# Patient Record
Sex: Female | Born: 1960 | Race: Black or African American | Hispanic: No | Marital: Single | Smoking: Current some day smoker
Health system: Southern US, Community
[De-identification: ages and names within clinical notes are randomized; demographics above are authoritative.]

## PROBLEM LIST (undated history)

## (undated) DIAGNOSIS — K429 Umbilical hernia without obstruction or gangrene: Secondary | ICD-10-CM

## (undated) DIAGNOSIS — Z860100 Personal history of colon polyps, unspecified: Secondary | ICD-10-CM

## (undated) DIAGNOSIS — F32A Depression, unspecified: Secondary | ICD-10-CM

## (undated) DIAGNOSIS — K2980 Duodenitis without bleeding: Secondary | ICD-10-CM

## (undated) DIAGNOSIS — G893 Neoplasm related pain (acute) (chronic): Secondary | ICD-10-CM

## (undated) DIAGNOSIS — Z809 Family history of malignant neoplasm, unspecified: Secondary | ICD-10-CM

## (undated) DIAGNOSIS — N390 Urinary tract infection, site not specified: Secondary | ICD-10-CM

## (undated) DIAGNOSIS — I10 Essential (primary) hypertension: Secondary | ICD-10-CM

## (undated) DIAGNOSIS — E7800 Pure hypercholesterolemia, unspecified: Secondary | ICD-10-CM

## (undated) DIAGNOSIS — R7303 Prediabetes: Secondary | ICD-10-CM

## (undated) DIAGNOSIS — C801 Malignant (primary) neoplasm, unspecified: Secondary | ICD-10-CM

## (undated) DIAGNOSIS — R0600 Dyspnea, unspecified: Secondary | ICD-10-CM

## (undated) HISTORY — DX: Personal history of colonic polyps: Z86.010

## (undated) HISTORY — PX: LUNG LOBECTOMY: SHX167

## (undated) HISTORY — DX: Neoplasm related pain (acute) (chronic): G89.3

## (undated) HISTORY — DX: Personal history of colon polyps, unspecified: Z86.0100

## (undated) HISTORY — DX: Family history of malignant neoplasm, unspecified: Z80.9

---
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
## 2004-08-10 ENCOUNTER — Emergency Department: Payer: Self-pay | Admitting: Emergency Medicine

## 2004-09-20 ENCOUNTER — Observation Stay: Payer: Self-pay | Admitting: Obstetrics and Gynecology

## 2011-02-24 ENCOUNTER — Emergency Department: Payer: Self-pay | Admitting: Emergency Medicine

## 2011-11-04 ENCOUNTER — Emergency Department: Payer: Self-pay | Admitting: Unknown Physician Specialty

## 2012-06-14 ENCOUNTER — Emergency Department: Payer: Self-pay | Admitting: Emergency Medicine

## 2015-03-05 ENCOUNTER — Emergency Department
Admission: EM | Admit: 2015-03-05 | Discharge: 2015-03-05 | Disposition: A | Discharge status: Home / Self Care | Payer: Self-pay | Attending: Emergency Medicine | Admitting: Emergency Medicine

## 2015-03-05 ENCOUNTER — Encounter: Payer: Self-pay | Admitting: *Deleted

## 2015-03-05 DIAGNOSIS — Z720 Tobacco use: Secondary | ICD-10-CM | POA: Insufficient documentation

## 2015-03-05 DIAGNOSIS — L02416 Cutaneous abscess of left lower limb: Secondary | ICD-10-CM | POA: Insufficient documentation

## 2015-03-05 DIAGNOSIS — R300 Dysuria: Secondary | ICD-10-CM | POA: Insufficient documentation

## 2015-03-05 LAB — URINALYSIS COMPLETE WITH MICROSCOPIC (ARMC ONLY)
Bacteria, UA: NONE SEEN
Bilirubin Urine: NEGATIVE
Glucose, UA: NEGATIVE mg/dL
Ketones, ur: NEGATIVE mg/dL
Nitrite: NEGATIVE
Protein, ur: NEGATIVE mg/dL
Specific Gravity, Urine: 1.027 (ref 1.005–1.030)
pH: 5 (ref 5.0–8.0)

## 2015-03-05 MED ORDER — SULFAMETHOXAZOLE-TRIMETHOPRIM 800-160 MG PO TABS
ORAL_TABLET | ORAL | Status: AC
  Filled 2015-03-05: qty 1

## 2015-03-05 MED ORDER — PHENAZOPYRIDINE HCL 200 MG PO TABS: 200.0000 mg | ORAL_TABLET | Freq: Three times a day (TID) | ORAL | Status: DC | PRN

## 2015-03-05 MED ORDER — PHENAZOPYRIDINE HCL 100 MG PO TABS
95.0000 mg | ORAL_TABLET | Freq: Once | ORAL | Status: AC
  Administered 2015-03-05: 100 mg via ORAL

## 2015-03-05 MED ORDER — IBUPROFEN 800 MG PO TABS
800.0000 mg | ORAL_TABLET | Freq: Once | ORAL | Status: AC
  Administered 2015-03-05: 800 mg via ORAL

## 2015-03-05 MED ORDER — IBUPROFEN 800 MG PO TABS
ORAL_TABLET | ORAL | Status: AC
  Filled 2015-03-05: qty 1

## 2015-03-05 MED ORDER — PHENAZOPYRIDINE HCL 200 MG PO TABS
ORAL_TABLET | ORAL | Status: AC
  Filled 2015-03-05: qty 1

## 2015-03-05 MED ORDER — BACITRACIN ZINC 500 UNIT/GM EX OINT: TOPICAL_OINTMENT | CUTANEOUS | Status: DC

## 2015-03-05 MED ORDER — SULFAMETHOXAZOLE-TRIMETHOPRIM 800-160 MG PO TABS
1.0000 | ORAL_TABLET | Freq: Once | ORAL | Status: AC
  Administered 2015-03-05: 1 via ORAL

## 2015-03-05 MED ORDER — IBUPROFEN 800 MG PO TABS: 800.0000 mg | ORAL_TABLET | Freq: Three times a day (TID) | ORAL | Status: DC | PRN

## 2015-03-05 MED ORDER — SULFAMETHOXAZOLE-TRIMETHOPRIM 800-160 MG PO TABS: 1.0000 | ORAL_TABLET | Freq: Two times a day (BID) | ORAL | Status: DC

## 2015-03-05 NOTE — ED Provider Notes (Signed)
CSN: [id number]     Arrival date & time 03/05/15  1421 History   First MD Initiated Contact with Patient 03/05/15 1452     Chief Complaint  Patient presents with  . Dysuria  . Abscess     (Consider location/radiation/quality/duration/timing/severity/associated sxs/prior Treatment) HPI Patient presents with abscess to left shin she first noticed approximately 3 days ago said it spontaneously ruptured and drained not areas are red and tender denies fevers or chills states that she is also experiencing some burning with urination that is symptomatic of previous urinary tract infections that she has had denies any vaginal bleeding vaginal discharge or pelvic pain flank pain overall rates her pain as about a 5 out of 10 worse when she touches her shin and has no other complaints at this time History reviewed. No pertinent past medical history. History reviewed. No pertinent past surgical history. History reviewed. No pertinent family history. History  Substance Use Topics  . Smoking status: Current Every Day Smoker  . Smokeless tobacco: Not on file  . Alcohol Use: Not on file   OB History    No data available     Review of Systems  Constitutional: Negative.   HENT: Negative.   Eyes: Negative.   Respiratory: Negative.   Cardiovascular: Negative.   Gastrointestinal: Negative.   Genitourinary: Positive for dysuria, urgency, frequency and difficulty urinating. Negative for hematuria, flank pain, vaginal bleeding, vaginal discharge, vaginal pain, menstrual problem and pelvic pain.  Musculoskeletal: Negative.   Skin: Positive for wound.  Neurological: Negative.   All other systems reviewed and are negative.      Allergies  Review of patient's allergies indicates no known allergies.  Home Medications   Prior to Admission medications   Medication Sig Start Date End Date Taking? Authorizing Provider  bacitracin ointment Apply to affected area daily 03/05/15 03/04/16  Emogene Muratalla William C  Kaion Tisdale, PA-C  ibuprofen (ADVIL,MOTRIN) 800 MG tablet Take 1 tablet (800 mg total) by mouth every 8 (eight) hours as needed. 03/05/15   Emmilee Reamer Luanna Cole Arlyne Brandes, PA-C  phenazopyridine (PYRIDIUM) 200 MG tablet Take 1 tablet (200 mg total) by mouth 3 (three) times daily as needed for pain. 03/05/15 03/04/16  Fidencio Duddy William C Lovely Kerins, PA-C  sulfamethoxazole-trimethoprim (BACTRIM DS,SEPTRA DS) 800-160 MG per tablet Take 1 tablet by mouth 2 (two) times daily. 03/05/15   Rocco Kerkhoff William C Shonn Farruggia, PA-C   BP 148/83 mmHg  Pulse 95  Temp(Src) 98.8 F (37.1 C) (Oral)  Resp 18  Ht '5\' 8"'$  (1.727 m)  Wt 150 lb (68.04 kg)  BMI 22.81 kg/m2  SpO2 99% Physical Exam Female whose appearing stated age well-developed well-nourished no acute distress Vitals reviewed Head ears eyes nose neck and throat exam in this patient unremarkable Cardiovascular regular rate and rhythm no murmurs rubs gallops Pulmonary lungs clear to auscultation bilaterally Abdomen soft flat nontender bowel sounds positive in all 4 quadrants Musculoskeletal moving all extremities without difficulty Neuro exam is nonfocal cranial nerves II through XII grossly intact good sensation throughout distal extremities Skin patient has a soft tissue lesion lateral aspect of her left shin with mild erythema no active drainage mild induration no fluctuance ED Course  Procedures (including critical care time) Labs Review Labs Reviewed  URINALYSIS COMPLETEWITH MICROSCOPIC  Hospital)  - Abnormal; Notable for the following:    Color, Urine YELLOW (*)    APPearance CLEAR (*)    Hgb urine dipstick 2+ (*)    Leukocytes, UA 1+ (*)    Squamous Epithelial /  LPF 0-5 (*)    All other components within normal limits  URINE CULTURE      MDM  Patient has an abscess that has spontaneously ruptured to her left shin will start her on Bactrim she was also complaining of dysuria Bactrim should generally cover if she has a urinary tract infection though we did send her  urine for culture and started her on Pyridium and Motrin also she is to return to 3 days as needed for a wound check if not improving return immediately for any acute concerns or worsening symptoms Final diagnoses:  Abscess of leg, left  Dysuria       Bentlee Benningfield Verdene Rio, PA-C 03/05/15 1544  Orbie Pyo, MD 03/05/15 2358

## 2015-03-05 NOTE — ED Notes (Signed)
Pt arrives with quarter size abseces on her left shin with pus, pt states she noticied it 3 days ago, pt also states burning upon urination

## 2015-03-05 NOTE — Discharge Instructions (Signed)

## 2015-03-07 LAB — URINE CULTURE: Culture: NO GROWTH

## 2015-03-13 ENCOUNTER — Encounter: Payer: Self-pay | Admitting: Emergency Medicine

## 2015-03-13 ENCOUNTER — Emergency Department
Admission: EM | Admit: 2015-03-13 | Discharge: 2015-03-13 | Disposition: A | Discharge status: Home / Self Care | Payer: Self-pay | Attending: Emergency Medicine | Admitting: Emergency Medicine

## 2015-03-13 DIAGNOSIS — Z720 Tobacco use: Secondary | ICD-10-CM | POA: Insufficient documentation

## 2015-03-13 DIAGNOSIS — T814XXD Infection following a procedure, subsequent encounter: Secondary | ICD-10-CM

## 2015-03-13 DIAGNOSIS — L02416 Cutaneous abscess of left lower limb: Secondary | ICD-10-CM | POA: Insufficient documentation

## 2015-03-13 DIAGNOSIS — IMO0001 Reserved for inherently not codable concepts without codable children: Secondary | ICD-10-CM

## 2015-03-13 MED ORDER — DOXYCYCLINE HYCLATE 100 MG PO CAPS: 100.0000 mg | ORAL_CAPSULE | Freq: Two times a day (BID) | ORAL | Status: AC

## 2015-03-13 NOTE — ED Provider Notes (Signed)
CSN: [id number]     Arrival date & time 03/13/15  1102 History   First MD Initiated Contact with Patient 03/13/15 1147     Chief Complaint  Patient presents with  . Wound Check     HPI Comments: 54 year old female presents today complaining of left leg wound. She states that she was seen here on May 25 for the same complaint and prescribed Bactrim. She has been taking this without relief. At one point there was purulent drainage from the wound. No fevers, sweats or chills.   Patient is a 54 y.o. female presenting with wound check. The history is provided by the patient.  Wound Check The current episode started in the past 7 days. The problem occurs constantly. The problem has been unchanged. Pertinent negatives include no arthralgias, chills, fatigue, joint swelling or myalgias. The symptoms are aggravated by walking. She has tried rest (antibiotic cream) for the symptoms. The treatment provided no relief.    History reviewed. No pertinent past medical history. History reviewed. No pertinent past surgical history. No family history on file. History  Substance Use Topics  . Smoking status: Current Every Day Smoker  . Smokeless tobacco: Not on file  . Alcohol Use: Yes     Comment: occssional   OB History    No data available     Review of Systems  Constitutional: Negative for chills and fatigue.  Musculoskeletal: Negative for myalgias, joint swelling and arthralgias.  Skin: Positive for wound.  All other systems reviewed and are negative.     Allergies  Review of patient's allergies indicates no known allergies.  Home Medications   Prior to Admission medications   Medication Sig Start Date End Date Taking? Authorizing Provider  doxycycline (VIBRAMYCIN) 100 MG capsule Take 1 capsule (100 mg total) by mouth 2 (two) times daily. 03/13/15 03/23/15  Shayne Alken V, PA-C   BP 113/67 mmHg  Pulse 72  Temp(Src) 98.1 F (36.7 C) (Oral)  Resp 18  Ht '5\' 7"'$  (1.702 m)  Wt 150 lb  (68.04 kg)  BMI 23.49 kg/m2  SpO2 98% Physical Exam  Constitutional: She is oriented to person, place, and time. Vital signs are normal. She appears well-developed and well-nourished.  HENT:  Head: Normocephalic and atraumatic.  Musculoskeletal: Normal range of motion.  Neurological: She is alert and oriented to person, place, and time.  Skin:  Open wound overlying the left anterior tibial spine. It is approximately 2 cm in diameter. There is a small amount of drainage from the wound.   Psychiatric: She has a normal mood and affect. Her behavior is normal. Judgment and thought content normal.  Nursing note and vitals reviewed.   ED Course  Procedures (including critical care time) I packed the wound with iodoform gauze and covered with sterile gauze.  Labs Review Labs Reviewed - No data to display  Imaging Review No results found.   EKG Interpretation None      MDM  I packed the wound as above. I advised the patient to continue packing this each day. If it does not heal she should come back in the next week. She should also return if it is worsening. Stop bactrim, start doxycycline. Advised of potential for photosensitivity with this medication.  Final diagnoses:  Wound abscess, subsequent encounter        Corliss Parish, PA-C 03/13/15 1415  Harvest Dark, MD 03/13/15 [phone]

## 2015-03-13 NOTE — Discharge Instructions (Signed)
Pack wound with antibiotic gauze and change dressing once each day STOP taking bactrim, start doxycycline Return to ER if wound is not getting better, or it seems to be getting worse

## 2015-03-13 NOTE — ED Notes (Signed)
Pt informed to return if any life threatening symptoms occur.  

## 2015-03-13 NOTE — ED Notes (Signed)
Here for wound   Was seen last week

## 2015-03-14 ENCOUNTER — Telehealth: Payer: Self-pay | Admitting: Emergency Medicine

## 2017-08-11 ENCOUNTER — Emergency Department
Admission: EM | Admit: 2017-08-11 | Discharge: 2017-08-11 | Disposition: A | Discharge status: Home / Self Care | Payer: 59 | Attending: Emergency Medicine | Admitting: Emergency Medicine

## 2017-08-11 ENCOUNTER — Encounter: Payer: Self-pay | Admitting: Emergency Medicine

## 2017-08-11 DIAGNOSIS — Y999 Unspecified external cause status: Secondary | ICD-10-CM | POA: Diagnosis not present

## 2017-08-11 DIAGNOSIS — K0889 Other specified disorders of teeth and supporting structures: Secondary | ICD-10-CM | POA: Diagnosis present

## 2017-08-11 DIAGNOSIS — K0510 Chronic gingivitis, plaque induced: Secondary | ICD-10-CM | POA: Diagnosis not present

## 2017-08-11 DIAGNOSIS — Y939 Activity, unspecified: Secondary | ICD-10-CM | POA: Insufficient documentation

## 2017-08-11 DIAGNOSIS — Y929 Unspecified place or not applicable: Secondary | ICD-10-CM | POA: Insufficient documentation

## 2017-08-11 DIAGNOSIS — X58XXXA Exposure to other specified factors, initial encounter: Secondary | ICD-10-CM | POA: Insufficient documentation

## 2017-08-11 DIAGNOSIS — K029 Dental caries, unspecified: Secondary | ICD-10-CM

## 2017-08-11 DIAGNOSIS — F17200 Nicotine dependence, unspecified, uncomplicated: Secondary | ICD-10-CM | POA: Insufficient documentation

## 2017-08-11 DIAGNOSIS — S025XXA Fracture of tooth (traumatic), initial encounter for closed fracture: Secondary | ICD-10-CM

## 2017-08-11 DIAGNOSIS — K047 Periapical abscess without sinus: Secondary | ICD-10-CM

## 2017-08-11 MED ORDER — LIDOCAINE VISCOUS 2 % MT SOLN
15.0000 mL | Freq: Once | OROMUCOSAL | Status: AC
  Administered 2017-08-11: 15 mL via OROMUCOSAL
  Filled 2017-08-11: qty 15

## 2017-08-11 MED ORDER — LIDOCAINE VISCOUS 2 % MT SOLN: 5.0000 mL | Freq: Four times a day (QID) | OROMUCOSAL | 0 refills | Status: DC | PRN

## 2017-08-11 MED ORDER — HYDROXYZINE HCL 50 MG PO TABS: 50.0000 mg | ORAL_TABLET | Freq: Three times a day (TID) | ORAL | 0 refills | Status: DC | PRN

## 2017-08-11 MED ORDER — TRAMADOL HCL 50 MG PO TABS: 50.0000 mg | ORAL_TABLET | Freq: Four times a day (QID) | ORAL | 0 refills | Status: DC | PRN

## 2017-08-11 MED ORDER — AMOXICILLIN 500 MG PO CAPS: 500.0000 mg | ORAL_CAPSULE | Freq: Three times a day (TID) | ORAL | 0 refills | Status: DC

## 2017-08-11 NOTE — Discharge Instructions (Signed)
Following that were treating dentist for extraction.

## 2017-08-11 NOTE — ED Provider Notes (Signed)
[facility] Emergency Department Provider Note   ____________________________________________   First MD Initiated Contact with Patient 08/11/17 1149     (approximate)  I have reviewed the triage vital signs and the nursing notes.   HISTORY  Chief Complaint Oral Swelling and Dental Pain    HPI Stacie Kennedy is a 56 y.o. female patient complaining of dental pain and upper lip swelling for 2 days. Patient states he was revived by the dentist 2 days ago but deferred extraction secondary to lack of funds. Patient states that is post examination she went home and noticed upper lip swelling. Patient also states decreased redness in her gums and increased teeth pain.Patient rates pain as a 5/10. Patient has fever associated this complaint. No palliative measures for this complaint.   History reviewed. No pertinent past medical history.  There are no active problems to display for this patient.   History reviewed. No pertinent surgical history.  Prior to Admission medications   Medication Sig Start Date End Date Taking? Authorizing Provider  amoxicillin (AMOXIL) 500 MG capsule Take 1 capsule (500 mg total) by mouth 3 (three) times daily. 08/11/17   Sable Feil, PA-C  hydrOXYzine (ATARAX/VISTARIL) 50 MG tablet Take 1 tablet (50 mg total) by mouth 3 (three) times daily as needed for itching. 08/11/17   Sable Feil, PA-C  lidocaine (XYLOCAINE) 2 % solution Use as directed 5 mLs in the mouth or throat every 6 (six) hours as needed for mouth pain. 08/11/17   Sable Feil, PA-C  traMADol (ULTRAM) 50 MG tablet Take 1 tablet (50 mg total) by mouth every 6 (six) hours as needed. 08/11/17 08/11/18  Sable Feil, PA-C    Allergies Patient has no known allergies.  History reviewed. No pertinent family history.  Social History Social History  Substance Use Topics  . Smoking status: Current Every Day Smoker  . Smokeless tobacco: Never Used  . Alcohol use  Yes     Comment: occssional    Review of Systems Constitutional: No fever/chills Eyes: No visual changes. ENT: No sore throat. Dental pain  Cardiovascular: Denies chest pain. Respiratory: Denies shortness of breath. Gastrointestinal: No abdominal pain.  No nausea, no vomiting.  No diarrhea.  No constipation. Genitourinary: Negative for dysuria. Musculoskeletal: Negative for back pain. Skin: Negative for rash. Neurological: Negative for headaches, focal weakness or numbness.   ____________________________________________   PHYSICAL EXAM:  VITAL SIGNS: ED Triage Vitals [08/11/17 1129]  Enc Vitals Group     BP (!) 148/71     Pulse Rate 72     Resp 18     Temp 97.7 F (36.5 C)     Temp Source Oral     SpO2 96 %     Weight 130 lb (59 kg)     Height 5\' 7"  (1.702 m)     Head Circumference      Peak Flow      Pain Score      Pain Loc      Pain Edu?      Excl. in [place]?     Constitutional: Alert and oriented. Well appearing and in no acute distress. Mouth/Throat: Mucous membranes are moist.  Oropharynx non-erythematous.Edema and erythema to the upper gingiva.multiple caries anterior upper incisors. Devitalized (sizes. Neck: No stridor.   Cardiovascular: Normal rate, regular rhythm. Grossly normal heart sounds.  Good peripheral circulation. Respiratory: Normal respiratory effort.  No retractions. Lungs CTAB. Neurologic:  Normal speech and language. No gross  focal neurologic deficits are appreciated. No gait instability. Skin:  Skin is warm, dry and intact. No rash noted. Psychiatric: Mood and affect are normal. Speech and behavior are normal.  ____________________________________________   LABS (all labs ordered are listed, but only abnormal results are displayed)  Labs Reviewed - No data to display ____________________________________________  EKG   ____________________________________________  RADIOLOGY  No results  found.  ____________________________________________   PROCEDURES  Procedure(s) performed: None  Procedures  Critical Care performed: No  ____________________________________________   INITIAL IMPRESSION / ASSESSMENT AND PLAN / ED COURSE  As part of my medical decision making, I reviewed the following data within the [facility]    Today secondary to gingivitis and devitalized upper incisors. Patient given discharge care instruction. Patient advised follow with treating dentist for extraction. Patient was take medication as directed. Patient given a work note.       ____________________________________________   FINAL CLINICAL IMPRESSION(S) / ED DIAGNOSES  Final diagnoses:  Infected dental caries  Closed fracture of tooth, initial encounter  Gingivitis      NEW MEDICATIONS STARTED DURING THIS VISIT:  New Prescriptions   AMOXICILLIN (AMOXIL) 500 MG CAPSULE    Take 1 capsule (500 mg total) by mouth 3 (three) times daily.   HYDROXYZINE (ATARAX/VISTARIL) 50 MG TABLET    Take 1 tablet (50 mg total) by mouth 3 (three) times daily as needed for itching.   LIDOCAINE (XYLOCAINE) 2 % SOLUTION    Use as directed 5 mLs in the mouth or throat every 6 (six) hours as needed for mouth pain.   TRAMADOL (ULTRAM) 50 MG TABLET    Take 1 tablet (50 mg total) by mouth every 6 (six) hours as needed.     Note:  This document was prepared using Dragon voice recognition software and may include unintentional dictation errors.    Sable Feil, PA-C 08/11/17 1202    Eula Listen, MD 08/11/17 1525

## 2017-08-11 NOTE — ED Triage Notes (Signed)
Pt has mild swelling upper lip X 2 days. Has not gotten worse. Saw dentist for problems with front upper tooth Tuesday and he told her take motrin but then started having swelling. Handling secretions. No respiratory distress.  Increased pain to tooth. Pt thinks may need abx for tooth.

## 2017-08-11 NOTE — ED Notes (Signed)
Pt reports swellign to upper lip, states she went to the dentist on Tuesday and was prescribed pain medications. Pt states the pain is not getting any better and reports burning in her upper mouth.

## 2018-04-03 ENCOUNTER — Emergency Department
Admission: EM | Admit: 2018-04-03 | Discharge: 2018-04-03 | Disposition: A | Discharge status: Home / Self Care | Payer: 59 | Attending: Emergency Medicine | Admitting: Emergency Medicine

## 2018-04-03 ENCOUNTER — Other Ambulatory Visit: Payer: Self-pay

## 2018-04-03 ENCOUNTER — Encounter: Payer: Self-pay | Admitting: Emergency Medicine

## 2018-04-03 DIAGNOSIS — F17200 Nicotine dependence, unspecified, uncomplicated: Secondary | ICD-10-CM | POA: Diagnosis not present

## 2018-04-03 DIAGNOSIS — K047 Periapical abscess without sinus: Secondary | ICD-10-CM | POA: Diagnosis not present

## 2018-04-03 DIAGNOSIS — R220 Localized swelling, mass and lump, head: Secondary | ICD-10-CM | POA: Diagnosis present

## 2018-04-03 MED ORDER — NAPROXEN 500 MG PO TABS: 500.0000 mg | ORAL_TABLET | Freq: Two times a day (BID) | ORAL | 0 refills | Status: DC

## 2018-04-03 MED ORDER — CLINDAMYCIN HCL 150 MG PO CAPS: ORAL_CAPSULE | ORAL | 0 refills | Status: DC

## 2018-04-03 NOTE — ED Notes (Signed)
See triage note   Presents with swelling to lips  States she did have dental pain couple of days ago   Not sure if lip swelling is coming from tooth pain  No diff swallowing   Denies any recent meds or new foods

## 2018-04-03 NOTE — Discharge Instructions (Addendum)
Follow-up with your dentist for future dental procedures.  Begin taking clindamycin for the next 7 days.  You must finish the antibiotic completely.  Also naproxen 500 mg twice daily with food.  Discontinue taking the amoxicillin.

## 2018-04-03 NOTE — ED Provider Notes (Signed)
[facility] Emergency Department Provider Note   ____________________________________________   First MD Initiated Contact with Patient 04/03/18 1329     (approximate)  I have reviewed the triage vital signs and the nursing notes.   HISTORY  Chief Complaint lips swelling  HPI Stacie Kennedy is a 57 y.o. female is here with complaint of upper lip swelling.  Patient denies any injury.  She attributes this to a dental problem that she has.  Patient has undergone dental procedures and plans to schedule more extractions by the end of the year.  She denies any fever or chills.  Patient states that the tooth in this area has been chipped for approximately 2 years.  Patient smokes half pack of cigarettes daily.  Patient began taking leftover amoxicillin last evening.   History reviewed. No pertinent past medical history.  There are no active problems to display for this patient.   History reviewed. No pertinent surgical history.  Prior to Admission medications   Medication Sig Start Date End Date Taking? Authorizing Provider  clindamycin (CLEOCIN) 150 MG capsule 1 tablet qid x 7days 04/03/18   Letitia Neri L, PA-C  naproxen (NAPROSYN) 500 MG tablet Take 1 tablet (500 mg total) by mouth 2 (two) times daily with a meal. 04/03/18   Johnn Hai, PA-C    Allergies Patient has no known allergies.  No family history on file.  Social History Social History   Tobacco Use  . Smoking status: Current Every Day Smoker  . Smokeless tobacco: Never Used  Substance Use Topics  . Alcohol use: Yes    Comment: occssional  . Drug use: No    Review of Systems Constitutional: No fever/chills Eyes: No visual changes. ENT: Positive dental pain. Cardiovascular: Denies chest pain. Respiratory: Denies shortness of breath. Musculoskeletal: Negative for back pain. Skin: Right upper lip edema. Neurological: Negative for headaches, focal weakness or  numbness. ____________________________________________   PHYSICAL EXAM:  VITAL SIGNS: ED Triage Vitals  Enc Vitals Group     BP 04/03/18 1226 132/83     Pulse Rate 04/03/18 1226 71     Resp 04/03/18 1226 18     Temp 04/03/18 1226 98.5 F (36.9 C)     Temp Source 04/03/18 1226 Oral     SpO2 04/03/18 1226 95 %     Weight 04/03/18 1237 135 lb (61.2 kg)     Height 04/03/18 1237 5' 6.5" (1.689 m)     Head Circumference --      Peak Flow --      Pain Score 04/03/18 1237 0     Pain Loc --      Pain Edu? --      Excl. in [place]? --    Constitutional: Alert and oriented. Well appearing and in no acute distress. Eyes: Conjunctivae are normal.  Head: Atraumatic. Nose: No congestion/rhinnorhea. Mouth/Throat: Mucous membranes are moist.  Oropharynx non-erythematous.  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  Poor mentation and hygiene.  There is soft tissue edema around the tooth.  No obvious abscess or drainage was noted. Neck: No stridor.   Hematological/Lymphatic/Immunilogical: No cervical lymphadenopathy. Cardiovascular: Normal rate, regular rhythm. Grossly normal heart sounds.  Good peripheral circulation. Respiratory: Normal respiratory effort.  No retractions. Lungs CTAB. Musculoskeletal: Moves upper and lower extremities without any difficulty.  Normal gait was noted. Neurologic:  Normal speech and language. No gross focal neurologic deficits are appreciated.  Skin:  Skin is warm, dry and intact.  Psychiatric: Mood and affect are normal. Speech and behavior are normal.  ____________________________________________   LABS (all labs ordered are listed, but only abnormal results are displayed)  Labs Reviewed - No data to display  PROCEDURES  Procedure(s) performed: None  Procedures  Critical Care performed: No  ____________________________________________   INITIAL IMPRESSION / ASSESSMENT AND PLAN / ED COURSE  As part of my medical decision making, I reviewed the  following data within the electronic MEDICAL RECORD NUMBER Notes from prior ED visits and [place] Controlled Substance Database  Patient plans to have more extractions done by the end of the year.  Patient will discontinue taking amoxicillin at this time.  Patient was given a prescription for clindamycin to take for the next 7 days along with naproxen 500 mg twice daily with food.   FINAL CLINICAL IMPRESSION(S) / ED DIAGNOSES  Final diagnoses:  Dental abscess     ED Discharge Orders        Ordered    clindamycin (CLEOCIN) 150 MG capsule     04/03/18 1400    naproxen (NAPROSYN) 500 MG tablet  2 times daily with meals     04/03/18 1400       Note:  This document was prepared using Dragon voice recognition software and may include unintentional dictation errors.    Johnn Hai, PA-C 04/03/18 1458    Earleen Newport, MD 04/03/18 [phone]

## 2018-04-03 NOTE — ED Triage Notes (Signed)
C/O lips swelling. Onset of symptoms last night.  Lungs CTA. Denies c/o SOB.

## 2018-04-10 ENCOUNTER — Emergency Department
Admission: EM | Admit: 2018-04-10 | Discharge: 2018-04-10 | Disposition: A | Discharge status: Home / Self Care | Payer: 59 | Attending: Emergency Medicine | Admitting: Emergency Medicine

## 2018-04-10 ENCOUNTER — Encounter: Payer: Self-pay | Admitting: Emergency Medicine

## 2018-04-10 ENCOUNTER — Emergency Department: Payer: 59

## 2018-04-10 ENCOUNTER — Other Ambulatory Visit: Payer: Self-pay

## 2018-04-10 DIAGNOSIS — F17200 Nicotine dependence, unspecified, uncomplicated: Secondary | ICD-10-CM | POA: Insufficient documentation

## 2018-04-10 DIAGNOSIS — N3001 Acute cystitis with hematuria: Secondary | ICD-10-CM | POA: Diagnosis not present

## 2018-04-10 DIAGNOSIS — Z79899 Other long term (current) drug therapy: Secondary | ICD-10-CM | POA: Diagnosis not present

## 2018-04-10 DIAGNOSIS — R109 Unspecified abdominal pain: Secondary | ICD-10-CM | POA: Diagnosis present

## 2018-04-10 LAB — COMPREHENSIVE METABOLIC PANEL
ALT: 11 U/L (ref 0–44)
AST: 21 U/L (ref 15–41)
Albumin: 4.1 g/dL (ref 3.5–5.0)
Alkaline Phosphatase: 110 U/L (ref 38–126)
Anion gap: 10 (ref 5–15)
BUN: 17 mg/dL (ref 6–20)
CO2: 27 mmol/L (ref 22–32)
Calcium: 9.9 mg/dL (ref 8.9–10.3)
Chloride: 102 mmol/L (ref 98–111)
Creatinine, Ser: 0.64 mg/dL (ref 0.44–1.00)
GFR calc Af Amer: >60 mL/min (ref >60)
GFR calc non Af Amer: >60 mL/min (ref >60)
Glucose, Bld: 101 mg/dL — ABNORMAL HIGH (ref 70–99)
Potassium: 4.1 mmol/L (ref 3.5–5.1)
Sodium: 139 mmol/L (ref 135–145)
Total Bilirubin: 0.6 mg/dL (ref 0.3–1.2)
Total Protein: 7.9 g/dL (ref 6.5–8.1)

## 2018-04-10 LAB — CBC
HCT: 47.1 % — ABNORMAL HIGH (ref 35.0–47.0)
Hemoglobin: 15.6 g/dL (ref 12.0–16.0)
MCH: 29.4 pg (ref 26.0–34.0)
MCHC: 33 g/dL (ref 32.0–36.0)
MCV: 88.9 fL (ref 80.0–100.0)
Platelets: 355 10*3/uL (ref 150–440)
RBC: 5.3 MIL/uL — ABNORMAL HIGH (ref 3.80–5.20)
RDW: 15.2 % — ABNORMAL HIGH (ref 11.5–14.5)
WBC: 8.5 10*3/uL (ref 3.6–11.0)

## 2018-04-10 LAB — URINALYSIS, COMPLETE (UACMP) WITH MICROSCOPIC
Bilirubin Urine: NEGATIVE
Glucose, UA: NEGATIVE mg/dL
Ketones, ur: NEGATIVE mg/dL
Nitrite: POSITIVE — AB
Protein, ur: 30 mg/dL — AB
RBC / HPF: >50 RBC/hpf — ABNORMAL HIGH (ref 0–5)
Specific Gravity, Urine: 1.012 (ref 1.005–1.030)
WBC, UA: >50 WBC/hpf — ABNORMAL HIGH (ref 0–5)
pH: 5 (ref 5.0–8.0)

## 2018-04-10 LAB — LIPASE, BLOOD: Lipase: 22 U/L (ref 11–51)

## 2018-04-10 MED ORDER — MORPHINE SULFATE (PF) 4 MG/ML IV SOLN: 4.0000 mg | Freq: Once | INTRAVENOUS | Status: DC

## 2018-04-10 MED ORDER — LIDOCAINE HCL (PF) 1 % IJ SOLN
5.0000 mL | Freq: Once | INTRAMUSCULAR | Status: AC
  Administered 2018-04-10: 2.1 mL

## 2018-04-10 MED ORDER — LIDOCAINE HCL (PF) 1 % IJ SOLN
INTRAMUSCULAR | Status: AC
  Administered 2018-04-10: 2.1 mL
  Filled 2018-04-10: qty 5

## 2018-04-10 MED ORDER — CEFTRIAXONE SODIUM 1 G IJ SOLR
1.0000 g | Freq: Once | INTRAMUSCULAR | Status: AC
  Administered 2018-04-10: 1 g via INTRAMUSCULAR
  Filled 2018-04-10: qty 10

## 2018-04-10 MED ORDER — CEPHALEXIN 500 MG PO CAPS: 500.0000 mg | ORAL_CAPSULE | Freq: Three times a day (TID) | ORAL | 0 refills | Status: AC

## 2018-04-10 MED ORDER — ONDANSETRON HCL 4 MG/2ML IJ SOLN: 4.0000 mg | Freq: Once | INTRAMUSCULAR | Status: DC

## 2018-04-10 MED ORDER — KETOROLAC TROMETHAMINE 60 MG/2ML IM SOLN
60.0000 mg | Freq: Once | INTRAMUSCULAR | Status: AC
  Administered 2018-04-10: 60 mg via INTRAMUSCULAR
  Filled 2018-04-10: qty 2

## 2018-04-10 NOTE — ED Notes (Signed)
Pt taken to CT at this time.

## 2018-04-10 NOTE — ED Triage Notes (Addendum)
Pt with right lower abd pain with painful urination that started Thursday. Pt with hx of uti

## 2018-04-10 NOTE — Discharge Instructions (Addendum)

## 2018-04-10 NOTE — ED Provider Notes (Signed)
[facility] Emergency Department Provider Note  ____________________________________________  Time seen: Approximately 6:34 PM  I have reviewed the triage vital signs and the nursing notes.   HISTORY  Chief Complaint Abdominal Pain   HPI Stacie Kennedy is a 57 y.o. female with no significant past medical history who presents for evaluation of abdominal pain.  Patient reports that the pain started 2 days ago but it became severe today.  The pain is sharp, located in the left side of her abdomen, constant and nonradiating.  Patient reports that when she finishes urinating the pain gets worse.  She denies dysuria or frequency.  She reports similar pain in the past in the setting of a urinary tract infection.  At this time she reports that her pain is severe.  No fever, no chills, no nausea, no vomiting, no constipation, no diarrhea, no chest pain, no vaginal discharge, no shortness of breath.  Patient denies any prior abdominal surgeries.  PMH None - reviewed  Prior to Admission medications   Medication Sig Start Date End Date Taking? Authorizing Provider  cephALEXin (KEFLEX) 500 MG capsule Take 1 capsule (500 mg total) by mouth 3 (three) times daily for 7 days. 04/10/18 04/17/18  Rudene Re, MD  clindamycin (CLEOCIN) 150 MG capsule 1 tablet qid x 7days 04/03/18   Letitia Neri L, PA-C  naproxen (NAPROSYN) 500 MG tablet Take 1 tablet (500 mg total) by mouth 2 (two) times daily with a meal. 04/03/18   Johnn Hai, PA-C    Allergies Patient has no known allergies.  PMH No history of cancer  Social History Social History   Tobacco Use  . Smoking status: Current Every Day Smoker  . Smokeless tobacco: Never Used  Substance Use Topics  . Alcohol use: Yes    Comment: occssional  . Drug use: No    Review of Systems  Constitutional: Negative for fever. Eyes: Negative for visual changes. ENT: Negative for sore throat. Neck: No neck pain    Cardiovascular: Negative for chest pain. Respiratory: Negative for shortness of breath. Gastrointestinal: + left sided abdominal pain. No vomiting or diarrhea. Genitourinary: Negative for dysuria. Musculoskeletal: Negative for back pain. Skin: Negative for rash. Neurological: Negative for headaches, weakness or numbness. Psych: No SI or HI  ____________________________________________   PHYSICAL EXAM:  VITAL SIGNS: ED Triage Vitals  Enc Vitals Group     BP 04/10/18 1748 (!) 175/79     Pulse Rate 04/10/18 1748 98     Resp --      Temp 04/10/18 1748 98.1 F (36.7 C)     Temp Source 04/10/18 1748 Oral     SpO2 04/10/18 1748 95 %     Weight 04/10/18 1749 135 lb (61.2 kg)     Height 04/10/18 1749 5' 7.5" (1.715 m)     Head Circumference --      Peak Flow --      Pain Score 04/10/18 1749 8     Pain Loc --      Pain Edu? --      Excl. in [place]? --     Constitutional: Alert and oriented, crying and in distress due to pain.  HEENT:      Head: Normocephalic and atraumatic.         Eyes: Conjunctivae are normal. Sclera is non-icteric.       Mouth/Throat: Mucous membranes are moist.       Neck: Supple with no signs of meningismus. Cardiovascular: Regular rate  and rhythm. No murmurs, gallops, or rubs. 2+ symmetrical distal pulses are present in all extremities. No JVD. Respiratory: Normal respiratory effort. Lungs are clear to auscultation bilaterally. No wheezes, crackles, or rhonchi.  Gastrointestinal: Soft, tender to palpation over the LUQ, reducible ventral hernia, and non distended with positive bowel sounds. No rebound or guarding. Genitourinary: No CVA tenderness. Musculoskeletal: Nontender with normal range of motion in all extremities. No edema, cyanosis, or erythema of extremities. Neurologic: Normal speech and language. Face is symmetric. Moving all extremities. No gross focal neurologic deficits are appreciated. Skin: Skin is warm, dry and intact. No rash  noted. Psychiatric: Mood and affect are normal. Speech and behavior are normal.  ____________________________________________   LABS (all labs ordered are listed, but only abnormal results are displayed)  Labs Reviewed  COMPREHENSIVE METABOLIC PANEL - Abnormal; Notable for the following components:      Result Value   Glucose, Bld 101 (*)    All other components within normal limits  CBC - Abnormal; Notable for the following components:   RBC 5.30 (*)    HCT 47.1 (*)    RDW 15.2 (*)    All other components within normal limits  URINALYSIS, COMPLETE (UACMP) WITH MICROSCOPIC - Abnormal; Notable for the following components:   Color, Urine YELLOW (*)    APPearance CLOUDY (*)    Hgb urine dipstick LARGE (*)    Protein, ur 30 (*)    Nitrite POSITIVE (*)    Leukocytes, UA LARGE (*)    RBC / HPF >50 (*)    WBC, UA >50 (*)    Bacteria, UA FEW (*)    All other components within normal limits  URINE CULTURE  LIPASE, BLOOD   ____________________________________________  EKG  none  ____________________________________________  RADIOLOGY  I have personally reviewed the images performed during this visit and I agree with the Radiologist's read.   Interpretation by Radiologist:  Ct Renal Stone Study  Result Date: 04/10/2018 CLINICAL DATA:  57 year old female with right lower abdominal pain and painful urination for 4 days. EXAM: CT ABDOMEN AND PELVIS WITHOUT CONTRAST TECHNIQUE: Multidetector CT imaging of the abdomen and pelvis was performed following the standard protocol without IV contrast. COMPARISON:  None. FINDINGS: Lower chest: Negative lung bases. No pericardial or pleural effusion. Hepatobiliary: Small benign simple fluid density probable cyst in the anterior left hepatic lobe. Otherwise negative noncontrast liver and gallbladder. Pancreas: Negative. Spleen: Negative. Adrenals/Urinary Tract: Round low-density (-3 Hounsfield unit) 2.6 centimeter benign left adrenal adenoma  (series 2, image 18). Normal right adrenal gland. Negative noncontrast left kidney. Negative course of the left ureter. Negative noncontrast right kidney. Negative course of the right ureter. Mild to moderate circumferential urinary bladder wall thickening (sagittal image 84) is nonspecific. No perivesical stranding. No calculus within the bladder. Incidental multiple bilateral pelvic phleboliths. Stomach/Bowel: Decompressed rectum and sigmoid colon. The sigmoid is mildly redundant. Negative left colon. Mild retained stool in the transverse and right colon. Normal appendix on series 2, image 51. Normal terminal ileum. No dilated small bowel.  Decompressed stomach. There is a small mushroom shaped mesenteric fat containing ventral abdominal hernia in the upper abdomen measuring 4-5 centimeters diameter with a smaller hernia neck estimated at 12 millimeters. See series 2, image 22 and sagittal image 75. No abdominal free air, free fluid. Vascular/Lymphatic: Aortoiliac calcified atherosclerosis. Vascular patency is not evaluated in the absence of IV contrast. No lymphadenopathy. Reproductive: Fibroid uterus. Multiple fibroids estimated at up to 3.4 centimeters diameter. Some are coarsely  calcified. Negative ovaries. Other: No pelvic free fluid. Musculoskeletal: No acute osseous abnormality identified. Lumbar facet degeneration. IMPRESSION: 1. No urologic calculus or evidence of obstructive uropathy. Mild-to-moderate urinary bladder wall thickening suggesting infectious or inflammatory cystitis. 2. Otherwise no inflammatory process identified.  Normal appendix. 3. 4-5 centimeter upper abdominal ventral mesenteric fat containing hernia best demonstrated on sagittal image 75. 4. Fibroid uterus. 5. Benign left adrenal adenoma. 6.  Aortic Atherosclerosis (ICD10-I70.0). Electronically Signed   By: Genevie Ann M.D.   On: 04/10/2018 19:17      ____________________________________________   PROCEDURES  Procedure(s)  performed: None Procedures Critical Care performed:  None ____________________________________________   INITIAL IMPRESSION / ASSESSMENT AND PLAN / ED COURSE  58 y.o. female with no significant past medical history who presents for evaluation of Left sided sharp abdominal pain x 2 days.  Patient is crying and in distress due to the pain, her vitals are within normal limits, her abdomen is soft with tenderness to palpation on the left upper quadrant, there is no rebound or guarding.  Patient reports that the pain is worse as soon as she is done urinating but denies dysuria or frequency.  Differential diagnosis is broad and includes but not limited to UTI, pyelonephritis, kidney stone, diverticulitis, gastritis, PUD, SBO, ovarian pathology. Labs including CBC, CMP, lipase WNL. UA pending.     _________________________ 7:37 PM on 04/10/2018 -----------------------------------------  UA positive for UTI.  CT abdomen pelvis with no other acute findings.  Patient was given a shot of Rocephin and is going to be discharged home on Keflex.  Discussed return precautions for any signs of worsening infection or pyelonephritis.  Currently no signs of sepsis.   As part of my medical decision making, I reviewed the following data within the [facility] notes reviewed and incorporated, Labs reviewed , Radiograph reviewed , Notes from prior ED visits and [place] Controlled Substance Database    Pertinent labs & imaging results that were available during my care of the patient were reviewed by me and considered in my medical decision making (see chart for details).    ____________________________________________   FINAL CLINICAL IMPRESSION(S) / ED DIAGNOSES  Final diagnoses:  Acute cystitis with hematuria      NEW MEDICATIONS STARTED DURING THIS VISIT:  ED Discharge Orders        Ordered    cephALEXin (KEFLEX) 500 MG capsule  3 times daily     04/10/18 1937       Note:   This document was prepared using Dragon voice recognition software and may include unintentional dictation errors.    Alfred Levins [place], MD 04/10/18 [phone]

## 2018-04-13 LAB — URINE CULTURE: Culture: >=100000 — AB

## 2019-08-08 ENCOUNTER — Emergency Department
Admission: EM | Admit: 2019-08-08 | Discharge: 2019-08-08 | Disposition: A | Discharge status: Home / Self Care | Payer: Self-pay | Attending: Emergency Medicine | Admitting: Emergency Medicine

## 2019-08-08 ENCOUNTER — Emergency Department: Payer: Self-pay

## 2019-08-08 ENCOUNTER — Other Ambulatory Visit: Payer: Self-pay

## 2019-08-08 DIAGNOSIS — G8929 Other chronic pain: Secondary | ICD-10-CM | POA: Insufficient documentation

## 2019-08-08 DIAGNOSIS — F17210 Nicotine dependence, cigarettes, uncomplicated: Secondary | ICD-10-CM | POA: Insufficient documentation

## 2019-08-08 DIAGNOSIS — M25561 Pain in right knee: Secondary | ICD-10-CM | POA: Insufficient documentation

## 2019-08-08 DIAGNOSIS — N390 Urinary tract infection, site not specified: Secondary | ICD-10-CM | POA: Insufficient documentation

## 2019-08-08 LAB — URINALYSIS, COMPLETE (UACMP) WITH MICROSCOPIC
Bilirubin Urine: NEGATIVE
Glucose, UA: NEGATIVE mg/dL
Ketones, ur: NEGATIVE mg/dL
Nitrite: POSITIVE — AB
Protein, ur: 30 mg/dL — AB
RBC / HPF: >50 RBC/hpf — ABNORMAL HIGH (ref 0–5)
Specific Gravity, Urine: 1.012 (ref 1.005–1.030)
WBC, UA: >50 WBC/hpf — ABNORMAL HIGH (ref 0–5)
pH: 5 (ref 5.0–8.0)

## 2019-08-08 MED ORDER — PHENAZOPYRIDINE HCL 200 MG PO TABS: 200.0000 mg | ORAL_TABLET | Freq: Three times a day (TID) | ORAL | 0 refills | Status: DC | PRN

## 2019-08-08 MED ORDER — MELOXICAM 15 MG PO TABS: 15.0000 mg | ORAL_TABLET | Freq: Every day | ORAL | 0 refills | Status: DC

## 2019-08-08 MED ORDER — CEPHALEXIN 500 MG PO CAPS: 500.0000 mg | ORAL_CAPSULE | Freq: Three times a day (TID) | ORAL | 0 refills | Status: DC

## 2019-08-08 NOTE — ED Provider Notes (Signed)
[facility] Emergency Department Provider Note  ____________________________________________   First MD Initiated Contact with Patient 08/08/19 1346     (approximate)  I have reviewed the triage vital signs and the nursing notes.   HISTORY  Chief Complaint Urinary Frequency   HPI Stacie Kennedy is a 58 y.o. female presents to the ED with complaint of dysuria for 1 week.  Patient states that she has been taking over-the-counter Azo with some minimal relief until the last several days.  Patient states that she has pain with urination and also is going frequently.  She also wants to be seen for her right knee pain that has been going on for 3 months.  She denies any injury causing her knee pain.  She has not taken any over-the-counter medication for this.  Patient denies any nausea, vomiting, fever or chills.  There is no flank pain reported.  She rates her pain as an 8 out of 10.      History reviewed. No pertinent past medical history.  There are no active problems to display for this patient.   History reviewed. No pertinent surgical history.  Prior to Admission medications   Medication Sig Start Date End Date Taking? Authorizing Provider  cephALEXin (KEFLEX) 500 MG capsule Take 1 capsule (500 mg total) by mouth 3 (three) times daily. 08/08/19   Johnn Hai, PA-C  meloxicam (MOBIC) 15 MG tablet Take 1 tablet (15 mg total) by mouth daily. 08/08/19 08/07/20  Johnn Hai, PA-C  phenazopyridine (PYRIDIUM) 200 MG tablet Take 1 tablet (200 mg total) by mouth 3 (three) times daily as needed for pain. 08/08/19 08/07/20  Johnn Hai, PA-C    Allergies Patient has no known allergies.  No family history on file.  Social History Social History   Tobacco Use   Smoking status: Current Every Day Smoker   Smokeless tobacco: Never Used  Substance Use Topics   Alcohol use: Yes    Comment: occssional   Drug use: No    Review of  Systems Constitutional: No fever/chills Cardiovascular: Denies chest pain. Respiratory: Denies shortness of breath. Gastrointestinal: No abdominal pain.  No nausea, no vomiting.  Negative for flank pain. Genitourinary: Positive for dysuria. Musculoskeletal: Negative for back pain. Skin: Negative for rash. Neurological: Negative for headaches, focal weakness or numbness. ___________________________________________   PHYSICAL EXAM:  VITAL SIGNS: ED Triage Vitals [08/08/19 1311]  Enc Vitals Group     BP (!) 150/78     Pulse Rate 78     Resp 16     Temp 97.9 F (36.6 C)     Temp Source Oral     SpO2 98 %     Weight 130 lb (59 kg)     Height 5\' 7"  (1.702 m)     Head Circumference      Peak Flow      Pain Score 8     Pain Loc      Pain Edu?      Excl. in [place]?    Constitutional: Alert and oriented. Well appearing and in no acute distress. Eyes: Conjunctivae are normal.  Head: Atraumatic. Neck: No stridor.   Cardiovascular: Normal rate, regular rhythm. Grossly normal heart sounds.  Good peripheral circulation. Respiratory: Normal respiratory effort.  No retractions. Lungs CTAB. Gastrointestinal: Soft and nontender. No distention.  No CVA tenderness. Musculoskeletal: Examination of the right knee there is no gross deformity and no effusion with palpation of the patella.  Ligaments are  stable bilaterally.  Range of motion is without crepitus.  Patient is able to stand without any assistance.  No erythema or warmth is noted. Neurologic:  Normal speech and language. No gross focal neurologic deficits are appreciated. No gait instability. Skin:  Skin is warm, dry and intact. No rash noted. Psychiatric: Mood and affect are normal. Speech and behavior are normal.  ____________________________________________   LABS (all labs ordered are listed, but only abnormal results are displayed)  Labs Reviewed  URINALYSIS, COMPLETE (UACMP) WITH MICROSCOPIC - Abnormal; Notable for the  following components:      Result Value   Color, Urine YELLOW (*)    APPearance CLOUDY (*)    Hgb urine dipstick MODERATE (*)    Protein, ur 30 (*)    Nitrite POSITIVE (*)    Leukocytes,Ua LARGE (*)    RBC / HPF >50 (*)    WBC, UA >50 (*)    Bacteria, UA RARE (*)    Non Squamous Epithelial PRESENT (*)    All other components within normal limits    RADIOLOGY  Official radiology report(s): Dg Knee Complete 4 Views Right  Result Date: 08/08/2019 CLINICAL DATA:  Right knee pain, no injury EXAM: RIGHT KNEE - COMPLETE 4+ VIEW COMPARISON:  11/04/2011 FINDINGS: No fracture or dislocation of the right knee. Joint spaces are well preserved. No knee joint effusion. Soft tissues are unremarkable. IMPRESSION: No fracture or dislocation of the right knee. Joint spaces are well preserved. No knee joint effusion. Electronically Signed   By: Eddie Candle M.D.   On: 08/08/2019 15:13    ____________________________________________   PROCEDURES  Procedure(s) performed (including Critical Care):  Procedures   ____________________________________________   INITIAL IMPRESSION / ASSESSMENT AND PLAN / ED COURSE  As part of my medical decision making, I reviewed the following data within the electronic MEDICAL RECORD NUMBER Notes from prior ED visits and [place] Controlled Substance Database  Stacie Kennedy was evaluated in Emergency Department on 08/08/2019 for the symptoms described in the history of present illness. She was evaluated in the context of the global COVID-19 pandemic, which necessitated consideration that the patient might be at risk for infection with the SARS-CoV-2 virus that causes COVID-19. Institutional protocols and algorithms that pertain to the evaluation of patients at risk for COVID-19 are in a state of rapid change based on information released by regulatory bodies including the CDC and federal and state organizations. These policies and algorithms were followed during the patient's  care in the ED.  58 year old female presents to the ED with complaint of dysuria for approximately 7 days.  Patient states that there is been no fever, chills, nausea or vomiting.  She denies any flank pain.  She has been taking over-the-counter medication until today.  She also has complaints of her right knee for the last 3 months without history of injury.  X-rays were negative and patient was encouraged to take meloxicam and follow-up with her PCP if any continued problems.  A prescription for Keflex was also sent to her pharmacy along with Pyridium for her UTI.  Patient was encouraged to drink fluids.  If her symptoms worsen or she develops nausea, vomiting and inability to take her antibiotics along with elevated fever she is to return to the emergency department. ____________________________________________   FINAL CLINICAL IMPRESSION(S) / ED DIAGNOSES  Final diagnoses:  Acute urinary tract infection  Chronic pain of right knee     ED Discharge Orders  Ordered    meloxicam (MOBIC) 15 MG tablet  Daily     08/08/19 1529    cephALEXin (KEFLEX) 500 MG capsule  3 times daily     08/08/19 1529    phenazopyridine (PYRIDIUM) 200 MG tablet  3 times daily PRN     08/08/19 1532           Note:  This document was prepared using Dragon voice recognition software and may include unintentional dictation errors.    Johnn Hai, PA-C 08/08/19 1601    Carrie Mew, MD 08/10/19 2056

## 2019-08-08 NOTE — Discharge Instructions (Addendum)
Follow-up with your primary care provider or one of the clinics listed on your discharge papers.  You should obtain a primary care provider if you do not already have one.  Return to the emergency department if any severe worsening of your symptoms or not improving.  Begin taking meloxicam 1 daily with food for your knee pain.  Keflex 500 mg 3 times daily for the next 10 days.  Increase fluids.  Also the medication for bladder spasms will cause your urine to turn a bright fluorescent yellow-orange.  This is temporary.

## 2019-08-08 NOTE — ED Notes (Signed)
Pt states she is having pain with urination which began last Tues.  Also right knee pain for 3 months.

## 2019-08-08 NOTE — ED Triage Notes (Signed)
Pain with urination and frequency X 1 week. Ongoing right knee pain without injury. Pt alert and oriented X4, cooperative, RR even and unlabored, color WNL. Pt in NAD.

## 2019-08-08 NOTE — ED Triage Notes (Addendum)
Ambulatory to desk.  Says pain with urination and pain in rt knee.

## 2020-04-16 ENCOUNTER — Telehealth: Payer: Self-pay | Admitting: General Practice

## 2020-04-16 NOTE — Telephone Encounter (Signed)
Individual has been contacted 3+ times regarding ED referral and has been given information regarding the clinic. No further attempts to contact individual will be made. 

## 2020-04-24 ENCOUNTER — Ambulatory Visit: Payer: Self-pay | Admitting: Gerontology

## 2020-04-24 ENCOUNTER — Encounter: Payer: Self-pay | Admitting: Gerontology

## 2020-04-24 ENCOUNTER — Other Ambulatory Visit: Payer: Self-pay

## 2020-04-24 VITALS — BP 131/81 | HR 67 | Ht 67.5 in | Wt 165.0 lb

## 2020-04-24 DIAGNOSIS — F17200 Nicotine dependence, unspecified, uncomplicated: Secondary | ICD-10-CM | POA: Insufficient documentation

## 2020-04-24 DIAGNOSIS — R399 Unspecified symptoms and signs involving the genitourinary system: Secondary | ICD-10-CM

## 2020-04-24 DIAGNOSIS — Z7689 Persons encountering health services in other specified circumstances: Secondary | ICD-10-CM | POA: Insufficient documentation

## 2020-04-24 MED ORDER — NITROFURANTOIN MONOHYD MACRO 100 MG PO CAPS: 100.0000 mg | ORAL_CAPSULE | Freq: Two times a day (BID) | ORAL | 0 refills | Status: DC

## 2020-04-24 MED ORDER — PHENAZOPYRIDINE HCL 100 MG PO TABS: 100.0000 mg | ORAL_TABLET | Freq: Three times a day (TID) | ORAL | 0 refills | Status: DC | PRN

## 2020-04-24 NOTE — Progress Notes (Signed)
Patient ID: Stacie Kennedy, female   DOB: 1961-07-03, 59 y.o.   MRN: [id number]  Chief Complaint  Patient presents with  . Establish Care  . Urinary Tract Infection    smells, increase in frequency, ongoing for about 1 month    HPI Stacie Kennedy is a 59 y.o. female who presents to the clinic today for establishment of care. She reports having a recurrent history of UTI. She c/o foul smelling urine that has been going on for 2-3 months. She denies hematuria, dysuria, urinary frequency, urgency, flank and abdominal pain, but verbalized noticing some brownish discharge. She reports smoking a pack of cigarette a day and denies the desire to quit. She also reports drinking about 2 cans of beer each week. She denies chest pain, shortness or breath or dyspnea with exertion. She verbalized that her mood is good and denies suicidal or homicidal ideation. She also requests for teeth cleaning, but denies dental pain. Overall, she states that she is doing well and offers no further complaint.       Social History Social History   Tobacco Use  . Smoking status: Current Every Day Smoker  . Smokeless tobacco: Never Used  Vaping Use  . Vaping Use: Never used  Substance Use Topics  . Alcohol use: Yes    Comment: occssional  . Drug use: No    No Known Allergies  Current Outpatient Medications  Medication Sig Dispense Refill  . nitrofurantoin, macrocrystal-monohydrate, (MACROBID) 100 MG capsule Take 1 capsule (100 mg total) by mouth 2 (two) times daily. 14 capsule 0  . phenazopyridine (PYRIDIUM) 100 MG tablet Take 1 tablet (100 mg total) by mouth 3 (three) times daily as needed for pain. 6 tablet 0   No current facility-administered medications for this visit.    Review of Systems Review of Systems  Constitutional: Negative.   HENT: Negative.   Eyes: Negative.   Respiratory: Negative.   Cardiovascular: Negative.   Gastrointestinal: Negative.   Endocrine: Negative.   Genitourinary: Negative.    Musculoskeletal: Negative.   Skin: Negative.   Allergic/Immunologic: Negative.   Neurological: Negative.   Hematological: Negative.   Psychiatric/Behavioral: Negative.     Blood pressure 131/81, pulse 67, height 5' 7.5" (1.715 m), weight 165 lb (74.8 kg), SpO2 95 %.  Physical Exam Physical Exam Constitutional:      Appearance: Normal appearance.  HENT:     Head: Normocephalic and atraumatic.     Right Ear: Tympanic membrane normal.     Left Ear: Tympanic membrane normal.     Nose: Nose normal.  Eyes:     Pupils: Pupils are equal, round, and reactive to light.  Cardiovascular:     Rate and Rhythm: Normal rate and regular rhythm.     Pulses: Normal pulses.     Heart sounds: Normal heart sounds.  Pulmonary:     Effort: Pulmonary effort is normal.     Breath sounds: Normal breath sounds.  Abdominal:     General: Abdomen is flat. Bowel sounds are normal.     Palpations: Abdomen is soft.  Musculoskeletal:        General: Normal range of motion.     Cervical back: Normal range of motion and neck supple.  Skin:    General: Skin is warm.     Capillary Refill: Capillary refill takes less than 2 seconds.  Neurological:     Mental Status: She is alert and oriented to person, place, and time.  Psychiatric:  Mood and Affect: Mood normal.        Behavior: Behavior normal.        Thought Content: Thought content normal.        Judgment: Judgment normal.      Assessment & Plan  1. Encounter to establish care Routine labs will be completed  - Ambulatory referral to Hematology / Oncology - HgB A1c; Future - CBC w/Diff; Future - Comp Met (CMET); Future - Lipid panel; Future - Ambulatory referral to Gastroenterology; Future  2. Urinary tract infection symptoms Urinalysis will be completed  - Urinalysis; Future - UA/M w/rflx Culture, Routine; Future - nitrofurantoin, macrocrystal-monohydrate, (MACROBID) 100 MG capsule; Take 1 capsule (100 mg total) by mouth 2 (two)  times daily.  Dispense: 14 capsule; Refill: 0 - phenazopyridine (PYRIDIUM) 100 MG tablet; Take 1 tablet (100 mg total) by mouth 3 (three) times daily as needed for pain.  Dispense: 6 tablet; Refill: 0 - UA/M w/rflx Culture, Routine  3. Smoking She was advised to stop smoking and was provided with information on [place] [facility]uit [facility] tobacco cessation program. - CT CHEST LUNG CA SCREEN LOW DOSE W/O CM; Future  Follow-Up: 05/15/2020 or sooner if symptoms worsen or fail to improve.   Carney Corners 04/24/2020, 3:20 PM

## 2020-04-24 NOTE — Patient Instructions (Signed)

## 2020-04-29 ENCOUNTER — Other Ambulatory Visit: Payer: Self-pay | Admitting: Gerontology

## 2020-04-30 LAB — MICROSCOPIC EXAMINATION
Casts: NONE SEEN /lpf
WBC, UA: >30 /hpf — AB (ref 0–5)

## 2020-04-30 LAB — UA/M W/RFLX CULTURE, ROUTINE
Bilirubin, UA: NEGATIVE
Glucose, UA: NEGATIVE
Ketones, UA: NEGATIVE
Nitrite, UA: NEGATIVE
Protein,UA: NEGATIVE
Specific Gravity, UA: 1.022 (ref 1.005–1.030)
Urobilinogen, Ur: 0.2 mg/dL (ref 0.2–1.0)
pH, UA: 5.5 (ref 5.0–7.5)

## 2020-04-30 LAB — URINE CULTURE, REFLEX

## 2020-05-14 ENCOUNTER — Other Ambulatory Visit: Payer: Self-pay

## 2020-05-14 DIAGNOSIS — Z7689 Persons encountering health services in other specified circumstances: Secondary | ICD-10-CM

## 2020-05-14 DIAGNOSIS — R399 Unspecified symptoms and signs involving the genitourinary system: Secondary | ICD-10-CM

## 2020-05-16 LAB — COMPREHENSIVE METABOLIC PANEL
ALT: 8 IU/L (ref 0–32)
AST: 14 IU/L (ref 0–40)
Albumin/Globulin Ratio: 1.5 (ref 1.2–2.2)
Albumin: 4.2 g/dL (ref 3.8–4.9)
Alkaline Phosphatase: 99 IU/L (ref 48–121)
BUN/Creatinine Ratio: 15 (ref 9–23)
BUN: 9 mg/dL (ref 6–24)
Bilirubin Total: 0.3 mg/dL (ref 0.0–1.2)
CO2: 23 mmol/L (ref 20–29)
Calcium: 9.6 mg/dL (ref 8.7–10.2)
Chloride: 103 mmol/L (ref 96–106)
Creatinine, Ser: 0.59 mg/dL (ref 0.57–1.00)
GFR calc Af Amer: 117 mL/min/{1.73_m2} (ref >59)
GFR calc non Af Amer: 101 mL/min/{1.73_m2} (ref >59)
Globulin, Total: 2.8 g/dL (ref 1.5–4.5)
Glucose: 90 mg/dL (ref 65–99)
Potassium: 4.3 mmol/L (ref 3.5–5.2)
Sodium: 139 mmol/L (ref 134–144)
Total Protein: 7 g/dL (ref 6.0–8.5)

## 2020-05-16 LAB — CBC WITH DIFFERENTIAL/PLATELET
Basophils Absolute: 0.1 10*3/uL (ref 0.0–0.2)
Basos: 1 %
EOS (ABSOLUTE): 0.2 10*3/uL (ref 0.0–0.4)
Eos: 3 %
Hematocrit: 47.2 % — ABNORMAL HIGH (ref 34.0–46.6)
Hemoglobin: 16.3 g/dL — ABNORMAL HIGH (ref 11.1–15.9)
Immature Grans (Abs): 0 10*3/uL (ref 0.0–0.1)
Immature Granulocytes: 0 %
Lymphocytes Absolute: 3.9 10*3/uL — ABNORMAL HIGH (ref 0.7–3.1)
Lymphs: 50 %
MCH: 30.1 pg (ref 26.6–33.0)
MCHC: 34.5 g/dL (ref 31.5–35.7)
MCV: 87 fL (ref 79–97)
Monocytes Absolute: 0.6 10*3/uL (ref 0.1–0.9)
Monocytes: 8 %
Neutrophils Absolute: 3 10*3/uL (ref 1.4–7.0)
Neutrophils: 38 %
Platelets: 353 10*3/uL (ref 150–450)
RBC: 5.41 x10E6/uL — ABNORMAL HIGH (ref 3.77–5.28)
RDW: 14.3 % (ref 11.7–15.4)
WBC: 7.7 10*3/uL (ref 3.4–10.8)

## 2020-05-16 LAB — LIPID PANEL
Chol/HDL Ratio: 6.2 ratio — ABNORMAL HIGH (ref 0.0–4.4)
Cholesterol, Total: 234 mg/dL — ABNORMAL HIGH (ref 100–199)
HDL: 38 mg/dL — ABNORMAL LOW (ref >39)
LDL Chol Calc (NIH): 155 mg/dL — ABNORMAL HIGH (ref 0–99)
Triglycerides: 224 mg/dL — ABNORMAL HIGH (ref 0–149)
VLDL Cholesterol Cal: 41 mg/dL — ABNORMAL HIGH (ref 5–40)

## 2020-05-16 LAB — HEMOGLOBIN A1C
Est. average glucose Bld gHb Est-mCnc: 126 mg/dL
Hgb A1c MFr Bld: 6 % — ABNORMAL HIGH (ref 4.8–5.6)

## 2020-05-16 LAB — URINALYSIS

## 2020-05-22 ENCOUNTER — Ambulatory Visit: Payer: Self-pay | Admitting: Gerontology

## 2020-05-22 VITALS — BP 115/76 | HR 62 | Temp 98.0°F | Resp 16 | Ht 67.0 in | Wt 165.6 lb

## 2020-05-22 DIAGNOSIS — Z0000 Encounter for general adult medical examination without abnormal findings: Secondary | ICD-10-CM

## 2020-05-22 DIAGNOSIS — N393 Stress incontinence (female) (male): Secondary | ICD-10-CM | POA: Insufficient documentation

## 2020-05-22 DIAGNOSIS — F17200 Nicotine dependence, unspecified, uncomplicated: Secondary | ICD-10-CM

## 2020-05-22 DIAGNOSIS — E785 Hyperlipidemia, unspecified: Secondary | ICD-10-CM

## 2020-05-22 DIAGNOSIS — IMO0001 Reserved for inherently not codable concepts without codable children: Secondary | ICD-10-CM

## 2020-05-22 DIAGNOSIS — R7303 Prediabetes: Secondary | ICD-10-CM | POA: Insufficient documentation

## 2020-05-22 DIAGNOSIS — Z1211 Encounter for screening for malignant neoplasm of colon: Secondary | ICD-10-CM | POA: Insufficient documentation

## 2020-05-22 MED ORDER — ROSUVASTATIN CALCIUM 5 MG PO TABS: 5.0000 mg | ORAL_TABLET | Freq: Every day | ORAL | 0 refills | Status: DC

## 2020-05-22 MED ORDER — METFORMIN HCL 500 MG PO TABS: 500.0000 mg | ORAL_TABLET | Freq: Every day | ORAL | 0 refills | Status: DC

## 2020-05-22 NOTE — Patient Instructions (Addendum)
Smoking Tobacco Information, Adult Smoking tobacco can be harmful to your health. Tobacco contains a poisonous (toxic), colorless chemical called nicotine. Nicotine is addictive. It changes the brain and can make it hard to stop smoking. Tobacco also has other toxic chemicals that can hurt your body and raise your risk of many cancers. How can smoking tobacco affect me? Smoking tobacco puts you at risk for:  Cancer. Smoking is most commonly associated with lung cancer, but can also lead to cancer in other parts of the body.  Chronic obstructive pulmonary disease (COPD). This is a long-term lung condition that makes it hard to breathe. It also gets worse over time.  High blood pressure (hypertension), heart disease, stroke, or heart attack.  Lung infections, such as pneumonia.  Cataracts. This is when the lenses in the eyes become clouded.  Digestive problems. This may include peptic ulcers, heartburn, and gastroesophageal reflux disease (GERD).  Oral health problems, such as gum disease and tooth loss.  Loss of taste and smell. Smoking can affect your appearance by causing:  Wrinkles.  Yellow or stained teeth, fingers, and fingernails. Smoking tobacco can also affect your social life, because:  It may be challenging to find places to smoke when away from home. Many workplaces, [employer], hotels, and public places are tobacco-free.  Smoking is expensive. This is due to the cost of tobacco and the long-term costs of treating health problems from smoking.  Secondhand smoke may affect those around you. Secondhand smoke can cause lung cancer, breathing problems, and heart disease. Children of smokers have a higher risk for: ? Sudden infant death syndrome (SIDS). ? Ear infections. ? Lung infections. If you currently smoke tobacco, quitting now can help you:  Lead a longer and healthier life.  Look, smell, breathe, and feel better over time.  Save money.  Protect others from the  harms of secondhand smoke. What actions can I take to prevent health problems? Quit smoking   Do not start smoking. Quit if you already do.  Make a plan to quit smoking and commit to it. Look for programs to help you and ask your health care provider for recommendations and ideas.  Set a date and write down all the reasons you want to quit.  Let your friends and family know you are quitting so they can help and support you. Consider finding friends who also want to quit. It can be easier to quit with someone else, so that you can support each other.  Talk with your health care provider about using nicotine replacement medicines to help you quit, such as gum, lozenges, patches, sprays, or pills.  Do not replace cigarette smoking with electronic cigarettes, which are commonly called e-cigarettes. The safety of e-cigarettes is not known, and some may contain harmful chemicals.  If you try to quit but return to smoking, stay positive. It is common to slip up when you first quit, so take it one day at a time.  Be prepared for cravings. When you feel the urge to smoke, chew gum or suck on hard candy. Lifestyle  Stay busy and take care of your body.  Drink enough fluid to keep your urine pale yellow.  Get plenty of exercise and eat a healthy diet. This can help prevent weight gain after quitting.  Monitor your eating habits. Quitting smoking can cause you to have a larger appetite than when you smoke.  Find ways to relax. Go out with friends or family to a movie or a restaurant  where people do not smoke.  Ask your health care provider about having regular tests (screenings) to check for cancer. This may include blood tests, imaging tests, and other tests.  Find ways to manage your stress, such as meditation, yoga, or exercise. Where to find support To get support to quit smoking, consider:  Asking your health care provider for more information and resources.  Taking classes to learn  more about quitting smoking.  Looking for local organizations that offer resources about quitting smoking.  Joining a support group for people who want to quit smoking in your local community.  Calling the smokefree.gov counselor helpline: 1-800-Quit-Now (630)552-2623) Where to find more information You may find more information about quitting smoking from:  HelpGuide.org: www.helpguide.org  https://hall.com/: smokefree.gov  American Lung Association: www.lung.org Contact a health care provider if you:  Have problems breathing.  Notice that your lips, nose, or fingers turn blue.  Have chest pain.  Are coughing up blood.  Feel faint or you pass out.  Have other health changes that cause you to worry. Summary  Smoking tobacco can negatively affect your health, the health of those around you, your finances, and your social life.  Do not start smoking. Quit if you already do. If you need help quitting, ask your health care provider.  Think about joining a support group for people who want to quit smoking in your local community. There are many effective programs that will help you to quit this behavior. This information is not intended to replace advice given to you by your health care provider. Make sure you discuss any questions you have with your health care provider. Document Revised: 06/22/2019 Document Reviewed: 10/12/2016 Elsevier Patient Education  [place] and Cholesterol Restricted Eating Plan Getting too much fat and cholesterol in your diet may cause health problems. Choosing the right foods helps keep your fat and cholesterol at normal levels. This can keep you from getting certain diseases. Your doctor may recommend an eating plan that includes:  Total fat: ______% or less of total calories a day.  Saturated fat: ______% or less of total calories a day.  Cholesterol: less than _________mg a day.  Fiber: ______g a day. What are tips for following  this plan? Meal planning  At meals, divide your plate into four equal parts: ? Fill one-half of your plate with vegetables and green salads. ? Fill one-fourth of your plate with whole grains. ? Fill one-fourth of your plate with low-fat (lean) protein foods.  Eat fish that is high in omega-3 fats at least two times a week. This includes mackerel, tuna, sardines, and salmon.  Eat foods that are high in fiber, such as whole grains, beans, apples, broccoli, carrots, peas, and barley. General tips   Work with your doctor to lose weight if you need to.  Avoid: ? Foods with added sugar. ? Fried foods. ? Foods with partially hydrogenated oils.  Limit alcohol intake to no more than 1 drink a day for nonpregnant women and 2 drinks a day for men. One drink equals 12 oz of beer, 5 oz of wine, or 1 oz of hard liquor. Reading food labels  Check food labels for: ? Trans fats. ? Partially hydrogenated oils. ? Saturated fat (g) in each serving. ? Cholesterol (mg) in each serving. ? Fiber (g) in each serving.  Choose foods with healthy fats, such as: ? Monounsaturated fats. ? Polyunsaturated fats. ? Omega-3 fats.  Choose grain products that have whole grains.  Look for the word "whole" as the first word in the ingredient list. Cooking  Cook foods using low-fat methods. These include baking, boiling, grilling, and broiling.  Eat more home-cooked foods. Eat at restaurants and buffets less often.  Avoid cooking using saturated fats, such as butter, cream, palm oil, palm kernel oil, and coconut oil. Recommended foods  Fruits  All fresh, canned (in natural juice), or frozen fruits. Vegetables  Fresh or frozen vegetables (raw, steamed, roasted, or grilled). Green salads. Grains  Whole grains, such as whole wheat or whole grain breads, crackers, cereals, and pasta. Unsweetened oatmeal, bulgur, barley, quinoa, or brown rice. Corn or whole wheat flour tortillas. Meats and other protein  foods  Ground beef (85% or leaner), grass-fed beef, or beef trimmed of fat. Skinless chicken or [place]. Ground chicken or [place]. Pork trimmed of fat. All fish and seafood. Egg whites. Dried beans, peas, or lentils. Unsalted nuts or seeds. Unsalted canned beans. Nut butters without added sugar or oil. Dairy  Low-fat or nonfat dairy products, such as skim or 1% milk, 2% or reduced-fat cheeses, low-fat and fat-free ricotta or cottage cheese, or plain low-fat and nonfat yogurt. Fats and oils  Tub margarine without trans fats. Light or reduced-fat mayonnaise and salad dressings. Avocado. Olive, canola, sesame, or safflower oils. The items listed above may not be a complete list of foods and beverages you can eat. Contact a dietitian for more information. Foods to avoid Fruits  Canned fruit in heavy syrup. Fruit in cream or butter sauce. Fried fruit. Vegetables  Vegetables cooked in cheese, cream, or butter sauce. Fried vegetables. Grains  White bread. White pasta. White rice. Cornbread. Bagels, pastries, and croissants. Crackers and snack foods that contain trans fat and hydrogenated oils. Meats and other protein foods  Fatty cuts of meat. Ribs, chicken wings, bacon, sausage, bologna, salami, chitterlings, fatback, hot dogs, bratwurst, and packaged lunch meats. Liver and organ meats. Whole eggs and egg yolks. Chicken and [place] with skin. Fried meat. Dairy  Whole or 2% milk, cream, half-and-half, and cream cheese. Whole milk cheeses. Whole-fat or sweetened yogurt. Full-fat cheeses. Nondairy creamers and whipped toppings. Processed cheese, cheese spreads, and cheese curds. Beverages  Alcohol. Sugar-sweetened drinks such as sodas, lemonade, and fruit drinks. Fats and oils  Butter, stick margarine, lard, shortening, ghee, or bacon fat. Coconut, palm kernel, and palm oils. Sweets and desserts  Corn syrup, sugars, honey, and molasses. Candy. Jam and jelly. Syrup. Sweetened cereals. Cookies,  pies, cakes, donuts, muffins, and ice cream. The items listed above may not be a complete list of foods and beverages you should avoid. Contact a dietitian for more information. Summary  Choosing the right foods helps keep your fat and cholesterol at normal levels. This can keep you from getting certain diseases.  At meals, fill one-half of your plate with vegetables and green salads.  Eat high-fiber foods, like whole grains, beans, apples, carrots, peas, and barley.  Limit added sugar, saturated fats, alcohol, and fried foods. This information is not intended to replace advice given to you by your health care provider. Make sure you discuss any questions you have with your health care provider. Document Revised: 05/31/2018 Document Reviewed: 06/14/2017 Elsevier Patient Education  2020 [place].  Prediabetes Eating Plan Prediabetes is a condition that causes blood sugar (glucose) levels to be higher than normal. This increases the risk for developing diabetes. In order to prevent diabetes from developing, your health care provider may recommend a diet and other lifestyle changes  to help you:  Control your blood glucose levels.  Improve your cholesterol levels.  Manage your blood pressure. Your health care provider may recommend working with a diet and nutrition specialist (dietitian) to make a meal plan that is best for you. What are tips for following this plan? Lifestyle  Set weight loss goals with the help of your health care team. It is recommended that most people with prediabetes lose 7% of their current body weight.  Exercise for at least 30 minutes at least 5 days a week.  Attend a support group or seek ongoing support from a mental health counselor.  Take over-the-counter and prescription medicines only as told by your health care provider. Reading food labels  Read food labels to check the amount of fat, salt (sodium), and sugar in prepackaged foods. Avoid foods  that have: ? Saturated fats. ? Trans fats. ? Added sugars.  Avoid foods that have more than 300 milligrams (mg) of sodium per serving. Limit your daily sodium intake to less than 2,300 mg each day. Shopping  Avoid buying pre-made and processed foods. Cooking  Cook with olive oil. Do not use butter, lard, or ghee.  Bake, broil, grill, or boil foods. Avoid frying. Meal planning   Work with your dietitian to develop an eating plan that is right for you. This may include: ? Tracking how many calories you take in. Use a food diary, notebook, or mobile application to track what you eat at each meal. ? Using the glycemic index (GI) to plan your meals. The index tells you how quickly a food will raise your blood glucose. Choose low-GI foods. These foods take a longer time to raise blood glucose.  Consider following a Mediterranean diet. This diet includes: ? Several servings each day of fresh fruits and vegetables. ? Eating fish at least twice a week. ? Several servings each day of whole grains, beans, nuts, and seeds. ? Using olive oil instead of other fats. ? Moderate alcohol consumption. ? Eating small amounts of red meat and whole-fat dairy.  If you have high blood pressure, you may need to limit your sodium intake or follow a diet such as the DASH eating plan. DASH is an eating plan that aims to lower high blood pressure. What foods are recommended? The items listed below may not be a complete list. Talk with your dietitian about what dietary choices are best for you. Grains Whole grains, such as whole-wheat or whole-grain breads, crackers, cereals, and pasta. Unsweetened oatmeal. Bulgur. Barley. Quinoa. Brown rice. Corn or whole-wheat flour tortillas or taco shells. Vegetables Lettuce. Spinach. Peas. Beets. Cauliflower. Cabbage. Broccoli. Carrots. Tomatoes. Squash. Eggplant. Herbs. Peppers. Onions. Cucumbers. Brussels sprouts. Fruits Berries. Bananas. Apples. Oranges. Grapes.  Papaya. Mango. Pomegranate. Kiwi. Grapefruit. Cherries. Meats and other protein foods Seafood. Poultry without skin. Lean cuts of pork and beef. Tofu. Eggs. Nuts. Beans. Dairy Low-fat or fat-free dairy products, such as yogurt, cottage cheese, and cheese. Beverages Water. Tea. Coffee. Sugar-free or diet soda. Seltzer water. Lowfat or no-fat milk. Milk alternatives, such as soy or almond milk. Fats and oils Olive oil. Canola oil. Sunflower oil. Grapeseed oil. Avocado. Walnuts. Sweets and desserts Sugar-free or low-fat pudding. Sugar-free or low-fat ice cream and other frozen treats. Seasoning and other foods Herbs. Sodium-free spices. Mustard. Relish. Low-fat, low-sugar ketchup. Low-fat, low-sugar barbecue sauce. Low-fat or fat-free mayonnaise. What foods are not recommended? The items listed below may not be a complete list. Talk with your dietitian about what dietary choices  are best for you. Grains Refined white flour and flour products, such as bread, pasta, snack foods, and cereals. Vegetables Canned vegetables. Frozen vegetables with butter or cream sauce. Fruits Fruits canned with syrup. Meats and other protein foods Fatty cuts of meat. Poultry with skin. Breaded or fried meat. Processed meats. Dairy Full-fat yogurt, cheese, or milk. Beverages Sweetened drinks, such as sweet iced tea and soda. Fats and oils Butter. Lard. Ghee. Sweets and desserts Baked goods, such as cake, cupcakes, pastries, cookies, and cheesecake. Seasoning and other foods Spice mixes with added salt. Ketchup. Barbecue sauce. Mayonnaise. Summary  To prevent diabetes from developing, you may need to make diet and other lifestyle changes to help control blood sugar, improve cholesterol levels, and manage your blood pressure.  Set weight loss goals with the help of your health care team. It is recommended that most people with prediabetes lose 7 percent of their current body weight.  Consider following a  Mediterranean diet that includes plenty of fresh fruits and vegetables, whole grains, beans, nuts, seeds, fish, lean meat, low-fat dairy, and healthy oils. This information is not intended to replace advice given to you by your health care provider. Make sure you discuss any questions you have with your health care provider. Document Revised: 01/19/2019 Document Reviewed: 12/01/2016 Elsevier Patient Education  [place].  Kegel Exercises  Kegel exercises can help strengthen your pelvic floor muscles. The pelvic floor is a group of muscles that support your rectum, small intestine, and bladder. In females, pelvic floor muscles also help support the womb (uterus). These muscles help you control the flow of urine and stool. Kegel exercises are painless and simple, and they do not require any equipment. Your provider may suggest Kegel exercises to:  Improve bladder and bowel control.  Improve sexual response.  Improve weak pelvic floor muscles after surgery to remove the uterus (hysterectomy) or pregnancy (females).  Improve weak pelvic floor muscles after prostate gland removal or surgery (males). Kegel exercises involve squeezing your pelvic floor muscles, which are the same muscles you squeeze when you try to stop the flow of urine or keep from passing gas. The exercises can be done while sitting, standing, or lying down, but it is best to vary your position. Exercises How to do Kegel exercises: 1. Squeeze your pelvic floor muscles tight. You should feel a tight lift in your rectal area. If you are a female, you should also feel a tightness in your vaginal area. Keep your stomach, buttocks, and legs relaxed. 2. Hold the muscles tight for up to 10 seconds. 3. Breathe normally. 4. Relax your muscles. 5. Repeat as told by your health care provider. Repeat this exercise daily as told by your health care provider. Continue to do this exercise for at least 4-6 weeks, or for as long as told  by your health care provider. You may be referred to a physical therapist who can help you learn more about how to do Kegel exercises. Depending on your condition, your health care provider may recommend:  Varying how long you squeeze your muscles.  Doing several sets of exercises every day.  Doing exercises for several weeks.  Making Kegel exercises a part of your regular exercise routine. This information is not intended to replace advice given to you by your health care provider. Make sure you discuss any questions you have with your health care provider. Document Revised: 05/17/2018 Document Reviewed: 05/17/2018 Elsevier Patient Education  [place].

## 2020-05-22 NOTE — Progress Notes (Signed)
Established Patient Office Visit  Subjective:  Patient ID: Stacie Kennedy, female    DOB: 1961/04/19  Age: 59 y.o. MRN: [id number]  CC: No chief complaint on file.   HPI Stacie Kennedy is a 59 y.o female who presents for follow up of recurrent UTI and lab review. Though, she denies hematuria or dysuria, she did report experiencing episodes of urge/stress incontinence which has been on-going for about 8 months. She verbalized smoking a pack of cigarette daily for more than 15 years, and endorsed no intention of quitting. Her recent lipid panel done 05/14/2020 showed Total cholesterol of 234mg /dL, Triglycerides 224 mg/dL, HDL was 38 mg/dL, LDL was 155 mg/dL. Her Hgb A1C done 05/14/2020 was noted to be 6%.She denies peripheral neuropathy,  hypo or hyperglycemic symptoms. Overall, she states that she is doing well and offers no additional complaint.      No past medical history on file.  No past surgical history on file.  No family history on file.  Social History   Socioeconomic History   Marital status: Single    Spouse name: Not on file   Number of children: Not on file   Years of education: Not on file   Highest education level: Not on file  Occupational History   Not on file  Tobacco Use   Smoking status: Current Every Day Smoker   Smokeless tobacco: Never Used  Vaping Use   Vaping Use: Never used  Substance and Sexual Activity   Alcohol use: Yes    Comment: occssional   Drug use: No   Sexual activity: Not on file  Other Topics Concern   Not on file  Social History Narrative   Not on file   Social Determinants of Health   Financial Resource Strain:    Difficulty of Paying Living Expenses:   Food Insecurity:    Worried About Charity fundraiser in the Last Year:    Arboriculturist in the Last Year:   Transportation Needs:    Film/video editor (Medical):    Lack of Transportation (Non-Medical):   Physical Activity:    Days of Exercise per  Week:    Minutes of Exercise per Session:   Stress:    Feeling of Stress :   Social Connections:    Frequency of Communication with Friends and Family:    Frequency of Social Gatherings with Friends and Family:    Attends Religious Services:    Active Member of Clubs or Organizations:    Attends Music therapist:    Marital Status:   Intimate Partner Violence:    Fear of Current or Ex-Partner:    Emotionally Abused:    Physically Abused:    Sexually Abused:     Outpatient Medications Prior to Visit  Medication Sig Dispense Refill   nitrofurantoin, macrocrystal-monohydrate, (MACROBID) 100 MG capsule Take 1 capsule (100 mg total) by mouth 2 (two) times daily. (Patient not taking: Reported on 05/22/2020) 14 capsule 0   phenazopyridine (PYRIDIUM) 100 MG tablet Take 1 tablet (100 mg total) by mouth 3 (three) times daily as needed for pain. (Patient not taking: Reported on 05/22/2020) 6 tablet 0   No facility-administered medications prior to visit.    No Known Allergies  ROS Review of Systems  Constitutional: Negative.   Respiratory: Negative.   Cardiovascular: Negative.   Gastrointestinal: Negative.   Endocrine: Negative.   Genitourinary: Negative.   Allergic/Immunologic: Negative.   Neurological: Negative.   Hematological: Negative.  Psychiatric/Behavioral: Negative.       Objective:    Physical Exam Constitutional:      Appearance: Normal appearance.  HENT:     Head: Normocephalic and atraumatic.  Cardiovascular:     Rate and Rhythm: Normal rate and regular rhythm.     Pulses: Normal pulses.     Heart sounds: Normal heart sounds.  Pulmonary:     Effort: Pulmonary effort is normal.     Breath sounds: Wheezing (.Bilateral anterior and posterior wheezing on expiration ) present.  Neurological:     General: No focal deficit present.     Mental Status: She is alert and oriented to person, place, and time.  Psychiatric:        Mood and  Affect: Mood normal.        Behavior: Behavior normal.        Thought Content: Thought content normal.        Judgment: Judgment normal.     BP 115/76 (BP Location: Right Arm, Patient Position: Sitting, Cuff Size: Normal)    Pulse 62    Temp 98 F (36.7 C)    Resp 16    Ht 5\' 7"  (1.702 m)    Wt 165 lb 9.6 oz (75.1 kg)    SpO2 96%    BMI 25.94 kg/m  Wt Readings from Last 3 Encounters:  05/22/20 165 lb 9.6 oz (75.1 kg)  04/24/20 165 lb (74.8 kg)  08/08/19 130 lb (59 kg)     Health Maintenance Due  Topic Date Due   Hepatitis C Screening  Never done   HIV Screening  Never done   TETANUS/TDAP  Never done   PAP SMEAR-Modifier  Never done   MAMMOGRAM  Never done   COLONOSCOPY  Never done   INFLUENZA VACCINE  05/11/2020    There are no preventive care reminders to display for this patient.  No results found for: TSH Lab Results  Component Value Date   WBC 7.7 05/14/2020   HGB 16.3 (H) 05/14/2020   HCT 47.2 (H) 05/14/2020   MCV 87 05/14/2020   PLT 353 05/14/2020   Lab Results  Component Value Date   NA 139 05/14/2020   K 4.3 05/14/2020   CO2 23 05/14/2020   GLUCOSE 90 05/14/2020   BUN 9 05/14/2020   CREATININE 0.59 05/14/2020   BILITOT 0.3 05/14/2020   ALKPHOS 99 05/14/2020   AST 14 05/14/2020   ALT 8 05/14/2020   PROT 7.0 05/14/2020   ALBUMIN 4.2 05/14/2020   CALCIUM 9.6 05/14/2020   ANIONGAP 10 04/10/2018   Lab Results  Component Value Date   CHOL 234 (H) 05/14/2020   Lab Results  Component Value Date   HDL 38 (L) 05/14/2020   Lab Results  Component Value Date   LDLCALC 155 (H) 05/14/2020   Lab Results  Component Value Date   TRIG 224 (H) 05/14/2020   Lab Results  Component Value Date   CHOLHDL 6.2 (H) 05/14/2020   Lab Results  Component Value Date   HGBA1C 6.0 (H) 05/14/2020      Assessment & Plan:   1. Health care maintenance - Ambulatory referral to Hematology / Oncology for mammogram and pap smear - Ambulatory referral to  Gastroenterology for colonoscopy   2. Prediabetes Her HgbA1C was 6% she will start  - metFORMIN (GLUCOPHAGE) 500 MG tablet; Take 1 tablet (500 mg total) by mouth daily with breakfast.  Dispense: 30 tablet; Refill: 0 -She was educated on side  effects of medication and was advised to contact clinic.  -She was advised to start low carb/ concentrated sweets and exercise as tolerated.  3. Elevated lipids Her ASCVD risk was 9.1%, she will start  - rosuvastatin (CRESTOR) 5 MG tablet; Take 1 tablet (5 mg total) by mouth daily.  Dispense: 30 tablet; Refill: 0 -She was educated on side effects of medication and was advised to contact clinic.  -She was advised to start low fat/cholesterol diet and exercise as tolerated.  4. Smoking -She was strongly advised to quit smoking and was provided with information on smoking cessation program.  5. Stress incontinence of urine -She was educated and advised to perform Kegel exercises - Urinalysis; Future - UA/M w/rflx Culture, Routine; Future      Follow-up: Return in about 3 weeks (around 06/12/2020), or if symptoms worsen or fail to improve.    Carney Corners, RN

## 2020-05-28 ENCOUNTER — Telehealth: Payer: Self-pay

## 2020-05-28 ENCOUNTER — Other Ambulatory Visit: Payer: Self-pay

## 2020-05-28 ENCOUNTER — Ambulatory Visit: Payer: Self-pay | Admitting: Pharmacy Technician

## 2020-05-28 DIAGNOSIS — Z79899 Other long term (current) drug therapy: Secondary | ICD-10-CM

## 2020-05-28 NOTE — Telephone Encounter (Signed)
Received referral from [facility] for CT lung screening program.  Attempted to reach patient to explain more about program, however phone was not answered and voice mail is full on phone.

## 2020-05-28 NOTE — Progress Notes (Signed)
Patient still needs to provide 2020 tax return and current bank statement before eligibility criteria can be determined.  No additional medication assistance will be provided until patient provides requested financial information.  [facility]

## 2020-05-29 NOTE — Telephone Encounter (Signed)
No answer or voicemail option available in attempt to contact regarding lung screening referral.

## 2020-06-04 ENCOUNTER — Other Ambulatory Visit: Payer: Self-pay

## 2020-06-04 DIAGNOSIS — N393 Stress incontinence (female) (male): Secondary | ICD-10-CM

## 2020-06-05 ENCOUNTER — Telehealth: Payer: Self-pay

## 2020-06-05 NOTE — Telephone Encounter (Signed)
Attempted to reach patient again for lung CT screening program.  Voice mail on cell phone is full and I am unable to leave a message.

## 2020-06-07 LAB — UA/M W/RFLX CULTURE, ROUTINE
Bilirubin, UA: NEGATIVE
Glucose, UA: NEGATIVE
Ketones, UA: NEGATIVE
Nitrite, UA: POSITIVE — AB
Specific Gravity, UA: 1.023 (ref 1.005–1.030)
Urobilinogen, Ur: 0.2 mg/dL (ref 0.2–1.0)
pH, UA: 5.5 (ref 5.0–7.5)

## 2020-06-07 LAB — MICROSCOPIC EXAMINATION
Casts: NONE SEEN /lpf
Epithelial Cells (non renal): >10 /hpf — AB (ref 0–10)
RBC, Urine: NONE SEEN /hpf (ref 0–2)
WBC, UA: >30 /hpf — AB (ref 0–5)

## 2020-06-07 LAB — URINE CULTURE, REFLEX

## 2020-06-12 ENCOUNTER — Ambulatory Visit: Payer: Self-pay | Admitting: Gerontology

## 2020-06-24 ENCOUNTER — Ambulatory Visit: Payer: Self-pay | Admitting: Gerontology

## 2020-06-25 ENCOUNTER — Ambulatory Visit: Payer: Self-pay | Admitting: Gerontology

## 2020-06-25 ENCOUNTER — Other Ambulatory Visit: Payer: Self-pay

## 2020-06-25 ENCOUNTER — Encounter: Payer: Self-pay | Admitting: Gerontology

## 2020-06-25 VITALS — BP 122/76 | HR 65 | Ht 67.5 in | Wt 162.7 lb

## 2020-06-25 DIAGNOSIS — E785 Hyperlipidemia, unspecified: Secondary | ICD-10-CM

## 2020-06-25 DIAGNOSIS — R7303 Prediabetes: Secondary | ICD-10-CM

## 2020-06-25 DIAGNOSIS — R399 Unspecified symptoms and signs involving the genitourinary system: Secondary | ICD-10-CM

## 2020-06-25 NOTE — Progress Notes (Signed)
Established Patient Office Visit  Subjective:  Patient ID: Stacie Kennedy, female    DOB: 1961/08/29  Age: 59 y.o. MRN: [id number]  CC: No chief complaint on file.   HPI Stacie Kennedy is a 59 y.o. female who presents for follow up of her prediabetes and hyperlipidemia and recent cystitis. She was started on 500 mg of metformin and 5 mg of rosuvastatin last visit after her recent lipid panel showed total cholesterol of 234mg /dL, triglycerides 224 mg/dL, HDL was 38 mg/dL, LDL was 155 mg/dL. Her Hgb A1C done 05/14/2020 was noted to be 6%. She has stopped taking both new medications 2 weeks ago because she states that they made her feel "tired and weak." She states that she is feeling better after stopping the medications and does not want to take them anymore. She reports that she has been controlling her portion size when eating since last visit. She reports that she finished her prescribed regimen of antibiotics and denies dysuria or hematuria at this time, but reports that she is continuing to "leak" urine. Overall, patient states that she is doing well and denies further complaint.   No past medical history on file.  No past surgical history on file.  No family history on file.  Social History   Socioeconomic History  . Marital status: Single    Spouse name: Not on file  . Number of children: Not on file  . Years of education: Not on file  . Highest education level: Not on file  Occupational History  . Not on file  Tobacco Use  . Smoking status: Current Every Day Smoker  . Smokeless tobacco: Never Used  Vaping Use  . Vaping Use: Never used  Substance and Sexual Activity  . Alcohol use: Yes    Comment: occssional  . Drug use: No  . Sexual activity: Not on file  Other Topics Concern  . Not on file  Social History Narrative  . Not on file   Social Determinants of Health   Financial Resource Strain: High Risk  . Difficulty of Paying Living Expenses: Hard  Food Insecurity:  Food Insecurity Present  . Worried About Charity fundraiser in the Last Year: Sometimes true  . Ran Out of Food in the Last Year: Sometimes true  Transportation Needs: No Transportation Needs  . Lack of Transportation (Medical): No  . Lack of Transportation (Non-Medical): No  Physical Activity: Sufficiently Active  . Days of Exercise per Week: 5 days  . Minutes of Exercise per Session: 60 min  Stress: Stress Concern Present  . Feeling of Stress : Very much  Social Connections: Socially Isolated  . Frequency of Communication with Friends and Family: Never  . Frequency of Social Gatherings with Friends and Family: Never  . Attends Religious Services: Never  . Active Member of Clubs or Organizations: No  . Attends Archivist Meetings: Never  . Marital Status: Never married  Intimate Partner Violence: Not At Risk  . Fear of Current or Ex-Partner: No  . Emotionally Abused: No  . Physically Abused: No  . Sexually Abused: No    Outpatient Medications Prior to Visit  Medication Sig Dispense Refill  . metFORMIN (GLUCOPHAGE) 500 MG tablet Take 1 tablet (500 mg total) by mouth daily with breakfast. (Patient not taking: Reported on 06/25/2020) 30 tablet 0  . rosuvastatin (CRESTOR) 5 MG tablet Take 1 tablet (5 mg total) by mouth daily. (Patient not taking: Reported on 06/25/2020) 30 tablet 0  No facility-administered medications prior to visit.    No Known Allergies  ROS Review of Systems  Constitutional: Negative.   Respiratory: Negative.   Cardiovascular: Negative.   Genitourinary: Positive for frequency.       Reports that episodes of incontinence that she describes as "leaking"      Objective:    Physical Exam Constitutional:      Appearance: Normal appearance.  Cardiovascular:     Rate and Rhythm: Normal rate and regular rhythm.     Heart sounds: Normal heart sounds.  Pulmonary:     Effort: Pulmonary effort is normal.     Breath sounds: Normal breath sounds.   Neurological:     Mental Status: She is alert.     BP 122/76 (BP Location: Right Arm, Patient Position: Sitting)   Pulse 65   Ht 5' 7.5" (1.715 m)   Wt 162 lb 11.2 oz (73.8 kg)   SpO2 93%   BMI 25.11 kg/m  Wt Readings from Last 3 Encounters:  06/25/20 162 lb 11.2 oz (73.8 kg)  05/22/20 165 lb 9.6 oz (75.1 kg)  04/24/20 165 lb (74.8 kg)     Health Maintenance Due  Topic Date Due  . Hepatitis C Screening  Never done  . HIV Screening  Never done  . TETANUS/TDAP  Never done  . PAP SMEAR-Modifier  Never done  . MAMMOGRAM  Never done  . COLONOSCOPY  Never done  . INFLUENZA VACCINE  Never done    There are no preventive care reminders to display for this patient.  No results found for: TSH Lab Results  Component Value Date   WBC 7.7 05/14/2020   HGB 16.3 (H) 05/14/2020   HCT 47.2 (H) 05/14/2020   MCV 87 05/14/2020   PLT 353 05/14/2020   Lab Results  Component Value Date   NA 139 05/14/2020   K 4.3 05/14/2020   CO2 23 05/14/2020   GLUCOSE 90 05/14/2020   BUN 9 05/14/2020   CREATININE 0.59 05/14/2020   BILITOT 0.3 05/14/2020   ALKPHOS 99 05/14/2020   AST 14 05/14/2020   ALT 8 05/14/2020   PROT 7.0 05/14/2020   ALBUMIN 4.2 05/14/2020   CALCIUM 9.6 05/14/2020   ANIONGAP 10 04/10/2018   Lab Results  Component Value Date   CHOL 234 (H) 05/14/2020   Lab Results  Component Value Date   HDL 38 (L) 05/14/2020   Lab Results  Component Value Date   LDLCALC 155 (H) 05/14/2020   Lab Results  Component Value Date   TRIG 224 (H) 05/14/2020   Lab Results  Component Value Date   CHOLHDL 6.2 (H) 05/14/2020   Lab Results  Component Value Date   HGBA1C 6.0 (H) 05/14/2020      Assessment & Plan:   1. Urinary tract infection symptoms Monitor for hematuria or worsening incontinence symptoms. Continue Kegel exercises. If symptoms continue, referral to Urogynecology.  - Urinalysis, Routine w reflex microscopic; Future - UA/M w/rflx Culture, Routine;  Future  2. Elevated lipids Patient discontinued statin use, she was educated on the importance of taking this medication, she declined. Follow low fat, low cholesterol diet as outlined in discharge papers. Limit cholesterol and saturated fats. - Lipid panel; Future  3. Prediabetes Patient discontinued metformin, she was educated on the importance of taking this medication, she declined. Follow prediabetes diet as outlined in discharge papers. Limit carbohydrates and concentrated sugar.  - HgB A1c; Future   No orders of the defined types were  placed in this encounter.   Follow-up: Return in about 11 weeks (around 09/10/2020), or if symptoms worsen or fail to improve.    Marina Gravel, Student-NP

## 2020-06-25 NOTE — Addendum Note (Signed)
Addended by: Adah Perl on: 06/25/2020 04:39 PM   Modules accepted: Orders

## 2020-06-25 NOTE — Patient Instructions (Addendum)
Prediabetes Eating Plan Prediabetes is a condition that causes blood sugar (glucose) levels to be higher than normal. This increases the risk for developing diabetes. In order to prevent diabetes from developing, your health care provider may recommend a diet and other lifestyle changes to help you:  Control your blood glucose levels.  Improve your cholesterol levels.  Manage your blood pressure. Your health care provider may recommend working with a diet and nutrition specialist (dietitian) to make a meal plan that is best for you. What are tips for following this plan? Lifestyle  Set weight loss goals with the help of your health care team. It is recommended that most people with prediabetes lose 7% of their current body weight.  Exercise for at least 30 minutes at least 5 days a week.  Attend a support group or seek ongoing support from a mental health counselor.  Take over-the-counter and prescription medicines only as told by your health care provider. Reading food labels  Read food labels to check the amount of fat, salt (sodium), and sugar in prepackaged foods. Avoid foods that have: ? Saturated fats. ? Trans fats. ? Added sugars.  Avoid foods that have more than 300 milligrams (mg) of sodium per serving. Limit your daily sodium intake to less than 2,300 mg each day. Shopping  Avoid buying pre-made and processed foods. Cooking  Cook with olive oil. Do not use butter, lard, or ghee.  Bake, broil, grill, or boil foods. Avoid frying. Meal planning   Work with your dietitian to develop an eating plan that is right for you. This may include: ? Tracking how many calories you take in. Use a food diary, notebook, or mobile application to track what you eat at each meal. ? Using the glycemic index (GI) to plan your meals. The index tells you how quickly a food will raise your blood glucose. Choose low-GI foods. These foods take a longer time to raise blood glucose.  Consider  following a Mediterranean diet. This diet includes: ? Several servings each day of fresh fruits and vegetables. ? Eating fish at least twice a week. ? Several servings each day of whole grains, beans, nuts, and seeds. ? Using olive oil instead of other fats. ? Moderate alcohol consumption. ? Eating small amounts of red meat and whole-fat dairy.  If you have high blood pressure, you may need to limit your sodium intake or follow a diet such as the DASH eating plan. DASH is an eating plan that aims to lower high blood pressure. What foods are recommended? The items listed below may not be a complete list. Talk with your dietitian about what dietary choices are best for you. Grains Whole grains, such as whole-wheat or whole-grain breads, crackers, cereals, and pasta. Unsweetened oatmeal. Bulgur. Barley. Quinoa. Brown rice. Corn or whole-wheat flour tortillas or taco shells. Vegetables Lettuce. Spinach. Peas. Beets. Cauliflower. Cabbage. Broccoli. Carrots. Tomatoes. Squash. Eggplant. Herbs. Peppers. Onions. Cucumbers. Brussels sprouts. Fruits Berries. Bananas. Apples. Oranges. Grapes. Papaya. Mango. Pomegranate. Kiwi. Grapefruit. Cherries. Meats and other protein foods Seafood. Poultry without skin. Lean cuts of pork and beef. Tofu. Eggs. Nuts. Beans. Dairy Low-fat or fat-free dairy products, such as yogurt, cottage cheese, and cheese. Beverages Water. Tea. Coffee. Sugar-free or diet soda. Seltzer water. Lowfat or no-fat milk. Milk alternatives, such as soy or almond milk. Fats and oils Olive oil. Canola oil. Sunflower oil. Grapeseed oil. Avocado. Walnuts. Sweets and desserts Sugar-free or low-fat pudding. Sugar-free or low-fat ice cream and other frozen treats.  Seasoning and other foods Herbs. Sodium-free spices. Mustard. Relish. Low-fat, low-sugar ketchup. Low-fat, low-sugar barbecue sauce. Low-fat or fat-free mayonnaise. What foods are not recommended? The items listed below may not be a  complete list. Talk with your dietitian about what dietary choices are best for you. Grains Refined white flour and flour products, such as bread, pasta, snack foods, and cereals. Vegetables Canned vegetables. Frozen vegetables with butter or cream sauce. Fruits Fruits canned with syrup. Meats and other protein foods Fatty cuts of meat. Poultry with skin. Breaded or fried meat. Processed meats. Dairy Full-fat yogurt, cheese, or milk. Beverages Sweetened drinks, such as sweet iced tea and soda. Fats and oils Butter. Lard. Ghee. Sweets and desserts Baked goods, such as cake, cupcakes, pastries, cookies, and cheesecake. Seasoning and other foods Spice mixes with added salt. Ketchup. Barbecue sauce. Mayonnaise. Summary  To prevent diabetes from developing, you may need to make diet and other lifestyle changes to help control blood sugar, improve cholesterol levels, and manage your blood pressure.  Set weight loss goals with the help of your health care team. It is recommended that most people with prediabetes lose 7 percent of their current body weight.  Consider following a Mediterranean diet that includes plenty of fresh fruits and vegetables, whole grains, beans, nuts, seeds, fish, lean meat, low-fat dairy, and healthy oils. This information is not intended to replace advice given to you by your health care provider. Make sure you discuss any questions you have with your health care provider. Document Revised: 01/19/2019 Document Reviewed: 12/01/2016 Elsevier Patient Education  [place].  Prediabetes Prediabetes is the condition of having a blood sugar (blood glucose) level that is higher than it should be, but not high enough for you to be diagnosed with type 2 diabetes. Having prediabetes puts you at risk for developing type 2 diabetes (type 2 diabetes mellitus). Prediabetes may be called impaired glucose tolerance or impaired fasting glucose. Prediabetes usually does not  cause symptoms. Your health care provider can diagnose this condition with blood tests. You may be tested for prediabetes if you are overweight and if you have at least one other risk factor for prediabetes. What is blood glucose, and how is it measured? Blood glucose refers to the amount of glucose in your bloodstream. Glucose comes from eating foods that contain sugars and starches (carbohydrates), which the body breaks down into glucose. Your blood glucose level may be measured in mg/dL (milligrams per deciliter) or mmol/L (millimoles per liter). Your blood glucose may be checked with one or more of the following blood tests:  A fasting blood glucose (FBG) test. You will not be allowed to eat (you will fast) for 8 hours or longer before a blood sample is taken. ? A normal range for FBG is 70-100 mg/dl (3.9-5.6 mmol/L).  An A1c (hemoglobin A1c) blood test. This test provides information about blood glucose control over the previous 2?54months.  An oral glucose tolerance test (OGTT). This test measures your blood glucose at two times: ? After fasting. This is your baseline level. ? Two hours after you drink a beverage that contains glucose. You may be diagnosed with prediabetes:  If your FBG is 100?125 mg/dL (5.6-6.9 mmol/L).  If your A1c level is 5.7?6.4%.  If your OGTT result is 140?199 mg/dL (7.8-11 mmol/L). These blood tests may be repeated to confirm your diagnosis. How can this condition affect me? The pancreas produces a hormone (insulin) that helps to move glucose from the bloodstream into cells. When  cells in the body do not respond properly to insulin that the body makes (insulin resistance), excess glucose builds up in the blood instead of going into cells. As a result, high blood glucose (hyperglycemia) can develop, which can cause many complications. Hyperglycemia is a symptom of prediabetes. Having high blood glucose for a long time is dangerous. Too much glucose in your blood  can damage your nerves and blood vessels. Long-term damage can lead to complications from diabetes, which may include:  Heart disease.  Stroke.  Blindness.  Kidney disease.  Depression.  Poor circulation in the feet and legs, which could lead to surgical removal (amputation) in severe cases. What can increase my risk? Risk factors for prediabetes include:  Having a family member with type 2 diabetes.  Being overweight or obese.  Being older than age 28.  Being of American [place], African-American, Hispanic/Latino, or Asian/Pacific Islander descent.  Having an inactive (sedentary) lifestyle.  Having a history of heart disease.  History of gestational diabetes or polycystic ovary syndrome (PCOS), in women.  Having low levels of good cholesterol (HDL-C) or high levels of blood fats (triglycerides).  Having high blood pressure. What actions can I take to prevent diabetes?      Be physically active. ? Do moderate-intensity physical activity for 30 or more minutes on 5 or more days of the week, or as much as told by your health care provider. This could be brisk walking, biking, or water aerobics. ? Ask your health care provider what activities are safe for you. A mix of physical activities may be best, such as walking, swimming, cycling, and strength training.  Lose weight as told by your health care provider. ? Losing 5-7% of your body weight can reverse insulin resistance. ? Your health care provider can determine how much weight loss is best for you and can help you lose weight safely.  Follow a healthy meal plan. This includes eating lean proteins, complex carbohydrates, fresh fruits and vegetables, low-fat dairy products, and healthy fats. ? Follow instructions from your health care provider about eating or drinking restrictions. ? Make an appointment to see a diet and nutrition specialist (registered dietitian) to help you create a healthy eating plan that is right  for you.  Do not smoke or use any tobacco products, such as cigarettes, chewing tobacco, and e-cigarettes. If you need help quitting, ask your health care provider.  Take over-the-counter and prescription medicines as told by your health care provider. You may be prescribed medicines that help lower the risk of type 2 diabetes.  Keep all follow-up visits as told by your health care provider. This is important. Summary  Prediabetes is the condition of having a blood sugar (blood glucose) level that is higher than it should be, but not high enough for you to be diagnosed with type 2 diabetes.  Having prediabetes puts you at risk for developing type 2 diabetes (type 2 diabetes mellitus).  To help prevent type 2 diabetes, make lifestyle changes such as being physically active and eating a healthy diet. Lose weight as told by your health care provider. This information is not intended to replace advice given to you by your health care provider. Make sure you discuss any questions you have with your health care provider. Document Revised: 01/19/2019 Document Reviewed: 11/18/2015 Elsevier Patient Education  [place] and Cholesterol Restricted Eating Plan Getting too much fat and cholesterol in your diet may cause health problems. Choosing the right foods helps  keep your fat and cholesterol at normal levels. This can keep you from getting certain diseases. Your doctor may recommend an eating plan that includes:  Total fat: ______% or less of total calories a day.  Saturated fat: ______% or less of total calories a day.  Cholesterol: less than _________mg a day.  Fiber: ______g a day. What are tips for following this plan? Meal planning  At meals, divide your plate into four equal parts: ? Fill one-half of your plate with vegetables and green salads. ? Fill one-fourth of your plate with whole grains. ? Fill one-fourth of your plate with low-fat (lean) protein foods.  Eat  fish that is high in omega-3 fats at least two times a week. This includes mackerel, tuna, sardines, and salmon.  Eat foods that are high in fiber, such as whole grains, beans, apples, broccoli, carrots, peas, and barley. General tips   Work with your doctor to lose weight if you need to.  Avoid: ? Foods with added sugar. ? Fried foods. ? Foods with partially hydrogenated oils.  Limit alcohol intake to no more than 1 drink a day for nonpregnant women and 2 drinks a day for men. One drink equals 12 oz of beer, 5 oz of wine, or 1 oz of hard liquor. Reading food labels  Check food labels for: ? Trans fats. ? Partially hydrogenated oils. ? Saturated fat (g) in each serving. ? Cholesterol (mg) in each serving. ? Fiber (g) in each serving.  Choose foods with healthy fats, such as: ? Monounsaturated fats. ? Polyunsaturated fats. ? Omega-3 fats.  Choose grain products that have whole grains. Look for the word "whole" as the first word in the ingredient list. Cooking  Cook foods using low-fat methods. These include baking, boiling, grilling, and broiling.  Eat more home-cooked foods. Eat at restaurants and buffets less often.  Avoid cooking using saturated fats, such as butter, cream, palm oil, palm kernel oil, and coconut oil. Recommended foods  Fruits  All fresh, canned (in natural juice), or frozen fruits. Vegetables  Fresh or frozen vegetables (raw, steamed, roasted, or grilled). Green salads. Grains  Whole grains, such as whole wheat or whole grain breads, crackers, cereals, and pasta. Unsweetened oatmeal, bulgur, barley, quinoa, or brown rice. Corn or whole wheat flour tortillas. Meats and other protein foods  Ground beef (85% or leaner), grass-fed beef, or beef trimmed of fat. Skinless chicken or [place]. Ground chicken or [place]. Pork trimmed of fat. All fish and seafood. Egg whites. Dried beans, peas, or lentils. Unsalted nuts or seeds. Unsalted canned beans. Nut  butters without added sugar or oil. Dairy  Low-fat or nonfat dairy products, such as skim or 1% milk, 2% or reduced-fat cheeses, low-fat and fat-free ricotta or cottage cheese, or plain low-fat and nonfat yogurt. Fats and oils  Tub margarine without trans fats. Light or reduced-fat mayonnaise and salad dressings. Avocado. Olive, canola, sesame, or safflower oils. The items listed above may not be a complete list of foods and beverages you can eat. Contact a dietitian for more information. Foods to avoid Fruits  Canned fruit in heavy syrup. Fruit in cream or butter sauce. Fried fruit. Vegetables  Vegetables cooked in cheese, cream, or butter sauce. Fried vegetables. Grains  White bread. White pasta. White rice. Cornbread. Bagels, pastries, and croissants. Crackers and snack foods that contain trans fat and hydrogenated oils. Meats and other protein foods  Fatty cuts of meat. Ribs, chicken wings, bacon, sausage, bologna, salami, chitterlings, fatback, hot  dogs, bratwurst, and packaged lunch meats. Liver and organ meats. Whole eggs and egg yolks. Chicken and [place] with skin. Fried meat. Dairy  Whole or 2% milk, cream, half-and-half, and cream cheese. Whole milk cheeses. Whole-fat or sweetened yogurt. Full-fat cheeses. Nondairy creamers and whipped toppings. Processed cheese, cheese spreads, and cheese curds. Beverages  Alcohol. Sugar-sweetened drinks such as sodas, lemonade, and fruit drinks. Fats and oils  Butter, stick margarine, lard, shortening, ghee, or bacon fat. Coconut, palm kernel, and palm oils. Sweets and desserts  Corn syrup, sugars, honey, and molasses. Candy. Jam and jelly. Syrup. Sweetened cereals. Cookies, pies, cakes, donuts, muffins, and ice cream. The items listed above may not be a complete list of foods and beverages you should avoid. Contact a dietitian for more information. Summary  Choosing the right foods helps keep your fat and cholesterol at normal levels.  This can keep you from getting certain diseases.  At meals, fill one-half of your plate with vegetables and green salads.  Eat high-fiber foods, like whole grains, beans, apples, carrots, peas, and barley.  Limit added sugar, saturated fats, alcohol, and fried foods. This information is not intended to replace advice given to you by your health care provider. Make sure you discuss any questions you have with your health care provider. Document Revised: 05/31/2018 Document Reviewed: 06/14/2017 Elsevier Patient Education  [place].

## 2020-06-26 ENCOUNTER — Other Ambulatory Visit: Payer: Self-pay | Admitting: Gerontology

## 2020-06-26 DIAGNOSIS — R399 Unspecified symptoms and signs involving the genitourinary system: Secondary | ICD-10-CM

## 2020-06-30 LAB — URINE CULTURE, REFLEX

## 2020-06-30 LAB — UA/M W/RFLX CULTURE, ROUTINE
Bilirubin, UA: NEGATIVE
Glucose, UA: NEGATIVE
Ketones, UA: NEGATIVE
Nitrite, UA: NEGATIVE
Specific Gravity, UA: 1.022 (ref 1.005–1.030)
Urobilinogen, Ur: 0.2 mg/dL (ref 0.2–1.0)
pH, UA: 5 (ref 5.0–7.5)

## 2020-06-30 LAB — MICROSCOPIC EXAMINATION
Casts: NONE SEEN /lpf
Epithelial Cells (non renal): >10 /hpf — AB (ref 0–10)
RBC, Urine: NONE SEEN /hpf (ref 0–2)
WBC, UA: >30 /hpf — AB (ref 0–5)

## 2020-07-02 ENCOUNTER — Encounter: Payer: Self-pay | Admitting: *Deleted

## 2020-07-07 ENCOUNTER — Encounter: Payer: Self-pay | Admitting: Urology

## 2020-07-17 ENCOUNTER — Telehealth: Payer: Self-pay | Admitting: *Deleted

## 2020-07-17 DIAGNOSIS — Z122 Encounter for screening for malignant neoplasm of respiratory organs: Secondary | ICD-10-CM

## 2020-07-17 DIAGNOSIS — Z87891 Personal history of nicotine dependence: Secondary | ICD-10-CM

## 2020-07-17 NOTE — Telephone Encounter (Signed)
Received referral for initial lung cancer screening scan. Contacted patient and obtained smoking history,(current, 43 pack year) as well as answering questions related to screening process. Patient denies signs of lung cancer such as weight loss or hemoptysis. Patient denies comorbidity that would prevent curative treatment if lung cancer were found. Patient is scheduled for shared decision making visit and CT scan on 08/13/20 at 1030am.

## 2020-07-27 ENCOUNTER — Emergency Department: Payer: Self-pay

## 2020-07-27 ENCOUNTER — Emergency Department
Admission: EM | Admit: 2020-07-27 | Discharge: 2020-07-27 | Disposition: A | Discharge status: Home / Self Care | Payer: Self-pay | Attending: Student in an Organized Health Care Education/Training Program | Admitting: Student in an Organized Health Care Education/Training Program

## 2020-07-27 ENCOUNTER — Encounter: Payer: Self-pay | Admitting: Intensive Care

## 2020-07-27 ENCOUNTER — Other Ambulatory Visit: Payer: Self-pay

## 2020-07-27 DIAGNOSIS — I10 Essential (primary) hypertension: Secondary | ICD-10-CM | POA: Insufficient documentation

## 2020-07-27 DIAGNOSIS — F17210 Nicotine dependence, cigarettes, uncomplicated: Secondary | ICD-10-CM | POA: Insufficient documentation

## 2020-07-27 DIAGNOSIS — Z7984 Long term (current) use of oral hypoglycemic drugs: Secondary | ICD-10-CM | POA: Insufficient documentation

## 2020-07-27 DIAGNOSIS — R7309 Other abnormal glucose: Secondary | ICD-10-CM | POA: Insufficient documentation

## 2020-07-27 DIAGNOSIS — K2980 Duodenitis without bleeding: Secondary | ICD-10-CM | POA: Insufficient documentation

## 2020-07-27 DIAGNOSIS — Z79899 Other long term (current) drug therapy: Secondary | ICD-10-CM | POA: Insufficient documentation

## 2020-07-27 HISTORY — DX: Prediabetes: R73.03

## 2020-07-27 HISTORY — DX: Pure hypercholesterolemia, unspecified: E78.00

## 2020-07-27 LAB — COMPREHENSIVE METABOLIC PANEL
ALT: 9 U/L (ref 0–44)
AST: 15 U/L (ref 15–41)
Albumin: 4.2 g/dL (ref 3.5–5.0)
Alkaline Phosphatase: 90 U/L (ref 38–126)
Anion gap: 11 (ref 5–15)
BUN: 10 mg/dL (ref 6–20)
CO2: 24 mmol/L (ref 22–32)
Calcium: 9.5 mg/dL (ref 8.9–10.3)
Chloride: 100 mmol/L (ref 98–111)
Creatinine, Ser: 0.64 mg/dL (ref 0.44–1.00)
GFR, Estimated: >60 mL/min (ref >60)
Glucose, Bld: 116 mg/dL — ABNORMAL HIGH (ref 70–99)
Potassium: 3.7 mmol/L (ref 3.5–5.1)
Sodium: 135 mmol/L (ref 135–145)
Total Bilirubin: 0.8 mg/dL (ref 0.3–1.2)
Total Protein: 7.9 g/dL (ref 6.5–8.1)

## 2020-07-27 LAB — CBC
HCT: 48.1 % — ABNORMAL HIGH (ref 36.0–46.0)
Hemoglobin: 16.4 g/dL — ABNORMAL HIGH (ref 12.0–15.0)
MCH: 29.6 pg (ref 26.0–34.0)
MCHC: 34.1 g/dL (ref 30.0–36.0)
MCV: 86.8 fL (ref 80.0–100.0)
Platelets: 308 10*3/uL (ref 150–400)
RBC: 5.54 MIL/uL — ABNORMAL HIGH (ref 3.87–5.11)
RDW: 14.3 % (ref 11.5–15.5)
WBC: 11.2 10*3/uL — ABNORMAL HIGH (ref 4.0–10.5)
nRBC: 0 % (ref 0.0–0.2)

## 2020-07-27 LAB — LIPASE, BLOOD: Lipase: 21 U/L (ref 11–51)

## 2020-07-27 LAB — LACTIC ACID, PLASMA: Lactic Acid, Venous: 1.1 mmol/L (ref 0.5–1.9)

## 2020-07-27 MED ORDER — ONDANSETRON HCL 4 MG PO TABS: 4.0000 mg | ORAL_TABLET | Freq: Every day | ORAL | 0 refills | Status: DC | PRN

## 2020-07-27 MED ORDER — HYDROCODONE-ACETAMINOPHEN 5-325 MG PO TABS: 1.0000 | ORAL_TABLET | ORAL | 0 refills | Status: DC | PRN

## 2020-07-27 MED ORDER — MORPHINE SULFATE (PF) 4 MG/ML IV SOLN
4.0000 mg | INTRAVENOUS | Status: DC | PRN
  Administered 2020-07-27: 4 mg via INTRAVENOUS
  Filled 2020-07-27: qty 1

## 2020-07-27 MED ORDER — IOHEXOL 300 MG/ML  SOLN
100.0000 mL | Freq: Once | INTRAMUSCULAR | Status: AC | PRN
  Administered 2020-07-27: 100 mL via INTRAVENOUS

## 2020-07-27 MED ORDER — PANTOPRAZOLE SODIUM 40 MG IV SOLR
40.0000 mg | Freq: Once | INTRAVENOUS | Status: AC
  Administered 2020-07-27: 40 mg via INTRAVENOUS
  Filled 2020-07-27: qty 40

## 2020-07-27 MED ORDER — SUCRALFATE 1 G PO TABS: 1.0000 g | ORAL_TABLET | Freq: Three times a day (TID) | ORAL | 0 refills | Status: DC | PRN

## 2020-07-27 MED ORDER — PANTOPRAZOLE SODIUM 40 MG PO TBEC: 40.0000 mg | DELAYED_RELEASE_TABLET | Freq: Every day | ORAL | 1 refills | Status: DC

## 2020-07-27 MED ORDER — HYDROCODONE-ACETAMINOPHEN 5-325 MG PO TABS
1.0000 | ORAL_TABLET | Freq: Once | ORAL | Status: AC
  Administered 2020-07-27: 1 via ORAL
  Filled 2020-07-27: qty 1

## 2020-07-27 MED ORDER — SUCRALFATE 1 G PO TABS
1.0000 g | ORAL_TABLET | Freq: Once | ORAL | Status: AC
  Administered 2020-07-27: 1 g via ORAL
  Filled 2020-07-27: qty 1

## 2020-07-27 MED ORDER — ONDANSETRON HCL 4 MG/2ML IJ SOLN
4.0000 mg | Freq: Once | INTRAMUSCULAR | Status: AC
  Administered 2020-07-27: 4 mg via INTRAVENOUS
  Filled 2020-07-27: qty 2

## 2020-07-27 NOTE — ED Notes (Signed)
Medication administered per order due to patient c/o pain. Pt given remote to TV per her request, lights dimmed for patient comfort. Pt denies further needs at this time.

## 2020-07-27 NOTE — ED Notes (Signed)
NAD noted at time of D/C. Pt states significant other waiting outside for her to be discharged. Pt denies comments/concerns regarding D/C instructions at this time.

## 2020-07-27 NOTE — ED Notes (Signed)
Pt c/o mid epigastric abd pain x several days, states hx of hernia "20 years ago but never gave her any problems", pt states she thinks it may have ruptured. Pt A&O x4. Pt denies N/V/D at this time. NSR on the monitor. Call bell within reach at this time. IV initiated by this RN. Blood work obtained at this time.

## 2020-07-27 NOTE — ED Triage Notes (Signed)
Patient c/o upper abdominal pain since yesterday. Tender to touch. Denies N/V/D. Hx abdominal hernia

## 2020-07-27 NOTE — Discharge Instructions (Addendum)

## 2020-07-27 NOTE — ED Notes (Signed)
Blood work collected by Engineer, petroleum at this time. Pt resting in bed with eyes closed. Pt states pain decreased from 10 to a 5. Lights dimmed again by this RN at this time.

## 2020-07-27 NOTE — ED Provider Notes (Signed)
[facility] Emergency Department Provider Note    First MD Initiated Contact with Patient 07/27/20 [phone]     (approximate)  I have reviewed the triage vital signs and the nursing notes.   HISTORY  Chief Complaint Abdominal Pain    HPI Stacie Kennedy is a 59 y.o. female with the below listed past medical history presents to the ER for evaluation of midepigastric abdominal pain that she thinks is related to previous hernia.  States that she was pushing a cart at work yesterday and felt a popping sensation.  Has had burning discomfort since then.  Denies any vomiting has had some nausea.    Past Medical History:  Diagnosis Date  . High cholesterol   . Hypertension   . Pre-diabetes    History reviewed. No pertinent family history. History reviewed. No pertinent surgical history. Patient Active Problem List   Diagnosis Date Noted  . Health care maintenance 05/22/2020  . Prediabetes 05/22/2020  . Elevated lipids 05/22/2020  . Stress incontinence of urine 05/22/2020  . Encounter to establish care 04/24/2020  . Urinary tract infection symptoms 04/24/2020  . Smoking 04/24/2020      Prior to Admission medications   Medication Sig Start Date End Date Taking? Authorizing Provider  HYDROcodone-acetaminophen (NORCO) 5-325 MG tablet Take 1 tablet by mouth every 4 (four) hours as needed for moderate pain. 07/27/20   Merlyn Lot, MD  metFORMIN (GLUCOPHAGE) 500 MG tablet Take 1 tablet (500 mg total) by mouth daily with breakfast. Patient not taking: Reported on 06/25/2020 05/22/20   Iloabachie, Chioma E, NP  ondansetron (ZOFRAN) 4 MG tablet Take 1 tablet (4 mg total) by mouth daily as needed for nausea or vomiting. 07/27/20 07/27/21  Merlyn Lot, MD  pantoprazole (PROTONIX) 40 MG tablet Take 1 tablet (40 mg total) by mouth daily. 07/27/20 07/27/21  Merlyn Lot, MD  rosuvastatin (CRESTOR) 5 MG tablet Take 1 tablet (5 mg total) by mouth  daily. Patient not taking: Reported on 06/25/2020 05/22/20   Iloabachie, Chioma E, NP  sucralfate (CARAFATE) 1 g tablet Take 1 tablet (1 g total) by mouth 3 (three) times daily as needed. 07/27/20 07/27/21  Merlyn Lot, MD    Allergies Patient has no known allergies.    Social History Social History   Tobacco Use  . Smoking status: Current Every Day Smoker    Types: Cigarettes  . Smokeless tobacco: Never Used  Vaping Use  . Vaping Use: Never used  Substance Use Topics  . Alcohol use: Yes    Alcohol/week: 12.0 standard drinks    Types: 12 Cans of beer per week  . Drug use: No    Review of Systems Patient denies headaches, rhinorrhea, blurry vision, numbness, shortness of breath, chest pain, edema, cough, abdominal pain, nausea, vomiting, diarrhea, dysuria, fevers, rashes or hallucinations unless otherwise stated above in HPI. ____________________________________________   PHYSICAL EXAM:  VITAL SIGNS: Vitals:   07/27/20 1100 07/27/20 1130  BP: 127/77 (!) 141/78  Pulse: 64 61  Resp: 17 15  Temp:    SpO2: 90% 94%    Constitutional: Alert and oriented.  Eyes: Conjunctivae are normal.  Head: Atraumatic. Nose: No congestion/rhinnorhea. Mouth/Throat: Mucous membranes are moist.   Neck: No stridor. Painless ROM.  Cardiovascular: Normal rate, regular rhythm. Grossly normal heart sounds.  Good peripheral circulation. Respiratory: Normal respiratory effort.  No retractions. Lungs CTAB. Gastrointestinal: Soft and nontender. No distention. No abdominal bruits. No CVA tenderness. Genitourinary:  Musculoskeletal: No lower extremity tenderness nor  edema.  No joint effusions. Neurologic:  Normal speech and language. No gross focal neurologic deficits are appreciated. No facial droop Skin:  Skin is warm, dry and intact. No rash noted. Psychiatric: Mood and affect are normal. Speech and behavior are normal.  ____________________________________________   LABS (all labs  ordered are listed, but only abnormal results are displayed)  Results for orders placed or performed during the hospital encounter of 07/27/20 (from the past 24 hour(s))  Lipase, blood     Status: None   Collection Time: 07/27/20  9:22 AM  Result Value Ref Range   Lipase 21 11 - 51 U/L  Comprehensive metabolic panel     Status: Abnormal   Collection Time: 07/27/20  9:22 AM  Result Value Ref Range   Sodium 135 135 - 145 mmol/L   Potassium 3.7 3.5 - 5.1 mmol/L   Chloride 100 98 - 111 mmol/L   CO2 24 22 - 32 mmol/L   Glucose, Bld 116 (H) 70 - 99 mg/dL   BUN 10 6 - 20 mg/dL   Creatinine, Ser 0.64 0.44 - 1.00 mg/dL   Calcium 9.5 8.9 - 10.3 mg/dL   Total Protein 7.9 6.5 - 8.1 g/dL   Albumin 4.2 3.5 - 5.0 g/dL   AST 15 15 - 41 U/L   ALT 9 0 - 44 U/L   Alkaline Phosphatase 90 38 - 126 U/L   Total Bilirubin 0.8 0.3 - 1.2 mg/dL   GFR, Estimated >60 >60 mL/min   Anion gap 11 5 - 15  CBC     Status: Abnormal   Collection Time: 07/27/20  9:22 AM  Result Value Ref Range   WBC 11.2 (H) 4.0 - 10.5 K/uL   RBC 5.54 (H) 3.87 - 5.11 MIL/uL   Hemoglobin 16.4 (H) 12.0 - 15.0 g/dL   HCT 48.1 (H) 36 - 46 %   MCV 86.8 80.0 - 100.0 fL   MCH 29.6 26.0 - 34.0 pg   MCHC 34.1 30.0 - 36.0 g/dL   RDW 14.3 11.5 - 15.5 %   Platelets 308 150 - 400 K/uL   nRBC 0.0 0.0 - 0.2 %  Lactic acid, plasma     Status: None   Collection Time: 07/27/20 10:40 AM  Result Value Ref Range   Lactic Acid, Venous 1.1 0.5 - 1.9 mmol/L   ____________________________________________  EKG My review and personal interpretation at Time: 9:02   Indication: abd pain  Rate: 70  Rhythm: sinus Axis: normal Other: normal intervals, no stemi ____________________________________________  RADIOLOGY  I personally reviewed all radiographic images ordered to evaluate for the above acute complaints and reviewed radiology reports and findings.  These findings were personally discussed with the patient.  Please see medical record for  radiology report.  ____________________________________________   PROCEDURES  Procedure(s) performed:  Procedures    Critical Care performed: no ____________________________________________   INITIAL IMPRESSION / ASSESSMENT AND PLAN / ED COURSE  Pertinent labs & imaging results that were available during my care of the patient were reviewed by me and considered in my medical decision making (see chart for details).   DDX: Enteritis, gastritis, pancreatitis, cholelithiasis, cholecystitis, hernia  Curry Dulski is a 59 y.o. who presents to the ED with presentation as described above.  Patient presenting uncomfortable with palpable ventral supraumbilical hernia.  Will provide IV narcotic pain medication check blood work give IV fluids and attempt reduction.  Clinical Course as of Jul 27 1330  Sun Jul 27, 2020  1033 Have  attempted to reduce the patient's hernia.  I do feel like a piece roughly the size of an uncle did reduce and decompress the patient felt some relief but she is having quite a bit of pain there.  Will order CT imaging to further characterize.   [PR]  1330 Patient symptoms improved.  CT imaging shows no evidence of obstruction does show evidence of some duodenitis.  She is not having signs or symptoms of bleeding.  No sign of perforation.  Received Protonix as well as Carafate as well as p.o. medication she is tolerating p.o.  At this point he believe she stable appropriate for outpatient management.  Discussed signs and symptoms for which she should return to the ER or seek medical attention sooner.   [PR]    Clinical Course User Index [PR] Merlyn Lot, MD    The patient was evaluated in Emergency Department today for the symptoms described in the history of present illness. He/she was evaluated in the context of the global COVID-19 pandemic, which necessitated consideration that the patient might be at risk for infection with the SARS-CoV-2 virus that causes  COVID-19. Institutional protocols and algorithms that pertain to the evaluation of patients at risk for COVID-19 are in a state of rapid change based on information released by regulatory bodies including the CDC and federal and state organizations. These policies and algorithms were followed during the patient's care in the ED.  As part of my medical decision making, I reviewed the following data within the [facility] notes reviewed and incorporated, Labs reviewed, notes from prior ED visits and [place] Controlled Substance Database   ____________________________________________   FINAL CLINICAL IMPRESSION(S) / ED DIAGNOSES  Final diagnoses:  Duodenitis      NEW MEDICATIONS STARTED DURING THIS VISIT:  New Prescriptions   HYDROCODONE-ACETAMINOPHEN (NORCO) 5-325 MG TABLET    Take 1 tablet by mouth every 4 (four) hours as needed for moderate pain.   ONDANSETRON (ZOFRAN) 4 MG TABLET    Take 1 tablet (4 mg total) by mouth daily as needed for nausea or vomiting.   PANTOPRAZOLE (PROTONIX) 40 MG TABLET    Take 1 tablet (40 mg total) by mouth daily.   SUCRALFATE (CARAFATE) 1 G TABLET    Take 1 tablet (1 g total) by mouth 3 (three) times daily as needed.     Note:  This document was prepared using Dragon voice recognition software and may include unintentional dictation errors.    Merlyn Lot, MD 07/27/20 1331

## 2020-07-27 NOTE — ED Notes (Signed)
Pt awakens easily with this RN to bedside for medication administration. Medications administered per order. Pt denies further needs. Pt resting in bed with NAD noted at this time. VSS.

## 2020-08-12 ENCOUNTER — Encounter: Payer: Self-pay | Admitting: Hospice and Palliative Medicine

## 2020-08-13 ENCOUNTER — Inpatient Hospital Stay: Payer: Self-pay | Attending: Oncology | Admitting: Hospice and Palliative Medicine

## 2020-08-13 ENCOUNTER — Ambulatory Visit
Admission: RE | Admit: 2020-08-13 | Discharge: 2020-08-13 | Disposition: A | Discharge status: Home / Self Care | Payer: Self-pay | Source: Ambulatory Visit | Attending: Oncology | Admitting: Oncology

## 2020-08-13 ENCOUNTER — Telehealth: Payer: Self-pay | Admitting: *Deleted

## 2020-08-13 ENCOUNTER — Other Ambulatory Visit: Payer: Self-pay

## 2020-08-13 DIAGNOSIS — Z87891 Personal history of nicotine dependence: Secondary | ICD-10-CM | POA: Insufficient documentation

## 2020-08-13 DIAGNOSIS — Z122 Encounter for screening for malignant neoplasm of respiratory organs: Secondary | ICD-10-CM

## 2020-08-13 NOTE — Progress Notes (Signed)
CT Screening Visit  Patient arrived at clinic for visit despite being scheduled for virtual visit. I met with her to discuss CT procedure and answered all questions.    I discussed the limitations of evaluation and management by telemedicine and the availability of in person appointments. The patient expressed understanding and agreed to proceed.  In accordance with CMS guidelines, patient has met eligibility criteria including age, absence of signs or symptoms of lung cancer.  Social History   Tobacco Use  . Smoking status: Current Every Day Smoker    Packs/day: 1.00    Years: 43.00    Pack years: 43.00    Types: Cigarettes  . Smokeless tobacco: Never Used  Vaping Use  . Vaping Use: Never used  Substance Use Topics  . Alcohol use: Yes    Alcohol/week: 12.0 standard drinks    Types: 12 Cans of beer per week  . Drug use: No      A shared decision-making session was conducted prior to the performance of CT scan. This includes one or more decision aids, includes benefits and harms of screening, follow-up diagnostic testing, over-diagnosis, false positive rate, and total radiation exposure.   Counseling on the importance of adherence to annual lung cancer LDCT screening, impact of co-morbidities, and ability or willingness to undergo diagnosis and treatment is imperative for compliance of the program.   Counseling on the importance of continued smoking cessation for former smokers; the importance of smoking cessation for current smokers, and information about tobacco cessation interventions have been given to patient including [place] and [facility] quit [place] programs.   Written order for lung cancer screening with LDCT has been given to the patient and any and all questions have been answered to the best of my abilities.    Yearly follow up will be coordinated by Burgess Estelle, Thoracic Navigator.  Time Total: 15 minutes  Visit consisted of counseling and education dealing  with complex health screening. Greater than 50%  of this time was spent counseling and coordinating care related to the above assessment and plan.  Signed by: Altha Harm, PhD, NP-C

## 2020-08-13 NOTE — Telephone Encounter (Signed)
Called report  IMPRESSION: 1. Lung-RADS 4B, suspicious. Additional imaging evaluation or consultation with Pulmonology or Thoracic Surgery recommended. 2. Stable left adrenal adenoma. 3. Aortic Atherosclerosis (ICD10-I70.0) and Emphysema (ICD10-J43.9).  These results will be called to the ordering clinician or representative by the Radiologist Assistant, and communication documented in the PACS or [employer].   Electronically Signed   By: Misty Stanley M.D.   On: 08/13/2020 13:11

## 2020-08-14 ENCOUNTER — Encounter: Payer: Self-pay | Admitting: *Deleted

## 2020-08-14 ENCOUNTER — Telehealth: Payer: Self-pay | Admitting: *Deleted

## 2020-08-14 NOTE — Telephone Encounter (Signed)
Tried again unsuccessfully to contact patient or leave a voicemail. Will try again in the morning.

## 2020-08-14 NOTE — Telephone Encounter (Signed)
Attempted to contact patient to review lung screening results. However, there is no answer or voicemail option available.

## 2020-08-15 ENCOUNTER — Other Ambulatory Visit: Payer: Self-pay | Admitting: *Deleted

## 2020-08-15 DIAGNOSIS — R911 Solitary pulmonary nodule: Secondary | ICD-10-CM

## 2020-08-15 NOTE — Progress Notes (Signed)
Contacted and reviewed CT scan results noted below with recommendation for PET scan. Appointment given for PET scan 08/20/20 with 11am arrival. Patient verbalizes understanding.   IMPRESSION: 1. Lung-RADS 4B, suspicious. Additional imaging evaluation or consultation with Pulmonology or Thoracic Surgery recommended. 2. Stable left adrenal adenoma. 3. Aortic Atherosclerosis (ICD10-I70.0) and Emphysema (ICD10-J43.9).

## 2020-08-15 NOTE — Telephone Encounter (Signed)
Contacted patient's sister and asked her to have patient call me.

## 2020-08-20 ENCOUNTER — Ambulatory Visit
Admission: RE | Admit: 2020-08-20 | Discharge: 2020-08-20 | Disposition: A | Discharge status: Home / Self Care | Payer: Self-pay | Source: Ambulatory Visit | Attending: Nurse Practitioner | Admitting: Nurse Practitioner

## 2020-08-20 ENCOUNTER — Other Ambulatory Visit: Payer: Self-pay

## 2020-08-20 DIAGNOSIS — J439 Emphysema, unspecified: Secondary | ICD-10-CM | POA: Insufficient documentation

## 2020-08-20 DIAGNOSIS — I700 Atherosclerosis of aorta: Secondary | ICD-10-CM | POA: Insufficient documentation

## 2020-08-20 DIAGNOSIS — R911 Solitary pulmonary nodule: Secondary | ICD-10-CM | POA: Insufficient documentation

## 2020-08-20 LAB — GLUCOSE, CAPILLARY: Glucose-Capillary: 96 mg/dL (ref 70–99)

## 2020-08-20 MED ORDER — FLUDEOXYGLUCOSE F - 18 (FDG) INJECTION
8.4000 | Freq: Once | INTRAVENOUS | Status: AC | PRN
  Administered 2020-08-20: 8.62 via INTRAVENOUS

## 2020-08-21 ENCOUNTER — Telehealth: Payer: Self-pay | Admitting: *Deleted

## 2020-08-21 DIAGNOSIS — R918 Other nonspecific abnormal finding of lung field: Secondary | ICD-10-CM

## 2020-08-21 NOTE — Telephone Encounter (Signed)
Contacted patient and reviewed PET scan results including PET positive lung mass with the recommendation from pulmonary for patient to be referred directly to thoracic surgery, specifically with the [facility] thoracic surgery location due to their ability to do robotic surgery as well as endobronchial biopsy at time of surgery if needed.   Patient is in agreement with this plan. Discussed financial barriers and uninsured status at length. Patient is given information about several options for assistance and we will discuss further as her workup proceeds.   Patient knows to call me for any questions or concerns.

## 2020-08-21 NOTE — Addendum Note (Signed)
Addended by: Lieutenant Diego on: 08/21/2020 11:37 AM   Modules accepted: Orders

## 2020-08-21 NOTE — Telephone Encounter (Signed)
Attempted to contact patient to review results of PET scan with recommendation for referral to thoracic surgery. However, there is no answer or voicemail option.

## 2020-08-25 ENCOUNTER — Encounter: Payer: Self-pay | Admitting: Urology

## 2020-08-25 ENCOUNTER — Ambulatory Visit (INDEPENDENT_AMBULATORY_CARE_PROVIDER_SITE_OTHER): Payer: Self-pay | Admitting: Urology

## 2020-08-25 ENCOUNTER — Other Ambulatory Visit: Payer: Self-pay

## 2020-08-25 VITALS — BP 122/73 | HR 90 | Ht 67.5 in | Wt 163.0 lb

## 2020-08-25 DIAGNOSIS — R310 Gross hematuria: Secondary | ICD-10-CM

## 2020-08-25 MED ORDER — METRONIDAZOLE 500 MG PO TABS: 500.0000 mg | ORAL_TABLET | Freq: Two times a day (BID) | ORAL | 0 refills | Status: DC

## 2020-08-25 NOTE — Patient Instructions (Signed)
Cystoscopy Cystoscopy is a procedure that is used to help diagnose and sometimes treat conditions that affect the lower urinary tract. The lower urinary tract includes the bladder and the urethra. The urethra is the tube that drains urine from the bladder. Cystoscopy is done using a thin, tube-shaped instrument with a light and camera at the end (cystoscope). The cystoscope may be hard or flexible, depending on the goal of the procedure. The cystoscope is inserted through the urethra, into the bladder. Cystoscopy may be recommended if you have:  Urinary tract infections that keep coming back.  Blood in the urine (hematuria).  An inability to control when you urinate (urinary incontinence) or an overactive bladder.  Unusual cells found in a urine sample.  A blockage in the urethra, such as a urinary stone.  Painful urination.  An abnormality in the bladder found during an intravenous pyelogram (IVP) or CT scan. Cystoscopy may also be done to remove a sample of tissue to be examined under a microscope (biopsy). What are the risks? Generally, this is a safe procedure. However, problems may occur, including:  Infection.  Bleeding.  What happens during the procedure?  1. You will be given one or more of the following: ? A medicine to numb the area (local anesthetic). 2. The area around the opening of your urethra will be cleaned. 3. The cystoscope will be passed through your urethra into your bladder. 4. Germ-free (sterile) fluid will flow through the cystoscope to fill your bladder. The fluid will stretch your bladder so that your health care provider can clearly examine your bladder walls. 5. Your doctor will look at the urethra and bladder. 6. The cystoscope will be removed The procedure may vary among health care providers  What can I expect after the procedure? After the procedure, it is common to have: 1. Some soreness or pain in your abdomen and urethra. 2. Urinary symptoms.  These include: ? Mild pain or burning when you urinate. Pain should stop within a few minutes after you urinate. This may last for up to 1 week. ? A small amount of blood in your urine for several days. ? Feeling like you need to urinate but producing only a small amount of urine. Follow these instructions at home: General instructions  Return to your normal activities as told by your health care provider.   Do not drive for 24 hours if you were given a sedative during your procedure.  Watch for any blood in your urine. If the amount of blood in your urine increases, call your health care provider.  If a tissue sample was removed for testing (biopsy) during your procedure, it is up to you to get your test results. Ask your health care provider, or the department that is doing the test, when your results will be ready.  Drink enough fluid to keep your urine pale yellow.  Keep all follow-up visits as told by your health care provider. This is important. Contact a health care provider if you:  Have pain that gets worse or does not get better with medicine, especially pain when you urinate.  Have trouble urinating.  Have more blood in your urine. Get help right away if you:  Have blood clots in your urine.  Have abdominal pain.  Have a fever or chills.  Are unable to urinate. Summary  Cystoscopy is a procedure that is used to help diagnose and sometimes treat conditions that affect the lower urinary tract.  Cystoscopy is done using   a thin, tube-shaped instrument with a light and camera at the end.  After the procedure, it is common to have some soreness or pain in your abdomen and urethra.  Watch for any blood in your urine. If the amount of blood in your urine increases, call your health care provider.  If you were prescribed an antibiotic medicine, take it as told by your health care provider. Do not stop taking the antibiotic even if you start to feel better. This  information is not intended to replace advice given to you by your health care provider. Make sure you discuss any questions you have with your health care provider. Document Revised: 09/19/2018 Document Reviewed: 09/19/2018 Elsevier Patient Education  2020 Elsevier Inc.   

## 2020-08-25 NOTE — Progress Notes (Signed)
08/25/2020 1:05 PM   Stacie Kennedy [birth year] [id number]  Referring provider: Langston Reusing, NP [place],  [place] [address]  Chief Complaint  Patient presents with  . Recurrent UTI    HPI: The patient's history was a bit difficult but appears she recently had a bladder infection with discomfort hematuria foul-smelling urine and frequency that responded to an antibiotic.  She thinks she gets about one infection a year and thinks her symptoms may have normalized but has not sure she still infected.  She voids every 3 hours and gets up once a night.  When she is not infected she intermittently has urge incontinence not wearing a pad.  No bedwetting.  No stress incontinence.  He has smoked  She had a recent CT scan of the abdomen pelvis with contrast and normal kidneys.  A few months ago she had a positive culture in the medical record  No history of kidney stones or bladder surgery   PMH: Past Medical History:  Diagnosis Date  . High cholesterol   . Hypertension   . Pre-diabetes     Surgical History: No past surgical history on file.  Home Medications:  Allergies as of 08/25/2020   No Known Allergies     Medication List       Accurate as of August 25, 2020  1:05 PM. If you have any questions, ask your nurse or doctor.        HYDROcodone-acetaminophen 5-325 MG tablet Commonly known as: Norco Take 1 tablet by mouth every 4 (four) hours as needed for moderate pain.   metFORMIN 500 MG tablet Commonly known as: GLUCOPHAGE Take 1 tablet (500 mg total) by mouth daily with breakfast.   ondansetron 4 MG tablet Commonly known as: Zofran Take 1 tablet (4 mg total) by mouth daily as needed for nausea or vomiting.   pantoprazole 40 MG tablet Commonly known as: Protonix Take 1 tablet (40 mg total) by mouth daily.   rosuvastatin 5 MG tablet Commonly known as: CRESTOR Take 1 tablet (5 mg total) by mouth daily.   sucralfate 1 g  tablet Commonly known as: Carafate Take 1 tablet (1 g total) by mouth 3 (three) times daily as needed.       Allergies: No Known Allergies  Family History: No family history on file.  Social History:  reports that she has been smoking cigarettes. She has a 43.00 pack-year smoking history. She has never used smokeless tobacco. She reports current alcohol use of about 12.0 standard drinks of alcohol per week. She reports that she does not use drugs.  ROS:                                        Physical Exam: There were no vitals taken for this visit.  Constitutional:  Alert and oriented, No acute distress. HEENT: [name] AT, moist mucus membranes.  Trachea midline, no masses. Cardiovascular: No clubbing, cyanosis, or edema. Respiratory: Normal respiratory effort, no increased work of breathing. GI: Abdomen is soft, nontender, nondistended, no abdominal masses GU: No CVA tenderness.   Skin: No rashes, bruises or suspicious lesions. Lymph: No cervical or inguinal adenopathy. Neurologic: Grossly intact, no focal deficits, moving all 4 extremities. Psychiatric: Normal mood and affect.  Laboratory Data: Lab Results  Component Value Date   WBC 11.2 (H) 07/27/2020   HGB 16.4 (H) 07/27/2020  HCT 48.1 (H) 07/27/2020   MCV 86.8 07/27/2020   PLT 308 07/27/2020    Lab Results  Component Value Date   CREATININE 0.64 07/27/2020    No results found for: PSA  No results found for: TESTOSTERONE  Lab Results  Component Value Date   HGBA1C 6.0 (H) 05/14/2020    Urinalysis    Component Value Date/Time   COLORURINE YELLOW (A) 08/08/2019 1401   APPEARANCEUR Cloudy (A) 06/25/2020 1330   LABSPEC 1.012 08/08/2019 1401   PHURINE 5.0 08/08/2019 1401   GLUCOSEU Negative 06/25/2020 1330   HGBUR MODERATE (A) 08/08/2019 1401   BILIRUBINUR Negative 06/25/2020 1330   KETONESUR NEGATIVE 08/08/2019 1401   PROTEINUR Trace 06/25/2020 1330   PROTEINUR 30 (A) 08/08/2019  1401   NITRITE Negative 06/25/2020 1330   NITRITE POSITIVE (A) 08/08/2019 1401   LEUKOCYTESUR 2+ (A) 06/25/2020 1330   LEUKOCYTESUR LARGE (A) 08/08/2019 1401    Pertinent Imaging: Reviewed chart.  Urine reviewed.  Urine sent for culture     Assessment & Plan: I think is best for safety reasons because of smoking history to perform cystoscopy next visit.  Otherwise I think she'll be happy with reassurance with a normal work-up for infections or blood.  The patient had a blood cells trichomonas and bacteria in the urine.  I gave her Flagyl for a week and she will come back  for cystoscopy.  Call if urine culture is otherwise positive.  I wrote down trichomonas for her and she will speak to her asymptomatic single partner.  She understands that the trichomonas could be present in 1 or both of them for many years  There are no diagnoses linked to this encounter.  No follow-ups on file.  Reece Packer, MD  [facility] [address] [place], [place] [address] [phone]

## 2020-08-26 LAB — URINALYSIS, COMPLETE
Bilirubin, UA: NEGATIVE
Glucose, UA: NEGATIVE
Ketones, UA: NEGATIVE
Nitrite, UA: NEGATIVE
Specific Gravity, UA: >1.03 — ABNORMAL HIGH (ref 1.005–1.030)
Urobilinogen, Ur: 0.2 mg/dL (ref 0.2–1.0)
pH, UA: 5 (ref 5.0–7.5)

## 2020-08-26 LAB — MICROSCOPIC EXAMINATION: Epithelial Cells (non renal): >10 /hpf — AB (ref 0–10)

## 2020-08-27 ENCOUNTER — Other Ambulatory Visit: Payer: Self-pay

## 2020-08-27 DIAGNOSIS — E785 Hyperlipidemia, unspecified: Secondary | ICD-10-CM

## 2020-08-27 DIAGNOSIS — R7303 Prediabetes: Secondary | ICD-10-CM

## 2020-08-27 LAB — CULTURE, URINE COMPREHENSIVE

## 2020-08-28 ENCOUNTER — Other Ambulatory Visit: Payer: Self-pay | Admitting: *Deleted

## 2020-08-28 ENCOUNTER — Encounter: Payer: Self-pay | Admitting: Thoracic Surgery (Cardiothoracic Vascular Surgery)

## 2020-08-28 ENCOUNTER — Institutional Professional Consult (permissible substitution) (INDEPENDENT_AMBULATORY_CARE_PROVIDER_SITE_OTHER): Payer: Self-pay | Admitting: Thoracic Surgery (Cardiothoracic Vascular Surgery)

## 2020-08-28 DIAGNOSIS — R918 Other nonspecific abnormal finding of lung field: Secondary | ICD-10-CM | POA: Insufficient documentation

## 2020-08-28 DIAGNOSIS — R911 Solitary pulmonary nodule: Secondary | ICD-10-CM

## 2020-08-28 LAB — LIPID PANEL
Chol/HDL Ratio: 5.8 ratio — ABNORMAL HIGH (ref 0.0–4.4)
Cholesterol, Total: 220 mg/dL — ABNORMAL HIGH (ref 100–199)
HDL: 38 mg/dL — ABNORMAL LOW (ref >39)
LDL Chol Calc (NIH): 151 mg/dL — ABNORMAL HIGH (ref 0–99)
Triglycerides: 170 mg/dL — ABNORMAL HIGH (ref 0–149)
VLDL Cholesterol Cal: 31 mg/dL (ref 5–40)

## 2020-08-28 LAB — HEMOGLOBIN A1C
Est. average glucose Bld gHb Est-mCnc: 131 mg/dL
Hgb A1c MFr Bld: 6.2 % — ABNORMAL HIGH (ref 4.8–5.6)

## 2020-08-28 NOTE — H&P (View-Only) (Signed)
PCP is Default, Provider, MD Referring Provider is Verlon Au, NP  Chief Complaint  Patient presents with  . Lung Mass    new patient consultation, PET 08/20/20, Chest CT 08/13/20    HPI: Stacie Kennedy is sent for consultation regarding a right upper lobe lung nodule  Stacie Kennedy is a 59 year old woman with a history of hypertension, hyperlipidemia, prediabetes, and tobacco abuse.  She has smoked up to 1 pack/day for 43 years.  She recently had a low-dose CT for lung cancer screening.  That showed a 1.5 cm right upper lobe nodule.  A PET/CT showed the nodule was hypermetabolic with an SUV of 7.9.  There was no evidence of regional or distant metastatic disease.  She did have an incidentally noted adrenal adenoma that had been present on a prior abdominal CT.  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  She is not having any chest pain, pressure, or tightness.  She denies shortness of breath except with heavy exertion.  She has not had any change in appetite or weight loss.  No unusual headaches or visual changes.  She has had a recent urinary tract infection.  Zubrod Score: At the time of surgery this patient's most appropriate activity status/level should be described as: [x]     0    Normal activity, no symptoms []     1    Restricted in physical strenuous activity but ambulatory, able to do out light work []     2    Ambulatory and capable of self care, unable to do work activities, up and about >50 % of waking hours                              []     3    Only limited self care, in bed greater than 50% of waking hours []     4    Completely disabled, no self care, confined to bed or chair []     5    Moribund  Past Medical History:  Diagnosis Date  . High cholesterol   . Hypertension   . Pre-diabetes     History reviewed. No pertinent surgical history.  History reviewed. No pertinent family history.  Social History Social History   Tobacco Use  . Smoking status:  Current Every Day Smoker    Packs/day: 1.00    Years: 43.00    Pack years: 43.00    Types: Cigarettes  . Smokeless tobacco: Never Used  Vaping Use  . Vaping Use: Never used  Substance Use Topics  . Alcohol use: Yes    Alcohol/week: 12.0 standard drinks    Types: 12 Cans of beer per week  . Drug use: No    Current Outpatient Medications  Medication Sig Dispense Refill  . metroNIDAZOLE (FLAGYL) 500 MG tablet Take 1 tablet (500 mg total) by mouth 2 (two) times daily. 14 tablet 0  . naproxen sodium (ALEVE) 220 MG tablet Take 220 mg by mouth daily as needed.     No current facility-administered medications for this visit.    No Known Allergies  Review of Systems  Constitutional: Negative for activity change, fatigue and unexpected weight change.  HENT: Positive for dental problem (Dentures). Negative for trouble swallowing and voice change.   Eyes: Negative for visual disturbance.  Respiratory: Positive for cough (Occasional). Negative for shortness of breath and wheezing (Rare).   Cardiovascular: Negative  for chest pain and leg swelling.  Gastrointestinal: Negative for abdominal distention and abdominal pain.  Genitourinary: Positive for dysuria.  Musculoskeletal: Positive for arthralgias (Right knee).       Leg cramps  Neurological: Negative for seizures, syncope and weakness.  Hematological: Negative for adenopathy. Does not bruise/bleed easily.    BP 114/75 (BP Location: Left Arm, Patient Position: Sitting, Cuff Size: Normal)   Pulse 82   Temp 98.1 F (36.7 C) (Skin)   Resp 20   Ht 5' 7.5" (1.715 m)   Wt 160 lb (72.6 kg)   SpO2 95% Comment: RA  BMI 24.69 kg/m  Physical Exam Vitals reviewed.  Constitutional:      General: She is not in acute distress.    Appearance: Normal appearance.  HENT:     Head: Normocephalic and atraumatic.  Eyes:     General: No scleral icterus.    Extraocular Movements: Extraocular movements intact.  Cardiovascular:     Rate and  Rhythm: Normal rate and regular rhythm.     Heart sounds: Normal heart sounds. No murmur heard.  No friction rub. No gallop.   Pulmonary:     Effort: Pulmonary effort is normal. No respiratory distress.     Breath sounds: Normal breath sounds. No wheezing or rales.  Abdominal:     General: There is no distension.     Palpations: Abdomen is soft.  Musculoskeletal:        General: No swelling.     Cervical back: Neck supple.  Lymphadenopathy:     Cervical: No cervical adenopathy.  Skin:    General: Skin is warm and dry.  Neurological:     General: No focal deficit present.     Mental Status: She is alert and oriented to person, place, and time.     Cranial Nerves: No cranial nerve deficit.     Motor: No weakness.     Gait: Gait normal.    Diagnostic Tests: CT CHEST WITHOUT CONTRAST LOW-DOSE FOR LUNG CANCER SCREENING  TECHNIQUE: Multidetector CT imaging of the chest was performed following the standard protocol without IV contrast.  COMPARISON:  None.  FINDINGS: Cardiovascular: The heart size is normal. No substantial pericardial effusion. Coronary artery calcification is evident. Atherosclerotic calcification is noted in the wall of the thoracic aorta.  Mediastinum/Nodes: No mediastinal lymphadenopathy. No evidence for gross hilar lymphadenopathy although assessment is limited by the lack of intravenous contrast on today's study. The esophagus has normal imaging features. There is no axillary lymphadenopathy.  Lungs/Pleura: Centrilobular emphsyema noted. 15.6 mm highly suspicious lobulated peripheral right upper lobe pulmonary nodule identified on image 92. Atelectasis or scarring noted medial right middle lobe. No other suspicious pulmonary nodule or mass. No focal airspace consolidation. No pleural effusion.  Upper Abdomen: 2.8 cm left adrenal nodule is stable since CT abdomen 04/10/2018 and measures low-density today consistent with adenoma. Epigastric  midline ventral hernia contains only fat with hernia sac measuring approximately 5.4 x 3.4 cm on axial imaging.  Musculoskeletal: No worrisome lytic or sclerotic osseous abnormality.  IMPRESSION: 1. Lung-RADS 4B, suspicious. Additional imaging evaluation or consultation with Pulmonology or Thoracic Surgery recommended. 2. Stable left adrenal adenoma. 3. Aortic Atherosclerosis (ICD10-I70.0) and Emphysema (ICD10-J43.9).  These results will be called to the ordering clinician or representative by the Radiologist Assistant, and communication documented in the PACS or [employer].   Electronically Signed   By: Misty Stanley M.D.   On: 08/13/2020 13:11 NUCLEAR MEDICINE PET SKULL BASE TO THIGH  TECHNIQUE: 8.62 mCi F-18 FDG was injected intravenously. Full-ring PET imaging was performed from the skull base to thigh after the radiotracer. CT data was obtained and used for attenuation correction and anatomic localization.  Fasting blood glucose: 96 mg/dl  COMPARISON:  Chest CT 08/13/2020  FINDINGS: Mediastinal blood pool activity: SUV max 2.50  Liver activity: SUV max NA  NECK: No hypermetabolic lymph nodes in the neck.  Incidental CT findings: none  CHEST: 18 mm peripheral right upper lobe pulmonary nodule is hypermetabolic with SUV max of 7.03.  No enlarged or hypermetabolic mediastinal or hilar lymph nodes.  No other pulmonary lesions are identified.  No breast masses, supraclavicular or axillary adenopathy.  Incidental CT findings: Underlying emphysematous changes.  Age advanced three-vessel coronary artery calcifications and scattered aortic calcifications.  ABDOMEN/PELVIS: No abnormal hypermetabolic activity within the liver, pancreas, adrenal glands, or spleen. No hypermetabolic lymph nodes in the abdomen or pelvis.  Incidental CT findings: Age advanced atherosclerotic calcifications involving the abdominal aorta and iliac arteries but  no aneurysm.  Upper abdominal ventral wall hernia containing fat. There is also a small periumbilical abdominal wall hernia containing fat.  Calcified uterine fibroids are noted.  SKELETON: No focal hypermetabolic activity to suggest skeletal metastasis.  Incidental CT findings: none  IMPRESSION: 1. 18 mm right upper lobe pulmonary nodule is hypermetabolic and consistent with primary lung neoplasm. 2. No findings for metastatic adenopathy or metastatic disease elsewhere. 3. Emphysematous changes and age advanced atherosclerotic disease.   Electronically Signed   By: Marijo Sanes M.D.   On: 08/21/2020 05:21 I personally reviewed the CT and PET/CT images and concur with the findings noted above  Impression: Stacie Kennedy is a 59 year old woman with a history of hypertension, hyperlipidemia, prediabetes, and tobacco abuse.  She has about a 40-pack-year history of smoking.  A recent low-dose screening CT showed a 1.5 cm right upper lobe nodule.  On PET CT the nodule was hypermetabolic.  There is no evidence of regional or distant metastatic disease.  I discussed the differential diagnosis of the nodule with Stacie Kennedy.  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  It has to be considered that unless it can be proven otherwise.  Infectious and inflammatory nodules are in the differential diagnosis but are far less likely.  Carcinoid tumor is also within the differential.  I recommended that we proceed with surgical resection.  We would do a wedge resection and then base the decision on whether to do a lobectomy on the intraoperative frozen section findings.  I do not think a bronchoscopic biopsy or needle biopsy would change our thinking given the high index of suspicion regarding the nodule.  I recommended we proceed with robotic right VATS for wedge resection and possible right upper lobectomy.  I described the general nature of the procedure to her including the  need for general anesthesia, the incisions to be used, the use of a drainage tube postoperatively, the expected hospital stay, and the overall recovery.  I informed her the indications, risks, benefits, and alternatives.  She understands the risks include, but not limited to death, MI, DVT, PE, bleeding, possible need for transfusion, infection, prolonged air leak, cardiac arrhythmias, as well as possibility of other unforeseeable complications.  She accepts the risk and agrees to proceed.  She would like to wait until the first of the year after the holidays, I encouraged her to go ahead and proceed with this in early December.   She does need pulmonary function  testing.  Tobacco abuse-has cut down to about 1/2 pack/day.  The importance of tobacco cessation was emphasized.  Currently being treated for cystitis by Dr. Matilde Sprang.  She has a follow-up with them next week  Plan: Pulmonary function tests Robotic right VATS for wedge resection and possible right upper lobectomy on Friday, 09/19/2020  Melrose Nakayama, MD [facility] [phone]

## 2020-08-28 NOTE — Progress Notes (Signed)
PCP is Default, Provider, MD Referring Provider is Verlon Au, NP  Chief Complaint  Patient presents with  . Lung Mass    new patient consultation, PET 08/20/20, Chest CT 08/13/20    HPI: Ms. Minella is sent for consultation regarding a right upper lobe lung nodule  Breane Grunwald is a 59 year old woman with a history of hypertension, hyperlipidemia, prediabetes, and tobacco abuse.  She has smoked up to 1 pack/day for 43 years.  She recently had a low-dose CT for lung cancer screening.  That showed a 1.5 cm right upper lobe nodule.  A PET/CT showed the nodule was hypermetabolic with an SUV of 7.9.  There was no evidence of regional or distant metastatic disease.  She did have an incidentally noted adrenal adenoma that had been present on a prior abdominal CT.  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  She is not having any chest pain, pressure, or tightness.  She denies shortness of breath except with heavy exertion.  She has not had any change in appetite or weight loss.  No unusual headaches or visual changes.  She has had a recent urinary tract infection.  Zubrod Score: At the time of surgery this patient's most appropriate activity status/level should be described as: [x]     0    Normal activity, no symptoms []     1    Restricted in physical strenuous activity but ambulatory, able to do out light work []     2    Ambulatory and capable of self care, unable to do work activities, up and about >50 % of waking hours                              []     3    Only limited self care, in bed greater than 50% of waking hours []     4    Completely disabled, no self care, confined to bed or chair []     5    Moribund  Past Medical History:  Diagnosis Date  . High cholesterol   . Hypertension   . Pre-diabetes     History reviewed. No pertinent surgical history.  History reviewed. No pertinent family history.  Social History Social History   Tobacco Use  . Smoking status:  Current Every Day Smoker    Packs/day: 1.00    Years: 43.00    Pack years: 43.00    Types: Cigarettes  . Smokeless tobacco: Never Used  Vaping Use  . Vaping Use: Never used  Substance Use Topics  . Alcohol use: Yes    Alcohol/week: 12.0 standard drinks    Types: 12 Cans of beer per week  . Drug use: No    Current Outpatient Medications  Medication Sig Dispense Refill  . metroNIDAZOLE (FLAGYL) 500 MG tablet Take 1 tablet (500 mg total) by mouth 2 (two) times daily. 14 tablet 0  . naproxen sodium (ALEVE) 220 MG tablet Take 220 mg by mouth daily as needed.     No current facility-administered medications for this visit.    No Known Allergies  Review of Systems  Constitutional: Negative for activity change, fatigue and unexpected weight change.  HENT: Positive for dental problem (Dentures). Negative for trouble swallowing and voice change.   Eyes: Negative for visual disturbance.  Respiratory: Positive for cough (Occasional). Negative for shortness of breath and wheezing (Rare).   Cardiovascular: Negative  for chest pain and leg swelling.  Gastrointestinal: Negative for abdominal distention and abdominal pain.  Genitourinary: Positive for dysuria.  Musculoskeletal: Positive for arthralgias (Right knee).       Leg cramps  Neurological: Negative for seizures, syncope and weakness.  Hematological: Negative for adenopathy. Does not bruise/bleed easily.    BP 114/75 (BP Location: Left Arm, Patient Position: Sitting, Cuff Size: Normal)   Pulse 82   Temp 98.1 F (36.7 C) (Skin)   Resp 20   Ht 5' 7.5" (1.715 m)   Wt 160 lb (72.6 kg)   SpO2 95% Comment: RA  BMI 24.69 kg/m  Physical Exam Vitals reviewed.  Constitutional:      General: She is not in acute distress.    Appearance: Normal appearance.  HENT:     Head: Normocephalic and atraumatic.  Eyes:     General: No scleral icterus.    Extraocular Movements: Extraocular movements intact.  Cardiovascular:     Rate and  Rhythm: Normal rate and regular rhythm.     Heart sounds: Normal heart sounds. No murmur heard.  No friction rub. No gallop.   Pulmonary:     Effort: Pulmonary effort is normal. No respiratory distress.     Breath sounds: Normal breath sounds. No wheezing or rales.  Abdominal:     General: There is no distension.     Palpations: Abdomen is soft.  Musculoskeletal:        General: No swelling.     Cervical back: Neck supple.  Lymphadenopathy:     Cervical: No cervical adenopathy.  Skin:    General: Skin is warm and dry.  Neurological:     General: No focal deficit present.     Mental Status: She is alert and oriented to person, place, and time.     Cranial Nerves: No cranial nerve deficit.     Motor: No weakness.     Gait: Gait normal.    Diagnostic Tests: CT CHEST WITHOUT CONTRAST LOW-DOSE FOR LUNG CANCER SCREENING  TECHNIQUE: Multidetector CT imaging of the chest was performed following the standard protocol without IV contrast.  COMPARISON:  None.  FINDINGS: Cardiovascular: The heart size is normal. No substantial pericardial effusion. Coronary artery calcification is evident. Atherosclerotic calcification is noted in the wall of the thoracic aorta.  Mediastinum/Nodes: No mediastinal lymphadenopathy. No evidence for gross hilar lymphadenopathy although assessment is limited by the lack of intravenous contrast on today's study. The esophagus has normal imaging features. There is no axillary lymphadenopathy.  Lungs/Pleura: Centrilobular emphsyema noted. 15.6 mm highly suspicious lobulated peripheral right upper lobe pulmonary nodule identified on image 92. Atelectasis or scarring noted medial right middle lobe. No other suspicious pulmonary nodule or mass. No focal airspace consolidation. No pleural effusion.  Upper Abdomen: 2.8 cm left adrenal nodule is stable since CT abdomen 04/10/2018 and measures low-density today consistent with adenoma. Epigastric  midline ventral hernia contains only fat with hernia sac measuring approximately 5.4 x 3.4 cm on axial imaging.  Musculoskeletal: No worrisome lytic or sclerotic osseous abnormality.  IMPRESSION: 1. Lung-RADS 4B, suspicious. Additional imaging evaluation or consultation with Pulmonology or Thoracic Surgery recommended. 2. Stable left adrenal adenoma. 3. Aortic Atherosclerosis (ICD10-I70.0) and Emphysema (ICD10-J43.9).  These results will be called to the ordering clinician or representative by the Radiologist Assistant, and communication documented in the PACS or [employer].   Electronically Signed   By: Misty Stanley M.D.   On: 08/13/2020 13:11 NUCLEAR MEDICINE PET SKULL BASE TO THIGH  TECHNIQUE: 8.62 mCi F-18 FDG was injected intravenously. Full-ring PET imaging was performed from the skull base to thigh after the radiotracer. CT data was obtained and used for attenuation correction and anatomic localization.  Fasting blood glucose: 96 mg/dl  COMPARISON:  Chest CT 08/13/2020  FINDINGS: Mediastinal blood pool activity: SUV max 2.50  Liver activity: SUV max NA  NECK: No hypermetabolic lymph nodes in the neck.  Incidental CT findings: none  CHEST: 18 mm peripheral right upper lobe pulmonary nodule is hypermetabolic with SUV max of 8.52.  No enlarged or hypermetabolic mediastinal or hilar lymph nodes.  No other pulmonary lesions are identified.  No breast masses, supraclavicular or axillary adenopathy.  Incidental CT findings: Underlying emphysematous changes.  Age advanced three-vessel coronary artery calcifications and scattered aortic calcifications.  ABDOMEN/PELVIS: No abnormal hypermetabolic activity within the liver, pancreas, adrenal glands, or spleen. No hypermetabolic lymph nodes in the abdomen or pelvis.  Incidental CT findings: Age advanced atherosclerotic calcifications involving the abdominal aorta and iliac arteries but  no aneurysm.  Upper abdominal ventral wall hernia containing fat. There is also a small periumbilical abdominal wall hernia containing fat.  Calcified uterine fibroids are noted.  SKELETON: No focal hypermetabolic activity to suggest skeletal metastasis.  Incidental CT findings: none  IMPRESSION: 1. 18 mm right upper lobe pulmonary nodule is hypermetabolic and consistent with primary lung neoplasm. 2. No findings for metastatic adenopathy or metastatic disease elsewhere. 3. Emphysematous changes and age advanced atherosclerotic disease.   Electronically Signed   By: Marijo Sanes M.D.   On: 08/21/2020 05:21 I personally reviewed the CT and PET/CT images and concur with the findings noted above  Impression: Margrett Kalb is a 59 year old woman with a history of hypertension, hyperlipidemia, prediabetes, and tobacco abuse.  She has about a 40-pack-year history of smoking.  A recent low-dose screening CT showed a 1.5 cm right upper lobe nodule.  On PET CT the nodule was hypermetabolic.  There is no evidence of regional or distant metastatic disease.  I discussed the differential diagnosis of the nodule with Ms. Pauli.  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  It has to be considered that unless it can be proven otherwise.  Infectious and inflammatory nodules are in the differential diagnosis but are far less likely.  Carcinoid tumor is also within the differential.  I recommended that we proceed with surgical resection.  We would do a wedge resection and then base the decision on whether to do a lobectomy on the intraoperative frozen section findings.  I do not think a bronchoscopic biopsy or needle biopsy would change our thinking given the high index of suspicion regarding the nodule.  I recommended we proceed with robotic right VATS for wedge resection and possible right upper lobectomy.  I described the general nature of the procedure to her including the  need for general anesthesia, the incisions to be used, the use of a drainage tube postoperatively, the expected hospital stay, and the overall recovery.  I informed her the indications, risks, benefits, and alternatives.  She understands the risks include, but not limited to death, MI, DVT, PE, bleeding, possible need for transfusion, infection, prolonged air leak, cardiac arrhythmias, as well as possibility of other unforeseeable complications.  She accepts the risk and agrees to proceed.  She would like to wait until the first of the year after the holidays, I encouraged her to go ahead and proceed with this in early December.   She does need pulmonary function  testing.  Tobacco abuse-has cut down to about 1/2 pack/day.  The importance of tobacco cessation was emphasized.  Currently being treated for cystitis by Dr. Matilde Sprang.  She has a follow-up with them next week  Plan: Pulmonary function tests Robotic right VATS for wedge resection and possible right upper lobectomy on Friday, 09/19/2020  Melrose Nakayama, MD [facility] [phone]

## 2020-08-29 ENCOUNTER — Encounter: Payer: Self-pay | Admitting: *Deleted

## 2020-09-08 ENCOUNTER — Telehealth: Payer: Self-pay

## 2020-09-08 NOTE — Telephone Encounter (Signed)
Patient called needing clarification on trichomonas diagnosis from last visit and which test her partner should get tested for. She states she did complete her course of Flagyl and does understand that until he is tested and treated that she should abstain from intercourse or she will need to be retested because she could become infected again. Patient verbalized understanding.

## 2020-09-11 ENCOUNTER — Encounter: Payer: Self-pay | Admitting: Gerontology

## 2020-09-11 ENCOUNTER — Other Ambulatory Visit: Payer: Self-pay

## 2020-09-11 ENCOUNTER — Other Ambulatory Visit: Payer: Self-pay | Admitting: Gerontology

## 2020-09-11 ENCOUNTER — Ambulatory Visit: Payer: Self-pay | Admitting: Gerontology

## 2020-09-11 VITALS — BP 114/75 | HR 73 | Ht 67.5 in | Wt 160.9 lb

## 2020-09-11 DIAGNOSIS — R7303 Prediabetes: Secondary | ICD-10-CM

## 2020-09-11 DIAGNOSIS — R399 Unspecified symptoms and signs involving the genitourinary system: Secondary | ICD-10-CM

## 2020-09-11 DIAGNOSIS — F17200 Nicotine dependence, unspecified, uncomplicated: Secondary | ICD-10-CM

## 2020-09-11 DIAGNOSIS — E785 Hyperlipidemia, unspecified: Secondary | ICD-10-CM

## 2020-09-11 MED ORDER — ROSUVASTATIN CALCIUM 5 MG PO TABS: 5.0000 mg | ORAL_TABLET | Freq: Every day | ORAL | 0 refills | Status: DC

## 2020-09-11 NOTE — Progress Notes (Signed)
Established Patient Office Visit  Subjective:  Patient ID: Stacie Kennedy, female    DOB: [birth year]  Age: 59 y.o. MRN: [id number]  CC:  Chief Complaint  Patient presents with  . Follow-up    HPI Stacie Kennedy is a 59 y.o. who presents for follow up of Prediabetes, hyperlipidemia, and lab review. She verbalized not taking her medications because it made her really sleepy and tired.She continues to smoke 1/2 packet of cigarette per day and endorsed no intention on quitting. Her HgbA1C done 08/27/2020 increased from 6.0 to 6.2%. Total cholesterol was 220 mg/dL, Triglycerides 170 mg/dL, HDL 38 mg/dL, LDL 151 mg/dL. Her urinalysis showed +2, RBC, +1 leukocytes, and trace protein. Her urine culture was positive for E.Coli, and Trichomonas.She was seen on 08/25/2020 by Dr. Bjorn Loser for recurrent UTI symptoms. Currently, she denies urinary frequency, urgency, hematuria, or dysuria. She was seen by Dr. Roxan Hockey  on 08/28/2020 for consultation regarding a right upper lobe lung nodule. Per Dr. 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 She is schedule for surgery on 09/19/2020. She denies fever, chest pain,or dyspnea on exertion. She states that her mood is good. Overall, she states that she is doing well considering everything going on and offers no further complaint.        Past Medical History:  Diagnosis Date  . High cholesterol   . Hypertension   . Pre-diabetes     History reviewed. No pertinent surgical history.  History reviewed. No pertinent family history.  Social History   Socioeconomic History  . Marital status: Single    Spouse name: Not on file  . Number of children: Not on file  . Years of education: Not on file  . Highest education level: Not on file  Occupational History  . Not on file  Tobacco Use  . Smoking status: Current Every Day Smoker    Packs/day: 0.50    Years: 43.00     Pack years: 21.50    Types: Cigarettes  . Smokeless tobacco: Never Used  Vaping Use  . Vaping Use: Never used  Substance and Sexual Activity  . Alcohol use: Yes    Alcohol/week: 12.0 standard drinks    Types: 12 Cans of beer per week  . Drug use: No  . Sexual activity: Not on file  Other Topics Concern  . Not on file  Social History Narrative  . Not on file   Social Determinants of Health   Financial Resource Strain: High Risk  . Difficulty of Paying Living Expenses: Hard  Food Insecurity: Food Insecurity Present  . Worried About Charity fundraiser in the Last Year: Sometimes true  . Ran Out of Food in the Last Year: Sometimes true  Transportation Needs: No Transportation Needs  . Lack of Transportation (Medical): No  . Lack of Transportation (Non-Medical): No  Physical Activity: Sufficiently Active  . Days of Exercise per Week: 5 days  . Minutes of Exercise per Session: 60 min  Stress: Stress Concern Present  . Feeling of Stress : Very much  Social Connections: Socially Isolated  . Frequency of Communication with Friends and Family: Never  . Frequency of Social Gatherings with Friends and Family: Never  . Attends Religious Services: Never  . Active Member of Clubs or Organizations: No  . Attends Archivist Meetings: Never  . Marital Status: Never married  Intimate Partner Violence: Not At Risk  . Fear  of Current or Ex-Partner: No  . Emotionally Abused: No  . Physically Abused: No  . Sexually Abused: No    Outpatient Medications Prior to Visit  Medication Sig Dispense Refill  . naproxen sodium (ALEVE) 220 MG tablet Take 220 mg by mouth daily as needed.    . metroNIDAZOLE (FLAGYL) 500 MG tablet Take 1 tablet (500 mg total) by mouth 2 (two) times daily. (Patient not taking: Reported on 09/11/2020) 14 tablet 0   No facility-administered medications prior to visit.    No Known Allergies  ROS Review of Systems  Constitutional: Negative.   HENT: Negative.    Eyes: Negative.   Respiratory: Negative.   Cardiovascular: Negative.   Gastrointestinal: Negative.   Endocrine: Negative.   Genitourinary: Negative.   Musculoskeletal: Negative.   Neurological: Negative.   Hematological: Negative.   Psychiatric/Behavioral: Negative.       Objective:    Physical Exam HENT:     Head: Normocephalic and atraumatic.     Nose: Nose normal.  Cardiovascular:     Rate and Rhythm: Normal rate and regular rhythm.     Pulses: Normal pulses.     Heart sounds: Normal heart sounds.  Pulmonary:     Effort: Pulmonary effort is normal.     Breath sounds: Normal breath sounds.  Abdominal:     General: Abdomen is flat.  Musculoskeletal:        General: Normal range of motion.  Neurological:     General: No focal deficit present.     Mental Status: She is alert.  Psychiatric:        Mood and Affect: Mood normal.        Behavior: Behavior normal.        Thought Content: Thought content normal.        Judgment: Judgment normal.     BP 114/75 (BP Location: Left Arm, Patient Position: Sitting, Cuff Size: Normal)   Pulse 73   Ht 5' 7.5" (1.715 m)   Wt 160 lb 14.4 oz (73 kg)   SpO2 96%   BMI 24.83 kg/m  Wt Readings from Last 3 Encounters:  09/11/20 160 lb 14.4 oz (73 kg)  08/28/20 160 lb (72.6 kg)  08/25/20 163 lb (73.9 kg)     Health Maintenance Due  Topic Date Due  . Hepatitis C Screening  Never done  . HIV Screening  Never done  . TETANUS/TDAP  Never done  . PAP SMEAR-Modifier  Never done  . MAMMOGRAM  Never done  . COLONOSCOPY  Never done  . INFLUENZA VACCINE  Never done    There are no preventive care reminders to display for this patient.  No results found for: TSH Lab Results  Component Value Date   WBC 11.2 (H) 07/27/2020   HGB 16.4 (H) 07/27/2020   HCT 48.1 (H) 07/27/2020   MCV 86.8 07/27/2020   PLT 308 07/27/2020   Lab Results  Component Value Date   NA 135 07/27/2020   K 3.7 07/27/2020   CO2 24 07/27/2020   GLUCOSE  116 (H) 07/27/2020   BUN 10 07/27/2020   CREATININE 0.64 07/27/2020   BILITOT 0.8 07/27/2020   ALKPHOS 90 07/27/2020   AST 15 07/27/2020   ALT 9 07/27/2020   PROT 7.9 07/27/2020   ALBUMIN 4.2 07/27/2020   CALCIUM 9.5 07/27/2020   ANIONGAP 11 07/27/2020   Lab Results  Component Value Date   CHOL 220 (H) 08/27/2020   Lab Results  Component Value Date  HDL 38 (L) 08/27/2020   Lab Results  Component Value Date   LDLCALC 151 (H) 08/27/2020   Lab Results  Component Value Date   TRIG 170 (H) 08/27/2020   Lab Results  Component Value Date   CHOLHDL 5.8 (H) 08/27/2020   Lab Results  Component Value Date   HGBA1C 6.2 (H) 08/27/2020           Assessment & Plan:   1. Elevated lipids Her 10 year ASCVD risk was 9.1%. She will be started on Crestor. She was educated on the side effects of medication and was informed to contact clinic. - rosuvastatin (CRESTOR) 5 MG tablet; Take 1 tablet (5 mg total) by mouth daily.  Dispense: 30 tablet; Refill: 0 She was advised to take medication as prescribed She was advised to increase her water intake  She was encouraged to continue low fat/cholesterol diet and exercise as tolerated  2. Prediabetes Her HgbA1C was 6.2%. She was educated and advised to continue positive lifestyle. She declined Metformin. Continue low fat/concenetrated sweet diet and exercise as tolerated   3. Smoking She was strongly advised to quit smoking and was provided with information on smoking cessation  4. Urinary tract infection symptoms She states that her UTI symptoms has resolved and denies urinary urgency, frequency, dysuria, or hematuria. She was encouraged to increase her water intake She was advised to perform adequate perineal care and change her underwear as needed. She will continue to follow up with the urologist    Follow-up: Return in about 5 weeks (around 10/16/2020), or if symptoms worsen or fail to improve.    Carney Corners, RN

## 2020-09-11 NOTE — Patient Instructions (Addendum)
Fat and Cholesterol Restricted Eating Plan Getting too much fat and cholesterol in your diet may cause health problems. Choosing the right foods helps keep your fat and cholesterol at normal levels. This can keep you from getting certain diseases. Your doctor may recommend an eating plan that includes:  Total fat: ______% or less of total calories a day.  Saturated fat: ______% or less of total calories a day.  Cholesterol: less than _________mg a day.  Fiber: ______g a day. What are tips for following this plan? Meal planning  At meals, divide your plate into four equal parts: ? Fill one-half of your plate with vegetables and green salads. ? Fill one-fourth of your plate with whole grains. ? Fill one-fourth of your plate with low-fat (lean) protein foods.  Eat fish that is high in omega-3 fats at least two times a week. This includes mackerel, tuna, sardines, and salmon.  Eat foods that are high in fiber, such as whole grains, beans, apples, broccoli, carrots, peas, and barley. General tips   Work with your doctor to lose weight if you need to.  Avoid: ? Foods with added sugar. ? Fried foods. ? Foods with partially hydrogenated oils.  Limit alcohol intake to no more than 1 drink a day for nonpregnant women and 2 drinks a day for men. One drink equals 12 oz of beer, 5 oz of wine, or 1 oz of hard liquor. Reading food labels  Check food labels for: ? Trans fats. ? Partially hydrogenated oils. ? Saturated fat (g) in each serving. ? Cholesterol (mg) in each serving. ? Fiber (g) in each serving.  Choose foods with healthy fats, such as: ? Monounsaturated fats. ? Polyunsaturated fats. ? Omega-3 fats.  Choose grain products that have whole grains. Look for the word "whole" as the first word in the ingredient list. Cooking  Cook foods using low-fat methods. These include baking, boiling, grilling, and broiling.  Eat more home-cooked foods. Eat at restaurants and buffets  less often.  Avoid cooking using saturated fats, such as butter, cream, palm oil, palm kernel oil, and coconut oil. Recommended foods  Fruits  All fresh, canned (in natural juice), or frozen fruits. Vegetables  Fresh or frozen vegetables (raw, steamed, roasted, or grilled). Green salads. Grains  Whole grains, such as whole wheat or whole grain breads, crackers, cereals, and pasta. Unsweetened oatmeal, bulgur, barley, quinoa, or brown rice. Corn or whole wheat flour tortillas. Meats and other protein foods  Ground beef (85% or leaner), grass-fed beef, or beef trimmed of fat. Skinless chicken or [place]. Ground chicken or [place]. Pork trimmed of fat. All fish and seafood. Egg whites. Dried beans, peas, or lentils. Unsalted nuts or seeds. Unsalted canned beans. Nut butters without added sugar or oil. Dairy  Low-fat or nonfat dairy products, such as skim or 1% milk, 2% or reduced-fat cheeses, low-fat and fat-free ricotta or cottage cheese, or plain low-fat and nonfat yogurt. Fats and oils  Tub margarine without trans fats. Light or reduced-fat mayonnaise and salad dressings. Avocado. Olive, canola, sesame, or safflower oils. The items listed above may not be a complete list of foods and beverages you can eat. Contact a dietitian for more information. Foods to avoid Fruits  Canned fruit in heavy syrup. Fruit in cream or butter sauce. Fried fruit. Vegetables  Vegetables cooked in cheese, cream, or butter sauce. Fried vegetables. Grains  White bread. White pasta. White rice. Cornbread. Bagels, pastries, and croissants. Crackers and snack foods that contain trans fat  and hydrogenated oils. Meats and other protein foods  Fatty cuts of meat. Ribs, chicken wings, bacon, sausage, bologna, salami, chitterlings, fatback, hot dogs, bratwurst, and packaged lunch meats. Liver and organ meats. Whole eggs and egg yolks. Chicken and [place] with skin. Fried meat. Dairy  Whole or 2% milk, cream,  half-and-half, and cream cheese. Whole milk cheeses. Whole-fat or sweetened yogurt. Full-fat cheeses. Nondairy creamers and whipped toppings. Processed cheese, cheese spreads, and cheese curds. Beverages  Alcohol. Sugar-sweetened drinks such as sodas, lemonade, and fruit drinks. Fats and oils  Butter, stick margarine, lard, shortening, ghee, or bacon fat. Coconut, palm kernel, and palm oils. Sweets and desserts  Corn syrup, sugars, honey, and molasses. Candy. Jam and jelly. Syrup. Sweetened cereals. Cookies, pies, cakes, donuts, muffins, and ice cream. The items listed above may not be a complete list of foods and beverages you should avoid. Contact a dietitian for more information. Summary  Choosing the right foods helps keep your fat and cholesterol at normal levels. This can keep you from getting certain diseases.  At meals, fill one-half of your plate with vegetables and green salads.  Eat high-fiber foods, like whole grains, beans, apples, carrots, peas, and barley.  Limit added sugar, saturated fats, alcohol, and fried foods. This information is not intended to replace advice given to you by your health care provider. Make sure you discuss any questions you have with your health care provider. Document Revised: 05/31/2018 Document Reviewed: 06/14/2017 Elsevier Patient Education  2020 [employer].  Prediabetes Eating Plan Prediabetes is a condition that causes blood sugar (glucose) levels to be higher than normal. This increases the risk for developing diabetes. In order to prevent diabetes from developing, your health care provider may recommend a diet and other lifestyle changes to help you:  Control your blood glucose levels.  Improve your cholesterol levels.  Manage your blood pressure. Your health care provider may recommend working with a diet and nutrition specialist (dietitian) to make a meal plan that is best for you. What are tips for following this  plan? Lifestyle  Set weight loss goals with the help of your health care team. It is recommended that most people with prediabetes lose 7% of their current body weight.  Exercise for at least 30 minutes at least 5 days a week.  Attend a support group or seek ongoing support from a mental health counselor.  Take over-the-counter and prescription medicines only as told by your health care provider. Reading food labels  Read food labels to check the amount of fat, salt (sodium), and sugar in prepackaged foods. Avoid foods that have: ? Saturated fats. ? Trans fats. ? Added sugars.  Avoid foods that have more than 300 milligrams (mg) of sodium per serving. Limit your daily sodium intake to less than 2,300 mg each day. Shopping  Avoid buying pre-made and processed foods. Cooking  Cook with olive oil. Do not use butter, lard, or ghee.  Bake, broil, grill, or boil foods. Avoid frying. Meal planning   Work with your dietitian to develop an eating plan that is right for you. This may include: ? Tracking how many calories you take in. Use a food diary, notebook, or mobile application to track what you eat at each meal. ? Using the glycemic index (GI) to plan your meals. The index tells you how quickly a food will raise your blood glucose. Choose low-GI foods. These foods take a longer time to raise blood glucose.  Consider following a Mediterranean diet.  This diet includes: ? Several servings each day of fresh fruits and vegetables. ? Eating fish at least twice a week. ? Several servings each day of whole grains, beans, nuts, and seeds. ? Using olive oil instead of other fats. ? Moderate alcohol consumption. ? Eating small amounts of red meat and whole-fat dairy.  If you have high blood pressure, you may need to limit your sodium intake or follow a diet such as the DASH eating plan. DASH is an eating plan that aims to lower high blood pressure. What foods are recommended? The items  listed below may not be a complete list. Talk with your dietitian about what dietary choices are best for you. Grains Whole grains, such as whole-wheat or whole-grain breads, crackers, cereals, and pasta. Unsweetened oatmeal. Bulgur. Barley. Quinoa. Brown rice. Corn or whole-wheat flour tortillas or taco shells. Vegetables Lettuce. Spinach. Peas. Beets. Cauliflower. Cabbage. Broccoli. Carrots. Tomatoes. Squash. Eggplant. Herbs. Peppers. Onions. Cucumbers. Brussels sprouts. Fruits Berries. Bananas. Apples. Oranges. Grapes. Papaya. Mango. Pomegranate. Kiwi. Grapefruit. Cherries. Meats and other protein foods Seafood. Poultry without skin. Lean cuts of pork and beef. Tofu. Eggs. Nuts. Beans. Dairy Low-fat or fat-free dairy products, such as yogurt, cottage cheese, and cheese. Beverages Water. Tea. Coffee. Sugar-free or diet soda. Seltzer water. Lowfat or no-fat milk. Milk alternatives, such as soy or almond milk. Fats and oils Olive oil. Canola oil. Sunflower oil. Grapeseed oil. Avocado. Walnuts. Sweets and desserts Sugar-free or low-fat pudding. Sugar-free or low-fat ice cream and other frozen treats. Seasoning and other foods Herbs. Sodium-free spices. Mustard. Relish. Low-fat, low-sugar ketchup. Low-fat, low-sugar barbecue sauce. Low-fat or fat-free mayonnaise. What foods are not recommended? The items listed below may not be a complete list. Talk with your dietitian about what dietary choices are best for you. Grains Refined white flour and flour products, such as bread, pasta, snack foods, and cereals. Vegetables Canned vegetables. Frozen vegetables with butter or cream sauce. Fruits Fruits canned with syrup. Meats and other protein foods Fatty cuts of meat. Poultry with skin. Breaded or fried meat. Processed meats. Dairy Full-fat yogurt, cheese, or milk. Beverages Sweetened drinks, such as sweet iced tea and soda. Fats and oils Butter. Lard. Ghee. Sweets and desserts Baked  goods, such as cake, cupcakes, pastries, cookies, and cheesecake. Seasoning and other foods Spice mixes with added salt. Ketchup. Barbecue sauce. Mayonnaise. Summary  To prevent diabetes from developing, you may need to make diet and other lifestyle changes to help control blood sugar, improve cholesterol levels, and manage your blood pressure.  Set weight loss goals with the help of your health care team. It is recommended that most people with prediabetes lose 7 percent of their current body weight.  Consider following a Mediterranean diet that includes plenty of fresh fruits and vegetables, whole grains, beans, nuts, seeds, fish, lean meat, low-fat dairy, and healthy oils. This information is not intended to replace advice given to you by your health care provider. Make sure you discuss any questions you have with your health care provider. Document Revised: 01/19/2019 Document Reviewed: 12/01/2016 Elsevier Patient Education  2020 [employer].

## 2020-09-15 ENCOUNTER — Other Ambulatory Visit: Payer: Self-pay

## 2020-09-15 ENCOUNTER — Ambulatory Visit (INDEPENDENT_AMBULATORY_CARE_PROVIDER_SITE_OTHER): Payer: Self-pay | Admitting: Urology

## 2020-09-15 VITALS — BP 139/57 | HR 71

## 2020-09-15 DIAGNOSIS — N3020 Other chronic cystitis without hematuria: Secondary | ICD-10-CM

## 2020-09-15 DIAGNOSIS — R310 Gross hematuria: Secondary | ICD-10-CM

## 2020-09-15 LAB — URINALYSIS, COMPLETE
Bilirubin, UA: NEGATIVE
Glucose, UA: NEGATIVE
Ketones, UA: NEGATIVE
Leukocytes,UA: NEGATIVE
Nitrite, UA: NEGATIVE
Protein,UA: NEGATIVE
Specific Gravity, UA: 1.025 (ref 1.005–1.030)
Urobilinogen, Ur: 0.2 mg/dL (ref 0.2–1.0)
pH, UA: 6 (ref 5.0–7.5)

## 2020-09-15 LAB — MICROSCOPIC EXAMINATION: Bacteria, UA: NONE SEEN

## 2020-09-15 NOTE — Progress Notes (Signed)
09/15/2020 1:27 PM   Stacie Kennedy 1961-09-01 [id number]  Referring provider: No referring provider defined for this encounter.  No chief complaint on file.   HPI: The patient's history was a bit difficult but appears she recently had a bladder infection with discomfort hematuria foul-smelling urine and frequency that responded to an antibiotic.  She thinks she gets about one infection a year and thinks her symptoms may have normalized but has not sure she still infected.  She voids every 3 hours and gets up once a night.  When she is not infected she intermittently has urge incontinence not wearing a pad.  No bedwetting.  No stress incontinence.  He has smoked  She had a recent CT scan of the abdomen pelvis with contrast and normal kidneys.  A few months ago she had a positive culture in the medical record  I think is best for safety reasons because of smoking history to perform cystoscopy next visit.  Otherwise I think she'll be happy with reassurance with a normal work-up for infections or blood.  The patient had a blood cells trichomonas and bacteria in the urine.  I gave her Flagyl for a week and she will come back  for cystoscopy.  Call if urine culture is otherwise positive.  I wrote down trichomonas for her and she will speak to her asymptomatic single partner.  She understands that the trichomonas could be present in 1 or both of them for many years  Today Frequency is stable.  Urine culture was positive.  Clinically not infected.  I could not be definite whether or not the patient was treated with a second antibiotic but she is clinically not infected today.  No blood  Cystoscopy: Patient underwent flexible cystoscopy.  Bladder mucosa and trigone were normal.  No stitch foreign body or carcinoma.  No cystitis.   PMH: Past Medical History:  Diagnosis Date  . High cholesterol   . Hypertension   . Pre-diabetes     Surgical History: No past surgical history on  file.  Home Medications:  Allergies as of 09/15/2020   No Known Allergies     Medication List       Accurate as of September 15, 2020  1:27 PM. If you have any questions, ask your nurse or doctor.        rosuvastatin 5 MG tablet Commonly known as: CRESTOR Take 1 tablet (5 mg total) by mouth daily.       Allergies: No Known Allergies  Family History: No family history on file.  Social History:  reports that she has been smoking cigarettes. She has a 21.50 pack-year smoking history. She has never used smokeless tobacco. She reports current alcohol use of about 12.0 standard drinks of alcohol per week. She reports that she does not use drugs.  ROS:                                        Physical Exam: There were no vitals taken for this visit.  Constitutional:  Alert and oriented, No acute distress.  Laboratory Data: Lab Results  Component Value Date   WBC 11.2 (H) 07/27/2020   HGB 16.4 (H) 07/27/2020   HCT 48.1 (H) 07/27/2020   MCV 86.8 07/27/2020   PLT 308 07/27/2020    Lab Results  Component Value Date   CREATININE 0.64 07/27/2020    No results found  for: PSA  No results found for: TESTOSTERONE  Lab Results  Component Value Date   HGBA1C 6.2 (H) 08/27/2020    Urinalysis    Component Value Date/Time   COLORURINE YELLOW (A) 08/08/2019 1401   APPEARANCEUR Cloudy (A) 08/25/2020 1329   LABSPEC 1.012 08/08/2019 1401   PHURINE 5.0 08/08/2019 1401   GLUCOSEU Negative 08/25/2020 1329   HGBUR MODERATE (A) 08/08/2019 1401   BILIRUBINUR Negative 08/25/2020 1329   KETONESUR NEGATIVE 08/08/2019 1401   PROTEINUR Trace (A) 08/25/2020 1329   PROTEINUR 30 (A) 08/08/2019 1401   NITRITE Negative 08/25/2020 1329   NITRITE POSITIVE (A) 08/08/2019 1401   LEUKOCYTESUR 1+ (A) 08/25/2020 1329   LEUKOCYTESUR LARGE (A) 08/08/2019 1401    Pertinent Imaging:   Assessment & Plan: Patient has been cleared for blood in urine and infections.  See as  needed.  Call if culture is positive  1. Gross hematuria  - Urinalysis, Complete   No follow-ups on file.  Reece Packer, MD  [facility] [address], [place], [place] [address] [phone]

## 2020-09-16 ENCOUNTER — Other Ambulatory Visit
Admission: RE | Admit: 2020-09-16 | Discharge: 2020-09-16 | Disposition: A | Discharge status: Home / Self Care | Payer: HRSA Program | Source: Ambulatory Visit | Attending: Thoracic Surgery (Cardiothoracic Vascular Surgery) | Admitting: Thoracic Surgery (Cardiothoracic Vascular Surgery)

## 2020-09-16 DIAGNOSIS — Z01812 Encounter for preprocedural laboratory examination: Secondary | ICD-10-CM | POA: Insufficient documentation

## 2020-09-16 DIAGNOSIS — Z20822 Contact with and (suspected) exposure to covid-19: Secondary | ICD-10-CM | POA: Insufficient documentation

## 2020-09-17 ENCOUNTER — Other Ambulatory Visit: Payer: Self-pay

## 2020-09-17 ENCOUNTER — Ambulatory Visit (HOSPITAL_COMMUNITY)
Admission: RE | Admit: 2020-09-17 | Discharge: 2020-09-17 | Disposition: A | Discharge status: Home / Self Care | Payer: Self-pay | Source: Ambulatory Visit | Attending: Thoracic Surgery (Cardiothoracic Vascular Surgery) | Admitting: Thoracic Surgery (Cardiothoracic Vascular Surgery)

## 2020-09-17 ENCOUNTER — Encounter (HOSPITAL_COMMUNITY): Payer: Self-pay

## 2020-09-17 ENCOUNTER — Encounter (HOSPITAL_COMMUNITY)
Admission: RE | Admit: 2020-09-17 | Discharge: 2020-09-17 | Disposition: A | Discharge status: Home / Self Care | Payer: Self-pay | Source: Ambulatory Visit | Attending: Thoracic Surgery (Cardiothoracic Vascular Surgery) | Admitting: Thoracic Surgery (Cardiothoracic Vascular Surgery)

## 2020-09-17 DIAGNOSIS — R911 Solitary pulmonary nodule: Secondary | ICD-10-CM

## 2020-09-17 HISTORY — DX: Malignant (primary) neoplasm, unspecified: C80.1

## 2020-09-17 HISTORY — DX: Umbilical hernia without obstruction or gangrene: K42.9

## 2020-09-17 LAB — URINALYSIS, ROUTINE W REFLEX MICROSCOPIC
Bilirubin Urine: NEGATIVE
Glucose, UA: NEGATIVE mg/dL
Hgb urine dipstick: NEGATIVE
Ketones, ur: NEGATIVE mg/dL
Leukocytes,Ua: NEGATIVE
Nitrite: NEGATIVE
Protein, ur: NEGATIVE mg/dL
Specific Gravity, Urine: 1.027 (ref 1.005–1.030)
pH: 5 (ref 5.0–8.0)

## 2020-09-17 LAB — COMPREHENSIVE METABOLIC PANEL
ALT: 13 U/L (ref 0–44)
AST: 17 U/L (ref 15–41)
Albumin: 3.7 g/dL (ref 3.5–5.0)
Alkaline Phosphatase: 93 U/L (ref 38–126)
Anion gap: 11 (ref 5–15)
BUN: 14 mg/dL (ref 6–20)
CO2: 19 mmol/L — ABNORMAL LOW (ref 22–32)
Calcium: 9.5 mg/dL (ref 8.9–10.3)
Chloride: 108 mmol/L (ref 98–111)
Creatinine, Ser: 0.67 mg/dL (ref 0.44–1.00)
GFR, Estimated: >60 mL/min (ref >60)
Glucose, Bld: 95 mg/dL (ref 70–99)
Potassium: 4.2 mmol/L (ref 3.5–5.1)
Sodium: 138 mmol/L (ref 135–145)
Total Bilirubin: 0.4 mg/dL (ref 0.3–1.2)
Total Protein: 7 g/dL (ref 6.5–8.1)

## 2020-09-17 LAB — SURGICAL PCR SCREEN
MRSA, PCR: NEGATIVE
Staphylococcus aureus: NEGATIVE

## 2020-09-17 LAB — PULMONARY FUNCTION TEST
DL/VA % pred: 54 %
DL/VA: 2.24 ml/min/mmHg/L
DLCO unc % pred: 44 %
DLCO unc: 9.89 ml/min/mmHg
FEF 25-75 Post: 0.74 L/sec
FEF 25-75 Pre: 0.48 L/sec
FEF2575-%Change-Post: 54 %
FEF2575-%Pred-Post: 31 %
FEF2575-%Pred-Pre: 20 %
FEV1-%Change-Post: 15 %
FEV1-%Pred-Post: 58 %
FEV1-%Pred-Pre: 50 %
FEV1-Post: 1.39 L
FEV1-Pre: 1.2 L
FEV1FVC-%Change-Post: 2 %
FEV1FVC-%Pred-Pre: 67 %
FEV6-%Change-Post: 14 %
FEV6-%Pred-Post: 81 %
FEV6-%Pred-Pre: 70 %
FEV6-Post: 2.38 L
FEV6-Pre: 2.07 L
FEV6FVC-%Change-Post: 1 %
FEV6FVC-%Pred-Post: 96 %
FEV6FVC-%Pred-Pre: 95 %
FVC-%Change-Post: 13 %
FVC-%Pred-Post: 83 %
FVC-%Pred-Pre: 73 %
FVC-Post: 2.54 L
FVC-Pre: 2.24 L
Post FEV1/FVC ratio: 55 %
Post FEV6/FVC ratio: 94 %
Pre FEV1/FVC ratio: 54 %
Pre FEV6/FVC Ratio: 93 %
RV % pred: 201 %
RV: 4.27 L
TLC % pred: 117 %
TLC: 6.47 L

## 2020-09-17 LAB — PROTIME-INR
INR: 0.9 (ref 0.8–1.2)
Prothrombin Time: 12.1 seconds (ref 11.4–15.2)

## 2020-09-17 LAB — BLOOD GAS, ARTERIAL
Acid-base deficit: 2.7 mmol/L — ABNORMAL HIGH (ref 0.0–2.0)
Bicarbonate: 21.2 mmol/L (ref 20.0–28.0)
FIO2: 21
O2 Saturation: 96 %
Patient temperature: 37
pCO2 arterial: 33.9 mmHg (ref 32.0–48.0)
pH, Arterial: 7.413 (ref 7.350–7.450)
pO2, Arterial: 75.5 mmHg — ABNORMAL LOW (ref 83.0–108.0)

## 2020-09-17 LAB — GLUCOSE, CAPILLARY: Glucose-Capillary: 86 mg/dL (ref 70–99)

## 2020-09-17 LAB — CBC
HCT: 48.9 % — ABNORMAL HIGH (ref 36.0–46.0)
Hemoglobin: 15.8 g/dL — ABNORMAL HIGH (ref 12.0–15.0)
MCH: 29.2 pg (ref 26.0–34.0)
MCHC: 32.3 g/dL (ref 30.0–36.0)
MCV: 90.4 fL (ref 80.0–100.0)
Platelets: 323 10*3/uL (ref 150–400)
RBC: 5.41 MIL/uL — ABNORMAL HIGH (ref 3.87–5.11)
RDW: 14.9 % (ref 11.5–15.5)
WBC: 9.1 10*3/uL (ref 4.0–10.5)
nRBC: 0 % (ref 0.0–0.2)

## 2020-09-17 LAB — SARS CORONAVIRUS 2 (TAT 6-24 HRS): SARS Coronavirus 2: NEGATIVE

## 2020-09-17 LAB — APTT: aPTT: 29 seconds (ref 24–36)

## 2020-09-17 MED ORDER — ALBUTEROL SULFATE (2.5 MG/3ML) 0.083% IN NEBU
2.5000 mg | INHALATION_SOLUTION | Freq: Once | RESPIRATORY_TRACT | Status: AC
  Administered 2020-09-17: 2.5 mg via RESPIRATORY_TRACT

## 2020-09-17 NOTE — Pre-Procedure Instructions (Signed)
Stacie Kennedy  09/17/2020      [facility] [address]), [place] [address] [place] ([place]) [place] [address] Phone: [phone] Fax: [phone]  Medication Mgmt. [employer], [place] [address] [place] [address] Phone: [phone] Fax: [phone]    Your procedure is scheduled on Dec. 10  Report to [facility] Entrance A at 5:30  A.M.  Call this number if you have problems the morning of surgery:  [phone]   Remember:  Do not eat or drink after midnight.      Take these medicines the morning of surgery with A SIP OF WATER : crestor (rosuvastatin)     7 days prior to surgery STOP taking any Aspirin (unless otherwise instructed by your surgeon), Aleve, Naproxen, Ibuprofen, Motrin, Advil, Goody's, BC's, all herbal medications, fish oil, and all vitamins.    Do not wear jewelry, make-up or nail polish.  Do not wear lotions, powders, or perfumes, or deodorant.  Do not shave 48 hours prior to surgery.  Men may shave face and neck.  Do not bring valuables to the hospital.  [facility] is not responsible for any belongings or valuables.  Contacts, dentures or bridgework may not be worn into surgery.  Leave your suitcase in the car.  After surgery it may be brought to your room.  For patients admitted to the hospital, discharge time will be determined by your treatment team.  Patients discharged the day of surgery will not be allowed to drive home.    Special instructions:   [facility]- Preparing For Surgery  Before surgery, you can play an important role. Because skin is not sterile, your skin needs to be as free of germs as possible. You can reduce the number of germs on your skin by washing with CHG (chlorahexidine gluconate) Soap before surgery.  CHG is an antiseptic cleaner which kills germs and bonds with the skin to continue killing germs even after washing.     Oral Hygiene is also important to reduce your risk of infection.  Remember - BRUSH YOUR TEETH THE MORNING OF SURGERY WITH YOUR REGULAR TOOTHPASTE  Please do not use if you have an allergy to CHG or antibacterial soaps. If your skin becomes reddened/irritated stop using the CHG.  Do not shave (including legs and underarms) for at least 48 hours prior to first CHG shower. It is OK to shave your face.  Please follow these instructions carefully.   1. Shower the NIGHT BEFORE SURGERY and the MORNING OF SURGERY with CHG.   2. If you chose to wash your hair, wash your hair first as usual with your normal shampoo.  3. After you shampoo, rinse your hair and body thoroughly to remove the shampoo.  4. Use CHG as you would any other liquid soap. You can apply CHG directly to the skin and wash gently with a scrungie or a clean washcloth.   5. Apply the CHG Soap to your body ONLY FROM THE NECK DOWN.  Do not use on open wounds or open sores. Avoid contact with your eyes, ears, mouth and genitals (private parts). Wash Face and genitals (private parts)  with your normal soap.  6. Wash thoroughly, paying special attention to the area where your surgery will be performed.  7. Thoroughly rinse your body with warm water from the neck down.  8. DO NOT shower/wash with your normal soap after  using and rinsing off the CHG Soap.  9. Pat yourself dry with a CLEAN TOWEL.  10. Wear CLEAN PAJAMAS to bed the night before surgery, wear comfortable clothes the morning of surgery  11. Place CLEAN SHEETS on your bed the night of your first shower and DO NOT SLEEP WITH PETS.    Day of Surgery:  Do not apply any deodorants/lotions.  Please wear clean clothes to the hospital/surgery center.   Remember to brush your teeth WITH YOUR REGULAR TOOTHPASTE.    Please read over the following fact sheets that you were given.

## 2020-09-17 NOTE — Progress Notes (Signed)
[facility] [place] - na   Chest x-ray - 09/17/20 EKG - 09/17/20 Stress Test - na ECHO - na Cardiac Cath - na  Sleep Study - na  Pt. States she is pre-diabetic, doesn't check blood sugars   Blood Thinner Instructions:  na Aspirin Instructions:  na   COVID TEST- 09/16/20 negative   Anesthesia review:   Patient denies shortness of breath, fever, cough and chest pain at PAT appointment   All instructions explained to the patient, with a verbal understanding of the material. Patient agrees to go over the instructions while at home for a better understanding. Patient also instructed to self quarantine after being tested for COVID-19. The opportunity to ask questions was provided.

## 2020-09-18 LAB — CULTURE, URINE COMPREHENSIVE

## 2020-09-18 NOTE — Anesthesia Preprocedure Evaluation (Addendum)
Anesthesia Evaluation  Patient identified by MRN, date of birth, ID band Patient awake    Reviewed: Allergy & Precautions, NPO status , Patient's Chart, lab work & pertinent test results  Airway Mallampati: II  TM Distance: >3 FB Neck ROM: Full    Dental  (+) Dental Advisory Given   Pulmonary Current Smoker and Patient abstained from smoking.,  Lung CA   breath sounds clear to auscultation       Cardiovascular negative cardio ROS   Rhythm:Regular Rate:Normal     Neuro/Psych negative neurological ROS     GI/Hepatic negative GI ROS, Neg liver ROS,   Endo/Other  negative endocrine ROS  Renal/GU negative Renal ROS     Musculoskeletal   Abdominal   Peds  Hematology negative hematology ROS (+)   Anesthesia Other Findings   Reproductive/Obstetrics                            Lab Results  Component Value Date   WBC 9.1 09/17/2020   HGB 15.8 (H) 09/17/2020   HCT 48.9 (H) 09/17/2020   MCV 90.4 09/17/2020   PLT 323 09/17/2020   Lab Results  Component Value Date   CREATININE 0.67 09/17/2020   BUN 14 09/17/2020   NA 138 09/17/2020   K 4.2 09/17/2020   CL 108 09/17/2020   CO2 19 (L) 09/17/2020    Anesthesia Physical Anesthesia Plan  ASA: II  Anesthesia Plan: General   Post-op Pain Management:    Induction: Intravenous  PONV Risk Score and Plan: 2 and Dexamethasone, Ondansetron and Treatment may vary due to age or medical condition  Airway Management Planned: Double Lumen EBT  Additional Equipment: Arterial line  Intra-op Plan:   Post-operative Plan: Extubation in OR and Possible Post-op intubation/ventilation  Informed Consent: I have reviewed the patients History and Physical, chart, labs and discussed the procedure including the risks, benefits and alternatives for the proposed anesthesia with the patient or authorized representative who has indicated his/her understanding  and acceptance.     Dental advisory given  Plan Discussed with: CRNA  Anesthesia Plan Comments: (2 large bore IV's. GETA with double lumen EBT. A-line.)       Anesthesia Quick Evaluation

## 2020-09-19 ENCOUNTER — Inpatient Hospital Stay (HOSPITAL_COMMUNITY): Payer: Self-pay

## 2020-09-19 ENCOUNTER — Other Ambulatory Visit: Payer: Self-pay

## 2020-09-19 ENCOUNTER — Encounter (HOSPITAL_COMMUNITY): Payer: Self-pay | Admitting: Thoracic Surgery (Cardiothoracic Vascular Surgery)

## 2020-09-19 ENCOUNTER — Inpatient Hospital Stay (HOSPITAL_COMMUNITY)
Admission: RE | Admit: 2020-09-19 | Discharge: 2020-09-23 | DRG: 164 | Disposition: A | Discharge status: Home / Self Care | Payer: Self-pay | Attending: Thoracic Surgery (Cardiothoracic Vascular Surgery) | Admitting: Thoracic Surgery (Cardiothoracic Vascular Surgery)

## 2020-09-19 ENCOUNTER — Encounter (HOSPITAL_COMMUNITY)
Admission: RE | Disposition: A | Discharge status: Home / Self Care | Payer: Self-pay | Source: Home / Self Care | Attending: Thoracic Surgery (Cardiothoracic Vascular Surgery)

## 2020-09-19 ENCOUNTER — Inpatient Hospital Stay (HOSPITAL_COMMUNITY): Payer: Self-pay | Admitting: Certified Registered Nurse Anesthetist

## 2020-09-19 ENCOUNTER — Inpatient Hospital Stay (HOSPITAL_COMMUNITY): Payer: Self-pay | Admitting: Physician Assistant

## 2020-09-19 DIAGNOSIS — Y92234 Operating room of hospital as the place of occurrence of the external cause: Secondary | ICD-10-CM | POA: Diagnosis not present

## 2020-09-19 DIAGNOSIS — R7303 Prediabetes: Secondary | ICD-10-CM | POA: Diagnosis present

## 2020-09-19 DIAGNOSIS — Z902 Acquired absence of lung [part of]: Secondary | ICD-10-CM

## 2020-09-19 DIAGNOSIS — F17210 Nicotine dependence, cigarettes, uncomplicated: Secondary | ICD-10-CM | POA: Diagnosis present

## 2020-09-19 DIAGNOSIS — N3090 Cystitis, unspecified without hematuria: Secondary | ICD-10-CM | POA: Diagnosis present

## 2020-09-19 DIAGNOSIS — E785 Hyperlipidemia, unspecified: Secondary | ICD-10-CM | POA: Diagnosis present

## 2020-09-19 DIAGNOSIS — R911 Solitary pulmonary nodule: Secondary | ICD-10-CM

## 2020-09-19 DIAGNOSIS — I10 Essential (primary) hypertension: Secondary | ICD-10-CM | POA: Diagnosis present

## 2020-09-19 DIAGNOSIS — Z4682 Encounter for fitting and adjustment of non-vascular catheter: Secondary | ICD-10-CM

## 2020-09-19 DIAGNOSIS — Y836 Removal of other organ (partial) (total) as the cause of abnormal reaction of the patient, or of later complication, without mention of misadventure at the time of the procedure: Secondary | ICD-10-CM | POA: Diagnosis not present

## 2020-09-19 DIAGNOSIS — Z791 Long term (current) use of non-steroidal anti-inflammatories (NSAID): Secondary | ICD-10-CM

## 2020-09-19 DIAGNOSIS — C3411 Malignant neoplasm of upper lobe, right bronchus or lung: Secondary | ICD-10-CM

## 2020-09-19 DIAGNOSIS — D3502 Benign neoplasm of left adrenal gland: Secondary | ICD-10-CM | POA: Diagnosis present

## 2020-09-19 DIAGNOSIS — J95812 Postprocedural air leak: Secondary | ICD-10-CM | POA: Diagnosis not present

## 2020-09-19 DIAGNOSIS — J939 Pneumothorax, unspecified: Secondary | ICD-10-CM

## 2020-09-19 DIAGNOSIS — Z79899 Other long term (current) drug therapy: Secondary | ICD-10-CM

## 2020-09-19 DIAGNOSIS — T8182XA Emphysema (subcutaneous) resulting from a procedure, initial encounter: Secondary | ICD-10-CM | POA: Diagnosis not present

## 2020-09-19 HISTORY — PX: NODE DISSECTION: SHX5269

## 2020-09-19 HISTORY — PX: LUNG REMOVAL, PARTIAL: SHX233

## 2020-09-19 HISTORY — PX: INTERCOSTAL NERVE BLOCK: SHX5021

## 2020-09-19 LAB — GLUCOSE, CAPILLARY
Glucose-Capillary: 96 mg/dL (ref 70–99)
Glucose-Capillary: 157 mg/dL — ABNORMAL HIGH (ref 70–99)
Glucose-Capillary: 238 mg/dL — ABNORMAL HIGH (ref 70–99)

## 2020-09-19 LAB — PREPARE RBC (CROSSMATCH)

## 2020-09-19 LAB — ABO/RH: ABO/RH(D): O POS

## 2020-09-19 SURGERY — LOBECTOMY, LUNG, ROBOT-ASSISTED, USING VATS
Anesthesia: General | Site: Chest | Laterality: Right

## 2020-09-19 MED ORDER — SENNOSIDES-DOCUSATE SODIUM 8.6-50 MG PO TABS
1.0000 | ORAL_TABLET | Freq: Every day | ORAL | Status: DC
  Administered 2020-09-19 – 2020-09-22 (×4): 1 via ORAL
  Filled 2020-09-19 (×4): qty 1

## 2020-09-19 MED ORDER — CHLORHEXIDINE GLUCONATE 0.12 % MT SOLN: 15.0000 mL | Freq: Once | OROMUCOSAL | Status: AC

## 2020-09-19 MED ORDER — CEFAZOLIN SODIUM-DEXTROSE 2-4 GM/100ML-% IV SOLN
2.0000 g | INTRAVENOUS | Status: AC
  Administered 2020-09-19: 2 g via INTRAVENOUS

## 2020-09-19 MED ORDER — LACTATED RINGERS IV SOLN: INTRAVENOUS | Status: DC | PRN

## 2020-09-19 MED ORDER — 0.9 % SODIUM CHLORIDE (POUR BTL) OPTIME
TOPICAL | Status: DC | PRN
  Administered 2020-09-19 (×2): 1000 mL

## 2020-09-19 MED ORDER — ALBUTEROL SULFATE (2.5 MG/3ML) 0.083% IN NEBU
2.5000 mg | INHALATION_SOLUTION | RESPIRATORY_TRACT | Status: DC
  Administered 2020-09-19 – 2020-09-20 (×4): 2.5 mg via RESPIRATORY_TRACT
  Filled 2020-09-19 (×5): qty 3

## 2020-09-19 MED ORDER — BISACODYL 5 MG PO TBEC
10.0000 mg | DELAYED_RELEASE_TABLET | Freq: Every day | ORAL | Status: DC
  Administered 2020-09-20 – 2020-09-23 (×4): 10 mg via ORAL
  Filled 2020-09-19 (×4): qty 2

## 2020-09-19 MED ORDER — ARTIFICIAL TEARS OPHTHALMIC OINT
TOPICAL_OINTMENT | OPHTHALMIC | Status: AC
  Filled 2020-09-19: qty 3.5

## 2020-09-19 MED ORDER — FENTANYL CITRATE (PF) 250 MCG/5ML IJ SOLN
INTRAMUSCULAR | Status: AC
  Filled 2020-09-19: qty 5

## 2020-09-19 MED ORDER — INSULIN ASPART 100 UNIT/ML ~~LOC~~ SOLN
0.0000 [IU] | Freq: Three times a day (TID) | SUBCUTANEOUS | Status: DC
  Administered 2020-09-20: 2 [IU] via SUBCUTANEOUS

## 2020-09-19 MED ORDER — ACETAMINOPHEN 500 MG PO TABS: 1000.0000 mg | ORAL_TABLET | Freq: Once | ORAL | Status: AC

## 2020-09-19 MED ORDER — ENOXAPARIN SODIUM 30 MG/0.3ML ~~LOC~~ SOLN
30.0000 mg | SUBCUTANEOUS | Status: DC
  Administered 2020-09-20 – 2020-09-23 (×4): 30 mg via SUBCUTANEOUS
  Filled 2020-09-19 (×4): qty 0.3

## 2020-09-19 MED ORDER — HEMOSTATIC AGENTS (NO CHARGE) OPTIME
TOPICAL | Status: DC | PRN
Start: 2020-09-19 — End: 2020-09-19
  Administered 2020-09-19: 2 via TOPICAL

## 2020-09-19 MED ORDER — PROPOFOL 10 MG/ML IV BOLUS
INTRAVENOUS | Status: AC
  Filled 2020-09-19: qty 20

## 2020-09-19 MED ORDER — METOCLOPRAMIDE HCL 5 MG/ML IJ SOLN
10.0000 mg | Freq: Four times a day (QID) | INTRAMUSCULAR | Status: DC
  Administered 2020-09-19 – 2020-09-23 (×13): 10 mg via INTRAVENOUS
  Filled 2020-09-19 (×13): qty 2

## 2020-09-19 MED ORDER — ACETAMINOPHEN 160 MG/5ML PO SOLN: 1000.0000 mg | Freq: Four times a day (QID) | ORAL | Status: DC

## 2020-09-19 MED ORDER — CHLORHEXIDINE GLUCONATE 0.12 % MT SOLN
OROMUCOSAL | Status: AC
  Administered 2020-09-19: 15 mL via OROMUCOSAL
  Filled 2020-09-19: qty 15

## 2020-09-19 MED ORDER — CEFAZOLIN SODIUM-DEXTROSE 2-4 GM/100ML-% IV SOLN
2.0000 g | Freq: Three times a day (TID) | INTRAVENOUS | Status: AC
  Administered 2020-09-19 – 2020-09-20 (×2): 2 g via INTRAVENOUS
  Filled 2020-09-19 (×2): qty 100

## 2020-09-19 MED ORDER — PHENYLEPHRINE HCL-NACL 10-0.9 MG/250ML-% IV SOLN
INTRAVENOUS | Status: AC
  Filled 2020-09-19: qty 500

## 2020-09-19 MED ORDER — DEXAMETHASONE SODIUM PHOSPHATE 10 MG/ML IJ SOLN
INTRAMUSCULAR | Status: AC
  Filled 2020-09-19: qty 1

## 2020-09-19 MED ORDER — ALBUMIN HUMAN 5 % IV SOLN: INTRAVENOUS | Status: DC | PRN

## 2020-09-19 MED ORDER — BUPIVACAINE LIPOSOME 1.3 % IJ SUSP
INTRAMUSCULAR | Status: DC | PRN
  Administered 2020-09-19: 100 mL

## 2020-09-19 MED ORDER — PHENYLEPHRINE 40 MCG/ML (10ML) SYRINGE FOR IV PUSH (FOR BLOOD PRESSURE SUPPORT)
PREFILLED_SYRINGE | INTRAVENOUS | Status: DC | PRN
  Administered 2020-09-19: 80 ug via INTRAVENOUS
  Administered 2020-09-19: 120 ug via INTRAVENOUS
  Administered 2020-09-19 (×3): 80 ug via INTRAVENOUS
  Administered 2020-09-19: 120 ug via INTRAVENOUS

## 2020-09-19 MED ORDER — MIDAZOLAM HCL 2 MG/2ML IJ SOLN
INTRAMUSCULAR | Status: AC
  Filled 2020-09-19: qty 2

## 2020-09-19 MED ORDER — ONDANSETRON HCL 4 MG/2ML IJ SOLN
INTRAMUSCULAR | Status: DC | PRN
  Administered 2020-09-19: 4 mg via INTRAVENOUS

## 2020-09-19 MED ORDER — MIDAZOLAM HCL 5 MG/5ML IJ SOLN
INTRAMUSCULAR | Status: DC | PRN
  Administered 2020-09-19: 1 mg via INTRAVENOUS

## 2020-09-19 MED ORDER — ORAL CARE MOUTH RINSE: 15.0000 mL | Freq: Once | OROMUCOSAL | Status: AC

## 2020-09-19 MED ORDER — LIDOCAINE 2% (20 MG/ML) 5 ML SYRINGE
INTRAMUSCULAR | Status: DC | PRN
  Administered 2020-09-19: 40 mg via INTRAVENOUS

## 2020-09-19 MED ORDER — DEXAMETHASONE SODIUM PHOSPHATE 10 MG/ML IJ SOLN
INTRAMUSCULAR | Status: DC | PRN
  Administered 2020-09-19: 5 mg via INTRAVENOUS

## 2020-09-19 MED ORDER — GABAPENTIN 300 MG PO CAPS
ORAL_CAPSULE | ORAL | Status: AC
  Administered 2020-09-19: 300 mg via ORAL
  Filled 2020-09-19: qty 1

## 2020-09-19 MED ORDER — BUPIVACAINE HCL (PF) 0.5 % IJ SOLN
INTRAMUSCULAR | Status: AC
  Filled 2020-09-19: qty 30

## 2020-09-19 MED ORDER — LIDOCAINE HCL (PF) 2 % IJ SOLN
INTRAMUSCULAR | Status: AC
  Filled 2020-09-19: qty 5

## 2020-09-19 MED ORDER — LACTATED RINGERS IV SOLN: INTRAVENOUS | Status: DC

## 2020-09-19 MED ORDER — ROCURONIUM BROMIDE 100 MG/10ML IV SOLN
INTRAVENOUS | Status: DC | PRN
  Administered 2020-09-19 (×2): 20 mg via INTRAVENOUS
  Administered 2020-09-19: 60 mg via INTRAVENOUS
  Administered 2020-09-19: 20 mg via INTRAVENOUS

## 2020-09-19 MED ORDER — PROPOFOL 10 MG/ML IV BOLUS
INTRAVENOUS | Status: DC | PRN
  Administered 2020-09-19: 120 mg via INTRAVENOUS

## 2020-09-19 MED ORDER — DEXTROSE-NACL 5-0.9 % IV SOLN: INTRAVENOUS | Status: DC

## 2020-09-19 MED ORDER — ROCURONIUM BROMIDE 10 MG/ML (PF) SYRINGE
PREFILLED_SYRINGE | INTRAVENOUS | Status: AC
  Filled 2020-09-19: qty 10

## 2020-09-19 MED ORDER — ONDANSETRON HCL 4 MG/2ML IJ SOLN
INTRAMUSCULAR | Status: AC
  Filled 2020-09-19: qty 2

## 2020-09-19 MED ORDER — CEFAZOLIN SODIUM-DEXTROSE 2-4 GM/100ML-% IV SOLN
INTRAVENOUS | Status: AC
  Filled 2020-09-19: qty 100

## 2020-09-19 MED ORDER — SODIUM CHLORIDE 0.9% IV SOLUTION: Freq: Once | INTRAVENOUS | Status: DC

## 2020-09-19 MED ORDER — BUPIVACAINE LIPOSOME 1.3 % IJ SUSP
20.0000 mL | Freq: Once | INTRAMUSCULAR | Status: DC
  Filled 2020-09-19: qty 20

## 2020-09-19 MED ORDER — SUGAMMADEX SODIUM 200 MG/2ML IV SOLN
INTRAVENOUS | Status: DC | PRN
  Administered 2020-09-19 (×2): 50 mg via INTRAVENOUS
  Administered 2020-09-19: 100 mg via INTRAVENOUS
  Administered 2020-09-19 (×2): 50 mg via INTRAVENOUS

## 2020-09-19 MED ORDER — ACETAMINOPHEN 500 MG PO TABS
ORAL_TABLET | ORAL | Status: AC
  Administered 2020-09-19: 1000 mg via ORAL
  Filled 2020-09-19: qty 2

## 2020-09-19 MED ORDER — PHENYLEPHRINE HCL-NACL 10-0.9 MG/250ML-% IV SOLN
INTRAVENOUS | Status: DC | PRN
  Administered 2020-09-19: 30 ug/min via INTRAVENOUS

## 2020-09-19 MED ORDER — OXYCODONE HCL 5 MG PO TABS
5.0000 mg | ORAL_TABLET | ORAL | Status: DC | PRN
  Administered 2020-09-19 – 2020-09-23 (×10): 10 mg via ORAL
  Filled 2020-09-19: qty 1
  Filled 2020-09-19 (×7): qty 2
  Filled 2020-09-19: qty 1
  Filled 2020-09-19 (×2): qty 2

## 2020-09-19 MED ORDER — FENTANYL CITRATE (PF) 250 MCG/5ML IJ SOLN
INTRAMUSCULAR | Status: DC | PRN
  Administered 2020-09-19 (×2): 50 ug via INTRAVENOUS
  Administered 2020-09-19: 100 ug via INTRAVENOUS
  Administered 2020-09-19: 50 ug via INTRAVENOUS

## 2020-09-19 MED ORDER — ONDANSETRON HCL 4 MG/2ML IJ SOLN: 4.0000 mg | Freq: Four times a day (QID) | INTRAMUSCULAR | Status: DC | PRN

## 2020-09-19 MED ORDER — SODIUM CHLORIDE 0.9 % IV SOLN
INTRAVENOUS | Status: AC | PRN
  Administered 2020-09-19: 1000 mL via INTRAMUSCULAR

## 2020-09-19 MED ORDER — ACETAMINOPHEN 500 MG PO TABS
1000.0000 mg | ORAL_TABLET | Freq: Four times a day (QID) | ORAL | Status: DC
  Administered 2020-09-19 – 2020-09-23 (×14): 1000 mg via ORAL
  Filled 2020-09-19 (×15): qty 2

## 2020-09-19 MED ORDER — FENTANYL CITRATE (PF) 100 MCG/2ML IJ SOLN
25.0000 ug | INTRAMUSCULAR | Status: DC | PRN
  Administered 2020-09-19: 50 ug via INTRAVENOUS

## 2020-09-19 MED ORDER — AMISULPRIDE (ANTIEMETIC) 5 MG/2ML IV SOLN: 10.0000 mg | Freq: Once | INTRAVENOUS | Status: DC | PRN

## 2020-09-19 MED ORDER — GABAPENTIN 300 MG PO CAPS: 300.0000 mg | ORAL_CAPSULE | Freq: Once | ORAL | Status: AC

## 2020-09-19 MED ORDER — ROSUVASTATIN CALCIUM 5 MG PO TABS
5.0000 mg | ORAL_TABLET | Freq: Every day | ORAL | Status: DC
  Administered 2020-09-20 – 2020-09-23 (×4): 5 mg via ORAL
  Filled 2020-09-19 (×4): qty 1

## 2020-09-19 MED ORDER — FENTANYL CITRATE (PF) 100 MCG/2ML IJ SOLN
INTRAMUSCULAR | Status: AC
  Administered 2020-09-19: 50 ug via INTRAVENOUS
  Filled 2020-09-19: qty 2

## 2020-09-19 MED ORDER — INSULIN ASPART 100 UNIT/ML ~~LOC~~ SOLN
0.0000 [IU] | SUBCUTANEOUS | Status: DC
  Administered 2020-09-19: 2 [IU] via SUBCUTANEOUS

## 2020-09-19 MED ORDER — FENTANYL CITRATE (PF) 100 MCG/2ML IJ SOLN
25.0000 ug | INTRAMUSCULAR | Status: DC | PRN
  Administered 2020-09-19 – 2020-09-21 (×7): 50 ug via INTRAVENOUS
  Filled 2020-09-19 (×7): qty 2

## 2020-09-19 SURGICAL SUPPLY — 118 items
APPLIER CLIP ROT 10 11.4 M/L (STAPLE)
BLADE CLIPPER SURG (BLADE) IMPLANT
BNDG COHESIVE 6X5 TAN STRL LF (GAUZE/BANDAGES/DRESSINGS) IMPLANT
CANISTER SUCT 3000ML PPV (MISCELLANEOUS) ×8 IMPLANT
CANNULA REDUC XI 12-8 STAPL (CANNULA) ×2
CANNULA REDUCER 12-8 DVNC XI (CANNULA) ×6 IMPLANT
CATH THORACIC 28FR (CATHETERS) IMPLANT
CATH THORACIC 28FR RT ANG (CATHETERS) IMPLANT
CATH THORACIC 36FR (CATHETERS) IMPLANT
CATH THORACIC 36FR RT ANG (CATHETERS) IMPLANT
CLIP APPLIE ROT 10 11.4 M/L (STAPLE) IMPLANT
CLIP VESOCCLUDE MED 6/CT (CLIP) IMPLANT
CNTNR URN SCR LID CUP LEK RST (MISCELLANEOUS) ×39 IMPLANT
CONN ST 1/4X3/8  BEN (MISCELLANEOUS) ×1
CONN ST 1/4X3/8 BEN (MISCELLANEOUS) ×3 IMPLANT
CONN Y 3/8X3/8X3/8  BEN (MISCELLANEOUS)
CONN Y 3/8X3/8X3/8 BEN (MISCELLANEOUS) IMPLANT
CONT SPEC 4OZ STRL OR WHT (MISCELLANEOUS) ×52
DEFOGGER SCOPE WARMER CLEARIFY (MISCELLANEOUS) ×4 IMPLANT
DERMABOND ADVANCED (GAUZE/BANDAGES/DRESSINGS) ×1
DERMABOND ADVANCED .7 DNX12 (GAUZE/BANDAGES/DRESSINGS) ×3 IMPLANT
DRAIN CHANNEL 28F RND 3/8 FF (WOUND CARE) ×4 IMPLANT
DRAIN CHANNEL 32F RND 10.7 FF (WOUND CARE) IMPLANT
DRAPE ARM DVNC X/XI (DISPOSABLE) ×12 IMPLANT
DRAPE COLUMN DVNC XI (DISPOSABLE) ×3 IMPLANT
DRAPE CV SPLIT W-CLR ANES SCRN (DRAPES) ×4 IMPLANT
DRAPE DA VINCI XI ARM (DISPOSABLE) ×4
DRAPE DA VINCI XI COLUMN (DISPOSABLE) ×1
DRAPE INCISE IOBAN 66X45 STRL (DRAPES) IMPLANT
DRAPE ORTHO SPLIT 77X108 STRL (DRAPES) ×1
DRAPE SURG ORHT 6 SPLT 77X108 (DRAPES) ×3 IMPLANT
DRAPE WARM FLUID 44X44 (DRAPES) IMPLANT
ELECT BLADE 6.5 EXT (BLADE) ×4 IMPLANT
ELECT REM PT RETURN 9FT ADLT (ELECTROSURGICAL) ×4
ELECTRODE REM PT RTRN 9FT ADLT (ELECTROSURGICAL) ×3 IMPLANT
GAUZE KITTNER 4X5 RF (MISCELLANEOUS) ×12 IMPLANT
GAUZE SPONGE 4X4 12PLY STRL (GAUZE/BANDAGES/DRESSINGS) IMPLANT
GLOVE BIO SURGEON STRL SZ 6.5 (GLOVE) ×4 IMPLANT
GLOVE SURG SS PI 8.0 STRL IVOR (GLOVE) ×4 IMPLANT
GLOVE SURG SYN 7.5  E (GLOVE)
GLOVE SURG SYN 7.5 E (GLOVE) IMPLANT
GLOVE TRIUMPH SURG SIZE 7.5 (KITS) ×8 IMPLANT
GOWN STRL REUS W/ TWL LRG LVL3 (GOWN DISPOSABLE) ×6 IMPLANT
GOWN STRL REUS W/ TWL XL LVL3 (GOWN DISPOSABLE) ×6 IMPLANT
GOWN STRL REUS W/TWL 2XL LVL3 (GOWN DISPOSABLE) ×8 IMPLANT
GOWN STRL REUS W/TWL LRG LVL3 (GOWN DISPOSABLE) ×8
GOWN STRL REUS W/TWL XL LVL3 (GOWN DISPOSABLE) ×8
HEMOSTAT SURGICEL 2X14 (HEMOSTASIS) ×8 IMPLANT
IRRIGATION STRYKERFLOW (MISCELLANEOUS) ×3 IMPLANT
IRRIGATOR STRYKERFLOW (MISCELLANEOUS) ×4
KIT BASIN OR (CUSTOM PROCEDURE TRAY) ×4 IMPLANT
KIT SUCTION CATH 14FR (SUCTIONS) IMPLANT
KIT TURNOVER KIT B (KITS) ×4 IMPLANT
LOOP VESSEL SUPERMAXI WHITE (MISCELLANEOUS) IMPLANT
NEEDLE HYPO 25GX1X1/2 BEV (NEEDLE) ×4 IMPLANT
NEEDLE SPNL 22GX3.5 QUINCKE BK (NEEDLE) ×4 IMPLANT
NS IRRIG 1000ML POUR BTL (IV SOLUTION) ×8 IMPLANT
OBTURATOR OPTICAL STANDARD 8MM (TROCAR)
OBTURATOR OPTICAL STND 8 DVNC (TROCAR)
OBTURATOR OPTICALSTD 8 DVNC (TROCAR) IMPLANT
PACK CHEST (CUSTOM PROCEDURE TRAY) ×4 IMPLANT
PAD ARMBOARD 7.5X6 YLW CONV (MISCELLANEOUS) ×8 IMPLANT
PORT ACCESS TROCAR AIRSEAL 12 (TROCAR) ×3 IMPLANT
PORT ACCESS TROCAR AIRSEAL 5M (TROCAR) ×1
RELOAD STAPLER 2.5X45 WHT DVNC (STAPLE) ×9 IMPLANT
RELOAD STAPLER 3.5X45 BLU DVNC (STAPLE) ×33 IMPLANT
RELOAD STAPLER 4.3X45 GRN DVNC (STAPLE) ×3 IMPLANT
SCISSORS LAP 5X35 DISP (ENDOMECHANICALS) IMPLANT
SEAL CANN UNIV 5-8 DVNC XI (MISCELLANEOUS) ×6 IMPLANT
SEAL XI 5MM-8MM UNIVERSAL (MISCELLANEOUS) ×2
SEALANT PROGEL (MISCELLANEOUS) IMPLANT
SEALANT SURG COSEAL 4ML (VASCULAR PRODUCTS) IMPLANT
SEALANT SURG COSEAL 8ML (VASCULAR PRODUCTS) IMPLANT
SET TRI-LUMEN FLTR TB AIRSEAL (TUBING) ×4 IMPLANT
SET TUBE SMOKE EVAC HIGH FLOW (TUBING) IMPLANT
SHEARS HARMONIC HDI 20CM (ELECTROSURGICAL) IMPLANT
SOLUTION ELECTROLUBE (MISCELLANEOUS) ×4 IMPLANT
SPONGE INTESTINAL PEANUT (DISPOSABLE) IMPLANT
SPONGE TONSIL TAPE 1 RFD (DISPOSABLE) IMPLANT
STAPLER 45 SUREFORM CVD (STAPLE) ×2
STAPLER 45 SUREFORM CVD DVNC (STAPLE) ×6 IMPLANT
STAPLER CANNULA SEAL DVNC XI (STAPLE) ×6 IMPLANT
STAPLER CANNULA SEAL XI (STAPLE) ×2
STAPLER RELOAD 2.5X45 WHITE (STAPLE) ×3
STAPLER RELOAD 2.5X45 WHT DVNC (STAPLE) ×9
STAPLER RELOAD 3.5X45 BLU DVNC (STAPLE) ×33
STAPLER RELOAD 3.5X45 BLUE (STAPLE) ×11
STAPLER RELOAD 4.3X45 GREEN (STAPLE) ×1
STAPLER RELOAD 4.3X45 GRN DVNC (STAPLE) ×3
SUT PDS AB 3-0 SH 27 (SUTURE) IMPLANT
SUT PROLENE 4 0 RB 1 (SUTURE)
SUT PROLENE 4-0 RB1 .5 CRCL 36 (SUTURE) IMPLANT
SUT SILK  1 MH (SUTURE) ×1
SUT SILK 1 MH (SUTURE) ×3 IMPLANT
SUT SILK 1 TIES 10X30 (SUTURE) ×8 IMPLANT
SUT SILK 2 0 SH (SUTURE) ×4 IMPLANT
SUT SILK 2 0SH CR/8 30 (SUTURE) IMPLANT
SUT SILK 3 0SH CR/8 30 (SUTURE) IMPLANT
SUT VIC AB 1 CTX 36 (SUTURE) ×4
SUT VIC AB 1 CTX36XBRD ANBCTR (SUTURE) ×3 IMPLANT
SUT VIC AB 2-0 CTX 36 (SUTURE) ×4 IMPLANT
SUT VIC AB 3-0 MH 27 (SUTURE) IMPLANT
SUT VIC AB 3-0 X1 27 (SUTURE) ×8 IMPLANT
SUT VICRYL 0 TIES 12 18 (SUTURE) ×4 IMPLANT
SUT VICRYL 0 UR6 27IN ABS (SUTURE) ×8 IMPLANT
SUT VICRYL 2 TP 1 (SUTURE) IMPLANT
SYR 20CC LL (SYRINGE) ×8 IMPLANT
SYR 20ML LL LF (SYRINGE) IMPLANT
SYSTEM RETRIEVAL ANCHOR 12 (MISCELLANEOUS) ×4 IMPLANT
SYSTEM SAHARA CHEST DRAIN ATS (WOUND CARE) ×4 IMPLANT
TAPE CLOTH 4X10 WHT NS (GAUZE/BANDAGES/DRESSINGS) ×4 IMPLANT
TIP APPLICATOR SPRAY EXTEND 16 (VASCULAR PRODUCTS) IMPLANT
TOWEL GREEN STERILE (TOWEL DISPOSABLE) ×8 IMPLANT
TRAY FOLEY MTR SLVR 16FR STAT (SET/KITS/TRAYS/PACK) ×4 IMPLANT
TROCAR BLADELESS 15MM (ENDOMECHANICALS) IMPLANT
TROCAR XCEL 12X100 BLDLESS (ENDOMECHANICALS) IMPLANT
TROCAR XCEL BLADELESS 5X75MML (TROCAR) IMPLANT
WATER STERILE IRR 1000ML POUR (IV SOLUTION) ×4 IMPLANT

## 2020-09-19 NOTE — Anesthesia Procedure Notes (Signed)
Arterial Line Insertion Start/End12/07/2020 6:50 AM Performed by: Janene Harvey, CRNA  Patient location: Pre-op. Preanesthetic checklist: patient identified, IV checked and risks and benefits discussed Lidocaine 1% used for infiltration Left, radial was placed Catheter size: 20 G Hand hygiene performed  and maximum sterile barriers used  Allen's test indicative of satisfactory collateral circulation Attempts: 1 Procedure performed without using ultrasound guided technique. Following insertion, dressing applied and Biopatch. Post procedure assessment: normal  Patient tolerated the procedure well with no immediate complications. Additional procedure comments: Vernelle Emerald SRNA placed arterial line.Marland Kitchen

## 2020-09-19 NOTE — Brief Op Note (Addendum)
09/19/2020  10:57 AM  PATIENT:  Stacie Kennedy  59 y.o. 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)  PROCEDURE:  Procedure(s):  XI ROBOTIC ASSISTED THORASCOPY -Right Upper Lobe Wedge Resection  -Completion Right Upper Lobectomy -Lymph Node Dissection -Intercostal Nerve Block levels 3-10  SURGEON:  Surgeon(s) and Role:    * Melrose Nakayama, MD - Primary  PHYSICIAN ASSISTANT: Ellwood Handler PA-C  ANESTHESIA:   general  EBL:  200 mL   BLOOD ADMINISTERED:none  DRAINS: Blake Drain Right Chest   LOCAL MEDICATIONS USED:  BUPIVICAINE -30 ml 0.5%, 20 ml liposoma  SPECIMEN:  Source of Specimen:  Wedge Resection Right Upper Lobe, Completion Right Upper Lobe, Lymph Nodes  DISPOSITION OF SPECIMEN:  PATHOLOGY  COUNTS:  YES  TOURNIQUET:  * No tourniquets in log *  DICTATION: .Dragon Dictation  PLAN OF CARE: Admit to inpatient   PATIENT DISPOSITION:  PACU - hemodynamically stable.   Delay start of Pharmacological VTE agent (>24hrs) due to surgical blood loss or risk of bleeding: no  FROZEN= Non-small cell carcinoma

## 2020-09-19 NOTE — Transfer of Care (Addendum)
Immediate Anesthesia Transfer of Care Note  Patient: Stacie Kennedy  Procedure(s) Performed: XI ROBOTIC ASSISTED THORASCOPY-LOBECTOMY (Right Chest) INTERCOSTAL NERVE BLOCK NODE DISSECTION (Right )  Patient Location: PACU  Anesthesia Type:General  Level of Consciousness: drowsy and patient cooperative  Airway & Oxygen Therapy: Patient Spontanous Breathing and Patient connected to face mask oxygen  Post-op Assessment: Report given to RN and Post -op Vital signs reviewed and stable  Post vital signs: Reviewed  Last Vitals:  Vitals Value Taken Time  BP 103/73 09/19/20 1117  Temp    Pulse 64 09/19/20 1122  Resp 18 09/19/20 1122  SpO2 94% 09/19/20 1122  Vitals shown include unvalidated device data.  Last Pain:  Vitals:   09/19/20 0607  TempSrc:   PainSc: 0-No pain         Complications: No complications documented.

## 2020-09-19 NOTE — Hospital Course (Addendum)
History of Present Illness:  Ms. Grumbine is a 59 yo female with history of Hypertension, Hyperlipidemia, prediabetes, and tobacco abuse of 1ppd x 43 years.  She recently underwent a low dose screening CT scan and was found to have a 1.5 cm right upper lobe nodule.  PET CT scan was performed and showed the nodule to be hypermetabolic with a SUV of 7.9.  There was no evidence of regional/distant or metastatic disease.  The patient was referred to Dr. Roxan Hockey for surgical evaluation.  She was evaluated on 08/28/2020 at which time she denied chest pain, pressure, and tightness.  She admitted to experiencing shortness of breath with heave exertion.  She denied weight loss and appetite changes.  It was felt the patient should undergo a surgical resection of the nodule for definitive diagnosis.  The risks and benefits of the procedure were explained to the patient and she was agreeable to proceed.  Hospital Course:  Ms. Dunne presented to [facility] on 09/19/2020.  She was taken to the operating room and underwent Robotic Assisted Thoracoscopy with Wedge Resection of Right Upper Lobe Lung Nodule, Completion Lobectomy of Right Upper Lobe, Lymph Node Dissection, and Intercostal Nerve Block.  She tolerated the procedure without difficulty, was extubated, and taken to the PACU in stable condition.  The patient had a small air leak post operatively.  Her CXR remained stable without significant pneumothorax.  Her CT output decreased over the next several days.  Her air leak resolved and her chest tube was removed on 09/22/2020.  Follow up CXR showed ***.  The patient is ambulating without difficulty.  Her surgical incisions are healing without evidence of infection.  She is medically stable for discharge home today.

## 2020-09-19 NOTE — Anesthesia Postprocedure Evaluation (Signed)
Anesthesia Post Note  Patient: Peightyn Roberson  Procedure(s) Performed: XI ROBOTIC ASSISTED THORASCOPY-LOBECTOMY (Right Chest) INTERCOSTAL NERVE BLOCK NODE DISSECTION (Right )     Patient location during evaluation: PACU Anesthesia Type: General Level of consciousness: awake and alert Pain management: pain level controlled Vital Signs Assessment: post-procedure vital signs reviewed and stable Respiratory status: spontaneous breathing, nonlabored ventilation, respiratory function stable and patient connected to nasal cannula oxygen Cardiovascular status: blood pressure returned to baseline and stable Postop Assessment: no apparent nausea or vomiting Anesthetic complications: no   No complications documented.  Last Vitals:  Vitals:   09/19/20 1245 09/19/20 1300  BP:  108/66  Pulse: (!) 53 (!) 53  Resp: 14 (!) 8  Temp:    SpO2: 93% 92%    Last Pain:  Vitals:   09/19/20 1212  TempSrc:   PainSc: Tyler Deis

## 2020-09-19 NOTE — Discharge Instructions (Signed)
Robot-Assisted Thoracic Surgery, Care After This sheet gives you information about how to care for yourself after your procedure. Your health care provider may also give you more specific instructions. If you have problems or questions, contact your health care provider. What can I expect after the procedure? After the procedure, it is common to have:  Some pain and aches in the area of your surgical cuts (incisions).  Pain when breathing in (inhaling) and coughing.  Tiredness (fatigue).  Trouble sleeping.  Constipation. Follow these instructions at home: Medicines  Take over-the-counter and prescription medicines only as told by your health care provider.  If you were prescribed an antibiotic medicine, take it as told by your health care provider. Do not stop taking the antibiotic even if you start to feel better.  Talk with your health care provider about safe and effective ways to manage pain after your procedure. Pain management should fit your specific health needs.  Take prescription pain medicine before pain becomes severe. Relieving and controlling your pain will make breathing easier for you. Activity  Return to your normal activities as told by your health care provider. Ask your health care provider what activities are safe for you.  Do not lift anything that is heavier than 10 lb (4.5 kg), or the limit that you are told, until your health care provider says that it is safe.  Avoid sitting for a long time without moving. Get up and move around one or more times every few hours. Bathing  Do not take baths, swim, or use a hot tub until your health care provider approves. You may take showers. Incision care  Follow instructions from your health care provider about how to take care of your incision(s). Make sure you: ? Wash your hands with soap and water before you change your bandage (dressing). If soap and water are not available, use hand sanitizer. ? Change your  dressing as told by your health care provider. ? Leave stitches (sutures), skin glue, or adhesive strips in place. These skin closures may need to stay in place for 2 weeks or longer. If adhesive strip edges start to loosen and curl up, you may trim the loose edges. Do not remove adhesive strips completely unless your health care provider tells you to do that.  Check your incision area every day for signs of infection. Check for: ? Redness, swelling, or pain. ? Fluid or blood. ? Warmth. ? Pus or a bad smell. Driving  Ask your health care provider when it is safe for you to drive.  Do not drive or use heavy machinery while taking prescription pain medicine. Eating and drinking  Follow instructions from your health care provider about eating or drinking restrictions. These will vary depending on what procedure you had. Your health care provider may recommend: ? A liquid diet or soft diet for the first few days. ? Meals that are smaller and more frequent. ? A diet of fruits, vegetables, whole grains, and low-fat proteins. ? Limiting foods that are high in processed sugar and fat, including fried and sweet foods. Pneumonia prevention   Do not use any products that contain nicotine or tobacco, such as cigarettes and e-cigarettes. If you need help quitting, ask your health care provider.  Avoid secondhand smoke.  Do deep breathing exercises and cough regularly as directed. This helps to clear mucus and prevent pneumonia. If it hurts to cough, try one of these methods to ease your pain when you cough: ? Hold a  pillow against your chest. ? Place the palms of both hands over your incisions (use splinting).  Use an incentive spirometer as directed. This device measures how much air your lungs are getting with each breath. Using this will improve your breathing.  Do pulmonary rehabilitation as directed. This is a program that includes exercise, education, and support. General  instructions  Wear compression stockings as told by your health care provider. These stockings help to prevent blood clots and reduce swelling in your legs.  If you have a drainage tube: ? Follow instructions from your health care provider about how to take care of it. ? Do not travel by airplane after your tube is removed until your health care provider tells you it is safe.  To prevent or treat constipation while you are taking prescription pain medicine, your health care provider may recommend that you: ? Drink enough fluid to keep your urine pale yellow. ? Take over-the-counter or prescription medicines. ? Eat foods that are high in fiber, such as fresh fruits and vegetables, whole grains, and beans. ? Limit foods that are high in fat and processed sugars, such as fried and sweet foods.  Keep all follow-up visits as told by your health care provider. This is important. Contact a health care provider if:  You have redness, swelling, or pain around an incision.  You have fluid or blood coming from an incision.  An incision feels warm to the touch.  You have pus or a bad smell coming from an incision.  You have a fever.  You cannot eat or drink without vomiting.  Your prescription pain medicine is not controlling your pain. Get help right away if:  You have chest pain.  Your heart is beating quickly.  You have trouble breathing.  You have trouble speaking.  You are confused.  You feel weak or dizzy, or you faint. These symptoms may represent a serious problem that is an emergency. Do not wait to see if the symptoms will go away. Get medical help right away. Call your local emergency services (911 in the [place] Do not drive yourself to the hospital. Summary  Talk with your health care provider about safe and effective ways to manage pain after your procedure. Pain management should fit your specific health needs.  Return to your normal activities as told by your health  care provider. Ask your health care provider what activities are safe for you.  Do deep breathing exercises and cough regularly as directed. This helps to clear mucus and prevent pneumonia. If it hurts to cough, ease pain by holding a pillow against your chest or by placing the palms of both hands over your incisions (splinting). This information is not intended to replace advice given to you by your health care provider. Make sure you discuss any questions you have with your health care provider. Document Revised: 07/27/2019 Document Reviewed: 01/31/2017 Elsevier Patient Education  [place].

## 2020-09-19 NOTE — Interval H&P Note (Signed)
History and Physical Interval Note: PFTs show moderate COPD, adequate to tolerate upper lobectomy 09/19/2020 7:16 AM  Stacie Kennedy  has presented today for surgery, with the diagnosis of RUL LUNG NODULE.  The various methods of treatment have been discussed with the patient and family. After consideration of risks, benefits and other options for treatment, the patient has consented to  Procedure(s): XI ROBOTIC ASSISTED THORASCOPY-WEDGE RESECTION (Right) possible XI ROBOTIC ASSISTED THORASCOPY-LOBECTOMY (Right) as a surgical intervention.  The patient's history has been reviewed, patient examined, no change in status, stable for surgery.  I have reviewed the patient's chart and labs.  Questions were answered to the patient's satisfaction.     Melrose Nakayama

## 2020-09-19 NOTE — Anesthesia Procedure Notes (Signed)
Procedure Name: Intubation Date/Time: 09/19/2020 7:39 AM Performed by: Janene Harvey, CRNA Pre-anesthesia Checklist: Patient identified, Emergency Drugs available, Suction available and Patient being monitored Patient Re-evaluated:Patient Re-evaluated prior to induction Oxygen Delivery Method: Circle system utilized Preoxygenation: Pre-oxygenation with 100% oxygen Induction Type: IV induction Ventilation: Mask ventilation without difficulty Laryngoscope Size: Mac and 4 Grade View: Grade I Tube type: Oral Endobronchial tube: Double lumen EBT and Left and 37 Fr Number of attempts: 1 Airway Equipment and Method: Stylet Placement Confirmation: ETT inserted through vocal cords under direct vision,  positive ETCO2 and breath sounds checked- equal and bilateral Secured at: 30 cm Tube secured with: Tape Dental Injury: Teeth and Oropharynx as per pre-operative assessment  Comments: DLT placed by [place]

## 2020-09-20 ENCOUNTER — Encounter (HOSPITAL_COMMUNITY): Payer: Self-pay | Admitting: Thoracic Surgery (Cardiothoracic Vascular Surgery)

## 2020-09-20 ENCOUNTER — Inpatient Hospital Stay (HOSPITAL_COMMUNITY): Payer: Self-pay

## 2020-09-20 LAB — GLUCOSE, CAPILLARY
Glucose-Capillary: 146 mg/dL — ABNORMAL HIGH (ref 70–99)
Glucose-Capillary: 106 mg/dL — ABNORMAL HIGH (ref 70–99)
Glucose-Capillary: 108 mg/dL — ABNORMAL HIGH (ref 70–99)
Glucose-Capillary: 97 mg/dL (ref 70–99)

## 2020-09-20 LAB — BASIC METABOLIC PANEL
Anion gap: 7 (ref 5–15)
BUN: 7 mg/dL (ref 6–20)
CO2: 22 mmol/L (ref 22–32)
Calcium: 8.4 mg/dL — ABNORMAL LOW (ref 8.9–10.3)
Chloride: 109 mmol/L (ref 98–111)
Creatinine, Ser: 0.67 mg/dL (ref 0.44–1.00)
GFR, Estimated: >60 mL/min (ref >60)
Glucose, Bld: 188 mg/dL — ABNORMAL HIGH (ref 70–99)
Potassium: 3.7 mmol/L (ref 3.5–5.1)
Sodium: 138 mmol/L (ref 135–145)

## 2020-09-20 LAB — CBC
HCT: 39.1 % (ref 36.0–46.0)
Hemoglobin: 12.4 g/dL (ref 12.0–15.0)
MCH: 29.1 pg (ref 26.0–34.0)
MCHC: 31.7 g/dL (ref 30.0–36.0)
MCV: 91.8 fL (ref 80.0–100.0)
Platelets: 245 10*3/uL (ref 150–400)
RBC: 4.26 MIL/uL (ref 3.87–5.11)
RDW: 15 % (ref 11.5–15.5)
WBC: 10.8 10*3/uL — ABNORMAL HIGH (ref 4.0–10.5)
nRBC: 0 % (ref 0.0–0.2)

## 2020-09-20 MED ORDER — POTASSIUM CHLORIDE CRYS ER 20 MEQ PO TBCR
20.0000 meq | EXTENDED_RELEASE_TABLET | ORAL | Status: AC
  Administered 2020-09-20 (×3): 20 meq via ORAL
  Filled 2020-09-20 (×3): qty 1

## 2020-09-20 MED ORDER — KETOROLAC TROMETHAMINE 15 MG/ML IJ SOLN
7.5000 mg | Freq: Four times a day (QID) | INTRAMUSCULAR | Status: AC
  Administered 2020-09-20 – 2020-09-21 (×5): 7.5 mg via INTRAVENOUS
  Filled 2020-09-20 (×5): qty 1

## 2020-09-20 MED ORDER — CHLORHEXIDINE GLUCONATE CLOTH 2 % EX PADS
6.0000 | MEDICATED_PAD | Freq: Every day | CUTANEOUS | Status: DC
  Administered 2020-09-20 – 2020-09-21 (×2): 6 via TOPICAL

## 2020-09-20 MED ORDER — LEVALBUTEROL HCL 0.63 MG/3ML IN NEBU
0.6300 mg | INHALATION_SOLUTION | Freq: Four times a day (QID) | RESPIRATORY_TRACT | Status: DC
  Administered 2020-09-20 – 2020-09-21 (×4): 0.63 mg via RESPIRATORY_TRACT
  Filled 2020-09-20 (×4): qty 3

## 2020-09-20 MED ORDER — ALBUTEROL SULFATE (2.5 MG/3ML) 0.083% IN NEBU: 2.5000 mg | INHALATION_SOLUTION | RESPIRATORY_TRACT | Status: DC | PRN

## 2020-09-20 MED ORDER — LEVALBUTEROL TARTRATE 45 MCG/ACT IN AERO: 1.0000 | INHALATION_SPRAY | Freq: Four times a day (QID) | RESPIRATORY_TRACT | Status: DC

## 2020-09-20 NOTE — Plan of Care (Signed)

## 2020-09-20 NOTE — Progress Notes (Signed)
1 Day Post-Op Procedure(s) (LRB): XI ROBOTIC ASSISTED THORASCOPY-LOBECTOMY (Right) INTERCOSTAL NERVE BLOCK NODE DISSECTION (Right) Subjective: No complaints  Objective: Vital signs in last 24 hours: Temp:  [97.6 F (36.4 C)-98.7 F (37.1 C)] 97.6 F (36.4 C) (12/11 0700) Pulse Rate:  [53-79] 79 (12/11 0900) Cardiac Rhythm: Normal sinus rhythm (12/11 0700) Resp:  [8-23] 20 (12/11 0900) BP: (92-133)/(60-85) 108/65 (12/11 0900) SpO2:  [90 %-99 %] 97 % (12/11 0900) Arterial Line BP: (96-119)/(41-58) 119/58 (12/10 1300)  Hemodynamic parameters for last 24 hours:    Intake/Output from previous day: 12/10 0701 - 12/11 0700 In: 4375 [P.O.:240; I.V.:3185; IV Piggyback:950] Out: 8937 [Urine:1160; Blood:200; Chest Tube:295] Intake/Output this shift: No intake/output data recorded.  General appearance: alert and cooperative Neurologic: intact Heart: regular rate and rhythm, S1, S2 normal, no murmur, click, rub or gallop Lungs: clear to auscultation bilaterally Abdomen: soft, non-tender; bowel sounds normal; no masses,  no organomegaly Wound: c/d/i  Lab Results: Recent Labs    09/17/20 1143 09/20/20 0048  WBC 9.1 10.8*  HGB 15.8* 12.4  HCT 48.9* 39.1  PLT 323 245   BMET:  Recent Labs    09/17/20 1143 09/20/20 0048  NA 138 138  K 4.2 3.7  CL 108 109  CO2 19* 22  GLUCOSE 95 188*  BUN 14 7  CREATININE 0.67 0.67  CALCIUM 9.5 8.4*    PT/INR:  Recent Labs    09/17/20 1143  LABPROT 12.1  INR 0.9   ABG    Component Value Date/Time   PHART 7.413 09/17/2020 1201   HCO3 21.2 09/17/2020 1201   ACIDBASEDEF 2.7 (H) 09/17/2020 1201   O2SAT 96.0 09/17/2020 1201   CBG (last 3)  Recent Labs    09/19/20 1602 09/19/20 2000 09/20/20 0611  GLUCAP 157* 238* 146*    Assessment/Plan: S/P Procedure(s) (LRB): XI ROBOTIC ASSISTED THORASCOPY-LOBECTOMY (Right) INTERCOSTAL NERVE BLOCK NODE DISSECTION (Right) Mobilize pain control  Leave chest tube to water seal with  small air leak   LOS: 1 day    Wonda Olds 09/20/2020

## 2020-09-20 NOTE — Op Note (Signed)
NAME: Stacie Kennedy, Stacie Kennedy MEDICAL RECORD ID:[id number] ACCOUNT [id number] DATE OF BIRTH:Nov 19, 1960 FACILITY: [facility] LOCATION: [facility] PHYSICIAN:Aldonia Keeven C. Grey Rakestraw, MD  OPERATIVE REPORT  DATE OF PROCEDURE:  09/19/2020  PREOPERATIVE DIAGNOSIS:  Right upper lobe lung nodule, suspected non-small cell carcinoma, clinical stage IA (T1, N0).  POSTOPERATIVE DIAGNOSIS:  Nonsmall cell carcinoma, right upper lobe, clinical stage IA (T1, N0).  PROCEDURE:   Xi robotic-assisted right thoracoscopy, Right upper lobe wedge resection, Right upper lobectomy, Mediastinal lymph node dissection, Intercostal nerve block levels 3 through 10.  SURGEON:  Modesto Charon, MD  ASSISTANT:  Ellwood Handler, PA  ANESTHESIA:  General.  FINDINGS:  Nodule in lateral aspect of right upper lobe.  Frozen section revealed nonsmall cell carcinoma.  CLINICAL NOTE:  Stacie Kennedy is a 59 year old woman with a history of tobacco abuse, who had a low-dose CT for lung cancer screening that showed a 1.5 cm right upper lobe nodule.  The nodule was hypermetabolic on PET/CT.  She was advised to undergo  surgical resection.  The indications, risks, benefits, and alternatives were discussed in detail with the patient.  She understood and accepted the risks and agreed to proceed.  OPERATIVE NOTE:  Stacie Kennedy was brought to the preoperative holding area on 09/19/2020.  Anesthesia placed an arterial blood pressure monitoring line and a central venous catheter.  She was taken to the operating room, anesthetized and intubated with a  double lumen endotracheal tube.  A Foley catheter was placed.  Sequential compression devices were placed on the calves for DVT prophylaxis.  She was placed in a left lateral decubitus position.  Active warming was in place, the right chest was prepped  and draped in the usual sterile fashion.  Single lung ventilation of the left lung was initiated and was tolerated well throughout the procedure.  A timeout was  performed.  A solution containing 20 mL of liposomal bupivacaine, 30 mL of 0.5% bupivacaine and 50 mL of saline was prepared.  This was used for local at the incision sites as well as for the intercostal nerve blocks.  An incision was made  in the 8th interspace in the midaxillary line.  An 8 mm port was inserted.  The thoracoscope was advanced into the chest.  Carbon dioxide was insufflated into the chest per protocol.  A 12 mm port was placed anterior in the 8th interspace.  Intercostal  nerve blocks then were performed from the 3rd to the 10th interspace.  10 mL of the bupivacaine solution was injected into a subpleural plane at each level.  Two additional 8th interspace ports then were placed and finally an AirSeal port was placed  posteriorly in the 10th interspace.  There were some adhesions of the lung to the apex.  These were taken down with bipolar cautery.  The nodule was visible in the lateral aspect of the right upper lobe.  A wedge resection was performed with sequential firings of the robotic stapler using  blue cartridges.  The specimen was placed into an endoscopic retrieval bag, removed and sent for frozen section.  Because of the high index of suspicion the node dissection continued while awaiting the frozen section results.  The frozen section  ultimately returned showing nonsmall cell carcinoma.  The lung was retracted superiorly, and the inferior pulmonary ligament was divided with cautery.  The hilar reflection was divided circumferentially with bipolar cautery.  Level 9, 8 and 7 nodes were  removed.  There was a lot of inflammation and relatively large bronchial arterial vessels  around the level 11 node, which was removed.  Superiorly a level 10 node was removed and then the pleura was incised over the azygos vein in the mediastinum and the  level 4R nodes were removed en bloc.  By this point, the frozen section had returned showing nonsmall cell carcinoma.  Attention then was turned  to the fissure.  The pulmonary artery was identified and the major fissure was completed with sequential  firings of the Echelon stapler.  The lung was retracted posteriorly.  The middle lobe vein was identified.  The upper lobe branches to the superior pulmonary vein were encircled and divided with the robotic stapler using a vascular cartridge.  The minor  fissure then was completed working from both anteriorly and posteriorly.  The posterior ascending branch was identified.  It was dissected out and divided with the stapler and then the anterior and apical branches, which arose as a common trunk were  divided as well.  The robotic stapler was placed across the right upper lobe bronchus at its origin.  A green cartridge was used.  The stapler was closed.  A test inflation showed good aeration of the lower and middle lobes.  The staple was fired  transecting the bronchus.  The middle lobe, then was tacked to the lower lobe using the robotic stapler as well.  The right upper lobe was placed into an endoscopic retrieval bag and brought down to the inferior aspect of the chest.  The robotic  instruments were removed.  The robot was undocked.  The anterior 8th interspace incision was lengthened to approximately 3 cm and the upper lobe was removed and sent for permanent pathology.  The chest was copiously irrigated with warm saline.   Hemostasis was achieved.  A 28-French Blake drain was placed through the original port incision and directed to the apex.  It was secured with a #1 silk suture and placed to a Pleur-evac on waterseal.  Dual-lung ventilation was resumed.  The remaining  incisions were closed in standard fashion.  The patient was placed back in supine position.  She was extubated in the operating room and taken to the [facility] in good condition.  All sponge, needle and instrument counts were correct at the end of the procedure. HN/NUANCE  D:09/19/2020 T:09/20/2020  JOB:[id number]

## 2020-09-20 NOTE — Plan of Care (Signed)

## 2020-09-21 ENCOUNTER — Inpatient Hospital Stay (HOSPITAL_COMMUNITY): Payer: Self-pay

## 2020-09-21 LAB — GLUCOSE, CAPILLARY
Glucose-Capillary: 102 mg/dL — ABNORMAL HIGH (ref 70–99)
Glucose-Capillary: 87 mg/dL (ref 70–99)
Glucose-Capillary: 115 mg/dL — ABNORMAL HIGH (ref 70–99)
Glucose-Capillary: >600 mg/dL (ref 70–99)
Glucose-Capillary: 134 mg/dL — ABNORMAL HIGH (ref 70–99)

## 2020-09-21 LAB — COMPREHENSIVE METABOLIC PANEL
ALT: 14 U/L (ref 0–44)
AST: 18 U/L (ref 15–41)
Albumin: 2.9 g/dL — ABNORMAL LOW (ref 3.5–5.0)
Alkaline Phosphatase: 49 U/L (ref 38–126)
Anion gap: 5 (ref 5–15)
BUN: <5 mg/dL — ABNORMAL LOW (ref 6–20)
CO2: 26 mmol/L (ref 22–32)
Calcium: 8.9 mg/dL (ref 8.9–10.3)
Chloride: 106 mmol/L (ref 98–111)
Creatinine, Ser: 0.57 mg/dL (ref 0.44–1.00)
GFR, Estimated: >60 mL/min (ref >60)
Glucose, Bld: 107 mg/dL — ABNORMAL HIGH (ref 70–99)
Potassium: 4.2 mmol/L (ref 3.5–5.1)
Sodium: 137 mmol/L (ref 135–145)
Total Bilirubin: 0.7 mg/dL (ref 0.3–1.2)
Total Protein: 5.8 g/dL — ABNORMAL LOW (ref 6.5–8.1)

## 2020-09-21 LAB — CBC
HCT: 36.5 % (ref 36.0–46.0)
Hemoglobin: 12.3 g/dL (ref 12.0–15.0)
MCH: 30.4 pg (ref 26.0–34.0)
MCHC: 33.7 g/dL (ref 30.0–36.0)
MCV: 90.1 fL (ref 80.0–100.0)
Platelets: 235 10*3/uL (ref 150–400)
RBC: 4.05 MIL/uL (ref 3.87–5.11)
RDW: 15.1 % (ref 11.5–15.5)
WBC: 10 10*3/uL (ref 4.0–10.5)
nRBC: 0 % (ref 0.0–0.2)

## 2020-09-21 MED ORDER — LEVALBUTEROL TARTRATE 45 MCG/ACT IN AERO
2.0000 | INHALATION_SPRAY | Freq: Four times a day (QID) | RESPIRATORY_TRACT | Status: DC
  Administered 2020-09-21 – 2020-09-22 (×3): 2 via RESPIRATORY_TRACT
  Filled 2020-09-21: qty 15

## 2020-09-21 MED ORDER — FUROSEMIDE 10 MG/ML IJ SOLN
20.0000 mg | Freq: Once | INTRAMUSCULAR | Status: AC
  Administered 2020-09-21: 20 mg via INTRAVENOUS
  Filled 2020-09-21: qty 2

## 2020-09-21 MED ORDER — LEVALBUTEROL TARTRATE 45 MCG/ACT IN AERO
2.0000 | INHALATION_SPRAY | Freq: Four times a day (QID) | RESPIRATORY_TRACT | Status: DC
  Filled 2020-09-21: qty 15

## 2020-09-21 MED ORDER — INSULIN ASPART 100 UNIT/ML ~~LOC~~ SOLN
10.0000 [IU] | Freq: Once | SUBCUTANEOUS | Status: AC
  Administered 2020-09-21: 10 [IU] via SUBCUTANEOUS

## 2020-09-21 NOTE — Progress Notes (Signed)
Spoke to [place], [place] about CT removal after pt ambulated x3 today. She thought it was best to remove the chest tube tomorrow morning. Pt had about 160 cc output from CT.

## 2020-09-21 NOTE — Progress Notes (Signed)
2 Days Post-Op Procedure(s) (LRB): XI ROBOTIC ASSISTED THORASCOPY-LOBECTOMY (Right) INTERCOSTAL NERVE BLOCK NODE DISSECTION (Right) Subjective: No complaints  Objective: Vital signs in last 24 hours: Temp:  [98.1 F (36.7 C)-98.7 F (37.1 C)] 98.1 F (36.7 C) (12/12 0400) Pulse Rate:  [64-66] 66 (12/12 0400) Cardiac Rhythm: Normal sinus rhythm (12/12 0400) Resp:  [13-18] 17 (12/12 0400) BP: (117-130)/(71-76) 130/71 (12/12 0400) SpO2:  [93 %-99 %] 97 % (12/12 0807)  Hemodynamic parameters for last 24 hours:    Intake/Output from previous day: 12/11 0701 - 12/12 0700 In: 506.8 [I.V.:506.8] Out: 1240 [Urine:1000; Chest Tube:240] Intake/Output this shift: No intake/output data recorded.  General appearance: alert and cooperative Neurologic: intact Heart: regular rate and rhythm, S1, S2 normal, no murmur, click, rub or gallop Lungs: rales RML Abdomen: soft, non-tender; bowel sounds normal; no masses,  no organomegaly Extremities: extremities normal, atraumatic, no cyanosis or edema Wound: c/d/i  Lab Results: Recent Labs    09/20/20 0048 09/21/20 0113  WBC 10.8* 10.0  HGB 12.4 12.3  HCT 39.1 36.5  PLT 245 235   BMET:  Recent Labs    09/20/20 0048 09/21/20 0113  NA 138 137  K 3.7 4.2  CL 109 106  CO2 22 26  GLUCOSE 188* 107*  BUN 7 <5*  CREATININE 0.67 0.57  CALCIUM 8.4* 8.9    PT/INR: No results for input(s): LABPROT, INR in the last 72 hours. ABG    Component Value Date/Time   PHART 7.413 09/17/2020 1201   HCO3 21.2 09/17/2020 1201   ACIDBASEDEF 2.7 (H) 09/17/2020 1201   O2SAT 96.0 09/17/2020 1201   CBG (last 3)  Recent Labs    09/20/20 1614 09/20/20 2107 09/21/20 0645  GLUCAP 108* 97 102*    Assessment/Plan: S/P Procedure(s) (LRB): XI ROBOTIC ASSISTED THORASCOPY-LOBECTOMY (Right) INTERCOSTAL NERVE BLOCK NODE DISSECTION (Right) Mobilize Diuresis chest tube out later today after walking   LOS: 2 days    Wonda Olds 09/21/2020

## 2020-09-21 NOTE — Progress Notes (Signed)
CRITICAL VALUE ALERT  Critical Value:  CBG >600 Date & Time Notied:  09/21/2020 2125  Provider Notified: Orvan Seen  Orders Received/Actions taken: Verbal order for 10 units of insulin and recheck cbg in 1 hr. Will continue to monitor

## 2020-09-21 NOTE — Plan of Care (Signed)

## 2020-09-22 ENCOUNTER — Inpatient Hospital Stay (HOSPITAL_COMMUNITY): Payer: Self-pay

## 2020-09-22 LAB — GLUCOSE, CAPILLARY
Glucose-Capillary: 98 mg/dL (ref 70–99)
Glucose-Capillary: 82 mg/dL (ref 70–99)
Glucose-Capillary: 86 mg/dL (ref 70–99)
Glucose-Capillary: 135 mg/dL — ABNORMAL HIGH (ref 70–99)

## 2020-09-22 MED ORDER — NAPROXEN 250 MG PO TABS
250.0000 mg | ORAL_TABLET | Freq: Two times a day (BID) | ORAL | Status: DC | PRN
  Filled 2020-09-22: qty 1

## 2020-09-22 MED ORDER — LEVALBUTEROL TARTRATE 45 MCG/ACT IN AERO
2.0000 | INHALATION_SPRAY | Freq: Four times a day (QID) | RESPIRATORY_TRACT | Status: DC | PRN
  Filled 2020-09-22: qty 15

## 2020-09-22 NOTE — Plan of Care (Signed)
  Problem: Education: Goal: Knowledge of General Education information will improve Description: Including pain rating scale, medication(s)/side effects and non-pharmacologic comfort measures Outcome: Progressing   Problem: Health Behavior/Discharge Planning: Goal: Ability to manage health-related needs will improve Outcome: Progressing   Problem: Clinical Measurements: Goal: Will remain free from infection Outcome: Progressing Goal: Diagnostic test results will improve Outcome: Progressing Goal: Cardiovascular complication will be avoided Outcome: Progressing   Problem: Coping: Goal: Level of anxiety will decrease Outcome: Progressing   Problem: Safety: Goal: Ability to remain free from injury will improve Outcome: Progressing

## 2020-09-22 NOTE — Plan of Care (Signed)
  Problem: Education: Goal: Knowledge of General Education information will improve Description: Including pain rating scale, medication(s)/side effects and non-pharmacologic comfort measures Outcome: Progressing   Problem: Health Behavior/Discharge Planning: Goal: Ability to manage health-related needs will improve Outcome: Progressing   Problem: Clinical Measurements: Goal: Ability to maintain clinical measurements within normal limits will improve Outcome: Progressing   Problem: Clinical Measurements: Goal: Respiratory complications will improve Outcome: Progressing   Problem: Nutrition: Goal: Adequate nutrition will be maintained Outcome: Progressing   Problem: Activity: Goal: Risk for activity intolerance will decrease Outcome: Progressing   Problem: Pain Managment: Goal: General experience of comfort will improve Outcome: Progressing   Problem: Skin Integrity: Goal: Risk for impaired skin integrity will decrease Outcome: Progressing   Problem: Clinical Measurements: Goal: Postoperative complications will be avoided or minimized Outcome: Progressing   Problem: Skin Integrity: Goal: Demonstration of wound healing without infection will improve Outcome: Progressing

## 2020-09-22 NOTE — Discharge Summary (Addendum)
Physician Discharge Summary  Patient ID: Stacie Kennedy MRN: [id number] DOB/AGE: [birth year] 59 y.o.  Admit date: 09/19/2020 Discharge date: 09/23/2020  Admission Diagnoses: Right upper lobe lung nodule  Patient Active Problem List   Diagnosis Date Noted  . Lung mass 08/28/2020  . Health care maintenance 05/22/2020  . Prediabetes 05/22/2020  . Elevated lipids 05/22/2020  . Stress incontinence of urine 05/22/2020  . Encounter to establish care 04/24/2020  . Urinary tract infection symptoms 04/24/2020  . Smoking 04/24/2020   Discharge Diagnoses: Non- small cell carcinoma right upper lobe - stage IA (T1N0)  Patient Active Problem List   Diagnosis Date Noted  . S/P lobectomy of lung 09/19/2020  . Lung mass 08/28/2020  . Health care maintenance 05/22/2020  . Prediabetes 05/22/2020  . Elevated lipids 05/22/2020  . Stress incontinence of urine 05/22/2020  . Encounter to establish care 04/24/2020  . Urinary tract infection symptoms 04/24/2020  . Smoking 04/24/2020   Discharged Condition: good  History of Present Illness:  Stacie Kennedy is a 59 yo female with history of Hypertension, Hyperlipidemia, prediabetes, and tobacco abuse of 1ppd x 43 years.  She recently underwent a low dose screening CT scan and was found to have a 1.5 cm right upper lobe nodule.  PET CT scan was performed and showed the nodule to be hypermetabolic with a SUV of 7.9.  There was no evidence of regional/distant or metastatic disease.  The patient was referred to Dr. Roxan Hockey for surgical evaluation.  She was evaluated on 08/28/2020 at which time she denied chest pain, pressure, and tightness.  She admitted to experiencing shortness of breath with heave exertion.  She denied weight loss and appetite changes.  It was felt the patient should undergo a surgical resection of the nodule for definitive diagnosis.  The risks and benefits of the procedure were explained to the patient and she was agreeable to  proceed.  Hospital Course:  Stacie Kennedy presented to [facility] on 09/19/2020.  She was taken to the operating room and underwent Robotic Assisted Thoracoscopy with Wedge Resection of Right Upper Lobe Lung Nodule, Completion Lobectomy of Right Upper Lobe, Lymph Node Dissection, and Intercostal Nerve Block.  She tolerated the procedure without difficulty, was extubated, and taken to the PACU in stable condition.  The patient had a small air leak post operatively.  Her CXR remained stable without significant pneumothorax.  Her CT output decreased over the next several days.  Her air leak resolved and her chest tube was removed on 09/22/2020.  Follow up CXR showed no evidence of pneumothorax and stable appearance of sub cutaneous emphysema which is mild. The patient is ambulating without difficulty.  Her surgical incisions are healing without evidence of infection.  She is medically stable for discharge home today.  Pathology remains pending.   Significant Diagnostic Studies: PET CT  1. 18 mm right upper lobe pulmonary nodule is hypermetabolic and consistent with primary lung neoplasm. 2. No findings for metastatic adenopathy or metastatic disease elsewhere. 3. Emphysematous changes and age advanced atherosclerotic disease.  Treatments: surgery:    Xi robotic-assisted right thoracoscopy, right upper lobe wedge resection, right upper lobectomy, mediastinal lymph node dissection, intercostal nerve block levels 3 through 10.  Discharge Exam: Blood pressure 122/77, pulse 67, temperature 97.7 F (36.5 C), temperature source Oral, resp. rate 20, height 5' 5.7" (1.669 m), weight 73 kg, SpO2 95 %.   General appearance: alert, cooperative and no distress Resp: coarse, clears with cough Cardio: regular rate and rhythm  GI: soft, non-tender; bowel sounds normal; no masses,  no organomegaly Extremities: extremities normal, atraumatic, no cyanosis or edema Incision/Wound:clean and dry   Discharge  disposition: 01-Home or Self Care    Allergies as of 09/23/2020   No Known Allergies     Medication List    TAKE these medications   acetaminophen 500 MG tablet Commonly known as: TYLENOL Take 2 tablets (1,000 mg total) by mouth every 6 (six) hours as needed.   naproxen sodium 220 MG tablet Commonly known as: ALEVE Take 220 mg by mouth 2 (two) times daily as needed (pain).   nicotine 14 mg/24hr patch Commonly known as: NICODERM CQ - dosed in mg/24 hours Place 1 patch (14 mg total) onto the skin daily.   oxyCODONE 5 MG immediate release tablet Commonly known as: Oxy IR/ROXICODONE Take 1 tablet (5 mg total) by mouth every 4 (four) hours as needed for moderate pain.   rosuvastatin 5 MG tablet Commonly known as: CRESTOR Take 1 tablet (5 mg total) by mouth daily.       Follow-up Information    Melrose Nakayama, MD Follow up on 10/14/2020.   Specialty: Cardiothoracic Surgery Why: Appointment is at 11:00, please get CXR at 10:30 at [facility] located on first floor of our office building Contact information: [place] [address] [phone]        [facility] Follow up on 10/02/2020.   Specialty: Cardiothoracic Surgery Why: Appointment is at 10:00 for suture removal with nurse Contact information: [place], [place] [phone]              Signed: Ellwood Handler, PA-C  09/23/2020, 10:14 AM

## 2020-09-22 NOTE — TOC Progression Note (Signed)
Transition of Care [facility]) - Progression Note    Patient Details  Name: Stacie Kennedy MRN: [id number] Date of Birth: Jun 18, 1961  Transition of Care [facility]) CM/SW Contact  Zenon Mayo, RN Phone Number: 09/22/2020, 12:11 PM  Clinical Narrative:    NCM spoke with patient concerning [place] services.  Patient thought she needed a HHRN for dressing, per Suezanne Jacquet Staff RN she does not need HHRN , it is just a dry guaze where chest tube drain was taken out. Patient states she would also like to talk about filing for disability,  NCM left vm for financial  Counselor to call patient or come by to see patient to discuss with her, she has no insurance.  This NCM will ast her with medications if needed at discharge tomorrow.         Expected Discharge Plan and Services                                                 Social Determinants of Health (SDOH) Interventions    Readmission Risk Interventions No flowsheet data found.

## 2020-09-22 NOTE — Progress Notes (Signed)
3 Days Post-Op Procedure(s) (LRB): XI ROBOTIC ASSISTED THORASCOPY-LOBECTOMY (Right) INTERCOSTAL NERVE BLOCK NODE DISSECTION (Right) Subjective: No complaints this AM  Objective: Vital signs in last 24 hours: Temp:  [97.8 F (36.6 C)-98.2 F (36.8 C)] 98 F (36.7 C) (12/13 0741) Pulse Rate:  [59-95] 95 (12/13 0741) Cardiac Rhythm: Normal sinus rhythm (12/13 0741) Resp:  [12-16] 12 (12/12 2312) BP: (113-118)/(71-74) 118/71 (12/12 2312) SpO2:  [95 %-98 %] 97 % (12/13 0741)  Hemodynamic parameters for last 24 hours:    Intake/Output from previous day: 12/12 0701 - 12/13 0700 In: 187.6 [P.O.:120; I.V.:67.6] Out: 2273 [Urine:2003; Chest Tube:270] Intake/Output this shift: No intake/output data recorded.  General appearance: alert, cooperative and no distress Neurologic: intact Heart: regular rate and rhythm Lungs: diminished breath sounds right base Wound: clean and dry no air leak  Lab Results: Recent Labs    09/20/20 0048 09/21/20 0113  WBC 10.8* 10.0  HGB 12.4 12.3  HCT 39.1 36.5  PLT 245 235   BMET:  Recent Labs    09/20/20 0048 09/21/20 0113  NA 138 137  K 3.7 4.2  CL 109 106  CO2 22 26  GLUCOSE 188* 107*  BUN 7 <5*  CREATININE 0.67 0.57  CALCIUM 8.4* 8.9    PT/INR: No results for input(s): LABPROT, INR in the last 72 hours. ABG    Component Value Date/Time   PHART 7.413 09/17/2020 1201   HCO3 21.2 09/17/2020 1201   ACIDBASEDEF 2.7 (H) 09/17/2020 1201   O2SAT 96.0 09/17/2020 1201   CBG (last 3)  Recent Labs    09/21/20 2118 09/21/20 2213 09/22/20 0602  GLUCAP >600* 134* 98    Assessment/Plan: S/P Procedure(s) (LRB): XI ROBOTIC ASSISTED THORASCOPY-LOBECTOMY (Right) INTERCOSTAL NERVE BLOCK NODE DISSECTION (Right) -No air leak- dc chest tube Continue ambulation Wean O2 Saline lock IV SCD + enoxaparin for DVT prophylaxis Probably home tomorrow   LOS: 3 days    Melrose Nakayama 09/22/2020

## 2020-09-23 ENCOUNTER — Inpatient Hospital Stay (HOSPITAL_COMMUNITY): Payer: Self-pay

## 2020-09-23 LAB — GLUCOSE, CAPILLARY: Glucose-Capillary: 108 mg/dL — ABNORMAL HIGH (ref 70–99)

## 2020-09-23 MED ORDER — OXYCODONE HCL 5 MG PO TABS: 5.0000 mg | ORAL_TABLET | ORAL | 0 refills | Status: DC | PRN

## 2020-09-23 MED ORDER — NICOTINE 14 MG/24HR TD PT24: 14.0000 mg | MEDICATED_PATCH | TRANSDERMAL | 0 refills | Status: DC

## 2020-09-23 MED ORDER — ACETAMINOPHEN 500 MG PO TABS: 1000.0000 mg | ORAL_TABLET | Freq: Four times a day (QID) | ORAL | 0 refills | Status: DC | PRN

## 2020-09-23 NOTE — Progress Notes (Signed)
SWOT nurse provided patient with verbal discharge instructions. Paper copy given to patient. VSS at discharge. IV's removed. Pt belongings sent with patient.   Pt discharged via wheelchair by SWOT nurse to private vehicle.

## 2020-09-23 NOTE — Progress Notes (Addendum)
° °   °  [address]       [place] [address]             [phone]      4 Days Post-Op Procedure(s) (LRB): XI ROBOTIC ASSISTED THORASCOPY-LOBECTOMY (Right) INTERCOSTAL NERVE BLOCK NODE DISSECTION (Right)   Subjective:  No new complaints.  Denies pain.  + ambulation  + BM  Objective: Vital signs in last 24 hours: Temp:  [97.8 F (36.6 C)-98.6 F (37 C)] 97.9 F (36.6 C) (12/14 0346) Pulse Rate:  [62-97] 67 (12/14 0346) Cardiac Rhythm: Normal sinus rhythm (12/14 0346) Resp:  [14-17] 14 (12/14 0346) BP: (103-134)/(65-85) 103/66 (12/14 0346) SpO2:  [91 %-97 %] 95 % (12/14 0346)  Intake/Output from previous day: 12/13 0701 - 12/14 0700 In: 240 [P.O.:240] Out: 50 [Chest Tube:50]  General appearance: alert, cooperative and no distress Heart: regular rate and rhythm Lungs: coarse clears with cough Abdomen: soft, non-tender; bowel sounds normal; no masses,  no organomegaly Extremities: extremities normal, atraumatic, no cyanosis or edema Wound: clean and dry  Lab Results: Recent Labs    09/21/20 0113  WBC 10.0  HGB 12.3  HCT 36.5  PLT 235   BMET:  Recent Labs    09/21/20 0113  NA 137  K 4.2  CL 106  CO2 26  GLUCOSE 107*  BUN <5*  CREATININE 0.57  CALCIUM 8.9    PT/INR: No results for input(s): LABPROT, INR in the last 72 hours. ABG    Component Value Date/Time   PHART 7.413 09/17/2020 1201   HCO3 21.2 09/17/2020 1201   ACIDBASEDEF 2.7 (H) 09/17/2020 1201   O2SAT 96.0 09/17/2020 1201   CBG (last 3)  Recent Labs    09/22/20 1531 09/22/20 2147 09/23/20 0623  GLUCAP 86 135* 108*    Assessment/Plan: S/P Procedure(s) (LRB): XI ROBOTIC ASSISTED THORASCOPY-LOBECTOMY (Right) INTERCOSTAL NERVE BLOCK NODE DISSECTION (Right)  1. CV- hemodynamically stable in NSR 2. Pulm- CXR w/o pneumothorax, stable appearance of right sided sub cutaneous emphysema 3. Dispo- patient stable, d/c home today   LOS: 4 days    Ellwood Handler,  PA-C 09/23/2020 Patient seen and examined, agree with above Final path still pending Dc home  [place]. Roxan Hockey, MD [facility] [phone]

## 2020-09-23 NOTE — TOC Transition Note (Signed)
Transition of Care [facility]) - CM/SW Discharge Note   Patient Details  Name: Stacie Kennedy MRN: [id number] Date of Birth: Mar 11, 1961  [facility]) CM/SW Contact:  Zenon Mayo, RN Phone Number: 09/23/2020, 9:02 AM   Clinical Narrative:    Patient is for discharge today, she will not need assistance with meds, the only med prescribe is oxy and the [employer] does not cover narcotics.  She has transportation at [employer]. [facility] [place]   Final next level of care: Home/Self Care Barriers to Discharge: No Barriers Identified   Patient Goals and CMS Choice        Discharge Placement                       Discharge Plan and Services                  DME Agency: NA       HH Arranged: NA          Social Determinants of Health (SDOH) Interventions     Readmission Risk Interventions No flowsheet data found.

## 2020-09-25 LAB — TYPE AND SCREEN
ABO/RH(D): O POS
Antibody Screen: NEGATIVE
Unit division: 0
Unit division: 0

## 2020-09-25 LAB — SURGICAL PATHOLOGY

## 2020-09-25 LAB — BPAM RBC
Blood Product Expiration Date: 202201132359
Blood Product Expiration Date: 202201132359
Unit Type and Rh: 5100
Unit Type and Rh: 5100

## 2020-10-02 ENCOUNTER — Ambulatory Visit (INDEPENDENT_AMBULATORY_CARE_PROVIDER_SITE_OTHER): Payer: Self-pay

## 2020-10-02 ENCOUNTER — Other Ambulatory Visit: Payer: Self-pay

## 2020-10-02 ENCOUNTER — Other Ambulatory Visit: Payer: Self-pay | Admitting: Physician Assistant

## 2020-10-02 DIAGNOSIS — Z4802 Encounter for removal of sutures: Secondary | ICD-10-CM

## 2020-10-02 MED ORDER — OXYCODONE HCL 5 MG PO TABS: 5.0000 mg | ORAL_TABLET | Freq: Four times a day (QID) | ORAL | 0 refills | Status: DC | PRN

## 2020-10-02 NOTE — Progress Notes (Signed)
Patient arrived for nurse visit to remove 1 suture post- procedure Right RATS/ lobectomy with Dr. Skipper Cliche 09/19/20.  Suture removed with no signs/ symptoms of infection noted.  Patient tolerated procedure well.  Patient instructed to keep the incision sites clean and dry.  Patient acknowledged instructions given.   Patient also requested refill of pain medication.  Will refer to PA on call for possible refill. Patient aware.

## 2020-10-02 NOTE — Progress Notes (Signed)
Notified by Clinic nurse that patient requested a refill on her pain medication.  She is S/p Robotic Assisted Thorascopic lobectomy.  She presented to office today for a suture removal.    She will be given a prescription for Oxycodone 5 mg tablet every 6 hours as needed for pain, Disp #30 with no refills.   Ellwood Handler, PA-C

## 2020-10-09 ENCOUNTER — Other Ambulatory Visit: Payer: Self-pay | Admitting: *Deleted

## 2020-10-09 NOTE — Progress Notes (Signed)
The proposed treatment discussed in cancer conference 12/30 is for discussion purpose only and is not a binding recommendation. The patient was not physically examined nor present for their treatment options. Therefore, final treatment plans cannot be decided.

## 2020-10-13 ENCOUNTER — Other Ambulatory Visit: Payer: Self-pay | Admitting: Thoracic Surgery (Cardiothoracic Vascular Surgery)

## 2020-10-13 DIAGNOSIS — R918 Other nonspecific abnormal finding of lung field: Secondary | ICD-10-CM

## 2020-10-14 ENCOUNTER — Other Ambulatory Visit: Payer: Self-pay

## 2020-10-14 ENCOUNTER — Ambulatory Visit
Admission: RE | Admit: 2020-10-14 | Discharge: 2020-10-14 | Disposition: A | Discharge status: Home / Self Care | Payer: Self-pay | Source: Ambulatory Visit | Attending: Thoracic Surgery (Cardiothoracic Vascular Surgery) | Admitting: Thoracic Surgery (Cardiothoracic Vascular Surgery)

## 2020-10-14 ENCOUNTER — Ambulatory Visit (INDEPENDENT_AMBULATORY_CARE_PROVIDER_SITE_OTHER): Payer: Self-pay | Admitting: Thoracic Surgery (Cardiothoracic Vascular Surgery)

## 2020-10-14 ENCOUNTER — Encounter: Payer: Self-pay | Admitting: Thoracic Surgery (Cardiothoracic Vascular Surgery)

## 2020-10-14 VITALS — BP 134/83 | HR 71 | Temp 97.7°F | Resp 20 | Ht 67.5 in | Wt 158.0 lb

## 2020-10-14 DIAGNOSIS — C3411 Malignant neoplasm of upper lobe, right bronchus or lung: Secondary | ICD-10-CM

## 2020-10-14 DIAGNOSIS — Z09 Encounter for follow-up examination after completed treatment for conditions other than malignant neoplasm: Secondary | ICD-10-CM

## 2020-10-14 DIAGNOSIS — R918 Other nonspecific abnormal finding of lung field: Secondary | ICD-10-CM

## 2020-10-14 NOTE — Progress Notes (Signed)
[address]       [place] [address]             [phone]     HPI: Ms. Savarino returns for a scheduled follow-up visit following a right upper lobectomy  Deneice Wack is a 60 year old woman with history of tobacco abuse, lung cancer, hypertension, hyperlipidemia, and prediabetes.  She has a 40-pack-year history of smoking.  She had a low-dose CT for lung cancer screening which showed a 1.5 cm right upper lobe nodule.  On PET CT the nodule was hypermetabolic with an SUV of 7.9.  She underwent a robotic right upper lobectomy on 09/19/2020.  A wedge resection confirmed that it was a non-small cell carcinoma.  We then did the lobectomy and node dissection.  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  Postoperatively she did well and went home on day 4.  She does have some incisional pain.  She is taking Aleve and acetaminophen for that.  She is occasionally using oxycodone as well.  She does feel a fullness or bulge in her right upper quadrant.  Past Medical History:  Diagnosis Date  . Cancer ([place])    lung cancer  . High cholesterol   . Pre-diabetes   . Umbilical hernia     Current Outpatient Medications  Medication Sig Dispense Refill  . acetaminophen (TYLENOL) 500 MG tablet Take 2 tablets (1,000 mg total) by mouth every 6 (six) hours as needed. 30 tablet 0  . naproxen sodium (ALEVE) 220 MG tablet Take 220 mg by mouth 2 (two) times daily as needed (pain).    . nicotine (NICODERM CQ - DOSED IN MG/24 HOURS) 14 mg/24hr patch Place 1 patch (14 mg total) onto the skin daily. 30 patch 0  . oxyCODONE (OXY IR/ROXICODONE) 5 MG immediate release tablet Take 1 tablet (5 mg total) by mouth every 6 (six) hours as needed for moderate pain. 30 tablet 0  . rosuvastatin (CRESTOR) 5 MG tablet Take 1 tablet (5 mg total) by mouth daily. 30 tablet 0   No current facility-administered medications for this visit.    Physical Exam BP 134/83    Pulse 71   Temp 97.7 F (36.5 C) (Skin)   Resp 20   Ht 5' 7.5" (1.715 m)   Wt 158 lb (71.7 kg)   SpO2 90% Comment: RA  BMI 24.64 kg/m  60 year old woman in no acute distress Alert and oriented x3 with no focal deficits Incisions clean dry and intact Lungs diminished at right base but otherwise clear Cardiac regular rate and rhythm No peripheral edema  Diagnostic Tests: I reviewed the chest x-ray done today.  It shows continued evolution of postoperative changes.  Improved from 09/23/2020.  Impression: Magalie Almon is a 60 year old woman with history of tobacco abuse, lung cancer, hypertension, hyperlipidemia, and prediabetes.  She was found to have a right upper lobe nodule on a low-dose screening CT.  That nodule was hypermetabolic on PET/CT.  I did a robotic right upper lobectomy on 09/19/2020.  The nodule turned out to be a stage Ia mixed large cell neuroendocrine and adenocarcinoma.  12 lymph nodes were negative.  Postoperatively she is doing well.  She does have some incisional discomfort.  She is weaning herself off narcotics and is rarely taking those anymore.  She may begin to resume her normal activities.  She may drive.  She works as a Microbiologist.  She is on her  feet a lot.  I recommended that she plan to go back to work 1 February.  She needs to follow-up with oncology at [facility].  Plan:  Follow-up with Leafy Kindle, NP at the [facility] oncology center Return in 1 month to check on progress  Melrose Nakayama, MD [facility] [phone]

## 2020-10-16 ENCOUNTER — Ambulatory Visit: Payer: Self-pay

## 2020-10-28 ENCOUNTER — Telehealth: Payer: Self-pay | Admitting: Gerontology

## 2020-10-28 ENCOUNTER — Other Ambulatory Visit: Payer: Self-pay

## 2020-10-28 ENCOUNTER — Encounter: Payer: Self-pay | Admitting: Pharmacist

## 2020-10-28 ENCOUNTER — Ambulatory Visit: Payer: Self-pay | Admitting: Pharmacist

## 2020-10-28 ENCOUNTER — Other Ambulatory Visit: Payer: Self-pay | Admitting: Gerontology

## 2020-10-28 DIAGNOSIS — Z902 Acquired absence of lung [part of]: Secondary | ICD-10-CM

## 2020-10-28 DIAGNOSIS — E785 Hyperlipidemia, unspecified: Secondary | ICD-10-CM

## 2020-10-28 DIAGNOSIS — R7303 Prediabetes: Secondary | ICD-10-CM

## 2020-10-28 DIAGNOSIS — Z79899 Other long term (current) drug therapy: Secondary | ICD-10-CM

## 2020-10-28 MED ORDER — ROSUVASTATIN CALCIUM 10 MG PO TABS: 10.0000 mg | ORAL_TABLET | Freq: Every day | ORAL | 2 refills | Status: DC

## 2020-10-28 NOTE — Progress Notes (Signed)
Medication Management Clinic Visit Note  Patient: Stacie Kennedy MRN: [id number] Date of Birth: 1961/10/11 PCP: Default, Provider, MD   [name] 60 y.o. female presents for a telephone visit for medication management today. Verified patient with two identifiers.   There were no vitals taken for this visit.  Patient Information   Past Medical History:  Diagnosis Date  . Cancer ([place])    lung cancer  . High cholesterol   . Pre-diabetes   . Umbilical hernia       Past Surgical History:  Procedure Laterality Date  . INTERCOSTAL NERVE BLOCK  09/19/2020   Procedure: INTERCOSTAL NERVE BLOCK;  Surgeon: Melrose Nakayama, MD;  Location: [place];  Service: Thoracic;;  . LUNG REMOVAL, PARTIAL Right 09/19/2020  . NODE DISSECTION Right 09/19/2020   Procedure: NODE DISSECTION;  Surgeon: Melrose Nakayama, MD;  Location: [place];  Service: Thoracic;  Laterality: Right;    History reviewed. No pertinent family history.  New Diagnoses (since last visit):  Pt had partial R lung removed in 09/2020  Family Support: Good  Lifestyle Diet: Breakfast: oatmeal, eggs Lunch: skips - not usually hungry; maybe a banana sometimes Dinner: chicken and potatoes  Drinks: water, hot tea    Current Exercise Habits: The patient does not participate in regular exercise at present  Exercise limited by: respiratory conditions(s)    Social History   Substance and Sexual Activity  Alcohol Use Yes   Comment: 1 qt. beer per week      Social History   Tobacco Use  Smoking Status Current Every Day Smoker  . Packs/day: 0.10  . Years: 43.00  . Pack years: 4.30  . Types: Cigarettes  Smokeless Tobacco Never Used  Tobacco Comment   states she is slowing, trying to quit      Health Maintenance  Topic Date Due  . Hepatitis C Screening  Never done  . HIV Screening  Never done  . TETANUS/TDAP  Never done  . PAP SMEAR-Modifier  Never done  . COLONOSCOPY (Pts 45-91yrs Insurance coverage will  need to be confirmed)  Never done  . MAMMOGRAM  Never done  . COVID-19 Vaccine (3 - Moderna risk 4-dose series) 05/06/2020  . INFLUENZA VACCINE  Never done   Health Maintenance/Date Completed  Last ED visit: 09/19/2020 Specialist visit: 11/03/2020  Dental Exam: 07/2020 Eye Exam: unknown Pelvic/PAP Exam: 09/2015 Mammogram: 09/2015 Colonoscopy: had appt but was unable to do due to surgeries  Flu Vaccine: due, pt does not want vaccines Pneumonia Vaccine: due, pt does not want vaccines COVID-19 Vaccine: due, pt does not want vaccines Shingrix Vaccine: due, pt does not want vaccines  Outpatient Encounter Medications as of 10/28/2020  Medication Sig  . acetaminophen (TYLENOL) 500 MG tablet Take 2 tablets (1,000 mg total) by mouth every 6 (six) hours as needed.  . naproxen sodium (ALEVE) 220 MG tablet Take 220 mg by mouth 2 (two) times daily as needed (pain).  . rosuvastatin (CRESTOR) 5 MG tablet Take 1 tablet (5 mg total) by mouth daily.  . nicotine (NICODERM CQ - DOSED IN MG/24 HOURS) 14 mg/24hr patch Place 1 patch (14 mg total) onto the skin daily. (Patient not taking: Reported on 10/28/2020)  . [DISCONTINUED] oxyCODONE (OXY IR/ROXICODONE) 5 MG immediate release tablet Take 1 tablet (5 mg total) by mouth every 6 (six) hours as needed for moderate pain.   No facility-administered encounter medications on file as of 10/28/2020.     Assessment and Plan: HLD Pt takes crestor  for HLD. Last LDL 08/27/2020 151 which did not improve much from 05/14/2020 (155). Consider increasing dose of crestor - will message Dr. Benjamine Mola.  Tobacco use Pt currently smokes 0.1ppd which has decreased from last time. She did have nicotine patches on her med list, however pt states she is not using these. She is still working on quitting.  Access/Adherence Pt requested refill for crestor, however, will message Dr. Benjamine Mola first about increasing dose before sending request for current rx (5mg  daily).     Sherilyn Banker, PharmD Pharmacy Resident  10/28/2020 1:31 PM

## 2020-10-28 NOTE — Patient Instructions (Signed)

## 2020-10-28 NOTE — Progress Notes (Signed)
Established Patient Office Visit  Subjective:  Patient ID: Stacie Kennedy, female    DOB: 11-20-1960  Age: 60 y.o. MRN: [id number]  CC: No chief complaint on file. Patient consents to telephone visit and 2 patient identifiers was used to identify patient.  HPI Stacie Kennedy presents for follow up of hyperlipidemia, pre diabetes and medication refill. She states that she's compliant with her medication and continues to make heathy lifestyle changes. Per Dr Sloan Leiter note, She had a low-dose CT for lung cancer screening which showed a 1.5 cm right upper lobe nodule.  On PET CT the nodule was hypermetabolic with an SUV of 7.9.  She underwent a robotic right upper lobectomy on 09/19/2020.  A wedge resection confirmed that it was a non-small cell carcinoma.  We then did the lobectomy and node dissection.  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 She was seen for post op visit by Dr Sloan Leiter on 10/14/20. Currently, she states that she doing well, continues to experience intermittent shortness of breath with exertion. She states that she smokes 2 cigarette daily and admits the desire to quit. She denies chest pain, palpitation, light headedness and offers no further complaint.     Past Medical History:  Diagnosis Date  . Cancer ([place])    lung cancer  . High cholesterol   . Pre-diabetes   . Umbilical hernia     Past Surgical History:  Procedure Laterality Date  . INTERCOSTAL NERVE BLOCK  09/19/2020   Procedure: INTERCOSTAL NERVE BLOCK;  Surgeon: Melrose Nakayama, MD;  Location: [place];  Service: Thoracic;;  . LUNG REMOVAL, PARTIAL Right 09/19/2020  . NODE DISSECTION Right 09/19/2020   Procedure: NODE DISSECTION;  Surgeon: Melrose Nakayama, MD;  Location: [place];  Service: Thoracic;  Laterality: Right;    No family history on file.  Social History   Socioeconomic History  . Marital status: Single    Spouse name: Not on file  .  Number of children: Not on file  . Years of education: Not on file  . Highest education level: Not on file  Occupational History  . Not on file  Tobacco Use  . Smoking status: Current Every Day Smoker    Packs/day: 0.10    Years: 43.00    Pack years: 4.30    Types: Cigarettes  . Smokeless tobacco: Never Used  . Tobacco comment: states she is slowing, trying to quit  Vaping Use  . Vaping Use: Never used  Substance and Sexual Activity  . Alcohol use: Yes    Comment: 1 qt. beer per week  . Drug use: No  . Sexual activity: Not on file  Other Topics Concern  . Not on file  Social History Narrative  . Not on file   Social Determinants of Health   Financial Resource Strain: High Risk  . Difficulty of Paying Living Expenses: Hard  Food Insecurity: Food Insecurity Present  . Worried About Charity fundraiser in the Last Year: Sometimes true  . Ran Out of Food in the Last Year: Sometimes true  Transportation Needs: No Transportation Needs  . Lack of Transportation (Medical): No  . Lack of Transportation (Non-Medical): No  Physical Activity: Sufficiently Active  . Days of Exercise per Week: 5 days  . Minutes of Exercise per Session: 60 min  Stress: Stress Concern Present  . Feeling of Stress : Very much  Social Connections: Socially Isolated  . Frequency of  Communication with Friends and Family: Never  . Frequency of Social Gatherings with Friends and Family: Never  . Attends Religious Services: Never  . Active Member of Clubs or Organizations: No  . Attends Archivist Meetings: Never  . Marital Status: Never married  Intimate Partner Violence: Not At Risk  . Fear of Current or Ex-Partner: No  . Emotionally Abused: No  . Physically Abused: No  . Sexually Abused: No    Outpatient Medications Prior to Visit  Medication Sig Dispense Refill  . acetaminophen (TYLENOL) 500 MG tablet Take 2 tablets (1,000 mg total) by mouth every 6 (six) hours as needed. 30 tablet 0   . naproxen sodium (ALEVE) 220 MG tablet Take 220 mg by mouth 2 (two) times daily as needed (pain).    . nicotine (NICODERM CQ - DOSED IN MG/24 HOURS) 14 mg/24hr patch Place 1 patch (14 mg total) onto the skin daily. (Patient not taking: Reported on 10/28/2020) 30 patch 0  . rosuvastatin (CRESTOR) 5 MG tablet Take 1 tablet (5 mg total) by mouth daily. 30 tablet 0   No facility-administered medications prior to visit.    No Known Allergies  ROS Review of Systems  Constitutional: Negative.   Respiratory: Positive for shortness of breath (mild with exertion).   Cardiovascular: Negative.   Neurological: Negative.   Psychiatric/Behavioral: Negative.       Objective:    Physical Exam No physical exam done There were no vitals taken for this visit. Wt Readings from Last 3 Encounters:  10/14/20 158 lb (71.7 kg)  09/19/20 160 lb 15 oz (73 kg)  09/17/20 160 lb 14.4 oz (73 kg)     Health Maintenance Due  Topic Date Due  . Hepatitis C Screening  Never done  . HIV Screening  Never done  . TETANUS/TDAP  Never done  . PAP SMEAR-Modifier  Never done  . COLONOSCOPY (Pts 45-69yrs Insurance coverage will need to be confirmed)  Never done  . MAMMOGRAM  Never done  . COVID-19 Vaccine (3 - Moderna risk 4-dose series) 05/06/2020  . INFLUENZA VACCINE  Never done    There are no preventive care reminders to display for this patient.  No results found for: TSH Lab Results  Component Value Date   WBC 10.0 09/21/2020   HGB 12.3 09/21/2020   HCT 36.5 09/21/2020   MCV 90.1 09/21/2020   PLT 235 09/21/2020   Lab Results  Component Value Date   NA 137 09/21/2020   K 4.2 09/21/2020   CO2 26 09/21/2020   GLUCOSE 107 (H) 09/21/2020   BUN <5 (L) 09/21/2020   CREATININE 0.57 09/21/2020   BILITOT 0.7 09/21/2020   ALKPHOS 49 09/21/2020   AST 18 09/21/2020   ALT 14 09/21/2020   PROT 5.8 (L) 09/21/2020   ALBUMIN 2.9 (L) 09/21/2020   CALCIUM 8.9 09/21/2020   ANIONGAP 5 09/21/2020   Lab  Results  Component Value Date   CHOL 220 (H) 08/27/2020   Lab Results  Component Value Date   HDL 38 (L) 08/27/2020   Lab Results  Component Value Date   LDLCALC 151 (H) 08/27/2020   Lab Results  Component Value Date   TRIG 170 (H) 08/27/2020   Lab Results  Component Value Date   CHOLHDL 5.8 (H) 08/27/2020   Lab Results  Component Value Date   HGBA1C 6.2 (H) 08/27/2020      Assessment & Plan:    1. Elevated lipids - Her LDL was 151 mg/dl,  she is tolerating Crestor, it will be increased to 10 mg daily. She was advised to continue on low fat/cholesterol diet. - rosuvastatin (CRESTOR) 10 MG tablet; Take 1 tablet (10 mg total) by mouth daily.  Dispense: 30 tablet; Refill: 2 - Lipid panel; Future  2. Prediabetes -Her last HgbA1c was 6.2%, will recheck - HgB A1c; Future. She was advised to continue on low carb/non concentrated sweet diet.  3. S/P lobectomy of lung - She will continue to follow up with Dr Roxan Hockey, and to go to the ED for worsening shortness of breath. She was strongly encouraged on smoking cessation and [place] Quitline information will be mailed to her.    Follow-up: Return in about 5 weeks (around 12/04/2020), or if symptoms worsen or fail to improve.    Kripa Foskey Jerold Coombe, NP

## 2020-11-03 ENCOUNTER — Inpatient Hospital Stay: Payer: Self-pay | Attending: Internal Medicine | Admitting: Internal Medicine

## 2020-11-03 ENCOUNTER — Encounter: Payer: Self-pay | Admitting: Internal Medicine

## 2020-11-03 ENCOUNTER — Encounter (INDEPENDENT_AMBULATORY_CARE_PROVIDER_SITE_OTHER): Payer: Self-pay

## 2020-11-03 ENCOUNTER — Other Ambulatory Visit: Payer: Self-pay | Admitting: Thoracic Surgery (Cardiothoracic Vascular Surgery)

## 2020-11-03 ENCOUNTER — Inpatient Hospital Stay: Payer: Self-pay

## 2020-11-03 ENCOUNTER — Encounter: Payer: Self-pay | Admitting: *Deleted

## 2020-11-03 DIAGNOSIS — Z902 Acquired absence of lung [part of]: Secondary | ICD-10-CM

## 2020-11-03 DIAGNOSIS — K429 Umbilical hernia without obstruction or gangrene: Secondary | ICD-10-CM | POA: Insufficient documentation

## 2020-11-03 DIAGNOSIS — E7800 Pure hypercholesterolemia, unspecified: Secondary | ICD-10-CM | POA: Insufficient documentation

## 2020-11-03 DIAGNOSIS — C3411 Malignant neoplasm of upper lobe, right bronchus or lung: Secondary | ICD-10-CM | POA: Insufficient documentation

## 2020-11-03 DIAGNOSIS — F17210 Nicotine dependence, cigarettes, uncomplicated: Secondary | ICD-10-CM | POA: Insufficient documentation

## 2020-11-03 DIAGNOSIS — Z79899 Other long term (current) drug therapy: Secondary | ICD-10-CM | POA: Insufficient documentation

## 2020-11-03 NOTE — Progress Notes (Signed)
[place] NOTE  Patient Care Team: Default, Provider, MD as PCP - General Telford Nab, RN as Oncology Nurse Navigator  CHIEF COMPLAINTS/PURPOSE OF CONSULTATION: lung cancer  #  Oncology History Overview Note  # NOV -[id number] [facility] CANCER SCREENING PROGRAM]-18 mm right upper lobe lung nodule; DEC 2021- s/p right upper lobectomy [Dr. Roxan Hockey; [place]]; STAGE: I [pT-18 mm; LN-12=0]; predominant large cell neuroendocrine; minor adenocarcinoma.  Declines adjuvant chemotherapy.  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  Visceral Pleura Invasion: Not identified  Direct Invasion of Adjacent Structures: No adjacent structures present  Lymphovascular Invasion: Not identified  Margins: All margins negative for invasive carcinoma       Closest Margin(s) to Invasive Carcinoma: Bronchovascular margin  Treatment Effect: No known presurgical therapy  Regional Lymph Nodes:       Number of Lymph Nodes Involved: 0                            Nodal Sites with Tumor: Not applicable       Number of Lymph Nodes Examined: 12    Primary cancer of right upper lobe of lung ([place])  11/03/2020 Initial Diagnosis   Primary cancer of right upper lobe of lung ([place])   11/03/2020 Cancer Staging   Staging form: Lung, AJCC 8th Edition - Pathologic: Stage IA3 (pT1c, pN0, cM0) - Signed by Cammie Sickle, MD on 11/04/2020      HISTORY OF PRESENTING ILLNESS:  Stacie Kennedy 60 y.o.  female history of smoking is here to review the treatment options for newly diagnosed lung cancer.  Patient had a CT scan as part of  lung cancer screening program.  This led to a CT scan that showed 18 mm right upper lobe peripheral nodule.  This led to a PET scan which was consistent with solitary nodule no mediastinal disease.  Patient was subsequently evaluated by thoracic surgery.  Patient underwent right upper lobectomy.  Postoperative course uneventful.  Patient unfortunately continues smoke 2 to 3 cigarettes a day.  Currently denies any chest pain.  Denies any shortness of breath.  Review of Systems  Constitutional: Negative for chills, diaphoresis, fever, malaise/fatigue and weight loss.  HENT: Negative for nosebleeds and sore throat.   Eyes: Negative for double vision.  Respiratory: Negative for cough, hemoptysis, sputum production, shortness of breath and wheezing.   Cardiovascular: Negative for chest pain, palpitations, orthopnea and leg swelling.  Gastrointestinal: Negative for abdominal pain, blood in stool, constipation, diarrhea, heartburn, melena, nausea and vomiting.  Genitourinary: Negative for dysuria, frequency and urgency.  Musculoskeletal: Positive for back pain. Negative for joint pain.  Skin: Negative.  Negative for itching and rash.  Neurological: Negative for dizziness, tingling, focal weakness, weakness and headaches.  Endo/Heme/Allergies: Does not bruise/bleed easily.  Psychiatric/Behavioral: Negative for depression. The patient is not nervous/anxious and does not have insomnia.      MEDICAL HISTORY:  Past Medical History:  Diagnosis Date  . Cancer ([place])    lung cancer  . High cholesterol   .  Pre-diabetes   . Umbilical hernia     SURGICAL HISTORY: Past Surgical History:  Procedure Laterality Date  . INTERCOSTAL NERVE BLOCK  09/19/2020   Procedure: INTERCOSTAL NERVE BLOCK;  Surgeon: Melrose Nakayama, MD;  Location: [place];  Service: Thoracic;;  . LUNG REMOVAL, PARTIAL Right 09/19/2020  . NODE DISSECTION Right 09/19/2020   Procedure: NODE DISSECTION;  Surgeon: Melrose Nakayama, MD;  Location: [place];  Service: Thoracic;  Laterality: Right;    SOCIAL HISTORY: Social History   Socioeconomic History  . Marital status: Single    Spouse name: Not on file  . Number of children: Not on file  . Years of education: Not on file  . Highest education level: Not on file  Occupational History  . Not on file  Tobacco Use  . Smoking status: Current Every Day Smoker    Packs/day: 0.10    Years: 43.00    Pack years: 4.30    Types: Cigarettes  . Smokeless tobacco: Never Used  . Tobacco comment: states she is slowing, trying to quit  Vaping Use  . Vaping Use: Never used  Substance and Sexual Activity  . Alcohol use: Yes    Comment: 1 qt. beer per week  . Drug use: No  . Sexual activity: Not on file  Other Topics Concern  . Not on file  Social History Narrative   Lives in [place]; smokes; now and then beer; with boy friend. 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    Social Determinants of Health   Financial Resource Strain: High Risk  . Difficulty of Paying Living Expenses: Hard  Food Insecurity: Food Insecurity Present  . Worried About Charity fundraiser in the Last Year: Sometimes true  . Ran Out of Food in the Last Year: Sometimes true  Transportation Needs: No Transportation Needs  . Lack of Transportation (Medical): No  . Lack of Transportation (Non-Medical): No  Physical Activity: Sufficiently Active  . Days of Exercise per Week: 5 days  . Minutes of Exercise per Session: 60 min  Stress: Stress Concern Present  . Feeling of Stress : Very much  Social Connections: Socially Isolated  . Frequency of Communication with Friends and Family: Never  . Frequency of Social Gatherings with Friends and Family: Never  . Attends Religious Services: Never  . Active Member of Clubs or Organizations: No  . Attends Archivist Meetings: Never  . Marital Status: Never married  Intimate Partner Violence: Not At Risk  . Fear of Current or  Ex-Partner: No  . Emotionally Abused: No  . Physically Abused: No  . Sexually Abused: No    FAMILY HISTORY: History reviewed. No pertinent family history.  ALLERGIES:  has No Known Allergies.  MEDICATIONS:  Current Outpatient Medications  Medication Sig Dispense Refill  . acetaminophen (TYLENOL) 500 MG tablet Take 2 tablets (1,000 mg total) by mouth every 6 (six) hours as needed. 30 tablet 0  . naproxen sodium (ALEVE) 220 MG tablet Take 220 mg by mouth 2 (two) times daily as needed (pain).    . rosuvastatin (CRESTOR) 10 MG tablet Take 1 tablet (10 mg total) by mouth daily. 30 tablet 2   No current facility-administered medications for this visit.      Marland Kitchen  PHYSICAL EXAMINATION: ECOG PERFORMANCE STATUS: 0 - Asymptomatic  Vitals:   11/03/20 1337  BP: (!) 120/91  Pulse: 88  Resp: 16  Temp: 98.3 F (36.8 C)  SpO2: 95%   Filed Weights  11/03/20 1337  Weight: 158 lb 9.6 oz (71.9 kg)    Physical Exam Constitutional:      Comments: Alone; ambulating independently.   HENT:     Head: Normocephalic and atraumatic.     Mouth/Throat:     Mouth: Oropharynx is clear and moist.     Pharynx: No oropharyngeal exudate.  Eyes:     Pupils: Pupils are equal, round, and reactive to light.  Cardiovascular:     Rate and Rhythm: Normal rate and regular rhythm.  Pulmonary:     Effort: Pulmonary effort is normal. No respiratory distress.     Breath sounds: Normal breath sounds. No wheezing.  Abdominal:     General: Bowel sounds are normal. There is no distension.     Palpations: Abdomen is soft. There is no mass.     Tenderness: There is no abdominal tenderness. There is no guarding or rebound.  Musculoskeletal:        General: No tenderness or edema. Normal range of motion.     Cervical back: Normal range of motion and neck supple.  Skin:    General: Skin is warm.  Neurological:     Mental Status: She is alert and oriented to person, place, and time.  Psychiatric:        Mood  and Affect: Affect normal.      LABORATORY DATA:  I have reviewed the data as listed Lab Results  Component Value Date   WBC 10.0 09/21/2020   HGB 12.3 09/21/2020   HCT 36.5 09/21/2020   MCV 90.1 09/21/2020   PLT 235 09/21/2020   Recent Labs    05/14/20 1216 05/14/20 1216 07/27/20 0922 09/17/20 1143 09/20/20 0048 09/21/20 0113  NA 139   < > 135 138 138 137  K 4.3  --  3.7 4.2 3.7 4.2  CL 103  --  100 108 109 106  CO2 23  --  24 19* 22 26  GLUCOSE 90   < > 116* 95 188* 107*  BUN 9   < > 10 14 7  <5*  CREATININE 0.59  --  0.64 0.67 0.67 0.57  CALCIUM 9.6  --  9.5 9.5 8.4* 8.9  GFRNONAA 101   < > >60 >60 >60 >60  GFRAA 117  --   --   --   --   --   PROT 7.0  --  7.9 7.0  --  5.8*  ALBUMIN 4.2  --  4.2 3.7  --  2.9*  AST 14  --  15 17  --  18  ALT 8  --  9 13  --  14  ALKPHOS 99  --  90 93  --  49  BILITOT 0.3  --  0.8 0.4  --  0.7   < > = values in this interval not displayed.    RADIOGRAPHIC STUDIES: I have personally reviewed the radiological images as listed and agreed with the findings in the report. DG Chest 2 View  Result Date: 10/14/2020 CLINICAL DATA:  Recent chest surgery for pulmonary nodule. Tobacco use. EXAM: CHEST - 2 VIEW COMPARISON:  September 23, 2020. FINDINGS: There has been resolution of subcutaneous air compared to the previous study. There is postoperative change on the right with scarring and volume loss. No pulmonary nodular lesion evident. No edema or airspace opacity. Heart size normal. Pulmonary vascularity on the left is normal. Scarring in the region of the pulmonary arteries on the right. No adenopathy. No bone  lesions. IMPRESSION: Postoperative changes with scarring and volume loss on the right. No edema or airspace opacity. Left lung clear. Heart size normal. Electronically Signed   By: Lowella Grip III M.D.   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   I reviewed the pathology and stage with the patient in detail.  #Discussed with the patient that large cell neuroendocrine is uncommon malignancy; and the adjuvant therapy guidelines are based on small cell lung cancer data.  Given the overall aggressiveness of loss of neuroendocrine tumor-I would recommend adjuvant chemotherapy with platinum-etoposide every 3 weeks x 4. Discussed that the goal of chemotherapy is cure.Marland Kitchen   #After lengthy discussion patient on emotional/tearful-and states that she is not ready for any adjuvant therapy at this time.  She states that she might consider therapy in future.  Also discussed the fact that if patient has recurrence in future-chemotherapy could be considered; however she would be unlikely.  At this time patient continues to decline adjuvant therapy. Pt awaiting to see Dr.Henderickson tomorrow; wants to talk to him before making any decisions.   #I reminded the patient that she will need to be under surveillance with routine imaging in future.  #Active smoker-counseled the patient to quit smoking.  Patient is reluctant.  #Disability application: From oncologic standpoint, I do not see any obvious reason for disability.  However patient has to discuss with PCP-regarding applying for disability for other medical reasons.  Thank you Dr.Henderickson for allowing me to participate in the care of your pleasant patient. Please do not hesitate to contact me with questions or concerns in the interim.  Discussed with Hildred Alamin, nurse navigator will follow.  # DISPOSITION: # follow up TBD-Dr.B   All questions were answered. The patient knows to call the clinic with any problems, questions or concerns.    Cammie Sickle, MD 11/04/2020 8:43 AM

## 2020-11-03 NOTE — Progress Notes (Signed)
  Oncology Nurse Navigator Documentation  Navigator Location: [facility]-Med Onc (11/03/20 1400) Referral Date to RadOnc/MedOnc: 10/14/20 (11/03/20 1400) )Navigator Encounter Type: Initial MedOnc (11/03/20 1400)   Abnormal Finding Date: 08/13/20 (11/03/20 1400) Confirmed Diagnosis Date: 09/25/20 (11/03/20 1400)             Treatment Initiated Date: 09/19/20 (11/03/20 1400)   Treatment Phase: Pre-Tx/Tx Discussion (11/03/20 1400) Barriers/Navigation Needs: Coordination of Care;Food Disparities;Financial Toxicity;No Insurance (11/03/20 1400)   Interventions: Coordination of Care;Disability/FMLA;Referrals (11/03/20 1400) Referrals: Social Work (11/03/20 1400) Coordination of Care: Appts;Chemo (11/03/20 1400)        Acuity: Level 3-Moderate Needs (3-4 Barriers Identified) (11/03/20 1400)    Met with patient during initial consult with Dr. Rogue Bussing to discuss further treatment options. All questions answered during visit. Pt given resources regarding diagnosis and supportive services available. Pt voiced concerns about finances and lack of insurance. Informed that will refer to social worker that can help connect her with resources. Informed that will follow up with her by phone later this week to discuss her decision about pursuing adjuvant chemotherapy or routine follow up imaging only after her visit with Dr. Roxan Hockey on 1/25. Contact info given to patient and informed to call with any questions or needs. Pt verbalized understanding. Nothing further needed at this time.     Time Spent with Patient: 60 (11/03/20 1400)

## 2020-11-03 NOTE — Assessment & Plan Note (Addendum)
#  Non-small cell lung cancer-stage I [mixed-predominant large cell neuroendocrine; adenocarcinoma].   I reviewed the pathology and stage with the patient in detail.  #Discussed with the patient that large cell neuroendocrine is uncommon malignancy; and the adjuvant therapy guidelines are based on small cell lung cancer data.  Given the overall aggressiveness of loss of neuroendocrine tumor-I would recommend adjuvant chemotherapy with platinum-etoposide every 3 weeks x 4. Discussed that the goal of chemotherapy is cure.Marland Kitchen   #After lengthy discussion patient on emotional/tearful-and states that she is not ready for any adjuvant therapy at this time.  She states that she might consider therapy in future.  Also discussed the fact that if patient has recurrence in future-chemotherapy could be considered; however she would be unlikely.  At this time patient continues to decline adjuvant therapy. Pt awaiting to see Dr.Henderickson tomorrow; wants to talk to him before making any decisions.   #I reminded the patient that she will need to be under surveillance with routine imaging in future.  #Active smoker-counseled the patient to quit smoking.  Patient is reluctant.  #Disability application: From oncologic standpoint, I do not see any obvious reason for disability.  However patient has to discuss with PCP-regarding applying for disability for other medical reasons.  Thank you Dr.Henderickson for allowing me to participate in the care of your pleasant patient. Please do not hesitate to contact me with questions or concerns in the interim.  Discussed with Hildred Alamin, nurse navigator will follow.  # DISPOSITION: # follow up TBD-Dr.B

## 2020-11-04 ENCOUNTER — Ambulatory Visit
Admission: RE | Admit: 2020-11-04 | Discharge: 2020-11-04 | Disposition: A | Discharge status: Home / Self Care | Payer: Self-pay | Source: Ambulatory Visit | Attending: Thoracic Surgery (Cardiothoracic Vascular Surgery) | Admitting: Thoracic Surgery (Cardiothoracic Vascular Surgery)

## 2020-11-04 ENCOUNTER — Ambulatory Visit (INDEPENDENT_AMBULATORY_CARE_PROVIDER_SITE_OTHER): Payer: Self-pay | Admitting: Thoracic Surgery (Cardiothoracic Vascular Surgery)

## 2020-11-04 ENCOUNTER — Encounter: Payer: Self-pay | Admitting: Thoracic Surgery (Cardiothoracic Vascular Surgery)

## 2020-11-04 ENCOUNTER — Other Ambulatory Visit: Payer: Self-pay

## 2020-11-04 VITALS — BP 113/79 | HR 79 | Resp 20 | Ht 67.0 in | Wt 158.0 lb

## 2020-11-04 DIAGNOSIS — Z902 Acquired absence of lung [part of]: Secondary | ICD-10-CM

## 2020-11-04 DIAGNOSIS — Z09 Encounter for follow-up examination after completed treatment for conditions other than malignant neoplasm: Secondary | ICD-10-CM

## 2020-11-04 NOTE — Progress Notes (Signed)
[address]       [place] [address]             [phone]     HPI:Ms. Zappia returns for follow-up after her recent right upper lobectomy.  Aleyna Cueva is a 60 year old woman with a history of tobacco abuse.  She had a right upper lobe nodule discovered on a low-dose CT for lung cancer screening.  On PET CT there was a clinical stage Ia lesion.  I did a robotic right upper lobectomy on 09/19/2020.  The nodule turned out to be a stage Ia (T1b, N0) mixed large cell neuroendocrine and adenocarcinoma.  It was predominantly large cell neuroendocrine.  She did well postoperatively and went home on day 4.  I saw her back on 10/14/2020.  She was doing well at that time.  She was using Aleve and acetaminophen for pain.  She saw Dr. Burlene Arnt.  He recommended adjuvant chemotherapy based on the large cell neuroendocrine component.  She is reluctant to do that.  Surgical standpoint she is doing well.  She has very little discomfort.  She is not having any respiratory issues.  Current Outpatient Medications  Medication Sig Dispense Refill  . acetaminophen (TYLENOL) 500 MG tablet Take 2 tablets (1,000 mg total) by mouth every 6 (six) hours as needed. 30 tablet 0  . naproxen sodium (ALEVE) 220 MG tablet Take 220 mg by mouth 2 (two) times daily as needed (pain).    . rosuvastatin (CRESTOR) 10 MG tablet Take 1 tablet (10 mg total) by mouth daily. 30 tablet 2   No current facility-administered medications for this visit.    Physical Exam BP 113/79 (BP Location: Left Arm, Patient Position: Sitting)   Pulse 79   Resp 20   Ht 5\' 7"  (1.702 m)   Wt 158 lb (71.7 kg)   SpO2 93% Comment: RA with mask on  BMI 24.74 kg/m  60 year old woman in no acute distress Alert and oriented x3 with no focal deficits Lungs slightly diminished at right base but otherwise clear Incisions well-healed  Diagnostic Tests: CHEST - 2 VIEW  COMPARISON:  October 14, 2020  FINDINGS: Postoperative change noted on the right with volume loss. There is new ill-defined opacity in the right mid lung anteriorly. Lungs elsewhere are clear. Heart size and pulmonary vascularity are normal. No adenopathy. No bone lesions.  IMPRESSION: Postoperative change on the right with volume loss. Ill-defined opacity anterior right mid lung, likely developing focus of pneumonia. Lungs otherwise clear. Stable cardiac silhouette.  These results will be called to the ordering clinician or representative by the Radiologist Assistant, and communication documented in the PACS or [employer].   Electronically Signed   By: Lowella Grip III M.D.   On: 11/04/2020 10:15 I personally reviewed the chest x-ray images and concur with the findings noted above  Impression: Kindall Swaby is a 60 year old woman with a history of tobacco abuse who was found to have a lung nodule on a low-dose CT for lung cancer screening.  She had a robotic right upper lobectomy for what turned out to be a T1b, N0, stage Ia mixed large cell neuroendocrine and adenocarcinoma.  From a surgical standpoint she is doing extremely well.  She has minimal discomfort.  Her exercise tolerance is good.  She had a lot of questions about chemotherapy.  Dr. Burlene Arnt recommended chemotherapy based on the large cell neuroendocrine component.  I discussed the issues related to that with  her.  She understands that this is a relatively uncommon form of lung cancer.  She understands that studies are limited, but have shown some benefit to chemotherapy even in very early stage lesions.  She understands there is no randomized control trials.  Should she understands as far as we know she had a complete resection.  After discussion she says that she does not wish to do chemotherapy.  Plan: Follow-up with Dr. Burlene Arnt.  I will defer imaging to him. I will see her back in about 6 months after her CT.  Melrose Nakayama, MD [facility] [phone]

## 2020-11-05 ENCOUNTER — Telehealth: Payer: Self-pay | Admitting: *Deleted

## 2020-11-05 DIAGNOSIS — C3490 Malignant neoplasm of unspecified part of unspecified bronchus or lung: Secondary | ICD-10-CM

## 2020-11-05 NOTE — Telephone Encounter (Signed)
Follow up phone call made to patient to question if made a decision about pursuing chemotherapy or ongoing surveillance. All questions answered during call regarding options as previously discussed with Dr. Jacinto Reap. Pt stated that she has decided to not pursue chemotherapy at this time and only wants routine follow up with Dr. Jacinto Reap.

## 2020-11-10 ENCOUNTER — Telehealth: Payer: Self-pay | Admitting: Gerontology

## 2020-11-10 NOTE — Telephone Encounter (Signed)
Orders have been entered. Scheduling message sent.

## 2020-11-10 NOTE — Telephone Encounter (Signed)
[name] Thank you for the follow up. I will follow up in 6 months- MD; labs- cbc/cmp; CT chest with contrast- few days prior.  Thanks GB

## 2020-11-10 NOTE — Telephone Encounter (Signed)
Mailed patient an appointment letter with appointments date/time,[facility] financial application, AVS and [place] [employer] information per 1/18 staff msg.

## 2020-11-18 ENCOUNTER — Encounter: Payer: Self-pay | Admitting: Thoracic Surgery (Cardiothoracic Vascular Surgery)

## 2020-11-26 ENCOUNTER — Other Ambulatory Visit: Payer: Self-pay

## 2020-11-26 DIAGNOSIS — E785 Hyperlipidemia, unspecified: Secondary | ICD-10-CM

## 2020-11-26 DIAGNOSIS — R7303 Prediabetes: Secondary | ICD-10-CM

## 2020-11-26 DIAGNOSIS — C3490 Malignant neoplasm of unspecified part of unspecified bronchus or lung: Secondary | ICD-10-CM

## 2020-11-26 NOTE — Progress Notes (Signed)
cbc

## 2020-11-27 LAB — LIPID PANEL
Chol/HDL Ratio: 3.6 ratio (ref 0.0–4.4)
Cholesterol, Total: 157 mg/dL (ref 100–199)
HDL: 44 mg/dL (ref >39)
LDL Chol Calc (NIH): 94 mg/dL (ref 0–99)
Triglycerides: 105 mg/dL (ref 0–149)
VLDL Cholesterol Cal: 19 mg/dL (ref 5–40)

## 2020-11-27 LAB — CBC WITH DIFFERENTIAL/PLATELET
Basophils Absolute: 0.1 10*3/uL (ref 0.0–0.2)
Basos: 1 %
EOS (ABSOLUTE): 0.2 10*3/uL (ref 0.0–0.4)
Eos: 3 %
Hematocrit: 47.6 % — ABNORMAL HIGH (ref 34.0–46.6)
Hemoglobin: 15.5 g/dL (ref 11.1–15.9)
Immature Grans (Abs): 0 10*3/uL (ref 0.0–0.1)
Immature Granulocytes: 0 %
Lymphocytes Absolute: 3.6 10*3/uL — ABNORMAL HIGH (ref 0.7–3.1)
Lymphs: 51 %
MCH: 28.4 pg (ref 26.6–33.0)
MCHC: 32.6 g/dL (ref 31.5–35.7)
MCV: 87 fL (ref 79–97)
Monocytes Absolute: 0.6 10*3/uL (ref 0.1–0.9)
Monocytes: 9 %
Neutrophils Absolute: 2.5 10*3/uL (ref 1.4–7.0)
Neutrophils: 36 %
Platelets: 317 10*3/uL (ref 150–450)
RBC: 5.46 x10E6/uL — ABNORMAL HIGH (ref 3.77–5.28)
RDW: 13.8 % (ref 11.7–15.4)
WBC: 6.9 10*3/uL (ref 3.4–10.8)

## 2020-11-27 LAB — COMPREHENSIVE METABOLIC PANEL
ALT: 9 IU/L (ref 0–32)
AST: 18 IU/L (ref 0–40)
Albumin/Globulin Ratio: 1.4 (ref 1.2–2.2)
Albumin: 4 g/dL (ref 3.8–4.9)
Alkaline Phosphatase: 113 IU/L (ref 44–121)
BUN/Creatinine Ratio: 12 (ref 9–23)
BUN: 8 mg/dL (ref 6–24)
Bilirubin Total: 0.3 mg/dL (ref 0.0–1.2)
CO2: 22 mmol/L (ref 20–29)
Calcium: 9.5 mg/dL (ref 8.7–10.2)
Chloride: 103 mmol/L (ref 96–106)
Creatinine, Ser: 0.69 mg/dL (ref 0.57–1.00)
GFR calc Af Amer: 110 mL/min/{1.73_m2} (ref >59)
GFR calc non Af Amer: 96 mL/min/{1.73_m2} (ref >59)
Globulin, Total: 2.9 g/dL (ref 1.5–4.5)
Glucose: 99 mg/dL (ref 65–99)
Potassium: 4.1 mmol/L (ref 3.5–5.2)
Sodium: 139 mmol/L (ref 134–144)
Total Protein: 6.9 g/dL (ref 6.0–8.5)

## 2020-11-27 LAB — HEMOGLOBIN A1C
Est. average glucose Bld gHb Est-mCnc: 131 mg/dL
Hgb A1c MFr Bld: 6.2 % — ABNORMAL HIGH (ref 4.8–5.6)

## 2020-12-04 ENCOUNTER — Other Ambulatory Visit: Payer: Self-pay

## 2020-12-04 ENCOUNTER — Ambulatory Visit: Payer: Self-pay | Admitting: Gerontology

## 2020-12-04 ENCOUNTER — Encounter: Payer: Self-pay | Admitting: Gerontology

## 2020-12-04 VITALS — BP 108/77 | HR 75 | Resp 16 | Wt 158.4 lb

## 2020-12-04 DIAGNOSIS — Z0000 Encounter for general adult medical examination without abnormal findings: Secondary | ICD-10-CM

## 2020-12-04 DIAGNOSIS — E785 Hyperlipidemia, unspecified: Secondary | ICD-10-CM

## 2020-12-04 DIAGNOSIS — R7303 Prediabetes: Secondary | ICD-10-CM

## 2020-12-04 MED ORDER — METFORMIN HCL 500 MG PO TABS: 500.0000 mg | ORAL_TABLET | Freq: Every day | ORAL | 0 refills | Status: DC

## 2020-12-04 MED ORDER — METFORMIN HCL 500 MG PO TABS
500.0000 mg | ORAL_TABLET | Freq: Every day | ORAL | 0 refills | Status: DC
Start: 2020-12-04 — End: 2020-12-04

## 2020-12-04 NOTE — Progress Notes (Signed)
Established Patient Office Visit  Subjective:  Patient ID: Stacie Kennedy, female    DOB: 1960/12/14  Age: 60 y.o. MRN: [id number]  CC: F/u prediabetes and hyperlipidemia  HPI Stacie Kennedy is a 60 year old female who presents for follow up of hyperlipidemia and prediabetes. She reports compliance with her current medication regimen and continues to make healthy lifestyle changes. Her lipid panel obtained 11/26/20 is significantly improved. Her HgbA1c was 6.2% from 6.2% previously. She denies any symptoms of hypo/hyperglycemia, polyuria, polydipsia, or polyphagia. She denies any peripheral neuropathy. She denies any arthralgias or myalgias and is tolerating her Crestor well. Overall, she feels well and has no concerns.   Past Medical History:  Diagnosis Date  . Cancer ([place])    lung cancer  . High cholesterol   . Pre-diabetes   . Umbilical hernia     Past Surgical History:  Procedure Laterality Date  . INTERCOSTAL NERVE BLOCK  09/19/2020   Procedure: INTERCOSTAL NERVE BLOCK;  Surgeon: Melrose Nakayama, MD;  Location: [facility];  Service: Thoracic;;  . LUNG REMOVAL, PARTIAL Right 09/19/2020  . NODE DISSECTION Right 09/19/2020   Procedure: NODE DISSECTION;  Surgeon: Melrose Nakayama, MD;  Location: [facility];  Service: Thoracic;  Laterality: Right;    No family history on file.  Social History   Socioeconomic History  . Marital status: Single    Spouse name: Not on file  . Number of children: Not on file  . Years of education: Not on file  . Highest education level: Not on file  Occupational History  . Not on file  Tobacco Use  . Smoking status: Current Every Day Smoker    Packs/day: 0.10    Years: 43.00    Pack years: 4.30    Types: Cigarettes  . Smokeless tobacco: Never Used  . Tobacco comment: states she is slowing, trying to quit  Vaping Use  . Vaping Use: Never used  Substance and Sexual Activity  . Alcohol use: Yes    Comment: 1 qt. beer per week  . Drug use:  No  . Sexual activity: Not on file  Other Topics Concern  . Not on file  Social History Narrative   Lives in [place]; smokes; now and then beer; with boy friend. 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    Social Determinants of Health   Financial Resource Strain: High Risk  . Difficulty of Paying Living Expenses: Hard  Food Insecurity: Food Insecurity Present  . Worried About Charity fundraiser in the Last Year: Sometimes true  . Ran Out of Food in the Last Year: Sometimes true  Transportation Needs: No Transportation Needs  . Lack of Transportation (Medical): No  . Lack of Transportation (Non-Medical): No  Physical Activity: Sufficiently Active  . Days of Exercise per Week: 5 days  . Minutes of Exercise per Session: 60 min  Stress: Stress Concern Present  . Feeling of Stress : Very much  Social Connections: Socially Isolated  . Frequency of Communication with Friends and Family: Never  . Frequency of Social Gatherings with Friends and Family: Never  . Attends Religious Services: Never  . Active Member of Clubs or Organizations: No  . Attends Archivist Meetings: Never  . Marital Status: Never married  Intimate Partner Violence: Not At Risk  . Fear of Current or Ex-Partner: No  . Emotionally Abused: No  . Physically Abused: No  . Sexually Abused: No    Outpatient Medications Prior to Visit  Medication Sig Dispense Refill  . acetaminophen (TYLENOL) 500 MG tablet Take 2 tablets (1,000 mg total) by mouth every 6 (six) hours as needed. 30 tablet 0  . naproxen sodium (ALEVE) 220 MG tablet Take 220 mg by mouth 2 (two) times daily as needed (pain).    . rosuvastatin (CRESTOR) 10 MG tablet Take 1 tablet (10 mg total) by mouth daily. 30 tablet 2   No facility-administered medications prior to visit.    No Known Allergies  ROS Review of Systems  Constitutional: Negative.   HENT: Negative.   Respiratory: Positive for cough (chronic). Negative for  shortness of breath.   Cardiovascular: Negative.   Endocrine: Negative.   Genitourinary: Negative.   Musculoskeletal: Negative.   Skin: Negative.   Neurological: Negative.   Psychiatric/Behavioral: Negative.       Objective:    Physical Exam Constitutional:      Appearance: Normal appearance.  HENT:     Head: Normocephalic.     Nose: Nose normal.     Mouth/Throat:     Mouth: Mucous membranes are moist.     Pharynx: Oropharynx is clear.  Eyes:     Extraocular Movements: Extraocular movements intact.     Conjunctiva/sclera: Conjunctivae normal.     Pupils: Pupils are equal, round, and reactive to light.  Cardiovascular:     Rate and Rhythm: Normal rate and regular rhythm.     Pulses: Normal pulses.  Pulmonary:     Effort: Pulmonary effort is normal.     Breath sounds: Normal breath sounds.     Comments: RUL lobectomy Abdominal:     General: Bowel sounds are normal.     Palpations: Abdomen is soft.  Musculoskeletal:        General: Normal range of motion.  Skin:    General: Skin is warm and dry.  Neurological:     Mental Status: She is alert and oriented to person, place, and time.  Psychiatric:        Mood and Affect: Mood normal.        Behavior: Behavior normal.     There were no vitals taken for this visit. Wt Readings from Last 3 Encounters:  11/26/20 157 lb (71.2 kg)  11/04/20 158 lb (71.7 kg)  11/03/20 158 lb 9.6 oz (71.9 kg)     Health Maintenance Due  Topic Date Due  . Hepatitis C Screening  Never done  . HIV Screening  Never done  . TETANUS/TDAP  Never done  . PAP SMEAR-Modifier  Never done  . COLONOSCOPY (Pts 45-23yrs Insurance coverage will need to be confirmed)  Never done  . MAMMOGRAM  Never done  . COVID-19 Vaccine (3 - Moderna risk 4-dose series) 05/06/2020  . INFLUENZA VACCINE  Never done    There are no preventive care reminders to display for this patient.  No results found for: TSH Lab Results  Component Value Date   WBC 6.9  11/26/2020   HGB 15.5 11/26/2020   HCT 47.6 (H) 11/26/2020   MCV 87 11/26/2020   PLT 317 11/26/2020   Lab Results  Component Value Date   NA 139 11/26/2020   K 4.1 11/26/2020   CO2 22 11/26/2020   GLUCOSE 99 11/26/2020   BUN 8 11/26/2020   CREATININE 0.69 11/26/2020   BILITOT 0.3 11/26/2020   ALKPHOS 113 11/26/2020   AST 18 11/26/2020   ALT 9 11/26/2020   PROT 6.9 11/26/2020   ALBUMIN 4.0 11/26/2020   CALCIUM 9.5 11/26/2020  ANIONGAP 5 09/21/2020   Lab Results  Component Value Date   CHOL 157 11/26/2020   Lab Results  Component Value Date   HDL 44 11/26/2020   Lab Results  Component Value Date   LDLCALC 94 11/26/2020   Lab Results  Component Value Date   TRIG 105 11/26/2020   Lab Results  Component Value Date   CHOLHDL 3.6 11/26/2020   Lab Results  Component Value Date   HGBA1C 6.2 (H) 11/26/2020      Assessment & Plan:   1. Prediabetes Educated on dietary modifications and carb counting. Avoid sugary drinks and increase water intake.   Exercise 150 min/week as tolerated.  Notify of any GI upset, fatigue, SOB, dizziness, or muscle pain or cramps.  Will recheck HgbA1c in 3 months.  Encouraged to perform daily self foot exams.  - metFORMIN (GLUCOPHAGE) 500 MG tablet; Take 1 tablet (500 mg total) by mouth daily with breakfast.  Dispense: 30 tablet; Refill: 0  2. Health care maintenance Colonoscopy, mammogram, and pap ordered - Ambulatory referral to Gastroenterology  3. Elevated lipids Continue current medication regimen with Crestor. Notify of any muscle pain or cramps or changes in urine.     Follow-up: Return in 1 month (01/01/21), or if symptoms worsen or fail to improve.     Clayton Bibles, RN, BSN, FNP-S

## 2020-12-04 NOTE — Patient Instructions (Signed)
Prediabetes Eating Plan Prediabetes is a condition that causes blood sugar (glucose) levels to be higher than normal. This increases the risk for developing type 2 diabetes (type 2 diabetes mellitus). Working with a health care provider or nutrition specialist (dietitian) to make diet and lifestyle changes can help prevent the onset of diabetes. These changes may help you:  Control your blood glucose levels.  Improve your cholesterol levels.  Manage your blood pressure. What are tips for following this plan? Reading food labels  Read food labels to check the amount of fat, salt (sodium), and sugar in prepackaged foods. Avoid foods that have: ? Saturated fats. ? Trans fats. ? Added sugars.  Avoid foods that have more than 300 milligrams (mg) of sodium per serving. Limit your sodium intake to less than 2,300 mg each day. Shopping  Avoid buying pre-made and processed foods.  Avoid buying drinks with added sugar. Cooking  Cook with olive oil. Do not use butter, lard, or ghee.  Bake, broil, grill, steam, or boil foods. Avoid frying. Meal planning  Work with your dietitian to create an eating plan that is right for you. This may include tracking how many calories you take in each day. Use a food diary, notebook, or mobile application to track what you eat at each meal.  Consider following a Mediterranean diet. This includes: ? Eating several servings of fresh fruits and vegetables each day. ? Eating fish at least twice a week. ? Eating one serving each day of whole grains, beans, nuts, and seeds. ? Using olive oil instead of other fats. ? Limiting alcohol. ? Limiting red meat. ? Using nonfat or low-fat dairy products.  Consider following a plant-based diet. This includes dietary choices that focus on eating mostly vegetables and fruit, grains, beans, nuts, and seeds.  If you have high blood pressure, you may need to limit your sodium intake or follow a diet such as the DASH  (Dietary Approaches to Stop Hypertension) eating plan. The DASH diet aims to lower high blood pressure.   Lifestyle  Set weight loss goals with help from your health care team. It is recommended that most people with prediabetes lose 7% of their body weight.  Exercise for at least 30 minutes 5 or more days a week.  Attend a support group or seek support from a mental health counselor.  Take over-the-counter and prescription medicines only as told by your health care provider. What foods are recommended? Fruits Berries. Bananas. Apples. Oranges. Grapes. Papaya. Mango. Pomegranate. Kiwi. Grapefruit. Cherries. Vegetables Lettuce. Spinach. Peas. Beets. Cauliflower. Cabbage. Broccoli. Carrots. Tomatoes. Squash. Eggplant. Herbs. Peppers. Onions. Cucumbers. Brussels sprouts. Grains Whole grains, such as whole-wheat or whole-grain breads, crackers, cereals, and pasta. Unsweetened oatmeal. Bulgur. Barley. Quinoa. Brown rice. Corn or whole-wheat flour tortillas or taco shells. Meats and other proteins Seafood. Poultry without skin. Lean cuts of pork and beef. Tofu. Eggs. Nuts. Beans. Dairy Low-fat or fat-free dairy products, such as yogurt, cottage cheese, and cheese. Beverages Water. Tea. Coffee. Sugar-free or diet soda. Seltzer water. Low-fat or nonfat milk. Milk alternatives, such as soy or almond milk. Fats and oils Olive oil. Canola oil. Sunflower oil. Grapeseed oil. Avocado. Walnuts. Sweets and desserts Sugar-free or low-fat pudding. Sugar-free or low-fat ice cream and other frozen treats. Seasonings and condiments Herbs. Sodium-free spices. Mustard. Relish. Low-salt, low-sugar ketchup. Low-salt, low-sugar barbecue sauce. Low-fat or fat-free mayonnaise. The items listed above may not be a complete list of recommended foods and beverages. Contact a dietitian for more  information. What foods are not recommended? Fruits Fruits canned with syrup. Vegetables Canned vegetables. Frozen  vegetables with butter or cream sauce. Grains Refined white flour and flour products, such as bread, pasta, snack foods, and cereals. Meats and other proteins Fatty cuts of meat. Poultry with skin. Breaded or fried meat. Processed meats. Dairy Full-fat yogurt, cheese, or milk. Beverages Sweetened drinks, such as iced tea and soda. Fats and oils Butter. Lard. Ghee. Sweets and desserts Baked goods, such as cake, cupcakes, pastries, cookies, and cheesecake. Seasonings and condiments Spice mixes with added salt. Ketchup. Barbecue sauce. Mayonnaise. The items listed above may not be a complete list of foods and beverages that are not recommended. Contact a dietitian for more information. Where to find more information  American Diabetes Association: www.diabetes.org Summary  You may need to make diet and lifestyle changes to help prevent the onset of diabetes. These changes can help you control blood sugar, improve cholesterol levels, and manage blood pressure.  Set weight loss goals with help from your health care team. It is recommended that most people with prediabetes lose 7% of their body weight.  Consider following a Mediterranean diet. This includes eating plenty of fresh fruits and vegetables, whole grains, beans, nuts, seeds, fish, and low-fat dairy, and using olive oil instead of other fats. This information is not intended to replace advice given to you by your health care provider. Make sure you discuss any questions you have with your health care provider. Document Revised: 12/27/2019 Document Reviewed: 12/27/2019 Elsevier Patient Education  [place]. https://www.diabeteseducator.org/docs/default-source/living-with-diabetes/conquering-the-grocery-store-v1.pdf?sfvrsn=4">  Carbohydrate Counting for Diabetes Mellitus, Adult Carbohydrate counting is a method of keeping track of how many carbohydrates you eat. Eating carbohydrates naturally increases the amount of sugar  (glucose) in the blood. Counting how many carbohydrates you eat improves your blood glucose control, which helps you manage your diabetes. It is important to know how many carbohydrates you can safely have in each meal. This is different for every person. A dietitian can help you make a meal plan and calculate how many carbohydrates you should have at each meal and snack. What foods contain carbohydrates? Carbohydrates are found in the following foods:  Grains, such as breads and cereals.  Dried beans and soy products.  Starchy vegetables, such as potatoes, peas, and corn.  Fruit and fruit juices.  Milk and yogurt.  Sweets and snack foods, such as cake, cookies, candy, chips, and soft drinks.   How do I count carbohydrates in foods? There are two ways to count carbohydrates in food. You can read food labels or learn standard serving sizes of foods. You can use either of the methods or a combination of both. Using the Nutrition Facts label The Nutrition Facts list is included on the labels of almost all packaged foods and beverages in the [place] It includes:  The serving size.  Information about nutrients in each serving, including the grams (g) of carbohydrate per serving. To use the Nutrition Facts:  Decide how many servings you will have.  Multiply the number of servings by the number of carbohydrates per serving.  The resulting number is the total amount of carbohydrates that you will be having. Learning the standard serving sizes of foods When you eat carbohydrate foods that are not packaged or do not include Nutrition Facts on the label, you need to measure the servings in order to count the amount of carbohydrates.  Measure the foods that you will eat with a food scale or measuring cup,  if needed.  Decide how many standard-size servings you will eat.  Multiply the number of servings by 15. For foods that contain carbohydrates, one serving equals 15 g of carbohydrates. ? For  example, if you eat 2 cups or 10 oz (300 g) of strawberries, you will have eaten 2 servings and 30 g of carbohydrates (2 servings x 15 g = 30 g).  For foods that have more than one food mixed, such as soups and casseroles, you must count the carbohydrates in each food that is included. The following list contains standard serving sizes of common carbohydrate-rich foods. Each of these servings has about 15 g of carbohydrates:  1 slice of bread.  1 six-inch (15 cm) tortilla.  ? cup or 2 oz (53 g) cooked rice or pasta.   cup or 3 oz (85 g) cooked or canned, drained and rinsed beans or lentils.   cup or 3 oz (85 g) starchy vegetable, such as peas, corn, or squash.   cup or 4 oz (120 g) hot cereal.   cup or 3 oz (85 g) boiled or mashed potatoes, or  or 3 oz (85 g) of a large baked potato.   cup or 4 fl oz (118 mL) fruit juice.  1 cup or 8 fl oz (237 mL) milk.  1 small or 4 oz (106 g) apple.   or 2 oz (63 g) of a medium banana.  1 cup or 5 oz (150 g) strawberries.  3 cups or 1 oz (24 g) popped popcorn. What is an example of carbohydrate counting? To calculate the number of carbohydrates in this sample meal, follow the steps shown below. Sample meal  3 oz (85 g) chicken breast.  ? cup or 4 oz (106 g) brown rice.   cup or 3 oz (85 g) corn.  1 cup or 8 fl oz (237 mL) milk.  1 cup or 5 oz (150 g) strawberries with sugar-free whipped topping. Carbohydrate calculation 1. Identify the foods that contain carbohydrates: ? Rice. ? Corn. ? Milk. ? Strawberries. 2. Calculate how many servings you have of each food: ? 2 servings rice. ? 1 serving corn. ? 1 serving milk. ? 1 serving strawberries. 3. Multiply each number of servings by 15 g: ? 2 servings rice x 15 g = 30 g. ? 1 serving corn x 15 g = 15 g. ? 1 serving milk x 15 g = 15 g. ? 1 serving strawberries x 15 g = 15 g. 4. Add together all of the amounts to find the total grams of carbohydrates eaten: ? 30 g + 15  g + 15 g + 15 g = 75 g of carbohydrates total. What are tips for following this plan? Shopping  Develop a meal plan and then make a shopping list.  Buy fresh and frozen vegetables, fresh and frozen fruit, dairy, eggs, beans, lentils, and whole grains.  Look at food labels. Choose foods that have more fiber and less sugar.  Avoid processed foods and foods with added sugars. Meal planning  Aim to have the same amount of carbohydrates at each meal and for each snack time.  Plan to have regular, balanced meals and snacks. Where to find more information  American Diabetes Association: www.diabetes.org  Centers for Disease Control and Prevention: http://www.wolf.info/ Summary  Carbohydrate counting is a method of keeping track of how many carbohydrates you eat.  Eating carbohydrates naturally increases the amount of sugar (glucose) in the blood.  Counting how many  carbohydrates you eat improves your blood glucose control, which helps you manage your diabetes.  A dietitian can help you make a meal plan and calculate how many carbohydrates you should have at each meal and snack. This information is not intended to replace advice given to you by your health care provider. Make sure you discuss any questions you have with your health care provider. Document Revised: 09/27/2019 Document Reviewed: 09/28/2019 Elsevier Patient Education  2021 [employer].

## 2021-01-01 ENCOUNTER — Other Ambulatory Visit: Payer: Self-pay

## 2021-01-01 ENCOUNTER — Ambulatory Visit: Payer: Self-pay | Admitting: Gerontology

## 2021-01-01 ENCOUNTER — Other Ambulatory Visit: Payer: Self-pay | Admitting: Gerontology

## 2021-01-01 ENCOUNTER — Encounter: Payer: Self-pay | Admitting: Gerontology

## 2021-01-01 VITALS — BP 113/75 | HR 72 | Temp 97.3°F | Resp 16 | Wt 157.3 lb

## 2021-01-01 DIAGNOSIS — F329 Major depressive disorder, single episode, unspecified: Secondary | ICD-10-CM | POA: Insufficient documentation

## 2021-01-01 DIAGNOSIS — F17200 Nicotine dependence, unspecified, uncomplicated: Secondary | ICD-10-CM

## 2021-01-01 DIAGNOSIS — R7303 Prediabetes: Secondary | ICD-10-CM

## 2021-01-01 DIAGNOSIS — F331 Major depressive disorder, recurrent, moderate: Secondary | ICD-10-CM

## 2021-01-01 MED ORDER — METFORMIN HCL 500 MG PO TABS: 500.0000 mg | ORAL_TABLET | Freq: Every day | ORAL | 2 refills | Status: DC

## 2021-01-01 NOTE — Progress Notes (Signed)
Established Patient Office Visit  Subjective:  Patient ID: Stacie Kennedy, female    DOB: [birth year]  Age: 60 y.o. MRN: [id number]  CC: No chief complaint on file.   HPI Stacie Kennedy is a 60 year old female who presents for follow up after initiation of metformin therapy for prediabetes. Her Hgb A1c at her previous appt was 6.2%. She reports compliance with her medication regimen and continues to make healthy lifestyle choices. She denies any polyuria, polydipsia, or polyphagia. She denies any lightheadedness or dizziness. She does report feeling depressed recently. She had to quit her job in March because it was too taxing on her and is currently concerned about her financial status. She has not been able to apply for unemployment because she is unable to use a computer and get not get through on the phone. She reports sleeping all of the time and feeling tired. She denies any SI/HI. Overall, she is concerned about her mood and would like further treatment for it.   Past Medical History:  Diagnosis Date  . Cancer ([place])    lung cancer  . High cholesterol   . Pre-diabetes   . Umbilical hernia     Past Surgical History:  Procedure Laterality Date  . INTERCOSTAL NERVE BLOCK  09/19/2020   Procedure: INTERCOSTAL NERVE BLOCK;  Surgeon: Melrose Nakayama, MD;  Location: [facility];  Service: Thoracic;;  . LUNG REMOVAL, PARTIAL Right 09/19/2020  . NODE DISSECTION Right 09/19/2020   Procedure: NODE DISSECTION;  Surgeon: Melrose Nakayama, MD;  Location: [facility];  Service: Thoracic;  Laterality: Right;    No family history on file.  Social History   Socioeconomic History  . Marital status: Single    Spouse name: Not on file  . Number of children: Not on file  . Years of education: Not on file  . Highest education level: Not on file  Occupational History  . Not on file  Tobacco Use  . Smoking status: Current Every Day Smoker    Packs/day: 0.10    Years: 43.00    Pack years: 4.30     Types: Cigarettes  . Smokeless tobacco: Never Used  . Tobacco comment: states she is slowing, trying to quit  Vaping Use  . Vaping Use: Never used  Substance and Sexual Activity  . Alcohol use: Yes    Comment: 1 qt. beer per week  . Drug use: No  . Sexual activity: Not on file  Other Topics Concern  . Not on file  Social History Narrative   Lives in [place]; smokes; now and then beer; with boy friend. 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    Social Determinants of Health   Financial Resource Strain: High Risk  . Difficulty of Paying Living Expenses: Hard  Food Insecurity: Food Insecurity Present  . Worried About Charity fundraiser in the Last Year: Sometimes true  . Ran Out of Food in the Last Year: Sometimes true  Transportation Needs: No Transportation Needs  . Lack of Transportation (Medical): No  . Lack of Transportation (Non-Medical): No  Physical Activity: Sufficiently Active  . Days of Exercise per Week: 5 days  . Minutes of Exercise per Session: 60 min  Stress: Stress Concern Present  . Feeling of Stress : Very much  Social Connections: Socially Isolated  . Frequency of Communication with Friends and Family: Never  . Frequency of Social Gatherings with Friends and Family: Never  . Attends Religious Services: Never  . Active  Member of Clubs or Organizations: No  . Attends Archivist Meetings: Never  . Marital Status: Never married  Intimate Partner Violence: Not At Risk  . Fear of Current or Ex-Partner: No  . Emotionally Abused: No  . Physically Abused: No  . Sexually Abused: No    Outpatient Medications Prior to Visit  Medication Sig Dispense Refill  . metFORMIN (GLUCOPHAGE) 500 MG tablet Take 1 tablet (500 mg total) by mouth daily with breakfast. 30 tablet 0  . rosuvastatin (CRESTOR) 10 MG tablet Take 1 tablet (10 mg total) by mouth daily. 30 tablet 2   No facility-administered medications prior to visit.    No Known  Allergies  ROS Review of Systems  Constitutional: Positive for fatigue. Negative for fever and unexpected weight change.  HENT: Negative.   Respiratory: Positive for cough (chronic cough). Negative for shortness of breath and wheezing.   Cardiovascular: Negative.   Gastrointestinal: Negative.   Endocrine: Negative.   Genitourinary: Negative.   Musculoskeletal: Negative.   Skin: Negative.   Neurological: Negative.   Psychiatric/Behavioral: Positive for dysphoric mood. Negative for suicidal ideas.      Objective:    Physical Exam Constitutional:      Appearance: Normal appearance. She is normal weight.  HENT:     Head: Normocephalic.     Nose: Nose normal.  Eyes:     Conjunctiva/sclera: Conjunctivae normal.     Pupils: Pupils are equal, round, and reactive to light.  Cardiovascular:     Rate and Rhythm: Normal rate and regular rhythm.     Pulses: Normal pulses.     Heart sounds: Normal heart sounds.  Pulmonary:     Effort: Pulmonary effort is normal.  Abdominal:     General: Bowel sounds are normal.  Musculoskeletal:        General: Normal range of motion.  Skin:    General: Skin is warm and dry.  Neurological:     Mental Status: She is alert and oriented to person, place, and time. Mental status is at baseline.  Psychiatric:        Mood and Affect: Mood is depressed. Affect is tearful.        Behavior: Behavior normal.        Thought Content: Thought content normal. Thought content does not include homicidal or suicidal ideation.        Judgment: Judgment normal.    PHQ 9: 12   There were no vitals taken for this visit. Wt Readings from Last 3 Encounters:  12/04/20 158 lb 6.4 oz (71.8 kg)  11/26/20 157 lb (71.2 kg)  11/04/20 158 lb (71.7 kg)     Health Maintenance Due  Topic Date Due  . Hepatitis C Screening  Never done  . HIV Screening  Never done  . TETANUS/TDAP  Never done  . PAP SMEAR-Modifier  Never done  . COLONOSCOPY (Pts 45-68yr Insurance  coverage will need to be confirmed)  Never done  . MAMMOGRAM  Never done  . COVID-19 Vaccine (3 - Moderna risk 4-dose series) 05/06/2020  . INFLUENZA VACCINE  Never done    There are no preventive care reminders to display for this patient.  No results found for: TSH Lab Results  Component Value Date   WBC 6.9 11/26/2020   HGB 15.5 11/26/2020   HCT 47.6 (H) 11/26/2020   MCV 87 11/26/2020   PLT 317 11/26/2020   Lab Results  Component Value Date   NA 139 11/26/2020  K 4.1 11/26/2020   CO2 22 11/26/2020   GLUCOSE 99 11/26/2020   BUN 8 11/26/2020   CREATININE 0.69 11/26/2020   BILITOT 0.3 11/26/2020   ALKPHOS 113 11/26/2020   AST 18 11/26/2020   ALT 9 11/26/2020   PROT 6.9 11/26/2020   ALBUMIN 4.0 11/26/2020   CALCIUM 9.5 11/26/2020   ANIONGAP 5 09/21/2020   Lab Results  Component Value Date   CHOL 157 11/26/2020   Lab Results  Component Value Date   HDL 44 11/26/2020   Lab Results  Component Value Date   LDLCALC 94 11/26/2020   Lab Results  Component Value Date   TRIG 105 11/26/2020   Lab Results  Component Value Date   CHOLHDL 3.6 11/26/2020   Lab Results  Component Value Date   HGBA1C 6.2 (H) 11/26/2020      Assessment & Plan:   1. Prediabetes Tolerating current regimen well Continues to make healthy lifestyle choices and monitor carb intake Drinks water and avoids sugary beverages Will recheck hgbA1c in 2 months and determine if further treatment is nessary Continue current medication regimen  - metFORMIN (GLUCOPHAGE) 500 MG tablet; Take 1 tablet (500 mg total) by mouth daily with breakfast.  Dispense: 30 tablet; Refill: 2 - HgB A1c; Future - Comp Met (CMET); Future  2. Smoking Smoking cessation advised. [place] provided.   3. Moderate episode of recurrent major depressive disorder (HCC) Largely related to recent job loss, financial concerns, and previous battles with lung cancer Referral to [facility] for further evaluation and  treatment No SI/HI reported by pt Agreed to notify if SI/HI develops or go to the ED    Follow-up: Follow up in 7 weeks (03/10/21), or if symptoms worsen or do not improve.     Clayton Bibles, RN, BSN, FNP-S

## 2021-01-01 NOTE — Patient Instructions (Signed)
Major Depressive Disorder, Adult Major depressive disorder is a mental health condition. This disorder affects feelings. It can also affect the body. Symptoms of this condition last most of the day, almost every day, for 2 weeks. This disorder can affect:  Relationships.  Daily activities, such as work and school.  Activities that you normally like to do. What are the causes? The cause of this condition is not known. The disorder is likely caused by a mix of things, including:  Your personality, such as being a shy person.  Your behavior, or how you act toward others.  Your thoughts and feelings.  Too much alcohol or drugs.  How you react to stress.  Health and mental problems that you have had for a long time.  Things that hurt you in the past (trauma).  Big changes in your life, such as divorce. What increases the risk? The following factors may make you more likely to develop this condition:  Having family members with depression.  Being a woman.  Problems in the family.  Low levels of some brain chemicals.  Things that caused you pain as a child, especially if you lost a parent or were abused.  A lot of stress in your life, such as from: ? Living without basic needs of life, such as food and shelter. ? Being treated poorly because of race, sex, or religion (discrimination).  Health and mental problems that you have had for a long time. What are the signs or symptoms? The main symptoms of this condition are:  Being sad all the time.  Being grouchy all the time.  Loss of interest in things and activities. Other symptoms include:  Sleeping too much or too little.  Eating too much or too little.  Gaining or losing weight, without knowing why.  Feeling tired or having low energy.  Being restless and weak.  Feeling hopeless, worthless, or guilty.  Trouble thinking clearly or making decisions.  Thoughts of hurting yourself or others, or thoughts of  ending your life.  Spending a lot of time alone.  Inability to complete common tasks of daily life. If you have very bad MDD, you may:  Believe things that are not true.  Hear, see, taste, or feel things that are not there.  Have mild depression that lasts for at least 2 years.  Feel very sad and hopeless.  Have trouble speaking or moving. How is this treated? This condition may be treated with:  Talk therapy. This teaches you to know bad thoughts, feelings, and actions and how to change them. ? This can also help you to communicate with others. ? This can be done with members of your family.  Medicines. These can be used to treat worry (anxiety), depression, or low levels of chemicals in the brain.  Lifestyle changes. You may need to: ? Limit alcohol use. ? Limit drug use. ? Get regular exercise. ? Get plenty of sleep. ? Make healthy eating choices. ? Spend more time outdoors.  Brain stimulation. This treatment excites the brain. This is done when symptoms are very bad or have not gotten better with other treatments. Follow these instructions at home: Activity  Get regular exercise as told.  Spend time outdoors as told.  Make time to do the things you enjoy.  Find ways to deal with stress. Try to: ? Meditate. ? Do deep breathing. ? Spend time in nature. ? Keep a journal.  Return to your normal activities as told by your doctor.  Ask your doctor what activities are safe for you. Alcohol and drug use  If you drink alcohol: ? Limit how much you use to:  0-1 drink a day for women.  0-2 drinks a day for men. ? Be aware of how much alcohol is in your drink. In the [place] one drink equals one 12 oz bottle of beer (355 mL), one 5 oz glass of wine (148 mL), or one 1 oz glass of hard liquor (44 mL).  Talk to your doctor about: ? Alcohol use. Alcohol can affect some medicines. ? Any drug use. General instructions  Take over-the-counter and prescription medicines  and herbal preparations only as told by your doctor.  Eat a healthy diet.  Get a lot of sleep.  Think about joining a support group. Your doctor may be able to suggest one.  Keep all follow-up visits as told by your doctor. This is important.   Where to find more information:  [employer] on Mental Illness: www.nami.[place]: https://carter.com/  American Psychiatric Association: www.psychiatry.org/patients-families/ Contact a doctor if:  Your symptoms get worse.  You get new symptoms. Get help right away if:  You hurt yourself.  You have serious thoughts about hurting yourself or others.  You see, hear, taste, smell, or feel things that are not there. If you ever feel like you may hurt yourself or others, or have thoughts about taking your own life, get help right away. Go to your nearest emergency department or:  Call your local emergency services (911 in the [place]  Call a suicide crisis helpline, such as the [place] at 715-761-4005. This is open 24 hours a day in the [place]  Text the Crisis Text Line at 947 692 8189 (in the [place].). Summary  Major depressive disorder is a mental health condition. This disorder affects feelings. Symptoms of this condition last most of the day, almost every day, for 2 weeks.  The symptoms of this disorder can cause problems with relationships and with daily activities.  There are treatments and support for people who get this disorder. You may need more than one type of treatment.  Get help right away if you have serious thoughts about hurting yourself or others. This information is not intended to replace advice given to you by your health care provider. Make sure you discuss any questions you have with your health care provider. Document Revised: 09/08/2019 Document Reviewed: 09/08/2019 Elsevier Patient Education  2021 [place]. https://www.diabeteseducator.org/docs/default-source/living-with-diabetes/conquering-the-grocery-store-v1.pdf?sfvrsn=4">  Carbohydrate Counting for Diabetes Mellitus, Adult Carbohydrate counting is a method of keeping track of how many carbohydrates you eat. Eating carbohydrates naturally increases the amount of sugar (glucose) in the blood. Counting how many carbohydrates you eat improves your blood glucose control, which helps you manage your diabetes. It is important to know how many carbohydrates you can safely have in each meal. This is different for every person. A dietitian can help you make a meal plan and calculate how many carbohydrates you should have at each meal and snack. What foods contain carbohydrates? Carbohydrates are found in the following foods:  Grains, such as breads and cereals.  Dried beans and soy products.  Starchy vegetables, such as potatoes, peas, and corn.  Fruit and fruit juices.  Milk and yogurt.  Sweets and snack foods, such as cake, cookies, candy, chips, and soft drinks.   How do I count carbohydrates in foods? There are two ways to count carbohydrates in food. You can read  food labels or learn standard serving sizes of foods. You can use either of the methods or a combination of both. Using the Nutrition Facts label The Nutrition Facts list is included on the labels of almost all packaged foods and beverages in the [place] It includes:  The serving size.  Information about nutrients in each serving, including the grams (g) of carbohydrate per serving. To use the Nutrition Facts:  Decide how many servings you will have.  Multiply the number of servings by the number of carbohydrates per serving.  The resulting number is the total amount of carbohydrates that you will be having. Learning the standard serving sizes of foods When you eat carbohydrate foods that are not packaged or do not include Nutrition Facts on the label, you need to measure  the servings in order to count the amount of carbohydrates.  Measure the foods that you will eat with a food scale or measuring cup, if needed.  Decide how many standard-size servings you will eat.  Multiply the number of servings by 15. For foods that contain carbohydrates, one serving equals 15 g of carbohydrates. ? For example, if you eat 2 cups or 10 oz (300 g) of strawberries, you will have eaten 2 servings and 30 g of carbohydrates (2 servings x 15 g = 30 g).  For foods that have more than one food mixed, such as soups and casseroles, you must count the carbohydrates in each food that is included. The following list contains standard serving sizes of common carbohydrate-rich foods. Each of these servings has about 15 g of carbohydrates:  1 slice of bread.  1 six-inch (15 cm) tortilla.  ? cup or 2 oz (53 g) cooked rice or pasta.   cup or 3 oz (85 g) cooked or canned, drained and rinsed beans or lentils.   cup or 3 oz (85 g) starchy vegetable, such as peas, corn, or squash.   cup or 4 oz (120 g) hot cereal.   cup or 3 oz (85 g) boiled or mashed potatoes, or  or 3 oz (85 g) of a large baked potato.   cup or 4 fl oz (118 mL) fruit juice.  1 cup or 8 fl oz (237 mL) milk.  1 small or 4 oz (106 g) apple.   or 2 oz (63 g) of a medium banana.  1 cup or 5 oz (150 g) strawberries.  3 cups or 1 oz (24 g) popped popcorn. What is an example of carbohydrate counting? To calculate the number of carbohydrates in this sample meal, follow the steps shown below. Sample meal  3 oz (85 g) chicken breast.  ? cup or 4 oz (106 g) brown rice.   cup or 3 oz (85 g) corn.  1 cup or 8 fl oz (237 mL) milk.  1 cup or 5 oz (150 g) strawberries with sugar-free whipped topping. Carbohydrate calculation 1. Identify the foods that contain carbohydrates: ? Rice. ? Corn. ? Milk. ? Strawberries. 2. Calculate how many servings you have of each food: ? 2 servings rice. ? 1 serving  corn. ? 1 serving milk. ? 1 serving strawberries. 3. Multiply each number of servings by 15 g: ? 2 servings rice x 15 g = 30 g. ? 1 serving corn x 15 g = 15 g. ? 1 serving milk x 15 g = 15 g. ? 1 serving strawberries x 15 g = 15 g. 4. Add together all of the amounts to find the  total grams of carbohydrates eaten: ? 30 g + 15 g + 15 g + 15 g = 75 g of carbohydrates total. What are tips for following this plan? Shopping  Develop a meal plan and then make a shopping list.  Buy fresh and frozen vegetables, fresh and frozen fruit, dairy, eggs, beans, lentils, and whole grains.  Look at food labels. Choose foods that have more fiber and less sugar.  Avoid processed foods and foods with added sugars. Meal planning  Aim to have the same amount of carbohydrates at each meal and for each snack time.  Plan to have regular, balanced meals and snacks. Where to find more information  American Diabetes Association: www.diabetes.org  Centers for Disease Control and Prevention: http://www.wolf.info/ Summary  Carbohydrate counting is a method of keeping track of how many carbohydrates you eat.  Eating carbohydrates naturally increases the amount of sugar (glucose) in the blood.  Counting how many carbohydrates you eat improves your blood glucose control, which helps you manage your diabetes.  A dietitian can help you make a meal plan and calculate how many carbohydrates you should have at each meal and snack. This information is not intended to replace advice given to you by your health care provider. Make sure you discuss any questions you have with your health care provider. Document Revised: 09/27/2019 Document Reviewed: 09/28/2019 Elsevier Patient Education  2021 [employer].

## 2021-01-12 ENCOUNTER — Other Ambulatory Visit: Payer: Self-pay

## 2021-01-12 MED FILL — Metformin HCl Tab 500 MG: ORAL | 30 days supply | Qty: 30 | Fill #0 | Status: AC

## 2021-01-13 ENCOUNTER — Other Ambulatory Visit: Payer: Self-pay

## 2021-01-19 ENCOUNTER — Other Ambulatory Visit: Payer: Self-pay

## 2021-01-20 ENCOUNTER — Telehealth: Payer: Self-pay

## 2021-01-20 NOTE — Telephone Encounter (Signed)
Called pt and they said they already turned in tax return.

## 2021-01-21 ENCOUNTER — Ambulatory Visit: Payer: Self-pay | Admitting: Licensed Clinical Social Worker

## 2021-01-21 ENCOUNTER — Institutional Professional Consult (permissible substitution): Payer: Self-pay | Admitting: Licensed Clinical Social Worker

## 2021-01-21 ENCOUNTER — Other Ambulatory Visit: Payer: Self-pay

## 2021-01-21 DIAGNOSIS — Z7689 Persons encountering health services in other specified circumstances: Secondary | ICD-10-CM
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
## 2021-01-22 ENCOUNTER — Telehealth: Payer: Self-pay | Admitting: Licensed Clinical Social Worker

## 2021-01-22 ENCOUNTER — Other Ambulatory Visit: Payer: Self-pay

## 2021-01-22 ENCOUNTER — Other Ambulatory Visit: Payer: Self-pay | Admitting: Gerontology

## 2021-01-22 DIAGNOSIS — F331 Major depressive disorder, recurrent, moderate: Secondary | ICD-10-CM

## 2021-01-22 MED ORDER — MIRTAZAPINE 15 MG PO TABS
7.5000 mg | ORAL_TABLET | Freq: Every day | ORAL | 0 refills | Status: DC
  Filled 2021-01-22: qty 15, 30d supply, fill #0

## 2021-01-22 NOTE — Telephone Encounter (Signed)
Spoke to the patient and informed her of the [facility] case consultation recommendations and explained her options. I informed her that Carlyon Shadow, NP will send her prescription to [facility].

## 2021-01-23 ENCOUNTER — Other Ambulatory Visit: Payer: Self-pay

## 2021-01-27 ENCOUNTER — Other Ambulatory Visit: Payer: Self-pay

## 2021-01-29 ENCOUNTER — Telehealth (INDEPENDENT_AMBULATORY_CARE_PROVIDER_SITE_OTHER): Payer: Self-pay | Admitting: Gastroenterology

## 2021-01-29 ENCOUNTER — Encounter: Payer: Self-pay | Admitting: Gastroenterology

## 2021-01-29 ENCOUNTER — Other Ambulatory Visit: Payer: Self-pay | Admitting: *Deleted

## 2021-01-29 DIAGNOSIS — Z1211 Encounter for screening for malignant neoplasm of colon: Secondary | ICD-10-CM

## 2021-01-29 MED ORDER — NA SULFATE-K SULFATE-MG SULF 17.5-3.13-1.6 GM/177ML PO SOLN: 1.0000 | Freq: Once | ORAL | 0 refills | Status: AC

## 2021-01-29 NOTE — Progress Notes (Signed)
Gastroenterology Pre-Procedure Review  Request Date: 02/11/2021   Requesting Physician: Dr. Vicente Males   PATIENT REVIEW QUESTIONS: The patient responded to the following health history questions as indicated:    1. Are you having any GI issues? no 2. Do you have a personal history of Polyps? no 3. Do you have a family history of Colon Cancer or Polyps? no 4. Diabetes Mellitus? no however is prediabetic  5. Joint replacements in the past 12 months?no 6. Major health problems in the past 3 months?no 7. Any artificial heart valves, MVP, or defibrillator?no    MEDICATIONS & ALLERGIES:    Patient reports the following regarding taking any anticoagulation/antiplatelet therapy:   Plavix, Coumadin, Eliquis, Xarelto, Lovenox, Pradaxa, Brilinta, or Effient? no Aspirin? no  Patient confirms/reports the following medications:  Current Outpatient Medications  Medication Sig Dispense Refill  . metFORMIN (GLUCOPHAGE) 500 MG tablet TAKE ONE TABLET BY MOUTH EVERY MORNING WITH BREAKFAST 30 tablet 2  . mirtazapine (REMERON) 15 MG tablet Take 0.5 tablets (7.5 mg total) by mouth at bedtime. 15 tablet 0  . rosuvastatin (CRESTOR) 10 MG tablet TAKE ONE TABLET BY MOUTH EVERY DAY 30 tablet 1   No current facility-administered medications for this visit.    Patient confirms/reports the following allergies:  No Known Allergies  No orders of the defined types were placed in this encounter.   AUTHORIZATION INFORMATION Primary Insurance: 1D#: Group #:  Secondary Insurance: 1D#: Group #:  SCHEDULE INFORMATION: Date: 02/11/2021 Time: Location: [place]

## 2021-01-30 ENCOUNTER — Other Ambulatory Visit: Payer: Self-pay

## 2021-01-30 ENCOUNTER — Ambulatory Visit: Payer: Self-pay | Admitting: Licensed Clinical Social Worker

## 2021-01-30 DIAGNOSIS — F321 Major depressive disorder, single episode, moderate: Secondary | ICD-10-CM

## 2021-01-30 NOTE — BH Specialist Note (Signed)
Integrated Behavioral Health Follow Up In-Person Visit  MRN: [id number] Name: Allene Furuya  Total time: 30 minutes  Types of Service: Telephone visit  Patient consents to telephone visit and 2 patient identifiers were used to identify patient  Interpretor:No. Interpretor Name and Language:   Subjective: Kari Montero is a 60 y.o. female accompanied by herself Patient was referred by Carlyon Shadow, NP for mental health Patient reports the following symptoms/concerns: The patient stated that she has been doing about the same since her last follow session. She notes that she was able to get her prescription filled from [facility] and she has been taking Mirtazapine 7.5 MG before she goes to bed every night. She stated that she is able to fall asleep easily now, but feels more run down during the day. She notes that her overall mood has been angry lately. Patient denies suicidal or homicidal thoughts.  Duration of problem: ; Severity of problem: moderate  Objective: Mood: Euthymic and Affect: Appropriate Risk of harm to self or others: No plan to harm self or others  Life Context: Family and Social: see above School/Work: see above Self-Care: see above  Life Changes: see above  Patient and/or Family's Strengths/Protective Factors: Concrete supports in place (healthy food, safe environments, etc.)  Goals Addressed: Patient will: 1.  Reduce symptoms of: agitation, anxiety, depression and insomnia  2.  Increase knowledge and/or ability of: coping skills, healthy habits and self-management skills  3.  Demonstrate ability to: Increase healthy adjustment to current life circumstances  Progress towards Goals: Ongoing  Interventions: Interventions utilized:  CBT Cognitive Behavioral Therapy was utilized by the clinician during today's follow up session. The clinician processed with the patient how they have been doing since the last follow-up session. The clinician provided a  space for the patient to ventilate their frustrations regarding their current life circumstances. Clinician measured the patient's anxiety and depression on a numerical scale. The clinician encouraged the patient to utilize their coping skills to deal with their current life circumstances. Clinician explained to the patient that she could seek care at [facility] and provided there location. Additionally clinician explained that should the patient experience a crisis to call [facility] crisis line, go to [facility] walk-in or visit the closest emergency department for assistance.  Standardized Assessments completed: GAD-7 and PHQ 9  GAD-7     8 PHQ-9    7   Patient and/or Family Response:  Patient was agreeable to the plans and treatment recommendations.  Patient Centered Plan: Patient is on the following Treatment Plan(s): The patient will follow up with [facility] as needed and understand during a crisis she can call [facility]'s crisis line or walk in or visit the local emergency department. Clinician will call patient in May to schedule follow-up.  Assessment: Patient currently experiencing see above  Patient may benefit from see above  Plan: 1. Follow up with behavioral health clinician on : see above 2. Behavioral recommendations:  3. Referral(s): [place] (In Clinic) and [place] (LME/Outside Clinic) 4. "From scale of 1-10, how likely are you to follow plan?":   Lesli Albee, Student-Social Work

## 2021-02-02 ENCOUNTER — Other Ambulatory Visit: Payer: Self-pay

## 2021-02-02 MED ORDER — PEG 3350-KCL-NABCB-NACL-NASULF 236 G PO SOLR
ORAL | 0 refills | Status: DC
  Filled 2021-02-02: qty 4000, 1d supply, fill #0

## 2021-02-10 ENCOUNTER — Encounter: Payer: Self-pay | Admitting: Gastroenterology

## 2021-02-11 ENCOUNTER — Ambulatory Visit
Admission: RE | Admit: 2021-02-11 | Discharge: 2021-02-11 | Disposition: A | Discharge status: Home / Self Care | Payer: Self-pay | Attending: Gastroenterology | Admitting: Gastroenterology

## 2021-02-11 ENCOUNTER — Encounter
Admission: RE | Disposition: A | Discharge status: Home / Self Care | Payer: Self-pay | Source: Home / Self Care | Attending: Gastroenterology

## 2021-02-11 ENCOUNTER — Telehealth: Payer: Self-pay

## 2021-02-11 DIAGNOSIS — Z1211 Encounter for screening for malignant neoplasm of colon: Secondary | ICD-10-CM

## 2021-02-11 HISTORY — DX: Urinary tract infection, site not specified: N39.0

## 2021-02-11 HISTORY — DX: Depression, unspecified: F32.A

## 2021-02-11 HISTORY — DX: Duodenitis without bleeding: K29.80

## 2021-02-11 SURGERY — COLONOSCOPY WITH PROPOFOL
Anesthesia: General

## 2021-02-11 MED ORDER — SODIUM CHLORIDE 0.9 % IV SOLN: INTRAVENOUS | Status: DC

## 2021-02-11 NOTE — Telephone Encounter (Signed)
Patient showed up for procedure but didn't follow any of the Prep directions. Endo canceled her procedure and asked [place] to call her for clarity.

## 2021-02-11 NOTE — Progress Notes (Signed)
Patient arrived for today's scheduled Colonoscopy with Dr Vicente Males.  Upon questioning, the patient did not have clear liquids the day before nor did she drink the colon prep.  Patient states she was unaware of needing to drink the colon prep or where she was supposed to obtain the colon prep.  Dr Georgeann Oppenheim office notified that the patient is not prepared for her Colonoscopy today and that she needs to be rescheduled.  Dr Vicente Males also notified of this information.  Instructions provided to the patient of rescheduling the procedure and the anticipated prep for her Colonoscopy as well as Dr Georgeann Oppenheim office phone number.  She verbalized understanding.

## 2021-02-12 NOTE — Telephone Encounter (Signed)
Called patient to have procedure rescheduled. LVM to call office back.

## 2021-02-25 ENCOUNTER — Telehealth: Payer: Self-pay | Admitting: Gastroenterology

## 2021-02-25 NOTE — Telephone Encounter (Signed)
Patient called, wants call back to reschedule her procedure.

## 2021-02-27 NOTE — Telephone Encounter (Signed)
Patient has rescheduled procedure for 6/14. I will mail instructions to her home and bowel prep will be sent to pharmacy of choice. Pt still seemed a little confused about procedure preparation.

## 2021-03-02 ENCOUNTER — Other Ambulatory Visit: Payer: Self-pay

## 2021-03-02 DIAGNOSIS — Z1211 Encounter for screening for malignant neoplasm of colon: Secondary | ICD-10-CM

## 2021-03-02 MED ORDER — PEG 3350-KCL-NA BICARB-NACL 420 G PO SOLR
4000.0000 mL | Freq: Once | ORAL | 0 refills | Status: AC
  Filled 2021-03-02: qty 4000, 1d supply, fill #0

## 2021-03-04 ENCOUNTER — Other Ambulatory Visit: Payer: Self-pay

## 2021-03-10 ENCOUNTER — Ambulatory Visit: Payer: Self-pay | Admitting: Gerontology

## 2021-03-12 ENCOUNTER — Ambulatory Visit: Payer: Self-pay | Admitting: Gerontology

## 2021-03-19 ENCOUNTER — Ambulatory Visit: Payer: Self-pay | Admitting: Gerontology

## 2021-03-19 ENCOUNTER — Encounter: Payer: Self-pay | Admitting: Gerontology

## 2021-03-19 ENCOUNTER — Other Ambulatory Visit: Payer: Self-pay

## 2021-03-19 VITALS — BP 135/83 | HR 61 | Temp 97.9°F | Resp 18 | Ht 67.0 in | Wt 156.5 lb

## 2021-03-19 DIAGNOSIS — R7303 Prediabetes: Secondary | ICD-10-CM

## 2021-03-19 DIAGNOSIS — E785 Hyperlipidemia, unspecified: Secondary | ICD-10-CM

## 2021-03-19 DIAGNOSIS — Z0000 Encounter for general adult medical examination without abnormal findings: Secondary | ICD-10-CM | POA: Insufficient documentation

## 2021-03-19 DIAGNOSIS — F321 Major depressive disorder, single episode, moderate: Secondary | ICD-10-CM

## 2021-03-19 MED ORDER — METFORMIN HCL 500 MG PO TABS
ORAL_TABLET | ORAL | 3 refills | Status: DC
  Filled 2021-03-19: qty 30, 30d supply, fill #0
  Filled 2021-05-12: qty 90, 90d supply, fill #1

## 2021-03-19 MED ORDER — ROSUVASTATIN CALCIUM 10 MG PO TABS
ORAL_TABLET | Freq: Every day | ORAL | 3 refills | Status: DC
  Filled 2021-03-19: qty 30, 30d supply, fill #0
  Filled 2021-05-12: qty 90, 90d supply, fill #1

## 2021-03-19 NOTE — Progress Notes (Signed)
Established Patient Office Visit  Subjective:  Patient ID: Stacie Kennedy, female    DOB: 05/14/1961  Age: 60 y.o. MRN: [id number]  CC:  Chief Complaint  Patient presents with   Follow-up    HPI Stacie Kennedy is a 60 year old female with a PMH of Depression, prediabetes who presents for routine follow up and medication refill. She reports compliance with her medication regimen and continues to make healthy lifestyle choices. She denies hypo/hyperglycemic symptoms, peripheral neuropathy  and performs daily foot checks. Her blood glucose during office visit 101 mg/dl.  She reports that her mood is good and well improved, denies suicidal nor homicidal ideation. She states that she does not want to follow up with Behavioral health nor talk to anyone concerning her depression, stating that she is doing better at this time. She is scheduled for colonoscopy 03/24/21. Overall, she states that she is doing well and offers no further complaints.  Past Medical History:  Diagnosis Date   Cancer ([place])    lung cancer   Depression    Duodenitis    High cholesterol    Pre-diabetes    Umbilical hernia    UTI (urinary tract infection)     Past Surgical History:  Procedure Laterality Date   INTERCOSTAL NERVE BLOCK  09/19/2020   Procedure: INTERCOSTAL NERVE BLOCK;  Surgeon: Melrose Nakayama, MD;  Location: [facility] OR;  Service: Thoracic;;   LUNG LOBECTOMY Right    LUNG REMOVAL, PARTIAL Right 09/19/2020   NODE DISSECTION Right 09/19/2020   Procedure: NODE DISSECTION;  Surgeon: Melrose Nakayama, MD;  Location: [facility] OR;  Service: Thoracic;  Laterality: Right;    Family History  Problem Relation Age of Onset   Diabetes Mother    Cancer Mother    Diabetes Father    Stroke Father    Heart attack Father        37s   Healthy Sister    Cancer Brother    Hepatitis Brother    Diabetes Brother    Hypertension Brother    Heart disease Brother     Social History   Socioeconomic History    Marital status: Single    Spouse name: Not on file   Number of children: Not on file   Years of education: Not on file   Highest education level: Not on file  Occupational History   Not on file  Tobacco Use   Smoking status: Every Day    Packs/day: 0.25    Years: 43.00    Pack years: 10.75    Types: Cigarettes   Smokeless tobacco: Never   Tobacco comments:    states she is slowing, trying to quit  Vaping Use   Vaping Use: Never used  Substance and Sexual Activity   Alcohol use: Yes    Comment: 1 quart of beer weekly   Drug use: No   Sexual activity: Not on file  Other Topics Concern   Not on file  Social History Narrative   Lives in [place]; smokes; now and then beer; with boy friend. 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    Social Determinants of Health   Financial Resource Strain: High Risk   Difficulty of Paying Living Expenses: Hard  Food Insecurity: Food Insecurity Present   Worried About Running Out of Food in the Last Year: Sometimes true   Ran Out of Food in the Last Year: Sometimes true  Transportation Needs: No Transportation Needs   Lack of Transportation (Medical): No  Lack of Transportation (Non-Medical): No  Physical Activity: Sufficiently Active   Days of Exercise per Week: 5 days   Minutes of Exercise per Session: 60 min  Stress: Stress Concern Present   Feeling of Stress : Very much  Social Connections: Socially Isolated   Frequency of Communication with Friends and Family: Never   Frequency of Social Gatherings with Friends and Family: Never   Attends Religious Services: Never   Marine scientist or Organizations: No   Attends Music therapist: Never   Marital Status: Never married  Human resources officer Violence: Not At Risk   Fear of Current or Ex-Partner: No   Emotionally Abused: No   Physically Abused: No   Sexually Abused: No    Outpatient Medications Prior to Visit  Medication Sig Dispense Refill    mirtazapine (REMERON) 15 MG tablet Take 0.5 tablets (7.5 mg total) by mouth at bedtime. 15 tablet 0   metFORMIN (GLUCOPHAGE) 500 MG tablet TAKE ONE TABLET BY MOUTH EVERY MORNING WITH BREAKFAST 30 tablet 2   rosuvastatin (CRESTOR) 10 MG tablet TAKE ONE TABLET BY MOUTH EVERY DAY 30 tablet 1   polyethylene glycol (GOLYTELY) 236 g solution Take once by mouth as directed 4000 mL 0   No facility-administered medications prior to visit.    No Known Allergies  ROS Review of Systems  Constitutional: Negative.   HENT: Negative.    Eyes: Negative.   Gastrointestinal: Negative.   Endocrine: Negative.   Skin: Negative.   Neurological: Negative.   Psychiatric/Behavioral: Negative.       Objective:    Physical Exam Constitutional:      Appearance: Normal appearance.  HENT:     Head: Normocephalic.  Cardiovascular:     Rate and Rhythm: Regular rhythm.     Pulses: Normal pulses.     Heart sounds: Normal heart sounds.  Pulmonary:     Effort: Pulmonary effort is normal.  Abdominal:     General: Bowel sounds are normal.     Palpations: Abdomen is soft.  Skin:    General: Skin is warm and dry.     Capillary Refill: Capillary refill takes less than 2 seconds.  Neurological:     General: No focal deficit present.     Mental Status: She is alert and oriented to person, place, and time.  Psychiatric:        Mood and Affect: Mood normal.    BP 135/83 (BP Location: Left Arm, Patient Position: Sitting, Cuff Size: Normal)   Pulse 61   Temp 97.9 F (36.6 C)   Resp 18   Ht 5\' 7"  (1.702 m)   Wt 156 lb 8 oz (71 kg)   SpO2 94%   BMI 24.51 kg/m  Wt Readings from Last 3 Encounters:  03/19/21 156 lb 8 oz (71 kg)  01/01/21 157 lb 4.8 oz (71.4 kg)  12/04/20 158 lb 6.4 oz (71.8 kg)     Health Maintenance Due  Topic Date Due   Pneumococcal Vaccine 15-56 Years old (1 - PCV) Never done   HIV Screening  Never done   Hepatitis C Screening  Never done   TETANUS/TDAP  Never done   Zoster  Vaccines- Shingrix (1 of 2) Never done   PAP SMEAR-Modifier  Never done   COLONOSCOPY (Pts 45-28yrs Insurance coverage will need to be confirmed)  Never done   MAMMOGRAM  Never done   COVID-19 Vaccine (3 - Moderna risk series) 05/06/2020    There are no  preventive care reminders to display for this patient.  No results found for: TSH Lab Results  Component Value Date   WBC 6.9 11/26/2020   HGB 15.5 11/26/2020   HCT 47.6 (H) 11/26/2020   MCV 87 11/26/2020   PLT 317 11/26/2020   Lab Results  Component Value Date   NA 139 11/26/2020   K 4.1 11/26/2020   CO2 22 11/26/2020   GLUCOSE 99 11/26/2020   BUN 8 11/26/2020   CREATININE 0.69 11/26/2020   BILITOT 0.3 11/26/2020   ALKPHOS 113 11/26/2020   AST 18 11/26/2020   ALT 9 11/26/2020   PROT 6.9 11/26/2020   ALBUMIN 4.0 11/26/2020   CALCIUM 9.5 11/26/2020   ANIONGAP 5 09/21/2020   Lab Results  Component Value Date   CHOL 157 11/26/2020   Lab Results  Component Value Date   HDL 44 11/26/2020   Lab Results  Component Value Date   LDLCALC 94 11/26/2020   Lab Results  Component Value Date   TRIG 105 11/26/2020   Lab Results  Component Value Date   CHOLHDL 3.6 11/26/2020   Lab Results  Component Value Date   HGBA1C 6.2 (H) 11/26/2020      Assessment & Plan:   1. Prediabetes Continue current medication regimen Continue low carb count diet and exercise as tolerated Will recheck HgbA1c in 3 months - metFORMIN (GLUCOPHAGE) 500 MG tablet; TAKE ONE TABLET BY MOUTH EVERY MORNING WITH BREAKFAST  Dispense: 30 tablet; Refill: 3  2. Elevated lipids Continue on current medication Last Lab showed marked improvement Continue on Low fat/low cholesterol diet and exercise as tolerated - rosuvastatin (CRESTOR) 10 MG tablet; TAKE ONE TABLET BY MOUTH EVERY DAY  Dispense: 30 tablet; Refill: 3  3. Current moderate episode of major depressive disorder, unspecified whether recurrent ([place]) Mood stable No suicidal/homicidal  ideation reported To call crisis line or go to the ED if any SI/HI develops  4. Health care maintenance Colonoscopy scheduled for 03/24/21 Collect [facility] flier for appointment for mammogram      Follow-up: Return in about 3 months (around 06/24/2021), or if symptoms worsen or fail to improve.    Danella Maiers, RN

## 2021-03-24 ENCOUNTER — Encounter
Admission: RE | Disposition: A | Discharge status: Home / Self Care | Payer: Self-pay | Source: Home / Self Care | Attending: Gastroenterology

## 2021-03-24 ENCOUNTER — Ambulatory Visit: Payer: Medicaid Other | Admitting: Certified Registered"

## 2021-03-24 ENCOUNTER — Ambulatory Visit
Admission: RE | Admit: 2021-03-24 | Discharge: 2021-03-24 | Disposition: A | Discharge status: Home / Self Care | Payer: Medicaid Other | Attending: Gastroenterology | Admitting: Gastroenterology

## 2021-03-24 ENCOUNTER — Encounter: Payer: Self-pay | Admitting: Gastroenterology

## 2021-03-24 DIAGNOSIS — D124 Benign neoplasm of descending colon: Secondary | ICD-10-CM | POA: Diagnosis not present

## 2021-03-24 DIAGNOSIS — K635 Polyp of colon: Secondary | ICD-10-CM

## 2021-03-24 DIAGNOSIS — Z7984 Long term (current) use of oral hypoglycemic drugs: Secondary | ICD-10-CM | POA: Diagnosis not present

## 2021-03-24 DIAGNOSIS — F17210 Nicotine dependence, cigarettes, uncomplicated: Secondary | ICD-10-CM | POA: Diagnosis not present

## 2021-03-24 DIAGNOSIS — D123 Benign neoplasm of transverse colon: Secondary | ICD-10-CM | POA: Insufficient documentation

## 2021-03-24 DIAGNOSIS — K640 First degree hemorrhoids: Secondary | ICD-10-CM | POA: Diagnosis not present

## 2021-03-24 DIAGNOSIS — Z79899 Other long term (current) drug therapy: Secondary | ICD-10-CM | POA: Diagnosis not present

## 2021-03-24 DIAGNOSIS — D128 Benign neoplasm of rectum: Secondary | ICD-10-CM | POA: Diagnosis not present

## 2021-03-24 DIAGNOSIS — Z1211 Encounter for screening for malignant neoplasm of colon: Secondary | ICD-10-CM | POA: Insufficient documentation

## 2021-03-24 DIAGNOSIS — D122 Benign neoplasm of ascending colon: Secondary | ICD-10-CM | POA: Insufficient documentation

## 2021-03-24 HISTORY — PX: COLONOSCOPY WITH PROPOFOL: SHX5780

## 2021-03-24 SURGERY — COLONOSCOPY WITH PROPOFOL
Anesthesia: General

## 2021-03-24 MED ORDER — LIDOCAINE HCL (CARDIAC) PF 100 MG/5ML IV SOSY
PREFILLED_SYRINGE | INTRAVENOUS | Status: DC | PRN
  Administered 2021-03-24: 50 mg via INTRAVENOUS

## 2021-03-24 MED ORDER — PROPOFOL 500 MG/50ML IV EMUL
INTRAVENOUS | Status: AC
  Filled 2021-03-24: qty 50

## 2021-03-24 MED ORDER — PROPOFOL 500 MG/50ML IV EMUL
INTRAVENOUS | Status: DC | PRN
  Administered 2021-03-24: 150 ug/kg/min via INTRAVENOUS

## 2021-03-24 MED ORDER — PROPOFOL 10 MG/ML IV BOLUS
INTRAVENOUS | Status: DC | PRN
  Administered 2021-03-24: 50 mg via INTRAVENOUS

## 2021-03-24 MED ORDER — SODIUM CHLORIDE 0.9 % IV SOLN: INTRAVENOUS | Status: DC

## 2021-03-24 NOTE — Anesthesia Procedure Notes (Signed)
Procedure Name: MAC Date/Time: 03/24/2021 10:18 AM Performed by: Jerrye Noble, CRNA Pre-anesthesia Checklist: Patient identified, Emergency Drugs available, Suction available and Patient being monitored Patient Re-evaluated:Patient Re-evaluated prior to induction Oxygen Delivery Method: Nasal cannula

## 2021-03-24 NOTE — Op Note (Signed)
[facility] Patient Name: Stacie Kennedy Procedure Date: 03/24/2021 10:07 AM MRN: [id number] Account #: [id number] Date of Birth: 14-May-1961 Admit Type: Outpatient Age: 60 Room: [facility] ENDO ROOM 2 Gender: Female Note Status: Finalized Procedure:             Colonoscopy Indications:           Screening for colorectal malignant neoplasm Providers:             Jonathon Bellows MD, MD Medicines:             Monitored Anesthesia Care Complications:         No immediate complications. Procedure:             Pre-Anesthesia Assessment:                        - Prior to the procedure, a History and Physical was                         performed, and patient medications, allergies and                         sensitivities were reviewed. The patient's tolerance                         of previous anesthesia was reviewed.                        - The risks and benefits of the procedure and the                         sedation options and risks were discussed with the                         patient. All questions were answered and informed                         consent was obtained.                        - ASA Grade Assessment: II - A patient with mild                         systemic disease.                        After obtaining informed consent, the colonoscope was                         passed under direct vision. Throughout the procedure,                         the patient's blood pressure, pulse, and oxygen                         saturations were monitored continuously. The                         Colonoscope was introduced through the anus and  advanced to the the cecum, identified by the                         appendiceal orifice. The colonoscopy was technically                         difficult and complex due to the patient's respiratory                         instability (respiratory distress). The patient                          tolerated the procedure well. The quality of the bowel                         preparation was good. Findings:      The perianal and digital rectal examinations were normal.      Non-bleeding internal hemorrhoids were found during retroflexion. The       hemorrhoids were large and Grade I (internal hemorrhoids that do not       prolapse).      A 12 mm polyp was found in the rectum. The polyp was semi-pedunculated.       The polyp was removed with a hot snare. Resection and retrieval were       complete.      Five sessile polyps were found in the ascending colon. The polyps were 5       to 7 mm in size. These polyps were removed with a cold snare. Resection       and retrieval were complete.      Three sessile polyps were found in the transverse colon. The polyps were       4 to 6 mm in size. These polyps were removed with a cold snare.       Resection and retrieval were complete.      Three sessile polyps were found in the descending colon. The polyps were       4 to 6 mm in size. These polyps were removed with a cold snare.       Resection and retrieval were complete. Impression:            - Non-bleeding internal hemorrhoids.                        - One 12 mm polyp in the rectum, removed with a hot                         snare. Resected and retrieved.                        - Five 5 to 7 mm polyps in the ascending colon,                         removed with a cold snare. Resected and retrieved.                        - Three 4 to 6 mm polyps in the transverse colon,  removed with a cold snare. Resected and retrieved.                        - Three 4 to 6 mm polyps in the descending colon,                         removed with a cold snare. Resected and retrieved. Recommendation:        - Await pathology results.                        - Discharge patient to home (with escort).                        - Resume previous diet.                        - Continue  present medications.                        - Repeat colonoscopy in 1 year for surveillance. Procedure Code(s):     --- Professional ---                        [phone], Colonoscopy, flexible; with removal of                         tumor(s), polyp(s), or other lesion(s) by snare                         technique Diagnosis Code(s):     --- Professional ---                        Z12.11, Encounter for screening for malignant neoplasm                         of colon                        K62.1, Rectal polyp                        K63.5, Polyp of colon                        K64.0, First degree hemorrhoids CPT copyright 2019 American Medical Association. All rights reserved. The codes documented in this report are preliminary and upon coder review may  be revised to meet current compliance requirements. Jonathon Bellows, MD Jonathon Bellows MD, MD 03/24/2021 10:47:00 AM This report has been signed electronically. Number of Addenda: 0 Note Initiated On: 03/24/2021 10:07 AM Scope Withdrawal Time: 0 hours 14 minutes 43 seconds  Total Procedure Duration: 0 hours 21 minutes 47 seconds  Estimated Blood Loss:  Estimated blood loss: none.      [facility]

## 2021-03-24 NOTE — Anesthesia Preprocedure Evaluation (Signed)
Anesthesia Evaluation  Patient identified by MRN, date of birth, ID band Patient awake  General Assessment Comment: S/p thoracotomy + lobectomy for lung cancer 09/2020  Reviewed: Allergy & Precautions, NPO status , Patient's Chart, lab work & pertinent test results  History of Anesthesia Complications Negative for: history of anesthetic complications  Airway Mallampati: III  TM Distance: >3 FB Neck ROM: Full    Dental no notable dental hx. (+) Poor Dentition, Missing,    Pulmonary shortness of breath and at rest, neg sleep apnea, neg COPD, Current Smoker and Patient abstained from smoking.,     + decreased breath sounds      Cardiovascular Exercise Tolerance: Poor METS(-) hypertension(-) CAD and (-) Past MI (-) dysrhythmias  Rhythm:Regular Rate:Normal - Systolic murmurs    Neuro/Psych PSYCHIATRIC DISORDERS Depression negative neurological ROS     GI/Hepatic neg GERD  ,(+)     (-) substance abuse  ,   Endo/Other  neg diabetes  Renal/GU negative Renal ROS     Musculoskeletal   Abdominal   Peds  Hematology   Anesthesia Other Findings Past Medical History: No date: Cancer ([place])     Comment:  lung cancer No date: Depression No date: Duodenitis No date: High cholesterol No date: Pre-diabetes No date: Umbilical hernia No date: UTI (urinary tract infection)  Reproductive/Obstetrics                             Anesthesia Physical Anesthesia Plan  ASA: 3  Anesthesia Plan: General   Post-op Pain Management:    Induction: Intravenous  PONV Risk Score and Plan: 2 and Ondansetron, Propofol infusion and TIVA  Airway Management Planned: Nasal Cannula  Additional Equipment: None  Intra-op Plan:   Post-operative Plan:   Informed Consent: I have reviewed the patients History and Physical, chart, labs and discussed the procedure including the risks, benefits and alternatives for the  proposed anesthesia with the patient or authorized representative who has indicated his/her understanding and acceptance.     Dental advisory given  Plan Discussed with: CRNA and Surgeon  Anesthesia Plan Comments: (Discussed risks of anesthesia with patient, including possibility of difficulty with spontaneous ventilation under anesthesia necessitating airway intervention, PONV, and rare risks such as cardiac or respiratory or neurological events. Patient understands.)        Anesthesia Quick Evaluation

## 2021-03-24 NOTE — H&P (Signed)
Jonathon Bellows, MD [address], [place], [place], [place], [address] [address], [place], [place], [place], [address] Phone: [phone]  Fax: [phone]  Primary Care Physician:  Pcp, No   Pre-Procedure History & Physical: HPI:  Krishauna Schatzman is a 60 y.o. female is here for an colonoscopy.   Past Medical History:  Diagnosis Date   Cancer [facility])    lung cancer   Depression    Duodenitis    High cholesterol    Pre-diabetes    Umbilical hernia    UTI (urinary tract infection)     Past Surgical History:  Procedure Laterality Date   INTERCOSTAL NERVE BLOCK  09/19/2020   Procedure: INTERCOSTAL NERVE BLOCK;  Surgeon: Melrose Nakayama, MD;  Location: [place];  Service: Thoracic;;   LUNG LOBECTOMY Right    LUNG REMOVAL, PARTIAL Right 09/19/2020   NODE DISSECTION Right 09/19/2020   Procedure: NODE DISSECTION;  Surgeon: Melrose Nakayama, MD;  Location: [place];  Service: Thoracic;  Laterality: Right;    Prior to Admission medications   Medication Sig Start Date End Date Taking? Authorizing Provider  metFORMIN (GLUCOPHAGE) 500 MG tablet TAKE ONE TABLET BY MOUTH EVERY MORNING WITH BREAKFAST 03/19/21 03/19/22 Yes Iloabachie, Chioma E, NP  mirtazapine (REMERON) 15 MG tablet Take 0.5 tablets (7.5 mg total) by mouth at bedtime. 01/22/21  Yes Iloabachie, Chioma E, NP  polyethylene glycol (GOLYTELY) 236 g solution Take once by mouth as directed 01/29/21  Yes Jonathon Bellows, MD  rosuvastatin (CRESTOR) 10 MG tablet TAKE ONE TABLET BY MOUTH EVERY DAY 03/19/21 03/19/22 Yes Iloabachie, Chioma E, NP    Allergies as of 03/02/2021   (No Known Allergies)    Family History  Problem Relation Age of Onset   Diabetes Mother    Cancer Mother    Diabetes Father    Stroke Father    Heart attack Father        55s   Healthy Sister    Cancer Brother    Hepatitis Brother    Diabetes Brother    Hypertension Brother    Heart disease Brother     Social History   Socioeconomic History    Marital status: Single    Spouse name: Not on file   Number of children: Not on file   Years of education: Not on file   Highest education level: Not on file  Occupational History   Not on file  Tobacco Use   Smoking status: Former    Packs/day: 0.25    Years: 43.00    Pack years: 10.75    Types: Cigarettes   Smokeless tobacco: Never   Tobacco comments:    states she is slowing, trying to quit  Vaping Use   Vaping Use: Never used  Substance and Sexual Activity   Alcohol use: Yes    Comment: 1 quart of beer weekly   Drug use: No   Sexual activity: Not on file  Other Topics Concern   Not on file  Social History Narrative   Lives in [place]; smokes; now and then beer; with boy friend. 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    Social Determinants of Health   Financial Resource Strain: High Risk   Difficulty of Paying Living Expenses: Hard  Food Insecurity: Food Insecurity Present   Worried About Running Out of Food in the Last Year: Sometimes true   Ran Out of Food in the Last Year: Sometimes true  Transportation Needs: No Transportation  Needs   Lack of Transportation (Medical): No   Lack of Transportation (Non-Medical): No  Physical Activity: Sufficiently Active   Days of Exercise per Week: 5 days   Minutes of Exercise per Session: 60 min  Stress: Stress Concern Present   Feeling of Stress : Very much  Social Connections: Socially Isolated   Frequency of Communication with Friends and Family: Never   Frequency of Social Gatherings with Friends and Family: Never   Attends Religious Services: Never   Marine scientist or Organizations: No   Attends Music therapist: Never   Marital Status: Never married  Human resources officer Violence: Not At Risk   Fear of Current or Ex-Partner: No   Emotionally Abused: No   Physically Abused: No   Sexually Abused: No    Review of Systems: See HPI, otherwise negative ROS  Physical Exam: BP 138/83    Pulse 67   Temp (!) 96.7 F (35.9 C) (Temporal)   Resp 18   Ht 5\' 7"  (1.702 m)   Wt 71.7 kg   SpO2 98%   BMI 24.75 kg/m  General:   Alert,  pleasant and cooperative in NAD Head:  Normocephalic and atraumatic. Neck:  Supple; no masses or thyromegaly. Lungs:  Clear throughout to auscultation, normal respiratory effort.    Heart:  +S1, +S2, Regular rate and rhythm, No edema. Abdomen:  Soft, nontender and nondistended. Normal bowel sounds, without guarding, and without rebound.   Neurologic:  Alert and  oriented x4;  grossly normal neurologically.  Impression/Plan: Kaitlynd Phillips is here for an colonoscopy to be performed for Screening colonoscopy average risk   Risks, benefits, limitations, and alternatives regarding  colonoscopy have been reviewed with the patient.  Questions have been answered.  All parties agreeable.   Jonathon Bellows, MD  03/24/2021, 10:09 AM

## 2021-03-24 NOTE — Transfer of Care (Signed)
Immediate Anesthesia Transfer of Care Note  Patient: Stacie Kennedy  Procedure(s) Performed: COLONOSCOPY WITH PROPOFOL  Patient Location: PACU and Endoscopy Unit  Anesthesia Type:General  Level of Consciousness: awake, drowsy and patient cooperative  Airway & Oxygen Therapy: Patient Spontanous Breathing  Post-op Assessment: Report given to RN and Post -op Vital signs reviewed and stable  Post vital signs: Reviewed and stable  Last Vitals:  Vitals Value Taken Time  BP 124/89 03/24/21 1048  Temp    Pulse 75 03/24/21 1048  Resp 22 03/24/21 1048  SpO2 100 % 03/24/21 1048    Last Pain:  Vitals:   03/24/21 0942  TempSrc: Temporal  PainSc: 0-No pain         Complications: No notable events documented.

## 2021-03-24 NOTE — Anesthesia Postprocedure Evaluation (Signed)
Anesthesia Post Note  Patient: Stacie Kennedy  Procedure(s) Performed: COLONOSCOPY WITH PROPOFOL  Patient location during evaluation: Endoscopy Anesthesia Type: General Level of consciousness: awake and alert Pain management: pain level controlled Vital Signs Assessment: post-procedure vital signs reviewed and stable Respiratory status: spontaneous breathing, nonlabored ventilation, respiratory function stable and patient connected to nasal cannula oxygen Cardiovascular status: blood pressure returned to baseline and stable Postop Assessment: no apparent nausea or vomiting Anesthetic complications: no   No notable events documented.   Last Vitals:  Vitals:   03/24/21 1058 03/24/21 1108  BP: 124/89 133/69  Pulse: (!) 58 (!) 54  Resp: 18 14  Temp:    SpO2: 100% 99%    Last Pain:  Vitals:   03/24/21 1108  TempSrc:   PainSc: 0-No pain                 Arita Miss

## 2021-03-25 ENCOUNTER — Encounter: Payer: Self-pay | Admitting: Gastroenterology

## 2021-03-25 LAB — SURGICAL PATHOLOGY

## 2021-03-26 ENCOUNTER — Telehealth: Payer: Self-pay | Admitting: Licensed Clinical Social Worker

## 2021-03-26 NOTE — Telephone Encounter (Signed)
Called pt regarding scheduling an appt with Stacie Kennedy and successfully scheduled an appt for 04/02/21 at 3 pm.

## 2021-04-01 ENCOUNTER — Telehealth: Payer: Self-pay

## 2021-04-01 DIAGNOSIS — Z86010 Personal history of colonic polyps: Secondary | ICD-10-CM

## 2021-04-01 NOTE — Telephone Encounter (Signed)
Called patient and told her the below information. Patient agreed on getting referred to the [facility] to speak with the genetic counselor.

## 2021-04-01 NOTE — Telephone Encounter (Signed)
-----   Message from Jonathon Bellows, MD sent at 03/31/2021 11:46 AM EDT ----- Inform   1. All polyps were pre cancerous repeat colonoscopy in 1 years as she had > 10 polyps 2. Refer to cancer center for genetic testing to find out reason why she has so mnay polyps   Dr Jonathon Bellows MD,MRCP [facility]) Gastroenterology/Hepatology Pager: [phone]

## 2021-04-02 ENCOUNTER — Ambulatory Visit: Payer: Self-pay | Admitting: Licensed Clinical Social Worker

## 2021-04-02 ENCOUNTER — Other Ambulatory Visit: Payer: Self-pay

## 2021-04-02 DIAGNOSIS — F411 Generalized anxiety disorder: Secondary | ICD-10-CM

## 2021-04-02 DIAGNOSIS — F321 Major depressive disorder, single episode, moderate: Secondary | ICD-10-CM

## 2021-04-02 NOTE — BH Specialist Note (Signed)
Integrated Behavioral Health Follow Up In-Person Visit  MRN: [id number] Name: Stacie Kennedy   Total time: 60 minutes  Types of Service: Telephone visit  Interpretor:Patient consents to telephone visit and 2 patient identifiers were used to identify patient  Interpretor Name and Language: N/A  Subjective: Stacie Kennedy is a 60 y.o. female accompanied by  herself Patient was referred by Carlyon Shadow, NP for mental health. Patient reports the following symptoms/concerns: The patient reports that she has been doing about the same since her last follow-up visit. She stated that she still takes Mirtazapine 7.5 MG at bedtime, but is only taking it approximately twice a week.The patient noted that she only takes it as a last resort when she can not fall asleep. She noted that she only has two left and requested a refill. She discussed financial stressors that are impacting her life. She stated that she does not have anyone that she can call when she is having a bad day. She stated that she use to enjoy fishing. However, she explained that she has not been fishing in years because she has no-one to go with. Ms. Burggraf endorsed feeling depressed and having difficulty falling asleep daily. She described feeling anxious and on edge, easily irritated and annoyed  half the days in the week and excessive uncontrollable worrying nearly everyday. The patient stated she did not have anything else she wanted to talk about today. The patient denied any suicidal or homicidal thoughts.   Severity of problem: moderate  Objective: Mood: Euthymic and Affect: Appropriate Risk of harm to self or others: No plan to harm self or others  Life Context: Family and Social: see above School/Work: see above Self-Care: see above Life Changes: see above  Patient and/or Family's Strengths/Protective Factors: Concrete supports in place (healthy food, safe environments, etc.)  Goals Addressed: Patient will:  Reduce symptoms  of: agitation, anxiety, depression, and insomnia   Increase knowledge and/or ability of: coping skills, healthy habits, self-management skills, and stress reduction   Demonstrate ability to: Increase healthy adjustment to current life circumstances  Progress towards Goals: Ongoing  Interventions: Interventions utilized:  CBT Cognitive Behavioral Therapy was utilized by the clinician during today's follow up session. The clinician processed with the patient how they have been doing since the last follow-up session. The clinician provided a space for the patient to ventilate their frustrations regarding their current life circumstances. Clinician measured the patient's anxiety and depression on a numerical scale. Clinician encouraged the patient to take their mediation at the same time everyday exactly as prescribed for it to reach it's full intended effect. Clinician offered to request a refill for the patient's prescribed Mirtazapine 7.5 MG from her provider. Clinician counseled patient on the benefits and potential risks including black box warnings. Guidance and supervision by physiatric consultant was provided to clinician to inform risks, benefits, and potential side effects of psychotropic medications. Patient was given the opportunity to ask questions and address concerns of psychotropic medications prescribed.  Clinician processed with the patient why it is important to practice daily self care and explored various areas and ways to incorporate it into her life. Clinician provided patient with her hours and contact information in addition to [facility] walk in and crisis line information. Clinician encouraged the patient to seek care at [facility], the nearest Emergency Department or dial 911 in the event she experiences a mental health crisis.    Standardized Assessments completed: GAD-7 and PHQ 9 PHQ-9    9 GAD-7  14  Assessment: Patient currently experiencing see above.   Patient may benefit from see  above.  Plan: Follow up with behavioral health clinician on : 04/16/21 at 4:00 PM Behavioral recommendations:  Referral(s): [facility] (In Clinic) "From scale of 1-10, how likely are you to follow plan?":   Lesli Albee, LCSWA

## 2021-04-09 ENCOUNTER — Encounter: Payer: Self-pay | Admitting: Licensed Clinical Social Worker

## 2021-04-09 ENCOUNTER — Inpatient Hospital Stay: Payer: MEDICAID | Attending: Internal Medicine | Admitting: Licensed Clinical Social Worker

## 2021-04-09 ENCOUNTER — Inpatient Hospital Stay: Payer: MEDICAID

## 2021-04-09 ENCOUNTER — Other Ambulatory Visit: Payer: Self-pay

## 2021-04-09 DIAGNOSIS — Z860100 Personal history of colon polyps, unspecified: Secondary | ICD-10-CM | POA: Insufficient documentation

## 2021-04-09 DIAGNOSIS — Z809 Family history of malignant neoplasm, unspecified: Secondary | ICD-10-CM

## 2021-04-09 NOTE — Progress Notes (Signed)
REFERRING PROVIDER: Jonathon Bellows, MD [place],  [place] [address]  PRIMARY PROVIDER:  Pcp, No  PRIMARY REASON FOR VISIT:  1. Personal history of colonic polyps   2. Family history of cancer      HISTORY OF PRESENT ILLNESS:   Stacie Kennedy, a 60 y.o. female, was seen for a [place] cancer genetics consultation at the request of Dr. Vicente Males due to a personal history of colon polyps.  Stacie Kennedy presents to clinic today to discuss the possibility of a hereditary predisposition to cancer, genetic testing, and to further clarify her future cancer risks, as well as potential cancer risks for family members.   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  CANCER HISTORY:  Oncology History Overview Note  # NOV -[id number] [facility] CANCER SCREENING PROGRAM]-18 mm right upper lobe lung nodule; DEC 2021- s/p right upper lobectomy [Dr. Roxan Hockey; [place]]; STAGE: I [pT-18 mm; LN-12=0]; predominant large cell neuroendocrine; minor adenocarcinoma.  Declines adjuvant chemotherapy.  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  Visceral Pleura Invasion: Not identified  Direct Invasion of Adjacent Structures: No adjacent structures present  Lymphovascular Invasion: Not identified  Margins: All margins negative for invasive carcinoma       Closest Margin(s) to Invasive Carcinoma: Bronchovascular margin  Treatment Effect: No known presurgical therapy  Regional Lymph Nodes:       Number of Lymph Nodes Involved: 0                             Nodal Sites with Tumor: Not applicable       Number of Lymph Nodes Examined: 12    Primary cancer of right upper lobe of lung ([place])  11/03/2020 Initial Diagnosis   Primary cancer of right upper lobe of lung ([place])   11/03/2020 Cancer Staging   Staging form: Lung, AJCC 8th Edition - Pathologic: Stage IA3 (pT1c, pN0, cM0) - Signed by Cammie Sickle, MD on 11/04/2020       RISK FACTORS:  Menarche was at age 13.  First live birth at age 59.  Ovaries intact: yes.  Hysterectomy: no.  Menopausal status: postmenopausal.  HRT use: 0 years. Colonoscopy: yes;  11 tubular adenomas on 2022 colonoscopy, this was reportedly her first colonoscopy .  Past Medical History:  Diagnosis Date   Cancer [facility])    lung cancer   Depression    Duodenitis    Family history of cancer    High cholesterol    Personal history of colonic polyps    Pre-diabetes    Umbilical hernia    UTI (urinary tract infection)     Past Surgical History:  Procedure Laterality Date   COLONOSCOPY WITH PROPOFOL N/A 03/24/2021   Procedure: COLONOSCOPY WITH PROPOFOL;  Surgeon: Jonathon Bellows, MD;  Location: [facility] ENDOSCOPY;  Service: Gastroenterology;  Laterality: N/A;   INTERCOSTAL NERVE BLOCK  09/19/2020   Procedure: INTERCOSTAL NERVE BLOCK;  Surgeon: Modesto Charon  C, MD;  Location: [place];  Service: Thoracic;;   LUNG LOBECTOMY Right    LUNG REMOVAL, PARTIAL Right 09/19/2020   NODE DISSECTION Right 09/19/2020   Procedure: NODE DISSECTION;  Surgeon: Melrose Nakayama, MD;  Location: [facility] OR;  Service: Thoracic;  Laterality: Right;    Social History   Socioeconomic History   Marital status: Single    Spouse name: Not on file   Number of children: Not on file   Years of education: Not on file   Highest education level: Not on file  Occupational History   Not on file  Tobacco Use   Smoking status: Former    Packs/day: 0.25    Years: 43.00    Pack years: 10.75    Types: Cigarettes   Smokeless  tobacco: Never   Tobacco comments:    states she is slowing, trying to quit  Vaping Use   Vaping Use: Never used  Substance and Sexual Activity   Alcohol use: Yes    Comment: 1 quart of beer weekly   Drug use: No   Sexual activity: Not on file  Other Topics Concern   Not on file  Social History Narrative   Lives in [place]; smokes; now and then beer; with boy friend. 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    Social Determinants of Health   Financial Resource Strain: High Risk   Difficulty of Paying Living Expenses: Hard  Food Insecurity: Food Insecurity Present   Worried About Running Out of Food in the Last Year: Sometimes true   Ran Out of Food in the Last Year: Sometimes true  Transportation Needs: No Transportation Needs   Lack of Transportation (Medical): No   Lack of Transportation (Non-Medical): No  Physical Activity: Sufficiently Active   Days of Exercise per Week: 5 days   Minutes of Exercise per Session: 60 min  Stress: Stress Concern Present   Feeling of Stress : Very much  Social Connections: Socially Isolated   Frequency of Communication with Friends and Family: Never   Frequency of Social Gatherings with Friends and Family: Never   Attends Religious Services: Never   Marine scientist or Organizations: No   Attends Music therapist: Never   Marital Status: Never married     FAMILY HISTORY:  We obtained a detailed, 4-generation family history.  Significant diagnoses are listed below: Family History  Problem Relation Age of Onset   Diabetes Mother    Cancer Mother    Diabetes Father    Stroke Father    Heart attack Father        37s   Healthy Sister    Cancer Brother    Hepatitis Brother    Diabetes Brother    Hypertension Brother    Heart disease Brother    Stacie Kennedy has 1 son, 4. She had 2 brothers and 2 sisters. One brother passed of cancer, possibly colon, at 61.   Stacie Kennedy mother had cancer and died in her  8s, unknown type. Patient had 2 maternal uncles and 4 maternal aunts. One uncle had cancer, unknown type. Limited information about this side of the family, no other known cancers/individuals with polyps.  Stacie Kennedy father died at 62 of a heart attack. Patient has no info about this side of the family. No known cancers/individuals with polyps.  Ms. Uriegas is unaware of previous family history of genetic testing for hereditary cancer risks. Patient's maternal ancestors are of unknown descent, and paternal  ancestors are of unknown descent. There is no reported Ashkenazi Jewish ancestry. There is no known consanguinity.    GENETIC COUNSELING ASSESSMENT: Stacie Kennedy is a 60 y.o. female with a personal history of 11 colon polyps which is somewhat suggestive of a hereditary polyposis syndrome and predisposition to cancer. We, therefore, discussed and recommended the following at today's visit.   DISCUSSION: We discussed that polyps in general are common, however, most people have fewer than 5 lifetime polyps.  When an individual has 10 or more polyps we become concerned about an underlying polyposis syndrome.  The most common hereditary polyposis syndromes are caused by problems in the APC and MUTYH genes, however, more recently, mutations in the [place] and MSH3 genes have been identified in some polyposis families. We discussed that testing is beneficial for several reasons including knowing how to follow individuals for cancer screenings, and understand if other family members could be at risk for cancer and allow them to undergo genetic testing.   We reviewed the characteristics, features and inheritance patterns of hereditary cancer syndromes. We also discussed genetic testing, including the appropriate family members to test, the process of testing, insurance coverage and turn-around-time for results. We discussed the implications of a negative, positive and/or variant of uncertain significant result. We  recommended Stacie Kennedy pursue genetic testing for the [facility] CustomNext+RNA gene panel.   Based on Stacie Kennedy's personal history of polyps she meets medical criteria for genetic testing. Despite that she meets criteria, she may still have an out of pocket cost. We discussed that if her out of pocket cost for testing is over $100, the laboratory will call and confirm whether she wants to proceed with testing.  If the out of pocket cost of testing is less than $100 she will be billed by the genetic testing laboratory.   PLAN: After considering the risks, benefits, and limitations, Stacie Kennedy provided informed consent to pursue genetic testing and the blood sample was sent to [facility] for analysis of the CustomNext+RNA panel. Results should be available within approximately 2-3 weeks' time, at which point they will be disclosed by telephone to Stacie Kennedy, as will any additional recommendations warranted by these results. Stacie Kennedy will receive a summary of her genetic counseling visit and a copy of her results once available. This information will also be available in Epic.   Stacie Kennedy questions were answered to her satisfaction today. Our contact information was provided should additional questions or concerns arise. Thank you for the referral and allowing [place] to share in the care of your patient.   Faith Rogue, MS, [facility] Genetic Counselor [email or web address] Phone: [phone]  The patient was seen for a total of 20 minutes in face-to-face genetic counseling.  Patient was seen alone. Dr. Grayland Ormond was available for discussion regarding this case.   _______________________________________________________________________ For Office Staff:  Number of people involved in session: 1 Was an Intern/ student involved with case: no

## 2021-04-16 ENCOUNTER — Ambulatory Visit: Payer: Self-pay | Admitting: Licensed Clinical Social Worker

## 2021-04-16 ENCOUNTER — Other Ambulatory Visit: Payer: Self-pay

## 2021-04-16 DIAGNOSIS — F411 Generalized anxiety disorder: Secondary | ICD-10-CM

## 2021-04-16 DIAGNOSIS — F321 Major depressive disorder, single episode, moderate: Secondary | ICD-10-CM

## 2021-04-16 NOTE — BH Specialist Note (Signed)
Integrated Behavioral Health Follow Up In-Person Visit  MRN: [id number] Name: Stacie Kennedy   Total time: 30 minutes  Types of Service: Telephone visit Patient consents to telephone visit and 2 patient identifiers were used to identify patient   Interpretor:No. Interpretor Name and Language: N/A  Subjective: Stacie Kennedy is a 60 y.o. female accompanied by  herself Patient was referred by Carlyon Shadow, NP for mental health. Patient reports the following symptoms/concerns: The patient reports that she is doing okay. The patient noted that she is doing okay but is having a hard time getting her medication refills. She discussed several financial stressors impacting her life. She noted that she does not practice self care, but use to enjoy fishing. She noted that she does not have anyone to go fishing with anymore and does not have anyone to call and talk to other than her boyfriend.The patient stated that she did not want to talk anymore today.  Duration of problem: years; Severity of problem: moderate  Objective: Mood: Euthymic and Affect: Appropriate Risk of harm to self or others: No plan to harm self or others  Life Context: Family and Social: see above School/Work: see above Self-Care: see above Life Changes: see above  Patient and/or Family's Strengths/Protective Factors: Concrete supports in place (healthy food, safe environments, etc.)  Goals Addressed: Patient will:  Reduce symptoms of: agitation, anxiety, depression, and insomnia   Increase knowledge and/or ability of: coping skills, self-management skills, and stress reduction   Demonstrate ability to: Increase healthy adjustment to current life circumstances and Increase adequate support systems for patient/family  Progress towards Goals: Ongoing  Interventions: Interventions utilized:  CBT Cognitive Behavioral Therapy was utilized by the clinician during today's follow up session. The clinician processed with the  patient how they have been doing since the last follow-up session. The clinician provided a space for the patient to ventilate their frustrations regarding their current life circumstances. Clinician measured the patient's anxiety and depression on a numerical scale.  Clinician offered to send a message to the patient's primary provider regarding her medication refill concerns. Clinician encouraged the patient to continue to incorporate self care into her daily routine and to consider ways such as community activities or support groups to expand her social opportunities and support systems. Clinician told the patient that while things can be hard to talk about it can be helpful to push through and encouraged the patient to incorporate self care into her daily routine.  Standardized Assessments completed: GAD-7 and PHQ 9 GAD-7   14 PHQ-9     9   Assessment: Patient currently experiencing see above.   Patient may benefit from see above.  Plan: Follow up with behavioral health clinician on : 04/28/2021 at 4:00 PM  Behavioral recommendations:  Referral(s): [place] (In Clinic) "From scale of 1-10, how likely are you to follow plan?":   Lesli Albee, LCSWA

## 2021-04-21 ENCOUNTER — Other Ambulatory Visit: Payer: Self-pay

## 2021-04-21 ENCOUNTER — Other Ambulatory Visit: Payer: Self-pay | Admitting: Gerontology

## 2021-04-21 DIAGNOSIS — F331 Major depressive disorder, recurrent, moderate: Secondary | ICD-10-CM

## 2021-04-21 MED ORDER — MIRTAZAPINE 15 MG PO TABS
7.5000 mg | ORAL_TABLET | Freq: Every day | ORAL | 0 refills | Status: DC
Start: 2021-04-21 — End: 2021-06-16
  Filled 2021-04-21: qty 15, 30d supply, fill #0

## 2021-04-22 ENCOUNTER — Other Ambulatory Visit: Payer: Self-pay

## 2021-04-22 ENCOUNTER — Ambulatory Visit: Admission: RE | Admit: 2021-04-22 | Payer: Self-pay | Source: Ambulatory Visit

## 2021-04-24 ENCOUNTER — Other Ambulatory Visit: Payer: Self-pay | Admitting: *Deleted

## 2021-04-24 DIAGNOSIS — C3490 Malignant neoplasm of unspecified part of unspecified bronchus or lung: Secondary | ICD-10-CM

## 2021-04-24 DIAGNOSIS — Z86010 Personal history of colonic polyps: Secondary | ICD-10-CM

## 2021-04-27 ENCOUNTER — Telehealth: Payer: Self-pay | Admitting: Internal Medicine

## 2021-04-27 ENCOUNTER — Inpatient Hospital Stay: Payer: MEDICAID

## 2021-04-27 ENCOUNTER — Inpatient Hospital Stay: Payer: MEDICAID | Admitting: Internal Medicine

## 2021-04-27 ENCOUNTER — Other Ambulatory Visit: Payer: Self-pay

## 2021-04-27 NOTE — Telephone Encounter (Signed)
Message left with patient to call back to further discuss need for upcoming appts.

## 2021-04-27 NOTE — Telephone Encounter (Signed)
Stacie Kennedy - If able, would you reach out to the patient to discuss the reason for the ct scan. Thanks.

## 2021-04-27 NOTE — Telephone Encounter (Signed)
Patient is requesting a call back from clinical team to discuss the rescheduled CT scan.  Originally scheduled on 7/13 however patient was a No Show as she said she was unaware of the appointment.  Routing to clinical team for follow up.

## 2021-04-28 ENCOUNTER — Ambulatory Visit: Payer: Self-pay | Admitting: Licensed Clinical Social Worker

## 2021-04-28 ENCOUNTER — Telehealth: Payer: Self-pay | Admitting: Licensed Clinical Social Worker

## 2021-04-28 DIAGNOSIS — F331 Major depressive disorder, recurrent, moderate: Secondary | ICD-10-CM

## 2021-04-28 DIAGNOSIS — F411 Generalized anxiety disorder: Secondary | ICD-10-CM

## 2021-04-28 NOTE — BH Specialist Note (Signed)
Integrated Behavioral Health Follow Up In-Person Visit  MRN: [id number] Name: Stacie Kennedy   Total time: 30 minutes  Types of Service: Telephone visit Patient consents to telephone visit and 2 patient identifiers were used to identify patient .    Interpretor:No. Interpretor Name and Language: N/A  Subjective: Stacie Kennedy is a 60 y.o. female accompanied by  herself Patient was referred by Carlyon Shadow, NP  for mental health. Patient reports the following symptoms/concerns: The patient reported that she is about the same since her last follow-up session. She stated that she is unable to get her prescriptions from medication management because of paperwork. She stated that she sent them her W-2's but they keep telling her to resend her tax papers and she is unsure what they mean. She explained that she is very frustrated that they won't help her or tell her exactly what she needs to do to fix the problem. The patient discussed several financial and relationship stressors impacting her life. She stated that she feels safe in her current home. The patient denied any suicidal or homicidal thoughts.  Duration of problem: years; Severity of problem: moderate  Objective: Mood: Euthymic and Affect: Appropriate Risk of harm to self or others: No plan to harm self or others  Life Context: Family and Social: see above School/Work: see above Self-Care: see above Life Changes: see above  Patient and/or Family's Strengths/Protective Factors: Concrete supports in place (healthy food, safe environments, etc.)  Goals Addressed: Patient will:  Reduce symptoms of: agitation, anxiety, depression, and insomnia   Increase knowledge and/or ability of: coping skills, healthy habits, self-management skills, and stress reduction   Demonstrate ability to: Increase healthy adjustment to current life circumstances  Progress towards Goals: Ongoing  Interventions: Interventions utilized:  Supportive  Counselingwas utilized by the clinician during today's follow up session. The clinician processed with the patient how they have been doing since the last follow-up session. The clinician provided a space for the patient to ventilate their frustrations regarding their current life circumstances. Clinician measured the patient's anxiety and depression on a numerical scale. Clinician provided caring, empathy and warmth in a nonjudgmental way to offer support to patient and establish a stronger rapport. Clinician explored with patient areas that she could incorporate self care into her everyday routine. Clinician explained the benefits of self care on the body and specifically mental health. Clinician offered to have the social worker at [facility] call the patient to see if she may be able to assist with the difficulties she was having obtaining her prescription medications.   Standardized Assessments completed: GAD-7 and PHQ 9 Gad-7    13 PHQ-9     6   Assessment: Patient currently experiencing see above.   Patient may benefit from see above.  Plan: Follow up with behavioral health clinician on : 05/13/2021 at 3:00 PM  Behavioral recommendations:  Referral(s): [place] (In Clinic) "From scale of 1-10, how likely are you to follow plan?":   Lesli Albee, LCSWA

## 2021-04-28 NOTE — Telephone Encounter (Signed)
04/28/2021 Pt called and left VM stating that she was returning call from [employer]. Attempted to call pt back but was unable to reach her, left a VM  SRW

## 2021-04-29 ENCOUNTER — Telehealth: Payer: Self-pay

## 2021-04-29 NOTE — Telephone Encounter (Signed)
Social worker called patient to check on her and to see if she was still having difficulty with getting her medication from [facility].  Patient informed social worker that she had ordered the tax transcript that [facility] needed in order to dispense her prescriptions.

## 2021-04-30 ENCOUNTER — Other Ambulatory Visit: Payer: Self-pay

## 2021-04-30 NOTE — Telephone Encounter (Signed)
Social worker called patient to schedule an appointment with her for next week to address the need for necessary resources.

## 2021-05-05 ENCOUNTER — Telehealth: Payer: Self-pay

## 2021-05-05 ENCOUNTER — Ambulatory Visit: Payer: Self-pay

## 2021-05-05 NOTE — Telephone Encounter (Signed)
Social worker called patient twice for 3 pm appointment but there was no answer and voicemail was full.  Social worker later called patient to reschedule her appointment but voicemail was full.

## 2021-05-07 ENCOUNTER — Ambulatory Visit: Admission: RE | Admit: 2021-05-07 | Payer: Self-pay | Source: Ambulatory Visit

## 2021-05-08 ENCOUNTER — Other Ambulatory Visit: Payer: Self-pay

## 2021-05-08 DIAGNOSIS — C3490 Malignant neoplasm of unspecified part of unspecified bronchus or lung: Secondary | ICD-10-CM

## 2021-05-11 ENCOUNTER — Inpatient Hospital Stay: Payer: MEDICAID

## 2021-05-11 ENCOUNTER — Inpatient Hospital Stay: Payer: MEDICAID | Admitting: Internal Medicine

## 2021-05-12 ENCOUNTER — Other Ambulatory Visit: Payer: Self-pay

## 2021-05-12 ENCOUNTER — Ambulatory Visit: Payer: Self-pay | Admitting: Thoracic Surgery (Cardiothoracic Vascular Surgery)

## 2021-05-12 NOTE — Telephone Encounter (Signed)
Social worker called patient and left a message for patient to call clinic to reschedule her appointments with [place] and Education officer, museum and provided clinic phone number.

## 2021-05-13 ENCOUNTER — Ambulatory Visit: Payer: Self-pay | Admitting: Licensed Clinical Social Worker

## 2021-05-14 ENCOUNTER — Ambulatory Visit: Payer: Self-pay

## 2021-05-14 ENCOUNTER — Telehealth: Payer: Self-pay | Admitting: Pharmacist

## 2021-05-14 NOTE — Telephone Encounter (Signed)
Social worker called patient 3 times for 4 pm appointment but patient's voicemail was full.  Social worker called patient again 2 times for 4 pm appointment and patient's voicemail was, as before, full.

## 2021-05-14 NOTE — Telephone Encounter (Signed)
Patient approved for medication assistance at [facility] until 01/09/22, as long as eligibility criteria continues to be met.   Stacie Kennedy [facility] Administrative Assistant 

## 2021-05-19 ENCOUNTER — Encounter: Payer: Self-pay | Admitting: Licensed Clinical Social Worker

## 2021-05-19 NOTE — Telephone Encounter (Signed)
Attempted to reach patient x3 with genetic test results. Will send patient a letter letting her know results are ready and to call back when she gets a chance.

## 2021-05-20 ENCOUNTER — Other Ambulatory Visit: Payer: Self-pay

## 2021-05-20 ENCOUNTER — Ambulatory Visit
Admission: RE | Admit: 2021-05-20 | Discharge: 2021-05-20 | Disposition: A | Discharge status: Home / Self Care | Payer: Medicaid Other | Source: Ambulatory Visit | Attending: Internal Medicine | Admitting: Internal Medicine

## 2021-05-20 DIAGNOSIS — C3490 Malignant neoplasm of unspecified part of unspecified bronchus or lung: Secondary | ICD-10-CM | POA: Diagnosis not present

## 2021-05-20 LAB — POCT I-STAT CREATININE: Creatinine, Ser: 0.5 mg/dL (ref 0.44–1.00)

## 2021-05-20 MED ORDER — IOHEXOL 350 MG/ML SOLN
60.0000 mL | Freq: Once | INTRAVENOUS | Status: AC | PRN
  Administered 2021-05-20: 60 mL via INTRAVENOUS

## 2021-05-21 ENCOUNTER — Ambulatory Visit: Payer: Self-pay | Admitting: Licensed Clinical Social Worker

## 2021-05-21 DIAGNOSIS — F331 Major depressive disorder, recurrent, moderate: Secondary | ICD-10-CM

## 2021-05-21 DIAGNOSIS — F411 Generalized anxiety disorder: Secondary | ICD-10-CM

## 2021-05-21 NOTE — BH Specialist Note (Signed)
Integrated Behavioral Health Follow Up Telephone Visit  MRN: [id number] Name: Stacie Kennedy  Total time: 60 minutes  Types of Service: Telephone visit Patient consents to telephone visit and 2 patient identifiers were used to identify patient   Interpretor:No. Interpretor Name and Language: N/A  Subjective: Stacie Kennedy is a 60 y.o. female accompanied by  herself Patient was referred by Carlyon Shadow, NP for Mental Health. Patient reports the following symptoms/concerns: The patient reports that she has been doing the same since her last follow-up appointment. She shared that she feels lonely often and is not able to get out and do things she enjoys. She shared that she has been working with the Education officer, museum at [employer] to find resources to help her with financial stressors in her life. She shared that she spends a lot of time worrying and that she crys often for no reason throughout her day and is irritable and easily annoyed.Ms. Savino noted that she takes Mirtazapine 15 MG at the same time everyday. She stated that she has no other complaints and is managing well overall. The patient denied any suicidal or homicidal thoughts.    Duration of problem: Years; Severity of problem: moderate  Objective: Mood: Depressed and Affect: Appropriate Risk of harm to self or others: No plan to harm self or others  Life Context: Family and Social: see above School/Work: see above Self-Care: see above Life Changes: see above  Patient and/or Family's Strengths/Protective Factors: Concrete supports in place (healthy food, safe environments, etc.)  Goals Addressed: Patient will:  Reduce symptoms of: agitation, anxiety, depression, insomnia, and stress   Increase knowledge and/or ability of: coping skills, healthy habits, self-management skills, and stress reduction   Demonstrate ability to: Increase healthy adjustment to current life circumstances  Progress towards  Goals: Ongoing  Interventions: Interventions utilized:  Supportive Counselingwas utilized by the clinician during today's follow up session. The clinician processed with the patient how they have been doing since the last follow-up session. The clinician provided a space for the patient to ventilate their frustrations regarding their current life circumstances. Clinician measured the patient's anxiety and depression on a numerical scale.  Clinician encouraged the patient to continue to take their mediation at the same time everyday exactly as prescribed for it to reach it's full intended effect. Clinician explained that the patient would benefit from increasing her use of self care to recharge and better be able to cope with the stressors in her life.    Standardized Assessments completed: GAD-7 and PHQ 9 PHQ-9     13 GAD-7     13  Assessment: Patient currently experiencing see above.   Patient may benefit from see above.  Plan: Follow up with behavioral health clinician on : 06/04/2021 at 5:00 PM  Behavioral recommendations:  Referral(s): [place] (In Clinic) "From scale of 1-10, how likely are you to follow plan?":   Lesli Albee, LCSWA

## 2021-05-22 ENCOUNTER — Other Ambulatory Visit: Payer: Self-pay

## 2021-05-22 ENCOUNTER — Inpatient Hospital Stay: Payer: Medicaid Other | Attending: Internal Medicine

## 2021-05-22 ENCOUNTER — Inpatient Hospital Stay (HOSPITAL_BASED_OUTPATIENT_CLINIC_OR_DEPARTMENT_OTHER): Payer: Medicaid Other | Admitting: Internal Medicine

## 2021-05-22 VITALS — BP 123/82 | HR 73 | Temp 97.2°F | Resp 20 | Ht 67.0 in | Wt 149.2 lb

## 2021-05-22 DIAGNOSIS — Z794 Long term (current) use of insulin: Secondary | ICD-10-CM | POA: Insufficient documentation

## 2021-05-22 DIAGNOSIS — Z87440 Personal history of urinary (tract) infections: Secondary | ICD-10-CM | POA: Insufficient documentation

## 2021-05-22 DIAGNOSIS — J449 Chronic obstructive pulmonary disease, unspecified: Secondary | ICD-10-CM

## 2021-05-22 DIAGNOSIS — R918 Other nonspecific abnormal finding of lung field: Secondary | ICD-10-CM

## 2021-05-22 DIAGNOSIS — Z79899 Other long term (current) drug therapy: Secondary | ICD-10-CM | POA: Diagnosis not present

## 2021-05-22 DIAGNOSIS — G8929 Other chronic pain: Secondary | ICD-10-CM | POA: Diagnosis not present

## 2021-05-22 DIAGNOSIS — C3490 Malignant neoplasm of unspecified part of unspecified bronchus or lung: Secondary | ICD-10-CM

## 2021-05-22 DIAGNOSIS — F17210 Nicotine dependence, cigarettes, uncomplicated: Secondary | ICD-10-CM | POA: Insufficient documentation

## 2021-05-22 DIAGNOSIS — Z87891 Personal history of nicotine dependence: Secondary | ICD-10-CM | POA: Diagnosis not present

## 2021-05-22 DIAGNOSIS — R0789 Other chest pain: Secondary | ICD-10-CM | POA: Diagnosis not present

## 2021-05-22 DIAGNOSIS — C3411 Malignant neoplasm of upper lobe, right bronchus or lung: Secondary | ICD-10-CM | POA: Diagnosis not present

## 2021-05-22 DIAGNOSIS — Z86010 Personal history of colonic polyps: Secondary | ICD-10-CM | POA: Diagnosis not present

## 2021-05-22 DIAGNOSIS — K439 Ventral hernia without obstruction or gangrene: Secondary | ICD-10-CM | POA: Insufficient documentation

## 2021-05-22 DIAGNOSIS — E7800 Pure hypercholesterolemia, unspecified: Secondary | ICD-10-CM | POA: Diagnosis not present

## 2021-05-22 DIAGNOSIS — J439 Emphysema, unspecified: Secondary | ICD-10-CM | POA: Diagnosis not present

## 2021-05-22 DIAGNOSIS — Z809 Family history of malignant neoplasm, unspecified: Secondary | ICD-10-CM | POA: Diagnosis not present

## 2021-05-22 DIAGNOSIS — I700 Atherosclerosis of aorta: Secondary | ICD-10-CM | POA: Insufficient documentation

## 2021-05-22 LAB — CBC WITH DIFFERENTIAL/PLATELET
Abs Immature Granulocytes: 0.01 10*3/uL (ref 0.00–0.07)
Basophils Absolute: 0 10*3/uL (ref 0.0–0.1)
Basophils Relative: 1 %
Eosinophils Absolute: 0.1 10*3/uL (ref 0.0–0.5)
Eosinophils Relative: 1 %
HCT: 46.9 % — ABNORMAL HIGH (ref 36.0–46.0)
Hemoglobin: 15.7 g/dL — ABNORMAL HIGH (ref 12.0–15.0)
Immature Granulocytes: 0 %
Lymphocytes Relative: 53 %
Lymphs Abs: 3.4 10*3/uL (ref 0.7–4.0)
MCH: 29.1 pg (ref 26.0–34.0)
MCHC: 33.5 g/dL (ref 30.0–36.0)
MCV: 86.9 fL (ref 80.0–100.0)
Monocytes Absolute: 0.6 10*3/uL (ref 0.1–1.0)
Monocytes Relative: 9 %
Neutro Abs: 2.3 10*3/uL (ref 1.7–7.7)
Neutrophils Relative %: 36 %
Platelets: 409 10*3/uL — ABNORMAL HIGH (ref 150–400)
RBC: 5.4 MIL/uL — ABNORMAL HIGH (ref 3.87–5.11)
RDW: 14.5 % (ref 11.5–15.5)
WBC: 6.4 10*3/uL (ref 4.0–10.5)
nRBC: 0 % (ref 0.0–0.2)

## 2021-05-22 LAB — COMPREHENSIVE METABOLIC PANEL
ALT: 9 U/L (ref 0–44)
AST: 13 U/L — ABNORMAL LOW (ref 15–41)
Albumin: 3.7 g/dL (ref 3.5–5.0)
Alkaline Phosphatase: 91 U/L (ref 38–126)
Anion gap: 8 (ref 5–15)
BUN: 14 mg/dL (ref 6–20)
CO2: 26 mmol/L (ref 22–32)
Calcium: 9.8 mg/dL (ref 8.9–10.3)
Chloride: 105 mmol/L (ref 98–111)
Creatinine, Ser: 0.72 mg/dL (ref 0.44–1.00)
GFR, Estimated: >60 mL/min (ref >60)
Glucose, Bld: 100 mg/dL — ABNORMAL HIGH (ref 70–99)
Potassium: 4.1 mmol/L (ref 3.5–5.1)
Sodium: 139 mmol/L (ref 135–145)
Total Bilirubin: 0.4 mg/dL (ref 0.3–1.2)
Total Protein: 8 g/dL (ref 6.5–8.1)

## 2021-05-22 NOTE — Progress Notes (Signed)
[place] CONSULT NOTE  Patient Care Team: Pcp, No as PCP - General [place], Angie Fava, RN as Oncology Nurse Navigator  CHIEF COMPLAINTS/PURPOSE OF CONSULTATION: lung cancer  #  Oncology History Overview Note  # NOV -[id number] [facility] CANCER SCREENING PROGRAM]-18 mm right upper lobe lung nodule; DEC 2021- s/p right upper lobectomy [Dr. Roxan Hockey; [place]]; STAGE: I [pT-18 mm; LN-12=0]; predominant large cell neuroendocrine; minor adenocarcinoma.  Declines adjuvant chemotherapy.  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  Visceral Pleura Invasion: Not identified  Direct Invasion of Adjacent Structures: No adjacent structures present  Lymphovascular Invasion: Not identified  Margins: All margins negative for invasive carcinoma       Closest Margin(s) to Invasive Carcinoma: Bronchovascular margin  Treatment Effect: No known presurgical therapy  Regional Lymph Nodes:       Number of Lymph Nodes Involved: 0                            Nodal Sites with Tumor: Not applicable       Number of Lymph Nodes Examined: 12    Primary cancer of right upper lobe of lung ([place])  11/03/2020 Initial Diagnosis   Primary cancer of right upper lobe of lung ([place])   11/03/2020 Cancer Staging   Staging form: Lung, AJCC 8th Edition - Pathologic: Stage IA3 (pT1c, pN0, cM0) - Signed by Cammie Sickle, MD on 11/04/2020      HISTORY OF PRESENTING ILLNESS:  Stacie Kennedy 60 y.o.  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  Denies any nausea or vomiting.  Patient continues to complain of chronic chest wall pain at the site of surgery.  Chronic shortness of breath/chronic mild cough no hemoptysis.  Unfortunately continues to smoke.  Review of Systems  Constitutional:  Negative for chills, diaphoresis, fever, malaise/fatigue and weight loss.  HENT:  Negative for nosebleeds and sore throat.   Eyes:  Negative for double vision.  Respiratory:  Negative for cough, hemoptysis, sputum production, shortness of breath and wheezing.   Cardiovascular:  Negative for chest pain, palpitations, orthopnea and leg swelling.  Gastrointestinal:  Negative for abdominal pain, blood in stool, constipation, diarrhea, heartburn, melena, nausea and vomiting.  Genitourinary:  Negative for dysuria, frequency and urgency.  Musculoskeletal:  Positive for back pain. Negative for joint pain.  Skin: Negative.  Negative for itching and rash.  Neurological:  Negative for dizziness, tingling, focal weakness, weakness and headaches.  Endo/Heme/Allergies:  Does not bruise/bleed easily.  Psychiatric/Behavioral:  Negative for depression. The patient is not nervous/anxious and does not have insomnia.     MEDICAL HISTORY:  Past Medical History:  Diagnosis Date   Cancer [facility])    lung cancer   Depression    Duodenitis    Family history of cancer    High cholesterol    Personal history of colonic polyps    Pre-diabetes    Umbilical hernia    UTI (urinary  tract infection)     SURGICAL HISTORY: Past Surgical History:  Procedure Laterality Date   COLONOSCOPY WITH PROPOFOL N/A 03/24/2021   Procedure: COLONOSCOPY WITH PROPOFOL;  Surgeon: Jonathon Bellows, MD;  Location: [facility] ENDOSCOPY;  Service: Gastroenterology;  Laterality: N/A;   INTERCOSTAL NERVE BLOCK  09/19/2020   Procedure: INTERCOSTAL NERVE BLOCK;  Surgeon: Melrose Nakayama, MD;  Location: [place];  Service: Thoracic;;   LUNG LOBECTOMY Right    LUNG REMOVAL, PARTIAL Right  09/19/2020   NODE DISSECTION Right 09/19/2020   Procedure: NODE DISSECTION;  Surgeon: Melrose Nakayama, MD;  Location: [facility] OR;  Service: Thoracic;  Laterality: Right;    SOCIAL HISTORY: Social History   Socioeconomic History   Marital status: Single    Spouse name: Not on file   Number of children: Not on file   Years of education: Not on file   Highest education level: Not on file  Occupational History   Not on file  Tobacco Use   Smoking status: Former    Packs/day: 0.25    Years: 43.00    Pack years: 10.75    Types: Cigarettes   Smokeless tobacco: Never   Tobacco comments:    states she is slowing, trying to quit  Vaping Use   Vaping Use: Never used  Substance and Sexual Activity   Alcohol use: Yes    Comment: 1 quart of beer weekly   Drug use: No   Sexual activity: Not on file  Other Topics Concern   Not on file  Social History Narrative   Lives in [place]; smokes; now and then beer; with boy friend. 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    Social Determinants of Health   Financial Resource Strain: High Risk   Difficulty of Paying Living Expenses: Hard  Food Insecurity: Food Insecurity Present   Worried About Charity fundraiser in the Last Year: Often true   Ran Out of Food in the Last Year: Sometimes true  Transportation Needs: No Transportation Needs   Lack of Transportation (Medical): No   Lack of Transportation (Non-Medical): No  Physical Activity: Insufficiently Active   Days of Exercise per Week: 7 days   Minutes of Exercise per Session: 20 min  Stress: Stress Concern Present   Feeling of Stress : To some extent  Social Connections: Socially Isolated   Frequency of Communication with Friends and Family: Once a week   Frequency of Social Gatherings with Friends and Family: Once a week   Attends Religious Services: Never   Marine scientist or Organizations: Yes   Attends Archivist Meetings: Never   Marital  Status: Never married  Human resources officer Violence: Not At Risk   Fear of Current or Ex-Partner: No   Emotionally Abused: No   Physically Abused: No   Sexually Abused: No    FAMILY HISTORY: Family History  Problem Relation Age of Onset   Diabetes Mother    Cancer Mother    Diabetes Father    Stroke Father    Heart attack Father        90s   Healthy Sister    Cancer Brother    Hepatitis Brother    Diabetes Brother    Hypertension Brother    Heart disease Brother     ALLERGIES:  has No Known Allergies.  MEDICATIONS:  Current Outpatient Medications  Medication Sig Dispense Refill   metFORMIN (GLUCOPHAGE) 500 MG tablet TAKE ONE TABLET BY MOUTH EVERY MORNING WITH BREAKFAST  30 tablet 3   mirtazapine (REMERON) 15 MG tablet Take (1/2) tablet (7.5 mg total) by mouth at bedtime. 30 tablet 0   rosuvastatin (CRESTOR) 10 MG tablet TAKE ONE TABLET BY MOUTH EVERY DAY 30 tablet 3   No current facility-administered medications for this visit.      Marland Kitchen  PHYSICAL EXAMINATION: ECOG PERFORMANCE STATUS: 0 - Asymptomatic  Vitals:   05/22/21 1517  BP: 123/82  Pulse: 73  Resp: 20  Temp: (!) 97.2 F (36.2 C)  SpO2: 95%    Filed Weights   05/22/21 1517  Weight: 149 lb 3.2 oz (67.7 kg)     Physical Exam Constitutional:      Comments: Alone; ambulating independently.   HENT:     Head: Normocephalic and atraumatic.     Mouth/Throat:     Pharynx: No oropharyngeal exudate.  Eyes:     Pupils: Pupils are equal, round, and reactive to light.  Cardiovascular:     Rate and Rhythm: Normal rate and regular rhythm.  Pulmonary:     Effort: Pulmonary effort is normal. No respiratory distress.     Breath sounds: Normal breath sounds. No wheezing.  Abdominal:     General: Bowel sounds are normal. There is no distension.     Palpations: Abdomen is soft. There is no mass.     Tenderness: no abdominal tenderness There is no guarding or rebound.  Musculoskeletal:        General: No  tenderness. Normal range of motion.     Cervical back: Normal range of motion and neck supple.  Skin:    General: Skin is warm.  Neurological:     Mental Status: She is alert and oriented to person, place, and time.  Psychiatric:        Mood and Affect: Affect normal.     LABORATORY DATA:  I have reviewed the data as listed Lab Results  Component Value Date   WBC 6.4 05/22/2021   HGB 15.7 (H) 05/22/2021   HCT 46.9 (H) 05/22/2021   MCV 86.9 05/22/2021   PLT 409 (H) 05/22/2021   Recent Labs    09/21/20 0113 11/26/20 1214 05/20/21 1343 05/22/21 1500  NA 137 139  --  139  K 4.2 4.1  --  4.1  CL 106 103  --  105  CO2 26 22  --  26  GLUCOSE 107* 99  --  100*  BUN <5* 8  --  14  CREATININE 0.57 0.69 0.50 0.72  CALCIUM 8.9 9.5  --  9.8  GFRNONAA >60 96  --  >60  GFRAA  --  110  --   --   PROT 5.8* 6.9  --  8.0  ALBUMIN 2.9* 4.0  --  3.7  AST 18 18  --  13*  ALT 14 9  --  9  ALKPHOS 49 113  --  91  BILITOT 0.7 0.3  --  0.4    RADIOGRAPHIC STUDIES: I have personally reviewed the radiological images as listed and agreed with the findings in the report. CT Chest W Contrast  Result Date: 05/21/2021 CLINICAL DATA:  Non-small cell lung cancer EXAM: CT CHEST WITH CONTRAST TECHNIQUE: Multidetector CT imaging of the chest was performed during intravenous contrast administration. CONTRAST:  73mL OMNIPAQUE IOHEXOL 350 MG/ML SOLN COMPARISON:  CT chest dated August 13, 2020 FINDINGS: Cardiovascular: Normal heart size. Trace pericardial fluid. Three-vessel coronary artery calcifications. Aortic valve calcifications. Severe atherosclerotic disease of the thoracic aorta with areas  of irregular soft plaque. Mediastinum/Nodes: No pathologically enlarged lymph nodes seen in the chest. Full-thickness and thyroid are unremarkable. Lungs/Pleura: Prior right upper lobectomy. Complete collapse of the right middle lobe. No obstructing hilar lesion is visualized. No new or enlarging pulmonary nodules.  Upper Abdomen: Unchanged left adrenal nodule measuring up to 2.7 cm. Partially visualized small fat containing ventral abdominal wall hernia. Musculoskeletal: No chest wall abnormality. No acute or significant osseous findings. IMPRESSION: Prior right upper lobectomy with no evidence of recurrence or metastatic disease. Complete collapse of the right middle lobe with no obstructing hilar lesion visualized. Recommend attention on follow-up. Aortic Atherosclerosis (ICD10-I70.0) and Emphysema (ICD10-J43.9). 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  Stable  #  CT scan July 2022-no clinical evidence of recurrence; however right middle lobe complete collapse noted without any obstructing lesion-question mucous plugging versus others.  We will review at tumor conference.  Also refer to pulmonary.  We will repeat short-term imaging based on recommendation from team conference.   #Active smoker-counseled the patient to quit smoking.  Patient is reluctant.   Open door # DISPOSITION: # referral to pulmonary- Dr.Aleskerov re: COPD # follow up in 6 months-MD: labs- cbc/cmp-Dr.B  # I reviewed the blood work- with the patient in detail; also reviewed the imaging independently [as summarized above]; and with the patient in detail.    All questions were answered. The patient knows to call the clinic with any problems, questions or concerns.    Cammie Sickle, MD 05/23/2021 9:15 AM

## 2021-05-22 NOTE — Assessment & Plan Note (Addendum)
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  Stable  #  CT scan July 2022-no clinical evidence of recurrence; however right middle lobe complete collapse noted without any obstructing lesion-question mucous plugging versus others.  We will review at tumor conference.  Also refer to pulmonary.  We will repeat short-term imaging based on recommendation from team conference.  #Active smoker-counseled the patient to quit smoking.  Patient is reluctant.   Open door # DISPOSITION: # referral to pulmonary- Dr.Aleskerov re: COPD # follow up in 6 months-MD: labs- cbc/cmp-Dr.B  # I reviewed the blood work- with the patient in detail; also reviewed the imaging independently [as summarized above]; and with the patient in detail.

## 2021-05-25 ENCOUNTER — Other Ambulatory Visit: Payer: Self-pay

## 2021-05-26 ENCOUNTER — Other Ambulatory Visit: Payer: Self-pay

## 2021-05-26 ENCOUNTER — Ambulatory Visit: Payer: Self-pay

## 2021-05-26 DIAGNOSIS — Z789 Other specified health status: Secondary | ICD-10-CM

## 2021-05-26 NOTE — Telephone Encounter (Signed)
Patient called social worker to reschedule her appointment and social worker scheduled and conducted a resource appointment with patient.

## 2021-05-27 ENCOUNTER — Telehealth: Payer: Self-pay | Admitting: Licensed Clinical Social Worker

## 2021-05-27 ENCOUNTER — Ambulatory Visit: Payer: Self-pay | Admitting: Licensed Clinical Social Worker

## 2021-05-27 ENCOUNTER — Encounter: Payer: Self-pay | Admitting: Licensed Clinical Social Worker

## 2021-05-27 DIAGNOSIS — Z809 Family history of malignant neoplasm, unspecified: Secondary | ICD-10-CM

## 2021-05-27 DIAGNOSIS — Z86010 Personal history of colonic polyps: Secondary | ICD-10-CM

## 2021-05-27 DIAGNOSIS — Z1379 Encounter for other screening for genetic and chromosomal anomalies: Secondary | ICD-10-CM | POA: Insufficient documentation

## 2021-05-27 NOTE — Telephone Encounter (Signed)
Revealed negative genetic testing. We discussed that we do not know why she has had colon polyps/cancer or why there is cancer in the family. It could be due to a different gene that we are not testing, or something our current technology cannot pick up.  It will be important for her to keep in contact with genetics to learn if additional testing may be needed in the future.

## 2021-05-27 NOTE — Progress Notes (Signed)
HPI:  Stacie Kennedy was previously seen in the [place] clinic due to a personal history of polyps, family history of cancer, and concerns regarding a hereditary predisposition to cancer. Please refer to our prior cancer genetics clinic note for more information regarding our discussion, assessment and recommendations, at the time. Stacie Kennedy recent genetic test results were disclosed to her, as were recommendations warranted by these results. These results and recommendations are discussed in more detail below.  CANCER HISTORY:  Oncology History Overview Note  # NOV -[id number] [facility] CANCER SCREENING PROGRAM]-18 mm right upper lobe lung nodule; DEC 2021- s/p right upper lobectomy [Dr. Roxan Hockey; [place]]; STAGE: I [pT-18 mm; LN-12=0]; predominant large cell neuroendocrine; minor adenocarcinoma.  Declines adjuvant chemotherapy.  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  Visceral Pleura Invasion: Not identified  Direct Invasion of Adjacent Structures: No adjacent structures present  Lymphovascular Invasion: Not identified  Margins: All margins negative for invasive carcinoma       Closest Margin(s) to Invasive Carcinoma: Bronchovascular margin  Treatment Effect: No known presurgical therapy  Regional Lymph Nodes:       Number of Lymph Nodes Involved: 0                            Nodal Sites with Tumor: Not applicable       Number of Lymph Nodes Examined: 12    Primary cancer of right upper lobe of lung ([place])  11/03/2020 Initial Diagnosis   Primary cancer of right upper lobe of lung ([place])   11/03/2020 Cancer  Staging   Staging form: Lung, AJCC 8th Edition - Pathologic: Stage IA3 (pT1c, pN0, cM0) - Signed by Cammie Sickle, MD on 11/04/2020     FAMILY HISTORY:  We obtained a detailed, 4-generation family history.  Significant diagnoses are listed below: Family History  Problem Relation Age of Onset   Diabetes Mother    Cancer Mother    Diabetes Father    Stroke Father    Heart attack Father        68s   Healthy Sister    Cancer Brother    Hepatitis Brother    Diabetes Brother    Hypertension Brother    Heart disease Brother     Stacie Kennedy has 1 son, 104. She had 2 brothers and 2 sisters. One brother passed of cancer, possibly colon, at 43.    Stacie Kennedy mother had cancer and died in her 72s, unknown type. Patient had 2 maternal uncles and 4 maternal aunts. One uncle had cancer, unknown type. Limited information about this side of the family, no other known cancers/individuals with polyps.   Stacie Kennedy father died at 27 of a heart attack. Patient has no info about this side of the family. No known cancers/individuals with polyps.   Stacie Kennedy is unaware of previous family history of genetic testing for hereditary cancer risks. Patient's maternal ancestors are of unknown descent, and paternal ancestors are of unknown descent. There is no reported Ashkenazi Jewish ancestry.  There is no known consanguinity.    GENETIC TEST RESULTS: Genetic testing reported out on 04/23/2021 through the [employer] CustomNext+RNA cancer panel found no pathogenic mutations.   The CancerNext-Expanded + RNAinsight gene panel offered by [employer] and includes sequencing and rearrangement analysis for the following 52 genes:  APC, ATM, AXIN2, BARD1, BLM, BMPR1A, BRCA1, BRCA2, BRIP1, CDH1, CDK4, CDKN2A, CHEK2, CTNNA1, DICER1, FLCN, GALNT12, MEN1, MLH1, MSH2, MSH3, MSH6, MUTYH, NBN, NF1, NTHL1, PALB2, PMS2, PTEN, RAD50, RAD51C, RAD51D, SDHA, SDHB, SDHC, SDHD, SMAD4, SMARCA4, STK11, TP53, TSC1, TSC2 and VHL  (sequencing and deletion/duplication); HOXB13, KIT, MLH3, PDGFRA, POLD1, POLE and RPS20 (sequencing only); EPCAM and GREM1 (deletion/duplication only). RNA data is routinely analyzed for use in variant interpretation for all genes.  The test report has been scanned into EPIC and is located under the Molecular Pathology section of the Results Review tab.  A portion of the result report is included below for reference.     We discussed that because current genetic testing is not perfect, it is possible there may be a gene mutation in one of these genes that current testing cannot detect, but that chance is small.  There could be another gene that has not yet been discovered, or that we have not yet tested, that is responsible for the cancer diagnoses in the family. It is also possible there is a hereditary cause for the cancer in the family that Stacie Kennedy did not inherit and therefore was not identified in her testing.  Therefore, it is important to remain in touch with cancer genetics in the future so that we can continue to offer Stacie Kennedy the most up to date genetic testing.   ADDITIONAL GENETIC TESTING: We discussed with Stacie Kennedy that her genetic testing was fairly extensive.  If there are genes identified to increase cancer risk that can be analyzed in the future, we would be happy to discuss and coordinate this testing at that time.    CANCER SCREENING RECOMMENDATIONS: Stacie Kennedy test result is considered negative (normal).  This means that we have not identified a hereditary cause for her  personal and family history of cancer at this time. Most cancers happen by chance and this negative test suggests that her cancer may fall into this category.    While reassuring, this does not definitively rule out a hereditary predisposition to cancer. It is still possible that there could be genetic mutations that are undetectable by current technology. There could be genetic mutations in genes that have not  been tested or identified to increase cancer risk.  Therefore, it is recommended she continue to follow the cancer management and screening guidelines provided by her oncology and primary healthcare provider.   An individual's cancer risk and medical management are not determined by genetic test results alone. Overall cancer risk assessment incorporates additional factors, including personal medical history, family history, and any available genetic information that may result in a personalized plan for cancer prevention and surveillance.  This negative genetic test simply tells [place] that we cannot yet define why Stacie Kennedy has had  an increased number of colorectal polyps.  Stacie Kennedy medical management and screening should be based on the prospect that she  will likely form more colon polyps  and should, therefore, undergo more frequent colonoscopy screening at intervals determined by her GI providers.  We also recommended that Stacie Kennedy have an upper endoscopy periodically.  RECOMMENDATIONS FOR FAMILY MEMBERS:  Relatives in this family might be at some  increased risk of developing cancer, over the general population risk, simply due to the family history of cancer.  We recommended female relatives in this family have a yearly mammogram beginning at age 48, or 86 years younger than the earliest onset of cancer, an annual clinical breast exam, and perform monthly breast self-exams. Female relatives in this family should also have a gynecological exam as recommended by their primary provider.  All family members should be referred for colonoscopy starting at age 46.   FOLLOW-UP: Lastly, we discussed with Stacie Kennedy that cancer genetics is a rapidly advancing field and it is possible that new genetic tests will be appropriate for her and/or her family members in the future. We encouraged her to remain in contact with cancer genetics on an annual basis so we can update her personal and family histories and let her  know of advances in cancer genetics that may benefit this family.   Our contact number was provided. Stacie Kennedy questions were answered to her satisfaction, and she knows she is welcome to call us at anytime with additional questions or concerns.   Faith Rogue, MS, [facility] Genetic Counselor [email or web address] Phone: [phone]

## 2021-05-29 NOTE — Progress Notes (Signed)
Social worker asked patient if she was interested in applying for food stamps and Medicaid and gave her Charlene Brooke (public benefits navigator at the [employer]) phone number [phone], to assist her with this. Patient shared that she is not computer literate and has not checked on her disability status online due to this. Social worker provided [employer] phone number and let patient know that they may be able to assist her with this. Also provided  the [facility]'s phone number, hours of operation and information about how to obtain an emergency food box. Informed patient that social worker will send her an updated sheet of the various food resources in [facility]

## 2021-06-01 ENCOUNTER — Other Ambulatory Visit: Payer: Self-pay

## 2021-06-01 ENCOUNTER — Encounter: Payer: Self-pay | Admitting: Thoracic Surgery (Cardiothoracic Vascular Surgery)

## 2021-06-01 ENCOUNTER — Other Ambulatory Visit: Payer: Self-pay | Admitting: *Deleted

## 2021-06-01 ENCOUNTER — Ambulatory Visit (INDEPENDENT_AMBULATORY_CARE_PROVIDER_SITE_OTHER): Payer: Self-pay | Admitting: Thoracic Surgery (Cardiothoracic Vascular Surgery)

## 2021-06-01 ENCOUNTER — Encounter: Payer: Self-pay | Admitting: *Deleted

## 2021-06-01 VITALS — BP 123/82 | HR 97 | Resp 20 | Ht 67.0 in | Wt 149.0 lb

## 2021-06-01 DIAGNOSIS — Z902 Acquired absence of lung [part of]: Secondary | ICD-10-CM

## 2021-06-01 DIAGNOSIS — J9811 Atelectasis: Secondary | ICD-10-CM

## 2021-06-01 NOTE — Progress Notes (Signed)
[address]       [place] [address]             [phone]       HPI: Ms. Orrison returns for scheduled follow-up visit  Itzel Mckibbin is a 60 year old woman with a history of tobacco abuse, COPD, lung cancer, hypertension, hyperlipidemia, and type 2 diabetes.  She has a greater than 40-pack-year history of smoking.  Unfortunately continues to smoke about a fourth of a pack of cigarettes daily.  She was found to have a nodule on a low-dose CT for lung cancer screening last year.  The nodule was hypermetabolic on PET.  I did a robotic right upper lobectomy on 09/19/2020.  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  She refused adjuvant therapy.  I last saw her in January 2022.  She was doing well at that time.  She was not taking any narcotics.  She says that she is having some issues with her breathing.  She has not been able to return to work because she cannot lift and move things like she was doing previously.  She had tried to quit smoking but has been able to.  She saw Dr. Rogue Bussing recently and had a CT of the chest.  Past Medical History:  Diagnosis Date   Cancer [facility])    lung cancer   Depression    Duodenitis    Family history of cancer    High cholesterol    Personal history of colonic polyps    Pre-diabetes    Umbilical hernia    UTI (urinary tract infection)     Current Outpatient Medications  Medication Sig Dispense Refill   metFORMIN (GLUCOPHAGE) 500 MG tablet TAKE ONE TABLET BY MOUTH EVERY MORNING WITH BREAKFAST 30 tablet 3   mirtazapine (REMERON) 15 MG tablet Take (1/2) tablet (7.5 mg total) by mouth at bedtime. 30 tablet 0   rosuvastatin (CRESTOR) 10 MG tablet TAKE ONE TABLET BY MOUTH EVERY DAY 30 tablet 3   No current facility-administered medications for this visit.    Physical Exam BP 123/82 (BP Location: Left Arm, Patient Position: Sitting)   Pulse 97   Resp 20    Ht 5\' 7"  (1.702 m)   Wt 149 lb (67.6 kg)   SpO2 91% Comment: RA  BMI 23.65 kg/m  59 year old woman in no acute distress Alert and oriented x3 with no focal deficits No cervical or supraclavicular adenopathy Lungs clear with essentially equal breath sounds bilaterally Cardiac regular rate and rhythm  Diagnostic Tests: CT CHEST WITH CONTRAST   TECHNIQUE: Multidetector CT imaging of the chest was performed during intravenous contrast administration.   CONTRAST:  80mL OMNIPAQUE IOHEXOL 350 MG/ML SOLN   COMPARISON:  CT chest dated August 13, 2020   FINDINGS: Cardiovascular: Normal heart size. Trace pericardial fluid. Three-vessel coronary artery calcifications. Aortic valve calcifications. Severe atherosclerotic disease of the thoracic aorta with areas of irregular soft plaque.   Mediastinum/Nodes: No pathologically enlarged lymph nodes seen in the chest. Full-thickness and thyroid are unremarkable.   Lungs/Pleura: Prior right upper lobectomy. Complete collapse of the right middle lobe. No obstructing hilar lesion is visualized. No new or enlarging pulmonary nodules.   Upper Abdomen: Unchanged left adrenal nodule measuring up to 2.7 cm. Partially visualized small fat containing ventral abdominal wall hernia.   Musculoskeletal: No chest wall abnormality. No acute or significant osseous findings.  IMPRESSION: Prior right upper lobectomy with no evidence of recurrence or metastatic disease.   Complete collapse of the right middle lobe with no obstructing hilar lesion visualized. Recommend attention on follow-up.   Aortic Atherosclerosis (ICD10-I70.0) and Emphysema (ICD10-J43.9).     Electronically Signed   By: Yetta Glassman MD   On: 05/21/2021 14:49   I personally reviewed the CT images.  There is right middle lobe atelectasis.  No evidence of recurrent disease.  Impression: Staysha Truby is a 60 year old woman with a history of tobacco abuse, COPD, lung cancer,  hypertension, hyperlipidemia, and type 2 diabetes.  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  No evidence of recurrent disease.  Tobacco abuse-continues to smoke.  Emphasized the importance of cessation.  She does not seem motivated to quit.  Right middle lobe atelectasis-suspect this is due to obstruction of the right middle lobe bronchus due to rotation of the lower and middle lobes post upper lobectomy.  I recommended we do bronchoscopy to further evaluate and make sure there is no obstructing lesion.  I described the procedure of bronchoscopy to her.  We would plan to do this on an outpatient basis.  The plan would be to do it either with intravenous sedation or general anesthesia per her preference.  She understands we may need to do biopsies.  We discussed the indications, risk, benefits, and alternatives.  She understands this is a relatively low risk procedure but there is always a risk with any medical intervention.  And if she chooses to have general anesthesia the risks associated with that.  General risk of any procedure include MI, DVT, PE, bleeding, and infection.  Plan: Bronchoscopy on 06/25/2021  Melrose Nakayama, MD [facility] [phone]

## 2021-06-04 ENCOUNTER — Ambulatory Visit: Payer: Self-pay | Admitting: Licensed Clinical Social Worker

## 2021-06-04 ENCOUNTER — Other Ambulatory Visit: Payer: Self-pay

## 2021-06-04 DIAGNOSIS — F411 Generalized anxiety disorder: Secondary | ICD-10-CM

## 2021-06-04 DIAGNOSIS — F331 Major depressive disorder, recurrent, moderate: Secondary | ICD-10-CM

## 2021-06-04 NOTE — BH Specialist Note (Signed)
Integrated Behavioral Health Follow Up Telephone Visit  MRN: [id number] Name: Stacie Kennedy  Total time: 60 minutes  Types of Service: Telephone visit Patient consents to telephone visit and 2 patient identifiers were used to identify patient   Interpretor:No. Interpretor Name and Language: N/A  Subjective: Stacie Kennedy is a 60 y.o. female accompanied by  herself Patient was referred by Carlyon Shadow, NP  for mental health. Patient reports the following symptoms/concerns: The patient reports that she has been doing okay since her last appointment. She explained that she has felt more depressed this week because her first cousin's son passed away unexpectedly. She noted that she has had very little energy and is sleeping to much. She noted she only eats once in the evening and has lost 10 pounds recently. She requested clinician check with her primary care doctor to see if there is anything they can give her to help her stop the weight loss. The patient explained that he often worries and feels on edge everyday. She noted she does not have anyone she can depend in to help her. The patient discussed financial and health stressors impacting her life. She shared that she is considering filing for disability but is not sure how to start the process. She stated that the social worker at [facility] gave her resources to call but she has not called them yet. The patient denied any suicidal or homicidal thoughts.  Duration of problem: Years; Severity of problem: moderate  Objective: Mood: Depressed and Affect: Appropriate Risk of harm to self or others: No plan to harm self or others  Life Context: Family and Social: see above School/Work: see above Self-Care: see above Life Changes: see above  Patient and/or Family's Strengths/Protective Factors: Concrete supports in place (healthy food, safe environments, etc.)  Goals Addressed: Patient will:  Reduce symptoms of: anxiety, depression,  insomnia, and stress   Increase knowledge and/or ability of: coping skills, healthy habits, self-management skills, and stress reduction   Demonstrate ability to: Increase healthy adjustment to current life circumstances  Progress towards Goals: Ongoing  Interventions: Interventions utilized:  Supportive Counseling was utilized by the clinician during today's follow up session. The clinician processed with the patient how they have been doing since the last follow-up session. The clinician provided a space for the patient to ventilate their frustrations regarding their current life circumstances. Clinician measured the patient's anxiety and depression on a numerical scale. Clinician offered to take her for case consultation with Dr. Octavia Heir, MD, Psychiatric Consultant on Tuesday  06/09/2021 at 11:00 AM. Clinician encouraged the patient to follow-up with the referrals the social worker from [facility] provided. Clinician offered her condolences to the patient on her loss. Standardized Assessments completed: GAD-7 and PHQ 9 GAD-7     12 PHQ-9     14  Assessment: Patient currently experiencing see above.   Patient may benefit from see above.  Plan: Follow up with behavioral health clinician on : 06/09/2021 at 2:30 PM  Behavioral recommendations:  Referral(s): [place] (In Clinic) "From scale of 1-10, how likely are you to follow plan?":   Lesli Albee, LCSWA

## 2021-06-09 ENCOUNTER — Ambulatory Visit: Payer: Self-pay | Admitting: Licensed Clinical Social Worker

## 2021-06-09 ENCOUNTER — Other Ambulatory Visit: Payer: Self-pay

## 2021-06-09 DIAGNOSIS — F411 Generalized anxiety disorder: Secondary | ICD-10-CM

## 2021-06-09 DIAGNOSIS — F331 Major depressive disorder, recurrent, moderate: Secondary | ICD-10-CM

## 2021-06-09 NOTE — BH Specialist Note (Signed)
Integrated Behavioral Health Follow Up Telephone Visit  MRN: [id number] Name: Stacie Kennedy   Total time: 30 minutes  Types of Service: Telephone visit was utilized by the clinician during today's follow up session. The clinician processed with the patient how they have been doing since the last follow-up session. The clinician provided a space for the patient to ventilate their frustrations regarding their current life circumstances. Clinician measured the patient's anxiety and depression on a numerical scale.    Interpretor:No. Interpretor Name and Language: N/A  Subjective: Stacie Kennedy is a 60 y.o. female accompanied by  herself Patient was referred by Stacie Shadow, NP for Mental Health. Patient reports the following symptoms/concerns: The patient reports that she has been about the same since her last follow-up appointment. She shared that she had forgotten about her appointment today and was on her way to her family members funneral visitation. She requested information regarding her upcoming appointments. She noted that she was concerned that she has lost 9 pounds in the last two months. She discussed financial stressors impacting her life.The patient denied any suicidal or homicidal thoughts.  Duration of problem: Years; Severity of problem: moderate  Objective: Mood: Euthymic and Affect: Appropriate Risk of harm to self or others: No plan to harm self or others  Life Context: Family and Social: see above School/Work: see above Self-Care: see above Life Changes: see above  Patient and/or Family's Strengths/Protective Factors: Concrete supports in place (healthy food, safe environments, etc.)  Goals Addressed: Patient will:  Reduce symptoms of: agitation, anxiety, depression, insomnia, and stress   Increase knowledge and/or ability of: coping skills, healthy habits, self-management skills, and stress reduction   Demonstrate ability to: Increase healthy adjustment to  current life circumstances and Increase adequate support systems for patient/family  Progress towards Goals: Ongoing  Interventions: Interventions utilized:  Supportive Counseling was utilized by the clinician during today's follow up session. The clinician processed with the patient how they have been doing since the last follow-up session. The clinician provided a space for the patient to ventilate their frustrations regarding their current life circumstances. Clinician measured the patient's anxiety and depression on a numerical scale. The clinician encouraged the patient to utilize their coping skills to deal with their current life circumstances.  Clinician offered her condolences on her loss and rescheduled the patients appointment.    Standardized Assessments completed: GAD-7 and PHQ 9 GAD-7    11 PHQ-9    13  Assessment: Patient currently experiencing see above.   Patient may benefit from see above.  Plan: Follow up with behavioral health clinician on : 06/18/2021 at 2:00 PM  Behavioral recommendations:  Referral(s): [place] (In Clinic) "From scale of 1-10, how likely are you to follow plan?":   Stacie Kennedy, LCSWA

## 2021-06-16 ENCOUNTER — Other Ambulatory Visit: Payer: Self-pay | Admitting: Gerontology

## 2021-06-16 ENCOUNTER — Other Ambulatory Visit: Payer: Self-pay

## 2021-06-16 DIAGNOSIS — F331 Major depressive disorder, recurrent, moderate: Secondary | ICD-10-CM

## 2021-06-16 MED ORDER — MIRTAZAPINE 15 MG PO TABS
15.0000 mg | ORAL_TABLET | Freq: Every day | ORAL | 0 refills | Status: DC
  Filled 2021-06-16: qty 30, 30d supply, fill #0

## 2021-06-18 ENCOUNTER — Ambulatory Visit: Payer: Self-pay | Admitting: Licensed Clinical Social Worker

## 2021-06-18 ENCOUNTER — Encounter: Payer: Self-pay | Admitting: Licensed Clinical Social Worker

## 2021-06-18 ENCOUNTER — Telehealth: Payer: Self-pay | Admitting: Licensed Clinical Social Worker

## 2021-06-18 ENCOUNTER — Other Ambulatory Visit: Payer: Self-pay

## 2021-06-18 NOTE — Telephone Encounter (Signed)
Called the patient to let her know her appointment today was moved to 2:30 PM due to a scheduling error.

## 2021-06-22 NOTE — Progress Notes (Signed)
Surgical Instructions    Your procedure is scheduled on Thursday September 15th.  Report to [facility] Main Entrance "A" at 6 A.M., then check in with the Admitting office.  Call this number if you have problems the morning of surgery:  [phone]   If you have any questions prior to your surgery date call [phone]: Open Monday-Friday 8am-4pm    Remember:  Do not eat or drink anything after midnight the night before your surgery     Take these medicines the morning of surgery with A SIP OF WATER rosuvastatin (CRESTOR) 10 MG tablet   As of today, STOP taking any Aspirin (unless otherwise instructed by your surgeon) Aleve, Naproxen, Ibuprofen, Motrin, Advil, Goody's, BC's, all herbal medications, fish oil, and all vitamins.   WHAT DO I DO ABOUT MY DIABETES MEDICATION?   Do not take oral diabetes medicines (METFORMIN) the morning of surgery.   HOW TO MANAGE YOUR DIABETES BEFORE AND AFTER SURGERY  Why is it important to control my blood sugar before and after surgery? Improving blood sugar levels before and after surgery helps healing and can limit problems. A way of improving blood sugar control is eating a healthy diet by:  Eating less sugar and carbohydrates  Increasing activity/exercise  Talking with your doctor about reaching your blood sugar goals High blood sugars (greater than 180 mg/dL) can raise your risk of infections and slow your recovery, so you will need to focus on controlling your diabetes during the weeks before surgery. Make sure that the doctor who takes care of your diabetes knows about your planned surgery including the date and location.  How do I manage my blood sugar before surgery? Check your blood sugar at least 4 times a day, starting 2 days before surgery, to make sure that the level is not too high or low.  Check your blood sugar the morning of your surgery when you wake up and every 2 hours until you get to the Short Stay unit.  If your  blood sugar is less than 70 mg/dL, you will need to treat for low blood sugar: Do not take insulin. Treat a low blood sugar (less than 70 mg/dL) with  cup of clear juice (cranberry or apple), 4 glucose tablets, OR glucose gel. Recheck blood sugar in 15 minutes after treatment (to make sure it is greater than 70 mg/dL). If your blood sugar is not greater than 70 mg/dL on recheck, call [phone] for further instructions. Report your blood sugar to the short stay nurse when you get to Short Stay.  If you are admitted to the hospital after surgery: Your blood sugar will be checked by the staff and you will probably be given insulin after surgery (instead of oral diabetes medicines) to make sure you have good blood sugar levels. The goal for blood sugar control after surgery is 80-180 mg/dL.          Do not wear jewelry or makeup Do not wear lotions, powders, perfumes, or deodorant. Do not shave 48 hours prior to surgery.   Do not bring valuables to the hospital. DO Not wear nail polish, gel polish, artificial nails, or any other type of covering on natural nails including finger and toenails. If patients have artificial nails, gel coating, etc. that need to be removed by a nail salon please have this removed prior to surgery or surgery may need to be canceled/delayed if the surgeon/ anesthesia feels like the patient is unable to be adequately monitored.  [facility] is not responsible for any belongings or valuables.  Do NOT Smoke (Tobacco/Vaping)  24 hours prior to your procedure If you use a CPAP at night, you may bring your mask for your overnight stay.   Contacts, glasses, dentures or bridgework may not be worn into surgery, please bring cases for these belongings   For patients admitted to the hospital, discharge time will be determined by your treatment team.   Patients discharged the day of surgery will not be allowed to drive home, and someone needs to stay with them for  24 hours.  ONLY 1 SUPPORT PERSON MAY BE PRESENT WHILE YOU ARE IN SURGERY. IF YOU ARE TO BE ADMITTED ONCE YOU ARE IN YOUR ROOM YOU WILL BE ALLOWED TWO (2) VISITORS.  Minor children may have two parents present. Special consideration for safety and communication needs will be reviewed on a case by case basis.  Special instructions:    Oral Hygiene is also important to reduce your risk of infection.  Remember - BRUSH YOUR TEETH THE MORNING OF SURGERY WITH YOUR REGULAR TOOTHPASTE   [facility]- Preparing For Surgery  Before surgery, you can play an important role. Because skin is not sterile, your skin needs to be as free of germs as possible. You can reduce the number of germs on your skin by washing with CHG (chlorahexidine gluconate) Soap before surgery.  CHG is an antiseptic cleaner which kills germs and bonds with the skin to continue killing germs even after washing.     Please do not use if you have an allergy to CHG or antibacterial soaps. If your skin becomes reddened/irritated stop using the CHG.  Do not shave (including legs and underarms) for at least 48 hours prior to first CHG shower. It is OK to shave your face.  Please follow these instructions carefully.     Shower the NIGHT BEFORE SURGERY and the MORNING OF SURGERY with CHG Soap.   If you chose to wash your hair, wash your hair first as usual with your normal shampoo. After you shampoo, rinse your hair and body thoroughly to remove the shampoo.  Then [employer] and genitals (private parts) with your normal soap and rinse thoroughly to remove soap.  After that Use CHG Soap as you would any other liquid soap. You can apply CHG directly to the skin and wash gently with a scrungie or a clean washcloth.   Apply the CHG Soap to your body ONLY FROM THE NECK DOWN.  Do not use on open wounds or open sores. Avoid contact with your eyes, ears, mouth and genitals (private parts). Wash Face and genitals (private parts)  with your normal  soap.   Wash thoroughly, paying special attention to the area where your surgery will be performed.  Thoroughly rinse your body with warm water from the neck down.  DO NOT shower/wash with your normal soap after using and rinsing off the CHG Soap.  Pat yourself dry with a CLEAN TOWEL.  Wear CLEAN PAJAMAS to bed the night before surgery  Place CLEAN SHEETS on your bed the night before your surgery  DO NOT SLEEP WITH PETS.   Day of Surgery:  Take a shower with CHG soap. Wear Clean/Comfortable clothing the morning of surgery Do not apply any deodorants/lotions.   Remember to brush your teeth WITH YOUR REGULAR TOOTHPASTE.   Please read over the following fact sheets that you were given.

## 2021-06-23 ENCOUNTER — Encounter (HOSPITAL_COMMUNITY)
Admission: RE | Admit: 2021-06-23 | Discharge: 2021-06-23 | Disposition: A | Discharge status: Home / Self Care | Payer: Self-pay | Source: Ambulatory Visit | Attending: Thoracic Surgery (Cardiothoracic Vascular Surgery) | Admitting: Thoracic Surgery (Cardiothoracic Vascular Surgery)

## 2021-06-23 ENCOUNTER — Other Ambulatory Visit: Payer: Self-pay | Admitting: Gerontology

## 2021-06-23 ENCOUNTER — Other Ambulatory Visit: Payer: Self-pay

## 2021-06-23 DIAGNOSIS — F331 Major depressive disorder, recurrent, moderate: Secondary | ICD-10-CM

## 2021-06-23 MED ORDER — BUPROPION HCL ER (SR) 150 MG PO TB12
150.0000 mg | ORAL_TABLET | Freq: Two times a day (BID) | ORAL | 0 refills | Status: DC
  Filled 2021-06-23: qty 60, 30d supply, fill #0

## 2021-06-23 NOTE — Progress Notes (Signed)
Surgical Instructions    Your procedure is scheduled on Thursday September 15th.  Report to [facility] Main Entrance "A" at 6 A.M., then check in with the Admitting office.  Call this number if you have problems the morning of surgery:  [phone]   If you have any questions prior to your surgery date call [phone]: Open Monday-Friday 8am-4pm    Remember:  Do not eat or drink anything after midnight the night before your surgery     Take these medicines the morning of surgery with A SIP OF WATER rosuvastatin (CRESTOR) 10 MG tablet buPROPion [facility] SR)  As of today, STOP taking any Aspirin (unless otherwise instructed by your surgeon) Aleve, Naproxen, Ibuprofen, Motrin, Advil, Goody's, BC's, all herbal medications, fish oil, and all vitamins.   WHAT DO I DO ABOUT MY DIABETES MEDICATION?   Do not take oral diabetes medicines (METFORMIN) the morning of surgery.   HOW TO MANAGE YOUR DIABETES BEFORE AND AFTER SURGERY  Why is it important to control my blood sugar before and after surgery? Improving blood sugar levels before and after surgery helps healing and can limit problems. A way of improving blood sugar control is eating a healthy diet by:  Eating less sugar and carbohydrates  Increasing activity/exercise  Talking with your doctor about reaching your blood sugar goals High blood sugars (greater than 180 mg/dL) can raise your risk of infections and slow your recovery, so you will need to focus on controlling your diabetes during the weeks before surgery. Make sure that the doctor who takes care of your diabetes knows about your planned surgery including the date and location.  How do I manage my blood sugar before surgery? Check your blood sugar at least 4 times a day, starting 2 days before surgery, to make sure that the level is not too high or low.  Check your blood sugar the morning of your surgery when you wake up and every 2 hours until you get to the Short  Stay unit.  If your blood sugar is less than 70 mg/dL, you will need to treat for low blood sugar: Do not take insulin. Treat a low blood sugar (less than 70 mg/dL) with  cup of clear juice (cranberry or apple), 4 glucose tablets, OR glucose gel. Recheck blood sugar in 15 minutes after treatment (to make sure it is greater than 70 mg/dL). If your blood sugar is not greater than 70 mg/dL on recheck, call [phone] for further instructions. Report your blood sugar to the short stay nurse when you get to Short Stay.  If you are admitted to the hospital after surgery: Your blood sugar will be checked by the staff and you will probably be given insulin after surgery (instead of oral diabetes medicines) to make sure you have good blood sugar levels. The goal for blood sugar control after surgery is 80-180 mg/dL.          Do not wear jewelry or makeup Do not wear lotions, powders, perfumes, or deodorant. Do not shave 48 hours prior to surgery.   Do not bring valuables to the hospital. DO Not wear nail polish, gel polish, artificial nails, or any other type of covering on natural nails including finger and toenails. If patients have artificial nails, gel coating, etc. that need to be removed by a nail salon please have this removed prior to surgery or surgery may need to be canceled/delayed if the surgeon/ anesthesia feels like the patient is unable to be adequately  monitored.             [facility] is not responsible for any belongings or valuables.  Do NOT Smoke (Tobacco/Vaping)  24 hours prior to your procedure If you use a CPAP at night, you may bring your mask for your overnight stay.   Contacts, glasses, dentures or bridgework may not be worn into surgery, please bring cases for these belongings   For patients admitted to the hospital, discharge time will be determined by your treatment team.   Patients discharged the day of surgery will not be allowed to drive home, and someone needs  to stay with them for 24 hours.  ONLY 1 SUPPORT PERSON MAY BE PRESENT WHILE YOU ARE IN SURGERY. IF YOU ARE TO BE ADMITTED ONCE YOU ARE IN YOUR ROOM YOU WILL BE ALLOWED TWO (2) VISITORS.  Minor children may have two parents present. Special consideration for safety and communication needs will be reviewed on a case by case basis.  Special instructions:    Oral Hygiene is also important to reduce your risk of infection.  Remember - BRUSH YOUR TEETH THE MORNING OF SURGERY WITH YOUR REGULAR TOOTHPASTE   [facility]- Preparing For Surgery  Before surgery, you can play an important role. Because skin is not sterile, your skin needs to be as free of germs as possible. You can reduce the number of germs on your skin by washing with CHG (chlorahexidine gluconate) Soap before surgery.  CHG is an antiseptic cleaner which kills germs and bonds with the skin to continue killing germs even after washing.     Please do not use if you have an allergy to CHG or antibacterial soaps. If your skin becomes reddened/irritated stop using the CHG.  Do not shave (including legs and underarms) for at least 48 hours prior to first CHG shower. It is OK to shave your face.  Please follow these instructions carefully.     Shower the NIGHT BEFORE SURGERY and the MORNING OF SURGERY with CHG Soap.   If you chose to wash your hair, wash your hair first as usual with your normal shampoo. After you shampoo, rinse your hair and body thoroughly to remove the shampoo.  Then [employer] and genitals (private parts) with your normal soap and rinse thoroughly to remove soap.  After that Use CHG Soap as you would any other liquid soap. You can apply CHG directly to the skin and wash gently with a scrungie or a clean washcloth.   Apply the CHG Soap to your body ONLY FROM THE NECK DOWN.  Do not use on open wounds or open sores. Avoid contact with your eyes, ears, mouth and genitals (private parts). Wash Face and genitals (private parts)   with your normal soap.   Wash thoroughly, paying special attention to the area where your surgery will be performed.  Thoroughly rinse your body with warm water from the neck down.  DO NOT shower/wash with your normal soap after using and rinsing off the CHG Soap.  Pat yourself dry with a CLEAN TOWEL.  Wear CLEAN PAJAMAS to bed the night before surgery  Place CLEAN SHEETS on your bed the night before your surgery  DO NOT SLEEP WITH PETS.   Day of Surgery:  Take a shower with CHG soap. Wear Clean/Comfortable clothing the morning of surgery Do not apply any deodorants/lotions.   Remember to brush your teeth WITH YOUR REGULAR TOOTHPASTE.   Please read over the following fact sheets that  you were given.

## 2021-06-23 NOTE — Progress Notes (Signed)
Patient diid not come for scheduled 1pm PAT appointment today. Called patient;no answer. Left voicemail for patient to call PAT office.

## 2021-06-24 ENCOUNTER — Other Ambulatory Visit: Payer: Self-pay

## 2021-06-24 ENCOUNTER — Other Ambulatory Visit: Admission: RE | Admit: 2021-06-24 | Payer: MEDICAID | Source: Ambulatory Visit

## 2021-06-24 ENCOUNTER — Encounter (HOSPITAL_COMMUNITY): Payer: Self-pay | Admitting: Anesthesiology

## 2021-06-24 ENCOUNTER — Ambulatory Visit: Payer: Self-pay | Admitting: Gerontology

## 2021-06-24 ENCOUNTER — Encounter (HOSPITAL_COMMUNITY): Payer: Self-pay | Admitting: Thoracic Surgery (Cardiothoracic Vascular Surgery)

## 2021-06-24 NOTE — Anesthesia Preprocedure Evaluation (Deleted)
Anesthesia Evaluation    Reviewed: Allergy & Precautions, Patient's Chart, lab work & pertinent test results  History of Anesthesia Complications Negative for: history of anesthetic complications  Airway        Dental   Pulmonary Current Smoker,   S/p lobectomy           Cardiovascular negative cardio ROS       Neuro/Psych negative neurological ROS  negative psych ROS   GI/Hepatic negative GI ROS, Neg liver ROS,   Endo/Other  diabetes, Type 2, Oral Hypoglycemic Agents  Renal/GU negative Renal ROS     Musculoskeletal negative musculoskeletal ROS (+)   Abdominal   Peds  Hematology negative hematology ROS (+)   Anesthesia Other Findings   Reproductive/Obstetrics                             Anesthesia Physical Anesthesia Plan  ASA: 2  Anesthesia Plan: General   Post-op Pain Management:    Induction: Intravenous  PONV Risk Score and Plan: 3 and Treatment may vary due to age or medical condition, Ondansetron, Dexamethasone and Midazolam  Airway Management Planned: Oral ETT  Additional Equipment: None  Intra-op Plan:   Post-operative Plan: Extubation in OR  Informed Consent:   Plan Discussed with: CRNA and Anesthesiologist  Anesthesia Plan Comments:         Anesthesia Quick Evaluation

## 2021-06-24 NOTE — Progress Notes (Signed)
PCP - [facility]  Cardiologist - Denies  EP-Denies  Endocrine-Denies  Pulm-Denies  Chest x-ray - 06/25/21- DOS  EKG - 09/17/20 (E)  Stress Test - Denies  ECHO - Denies  Cardiac Cath - Denies  AICD-na PM-na LOOP-na  Dialysis-Denies  Sleep Study - Denies CPAP - Denies  LABS- 06/25/21: CBC, CMP, PT, PTT, COVID  ASA- Denies  ERAS- No  HA1C- 11/26/20: 6.2 (E) Checks Blood Sugar ___0__ times a day- Pt states she is a Type II Diabetic, but she takes Metformin daily, and does not need to check her blood sugars at home.  Anesthesia- No  Pt denies having chest pain, sob, or fever during the pre-op phone call. All instructions explained to the pt, with a verbal understanding of the material including: as of today, stop taking all Aspirin (unless instructed by your doctor) and Other Aspirin containing products, Vitamins, Fish oils, and Herbal medications. Also stop all NSAIDS i.e. Advil, Ibuprofen, Motrin, Aleve, Anaprox, Naproxen, BC, Goody Powders, and all Supplements. Pt also instructed to wear a mask and social distance if she has to go out prior to her procedure. The opportunity to ask questions was provided.    Coronavirus Screening  Have you experienced the following symptoms:  Cough yes/no: No Fever (>100.68F)  yes/no: No Runny nose yes/no: No Sore throat yes/no: No Difficulty breathing/shortness of breath  yes/no: No  Have you or a family member traveled in the last 14 days and where? yes/no: No   If the patient indicates "YES" to the above questions, their PAT will be rescheduled to limit the exposure to others and, the surgeon will be notified. THE PATIENT WILL NEED TO BE ASYMPTOMATIC FOR 14 DAYS.   If the patient is not experiencing any of these symptoms, the PAT nurse will instruct them to NOT bring anyone with them to their appointment since they may have these symptoms or traveled as well.   Please remind your patients and families that hospital  visitation restrictions are in effect and the importance of the restrictions.

## 2021-06-25 ENCOUNTER — Ambulatory Visit (HOSPITAL_COMMUNITY)
Admission: RE | Admit: 2021-06-25 | Payer: Self-pay | Source: Home / Self Care | Admitting: Thoracic Surgery (Cardiothoracic Vascular Surgery)

## 2021-06-25 ENCOUNTER — Other Ambulatory Visit: Payer: Self-pay | Admitting: *Deleted

## 2021-06-25 ENCOUNTER — Encounter: Payer: Self-pay | Admitting: *Deleted

## 2021-06-25 HISTORY — DX: Dyspnea, unspecified: R06.00

## 2021-06-25 SURGERY — BRONCHOSCOPY, VIDEO-ASSISTED
Anesthesia: General

## 2021-06-30 ENCOUNTER — Ambulatory Visit: Payer: Self-pay | Admitting: Gerontology

## 2021-06-30 ENCOUNTER — Ambulatory Visit: Payer: Self-pay | Admitting: Licensed Clinical Social Worker

## 2021-07-02 ENCOUNTER — Ambulatory Visit: Payer: Self-pay | Admitting: Licensed Clinical Social Worker

## 2021-07-02 ENCOUNTER — Telehealth: Payer: Self-pay | Admitting: Licensed Clinical Social Worker

## 2021-07-06 ENCOUNTER — Other Ambulatory Visit: Payer: Self-pay | Admitting: *Deleted

## 2021-07-06 ENCOUNTER — Other Ambulatory Visit: Payer: Self-pay

## 2021-07-06 ENCOUNTER — Encounter (HOSPITAL_COMMUNITY): Payer: Self-pay | Admitting: Thoracic Surgery (Cardiothoracic Vascular Surgery)

## 2021-07-06 DIAGNOSIS — J9811 Atelectasis: Secondary | ICD-10-CM

## 2021-07-06 NOTE — Progress Notes (Addendum)
Spoke with pt for pre-op call. Pt denies cardiac history. Pt is treated for high cholesterol and depression.  Pt will need a Covid test on arrival.

## 2021-07-07 ENCOUNTER — Ambulatory Visit: Payer: Self-pay | Admitting: Licensed Clinical Social Worker

## 2021-07-07 DIAGNOSIS — F411 Generalized anxiety disorder: Secondary | ICD-10-CM

## 2021-07-07 DIAGNOSIS — F331 Major depressive disorder, recurrent, moderate: Secondary | ICD-10-CM

## 2021-07-07 NOTE — BH Specialist Note (Signed)
Integrated Behavioral Health Follow Up Telephone Visit  MRN: [id number] Name: Stacie Kennedy   Total time: 60 minutes  Types of Service: Telephone visit Patient consents to telephone visit and 2 patient identifiers were used to identify patient   Interpretor:No. Interpretor Name and Language: N/A  Subjective: Stacie Kennedy is a 60 y.o. female accompanied by  herself Patient was referred by Stacie Shadow, NP for Mental Health. Patient reports the following symptoms/concerns: The patient noted that she has been the same since her last follow-up session. She explained that she is still crying throughout the day without any apparent reason. She stated that she feels depressed and discussed financial stressors impacting her life. She shared that she received a letter regarding her disability claim that stated she had to respond by October the first, but when she called she spent hours on hold and no one ever answered. She shared that she is nervous about undergoing a procedure tomorrow, but her friend has agreed to accompany her. She shared that her doctor told her that some scans showed a possible spot that may be a tumor on her lung but that he thinks it is probably something else. Ms. Roselli shared that she is still losing weight and is needs clothes that fit. She stated that everything she has falls off her now. The patient asked for referrals to agencies that may offer her financial support. Ms. Geers reported that she was agreeable to the psychiatric case consultation recommendations regarding discontinuing Mirtazapine 7.5 MG  and starting Bupropion (Wellbutrin SR) 150 MG. Ms. Pohlman denied any suicidal or homicidal thoughts.  Duration of problem: Years; Severity of problem: moderate  Objective: Mood: Euthymic and Affect: Appropriate Risk of harm to self or others: No plan to harm self or others  Life Context: Family and Social: see above School/Work: see above Self-Care: see above Life Changes:  see above  Patient and/or Family's Strengths/Protective Factors: Social and Emotional competence and Concrete supports in place (healthy food, safe environments, etc.)  Goals Addressed: Patient will:  Reduce symptoms of: anxiety, depression, and stress   Increase knowledge and/or ability of: coping skills, healthy habits, self-management skills, and stress reduction   Demonstrate ability to: Increase healthy adjustment to current life circumstances and Increase adequate support systems for patient/family  Progress towards Goals: Ongoing  Interventions: Interventions utilized:  Supportive Counseling was utilized by the clinician during today's follow up session. The clinician processed with the patient how they have been doing since the last follow-up session. The clinician provided a space for the patient to ventilate their frustrations regarding their current life circumstances. Clinician measured the patient's anxiety and depression on a numerical scale. Clinician encouraged the client to talk to her doctor tomorrow about any concerns she may have regarding the procedure and reassured the patient that it is common to feel nervous the night before. Clinician encouraged the client to use her coping skills to help her deal with her current life circumstances. Clinician made an appointment with the MSW student at [facility] to assess for case management needs and make appropriate referrals to community resources. Clinician utilized guidance and supervision, provided by psychiatric consultant, to inform patient of the benefits and risks of psychotropic medication use including a verbal summary of [place] warnings. Patient was given the opportunity to ask questions and address concerns regarding the psychiatric consultants recommendations for treatment  Standardized Assessments completed: GAD-7 and PHQ 9 PHQ-9 =  18 GAD-7 =  14  Assessment: Patient currently  experiencing see above.    Patient may benefit from see above.  Plan: Follow up with behavioral health clinician on : 07/16/2021 at 4:00 PM  Behavioral recommendations:  Referral(s): [facility] (In Clinic) "From scale of 1-10, how likely are you to follow plan?":   Lesli Albee, LCSWA

## 2021-07-07 NOTE — Anesthesia Preprocedure Evaluation (Addendum)
Anesthesia Evaluation  Patient identified by MRN, date of birth, ID band  Reviewed: Allergy & Precautions, NPO status , Patient's Chart, lab work & pertinent test results  Airway Mallampati: II  TM Distance: >3 FB Neck ROM: Full    Dental no notable dental hx. (+) Dental Advisory Given   Pulmonary shortness of breath, Current Smoker and Patient abstained from smoking.,    Pulmonary exam normal breath sounds clear to auscultation       Cardiovascular negative cardio ROS Normal cardiovascular exam Rhythm:Regular Rate:Normal     Neuro/Psych PSYCHIATRIC DISORDERS Depression negative neurological ROS     GI/Hepatic negative GI ROS, Neg liver ROS,   Endo/Other  negative endocrine ROS  Renal/GU negative Renal ROS     Musculoskeletal negative musculoskeletal ROS (+)   Abdominal   Peds  Hematology negative hematology ROS (+)   Anesthesia Other Findings   Reproductive/Obstetrics                                                             Anesthesia Evaluation  Patient identified by MRN, date of birth, ID band Patient awake  General Assessment Comment: S/p thoracotomy + lobectomy for lung cancer 09/2020  Reviewed: Allergy & Precautions, NPO status , Patient's Chart, lab work & pertinent test results  History of Anesthesia Complications Negative for: history of anesthetic complications  Airway Mallampati: III  TM Distance: >3 FB Neck ROM: Full    Dental no notable dental hx. (+) Poor Dentition, Missing,    Pulmonary shortness of breath and at rest, neg sleep apnea, neg COPD, Current Smoker and Patient abstained from smoking.,     + decreased breath sounds      Cardiovascular Exercise Tolerance: Poor METS(-) hypertension(-) CAD and (-) Past MI (-) dysrhythmias  Rhythm:Regular Rate:Normal - Systolic murmurs    Neuro/Psych PSYCHIATRIC DISORDERS Depression negative neurological  ROS     GI/Hepatic neg GERD  ,(+)     (-) substance abuse  ,   Endo/Other  neg diabetes  Renal/GU negative Renal ROS     Musculoskeletal   Abdominal   Peds  Hematology   Anesthesia Other Findings Past Medical History: No date: Cancer ([place])     Comment:  lung cancer No date: Depression No date: Duodenitis No date: High cholesterol No date: Pre-diabetes No date: Umbilical hernia No date: UTI (urinary tract infection)  Reproductive/Obstetrics                             Anesthesia Physical Anesthesia Plan  ASA: 3  Anesthesia Plan: General   Post-op Pain Management:    Induction: Intravenous  PONV Risk Score and Plan: 2 and Ondansetron, Propofol infusion and TIVA  Airway Management Planned: Nasal Cannula  Additional Equipment: None  Intra-op Plan:   Post-operative Plan:   Informed Consent: I have reviewed the patients History and Physical, chart, labs and discussed the procedure including the risks, benefits and alternatives for the proposed anesthesia with the patient or authorized representative who has indicated his/her understanding and acceptance.     Dental advisory given  Plan Discussed with: CRNA and Surgeon  Anesthesia Plan Comments: (Discussed risks of anesthesia with patient, including possibility of difficulty with spontaneous ventilation under anesthesia necessitating airway intervention, PONV,  and rare risks such as cardiac or respiratory or neurological events. Patient understands.)        Anesthesia Quick Evaluation  Anesthesia Physical Anesthesia Plan  ASA: 2  Anesthesia Plan: General   Post-op Pain Management:    Induction: Intravenous  PONV Risk Score and Plan: 2 and Ondansetron, Dexamethasone, Treatment may vary due to age or medical condition and Midazolam  Airway Management Planned: Oral ETT  Additional Equipment: None  Intra-op Plan:   Post-operative Plan: Extubation in OR  Informed  Consent: I have reviewed the patients History and Physical, chart, labs and discussed the procedure including the risks, benefits and alternatives for the proposed anesthesia with the patient or authorized representative who has indicated his/her understanding and acceptance.     Dental advisory given  Plan Discussed with: CRNA  Anesthesia Plan Comments:         Anesthesia Quick Evaluation

## 2021-07-08 ENCOUNTER — Ambulatory Visit (HOSPITAL_COMMUNITY)
Admission: RE | Admit: 2021-07-08 | Discharge: 2021-07-08 | Disposition: A | Discharge status: Home / Self Care | Payer: Medicaid Other | Attending: Thoracic Surgery (Cardiothoracic Vascular Surgery) | Admitting: Thoracic Surgery (Cardiothoracic Vascular Surgery)

## 2021-07-08 ENCOUNTER — Encounter (HOSPITAL_COMMUNITY): Payer: Self-pay | Admitting: Thoracic Surgery (Cardiothoracic Vascular Surgery)

## 2021-07-08 ENCOUNTER — Ambulatory Visit (HOSPITAL_COMMUNITY): Payer: Medicaid Other | Admitting: Anesthesiology

## 2021-07-08 ENCOUNTER — Other Ambulatory Visit: Payer: Self-pay

## 2021-07-08 ENCOUNTER — Encounter (HOSPITAL_COMMUNITY)
Admission: RE | Disposition: A | Discharge status: Home / Self Care | Payer: Self-pay | Source: Home / Self Care | Attending: Thoracic Surgery (Cardiothoracic Vascular Surgery)

## 2021-07-08 ENCOUNTER — Ambulatory Visit (HOSPITAL_COMMUNITY): Payer: Medicaid Other

## 2021-07-08 DIAGNOSIS — E119 Type 2 diabetes mellitus without complications: Secondary | ICD-10-CM | POA: Insufficient documentation

## 2021-07-08 DIAGNOSIS — I10 Essential (primary) hypertension: Secondary | ICD-10-CM | POA: Insufficient documentation

## 2021-07-08 DIAGNOSIS — Z20822 Contact with and (suspected) exposure to covid-19: Secondary | ICD-10-CM | POA: Insufficient documentation

## 2021-07-08 DIAGNOSIS — Z79899 Other long term (current) drug therapy: Secondary | ICD-10-CM | POA: Diagnosis not present

## 2021-07-08 DIAGNOSIS — J9811 Atelectasis: Secondary | ICD-10-CM | POA: Insufficient documentation

## 2021-07-08 DIAGNOSIS — C3411 Malignant neoplasm of upper lobe, right bronchus or lung: Secondary | ICD-10-CM | POA: Diagnosis not present

## 2021-07-08 DIAGNOSIS — Z902 Acquired absence of lung [part of]: Secondary | ICD-10-CM | POA: Insufficient documentation

## 2021-07-08 DIAGNOSIS — F17210 Nicotine dependence, cigarettes, uncomplicated: Secondary | ICD-10-CM | POA: Insufficient documentation

## 2021-07-08 DIAGNOSIS — Z7984 Long term (current) use of oral hypoglycemic drugs: Secondary | ICD-10-CM | POA: Insufficient documentation

## 2021-07-08 DIAGNOSIS — E7800 Pure hypercholesterolemia, unspecified: Secondary | ICD-10-CM | POA: Diagnosis not present

## 2021-07-08 HISTORY — PX: VIDEO BRONCHOSCOPY: SHX5072

## 2021-07-08 LAB — SARS CORONAVIRUS 2 BY RT PCR (HOSPITAL ORDER, PERFORMED IN ~~LOC~~ HOSPITAL LAB): SARS Coronavirus 2: NEGATIVE

## 2021-07-08 LAB — GLUCOSE, CAPILLARY: Glucose-Capillary: 108 mg/dL — ABNORMAL HIGH (ref 70–99)

## 2021-07-08 SURGERY — BRONCHOSCOPY, VIDEO-ASSISTED
Anesthesia: General | Site: Bronchus

## 2021-07-08 MED ORDER — ORAL CARE MOUTH RINSE: 15.0000 mL | Freq: Once | OROMUCOSAL | Status: AC

## 2021-07-08 MED ORDER — LIDOCAINE 2% (20 MG/ML) 5 ML SYRINGE
INTRAMUSCULAR | Status: DC | PRN
  Administered 2021-07-08: 60 mg via INTRAVENOUS

## 2021-07-08 MED ORDER — MEPERIDINE HCL 25 MG/ML IJ SOLN: 6.2500 mg | INTRAMUSCULAR | Status: DC | PRN

## 2021-07-08 MED ORDER — PROMETHAZINE HCL 25 MG/ML IJ SOLN: 6.2500 mg | INTRAMUSCULAR | Status: DC | PRN

## 2021-07-08 MED ORDER — DEXAMETHASONE SODIUM PHOSPHATE 10 MG/ML IJ SOLN
INTRAMUSCULAR | Status: AC
  Filled 2021-07-08: qty 1

## 2021-07-08 MED ORDER — FENTANYL CITRATE (PF) 250 MCG/5ML IJ SOLN
INTRAMUSCULAR | Status: AC
  Filled 2021-07-08: qty 5

## 2021-07-08 MED ORDER — DEXTROMETHORPHAN POLISTIREX ER 30 MG/5ML PO SUER
15.0000 mg | Freq: Once | ORAL | Status: AC
  Administered 2021-07-08: 15 mg via ORAL
  Filled 2021-07-08: qty 5

## 2021-07-08 MED ORDER — LIDOCAINE HCL (PF) 2 % IJ SOLN
INTRAMUSCULAR | Status: AC
  Filled 2021-07-08: qty 5

## 2021-07-08 MED ORDER — MIDAZOLAM HCL 2 MG/2ML IJ SOLN
INTRAMUSCULAR | Status: DC | PRN
  Administered 2021-07-08: 2 mg via INTRAVENOUS

## 2021-07-08 MED ORDER — PROPOFOL 10 MG/ML IV BOLUS
INTRAVENOUS | Status: DC | PRN
  Administered 2021-07-08: 150 mg via INTRAVENOUS

## 2021-07-08 MED ORDER — ROCURONIUM BROMIDE 10 MG/ML (PF) SYRINGE
PREFILLED_SYRINGE | INTRAVENOUS | Status: AC
  Filled 2021-07-08: qty 10

## 2021-07-08 MED ORDER — ONDANSETRON HCL 4 MG/2ML IJ SOLN
INTRAMUSCULAR | Status: AC
  Filled 2021-07-08: qty 2

## 2021-07-08 MED ORDER — 0.9 % SODIUM CHLORIDE (POUR BTL) OPTIME
TOPICAL | Status: DC | PRN
  Administered 2021-07-08: 1000 mL

## 2021-07-08 MED ORDER — ROCURONIUM BROMIDE 10 MG/ML (PF) SYRINGE
PREFILLED_SYRINGE | INTRAVENOUS | Status: DC | PRN
  Administered 2021-07-08: 10 mg via INTRAVENOUS
  Administered 2021-07-08: 50 mg via INTRAVENOUS

## 2021-07-08 MED ORDER — FENTANYL CITRATE (PF) 100 MCG/2ML IJ SOLN: 25.0000 ug | INTRAMUSCULAR | Status: DC | PRN

## 2021-07-08 MED ORDER — DEXAMETHASONE SODIUM PHOSPHATE 10 MG/ML IJ SOLN
INTRAMUSCULAR | Status: DC | PRN
  Administered 2021-07-08: 8 mg via INTRAVENOUS

## 2021-07-08 MED ORDER — ONDANSETRON HCL 4 MG/2ML IJ SOLN
INTRAMUSCULAR | Status: DC | PRN
  Administered 2021-07-08: 4 mg via INTRAVENOUS

## 2021-07-08 MED ORDER — MIDAZOLAM HCL 2 MG/2ML IJ SOLN
INTRAMUSCULAR | Status: AC
  Filled 2021-07-08: qty 2

## 2021-07-08 MED ORDER — EPINEPHRINE PF 1 MG/ML IJ SOLN
INTRAMUSCULAR | Status: AC
  Filled 2021-07-08: qty 1

## 2021-07-08 MED ORDER — LACTATED RINGERS IV SOLN: INTRAVENOUS | Status: DC

## 2021-07-08 MED ORDER — PROPOFOL 10 MG/ML IV BOLUS
INTRAVENOUS | Status: AC
  Filled 2021-07-08: qty 20

## 2021-07-08 MED ORDER — PHENYLEPHRINE 40 MCG/ML (10ML) SYRINGE FOR IV PUSH (FOR BLOOD PRESSURE SUPPORT)
PREFILLED_SYRINGE | INTRAVENOUS | Status: DC | PRN
  Administered 2021-07-08: 120 ug via INTRAVENOUS
  Administered 2021-07-08: 80 ug via INTRAVENOUS

## 2021-07-08 MED ORDER — CHLORHEXIDINE GLUCONATE 0.12 % MT SOLN
15.0000 mL | Freq: Once | OROMUCOSAL | Status: AC
  Administered 2021-07-08: 15 mL via OROMUCOSAL

## 2021-07-08 MED ORDER — FENTANYL CITRATE (PF) 100 MCG/2ML IJ SOLN
INTRAMUSCULAR | Status: DC | PRN
  Administered 2021-07-08: 50 ug via INTRAVENOUS

## 2021-07-08 MED ORDER — SUGAMMADEX SODIUM 200 MG/2ML IV SOLN
INTRAVENOUS | Status: DC | PRN
  Administered 2021-07-08: 140 mg via INTRAVENOUS

## 2021-07-08 MED ORDER — PHENYLEPHRINE HCL-NACL 20-0.9 MG/250ML-% IV SOLN
INTRAVENOUS | Status: DC | PRN
  Administered 2021-07-08: 25 ug/min via INTRAVENOUS

## 2021-07-08 MED ORDER — CHLORHEXIDINE GLUCONATE 0.12 % MT SOLN
OROMUCOSAL | Status: AC
  Filled 2021-07-08: qty 15

## 2021-07-08 SURGICAL SUPPLY — 36 items
ADAPTER VALVE BIOPSY EBUS (MISCELLANEOUS) IMPLANT
ADPTR VALVE BIOPSY EBUS (MISCELLANEOUS)
APPLICATOR COTTON TIP 6 STRL (MISCELLANEOUS) IMPLANT
APPLICATOR COTTON TIP 6IN STRL (MISCELLANEOUS)
BLADE CLIPPER SURG (BLADE) IMPLANT
BRUSH CYTOL CELLEBRITY 1.5X140 (MISCELLANEOUS) IMPLANT
CANISTER SUCT 3000ML PPV (MISCELLANEOUS) ×2 IMPLANT
CNTNR URN SCR LID CUP LEK RST (MISCELLANEOUS) ×2 IMPLANT
CONT SPEC 4OZ STRL OR WHT (MISCELLANEOUS) ×4
COVER BACK TABLE 60X90IN (DRAPES) ×2 IMPLANT
FILTER STRAW FLUID ASPIR (MISCELLANEOUS) IMPLANT
FORCEPS BIOP RJ4 1.8 (CUTTING FORCEPS) IMPLANT
FORCEPS RADIAL JAW LRG 4 PULM (INSTRUMENTS) IMPLANT
GAUZE SPONGE 4X4 12PLY STRL (GAUZE/BANDAGES/DRESSINGS) IMPLANT
GLOVE SURG SIGNA 7.5 PF LTX (GLOVE) ×2 IMPLANT
GOWN STRL REUS W/ TWL LRG LVL3 (GOWN DISPOSABLE) ×1 IMPLANT
GOWN STRL REUS W/ TWL XL LVL3 (GOWN DISPOSABLE) ×1 IMPLANT
GOWN STRL REUS W/TWL LRG LVL3 (GOWN DISPOSABLE) ×2
GOWN STRL REUS W/TWL XL LVL3 (GOWN DISPOSABLE) ×2
KIT CLEAN ENDO COMPLIANCE (KITS) ×2 IMPLANT
KIT TURNOVER KIT B (KITS) ×2 IMPLANT
MARKER SKIN DUAL TIP RULER LAB (MISCELLANEOUS) ×2 IMPLANT
NS IRRIG 1000ML POUR BTL (IV SOLUTION) ×2 IMPLANT
OIL SILICONE PENTAX (PARTS (SERVICE/REPAIRS)) IMPLANT
PAD ARMBOARD 7.5X6 YLW CONV (MISCELLANEOUS) IMPLANT
RADIAL JAW LRG 4 PULMONARY (INSTRUMENTS)
SYR 20ML ECCENTRIC (SYRINGE) ×2 IMPLANT
SYR 5ML LL (SYRINGE) ×2 IMPLANT
SYR 5ML LUER SLIP (SYRINGE) ×2 IMPLANT
TOWEL GREEN STERILE (TOWEL DISPOSABLE) ×2 IMPLANT
TOWEL GREEN STERILE FF (TOWEL DISPOSABLE) ×2 IMPLANT
TRAP SPECIMEN MUCUS 40CC (MISCELLANEOUS) ×2 IMPLANT
TUBE CONNECTING 20X1/4 (TUBING) ×2 IMPLANT
VALVE BIOPSY  SINGLE USE (MISCELLANEOUS) ×1
VALVE BIOPSY SINGLE USE (MISCELLANEOUS) ×1 IMPLANT
VALVE SUCTION BRONCHIO DISP (MISCELLANEOUS) ×2 IMPLANT

## 2021-07-08 NOTE — Transfer of Care (Signed)
Immediate Anesthesia Transfer of Care Note  Patient: Stacie Kennedy  Procedure(s) Performed: VIDEO BRONCHOSCOPY ([place])  Patient Location: PACU  Anesthesia Type:General  Level of Consciousness: awake, alert  and oriented  Airway & Oxygen Therapy: Patient Spontanous Breathing  Post-op Assessment: Report given to RN  Post vital signs: Reviewed and stable  Last Vitals:  Vitals Value Taken Time  BP 113/60 07/08/21 0822  Temp    Pulse 71 07/08/21 0825  Resp 23 07/08/21 0825  SpO2 94 % 07/08/21 0825  Vitals shown include unvalidated device data.  Last Pain:  Vitals:   07/08/21 0634  TempSrc:   PainSc: 10-Worst pain ever      Patients Stated Pain Goal: 3 (97/28/20 6015)  Complications: No notable events documented.

## 2021-07-08 NOTE — Discharge Instructions (Addendum)
Do not drive or engage in heavy physical activity for 24 hours  You may resume normal activities tomorrow  You may use acetaminophen (Tylenol) if needed for discomfort  Call [phone] if you develop chest pain, shortness of breath or fever > 101F  Follow up with Dr. Rogue Bussing

## 2021-07-08 NOTE — Op Note (Signed)
NAME: LAYLEE, SCHOOLEY MEDICAL RECORD NO: [id number] ACCOUNT NO: [id number] DATE OF BIRTH: [birth year] FACILITY: [facility] LOCATION: [facility] PHYSICIAN: Revonda Standard. Roxan Hockey, MD  Operative Report   DATE OF PROCEDURE: 07/08/2021  PREOPERATIVE DIAGNOSIS:  Right middle lobe atelectasis, post right upper lobectomy.  POSTOPERATIVE DIAGNOSIS:  Right middle lobe atelectasis, post right upper lobectomy.  PROCEDURE:  Video bronchoscopy.  SURGEON:  Modesto Charon, MD  ASSISTANT:  None.  ANESTHESIA:  General.  FINDINGS:  Right upper lobe bronchus stump well healed.  No endobronchial lesions seen.  Right middle lobe orifice narrowed and slit-like, but no extrinsic compression or torsion and no endobronchial lesions.  CLINICAL NOTE:  Ms. 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  Her followup CT showed she had developed right middle lobe atelectasis.  There was no evidence of mass lesion, but she was  advised undergo bronchoscopy to rule out endobronchial mass.  The indications, risks, benefits, and alternatives were discussed in detail with the patient.  She understood and accepted the risks and agreed to proceed.  DESCRIPTION OF PROCEDURE:  The patient was brought to the operating room on 07/08/2021.  She had induction of general anesthesia and was intubated.  A timeout was performed.  Flexible fiberoptic bronchoscopy was performed via the endotracheal tube.   There were some thick clear secretions that were cleared with suctioning.  The distal trachea and carina were normal.  The left mainstem bronchus and left upper and lower lobe bronchi were normal to the level of the subsegmental bronchi with no  endobronchial lesions seen. On the right side, the right upper lobe stump was well healed.  The right mainstem and bronchus intermedius were within normal limits.  The lower lobe bronchi were widely patent.  The middle lobe bronchus was  narrowed in a  slit-like fashion.  The bronchoscope was passed through the orifice and no endobronchial lesions were seen.  There was normal mucosa.  A 2 mm scope then was inserted and directed into the right middle lobe bronchus and out to the second level of  subsegmental bronchi.  There were no endobronchial lesions seen.  Final inspection was again made with the larger bronchoscope.  There was no bleeding.  There were minimal secretions in the middle lobe.  The bronchoscope was removed.  The patient was  extubated in the operating room and taken to the postanesthetic care unit in good condition.   SHW D: 07/08/2021 8:31:12 am T: 07/08/2021 9:48:00 am  JOB: [id number]/ [id number]

## 2021-07-08 NOTE — Anesthesia Procedure Notes (Signed)
Procedure Name: Intubation Date/Time: 07/08/2021 7:35 AM Performed by: Barrington Ellison, CRNA Pre-anesthesia Checklist: Patient identified, Emergency Drugs available, Suction available and Patient being monitored Patient Re-evaluated:Patient Re-evaluated prior to induction Oxygen Delivery Method: Circle System Utilized Preoxygenation: Pre-oxygenation with 100% oxygen Induction Type: IV induction Ventilation: Mask ventilation without difficulty Laryngoscope Size: Mac and 3 Grade View: Grade I Tube type: Oral Tube size: 8.0 mm Number of attempts: 1 Airway Equipment and Method: Stylet and Oral airway Placement Confirmation: ETT inserted through vocal cords under direct vision, positive ETCO2 and breath sounds checked- equal and bilateral Secured at: 21 cm Tube secured with: Tape Dental Injury: Teeth and Oropharynx as per pre-operative assessment

## 2021-07-08 NOTE — Interval H&P Note (Signed)
History and Physical Interval Note:  No interval change  07/08/2021 7:11 AM  Stacie Kennedy  has presented today for surgery, with the diagnosis of RML ATELECTASIS.  The various methods of treatment have been discussed with the patient and family. After consideration of risks, benefits and other options for treatment, the patient has consented to  Procedure(s): VIDEO BRONCHOSCOPY (N/A) as a surgical intervention.  The patient's history has been reviewed, patient examined, no change in status, stable for surgery.  I have reviewed the patient's chart and labs.  Questions were answered to the patient's satisfaction.     Melrose Nakayama

## 2021-07-08 NOTE — H&P (Signed)
HPI: Stacie Kennedy returns for scheduled follow-up visit   Stacie Kennedy is a 60 year old woman with a history of tobacco abuse, COPD, lung cancer, hypertension, hyperlipidemia, and type 2 diabetes.  She has a greater than 40-pack-year history of smoking.  Unfortunately continues to smoke about a fourth of a pack of cigarettes daily.  She was found to have a nodule on a low-dose CT for lung cancer screening last year.  The nodule was hypermetabolic on PET.  I did a robotic right upper lobectomy on 09/19/2020.  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  She refused adjuvant therapy.  I last saw her in January 2022.  She was doing well at that time.  She was not taking any narcotics.  She says that she is having some issues with her breathing.  She has not been able to return to work because she cannot lift and move things like she was doing previously.  She had tried to quit smoking but has been able to.  She saw Dr. Rogue Bussing recently and had a CT of the chest.       Past Medical History:  Diagnosis Date   Cancer [facility])      lung cancer   Depression     Duodenitis     Family history of cancer     High cholesterol     Personal history of colonic polyps     Pre-diabetes     Umbilical hernia     UTI (urinary tract infection)              Current Outpatient Medications  Medication Sig Dispense Refill   metFORMIN (GLUCOPHAGE) 500 MG tablet TAKE ONE TABLET BY MOUTH EVERY MORNING WITH BREAKFAST 30 tablet 3   mirtazapine (REMERON) 15 MG tablet Take (1/2) tablet (7.5 mg total) by mouth at bedtime. 30 tablet 0   rosuvastatin (CRESTOR) 10 MG tablet TAKE ONE TABLET BY MOUTH EVERY DAY 30 tablet 3    No current facility-administered medications for this visit.      Physical Exam BP 123/82 (BP Location: Left Arm, Patient Position: Sitting)   Pulse 97   Resp 20   Ht 5\' 7"  (1.702 m)   Wt 149 lb (67.6 kg)   SpO2 91% Comment: RA   BMI 23.38 kg/m  60 year old woman in no acute distress Alert and oriented x3 with no focal deficits No cervical or supraclavicular adenopathy Lungs clear with essentially equal breath sounds bilaterally Cardiac regular rate and rhythm   Diagnostic Tests: CT CHEST WITH CONTRAST   TECHNIQUE: Multidetector CT imaging of the chest was performed during intravenous contrast administration.   CONTRAST:  52mL OMNIPAQUE IOHEXOL 350 MG/ML SOLN   COMPARISON:  CT chest dated August 13, 2020   FINDINGS: Cardiovascular: Normal heart size. Trace pericardial fluid. Three-vessel coronary artery calcifications. Aortic valve calcifications. Severe atherosclerotic disease of the thoracic aorta with areas of irregular soft plaque.   Mediastinum/Nodes: No pathologically enlarged lymph nodes seen in the chest. Full-thickness and thyroid are unremarkable.   Lungs/Pleura: Prior right upper lobectomy. Complete collapse of the right middle lobe. No obstructing hilar lesion is visualized. No new or enlarging pulmonary nodules.   Upper Abdomen: Unchanged left adrenal nodule measuring up to 2.7 cm. Partially visualized small fat containing ventral abdominal wall hernia.   Musculoskeletal: No chest wall abnormality. No acute or significant osseous findings.   IMPRESSION: Prior right upper lobectomy with no  evidence of recurrence or metastatic disease.   Complete collapse of the right middle lobe with no obstructing hilar lesion visualized. Recommend attention on follow-up.   Aortic Atherosclerosis (ICD10-I70.0) and Emphysema (ICD10-J43.9).     Electronically Signed   By: Yetta Glassman MD   On: 05/21/2021 14:49   I personally reviewed the CT images.  There is right middle lobe atelectasis.  No evidence of recurrent disease.   Impression: Stacie Kennedy is a 60 year old woman with a history of tobacco abuse, COPD, lung cancer, hypertension, hyperlipidemia, and type 2 diabetes.   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  No evidence of recurrent disease.  Tobacco abuse-continues to smoke.  Emphasized the importance of cessation.  She does not seem motivated to quit.   Right middle lobe atelectasis-suspect this is due to obstruction of the right middle lobe bronchus due to rotation of the lower and middle lobes post upper lobectomy.  I recommended we do bronchoscopy to further evaluate and make sure there is no obstructing lesion.  I described the procedure of bronchoscopy to her.  We would plan to do this on an outpatient basis.  The plan would be to do it either with intravenous sedation or general anesthesia per her preference.  She understands we may need to do biopsies.  We discussed the indications, risk, benefits, and alternatives.  She understands this is a relatively low risk procedure but there is always a risk with any medical intervention.  And if she chooses to have general anesthesia the risks associated with that.  General risk of any procedure include MI, DVT, PE, bleeding, and infection.   Plan: Bronchoscopy on 06/25/2021   Melrose Nakayama, MD [facility] [phone]                Electronically signed by Melrose Nakayama, MD at 06/01/2021  4:33 PM

## 2021-07-08 NOTE — Brief Op Note (Signed)
07/08/2021  8:10 AM  PATIENT:  Stacie Kennedy  60 y.o. female  PRE-OPERATIVE DIAGNOSIS:  RML ATELECTASIS  POST-OPERATIVE DIAGNOSIS:  RML ATELECTASIS  PROCEDURE:  Procedure(s): VIDEO BRONCHOSCOPY (N/A)  SURGEON:  Surgeon(s) and Role:    * Melrose Nakayama, MD - Primary  PHYSICIAN ASSISTANT:   ASSISTANTS: none   ANESTHESIA:   general  EBL:  none  BLOOD ADMINISTERED:none  DRAINS: none   LOCAL MEDICATIONS USED:  NONE  SPECIMEN:  No Specimen  DISPOSITION OF SPECIMEN:  N/A  COUNTS:  YES  TOURNIQUET:  * No tourniquets in log *  DICTATION: .Other Dictation: Dictation Number -  PLAN OF CARE: Discharge to home after PACU  PATIENT DISPOSITION:  PACU - hemodynamically stable.   Delay start of Pharmacological VTE agent (>24hrs) due to surgical blood loss or risk of bleeding: not applicable  FINDINGS: RUL stump well healed. RML orifice narrowed, no endobronchial lesion or extrinsic compression, anatomy beyond orifice WNL

## 2021-07-08 NOTE — Progress Notes (Signed)
This encounter was created in error - please disregard.

## 2021-07-09 ENCOUNTER — Ambulatory Visit: Payer: Self-pay

## 2021-07-09 ENCOUNTER — Ambulatory Visit: Payer: Self-pay | Admitting: Gerontology

## 2021-07-09 ENCOUNTER — Encounter (HOSPITAL_COMMUNITY): Payer: Self-pay | Admitting: Thoracic Surgery (Cardiothoracic Vascular Surgery)

## 2021-07-09 NOTE — Anesthesia Postprocedure Evaluation (Signed)
Anesthesia Post Note  Patient: Stacie Kennedy  Procedure(s) Performed: VIDEO BRONCHOSCOPY ([place])     Patient location during evaluation: PACU Anesthesia Type: General Level of consciousness: sedated and patient cooperative Pain management: pain level controlled Vital Signs Assessment: post-procedure vital signs reviewed and stable Respiratory status: spontaneous breathing Cardiovascular status: stable Anesthetic complications: no   No notable events documented.  Last Vitals:  Vitals:   07/08/21 0905 07/08/21 0925  BP: (!) 127/98 119/74  Pulse: 65 67  Resp: 14 14  Temp:    SpO2: 95% 95%    Last Pain:  Vitals:   07/08/21 0855  TempSrc:   PainSc: 0-No pain                 Nolon Nations

## 2021-07-09 NOTE — Progress Notes (Unsigned)
Patient is in transition right now, having quit working in March due to her lung cancer. She recently received a letter for her disability application that has requested she see one of their providers to get an evaluation to move her disability application forward. The patient is living with a friend, and relies on a giftcard she gets from the [place] to get food. She is interested in EBT and vouchers for food and clothes. The patient also expressed interest in help with applying for Medicaid and for financial aid for her medical bills. She says she's drowning in medical bills from her lung cancer.  Next Steps are making clothing and toiletry referrals and looking into resources that can support her while she waits on her disability to go through. -BG

## 2021-07-09 NOTE — Telephone Encounter (Signed)
Patient needs to be rescheduled to next week. UPDATE: Patient was rescheduled for 9/27 at 6pm and confirmed via telephone.

## 2021-07-14 ENCOUNTER — Telehealth: Payer: Self-pay | Admitting: Licensed Clinical Social Worker

## 2021-07-14 ENCOUNTER — Other Ambulatory Visit: Payer: Self-pay

## 2021-07-14 ENCOUNTER — Encounter: Payer: Self-pay | Admitting: Gerontology

## 2021-07-14 ENCOUNTER — Ambulatory Visit: Payer: Self-pay | Admitting: Gerontology

## 2021-07-14 VITALS — BP 128/81 | HR 76 | Temp 97.1°F | Resp 16 | Ht 67.5 in | Wt 149.2 lb

## 2021-07-14 DIAGNOSIS — M85642 Other cyst of bone, left hand: Secondary | ICD-10-CM | POA: Insufficient documentation

## 2021-07-14 DIAGNOSIS — M25511 Pain in right shoulder: Secondary | ICD-10-CM

## 2021-07-14 DIAGNOSIS — R7303 Prediabetes: Secondary | ICD-10-CM

## 2021-07-14 DIAGNOSIS — E785 Hyperlipidemia, unspecified: Secondary | ICD-10-CM

## 2021-07-14 DIAGNOSIS — R0602 Shortness of breath: Secondary | ICD-10-CM | POA: Insufficient documentation

## 2021-07-14 MED ORDER — MELOXICAM 7.5 MG PO TABS
7.5000 mg | ORAL_TABLET | Freq: Every day | ORAL | 0 refills | Status: DC
Start: 2021-07-14 — End: 2021-09-10
  Filled 2021-07-14: qty 14, 14d supply, fill #0

## 2021-07-14 MED ORDER — METFORMIN HCL 500 MG PO TABS
ORAL_TABLET | ORAL | 3 refills | Status: DC
Start: 2021-07-14 — End: 2022-01-18
  Filled 2021-07-14: qty 30, fill #0

## 2021-07-14 MED ORDER — ROSUVASTATIN CALCIUM 10 MG PO TABS
ORAL_TABLET | Freq: Every day | ORAL | 3 refills | Status: DC
  Filled 2021-07-14: qty 30, fill #0

## 2021-07-14 MED ORDER — ALBUTEROL SULFATE HFA 108 (90 BASE) MCG/ACT IN AERS
2.0000 | INHALATION_SPRAY | Freq: Four times a day (QID) | RESPIRATORY_TRACT | 2 refills | Status: DC | PRN
  Filled 2021-07-14 – 2021-09-15 (×2): qty 6.7, 25d supply, fill #0

## 2021-07-14 MED ORDER — PREDNISONE 10 MG PO TABS
10.0000 mg | ORAL_TABLET | Freq: Every day | ORAL | 0 refills | Status: DC
  Filled 2021-07-14: qty 6, 6d supply, fill #0

## 2021-07-14 NOTE — Progress Notes (Signed)
Established Patient Office Visit  Subjective:  Patient ID: Stacie Kennedy, female    DOB: 10/16/1960  Age: 60 y.o. MRN: [id number]  CC:  Chief Complaint  Patient presents with   Follow-up   Shoulder Pain    Patient in today c/o right shoulder pain and unable to raise her right arm x 1 month. No injury noted.   Hand Pain    Patient c/o left thumb pain and a "knot" on her left thumb x 6 months. No injury noted.    HPI Stacie Kennedy is a 60 y/o female who has history of lung cancer, depression, dyspnea, hyperlipidemia, prediabetes, urinary tract infection, presents for complaint of right shoulder pain and left thumb pain. She c/o non traumatic constant pain to her right shoulder that started one month ago. She describes pain as stiffness, unable to lift her right arm. She states that pain radiates to her right upper arm. She reports taking  1 tablet of Aleve with no relief.  She denies erythema, edema and muscle weakness. She  also c/o  having a cyst to her left thumb that keeps her finger extended and painful sometimes.  She states that she noticed the cyst 6 months ago, but the size has not increased.  She had video bronchoscopy done on 07/08/2021 by Dr. Sloan Leiter, for right middle lobe atelectasis. Chest X ray was done after procedure and it showed Stable radiographic appearance the chest. The appearance of the right lung is compatible with prior right upper lobectomy and chronic collapse of the right middle lobe (better demonstrated on prior chest CT 05/20/2021). No acute findings. 2. Emphysema. She c/o intermittent shortness of breath with exertion with occasional wheezing. Overall, she's concerned about her right shoulder pain and offers no further complaint.      Past Medical History:  Diagnosis Date   Cancer [facility])    lung cancer   Depression    Duodenitis    Dyspnea    Family history of cancer    High cholesterol    Personal history of colonic polyps    Pre-diabetes     Umbilical hernia    UTI (urinary tract infection)     Past Surgical History:  Procedure Laterality Date   COLONOSCOPY WITH PROPOFOL N/A 03/24/2021   Procedure: COLONOSCOPY WITH PROPOFOL;  Surgeon: Jonathon Bellows, MD;  Location: [facility] ENDOSCOPY;  Service: Gastroenterology;  Laterality: N/A;   INTERCOSTAL NERVE BLOCK  09/19/2020   Procedure: INTERCOSTAL NERVE BLOCK;  Surgeon: Melrose Nakayama, MD;  Location: [facility] OR;  Service: Thoracic;;   LUNG LOBECTOMY Right    LUNG REMOVAL, PARTIAL Right 09/19/2020   NODE DISSECTION Right 09/19/2020   Procedure: NODE DISSECTION;  Surgeon: Melrose Nakayama, MD;  Location: [facility];  Service: Thoracic;  Laterality: Right;   VIDEO BRONCHOSCOPY N/A 07/08/2021   Procedure: VIDEO BRONCHOSCOPY;  Surgeon: Melrose Nakayama, MD;  Location: [facility] OR;  Service: Thoracic;  Laterality: N/A;    Family History  Problem Relation Age of Onset   Diabetes Mother    Cancer Mother    Diabetes Father    Stroke Father    Heart attack Father        76s   Healthy Sister    Cancer Brother    Hepatitis Brother    Diabetes Brother    Hypertension Brother    Heart disease Brother     Social History   Socioeconomic History   Marital status: Single    Spouse name: Not on  file   Number of children: Not on file   Years of education: Not on file   Highest education level: Not on file  Occupational History   Not on file  Tobacco Use   Smoking status: Some Days    Packs/day: 0.25    Years: 43.00    Pack years: 10.75    Types: Cigarettes   Smokeless tobacco: Never   Tobacco comments:    states she is slowing, trying to quit  Vaping Use   Vaping Use: Never used  Substance and Sexual Activity   Alcohol use: Yes    Alcohol/week: 2.0 standard drinks    Types: 2 Cans of beer per week    Comment: occasional   Drug use: No   Sexual activity: Not on file  Other Topics Concern   Not on file  Social History Narrative   Lives in [place]; smokes; now and then  beer; with boy friend. 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    Social Determinants of Health   Financial Resource Strain: Not on file  Food Insecurity: Food Insecurity Present   Worried About [place] in the Last Year: Sometimes true   Ran Out of Food in the Last Year: Sometimes true  Transportation Needs: No Transportation Needs   Lack of Transportation (Medical): No   Lack of Transportation (Non-Medical): No  Physical Activity: Insufficiently Active   Days of Exercise per Week: 7 days   Minutes of Exercise per Session: 20 min  Stress: Stress Concern Present   Feeling of Stress : To some extent  Social Connections: Socially Isolated   Frequency of Communication with Friends and Family: Never   Frequency of Social Gatherings with Friends and Family: Never   Attends Religious Services: Never   Marine scientist or Organizations: No   Attends Music therapist: Never   Marital Status: Never married  Human resources officer Violence: Not on file    Outpatient Medications Prior to Visit  Medication Sig Dispense Refill   metFORMIN (GLUCOPHAGE) 500 MG tablet TAKE ONE TABLET BY MOUTH EVERY MORNING WITH BREAKFAST 30 tablet 3   rosuvastatin (CRESTOR) 10 MG tablet TAKE ONE TABLET BY MOUTH EVERY DAY 30 tablet 3   buPROPion (WELLBUTRIN SR) 150 MG 12 hr tablet Take 1 tablet (150 mg total) by mouth 2 (two) times daily. (Patient not taking: Reported on 07/14/2021) 60 tablet 0   No facility-administered medications prior to visit.    No Known Allergies  ROS Review of Systems  Constitutional: Negative.   Respiratory:  Positive for shortness of breath (.Sometimes) and wheezing (.sometimes).   Cardiovascular: Negative.   Musculoskeletal:  Positive for arthralgias (right shoulder pain, left thumb cyst).  Neurological: Negative.   Psychiatric/Behavioral:  Positive for dysphoric mood (Teary during visit).      Objective:    Physical Exam HENT:     Head:  Normocephalic and atraumatic.  Cardiovascular:     Rate and Rhythm: Normal rate and regular rhythm.     Pulses: Normal pulses.     Heart sounds: Normal heart sounds.  Pulmonary:     Effort: Pulmonary effort is normal.     Breath sounds: Normal breath sounds. Decreased breath sounds: right lower lobectomy.  Musculoskeletal:     Right shoulder: Tenderness present. No swelling, effusion, laceration or crepitus. Decreased range of motion.     Comments: Unable to abduct nor adduct her right shoulder  Skin:      Neurological:  General: No focal deficit present.     Mental Status: She is alert and oriented to person, place, and time. Mental status is at baseline.  Psychiatric:        Mood and Affect: Mood normal.        Behavior: Behavior normal.        Thought Content: Thought content normal.        Judgment: Judgment normal.    BP 128/81 (BP Location: Left Arm, Patient Position: Sitting, Cuff Size: Normal)   Pulse 76   Temp (!) 97.1 F (36.2 C)   Resp 16   Ht 5' 7.5" (1.715 m)   Wt 149 lb 3.2 oz (67.7 kg)   SpO2 97%   BMI 23.02 kg/m  Wt Readings from Last 3 Encounters:  07/14/21 149 lb 3.2 oz (67.7 kg)  07/08/21 149 lb (67.6 kg)  06/01/21 149 lb (67.6 kg)     Health Maintenance Due  Topic Date Due   HIV Screening  Never done   Hepatitis C Screening  Never done   TETANUS/TDAP  Never done   Zoster Vaccines- Shingrix (1 of 2) Never done   PAP SMEAR-Modifier  Never done   MAMMOGRAM  Never done   COVID-19 Vaccine (3 - Moderna risk series) 05/06/2020   INFLUENZA VACCINE  Never done    There are no preventive care reminders to display for this patient.  No results found for: TSH Lab Results  Component Value Date   WBC 6.4 05/22/2021   HGB 15.7 (H) 05/22/2021   HCT 46.9 (H) 05/22/2021   MCV 86.9 05/22/2021   PLT 409 (H) 05/22/2021   Lab Results  Component Value Date   NA 139 05/22/2021   K 4.1 05/22/2021   CO2 26 05/22/2021   GLUCOSE 100 (H) 05/22/2021    BUN 14 05/22/2021   CREATININE 0.72 05/22/2021   BILITOT 0.4 05/22/2021   ALKPHOS 91 05/22/2021   AST 13 (L) 05/22/2021   ALT 9 05/22/2021   PROT 8.0 05/22/2021   ALBUMIN 3.7 05/22/2021   CALCIUM 9.8 05/22/2021   ANIONGAP 8 05/22/2021   Lab Results  Component Value Date   CHOL 157 11/26/2020   Lab Results  Component Value Date   HDL 44 11/26/2020   Lab Results  Component Value Date   LDLCALC 94 11/26/2020   Lab Results  Component Value Date   TRIG 105 11/26/2020   Lab Results  Component Value Date   CHOLHDL 3.6 11/26/2020   Lab Results  Component Value Date   HGBA1C 6.2 (H) 11/26/2020      Assessment & Plan:   1. Prediabetes -She will continue current medication, encouraged to continue on low carb/non concentrated sweet diet. - metFORMIN (GLUCOPHAGE) 500 MG tablet; TAKE ONE TABLET BY MOUTH EVERY MORNING WITH BREAKFAST  Dispense: 30 tablet; Refill: 3  2. Elevated lipids - She's tolerating medication, will continue on current medication, low fat/cholesterol diet. - rosuvastatin (CRESTOR) 10 MG tablet; TAKE ONE TABLET BY MOUTH EVERY DAY  Dispense: 30 tablet; Refill: 3  3. Acute pain of right shoulder - Non traumatic, She will start Prednisone 10 mg daily and Meloxicam 7.5 mg daily after Prednisone. She was educated on medication side effects and advised to notify clinic. She will follow up with Dr Jefm Bryant or Dr Esmeralda Links ay [facility]. - predniSONE (DELTASONE) 10 MG tablet; Take 1 tablet (10 mg total) by mouth daily with breakfast.  Dispense: 6 tablet; Refill: 0 - meloxicam (MOBIC) 7.5 MG tablet;  Take 1 tablet (7.5 mg total) by mouth daily. Start after stopping prednisone  Dispense: 14 tablet; Refill: 0  4. Cyst of bone of left hand - She will start Meloxicam 7.5 mg daily after Prednisone, and will follow up with Dr Jefm Bryant or Dr Esmeralda Links ay [facility]. - meloxicam (MOBIC) 7.5 MG tablet; Take 1 tablet (7.5 mg total) by mouth daily. Start after stopping prednisone  Dispense: 14  tablet; Refill: 0  5. Shortness of breath - She will start on Albuterol as needed and was advised to notify clinic or go to the ED for worsening symptoms. - albuterol (VENTOLIN HFA) 108 (90 Base) MCG/ACT inhaler; Inhale 2 puffs into the lungs every 6 (six) hours as needed for wheezing or shortness of breath.  Dispense: 18 g; Refill: 2     Follow-up: Return in about 3 weeks (around 08/04/2021), or if symptoms worsen or fail to improve.    Keegen Heffern Jerold Coombe, NP

## 2021-07-14 NOTE — Telephone Encounter (Signed)
Voicemail not set up. Will try again later to confirm Thursday's phone visit with HP at 4p.

## 2021-07-15 ENCOUNTER — Telehealth: Payer: Self-pay | Admitting: Pharmacist

## 2021-07-15 ENCOUNTER — Other Ambulatory Visit: Payer: Self-pay

## 2021-07-15 NOTE — Telephone Encounter (Signed)
--   Stacie Kennedy - Wednesday, July 15, 2021 11:10 AM --Received pharmacy printout for ProAir HFA Inhale 2 puffs into the lungs every 6 hours as needed for wheezing or shortness of breath (for Ventolin HFA). Printed [employer] application-mailing patient her portion to sign and date, also putting provider portion in [facility] folder.

## 2021-07-16 ENCOUNTER — Other Ambulatory Visit: Payer: Self-pay

## 2021-07-16 ENCOUNTER — Ambulatory Visit: Payer: Self-pay | Admitting: Licensed Clinical Social Worker

## 2021-07-16 DIAGNOSIS — F411 Generalized anxiety disorder: Secondary | ICD-10-CM

## 2021-07-16 DIAGNOSIS — F331 Major depressive disorder, recurrent, moderate: Secondary | ICD-10-CM

## 2021-07-16 NOTE — Telephone Encounter (Signed)
Was able to get patient on the phone this morning at 10:35am to confirm today's phone visit with HP. -BG

## 2021-07-16 NOTE — BH Specialist Note (Signed)
Integrated Behavioral Health Follow Up  Telephone Visit  MRN: [id number] Name: Stacie Kennedy   Total time: 60 minutes  Types of Service: Telephone visitPatient consents to telephone visit and 2 patient identifiers were used to identify patient   Interpretor:No. Interpretor Name and Language: N/A   Subjective: Stacie Kennedy is a 60 y.o. female accompanied by  herself  Patient was referred by Carlyon Shadow, NP  for Years . Patient reports the following symptoms/concerns: The patient stated that she is doing the same since her last follow-up appointment. She reported that she started taking Wellbutrin yesterday. She shared that she has some problems with a joint on her finger and was prescribed meloxicam and was concerned it would react with Wellbutrin. She noted that sh had a  bronchoscopy procedure and that it went well. She shared that she is unable to work and has no means to support herself. She asked for assistance finding resources that may help her. She stated that she does not have any friends or family that can assist her. She noted that she continues to struggle frequent crying spells, and poor appetite daily. She denied any suicidal or homicidal thoughts.   Duration of problem: Years ; Severity of problem: moderate  Objective: Mood: Euthymic and Affect: Appropriate Risk of harm to self or others: No plan to harm self or others  Life Context: Family and Social: see above School/Work: see above Self-Care: see above Life Changes: see above  Patient and/or Family's Strengths/Protective Factors: Concrete supports in place (healthy food, safe environments, etc.)  Goals Addressed: Patient will:  Reduce symptoms of: agitation, anxiety, depression, and stress   Increase knowledge and/or ability of: coping skills, healthy habits, self-management skills, and stress reduction   Demonstrate ability to: Increase healthy adjustment to current life circumstances and Increase adequate  support systems for patient/family  Progress towards Goals: Ongoing  Interventions: Interventions utilized:  CBT Cognitive Behavioral Therapy was utilized by the clinician during today's follow up session. The clinician processed with the patient how they have been doing since the last follow-up session. The clinician provided a space for the patient to ventilate their frustrations regarding their current life circumstances.Clinician measured the patient's anxiety and depression on a numerical scale. Clinician utilized guidance and supervision, provided by psychiatric consultant, to inform patient of the benefits and risks of psychotropic medication use including a verbal summary of Black Box warnings. Patient was given the opportunity to ask questions and address concerns regarding the psychiatric consultants recommendations for treatment. Clinician encouraged the client to reach out to her pharmacist or primary care provider regarding her medication concerns; clinician offered to message the patient's primary care provider and relay her concerns.   Standardized Assessments completed: GAD-7 and PHQ 9 GAD-7 =  15 PHQ-9 =  13  Assessment: Patient currently experiencing see above.   Patient may benefit from see above.  Plan: Follow up with behavioral health clinician on : 07/22/2021 at 11:00 AM  Behavioral recommendations:  Referral(s): [place] (In Clinic) "From scale of 1-10, how likely are you to follow plan?":   Lesli Albee, LCSWA

## 2021-07-21 ENCOUNTER — Telehealth: Payer: Self-pay | Admitting: Pharmacist

## 2021-07-21 NOTE — Telephone Encounter (Signed)
--   Stacie Kennedy - Tuesday, July 21, 2021 12:53 PM --I have received the sign provider portion of [employer] application for ProAir--holding for patient to return her portion, just mailed to patient 07/15/2021.

## 2021-07-22 ENCOUNTER — Ambulatory Visit: Payer: Self-pay | Admitting: Licensed Clinical Social Worker

## 2021-07-23 ENCOUNTER — Ambulatory Visit: Payer: Self-pay | Admitting: Family Medicine

## 2021-07-23 ENCOUNTER — Ambulatory Visit: Payer: Self-pay | Admitting: Licensed Clinical Social Worker

## 2021-07-23 ENCOUNTER — Encounter: Payer: Self-pay | Admitting: Gerontology

## 2021-07-23 ENCOUNTER — Other Ambulatory Visit: Payer: Self-pay

## 2021-07-23 VITALS — BP 118/79 | HR 80 | Temp 97.5°F | Resp 16 | Wt 151.5 lb

## 2021-07-23 DIAGNOSIS — M65312 Trigger thumb, left thumb: Secondary | ICD-10-CM

## 2021-07-23 DIAGNOSIS — M25511 Pain in right shoulder: Secondary | ICD-10-CM

## 2021-07-23 NOTE — Patient Instructions (Signed)
1. Acute pain of right shoulder Given shoulder injection today and referral sent to begin PT.  If this does not result in some improvement please let us know and we can talk more about getting imaging.  At this point it seems like a partial tear of your rotator cuff so lets try conservative therapy.   The steroid injection should begin to kick in in the next several days so lets hope that give your some relief.  - Injection tendon or ligament; Future

## 2021-07-23 NOTE — Progress Notes (Signed)
Established Patient Office Visit  Subjective:  Patient ID: Stacie Kennedy, female    DOB: [birth year]  Age: 60 y.o. MRN: [id number]  CC: right shoulder pain    HPI Stacie Kennedy presents for right shoulder pain.  Pt seen in the office on 10/4 and reported pain in the right shoulder. Pt coming back in today to have right shoulder injection.  At that time pt was also noted to have a cyst on the right thumb that based on history sounds like a ganglion cyst.  Can discuss this further with pt and see if intervention is warranted.   Since last visit pt completed her course of prednisone with no relief and has also been taking mobic with no relief.   Pt reports pain started about a month ago with a dull pain.  Pt was trying to workout and help the pain in shoulder and then hear a pop and acute pain in the shoulder and pain with using the right arm. Pain is located in the posterior right shoulder.  This is keep pt from being able to preform her tasks at work.   Trigger finger of the left thumb occasionally giving pt a problems but not today.  Discussed that we could consider an injection in the future if needed.   Past Medical History:  Diagnosis Date   Cancer [facility])    lung cancer   Depression    Duodenitis    Dyspnea    Family history of cancer    High cholesterol    Personal history of colonic polyps    Pre-diabetes    Umbilical hernia    UTI (urinary tract infection)     Past Surgical History:  Procedure Laterality Date   COLONOSCOPY WITH PROPOFOL N/A 03/24/2021   Procedure: COLONOSCOPY WITH PROPOFOL;  Surgeon: Jonathon Bellows, MD;  Location: [facility] ENDOSCOPY;  Service: Gastroenterology;  Laterality: N/A;   INTERCOSTAL NERVE BLOCK  09/19/2020   Procedure: INTERCOSTAL NERVE BLOCK;  Surgeon: Melrose Nakayama, MD;  Location: [place];  Service: Thoracic;;   LUNG LOBECTOMY Right    LUNG REMOVAL, PARTIAL Right 09/19/2020   NODE DISSECTION Right 09/19/2020   Procedure: NODE DISSECTION;   Surgeon: Melrose Nakayama, MD;  Location: [place];  Service: Thoracic;  Laterality: Right;   VIDEO BRONCHOSCOPY N/A 07/08/2021   Procedure: VIDEO BRONCHOSCOPY;  Surgeon: Melrose Nakayama, MD;  Location: [facility] OR;  Service: Thoracic;  Laterality: N/A;    Family History  Problem Relation Age of Onset   Diabetes Mother    Cancer Mother    Diabetes Father    Stroke Father    Heart attack Father        55s   Healthy Sister    Cancer Brother    Hepatitis Brother    Diabetes Brother    Hypertension Brother    Heart disease Brother     Social History   Socioeconomic History   Marital status: Single    Spouse name: Not on file   Number of children: Not on file   Years of education: Not on file   Highest education level: Not on file  Occupational History   Not on file  Tobacco Use   Smoking status: Some Days    Packs/day: 0.25    Years: 43.00    Pack years: 10.75    Types: Cigarettes   Smokeless tobacco: Never   Tobacco comments:    states she is slowing, trying to quit  Vaping Use  Vaping Use: Never used  Substance and Sexual Activity   Alcohol use: Yes    Alcohol/week: 2.0 standard drinks    Types: 2 Cans of beer per week    Comment: occasional   Drug use: No   Sexual activity: Not on file  Other Topics Concern   Not on file  Social History Narrative   Lives in [place]; smokes; now and then beer; with boy friend. 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    Social Determinants of Health   Financial Resource Strain: Not on file  Food Insecurity: Food Insecurity Present   Worried About [place] in the Last Year: Sometimes true   Ran Out of Food in the Last Year: Sometimes true  Transportation Needs: No Transportation Needs   Lack of Transportation (Medical): No   Lack of Transportation (Non-Medical): No  Physical Activity: Insufficiently Active   Days of Exercise per Week: 7 days   Minutes of Exercise per Session: 20 min  Stress:  Stress Concern Present   Feeling of Stress : To some extent  Social Connections: Socially Isolated   Frequency of Communication with Friends and Family: Never   Frequency of Social Gatherings with Friends and Family: Never   Attends Religious Services: Never   Marine scientist or Organizations: No   Attends Music therapist: Never   Marital Status: Never married  Human resources officer Violence: Not on file    Outpatient Medications Prior to Visit  Medication Sig Dispense Refill   albuterol (VENTOLIN HFA) 108 (90 Base) MCG/ACT inhaler Inhale 2 puffs into the lungs every 6 (six) hours as needed for wheezing or shortness of breath. 6.7 g 2   buPROPion (WELLBUTRIN SR) 150 MG 12 hr tablet Take 1 tablet (150 mg total) by mouth 2 (two) times daily. 60 tablet 0   meloxicam (MOBIC) 7.5 MG tablet Take 1 tablet (7.5 mg total) by mouth daily. Start after stopping prednisone 14 tablet 0   metFORMIN (GLUCOPHAGE) 500 MG tablet TAKE ONE TABLET BY MOUTH EVERY MORNING WITH BREAKFAST 30 tablet 3   rosuvastatin (CRESTOR) 10 MG tablet TAKE ONE TABLET BY MOUTH EVERY DAY 30 tablet 3   predniSONE (DELTASONE) 10 MG tablet Take 1 tablet (10 mg total) by mouth daily with breakfast. (Patient not taking: Reported on 07/23/2021) 6 tablet 0   No facility-administered medications prior to visit.    No Known Allergies  ROS Review of Systems    Objective:    Physical Exam Constitutional:      Appearance: Normal appearance.  HENT:     Head: Normocephalic and atraumatic.  Eyes:     Extraocular Movements: Extraocular movements intact.     Conjunctiva/sclera: Conjunctivae normal.  Musculoskeletal:        General: Tenderness (palpation of the posterior right shoulder.) present. No swelling or deformity.     Cervical back: Normal range of motion.     Comments: Pain and slight weakness with adduction of the right shoulder, pain on lateral raise but able to complete close to full ROM.  Very hesitant  to use the shoulder/arm.   Skin:    General: Skin is warm and dry.  Neurological:     Mental Status: She is alert.    BP 118/79 (BP Location: Left Arm, Patient Position: Sitting, Cuff Size: Large)   Pulse 80   Temp (!) 97.5 F (36.4 C)   Resp 16   Wt 151 lb 8 oz (68.7 kg)   BMI  23.38 kg/m  Wt Readings from Last 3 Encounters:  07/23/21 151 lb 8 oz (68.7 kg)  07/14/21 149 lb 3.2 oz (67.7 kg)  07/08/21 149 lb (67.6 kg)     Health Maintenance Due  Topic Date Due   HIV Screening  Never done   Hepatitis C Screening  Never done   TETANUS/TDAP  Never done   Zoster Vaccines- Shingrix (1 of 2) Never done   PAP SMEAR-Modifier  Never done   MAMMOGRAM  Never done   COVID-19 Vaccine (3 - Moderna risk series) 05/06/2020   INFLUENZA VACCINE  Never done    There are no preventive care reminders to display for this patient.  No results found for: TSH Lab Results  Component Value Date   WBC 6.4 05/22/2021   HGB 15.7 (H) 05/22/2021   HCT 46.9 (H) 05/22/2021   MCV 86.9 05/22/2021   PLT 409 (H) 05/22/2021   Lab Results  Component Value Date   NA 139 05/22/2021   K 4.1 05/22/2021   CO2 26 05/22/2021   GLUCOSE 100 (H) 05/22/2021   BUN 14 05/22/2021   CREATININE 0.72 05/22/2021   BILITOT 0.4 05/22/2021   ALKPHOS 91 05/22/2021   AST 13 (L) 05/22/2021   ALT 9 05/22/2021   PROT 8.0 05/22/2021   ALBUMIN 3.7 05/22/2021   CALCIUM 9.8 05/22/2021   ANIONGAP 8 05/22/2021   Lab Results  Component Value Date   CHOL 157 11/26/2020   Lab Results  Component Value Date   HDL 44 11/26/2020   Lab Results  Component Value Date   LDLCALC 94 11/26/2020   Lab Results  Component Value Date   TRIG 105 11/26/2020   Lab Results  Component Value Date   CHOLHDL 3.6 11/26/2020   Lab Results  Component Value Date   HGBA1C 6.2 (H) 11/26/2020      Assessment & Plan:   Problem List Items Addressed This Visit       Other   Acute pain of right shoulder - Primary   Relevant Orders    Injection tendon or ligament   1. Acute pain of right shoulder Given shoulder injection today and referral sent to begin PT.  If this does not result in some improvement please let us know and we can talk more about getting imaging.  At this point it seems like a partial tear of your rotator cuff so lets try conservative therapy.   The steroid injection should begin to kick in in the next several days so lets hope that give your some relief.  - Injection tendon or ligament; Future  2. Trigger finger of left thumb Small nodule noted on the proximal palmar aspect of the left hand.  Able to have full rom of he thumb at this time. Can consider injection in the future if needed.    Follow-up: Return in about 4 weeks (around 08/20/2021) for fu on right shoulder pain to see if further imaging is needed. Marland Kitchen    Trygg Mantz, MDShoulder Injection Procedure Note  Pre-operative Diagnosis: right shoulder  Post-operative Diagnosis: same  Indications: Likely rotator cuff partial tear with acute pain   Anesthesia: Lidocaine 1% without epinephrine     Procedure Details   Verbal consent was obtained for the procedure. The shoulder was prepped with iodine. Using a 21 gauge needle the glenohumeral joint is injected with 4 mL 1% lidocaine and 1 mL of  depomedrol 40mg /ml  under the posterior aspect of the acromion. The injection site  was cleansed with topical isopropyl alcohol and a dressing was applied.  Complications:  None; patient tolerated the procedure well.

## 2021-07-28 ENCOUNTER — Ambulatory Visit: Payer: Self-pay | Admitting: Licensed Clinical Social Worker

## 2021-07-28 ENCOUNTER — Other Ambulatory Visit: Payer: Self-pay

## 2021-07-28 DIAGNOSIS — F411 Generalized anxiety disorder: Secondary | ICD-10-CM

## 2021-07-28 DIAGNOSIS — F331 Major depressive disorder, recurrent, moderate: Secondary | ICD-10-CM

## 2021-07-28 NOTE — BH Specialist Note (Signed)
Integrated Behavioral Health Follow Up Telephone Visit  MRN: [id number] Name: Stacie Kennedy   Total time: 30 minutes  Types of Service: Telephone visit Patient consents to telephone visit and 2 patient identifiers were used to identify patient   Interpretor:No. Interpretor Name and Language: N/A  Subjective: Stacie Kennedy is a 60 y.o. female accompanied by  herself Patient was referred by Stacie Shadow, NP for Mental Health. Patient reports the following symptoms/concerns: The patient reports that she has been doing okay since her last follow-up appointment. Stacie Kennedy Shared that she is experiencing right arm pain that began a month ago. She shared that she saw her primary care provider about her arm pain and was told it was a rotator cuff injury. She shared that the medication she was given did not help. She stated that her pain level is at a ten the worst pain she has ever experienced in her life. She noted that she has not tried to ease the pain with heat or cold packs, and stated she is not able to lay down and rest. She noted that when she lays down the pain increases.Stacie Kennedy noted that she may go to the emergency department tonight if her pain does not ease up. She reported that she is taking bupropion (WELLBUTRIN SR) 150 MG as prescribed. She denied experiencing any side effects. Stacie Kennedy shared that her appetite has increased and she is able to eat more. She notes that she currently  has enough food in her home and has heat. The patient denied any suicidal or homicidal thoughts. Duration of problem: Years; Severity of problem: moderate  Objective: Mood: Euthymic and Affect: Appropriate Risk of harm to self or others: No plan to harm self or others  Life Context: Family and Social: see above School/Work: see above Self-Care: see above Life Changes: see above  Patient and/or Family's Strengths/Protective Factors: Concrete supports in place (healthy food, safe environments,  etc.)  Goals Addressed: Patient will:  Reduce symptoms of: agitation, anxiety, depression, insomnia, and stress   Increase knowledge and/or ability of: coping skills, healthy habits, self-management skills, and stress reduction   Demonstrate ability to: Increase healthy adjustment to current life circumstances  Progress towards Goals: Ongoing  Interventions: Interventions utilized:  Supportive Counselingwas utilized by the clinician during today's follow up session. Clinician met with patient to identify needs related to stressors and functioning, and assess and monitor for signs and symptoms of anxiety and depression, and assess safety. The clinician processed with the patient how they have been doing since the last follow-up session. The clinician encouraged the patient to utilize their coping skills to deal with their current life circumstances. Clinician offered to message the patient's primary care provider regarding her arm pain, and encouraged the patient to seek emergency care if her pain did not improve or became worse.  Standardized Assessments completed by Ardeth Sportsman, MSW Intern : GAD-7 and PHQ 9  GAD-7= 4 PHQ-9= 9  Assessment: Patient currently experiencing see above.   Patient may benefit from see above.  Plan: Follow up with behavioral health clinician on : 08/13/2021 at 3:00 PM Behavioral recommendations:  Referral(s): [place] (In Clinic) "From scale of 1-10, how likely are you to follow plan?":   Stacie Kennedy, LCSWA

## 2021-08-05 ENCOUNTER — Ambulatory Visit: Payer: Self-pay | Admitting: Gerontology

## 2021-08-10 ENCOUNTER — Emergency Department
Admission: EM | Admit: 2021-08-10 | Discharge: 2021-08-11 | Disposition: A | Discharge status: Home / Self Care | Payer: Medicaid Other | Attending: Emergency Medicine | Admitting: Emergency Medicine

## 2021-08-10 ENCOUNTER — Encounter: Payer: Self-pay | Admitting: Emergency Medicine

## 2021-08-10 DIAGNOSIS — M79601 Pain in right arm: Secondary | ICD-10-CM | POA: Insufficient documentation

## 2021-08-10 DIAGNOSIS — Z5321 Procedure and treatment not carried out due to patient leaving prior to being seen by health care provider: Secondary | ICD-10-CM | POA: Insufficient documentation

## 2021-08-10 NOTE — ED Triage Notes (Signed)
C/O right arm pain x 1 month.

## 2021-08-12 ENCOUNTER — Encounter: Payer: Self-pay | Admitting: Emergency Medicine

## 2021-08-12 ENCOUNTER — Other Ambulatory Visit: Payer: Self-pay

## 2021-08-12 ENCOUNTER — Emergency Department
Admission: EM | Admit: 2021-08-12 | Discharge: 2021-08-12 | Disposition: A | Discharge status: Home / Self Care | Payer: Medicaid Other | Attending: Emergency Medicine | Admitting: Emergency Medicine

## 2021-08-12 ENCOUNTER — Emergency Department: Payer: Medicaid Other

## 2021-08-12 DIAGNOSIS — M899 Disorder of bone, unspecified: Secondary | ICD-10-CM

## 2021-08-12 DIAGNOSIS — M898X2 Other specified disorders of bone, upper arm: Secondary | ICD-10-CM | POA: Insufficient documentation

## 2021-08-12 DIAGNOSIS — F17210 Nicotine dependence, cigarettes, uncomplicated: Secondary | ICD-10-CM | POA: Insufficient documentation

## 2021-08-12 DIAGNOSIS — M79621 Pain in right upper arm: Secondary | ICD-10-CM | POA: Diagnosis present

## 2021-08-12 DIAGNOSIS — Z7984 Long term (current) use of oral hypoglycemic drugs: Secondary | ICD-10-CM | POA: Insufficient documentation

## 2021-08-12 DIAGNOSIS — Z85118 Personal history of other malignant neoplasm of bronchus and lung: Secondary | ICD-10-CM | POA: Diagnosis not present

## 2021-08-12 DIAGNOSIS — M79601 Pain in right arm: Secondary | ICD-10-CM

## 2021-08-12 LAB — CBC WITH DIFFERENTIAL/PLATELET
Abs Immature Granulocytes: 0.01 10*3/uL (ref 0.00–0.07)
Basophils Absolute: 0.1 10*3/uL (ref 0.0–0.1)
Basophils Relative: 1 %
Eosinophils Absolute: 0.1 10*3/uL (ref 0.0–0.5)
Eosinophils Relative: 2 %
HCT: 44 % (ref 36.0–46.0)
Hemoglobin: 14.4 g/dL (ref 12.0–15.0)
Immature Granulocytes: 0 %
Lymphocytes Relative: 56 %
Lymphs Abs: 3.7 10*3/uL (ref 0.7–4.0)
MCH: 28.7 pg (ref 26.0–34.0)
MCHC: 32.7 g/dL (ref 30.0–36.0)
MCV: 87.6 fL (ref 80.0–100.0)
Monocytes Absolute: 0.5 10*3/uL (ref 0.1–1.0)
Monocytes Relative: 7 %
Neutro Abs: 2.3 10*3/uL (ref 1.7–7.7)
Neutrophils Relative %: 34 %
Platelets: 323 10*3/uL (ref 150–400)
RBC: 5.02 MIL/uL (ref 3.87–5.11)
RDW: 17.4 % — ABNORMAL HIGH (ref 11.5–15.5)
WBC: 6.6 10*3/uL (ref 4.0–10.5)
nRBC: 0 % (ref 0.0–0.2)

## 2021-08-12 LAB — COMPREHENSIVE METABOLIC PANEL
ALT: 10 U/L (ref 0–44)
AST: 17 U/L (ref 15–41)
Albumin: 3.6 g/dL (ref 3.5–5.0)
Alkaline Phosphatase: 74 U/L (ref 38–126)
Anion gap: 4 — ABNORMAL LOW (ref 5–15)
BUN: 15 mg/dL (ref 6–20)
CO2: 27 mmol/L (ref 22–32)
Calcium: 8.8 mg/dL — ABNORMAL LOW (ref 8.9–10.3)
Chloride: 105 mmol/L (ref 98–111)
Creatinine, Ser: 0.71 mg/dL (ref 0.44–1.00)
GFR, Estimated: >60 mL/min (ref >60)
Glucose, Bld: 99 mg/dL (ref 70–99)
Potassium: 4.4 mmol/L (ref 3.5–5.1)
Sodium: 136 mmol/L (ref 135–145)
Total Bilirubin: 0.9 mg/dL (ref 0.3–1.2)
Total Protein: 7 g/dL (ref 6.5–8.1)

## 2021-08-12 MED ORDER — HYDROCODONE-ACETAMINOPHEN 5-325 MG PO TABS: 1.0000 | ORAL_TABLET | Freq: Four times a day (QID) | ORAL | 0 refills | Status: DC | PRN

## 2021-08-12 MED ORDER — HYDROCODONE-ACETAMINOPHEN 5-325 MG PO TABS
1.0000 | ORAL_TABLET | Freq: Once | ORAL | Status: AC
  Administered 2021-08-12: 1 via ORAL
  Filled 2021-08-12: qty 1

## 2021-08-12 NOTE — ED Triage Notes (Signed)
Pt to ED via POV with c/o right arm pain. Pt was here Monday for the same she left before being seen. No known injury to her arm. No deformity seen. Pt reports that it aches all the time. She is able to perform ROM

## 2021-08-12 NOTE — ED Provider Notes (Signed)
[facility] Emergency Department Provider Note  ____________________________________________   Event Date/Time   First MD Initiated Contact with Patient 08/12/21 1458     (approximate)  I have reviewed the triage vital signs and the nursing notes.   HISTORY  Chief Complaint Arm Pain  HPI Dari Carpenito is a 60 y.o. female who reports several days of pain in the right arm.  Movement makes it worse.  In fact she says it is very difficult to raise her arm completely because of the pain.  On palpation the pain is in the mid and upper part of the right humerus.  Pain is achy constant and as I mention made worse by movement.         Past Medical History:  Diagnosis Date   Cancer [facility])    lung cancer   Depression    Duodenitis    Dyspnea    Family history of cancer    High cholesterol    Personal history of colonic polyps    Pre-diabetes    Umbilical hernia    UTI (urinary tract infection)     Patient Active Problem List   Diagnosis Date Noted   Acute pain of right shoulder 07/14/2021   Cyst of bone of left hand 07/14/2021   Shortness of breath 07/14/2021   Genetic testing 05/27/2021   Family history of cancer 04/09/2021   Personal history of colonic polyps 04/09/2021   Health care maintenance 03/19/2021   Major depressive disorder 01/01/2021   Primary cancer of right upper lobe of lung ([place]) 11/03/2020   S/P lobectomy of lung 09/19/2020   Lung mass 08/28/2020   Encounter for screening colonoscopy 05/22/2020   Prediabetes 05/22/2020   Elevated lipids 05/22/2020   Stress incontinence of urine 05/22/2020   Encounter to establish care 04/24/2020   Urinary tract infection symptoms 04/24/2020   Smoking 04/24/2020    Past Surgical History:  Procedure Laterality Date   COLONOSCOPY WITH PROPOFOL N/A 03/24/2021   Procedure: COLONOSCOPY WITH PROPOFOL;  Surgeon: Jonathon Bellows, MD;  Location: [facility] ENDOSCOPY;  Service: Gastroenterology;  Laterality:  N/A;   INTERCOSTAL NERVE BLOCK  09/19/2020   Procedure: INTERCOSTAL NERVE BLOCK;  Surgeon: Melrose Nakayama, MD;  Location: [place];  Service: Thoracic;;   LUNG LOBECTOMY Right    LUNG REMOVAL, PARTIAL Right 09/19/2020   NODE DISSECTION Right 09/19/2020   Procedure: NODE DISSECTION;  Surgeon: Melrose Nakayama, MD;  Location: [place];  Service: Thoracic;  Laterality: Right;   VIDEO BRONCHOSCOPY N/A 07/08/2021   Procedure: VIDEO BRONCHOSCOPY;  Surgeon: Melrose Nakayama, MD;  Location: [facility] OR;  Service: Thoracic;  Laterality: N/A;    Prior to Admission medications   Medication Sig Start Date End Date Taking? Authorizing Provider  albuterol (VENTOLIN HFA) 108 (90 Base) MCG/ACT inhaler Inhale 2 puffs into the lungs every 6 (six) hours as needed for wheezing or shortness of breath. 07/14/21   Iloabachie, Chioma E, NP  buPROPion (WELLBUTRIN SR) 150 MG 12 hr tablet Take 1 tablet (150 mg total) by mouth 2 (two) times daily. 06/23/21   Iloabachie, Chioma E, NP  meloxicam (MOBIC) 7.5 MG tablet Take 1 tablet (7.5 mg total) by mouth daily. Start after stopping prednisone 07/14/21   Iloabachie, Chioma E, NP  metFORMIN (GLUCOPHAGE) 500 MG tablet TAKE ONE TABLET BY MOUTH EVERY MORNING WITH BREAKFAST 07/14/21 07/14/22  Iloabachie, Chioma E, NP  rosuvastatin (CRESTOR) 10 MG tablet TAKE ONE TABLET BY MOUTH EVERY DAY 07/14/21 07/14/22  Iloabachie, Chioma E, NP    Allergies Patient has no known allergies.  Family History  Problem Relation Age of Onset   Diabetes Mother    Cancer Mother    Diabetes Father    Stroke Father    Heart attack Father        50s   Healthy Sister    Cancer Brother    Hepatitis Brother    Diabetes Brother    Hypertension Brother    Heart disease Brother     Social History Social History   Tobacco Use   Smoking status: Some Days    Packs/day: 0.25    Years: 43.00    Pack years: 10.75    Types: Cigarettes   Smokeless tobacco: Never   Tobacco comments:    states  she is slowing, trying to quit  Vaping Use   Vaping Use: Never used  Substance Use Topics   Alcohol use: Yes    Alcohol/week: 2.0 standard drinks    Types: 2 Cans of beer per week    Comment: occasional   Drug use: No    Review of Systems  Constitutional: No fever/chills Eyes: No visual changes. ENT: No sore throat. Cardiovascular: Denies chest pain. Respiratory: Denies shortness of breath. Gastrointestinal: No abdominal pain.  No nausea, no vomiting.  No diarrhea.  No constipation. Genitourinary: Negative for dysuria. Musculoskeletal: Negative for back pain. Skin: Negative for rash. Neurological: Negative for headaches, focal weakness  ____________________________________________   PHYSICAL EXAM:  VITAL SIGNS: ED Triage Vitals  Enc Vitals Group     BP 08/12/21 1158 130/79     Pulse Rate 08/12/21 1158 63     Resp 08/12/21 1158 17     Temp 08/12/21 1158 98.3 F (36.8 C)     Temp Source 08/12/21 1158 Oral     SpO2 08/12/21 1158 97 %     Weight 08/12/21 1159 155 lb (70.3 kg)     Height 08/12/21 1159 5' 7.5" (1.715 m)     Head Circumference --      Peak Flow --      Pain Score 08/12/21 1159 10     Pain Loc --      Pain Edu? --      Excl. in [place]? --     Constitutional: Alert and oriented. Well appearing and in no acute distress. Eyes: Conjunctivae are normal.  Head: Atraumatic. Nose: No congestion/rhinnorhea. Mouth/Throat: Mucous membranes are moist.  Oropharynx non-erythematous. Neck: No stridor.   Cardiovascular: Normal rate, regular rhythm. Grossly normal heart sounds.  Good peripheral circulation. Respiratory: Normal respiratory effort.  No retractions. Lungs CTAB. Gastrointestinal: Soft and nontender. No distention. No abdominal bruits. No CVA tenderness. Musculoskeletal: No lower extremity tenderness nor edema.  Right arm as described in HPI.  There is no tenderness in the forearm or elbow. Neurologic:  Normal speech and language. No gross focal neurologic  deficits are appreciated.  Skin:  Skin is warm, dry and intact. No rash noted.   ____________________________________________   LABS (all labs ordered are listed, but only abnormal results are displayed)  Labs Reviewed  CBC WITH DIFFERENTIAL/PLATELET - Abnormal; Notable for the following components:      Result Value   RDW 17.4 (*)    All other components within normal limits  COMPREHENSIVE METABOLIC PANEL - Abnormal; Notable for the following components:   Calcium 8.8 (*)    Anion gap 4 (*)    All other components within normal limits   ____________________________________________  EKG  ________________________________________  RADIOLOGY Gertha Calkin, personally viewed and evaluated these images (plain radiographs) as part of my medical decision making, as well as reviewing the written report by the radiologist.  ED MD interpretation:    Official radiology report(s): DG Chest 2 View  Result Date: 08/12/2021 CLINICAL DATA:  Right arm pain. EXAM: CHEST - 2 VIEW COMPARISON:  Chest x-ray 07/08/2021 FINDINGS: The cardiac silhouette, mediastinal and hilar contours are within normal limits and stable. Stable surgical and possible radiation changes in the right hilum. There also stable scarring changes in the right upper lobe. No pneumothorax or pleural effusion. No pulmonary lesions. There is a lytic lesion involving the right scapular body. This may have been present on the prior chest x-ray 928 but was much smaller and I do not see it all on the prior chest CT from 05/20/2021. IMPRESSION: 1. No acute cardiopulmonary findings. 2. Stable surgical and possible radiation changes in the right hilum. 3. Lytic lesion involving the right scapular body. Electronically Signed   By: Marijo Sanes M.D.   On: 08/12/2021 16:26   DG Humerus Right  Addendum Date: 08/12/2021   ADDENDUM REPORT: 08/12/2021 16:27 ADDENDUM: After reviewing the chest x-ray I noticed a lesion in the right scapula.  This is very evident on the lateral upper humerus film. This destructive bony lesion is new and likely accounts for the patient's pain. I do not see any other definite bone lesions. PET-CT may be helpful for further evaluation of other disease. Electronically Signed   By: Marijo Sanes M.D.   On: 08/12/2021 16:27   Result Date: 08/12/2021 CLINICAL DATA:  Right arm pain since Monday. EXAM: RIGHT HUMERUS - 2+ VIEW COMPARISON:  None. FINDINGS: Mild AC joint degenerative changes are noted. The glenohumeral joint is maintained. The elbow joint appears normal. The right humerus is intact. No fracture or bone lesions. IMPRESSION: 1. No acute bony findings. 2. Mild AC joint degenerative changes. Electronically Signed: By: Marijo Sanes M.D. On: 08/12/2021 16:19    ____________________________________________   PROCEDURES  Procedure(s) performed (including Critical Care):  Procedures   ____________________________________________   INITIAL IMPRESSION / ASSESSMENT AND PLAN / ED COURSE  ----------------------------------------- 6:54 PM on 08/12/2021 -----------------------------------------  I have discussed the patient with Dr. Grayland Ormond.  He reports Dr. Rogue Bussing will be in the office tomorrow.  Patient has seen Dr. Rogue Bussing in the past for her cancer.  Dr. Grayland Ormond feels Dr. Rogue Bussing would be the best person of follow-up for this problem.  He should be able to see her tomorrow.  I will notify the patient.  Lab work does not show any hypercalcemia or other marked abnormalities.  I will give the patient some Vicodin if needed for pain.          ____________________________________________   FINAL CLINICAL IMPRESSION(S) / ED DIAGNOSES  Final diagnoses:  Right arm pain  Lytic lesion of bone on x-ray     ED Discharge Orders     None        Note:  This document was prepared using Dragon voice recognition software and may include unintentional dictation errors.    Nena Polio, MD 08/12/21 [id number]

## 2021-08-12 NOTE — Discharge Instructions (Addendum)
Your labs look okay.  You do have that on the shoulder blade that needs to be checked.  Please call Dr. Rogue Bussing tomorrow.  He should be in the office.  He will check on the spot on your shoulder starting tomorrow.  As we talked about this may be a sign of recurrent cancer.  We have to act on this fast before it spreads.  I spoke with Dr. Grayland Ormond today he said Dr. Rogue Bussing will be in the office tomorrow and will be expecting your call if he does not call you first.  If you need it you can take the Vicodin 1 pill 4 times a day as needed for pain.  Be careful can make you constipated and sleepy.  Do not drive on it.  Sometimes it can make you groggy as well.  Be careful not to fall.

## 2021-08-12 NOTE — ED Notes (Addendum)
Spoke w/  Pt on phone @[phone], verbalized took out her IV cannula by herself

## 2021-08-13 ENCOUNTER — Telehealth: Payer: Self-pay | Admitting: Licensed Clinical Social Worker

## 2021-08-13 ENCOUNTER — Other Ambulatory Visit: Payer: Self-pay | Admitting: Internal Medicine

## 2021-08-13 ENCOUNTER — Ambulatory Visit: Payer: Self-pay | Admitting: Licensed Clinical Social Worker

## 2021-08-13 ENCOUNTER — Ambulatory Visit
Admission: RE | Admit: 2021-08-13 | Discharge: 2021-08-13 | Disposition: A | Discharge status: Home / Self Care | Payer: Medicaid Other | Source: Ambulatory Visit | Attending: Internal Medicine | Admitting: Internal Medicine

## 2021-08-13 ENCOUNTER — Telehealth: Payer: Self-pay | Admitting: Internal Medicine

## 2021-08-13 DIAGNOSIS — C3411 Malignant neoplasm of upper lobe, right bronchus or lung: Secondary | ICD-10-CM | POA: Insufficient documentation

## 2021-08-13 MED ORDER — IOHEXOL 300 MG/ML  SOLN
75.0000 mL | Freq: Once | INTRAMUSCULAR | Status: AC | PRN
  Administered 2021-08-13: 75 mL via INTRAVENOUS

## 2021-08-13 NOTE — Progress Notes (Signed)
Brooke/[place]- will you assist scheduling this. I will call patient and discuss the plan of care. Thanks.

## 2021-08-13 NOTE — Telephone Encounter (Signed)
Has not had a chance to make calls to [facility], etc. Had emergency room visit yesterday and they prescribed pain medication which she needs assistance filling, as well as recommended she go to counseling with a Dr. B at [place]. Said she called them yesterday but did not get through or leave a message.

## 2021-08-13 NOTE — Telephone Encounter (Signed)
Msg sent to Dr. Jacinto Reap to inquire about the need for a ct scan prior to being evaluated in the clinic vs scheduling in office apt tomorrow.  Pt has a sclerotic lesion seen on xray - concerning for malignancy.

## 2021-08-13 NOTE — Progress Notes (Signed)
I ordered a stat CT scan for this patient.  Please schedule the CT scan; and follow-up with me in 1 to 2 days after the CT scan.  No labs.

## 2021-08-13 NOTE — Telephone Encounter (Signed)
1404-contacted patient- pt aware that Dr. B will order a stat ct scan of chest. MD will see pt back 1-2 days after the ct scan to review results with patient. Pt agreeable to plan of care.

## 2021-08-13 NOTE — Progress Notes (Signed)
1404-contacted patient- pt aware that Dr. B will order a stat ct scan of chest. MD will see pt back 1-2 days after the ct scan to review results with patient. Pt agreeable to plan of care.

## 2021-08-13 NOTE — Telephone Encounter (Signed)
Pt called in to inform us that she was in the ED on yesterday. Needs to have a CAT scan set up. Please call her at [phone]

## 2021-08-14 ENCOUNTER — Encounter: Payer: Self-pay | Admitting: *Deleted

## 2021-08-14 ENCOUNTER — Other Ambulatory Visit: Payer: Self-pay

## 2021-08-14 ENCOUNTER — Telehealth: Payer: Self-pay | Admitting: *Deleted

## 2021-08-14 ENCOUNTER — Encounter: Payer: Self-pay | Admitting: Internal Medicine

## 2021-08-14 ENCOUNTER — Inpatient Hospital Stay: Payer: Medicaid Other | Attending: Internal Medicine | Admitting: Internal Medicine

## 2021-08-14 DIAGNOSIS — C787 Secondary malignant neoplasm of liver and intrahepatic bile duct: Secondary | ICD-10-CM | POA: Diagnosis not present

## 2021-08-14 DIAGNOSIS — E7800 Pure hypercholesterolemia, unspecified: Secondary | ICD-10-CM | POA: Diagnosis not present

## 2021-08-14 DIAGNOSIS — Z510 Encounter for antineoplastic radiation therapy: Secondary | ICD-10-CM | POA: Insufficient documentation

## 2021-08-14 DIAGNOSIS — M25519 Pain in unspecified shoulder: Secondary | ICD-10-CM | POA: Insufficient documentation

## 2021-08-14 DIAGNOSIS — Z7984 Long term (current) use of oral hypoglycemic drugs: Secondary | ICD-10-CM | POA: Insufficient documentation

## 2021-08-14 DIAGNOSIS — C3411 Malignant neoplasm of upper lobe, right bronchus or lung: Secondary | ICD-10-CM

## 2021-08-14 DIAGNOSIS — G893 Neoplasm related pain (acute) (chronic): Secondary | ICD-10-CM | POA: Insufficient documentation

## 2021-08-14 DIAGNOSIS — M79601 Pain in right arm: Secondary | ICD-10-CM | POA: Insufficient documentation

## 2021-08-14 DIAGNOSIS — I2510 Atherosclerotic heart disease of native coronary artery without angina pectoris: Secondary | ICD-10-CM | POA: Diagnosis not present

## 2021-08-14 DIAGNOSIS — I700 Atherosclerosis of aorta: Secondary | ICD-10-CM | POA: Insufficient documentation

## 2021-08-14 DIAGNOSIS — Z8619 Personal history of other infectious and parasitic diseases: Secondary | ICD-10-CM | POA: Insufficient documentation

## 2021-08-14 DIAGNOSIS — Z87440 Personal history of urinary (tract) infections: Secondary | ICD-10-CM | POA: Insufficient documentation

## 2021-08-14 DIAGNOSIS — C7951 Secondary malignant neoplasm of bone: Secondary | ICD-10-CM | POA: Diagnosis not present

## 2021-08-14 DIAGNOSIS — F17210 Nicotine dependence, cigarettes, uncomplicated: Secondary | ICD-10-CM | POA: Diagnosis not present

## 2021-08-14 NOTE — Telephone Encounter (Signed)
Called report  IMPRESSION: Large destructive mass centered in the body of the scapula along the lateral margin with frank cortical destruction and enhancing soft tissue.   Signs of hepatic metastatic disease largest lesion in hepatic subsegment II as described. Postoperative changes in the chest and associated volume loss in the RIGHT middle lobe with similar appearance to prior imaging.   Potential nodal disease in the upper abdomen as well.   These results will be called to the ordering clinician or representative by the Radiologist Assistant, and communication documented in the PACS or [employer].   Aortic Atherosclerosis (ICD10-I70.0) and Emphysema (ICD10-J43.9).     Electronically Signed   By: Zetta Bills M.D.   On: 08/13/2021 16:33

## 2021-08-14 NOTE — Progress Notes (Signed)
Pt has concerns about ct results. C/o right arm pain x 1.5 months.

## 2021-08-14 NOTE — Progress Notes (Signed)
Met with patient during follow up visit with Dr. B to discuss concern for possible lung cancer recurrence based on recent imaging. All questions answered during visit. Reassurance provided. Reviewed upcoming appts for PET scan and informed will be called on Monday to schedule appt to see Dr. Baruch Gouty. Contact info given and instructed to call with any questions or needs. Pt verbalized understanding.

## 2021-08-14 NOTE — Progress Notes (Signed)
Stacie Kennedy CONSULT NOTE  Patient Care Team: Langston Reusing, NP as PCP - General (Gerontology) Telford Nab, RN as Oncology Nurse Navigator  CHIEF COMPLAINTS/PURPOSE OF CONSULTATION: lung cancer  #  Oncology History Overview Note  # NOV -[id number] [facility] CANCER SCREENING PROGRAM]-18 mm right upper lobe lung nodule; DEC 2021- s/p right upper lobectomy [Dr. Roxan Hockey; [place]]; STAGE: I [pT-18 mm; LN-12=0]; predominant large cell neuroendocrine; minor adenocarcinoma.  Declines adjuvant chemotherapy.  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  Visceral Pleura Invasion: Not identified  Direct Invasion of Adjacent Structures: No adjacent structures present  Lymphovascular Invasion: Not identified  Margins: All margins negative for invasive carcinoma       Closest Margin(s) to Invasive Carcinoma: Bronchovascular margin  Treatment Effect: No known presurgical therapy  Regional Lymph Nodes:       Number of Lymph Nodes Involved: 0                            Nodal Sites with Tumor: Not applicable       Number of Lymph Nodes Examined: 12    Primary cancer of right upper lobe of lung ([place])  11/03/2020 Initial Diagnosis   Primary cancer of right upper lobe of lung ([place])   11/03/2020 Cancer Staging   Staging form: Lung, AJCC 8th Edition - Pathologic: Stage IA3 (pT1c, pN0, cM0) - Signed by Cammie Sickle, MD on 11/04/2020       HISTORY OF PRESENTING ILLNESS: Ambulating independently.  Alone. Stacie Kennedy 60 y.o.  female history of smoking predominant large cell neuroendocrine cancer -is here  today with results of her imaging/CT scan.  Patient was recently evaluated in the emergency room for right posterior chest wall/shoulder pain.  X-ray concerning for lytic lesion in the scapula.   Complains of ongoing shoulder pain.   Denies any headaches.  Denies any nausea or vomiting.  Chronic shortness of breath/chronic mild cough no hemoptysis.   Review of Systems  Constitutional:  Negative for chills, diaphoresis, fever, malaise/fatigue and weight loss.  HENT:  Negative for nosebleeds and sore throat.   Eyes:  Negative for double vision.  Respiratory:  Negative for cough, hemoptysis, sputum production, shortness of breath and wheezing.   Cardiovascular:  Negative for chest pain, palpitations, orthopnea and leg swelling.  Gastrointestinal:  Negative for abdominal pain, blood in stool, constipation, diarrhea, heartburn, melena, nausea and vomiting.  Genitourinary:  Negative for dysuria, frequency and urgency.  Musculoskeletal:  Positive for back pain. Negative for joint pain.  Skin: Negative.  Negative for itching and rash.  Neurological:  Negative for dizziness, tingling, focal weakness, weakness and headaches.  Endo/Heme/Allergies:  Does not bruise/bleed easily.  Psychiatric/Behavioral:  Negative for depression. The patient is not nervous/anxious and does not have insomnia.     MEDICAL HISTORY:  Past Medical History:  Diagnosis Date   Cancer [facility])    lung cancer   Depression    Duodenitis    Dyspnea    Family history of cancer    High cholesterol  Personal history of colonic polyps    Pre-diabetes    Umbilical hernia    UTI (urinary tract infection)     SURGICAL HISTORY: Past Surgical History:  Procedure Laterality Date   COLONOSCOPY WITH PROPOFOL N/A 03/24/2021   Procedure: COLONOSCOPY WITH PROPOFOL;  Surgeon: Jonathon Bellows, MD;  Location: [facility] ENDOSCOPY;  Service: Gastroenterology;  Laterality: N/A;   INTERCOSTAL NERVE BLOCK  09/19/2020   Procedure: INTERCOSTAL  NERVE BLOCK;  Surgeon: Melrose Nakayama, MD;  Location: [facility];  Service: Thoracic;;   LUNG LOBECTOMY Right    LUNG REMOVAL, PARTIAL Right 09/19/2020   NODE DISSECTION Right 09/19/2020   Procedure: NODE DISSECTION;  Surgeon: Melrose Nakayama, MD;  Location: [facility];  Service: Thoracic;  Laterality: Right;   VIDEO BRONCHOSCOPY N/A 07/08/2021   Procedure: VIDEO BRONCHOSCOPY;  Surgeon: Melrose Nakayama, MD;  Location: [facility] OR;  Service: Thoracic;  Laterality: N/A;    SOCIAL HISTORY: Social History   Socioeconomic History   Marital status: Single    Spouse name: Not on file   Number of children: Not on file   Years of education: Not on file   Highest education level: Not on file  Occupational History   Not on file  Tobacco Use   Smoking status: Some Days    Packs/day: 0.25    Years: 43.00    Pack years: 10.75    Types: Cigarettes   Smokeless tobacco: Never   Tobacco comments:    states she is slowing, trying to quit  Vaping Use   Vaping Use: Never used  Substance and Sexual Activity   Alcohol use: Yes    Alcohol/week: 2.0 standard drinks    Types: 2 Cans of beer per week    Comment: occasional   Drug use: No   Sexual activity: Not on file  Other Topics Concern   Not on file  Social History Narrative   Lives in [place]; smokes; now and then beer; with boy friend. 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    Social Determinants of Health   Financial Resource Strain: Not on file  Food Insecurity: Food Insecurity Present   Worried About [place] in the Last Year: Sometimes true   Ran Out of Food in the Last Year: Sometimes true  Transportation Needs: No Transportation Needs   Lack of Transportation (Medical): No   Lack of Transportation (Non-Medical): No  Physical Activity: Insufficiently Active   Days of Exercise per Week: 7 days   Minutes of Exercise per Session: 20 min  Stress: Stress Concern Present   Feeling of Stress : To some  extent  Social Connections: Socially Isolated   Frequency of Communication with Friends and Family: Never   Frequency of Social Gatherings with Friends and Family: Never   Attends Religious Services: Never   Printmaker: No   Attends Music therapist: Never   Marital Status: Never married  Human resources officer Violence: Not on file    FAMILY HISTORY: Family History  Problem Relation Age of Onset   Diabetes Mother    Cancer Mother    Diabetes Father    Stroke Father    Heart attack Father        28s   Healthy Sister    Cancer Brother    Hepatitis Brother    Diabetes Brother    Hypertension Brother    Heart disease Brother     ALLERGIES:  has No Known Allergies.  MEDICATIONS:  Current Outpatient Medications  Medication Sig Dispense Refill   albuterol (VENTOLIN HFA) 108 (90 Base) MCG/ACT inhaler Inhale 2 puffs into the lungs every 6 (six) hours as needed for wheezing or shortness of breath. 6.7 g 2   buPROPion (WELLBUTRIN SR) 150 MG 12 hr tablet Take 1 tablet (150 mg total) by mouth 2 (two) times daily. 60 tablet 0   HYDROcodone-acetaminophen (NORCO/VICODIN) 5-325 MG tablet Take 1 tablet by mouth every 6 (six) hours as needed for moderate pain. 30 tablet 0   meloxicam (MOBIC) 7.5 MG tablet Take 1 tablet (7.5 mg total) by mouth daily. Start after stopping prednisone 14 tablet 0   metFORMIN (GLUCOPHAGE) 500 MG tablet TAKE ONE TABLET BY MOUTH EVERY MORNING WITH BREAKFAST 30 tablet 3   rosuvastatin (CRESTOR) 10 MG tablet TAKE ONE TABLET BY MOUTH EVERY DAY 30 tablet 3   No current facility-administered medications for this visit.      Marland Kitchen  PHYSICAL EXAMINATION: ECOG PERFORMANCE STATUS: 0 - Asymptomatic  Vitals:   08/14/21 1444  BP: 118/73  Pulse: 78  Resp: 18  Temp: 97.9 F (36.6 C)  SpO2: 100%    Filed Weights   08/14/21 1444  Weight: 167 lb 9.6 oz (76 kg)     Physical Exam Constitutional:      Comments: Alone; ambulating  independently.   HENT:     Head: Normocephalic and atraumatic.     Mouth/Throat:     Pharynx: No oropharyngeal exudate.  Eyes:     Pupils: Pupils are equal, round, and reactive to light.  Cardiovascular:     Rate and Rhythm: Normal rate and regular rhythm.  Pulmonary:     Effort: Pulmonary effort is normal. No respiratory distress.     Breath sounds: Normal breath sounds. No wheezing.  Abdominal:     General: Bowel sounds are normal. There is no distension.     Palpations: Abdomen is soft. There is no mass.     Tenderness: There is no abdominal tenderness. There is no guarding or rebound.  Musculoskeletal:        General: No tenderness. Normal range of motion.     Cervical back: Normal range of motion and neck supple.  Skin:    General: Skin is warm.  Neurological:     Mental Status: She is alert and oriented to person, place, and time.  Psychiatric:        Mood and Affect: Affect normal.     LABORATORY DATA:  I have reviewed the data as listed Lab Results  Component Value Date   WBC 6.6 08/12/2021   HGB 14.4 08/12/2021   HCT 44.0 08/12/2021   MCV 87.6 08/12/2021   PLT 323 08/12/2021   Recent Labs    11/26/20 1214 05/20/21 1343 05/22/21 1500 08/12/21 1750  NA 139  --  139 136  K 4.1  --  4.1 4.4  CL 103  --  105 105  CO2 22  --  26 27  GLUCOSE 99  --  100* 99  BUN 8  --  14 15  CREATININE 0.69 0.50 0.72 0.71  CALCIUM 9.5  --  9.8 8.8*  GFRNONAA 96  --  >60 >60  GFRAA 110  --   --   --   PROT 6.9  --  8.0 7.0  ALBUMIN 4.0  --  3.7 3.6  AST 18  --  13* 17  ALT 9  --  9 10  ALKPHOS 113  --  91 74  BILITOT 0.3  --  0.4 0.9    RADIOGRAPHIC STUDIES: I have personally reviewed the radiological images as listed and agreed with the findings in the report. DG Chest 2 View  Result Date: 08/12/2021 CLINICAL DATA:  Right arm pain. EXAM: CHEST - 2 VIEW COMPARISON:  Chest x-ray 07/08/2021 FINDINGS: The cardiac silhouette, mediastinal and hilar contours are within  normal limits and stable. Stable surgical and possible radiation changes in the right hilum. There also stable scarring changes in the right upper lobe. No pneumothorax or pleural effusion. No pulmonary lesions. There is a lytic lesion involving the right scapular body. This may have been present on the prior chest x-ray 928 but was much smaller and I do not see it all on the prior chest CT from 05/20/2021. IMPRESSION: 1. No acute cardiopulmonary findings. 2. Stable surgical and possible radiation changes in the right hilum. 3. Lytic lesion involving the right scapular body. Electronically Signed   By: Marijo Sanes M.D.   On: 08/12/2021 16:26   CT CHEST W CONTRAST  Result Date: 08/13/2021 CLINICAL DATA:  Pretreatment staging for non-small cell lung cancer in a 60 year old female. EXAM: CT CHEST WITH CONTRAST TECHNIQUE: Multidetector CT imaging of the chest was performed during intravenous contrast administration. CONTRAST:  63mL OMNIPAQUE IOHEXOL 300 MG/ML  SOLN COMPARISON:  May 20, 2021. FINDINGS: Cardiovascular: Aortic atherosclerosis, coronary artery disease and postoperative alteration of RIGHT hilum as before. No substantial pericardial effusion. No signs of aortic aneurysm. Mediastinum/Nodes: No thoracic inlet adenopathy. No axillary adenopathy. No mediastinal adenopathy. Lungs/Pleura: Near complete collapse of RIGHT middle lobe following RIGHT upper lobectomy. This is similar to the prior study. No effusion. No consolidative process. Basilar scarring. Airways are patent. Upper Abdomen: Hepatic lesion measuring up to 4.6 x 3.8 cm (image 130/2). Hepatic subsegment II, LEFT hepatic lobe near the IVC confluence. Hepatic lesion in the LEFT hepatic lobe (image 138/2) 13 mm. Subtle low-density in the RIGHT hepatic lobe may also represent a metastatic lesion (image 175/2) 10 mm. Signs of mildly enlarged gastrohepatic lymph node (image 145/2) 12 mm. Stable benign appearing LEFT adrenal lesion,  well-circumscribed and 2.6 cm. No acute gastrointestinal process to the extent evaluated. Musculoskeletal: Large destructive lesion in the RIGHT scapula destroying the lateral scapular body soft tissue measuring 3.6 x 3.2 cm (image 40/2) Subtle lytic changes in the posterior margin/posterior cortex of the sternal manubrium are similar and may be developmental measuring 2-3 mm, for example on image 36/2 and images just below this level. No additional suspicious bone abnormality. IMPRESSION: Large destructive mass centered in the body of the scapula along the lateral margin with frank cortical destruction and enhancing soft tissue. Signs of hepatic metastatic disease largest lesion in hepatic subsegment II as described. Postoperative changes in the chest and associated volume loss in the RIGHT middle lobe with similar appearance to prior imaging. Potential nodal disease in the upper abdomen as well. These results will be called to the ordering clinician or representative by the Radiologist Assistant, and communication documented in the PACS or [employer]. Aortic Atherosclerosis (ICD10-I70.0) and Emphysema (ICD10-J43.9). Electronically Signed   By: Zetta Bills M.D.   On: 08/13/2021 16:33   DG Humerus Right  Addendum Date: 08/12/2021   ADDENDUM REPORT: 08/12/2021 16:27 ADDENDUM: After reviewing the chest x-ray I noticed a lesion in the right scapula. This is very evident on the lateral upper humerus film. This destructive bony lesion is new and likely accounts for the patient's pain. I do  not see any other definite bone lesions. PET-CT may be helpful for further evaluation of other disease. Electronically Signed   By: Marijo Sanes M.D.   On: 08/12/2021 16:27   Result Date: 08/12/2021 CLINICAL DATA:  Right arm pain since Monday. EXAM: RIGHT HUMERUS - 2+ VIEW COMPARISON:  None. FINDINGS: Mild AC joint degenerative changes are noted. The glenohumeral joint is maintained. The elbow joint appears normal. The  right humerus is intact. No fracture or bone lesions. IMPRESSION: 1. No acute bony findings. 2. Mild AC joint degenerative changes. Electronically Signed: By: Marijo Sanes M.D. 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  Order NGS.  #Long discussion with patient regarding concerns for recurrent disease/metastatic stage IV incurable disease.  However this could be treated with chemotherapy.  Recommend a PET scan for further evaluation.   # pain control/scapular metastases right-recommend evaluation with Dr. Donella Stade ASAP.  Continue hydrocodone 1 pill every 6 hours.  We will refill when needed.  # I offered to call the patient's family/friend to give an update on clinical status; patient declines.   # DISPOSITION: # referral to Dr.Chrystal re: Scapular metaatses PET scan ASAP # follow up 1-2 days post PET scan-Dr.B  # I reviewed the blood work- with the patient in detail; also reviewed the imaging independently [as summarized above]; and with the patient in detail.   .    All questions were answered. The patient knows to call the clinic with any problems, questions or concerns.    Cammie Sickle, MD 08/14/2021 4:19 PM

## 2021-08-14 NOTE — Assessment & Plan Note (Addendum)
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  Order NGS.  #Long discussion with patient regarding concerns for recurrent disease/metastatic stage IV incurable disease.  However this could be treated with chemotherapy.  Recommend a PET scan for further evaluation.   # pain control/scapular metastases right-recommend evaluation with Dr. Donella Stade ASAP.  Continue hydrocodone 1 pill every 6 hours.  We will refill when needed.  # I offered to call the patient's family/friend to give an update on clinical status; patient declines.   # DISPOSITION: # referral to Dr.Chrystal re: Scapular metaatses PET scan ASAP # follow up 1-2 days post PET scan-Dr.B  # I reviewed the blood work- with the patient in detail; also reviewed the imaging independently [as summarized above]; and with the patient in detail.   Stacie Kennedy

## 2021-08-17 ENCOUNTER — Encounter: Payer: Self-pay | Admitting: *Deleted

## 2021-08-17 ENCOUNTER — Other Ambulatory Visit: Payer: Self-pay | Admitting: *Deleted

## 2021-08-17 NOTE — Telephone Encounter (Signed)
Spoke with patient to review upcoming appt to see Dr. Baruch Gouty on 11/11 at 10am. Pt made aware of appt. While on the phone, pt stated that she has been taking her pain meds every 6 hours as prescribed and has not helped relieve her pain. States works very little to relieve her pain. States that if Dr. B wants to keep her on the same dose then will need a refill as well.   Please advise.

## 2021-08-17 NOTE — Progress Notes (Signed)
Order for [employer] submitted via online portal. Pt is currently self-pay. Applied for financial assistance on pt's behalf.

## 2021-08-18 DIAGNOSIS — D492 Neoplasm of unspecified behavior of bone, soft tissue, and skin: Secondary | ICD-10-CM | POA: Insufficient documentation

## 2021-08-18 MED ORDER — OXYCODONE-ACETAMINOPHEN 10-325 MG PO TABS: 1.0000 | ORAL_TABLET | Freq: Four times a day (QID) | ORAL | 0 refills | Status: DC | PRN

## 2021-08-18 NOTE — Telephone Encounter (Signed)
Attempted to contact pt today to inform of refill being sent into pharmacy. Unable to leave message due to mailbox being full.

## 2021-08-19 ENCOUNTER — Other Ambulatory Visit: Payer: Self-pay

## 2021-08-20 ENCOUNTER — Ambulatory Visit: Payer: Self-pay | Admitting: Gerontology

## 2021-08-21 ENCOUNTER — Ambulatory Visit
Admission: RE | Admit: 2021-08-21 | Discharge: 2021-08-21 | Disposition: A | Discharge status: Home / Self Care | Payer: MEDICAID | Source: Ambulatory Visit | Attending: Radiation Oncology | Admitting: Radiation Oncology

## 2021-08-21 ENCOUNTER — Other Ambulatory Visit: Payer: Self-pay

## 2021-08-21 ENCOUNTER — Encounter: Payer: Self-pay | Admitting: Radiation Oncology

## 2021-08-21 ENCOUNTER — Encounter: Payer: Self-pay | Admitting: *Deleted

## 2021-08-21 VITALS — BP 127/86 | HR 68 | Temp 98.0°F | Resp 16 | Wt 154.6 lb

## 2021-08-21 DIAGNOSIS — C7951 Secondary malignant neoplasm of bone: Secondary | ICD-10-CM

## 2021-08-21 NOTE — Progress Notes (Signed)
Met with patient during initial consult with Dr. Baruch Gouty. Reviewed pain medications with patient and how to take. Pt was tearful during visit. Reassurance provided. Reviewed upcoming appts with patient. Informed that will follow up with pt at next appt on Monday. Instructed to call with any questions or needs. Pt verbalized understanding.

## 2021-08-21 NOTE — Consult Note (Signed)
NEW PATIENT EVALUATION  Name: Stacie Kennedy  MRN: [id number]  Date:   08/21/2021     DOB: 1961-03-08   This 60 y.o. 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  REFERRING PHYSICIAN: Iloabachie, Chioma E, NP  CHIEF COMPLAINT:  Chief Complaint  Patient presents with   Cancer scapula    DIAGNOSIS: The encounter diagnosis was Bone metastases ([place]).   PREVIOUS INVESTIGATIONS:  CT scans reviewed PET CT scans ordered Pathology report reviewed Clinical notes reviewed  HPI: Patient is a 60 year old female picked up with a right upper lobe lesion on screening CT scan back in November 2021.  She underwent a right upper lobectomy for stage I 1.8 cm lesion predominantly large cell neuroendocrine tumor with minor adenocarcinoma component.  Patient declined adjuvant chemotherapy.  She is recently presented with significant right shoulder pain evaluated in ER found to have a lytic lesion in the right scapula.  She is also noted to have liver mass metastasis.  She is scheduled for PET scan can next week.  She is now referred to radiation oncology for consideration of palliative treatment to her right scapula.  PLANNED TREATMENT REGIMEN: Palliative radiation to her right scapula  PAST MEDICAL HISTORY:  has a past medical history of Cancer ([place]), Depression, Duodenitis, Dyspnea, Family history of cancer, High cholesterol, Personal history of colonic polyps, Pre-diabetes, Umbilical hernia, and UTI (urinary tract infection).    PAST SURGICAL HISTORY:  Past Surgical History:  Procedure Laterality Date   COLONOSCOPY WITH PROPOFOL N/A 03/24/2021   Procedure: COLONOSCOPY WITH PROPOFOL;  Surgeon: Jonathon Bellows, MD;  Location: [facility] ENDOSCOPY;  Service: Gastroenterology;  Laterality: N/A;    INTERCOSTAL NERVE BLOCK  09/19/2020   Procedure: INTERCOSTAL NERVE BLOCK;  Surgeon: Melrose Nakayama, MD;  Location: [facility];  Service: Thoracic;;   LUNG LOBECTOMY Right    LUNG REMOVAL, PARTIAL Right 09/19/2020   NODE DISSECTION Right 09/19/2020   Procedure: NODE DISSECTION;  Surgeon: Melrose Nakayama, MD;  Location: [facility];  Service: Thoracic;  Laterality: Right;   VIDEO BRONCHOSCOPY N/A 07/08/2021   Procedure: VIDEO BRONCHOSCOPY;  Surgeon: Melrose Nakayama, MD;  Location: [facility] OR;  Service: Thoracic;  Laterality: N/A;    FAMILY HISTORY: family history includes Cancer in her brother and mother; Diabetes in her brother, father, and mother; Healthy in her sister; Heart attack in her father; Heart disease in her brother; Hepatitis in her brother; Hypertension in her brother; Stroke in her father.  SOCIAL HISTORY:  reports that she has been smoking cigarettes. She has a 10.75 pack-year smoking history. She has never used smokeless tobacco. She reports current alcohol use of about 2.0 standard drinks per week. She reports that she does not use drugs.  ALLERGIES: Patient has no known allergies.  MEDICATIONS:  Current Outpatient Medications  Medication Sig Dispense Refill   buPROPion (WELLBUTRIN SR) 150 MG 12 hr tablet Take 1 tablet (150 mg total) by mouth 2 (two) times daily. 60 tablet 0   HYDROcodone-acetaminophen (NORCO/VICODIN) 5-325 MG tablet Take 1 tablet by mouth every 6 (six) hours as needed for moderate pain. 30 tablet 0   meloxicam (MOBIC) 7.5 MG tablet Take 1 tablet (7.5 mg total) by mouth daily. Start after stopping prednisone 14 tablet 0   metFORMIN (GLUCOPHAGE) 500 MG tablet TAKE ONE TABLET BY MOUTH EVERY  MORNING WITH BREAKFAST 30 tablet 3   oxyCODONE-acetaminophen (PERCOCET) 10-325 MG tablet Take 1 tablet by mouth every 6 (six) hours as needed for pain. 30 tablet 0   rosuvastatin (CRESTOR) 10 MG tablet TAKE ONE TABLET BY MOUTH EVERY DAY 30 tablet 3   albuterol (VENTOLIN  HFA) 108 (90 Base) MCG/ACT inhaler Inhale 2 puffs into the lungs every 6 (six) hours as needed for wheezing or shortness of breath. (Patient not taking: Reported on 08/21/2021) 6.7 g 2   No current facility-administered medications for this encounter.    ECOG PERFORMANCE STATUS:  1 - Symptomatic but completely ambulatory  REVIEW OF SYSTEMS: Patient denies any weight loss, fatigue, weakness, fever, chills or night sweats. Patient denies any loss of vision, blurred vision. Patient denies any ringing  of the ears or hearing loss. No irregular heartbeat. Patient denies heart murmur or history of fainting. Patient denies any chest pain or pain radiating to her upper extremities. Patient denies any shortness of breath, difficulty breathing at night, cough or hemoptysis. Patient denies any swelling in the lower legs. Patient denies any nausea vomiting, vomiting of blood, or coffee ground material in the vomitus. Patient denies any stomach pain. Patient states has had normal bowel movements no significant constipation or diarrhea. Patient denies any dysuria, hematuria or significant nocturia. Patient denies any problems walking, swelling in the joints or loss of balance. Patient denies any skin changes, loss of hair or loss of weight. Patient denies any excessive worrying or anxiety or significant depression. Patient denies any problems with insomnia. Patient denies excessive thirst, polyuria, polydipsia. Patient denies any swollen glands, patient denies easy bruising or easy bleeding. Patient denies any recent infections, allergies or URI. Patient "s visual fields have not changed significantly in recent time.   PHYSICAL EXAM: BP 127/86   Pulse 68   Temp 98 F (36.7 C) (Tympanic)   Resp 16   Wt 154 lb 9.6 oz (70.1 kg)   BMI 23.86 kg/m  There is significant pain on palpation of her posterior right scapula in the main body.  Well-developed well-nourished patient in NAD. HEENT reveals PERLA, EOMI, discs not  visualized.  Oral cavity is clear. No oral mucosal lesions are identified. Neck is clear without evidence of cervical or supraclavicular adenopathy. Lungs are clear to A&P. Cardiac examination is essentially unremarkable with regular rate and rhythm without murmur rub or thrill. Abdomen is benign with no organomegaly or masses noted. Motor sensory and DTR levels are equal and symmetric in the upper and lower extremities. Cranial nerves II through XII are grossly intact. Proprioception is intact. No peripheral adenopathy or edema is identified. No motor or sensory levels are noted. Crude visual fields are within normal range.  LABORATORY DATA: Pathology report reviewed    RADIOLOGY RESULTS: CT scan reviewed PET CT scan will be reviewed after completion next week.   IMPRESSION: Stage IV lung cancer with metastasis to both liver and right scapula in 60 year old female  PLAN: At this time of offered palliative radiation therapy to her right scapula.  We will plan on delivering 30 Gray in 10 fractions.  We will use fields that will spare her lung causing less significant side effects.  Risks and benefits of treatment including skin reaction fatigue all were discussed in detail with the patient.  I set her up for simulation first thing Monday morning.  Patient knows to call with any concerns.  We will make follow-up recommendations after review of her PET CT scan next week.  I would like to take this opportunity to thank you for allowing me to participate in the care of your patient.Noreene Filbert, MD

## 2021-08-24 ENCOUNTER — Ambulatory Visit
Admission: RE | Admit: 2021-08-24 | Discharge: 2021-08-24 | Disposition: A | Discharge status: Home / Self Care | Payer: Medicaid Other | Source: Ambulatory Visit | Attending: Radiation Oncology | Admitting: Radiation Oncology

## 2021-08-24 ENCOUNTER — Encounter: Payer: Self-pay | Admitting: *Deleted

## 2021-08-24 DIAGNOSIS — C3411 Malignant neoplasm of upper lobe, right bronchus or lung: Secondary | ICD-10-CM | POA: Insufficient documentation

## 2021-08-24 DIAGNOSIS — C7951 Secondary malignant neoplasm of bone: Secondary | ICD-10-CM | POA: Insufficient documentation

## 2021-08-24 DIAGNOSIS — Z510 Encounter for antineoplastic radiation therapy: Secondary | ICD-10-CM | POA: Insufficient documentation

## 2021-08-24 NOTE — Progress Notes (Signed)
Met with patient after CT simulation today. All questions answered during visit. Reviewed upcoming appts. Instructed pt to call with any questions or needs. Pt verbalized understanding.

## 2021-08-25 ENCOUNTER — Telehealth: Payer: Self-pay | Admitting: Licensed Clinical Social Worker

## 2021-08-25 DIAGNOSIS — Z510 Encounter for antineoplastic radiation therapy: Secondary | ICD-10-CM | POA: Diagnosis not present

## 2021-08-25 NOTE — Telephone Encounter (Signed)
Called to check in. No answer and mailbox was full. Will try again Thursday.

## 2021-08-26 ENCOUNTER — Ambulatory Visit: Payer: Self-pay | Admitting: Licensed Clinical Social Worker

## 2021-08-26 ENCOUNTER — Other Ambulatory Visit: Payer: Self-pay

## 2021-08-26 ENCOUNTER — Encounter: Payer: Self-pay | Admitting: Internal Medicine

## 2021-08-26 DIAGNOSIS — F411 Generalized anxiety disorder: Secondary | ICD-10-CM

## 2021-08-26 DIAGNOSIS — F331 Major depressive disorder, recurrent, moderate: Secondary | ICD-10-CM

## 2021-08-26 NOTE — BH Specialist Note (Signed)
Integrated Behavioral Health Follow Up In-Person Visit  MRN: [id number] Name: Stacie Kennedy   Total time: 60 minutes  Types of Service: Telephone visit Patient consents to telephone visit and 2 patient identifiers were used to identify patient   Interpretor:No. Interpretor Name and Language: N/A  Subjective: Stacie Kennedy is a 60 y.o. female accompanied by  herself  Patient was referred by Carlyon Shadow, NP for Mental Health. Patient reports the following symptoms/concerns: The patient stated she has been feeling more depressed since her last follow-up appointment. She shared that her health has worsened and the pain she was having in her shoulder is cancer. She shared that the cancer has also spread to her liver. Ms. Markert described feeling hopeless and tearful. She described feeling shocked at the news that her cancer has metastasized.  She stated she has no supports other than her sister. She explained her sister is not able to do much for her because of her situation. The patient stated she had food and heat, but lacked any income. She shared she needs clothing, but is unable to go shopping or have the funds or means to purchase online. She shared she needs help paying for her living expenses.  She explained that she feels alone and is very tired. She explained that her pain medication is making her very tired. Ms. Whitford stated that she did not need a refill on her Wellbutrin, because she has plenty. The patient stated she is taking her medication sparingly because she doesn't want to be on too many medications. The patient denied any suicidal or homicidal thoughts.  Duration of problem: Years; Severity of problem: moderate  Objective: Mood: Depressed and Affect: Appropriate Risk of harm to self or others: No plan to harm self or others  Life Context: Family and Social: see above School/Work: see above Self-Care: see above Life Changes: see above   Patient and/or Family's  Strengths/Protective Factors: Social and Emotional competence and Concrete supports in place (healthy food, safe environments, etc.)  Goals Addressed: Patient will:  Reduce symptoms of: agitation, anxiety, depression, and stress   Increase knowledge and/or ability of: coping skills, healthy habits, self-management skills, and stress reduction   Demonstrate ability to: Increase healthy adjustment to current life circumstances and Increase adequate support systems for patient/family  Progress towards Goals: Ongoing  Interventions: Interventions utilized:  CBT Cognitive Behavioral Therapy was utilized by the clinician during today's follow up session. Clinician met with patient to identify needs related to stressors and functioning, and assess and monitor for signs and symptoms of anxiety and depression, and assess safety. The clinician processed with the patient how they have been doing since the last follow-up session. Clinician encouraged the patient to take their mediation at the same time everyday exactly as prescribed for it to reach it's full intended effect.   Clinician introduced distress tolerance skills to the patient and explained that negative emotions will usually lessen in intensity and pass over time. Clinician explained that she we search for community resources for the patient and follow up with her next week.  Standardized Assessments completed: GAD-7 and PHQ 9 PHQ-9 = 19 GAD-7 = 18  Patient and/or Family Response: see above  Patient Centered Plan: Patient is on the following Treatment Plan(s): see above Assessment: Patient currently experiencing see above.   Patient may benefit from see above.  Plan: Follow up with behavioral health clinician on : 09/02/21 at 10:00 am  Behavioral recommendations:  Referral(s): [place] (In Clinic) "From  scale of 1-10, how likely are you to follow plan?":   Lesli Albee, LCSWA

## 2021-08-27 ENCOUNTER — Other Ambulatory Visit: Payer: Self-pay

## 2021-08-27 ENCOUNTER — Ambulatory Visit: Admission: RE | Admit: 2021-08-27 | Payer: Medicaid Other | Source: Ambulatory Visit

## 2021-08-27 ENCOUNTER — Telehealth: Payer: Self-pay | Admitting: Licensed Clinical Social Worker

## 2021-08-27 ENCOUNTER — Ambulatory Visit: Payer: Self-pay | Admitting: Gerontology

## 2021-08-27 ENCOUNTER — Ambulatory Visit
Admission: RE | Admit: 2021-08-27 | Discharge: 2021-08-27 | Disposition: A | Discharge status: Home / Self Care | Payer: Medicaid Other | Source: Ambulatory Visit | Attending: Internal Medicine | Admitting: Internal Medicine

## 2021-08-27 ENCOUNTER — Inpatient Hospital Stay (HOSPITAL_BASED_OUTPATIENT_CLINIC_OR_DEPARTMENT_OTHER): Payer: Medicaid Other | Admitting: Hospice and Palliative Medicine

## 2021-08-27 VITALS — BP 123/81 | HR 64 | Temp 98.0°F | Resp 16

## 2021-08-27 DIAGNOSIS — C3411 Malignant neoplasm of upper lobe, right bronchus or lung: Secondary | ICD-10-CM

## 2021-08-27 DIAGNOSIS — C772 Secondary and unspecified malignant neoplasm of intra-abdominal lymph nodes: Secondary | ICD-10-CM | POA: Diagnosis not present

## 2021-08-27 DIAGNOSIS — Z510 Encounter for antineoplastic radiation therapy: Secondary | ICD-10-CM | POA: Diagnosis not present

## 2021-08-27 DIAGNOSIS — C7951 Secondary malignant neoplasm of bone: Secondary | ICD-10-CM | POA: Diagnosis not present

## 2021-08-27 DIAGNOSIS — C787 Secondary malignant neoplasm of liver and intrahepatic bile duct: Secondary | ICD-10-CM | POA: Insufficient documentation

## 2021-08-27 LAB — GLUCOSE, CAPILLARY: Glucose-Capillary: 101 mg/dL — ABNORMAL HIGH (ref 70–99)

## 2021-08-27 MED ORDER — OXYCODONE HCL 10 MG PO TABS
10.0000 mg | ORAL_TABLET | ORAL | 0 refills | Status: DC | PRN
  Filled 2021-08-27: qty 60, 10d supply, fill #0

## 2021-08-27 MED ORDER — PREDNISONE 10 MG PO TABS
ORAL_TABLET | ORAL | 0 refills | Status: DC
  Filled 2021-08-27: qty 15, 9d supply, fill #0

## 2021-08-27 MED ORDER — FLUDEOXYGLUCOSE F - 18 (FDG) INJECTION
8.0000 | Freq: Once | INTRAVENOUS | Status: AC | PRN
  Administered 2021-08-27: 09:00:00 8.4 via INTRAVENOUS

## 2021-08-27 NOTE — Progress Notes (Signed)
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  Patient has had severe pain with scapular metastases and was started on XRT.  Had PET scan earlier today with pending results.  She presented for XRT today and was sent to [facility] for intractable right scapular pain.  Patient was previously taking Norco for pain but was prescribed Percocet 10-325mg  on 11/8 by Dr. Rogue Bussing #[id number]  Today, patient endorses 10 out of 10 bone pain in the right scapula site of known bony metastasis.  She is tearful as she reports the Percocet, which she is taking every 6 hours, is only helping slightly.  She says it reduces the pain to about 6 or 7 out of 10 but she does not yet find that tolerable.  She also has some drowsiness from the pain medication.  Patient denies other constitutional symptoms.  She denies any neurologic complaints such as numbness or weakness. Denies recent fevers or illnesses. Denies any easy bleeding or bruising. Reports good appetite and denies weight loss. Denies chest pain. Denies any nausea, vomiting, constipation, or diarrhea. Denies urinary complaints. Patient offers no further specific complaints today.  PAST MEDICAL HISTORY: Past Medical History:  Diagnosis Date   Cancer ([place])    lung cancer   Depression    Duodenitis    Dyspnea    Family history of cancer    High cholesterol    Personal history of colonic polyps     Pre-diabetes    Umbilical hernia    UTI (urinary tract infection)     PAST SURGICAL HISTORY:  Past Surgical History:  Procedure Laterality Date   COLONOSCOPY WITH PROPOFOL N/A 03/24/2021   Procedure: COLONOSCOPY WITH PROPOFOL;  Surgeon: Jonathon Bellows, MD;  Location: [facility] ENDOSCOPY;  Service: Gastroenterology;  Laterality: N/A;   INTERCOSTAL NERVE BLOCK  09/19/2020   Procedure: INTERCOSTAL NERVE BLOCK;  Surgeon: Melrose Nakayama, MD;  Location: [place];  Service: Thoracic;;   LUNG LOBECTOMY Right    LUNG REMOVAL, PARTIAL Right 09/19/2020   NODE DISSECTION Right 09/19/2020   Procedure: NODE DISSECTION;  Surgeon: Melrose Nakayama, MD;  Location: [place];  Service: Thoracic;  Laterality: Right;   VIDEO BRONCHOSCOPY N/A 07/08/2021   Procedure: VIDEO BRONCHOSCOPY;  Surgeon: Melrose Nakayama, MD;  Location: [place];  Service: Thoracic;  Laterality: N/A;    HEMATOLOGY/ONCOLOGY HISTORY:  Oncology History Overview Note  # NOV -[id number] [LUNG CANCER SCREENING PROGRAM]-18 mm right upper lobe lung nodule; [place] 2021- s/p right upper lobectomy [Dr. Roxan Hockey; [place]]; STAGE: I [pT-18 mm; LN-12=0]; predominant large cell neuroendocrine; minor adenocarcinoma.  Declines adjuvant chemotherapy.  # SURVIVORSHIP:   # GENETICS:   DIAGNOSIS: Lung cancer  STAGE: 1       ;  GOALS: Cure  CURRENT/MOST RECENT THERAPY : No adjuvant therapy    Total Number of Primary Tumors: 1  Procedure: Lung lobectomy  Specimen Laterality: Right  Tumor Focality: Unifocal  Tumor Site: Upper lobe  Tumor Size: 1.8 cm  Histologic Type: Combined  large cell neuroendocrine carcinoma with a  minor component of lung adenocarcinoma  Visceral Pleura Invasion: Not identified  Direct Invasion of Adjacent Structures: No adjacent structures present  Lymphovascular Invasion: Not identified  Margins: All margins negative for invasive carcinoma       Closest Margin(s) to Invasive Carcinoma: Bronchovascular margin  Treatment  Effect: No known presurgical therapy  Regional Lymph Nodes:       Number of Lymph Nodes Involved: 0                            Nodal Sites with Tumor: Not applicable       Number of Lymph Nodes Examined: 12    Primary cancer of right upper lobe of lung ([place])  11/03/2020 Initial Diagnosis   Primary cancer of right upper lobe of lung ([place])   11/03/2020 Cancer Staging   Staging form: Lung, AJCC 8th Edition - Pathologic: Stage IA3 (pT1c, pN0, cM0) - Signed by Cammie Sickle, MD on 11/04/2020      ALLERGIES:  has No Known Allergies.  MEDICATIONS:  Current Outpatient Medications  Medication Sig Dispense Refill   albuterol (VENTOLIN HFA) 108 (90 Base) MCG/ACT inhaler Inhale 2 puffs into the lungs every 6 (six) hours as needed for wheezing or shortness of breath. (Patient not taking: Reported on 08/21/2021) 6.7 g 2   buPROPion (WELLBUTRIN SR) 150 MG 12 hr tablet Take 1 tablet (150 mg total) by mouth 2 (two) times daily. 60 tablet 0   HYDROcodone-acetaminophen (NORCO/VICODIN) 5-325 MG tablet Take 1 tablet by mouth every 6 (six) hours as needed for moderate pain. 30 tablet 0   meloxicam (MOBIC) 7.5 MG tablet Take 1 tablet (7.5 mg total) by mouth daily. Start after stopping prednisone 14 tablet 0   metFORMIN (GLUCOPHAGE) 500 MG tablet TAKE ONE TABLET BY MOUTH EVERY MORNING WITH BREAKFAST 30 tablet 3   oxyCODONE-acetaminophen (PERCOCET) 10-325 MG tablet Take 1 tablet by mouth every 6 (six) hours as needed for pain. 30 tablet 0   rosuvastatin (CRESTOR) 10 MG tablet TAKE ONE TABLET BY MOUTH EVERY DAY 30 tablet 3   No current facility-administered medications for this visit.    VITAL SIGNS: There were no vitals taken for this visit. There were no vitals filed for this visit.  Estimated body mass index is 23.86 kg/m as calculated from the following:   Height as of 08/12/21: 5' 7.5" (1.715 m).   Weight as of 08/21/21: 154 lb 9.6 oz (70.1 kg).  LABS: CBC:    Component Value Date/Time    WBC 6.6 08/12/2021 1633   HGB 14.4 08/12/2021 1633   HGB 15.5 11/26/2020 1214   HCT 44.0 08/12/2021 1633   HCT 47.6 (H) 11/26/2020 1214   PLT 323 08/12/2021 1633   PLT 317 11/26/2020 1214   MCV 87.6 08/12/2021 1633   MCV 87 11/26/2020 1214   NEUTROABS 2.3 08/12/2021 1633   NEUTROABS 2.5 11/26/2020 1214   LYMPHSABS 3.7 08/12/2021 1633   LYMPHSABS 3.6 (H) 11/26/2020 1214   MONOABS 0.5 08/12/2021 1633   EOSABS 0.1 08/12/2021 1633   EOSABS 0.2 11/26/2020 1214   BASOSABS 0.1 08/12/2021 1633   BASOSABS 0.1 11/26/2020 1214   Comprehensive Metabolic Panel:    Component Value Date/Time   NA 136 08/12/2021 1750   NA 139 11/26/2020 1214   K 4.4 08/12/2021 1750   CL 105 08/12/2021 1750   CO2 27 08/12/2021 1750   BUN 15 08/12/2021  1750   BUN 8 11/26/2020 1214   CREATININE 0.71 08/12/2021 1750   GLUCOSE 99 08/12/2021 1750   CALCIUM 8.8 (L) 08/12/2021 1750   AST 17 08/12/2021 1750   ALT 10 08/12/2021 1750   ALKPHOS 74 08/12/2021 1750   BILITOT 0.9 08/12/2021 1750   BILITOT 0.3 11/26/2020 1214   PROT 7.0 08/12/2021 1750   PROT 6.9 11/26/2020 1214   ALBUMIN 3.6 08/12/2021 1750   ALBUMIN 4.0 11/26/2020 1214    RADIOGRAPHIC STUDIES: DG Chest 2 View  Result Date: 08/12/2021 CLINICAL DATA:  Right arm pain. EXAM: CHEST - 2 VIEW COMPARISON:  Chest x-ray 07/08/2021 FINDINGS: The cardiac silhouette, mediastinal and hilar contours are within normal limits and stable. Stable surgical and possible radiation changes in the right hilum. There also stable scarring changes in the right upper lobe. No pneumothorax or pleural effusion. No pulmonary lesions. There is a lytic lesion involving the right scapular body. This may have been present on the prior chest x-ray 928 but was much smaller and I do not see it all on the prior chest CT from 05/20/2021. IMPRESSION: 1. No acute cardiopulmonary findings. 2. Stable surgical and possible radiation changes in the right hilum. 3. Lytic lesion involving the  right scapular body. Electronically Signed   By: Marijo Sanes M.D.   On: 08/12/2021 16:26   CT CHEST W CONTRAST  Result Date: 08/13/2021 CLINICAL DATA:  Pretreatment staging for non-small cell lung cancer in a 60 year old female. EXAM: CT CHEST WITH CONTRAST TECHNIQUE: Multidetector CT imaging of the chest was performed during intravenous contrast administration. CONTRAST:  55mL OMNIPAQUE IOHEXOL 300 MG/ML  SOLN COMPARISON:  May 20, 2021. FINDINGS: Cardiovascular: Aortic atherosclerosis, coronary artery disease and postoperative alteration of RIGHT hilum as before. No substantial pericardial effusion. No signs of aortic aneurysm. Mediastinum/Nodes: No thoracic inlet adenopathy. No axillary adenopathy. No mediastinal adenopathy. Lungs/Pleura: Near complete collapse of RIGHT middle lobe following RIGHT upper lobectomy. This is similar to the prior study. No effusion. No consolidative process. Basilar scarring. Airways are patent. Upper Abdomen: Hepatic lesion measuring up to 4.6 x 3.8 cm (image 130/2). Hepatic subsegment II, LEFT hepatic lobe near the IVC confluence. Hepatic lesion in the LEFT hepatic lobe (image 138/2) 13 mm. Subtle low-density in the RIGHT hepatic lobe may also represent a metastatic lesion (image 175/2) 10 mm. Signs of mildly enlarged gastrohepatic lymph node (image 145/2) 12 mm. Stable benign appearing LEFT adrenal lesion, well-circumscribed and 2.6 cm. No acute gastrointestinal process to the extent evaluated. Musculoskeletal: Large destructive lesion in the RIGHT scapula destroying the lateral scapular body soft tissue measuring 3.6 x 3.2 cm (image 40/2) Subtle lytic changes in the posterior margin/posterior cortex of the sternal manubrium are similar and may be developmental measuring 2-3 mm, for example on image 36/2 and images just below this level. No additional suspicious bone abnormality. IMPRESSION: Large destructive mass centered in the body of the scapula along the lateral  margin with frank cortical destruction and enhancing soft tissue. Signs of hepatic metastatic disease largest lesion in hepatic subsegment II as described. Postoperative changes in the chest and associated volume loss in the RIGHT middle lobe with similar appearance to prior imaging. Potential nodal disease in the upper abdomen as well. These results will be called to the ordering clinician or representative by the Radiologist Assistant, and communication documented in the PACS or [employer]. Aortic Atherosclerosis (ICD10-I70.0) and Emphysema (ICD10-J43.9). Electronically Signed   By: Zetta Bills M.D.   On: 08/13/2021 16:33  DG Humerus Right  Addendum Date: 08/12/2021   ADDENDUM REPORT: 08/12/2021 16:27 ADDENDUM: After reviewing the chest x-ray I noticed a lesion in the right scapula. This is very evident on the lateral upper humerus film. This destructive bony lesion is new and likely accounts for the patient's pain. I do not see any other definite bone lesions. PET-CT may be helpful for further evaluation of other disease. Electronically Signed   By: Marijo Sanes M.D.   On: 08/12/2021 16:27   Result Date: 08/12/2021 CLINICAL DATA:  Right arm pain since Monday. EXAM: RIGHT HUMERUS - 2+ VIEW COMPARISON:  None. FINDINGS: Mild AC joint degenerative changes are noted. The glenohumeral joint is maintained. The elbow joint appears normal. The right humerus is intact. No fracture or bone lesions. IMPRESSION: 1. No acute bony findings. 2. Mild AC joint degenerative changes. Electronically Signed: By: Marijo Sanes M.D. On: 08/12/2021 16:19    PERFORMANCE STATUS (ECOG) : 1 - Symptomatic but completely ambulatory  Review of Systems Unless otherwise noted, a complete review of systems is negative.  Physical Exam General: NAD Cardiovascular: regular rate and rhythm Pulmonary: clear anterior/posterior fields Abdomen: soft, nontender, + bowel sounds GU: no suprapubic tenderness Extremities: no  edema, no joint deformities Skin: no rashes Neurological: Grossly nonfocal  Assessment and Plan- Patient is a 60 y.o. 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  Recommended liberalizing frequency of oxycodone to every 4 hours as needed.  Will also start patient on prednisone taper due to the inflammatory nature of pain with bone metastasis.  Patient is starting XRT next week, which hopefully should improve the pain and allow [place] to wean pain medications.  Adverse effects from pain medications discussed in detail.  Recommended as needed bowel regimen to prevent opioid-induced constipation.  Recommend against driving on pain medications.  Patient endorsed financial distress associated with covering the cost of her pain medications.  Referral sent for social work/palliative care.  Prescriptions today sent to [facility] outpatient pharmacy and patient given cost estimate after speaking with pharmacist.    Case and plan discussed with Dr. Rogue Bussing  Patient expressed understanding and was in agreement with this plan. She also understands that She can call clinic at any time with any questions, concerns, or complaints.   Thank you for allowing me to participate in the care of this very pleasant patient.   Time Total: 25 minutes  Visit consisted of counseling and education dealing with the complex and emotionally intense issues of symptom management in the setting of serious illness.Greater than 50%  of this time was spent counseling and coordinating care related to the above assessment and plan.  Signed by: Altha Harm, PhD, NP-C

## 2021-08-27 NOTE — Telephone Encounter (Signed)
Left message checking in about resources for food stamps, clothing and other financial assistance. [place] phone number for her to call back.

## 2021-08-27 NOTE — Progress Notes (Signed)
Pt presents to [facility] with uncontrolled pain to posterior right shoulder. She states that she has 2 or 3 tablets left of current pain medication, which she states causes drowsiness, but does not relieve the pain.

## 2021-08-31 ENCOUNTER — Encounter: Payer: Self-pay | Admitting: *Deleted

## 2021-08-31 ENCOUNTER — Other Ambulatory Visit: Payer: Self-pay

## 2021-08-31 ENCOUNTER — Encounter: Payer: Self-pay | Admitting: Internal Medicine

## 2021-08-31 ENCOUNTER — Ambulatory Visit
Admission: RE | Admit: 2021-08-31 | Discharge: 2021-08-31 | Disposition: A | Discharge status: Home / Self Care | Payer: Medicaid Other | Source: Ambulatory Visit | Attending: Radiation Oncology | Admitting: Radiation Oncology

## 2021-08-31 ENCOUNTER — Inpatient Hospital Stay (HOSPITAL_BASED_OUTPATIENT_CLINIC_OR_DEPARTMENT_OTHER): Payer: Medicaid Other | Admitting: Internal Medicine

## 2021-08-31 ENCOUNTER — Inpatient Hospital Stay (HOSPITAL_BASED_OUTPATIENT_CLINIC_OR_DEPARTMENT_OTHER): Payer: Medicaid Other | Admitting: Hospice and Palliative Medicine

## 2021-08-31 DIAGNOSIS — C3411 Malignant neoplasm of upper lobe, right bronchus or lung: Secondary | ICD-10-CM

## 2021-08-31 DIAGNOSIS — Z510 Encounter for antineoplastic radiation therapy: Secondary | ICD-10-CM | POA: Diagnosis not present

## 2021-08-31 MED ORDER — PROCHLORPERAZINE MALEATE 10 MG PO TABS
10.0000 mg | ORAL_TABLET | Freq: Four times a day (QID) | ORAL | 1 refills | Status: DC | PRN
  Filled 2021-08-31: qty 40, 10d supply, fill #0

## 2021-08-31 MED ORDER — LIDOCAINE-PRILOCAINE 2.5-2.5 % EX CREA
1.0000 "application " | TOPICAL_CREAM | CUTANEOUS | 3 refills | Status: DC | PRN
  Filled 2021-08-31: qty 30, 30d supply, fill #0
  Filled 2021-12-03: qty 30, 30d supply, fill #1

## 2021-08-31 MED ORDER — ONDANSETRON HCL 4 MG PO TABS
ORAL_TABLET | ORAL | 1 refills | Status: DC
  Filled 2021-08-31: qty 80, 14d supply, fill #0

## 2021-08-31 NOTE — Progress Notes (Signed)
START OFF PATHWAY REGIMEN - Other ° ° °[id number]:Atezolizumab D1 + Carboplatin D1 + Etoposide  IV D1-3 q21 Days x 4 Cycles Followed by Atezolizumab D1 q21 Days: °  Cycles 1 through 4, every 21 days: °    Atezolizumab  °    Carboplatin  °    Etoposide  °  Cycles 5 and beyond, every 21 days: °    Atezolizumab  ° °**Always confirm dose/schedule in your pharmacy ordering system** ° °Patient Characteristics: °Intent of Therapy: °Non-Curative / Palliative Intent, Discussed with Patient °

## 2021-08-31 NOTE — Progress Notes (Signed)
[facility] CONSULT NOTE  Patient Care Team: Langston Reusing, NP as PCP - General (Gerontology) Telford Nab, RN as Oncology Nurse Navigator  CHIEF COMPLAINTS/PURPOSE OF CONSULTATION: lung cancer  #  Oncology History Overview Note  # NOV -[id number] [facility] CANCER SCREENING PROGRAM]-18 mm right upper lobe lung nodule; DEC 2021- s/p right upper lobectomy [Dr. Roxan Hockey; [place]]; STAGE: I [pT-18 mm; LN-12=0]; predominant large cell neuroendocrine; minor adenocarcinoma.  Declines adjuvant chemotherapy.  #Recurrent/stage IV cancer NOV 2022- PET scan-scapular lesion liver lesion gastrohepatic lymphadenopathy.  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  Visceral Pleura Invasion: Not identified  Direct Invasion of Adjacent Structures: No adjacent structures present  Lymphovascular Invasion: Not identified  Margins: All margins negative for invasive carcinoma       Closest Margin(s) to Invasive Carcinoma: Bronchovascular margin  Treatment Effect: No known presurgical therapy  Regional Lymph Nodes:       Number of Lymph Nodes Involved: 0                            Nodal Sites with Tumor: Not applicable       Number of Lymph Nodes Examined: 12      Primary cancer of right upper lobe of lung ([place])  11/03/2020 Initial Diagnosis   Primary cancer of right upper lobe of lung ([place])   11/03/2020 Cancer Staging   Staging form: Lung, AJCC 8th Edition - Pathologic: Stage IA3 (pT1c, pN0, cM0) - Signed by Cammie Sickle, MD on 11/04/2020    08/31/2021 -  Chemotherapy   Patient is on Treatment Plan : LUNG  SCLC Carboplatin + Etoposide + Atezolizumab Induction q21d / Atezolizumab Maintenance q21d        HISTORY OF PRESENTING ILLNESS: Ambulating independently.  Accompanied by his sister Lattie Haw.  [place] Rommel 60 y.o.  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   In the interim patient was evaluated by symptom management clinic for worsening right posterior chest wall pain/shoulder pain. Continues to complain of ongoing shoulder pain.  However pain currently improved on narcotic pain medication not resolved.  Patient s/p evaluation with radiation oncology.  Plan to start radiation today.   Chronic shortness of breath/chronic mild cough no hemoptysis.   Review of Systems  Constitutional:  Negative for chills, diaphoresis, fever, malaise/fatigue and weight loss.  HENT:  Negative for nosebleeds and sore throat.   Eyes:  Negative for double vision.  Respiratory:  Negative for cough, hemoptysis, sputum production, shortness of breath and wheezing.   Cardiovascular:  Negative for chest pain, palpitations, orthopnea and leg swelling.  Gastrointestinal:  Negative for abdominal pain, blood in stool, constipation, diarrhea, heartburn, melena, nausea and vomiting.  Genitourinary:  Negative for dysuria, frequency and urgency.  Musculoskeletal:  Positive for back pain. Negative for joint pain.  Skin: Negative.  Negative for itching and rash.  Neurological:  Negative for dizziness, tingling, focal weakness, weakness and headaches.  Endo/Heme/Allergies:  Does not bruise/bleed easily.  Psychiatric/Behavioral:  Negative  for depression. The patient is not nervous/anxious and does not have insomnia.     MEDICAL HISTORY:  Past Medical History:  Diagnosis Date   Cancer [facility])    lung cancer   Depression    Duodenitis    Dyspnea    Family history of cancer    High cholesterol    Personal history of colonic  polyps    Pre-diabetes    Umbilical hernia    UTI (urinary tract infection)     SURGICAL HISTORY: Past Surgical History:  Procedure Laterality Date   COLONOSCOPY WITH PROPOFOL N/A 03/24/2021   Procedure: COLONOSCOPY WITH PROPOFOL;  Surgeon: Jonathon Bellows, MD;  Location: [facility] ENDOSCOPY;  Service: Gastroenterology;  Laterality: N/A;   INTERCOSTAL NERVE BLOCK  09/19/2020   Procedure: INTERCOSTAL NERVE BLOCK;  Surgeon: Melrose Nakayama, MD;  Location: [facility];  Service: Thoracic;;   LUNG LOBECTOMY Right    LUNG REMOVAL, PARTIAL Right 09/19/2020   NODE DISSECTION Right 09/19/2020   Procedure: NODE DISSECTION;  Surgeon: Melrose Nakayama, MD;  Location: [facility];  Service: Thoracic;  Laterality: Right;   VIDEO BRONCHOSCOPY N/A 07/08/2021   Procedure: VIDEO BRONCHOSCOPY;  Surgeon: Melrose Nakayama, MD;  Location: [facility] OR;  Service: Thoracic;  Laterality: N/A;    SOCIAL HISTORY: Social History   Socioeconomic History   Marital status: Single    Spouse name: Not on file   Number of children: Not on file   Years of education: Not on file   Highest education level: Not on file  Occupational History   Not on file  Tobacco Use   Smoking status: Some Days    Packs/day: 0.25    Years: 43.00    Pack years: 10.75    Types: Cigarettes   Smokeless tobacco: Never   Tobacco comments:    states she is slowing, trying to quit  Vaping Use   Vaping Use: Never used  Substance and Sexual Activity   Alcohol use: Yes    Alcohol/week: 2.0 standard drinks    Types: 2 Cans of beer per week    Comment: occasional   Drug use: No   Sexual activity: Not on file  Other Topics Concern   Not on file  Social History Narrative   Lives in [place]; smokes; now and then beer; with boy friend. 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    Social Determinants of Health   Financial Resource Strain: Not on file  Food Insecurity: Food Insecurity Present   Worried About [place] in  the Last Year: Sometimes true   Ran Out of Food in the Last Year: Sometimes true  Transportation Needs: No Transportation Needs   Lack of Transportation (Medical): No   Lack of Transportation (Non-Medical): No  Physical Activity: Insufficiently Active   Days of Exercise per Week: 7 days   Minutes of Exercise per Session: 20 min  Stress: Stress Concern Present   Feeling of Stress : To some extent  Social Connections: Socially Isolated   Frequency of Communication with Friends and Family: Never   Frequency of Social Gatherings with Friends and Family: Never   Attends Religious Services: Never   Marine scientist or Organizations: No   Attends Music therapist: Never   Marital Status: Never married  Human resources officer Violence: Not on file    FAMILY HISTORY: Family History  Problem Relation Age of Onset   Diabetes Mother    Cancer Mother    Diabetes Father  Stroke Father    Heart attack Father        20s   Healthy Sister    Cancer Brother    Hepatitis Brother    Diabetes Brother    Hypertension Brother    Heart disease Brother     ALLERGIES:  has No Known Allergies.  MEDICATIONS:  Current Outpatient Medications  Medication Sig Dispense Refill   buPROPion (WELLBUTRIN SR) 150 MG 12 hr tablet Take 1 tablet (150 mg total) by mouth 2 (two) times daily. 60 tablet 0   Lidocaine 4 % PTCH Place onto the skin.     lidocaine-prilocaine (EMLA) cream Apply 1 application topically as needed. 30 g 3   meloxicam (MOBIC) 7.5 MG tablet Take 1 tablet (7.5 mg total) by mouth daily. Start after stopping prednisone 14 tablet 0   metFORMIN (GLUCOPHAGE) 500 MG tablet TAKE ONE TABLET BY MOUTH EVERY MORNING WITH BREAKFAST 30 tablet 3   ondansetron (ZOFRAN) 8 MG tablet One pill every 8 hours as needed for nausea/vomitting. 40 tablet 1   Oxycodone HCl 10 MG TABS Take 1 tablet (10 mg total) by mouth every 4 (four) hours as needed (pain). 60 tablet 0   predniSONE (DELTASONE) 10 MG  tablet Take 2 tablets (522m) by mouth x 3 days, then take 1 tablet (182m x 3 days, then take 1/2 tablet (22m27mx 3 days, then stop 15 tablet 0   prochlorperazine (COMPAZINE) 10 MG tablet Take 1 tablet (10 mg total) by mouth every 6 (six) hours as needed for nausea or vomiting. 40 tablet 1   rosuvastatin (CRESTOR) 10 MG tablet TAKE ONE TABLET BY MOUTH EVERY DAY 30 tablet 3   albuterol (VENTOLIN HFA) 108 (90 Base) MCG/ACT inhaler Inhale 2 puffs into the lungs every 6 (six) hours as needed for wheezing or shortness of breath. (Patient not taking: Reported on 08/21/2021) 6.7 g 2   No current facility-administered medications for this visit.      .  Marland KitchenHYSICAL EXAMINATION: ECOG PERFORMANCE STATUS: 0 - Asymptomatic  Vitals:   08/31/21 0845  BP: 135/82  Pulse: 69  Temp: 97.9 F (36.6 C)  SpO2: 97%     Filed Weights   08/31/21 0845  Weight: 155 lb 3.2 oz (70.4 kg)      Physical Exam HENT:     Head: Normocephalic and atraumatic.     Mouth/Throat:     Pharynx: No oropharyngeal exudate.  Eyes:     Pupils: Pupils are equal, round, and reactive to light.  Cardiovascular:     Rate and Rhythm: Normal rate and regular rhythm.  Pulmonary:     Effort: Pulmonary effort is normal. No respiratory distress.     Breath sounds: Normal breath sounds. No wheezing.  Abdominal:     General: Bowel sounds are normal. There is no distension.     Palpations: Abdomen is soft. There is no mass.     Tenderness: There is no abdominal tenderness. There is no guarding or rebound.  Musculoskeletal:        General: No tenderness. Normal range of motion.     Cervical back: Normal range of motion and neck supple.  Skin:    General: Skin is warm.  Neurological:     Mental Status: She is alert and oriented to person, place, and time.  Psychiatric:        Mood and Affect: Affect normal.     LABORATORY DATA:  I have reviewed the data as listed Lab Results  Component Value Date   WBC 6.6 08/12/2021    HGB 14.4 08/12/2021   HCT 44.0 08/12/2021   MCV 87.6 08/12/2021   PLT 323 08/12/2021   Recent Labs    11/26/20 1214 05/20/21 1343 05/22/21 1500 08/12/21 1750  NA 139  --  139 136  K 4.1  --  4.1 4.4  CL 103  --  105 105  CO2 22  --  26 27  GLUCOSE 99  --  100* 99  BUN 8  --  14 15  CREATININE 0.69 0.50 0.72 0.71  CALCIUM 9.5  --  9.8 8.8*  GFRNONAA 96  --  >60 >60  GFRAA 110  --   --   --   PROT 6.9  --  8.0 7.0  ALBUMIN 4.0  --  3.7 3.6  AST 18  --  13* 17  ALT 9  --  9 10  ALKPHOS 113  --  91 74  BILITOT 0.3  --  0.4 0.9    RADIOGRAPHIC STUDIES: I have personally reviewed the radiological images as listed and agreed with the findings in the report. DG Chest 2 View  Result Date: 08/12/2021 CLINICAL DATA:  Right arm pain. EXAM: CHEST - 2 VIEW COMPARISON:  Chest x-ray 07/08/2021 FINDINGS: The cardiac silhouette, mediastinal and hilar contours are within normal limits and stable. Stable surgical and possible radiation changes in the right hilum. There also stable scarring changes in the right upper lobe. No pneumothorax or pleural effusion. No pulmonary lesions. There is a lytic lesion involving the right scapular body. This may have been present on the prior chest x-ray 928 but was much smaller and I do not see it all on the prior chest CT from 05/20/2021. IMPRESSION: 1. No acute cardiopulmonary findings. 2. Stable surgical and possible radiation changes in the right hilum. 3. Lytic lesion involving the right scapular body. Electronically Signed   By: Marijo Sanes M.D.   On: 08/12/2021 16:26   CT CHEST W CONTRAST  Result Date: 08/13/2021 CLINICAL DATA:  Pretreatment staging for non-small cell lung cancer in a 60 year old female. EXAM: CT CHEST WITH CONTRAST TECHNIQUE: Multidetector CT imaging of the chest was performed during intravenous contrast administration. CONTRAST:  56m OMNIPAQUE IOHEXOL 300 MG/ML  SOLN COMPARISON:  May 20, 2021. FINDINGS: Cardiovascular: Aortic  atherosclerosis, coronary artery disease and postoperative alteration of RIGHT hilum as before. No substantial pericardial effusion. No signs of aortic aneurysm. Mediastinum/Nodes: No thoracic inlet adenopathy. No axillary adenopathy. No mediastinal adenopathy. Lungs/Pleura: Near complete collapse of RIGHT middle lobe following RIGHT upper lobectomy. This is similar to the prior study. No effusion. No consolidative process. Basilar scarring. Airways are patent. Upper Abdomen: Hepatic lesion measuring up to 4.6 x 3.8 cm (image 130/2). Hepatic subsegment II, LEFT hepatic lobe near the IVC confluence. Hepatic lesion in the LEFT hepatic lobe (image 138/2) 13 mm. Subtle low-density in the RIGHT hepatic lobe may also represent a metastatic lesion (image 175/2) 10 mm. Signs of mildly enlarged gastrohepatic lymph node (image 145/2) 12 mm. Stable benign appearing LEFT adrenal lesion, well-circumscribed and 2.6 cm. No acute gastrointestinal process to the extent evaluated. Musculoskeletal: Large destructive lesion in the RIGHT scapula destroying the lateral scapular body soft tissue measuring 3.6 x 3.2 cm (image 40/2) Subtle lytic changes in the posterior margin/posterior cortex of the sternal manubrium are similar and may be developmental measuring 2-3 mm, for example on image 36/2 and images just below this level. No additional suspicious bone abnormality.  IMPRESSION: Large destructive mass centered in the body of the scapula along the lateral margin with frank cortical destruction and enhancing soft tissue. Signs of hepatic metastatic disease largest lesion in hepatic subsegment II as described. Postoperative changes in the chest and associated volume loss in the RIGHT middle lobe with similar appearance to prior imaging. Potential nodal disease in the upper abdomen as well. These results will be called to the ordering clinician or representative by the Radiologist Assistant, and communication documented in the PACS or  [employer]. Aortic Atherosclerosis (ICD10-I70.0) and Emphysema (ICD10-J43.9). Electronically Signed   By: Zetta Bills M.D.   On: 08/13/2021 16:33   NM PET Image Restage (PS) Skull Base to Thigh (F-18 FDG)  Result Date: 08/28/2021 CLINICAL DATA:  Subsequent treatment strategy for non-small cell lung cancer. RIGHT upper lobe lobectomy 09/19/2020 EXAM: NUCLEAR MEDICINE PET SKULL BASE TO THIGH TECHNIQUE: 8.4 mCi F-18 FDG was injected intravenously. Full-ring PET imaging was performed from the skull base to thigh after the radiotracer. CT data was obtained and used for attenuation correction and anatomic localization. Fasting blood glucose: 101 mg/dl COMPARISON:  PET-CT 08/20/2020 FINDINGS: Mediastinal blood pool activity: SUV max 2.6 Liver activity: SUV max NA NECK: No hypermetabolic lymph nodes in the neck. Incidental CT findings: none CHEST: Postsurgical change in the RIGHT hemithorax with RIGHT upper lobectomy and resection of the hypermetabolic nodule. No or enlarged nodules within the RIGHT lung. No LEFT lung lesion. No hypermetabolic mediastinal lymph nodes. Incidental CT findings: Coronary artery calcification and aortic atherosclerotic calcification. ABDOMEN/PELVIS: Within the posterior aspect of the LEFT hepatic lobe there is a new hypermetabolic lesion measuring 4.1 cm with SUV max equal 10.5 on image 158 of the fused data set. Small lesion in the anterior subcapsular LEFT hepatic lobe measures 1.5 cm with SUV max equal 5.7 on image 152. Benign adenoma of the LEFT adrenal gland. Single hypermetabolic gastrohepatic ligament node on image 149 with SUV max equal 6.6. No pelvic adenopathy. Incidental CT findings: Uterus and adnexa unremarkable. SKELETON: Within the RIGHT scapula there is a hypermetabolic expansile mass measuring 2.6 x 2.7 cm with SUV max equal 10.9. This mass erupted through the cortex into the musculature. No additional skeletal metastasis Incidental CT findings: none IMPRESSION: 1.  Unfortunately, there is evidence of multi organ lung cancer metastasis. 2. Two new hypermetabolic metastasis in LEFT hepatic lobe. 3. Single hypermetabolic gastrohepatic ligament lymph node. 4. 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 This large lesion erupts through the cortex into the adjacent musculature. These results will be called to the ordering clinician or representative by the Radiologist Assistant, and communication documented in the PACS or [employer]. Electronically Signed   By: Suzy Bouchard M.D.   On: 08/28/2021 10:19   DG Humerus Right  Addendum Date: 08/12/2021   ADDENDUM REPORT: 08/12/2021 16:27 ADDENDUM: After reviewing the chest x-ray I noticed a lesion in the right scapula. This is very evident on the lateral upper humerus film. This destructive bony lesion is new and likely accounts for the patient's pain. I do not see any other definite bone lesions. PET-CT may be helpful for further evaluation of other disease. Electronically Signed   By: Marijo Sanes M.D.   On: 08/12/2021 16:27   Result Date: 08/12/2021 CLINICAL DATA:  Right arm pain since Monday. EXAM: RIGHT HUMERUS - 2+ VIEW COMPARISON:  None. FINDINGS: Mild AC joint degenerative changes are noted. The glenohumeral joint is maintained. The elbow joint appears normal. The right humerus is intact. No  fracture or bone lesions. IMPRESSION: 1. No acute bony findings. 2. Mild AC joint degenerative changes. Electronically Signed: By: Marijo Sanes M.D. 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 PET 08/28/2021-metastatic disease noted in the liver/left hepatic lobe; hypermetabolic gastrohepatic lymph node; and also left scapular metastases with destruction of the bone [see below].  NGS negative for any targeted options at  this time; PDL-1=0.  #I reviewed the stage-4 incurable cancer.  Discussed regarding palliative chemotherapy/immunotherapy. Discussed chemo immunotherapy carbo etoposide-Tecentriq every 3 weeks x 4 cycles.  Response would be evaluated with interim imaging.  We will also recommend maintenance Tecentriq post chemotherapy.  I discussed the schedule in detail.  Discussed treatments are palliative not curative ~discussed the median survival is in the range of around 12 months.   Discussed the potential side effects including but not limited to-increasing fatigue, nausea vomiting, diarrhea, hair loss, sores in the mouth, increase risk of infection and also neuropathy.    I discussed the mechanism of action; The goal of therapy is palliative; and length of treatments are likely ongoing/based upon the results of the scans. Discussed the potential side effects of immunotherapy including but not limited to diarrhea; skin rash; elevated LFTs/endocrine abnormalities etc.  # Chemotherapy education; port placement. Antiemetics-Zofran and Compazine; EMLA cream sent to pharmacy.   # pain control/scapular metastases right-s/p evaluation with Dr. Donella Stade- start RT 11/21 to 12/06.Continue Oxy 10 mg 1 pill every 6 hours; and also on prednisone 10 mg/day. Consider Zometa.   #IV access: Recommend port placement.   # DISPOSITION: # chemo ed ASAP # port referral to IR # follow up on dec 5th- MD; labs- cbc/cmp; Carboetop-Atezo; d-2 &3- etop- dr.B  # I reviewed the blood work- with the patient in detail; also reviewed the imaging independently [as summarized above]; and with the patient in detail.   .    All questions were answered. The patient knows to call the clinic with any problems, questions or concerns.    Cammie Sickle, MD 08/31/2021 10:37 AM

## 2021-08-31 NOTE — Progress Notes (Signed)
Met with patient during follow up visit with Dr. Rogue Bussing to review treatment options. All questions answered during visit. Pt asked for referral to social worker to discuss disability questions. Referral previously placed by Josh at visit last week. Pt informed that social worker will contact her to further discuss. Reviewed upcoming appts for chemo class and follow up to start chemo. Reassured pt that has time to think about her options and does not need to make a decision today. Encouraged pt to attend chemo class to help make decision about pursuing treatment. Pt verbalized understanding. Nothing further needed at this time.

## 2021-08-31 NOTE — Progress Notes (Signed)
Patient here for follow up. No new concerns voiced.  °

## 2021-08-31 NOTE — Progress Notes (Signed)
[place]  [phone] [phone]   Name: Stacie Kennedy Date: 08/31/2021 MRN: [id number]  DOB: 09/22/1961  Patient Care Team: Langston Reusing, NP as PCP - General (Gerontology) Telford Nab, RN as Oncology Nurse Navigator    REASON FOR CONSULTATION: Stacie Kennedy is a 60 y.o. 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  Patient has had severe pain with scapular metastases and was started on XRT.  She is referred to palliative care to help address goals and manage ongoing symptoms.   SOCIAL HISTORY:     reports that she has been smoking cigarettes. She has a 10.75 pack-year smoking history. She has never used smokeless tobacco. She reports current alcohol use of about 2.0 standard drinks per week. She reports that she does not use drugs.  ADVANCE DIRECTIVES:  Does not have  CODE STATUS:   PAST MEDICAL HISTORY: Past Medical History:  Diagnosis Date   Cancer ([place])    lung cancer   Depression    Duodenitis    Dyspnea    Family history of cancer    High cholesterol    Personal history of colonic polyps    Pre-diabetes    Umbilical hernia    UTI (urinary tract infection)     PAST SURGICAL HISTORY:  Past Surgical History:  Procedure Laterality Date   COLONOSCOPY WITH PROPOFOL N/A 03/24/2021   Procedure: COLONOSCOPY WITH PROPOFOL;  Surgeon: Jonathon Bellows, MD;  [facility] ENDOSCOPY;  Service: Gastroenterology;  Laterality: N/A;   INTERCOSTAL NERVE BLOCK  09/19/2020   Procedure: INTERCOSTAL NERVE BLOCK;  Surgeon: Melrose Nakayama, MD;  Location: [place];  Service: Thoracic;;   LUNG LOBECTOMY Right    LUNG REMOVAL, PARTIAL Right 09/19/2020   NODE DISSECTION Right 09/19/2020   Procedure: NODE  DISSECTION;  Surgeon: Melrose Nakayama, MD;  Location: [place];  Service: Thoracic;  Laterality: Right;   VIDEO BRONCHOSCOPY N/A 07/08/2021   Procedure: VIDEO BRONCHOSCOPY;  Surgeon: Melrose Nakayama, MD;  Location: [place];  Service: Thoracic;  Laterality: N/A;    HEMATOLOGY/ONCOLOGY HISTORY:  Oncology History Overview Note  # NOV -[id number] [LUNG CANCER SCREENING PROGRAM]-18 mm right upper lobe lung nodule; [place] 2021- s/p right upper lobectomy [Dr. Roxan Hockey; [place]]; STAGE: I [pT-18 mm; LN-12=0]; predominant large cell neuroendocrine; minor adenocarcinoma.  Declines adjuvant chemotherapy.  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  Visceral Pleura Invasion: Not identified  Direct Invasion of Adjacent Structures: No adjacent structures present  Lymphovascular Invasion: Not identified  Margins: All margins negative for invasive carcinoma       Closest Margin(s) to Invasive Carcinoma: Bronchovascular margin  Treatment Effect: No known presurgical therapy  Regional Lymph Nodes:       Number of Lymph Nodes Involved: 0  Nodal Sites with Tumor: Not applicable       Number of Lymph Nodes Examined: 12    Primary cancer of right upper lobe of lung ([place])  11/03/2020 Initial Diagnosis   Primary cancer of right upper lobe of lung ([place])   11/03/2020 Cancer Staging   Staging form: Lung, AJCC 8th Edition - Pathologic: Stage IA3 (pT1c, pN0, cM0) - Signed by Cammie Sickle, MD on 11/04/2020      ALLERGIES:  has No Known Allergies.  MEDICATIONS:  Current Outpatient Medications   Medication Sig Dispense Refill   albuterol (VENTOLIN HFA) 108 (90 Base) MCG/ACT inhaler Inhale 2 puffs into the lungs every 6 (six) hours as needed for wheezing or shortness of breath. (Patient not taking: Reported on 08/21/2021) 6.7 g 2   buPROPion (WELLBUTRIN SR) 150 MG 12 hr tablet Take 1 tablet (150 mg total) by mouth 2 (two) times daily. 60 tablet 0   Lidocaine 4 % PTCH Place onto the skin.     meloxicam (MOBIC) 7.5 MG tablet Take 1 tablet (7.5 mg total) by mouth daily. Start after stopping prednisone 14 tablet 0   metFORMIN (GLUCOPHAGE) 500 MG tablet TAKE ONE TABLET BY MOUTH EVERY MORNING WITH BREAKFAST 30 tablet 3   Oxycodone HCl 10 MG TABS Take 1 tablet (10 mg total) by mouth every 4 (four) hours as needed (pain). 60 tablet 0   predniSONE (DELTASONE) 10 MG tablet Take 2 tablets (20mg ) by mouth x 3 days, then take 1 tablet (10mg ) x 3 days, then take 1/2 tablet (5mg ) x 3 days, then stop 15 tablet 0   rosuvastatin (CRESTOR) 10 MG tablet TAKE ONE TABLET BY MOUTH EVERY DAY 30 tablet 3   No current facility-administered medications for this visit.    VITAL SIGNS: There were no vitals taken for this visit. There were no vitals filed for this visit.  Estimated body mass index is 23.95 kg/m as calculated from the following:   Height as of 08/12/21: 5' 7.5" (1.715 m).   Weight as of an earlier encounter on 08/31/21: 155 lb 3.2 oz (70.4 kg).  LABS: CBC:    Component Value Date/Time   WBC 6.6 08/12/2021 1633   HGB 14.4 08/12/2021 1633   HGB 15.5 11/26/2020 1214   HCT 44.0 08/12/2021 1633   HCT 47.6 (H) 11/26/2020 1214   PLT 323 08/12/2021 1633   PLT 317 11/26/2020 1214   MCV 87.6 08/12/2021 1633   MCV 87 11/26/2020 1214   NEUTROABS 2.3 08/12/2021 1633   NEUTROABS 2.5 11/26/2020 1214   LYMPHSABS 3.7 08/12/2021 1633   LYMPHSABS 3.6 (H) 11/26/2020 1214   MONOABS 0.5 08/12/2021 1633   EOSABS 0.1 08/12/2021 1633   EOSABS 0.2 11/26/2020 1214   BASOSABS 0.1 08/12/2021 1633   BASOSABS  0.1 11/26/2020 1214   Comprehensive Metabolic Panel:    Component Value Date/Time   NA 136 08/12/2021 1750   NA 139 11/26/2020 1214   K 4.4 08/12/2021 1750   CL 105 08/12/2021 1750   CO2 27 08/12/2021 1750   BUN 15 08/12/2021 1750   BUN 8 11/26/2020 1214   CREATININE 0.71 08/12/2021 1750   GLUCOSE 99 08/12/2021 1750   CALCIUM 8.8 (L) 08/12/2021 1750   AST 17 08/12/2021 1750   ALT 10 08/12/2021 1750   ALKPHOS 74 08/12/2021 1750   BILITOT 0.9 08/12/2021 1750   BILITOT 0.3 11/26/2020 1214   PROT 7.0 08/12/2021 1750   PROT 6.9 11/26/2020 1214   ALBUMIN 3.6 08/12/2021  1750   ALBUMIN 4.0 11/26/2020 1214    RADIOGRAPHIC STUDIES: DG Chest 2 View  Result Date: 08/12/2021 CLINICAL DATA:  Right arm pain. EXAM: CHEST - 2 VIEW COMPARISON:  Chest x-ray 07/08/2021 FINDINGS: The cardiac silhouette, mediastinal and hilar contours are within normal limits and stable. Stable surgical and possible radiation changes in the right hilum. There also stable scarring changes in the right upper lobe. No pneumothorax or pleural effusion. No pulmonary lesions. There is a lytic lesion involving the right scapular body. This may have been present on the prior chest x-ray 928 but was much smaller and I do not see it all on the prior chest CT from 05/20/2021. IMPRESSION: 1. No acute cardiopulmonary findings. 2. Stable surgical and possible radiation changes in the right hilum. 3. Lytic lesion involving the right scapular body. Electronically Signed   By: Marijo Sanes M.D.   On: 08/12/2021 16:26   CT CHEST W CONTRAST  Result Date: 08/13/2021 CLINICAL DATA:  Pretreatment staging for non-small cell lung cancer in a 60 year old female. EXAM: CT CHEST WITH CONTRAST TECHNIQUE: Multidetector CT imaging of the chest was performed during intravenous contrast administration. CONTRAST:  68mL OMNIPAQUE IOHEXOL 300 MG/ML  SOLN COMPARISON:  May 20, 2021. FINDINGS: Cardiovascular: Aortic atherosclerosis, coronary artery  disease and postoperative alteration of RIGHT hilum as before. No substantial pericardial effusion. No signs of aortic aneurysm. Mediastinum/Nodes: No thoracic inlet adenopathy. No axillary adenopathy. No mediastinal adenopathy. Lungs/Pleura: Near complete collapse of RIGHT middle lobe following RIGHT upper lobectomy. This is similar to the prior study. No effusion. No consolidative process. Basilar scarring. Airways are patent. Upper Abdomen: Hepatic lesion measuring up to 4.6 x 3.8 cm (image 130/2). Hepatic subsegment II, LEFT hepatic lobe near the IVC confluence. Hepatic lesion in the LEFT hepatic lobe (image 138/2) 13 mm. Subtle low-density in the RIGHT hepatic lobe may also represent a metastatic lesion (image 175/2) 10 mm. Signs of mildly enlarged gastrohepatic lymph node (image 145/2) 12 mm. Stable benign appearing LEFT adrenal lesion, well-circumscribed and 2.6 cm. No acute gastrointestinal process to the extent evaluated. Musculoskeletal: Large destructive lesion in the RIGHT scapula destroying the lateral scapular body soft tissue measuring 3.6 x 3.2 cm (image 40/2) Subtle lytic changes in the posterior margin/posterior cortex of the sternal manubrium are similar and may be developmental measuring 2-3 mm, for example on image 36/2 and images just below this level. No additional suspicious bone abnormality. IMPRESSION: Large destructive mass centered in the body of the scapula along the lateral margin with frank cortical destruction and enhancing soft tissue. Signs of hepatic metastatic disease largest lesion in hepatic subsegment II as described. Postoperative changes in the chest and associated volume loss in the RIGHT middle lobe with similar appearance to prior imaging. Potential nodal disease in the upper abdomen as well. These results will be called to the ordering clinician or representative by the Radiologist Assistant, and communication documented in the PACS or [employer]. Aortic  Atherosclerosis (ICD10-I70.0) and Emphysema (ICD10-J43.9). Electronically Signed   By: Zetta Bills M.D.   On: 08/13/2021 16:33   NM PET Image Restage (PS) Skull Base to Thigh (F-18 FDG)  Result Date: 08/28/2021 CLINICAL DATA:  Subsequent treatment strategy for non-small cell lung cancer. RIGHT upper lobe lobectomy 09/19/2020 EXAM: NUCLEAR MEDICINE PET SKULL BASE TO THIGH TECHNIQUE: 8.4 mCi F-18 FDG was injected intravenously. Full-ring PET imaging was performed from the skull base to thigh after the radiotracer. CT data was obtained and used for attenuation correction and anatomic  localization. Fasting blood glucose: 101 mg/dl COMPARISON:  PET-CT 08/20/2020 FINDINGS: Mediastinal blood pool activity: SUV max 2.6 Liver activity: SUV max NA NECK: No hypermetabolic lymph nodes in the neck. Incidental CT findings: none CHEST: Postsurgical change in the RIGHT hemithorax with RIGHT upper lobectomy and resection of the hypermetabolic nodule. No or enlarged nodules within the RIGHT lung. No LEFT lung lesion. No hypermetabolic mediastinal lymph nodes. Incidental CT findings: Coronary artery calcification and aortic atherosclerotic calcification. ABDOMEN/PELVIS: Within the posterior aspect of the LEFT hepatic lobe there is a new hypermetabolic lesion measuring 4.1 cm with SUV max equal 10.5 on image 158 of the fused data set. Small lesion in the anterior subcapsular LEFT hepatic lobe measures 1.5 cm with SUV max equal 5.7 on image 152. Benign adenoma of the LEFT adrenal gland. Single hypermetabolic gastrohepatic ligament node on image 149 with SUV max equal 6.6. No pelvic adenopathy. Incidental CT findings: Uterus and adnexa unremarkable. SKELETON: Within the RIGHT scapula there is a hypermetabolic expansile mass measuring 2.6 x 2.7 cm with SUV max equal 10.9. This mass erupted through the cortex into the musculature. No additional skeletal metastasis Incidental CT findings: none IMPRESSION: 1. Unfortunately, there is  evidence of multi organ lung cancer metastasis. 2. Two new hypermetabolic metastasis in LEFT hepatic lobe. 3. Single hypermetabolic gastrohepatic ligament lymph node. 4. 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 This large lesion erupts through the cortex into the adjacent musculature. These results will be called to the ordering clinician or representative by the Radiologist Assistant, and communication documented in the PACS or [employer]. Electronically Signed   By: Suzy Bouchard M.D.   On: 08/28/2021 10:19   DG Humerus Right  Addendum Date: 08/12/2021   ADDENDUM REPORT: 08/12/2021 16:27 ADDENDUM: After reviewing the chest x-ray I noticed a lesion in the right scapula. This is very evident on the lateral upper humerus film. This destructive bony lesion is new and likely accounts for the patient's pain. I do not see any other definite bone lesions. PET-CT may be helpful for further evaluation of other disease. Electronically Signed   By: Marijo Sanes M.D.   On: 08/12/2021 16:27   Result Date: 08/12/2021 CLINICAL DATA:  Right arm pain since Monday. EXAM: RIGHT HUMERUS - 2+ VIEW COMPARISON:  None. FINDINGS: Mild AC joint degenerative changes are noted. The glenohumeral joint is maintained. The elbow joint appears normal. The right humerus is intact. No fracture or bone lesions. IMPRESSION: 1. No acute bony findings. 2. Mild AC joint degenerative changes. Electronically Signed: By: Marijo Sanes M.D. On: 08/12/2021 16:19    PERFORMANCE STATUS (ECOG) : 1 - Symptomatic but completely ambulatory  Review of Systems Unless otherwise noted, a complete review of systems is negative.  Physical Exam General: NAD Pulmonary: Unlabored Extremities: no edema, no joint deformities Skin: no rashes Neurological: Weakness but otherwise nonfocal  IMPRESSION: I saw patient last week in the context of an [facility] visit.  Today, introduced the concept of palliative care.  Patient was accompanied by her  sister.  Today, patient reports that her pain is improved.  She currently rates pain as 5 out of 10, which she finds "tolerable."  She denies any adverse effects after liberalizing oxycodone to every 4 hours.  She has continued on prednisone taper.  Patient starts XRT today and hopefully will have improvement in pain allowing tapering of pain medications.  Patient denies any other distressing symptoms today.  Last bowel movement was on Saturday.  Again discussed the importance  in utilizing a bowel regimen with a target bowel movement every day or every other day.  Patient reports that her appetite is adequate.  We will proceed with referral to nutrition with plan to start her on chemotherapy.  Patient will benefit from future conversation regarding advance care planning.  PLAN: -Continue current scope of treatment -Continue oxycodone as needed -Complete prednisone taper -Nutrition referral -Future ACP conversation -RTC 2 to 3 weeks   Patient expressed understanding and was in agreement with this plan. She also understands that She can call the clinic at any time with any questions, concerns, or complaints.     Time Total: 15 minutes  Visit consisted of counseling and education dealing with the complex and emotionally intense issues of symptom management and palliative care in the setting of serious and potentially life-threatening illness.Greater than 50%  of this time was spent counseling and coordinating care related to the above assessment and plan.  Signed by: Altha Harm, PhD, NP-C

## 2021-08-31 NOTE — Assessment & Plan Note (Addendum)
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 PET 08/28/2021-metastatic disease noted in the liver/left hepatic lobe; hypermetabolic gastrohepatic lymph node; and also left scapular metastases with destruction of the bone [see below].  NGS negative for any targeted options at this time; PDL-1=0.  #I reviewed the stage-4 incurable cancer.  Discussed regarding palliative chemotherapy/immunotherapy. Discussed chemo immunotherapy carbo etoposide-Tecentriq every 3 weeks x 4 cycles.  Response would be evaluated with interim imaging.  We will also recommend maintenance Tecentriq post chemotherapy.  I discussed the schedule in detail.  Discussed treatments are palliative not curative ~discussed the median survival is in the range of around 12 months.   Discussed the potential side effects including but not limited to-increasing fatigue, nausea vomiting, diarrhea, hair loss, sores in the mouth, increase risk of infection and also neuropathy.    I discussed the mechanism of action; The goal of therapy is palliative; and length of treatments are likely ongoing/based upon the results of the scans. Discussed the potential side effects of immunotherapy including but not limited to diarrhea; skin rash; elevated LFTs/endocrine abnormalities etc.  # Chemotherapy education; port placement. Antiemetics-Zofran and Compazine; EMLA cream sent to pharmacy.   # pain control/scapular metastases right-s/p evaluation with Dr. Donella Stade- start RT 11/21 to 12/06.Continue Oxy 10 mg 1 pill every 6 hours; and also on prednisone 10 mg/day. Consider Zometa.   #IV access: Recommend port placement.   # DISPOSITION: # chemo ed ASAP # port referral to IR # follow up on dec 5th- MD; labs- cbc/cmp; Carboetop-Atezo; d-2 &3- etop- dr.B  # I reviewed the blood work- with the patient in detail; also reviewed the imaging independently [as  summarized above]; and with the patient in detail.   Marland Kitchen

## 2021-09-01 ENCOUNTER — Encounter: Payer: Self-pay | Admitting: Internal Medicine

## 2021-09-01 ENCOUNTER — Other Ambulatory Visit: Payer: Self-pay

## 2021-09-01 ENCOUNTER — Ambulatory Visit
Admission: RE | Admit: 2021-09-01 | Discharge: 2021-09-01 | Disposition: A | Discharge status: Home / Self Care | Payer: Medicaid Other | Source: Ambulatory Visit | Attending: Radiation Oncology | Admitting: Radiation Oncology

## 2021-09-01 ENCOUNTER — Other Ambulatory Visit: Payer: Self-pay | Admitting: Hospice and Palliative Medicine

## 2021-09-01 ENCOUNTER — Encounter: Payer: Self-pay | Admitting: *Deleted

## 2021-09-01 DIAGNOSIS — Z510 Encounter for antineoplastic radiation therapy: Secondary | ICD-10-CM | POA: Diagnosis not present

## 2021-09-01 MED ORDER — OXYCODONE HCL 10 MG PO TABS
10.0000 mg | ORAL_TABLET | ORAL | 0 refills | Status: DC | PRN
  Filled 2021-09-01 – 2021-09-04 (×2): qty 60, 10d supply, fill #0

## 2021-09-01 MED ORDER — OXYCODONE HCL 10 MG PO TABS: 10.0000 mg | ORAL_TABLET | ORAL | 0 refills | Status: DC | PRN

## 2021-09-01 NOTE — Progress Notes (Signed)
Refill for oxycodone sent to pharmacy

## 2021-09-01 NOTE — Progress Notes (Signed)
Clinical Social Work received referral from medical oncology team. CSW made second attempt to contact patient. Left voicemail to return call.  Maryjean Morn, MSW, LCSW, OSW-C Clinical Social Worker [facility] [phone]

## 2021-09-02 ENCOUNTER — Telehealth: Payer: Self-pay | Admitting: Nurse Practitioner

## 2021-09-02 ENCOUNTER — Ambulatory Visit
Admission: RE | Admit: 2021-09-02 | Discharge: 2021-09-02 | Disposition: A | Discharge status: Home / Self Care | Payer: Medicaid Other | Source: Ambulatory Visit | Attending: Radiation Oncology | Admitting: Radiation Oncology

## 2021-09-02 ENCOUNTER — Ambulatory Visit: Payer: Self-pay | Admitting: Licensed Clinical Social Worker

## 2021-09-02 ENCOUNTER — Other Ambulatory Visit: Payer: Self-pay

## 2021-09-02 DIAGNOSIS — Z510 Encounter for antineoplastic radiation therapy: Secondary | ICD-10-CM | POA: Diagnosis not present

## 2021-09-02 NOTE — Telephone Encounter (Signed)
Attempted to contact patient to offer to schedule a Palliative Consult, no answer - left message with reason for call along with my name and call back number.

## 2021-09-04 ENCOUNTER — Encounter: Payer: Self-pay | Admitting: Internal Medicine

## 2021-09-04 ENCOUNTER — Other Ambulatory Visit: Payer: Self-pay

## 2021-09-07 ENCOUNTER — Inpatient Hospital Stay: Payer: Medicaid Other

## 2021-09-07 ENCOUNTER — Ambulatory Visit
Admission: RE | Admit: 2021-09-07 | Discharge: 2021-09-07 | Disposition: A | Discharge status: Home / Self Care | Payer: Medicaid Other | Source: Ambulatory Visit | Attending: Radiation Oncology | Admitting: Radiation Oncology

## 2021-09-07 DIAGNOSIS — Z510 Encounter for antineoplastic radiation therapy: Secondary | ICD-10-CM | POA: Diagnosis not present

## 2021-09-07 NOTE — Progress Notes (Signed)
Pharmacist Chemotherapy Monitoring - Initial Assessment    Anticipated start date: 09/14/21   The following has been reviewed per standard work regarding the patient's treatment regimen: The patient's diagnosis, treatment plan and drug doses, and organ/hematologic function Lab orders and baseline tests specific to treatment regimen  The treatment plan start date, drug sequencing, and pre-medications Prior authorization status  Patient's documented medication list, including drug-drug interaction screen and prescriptions for anti-emetics and supportive care specific to the treatment regimen The drug concentrations, fluid compatibility, administration routes, and timing of the medications to be used The patient's access for treatment and lifetime cumulative dose history, if applicable  The patient's medication allergies and previous infusion related reactions, if applicable   Changes made to treatment plan:  treatment plan date  Follow up needed:  dose adjustment of carbo due to dosing missing and signing treatment plan   Stacie Kennedy, [place], 09/07/2021  2:58 PM

## 2021-09-08 ENCOUNTER — Ambulatory Visit
Admission: RE | Admit: 2021-09-08 | Discharge: 2021-09-08 | Disposition: A | Discharge status: Home / Self Care | Payer: Medicaid Other | Source: Ambulatory Visit | Attending: Radiation Oncology | Admitting: Radiation Oncology

## 2021-09-08 ENCOUNTER — Inpatient Hospital Stay: Payer: Medicaid Other

## 2021-09-08 ENCOUNTER — Encounter: Payer: Self-pay | Admitting: *Deleted

## 2021-09-08 ENCOUNTER — Telehealth: Payer: Self-pay

## 2021-09-08 DIAGNOSIS — Z510 Encounter for antineoplastic radiation therapy: Secondary | ICD-10-CM | POA: Diagnosis not present

## 2021-09-08 NOTE — Progress Notes (Signed)
Met with patient after radiation treatment to question if wanted to reschedule missed chemo education. Pt stated she would like to reschedule and come Thursday morning. Spoke with patient briefly regarding port placement prior to treatment and pt decided she wants to try treatment with peripheral IV at first then get port later if wishes. Pt stated that her pain is not well controlled after completing steroid taper. Informed pt that will speak with Josh about her pain to see if there are other options for pain relief. Reviewed upcoming appts with patient and encouraged to call with any questions or needs. Informed pt that will let her know recommendations from Josh regarding her pain. Pt verbalized understanding. 

## 2021-09-08 NOTE — Telephone Encounter (Signed)
Scheduled palliative care consult with patient for 12/22 at 1 pm. Asked SW to call patient per patient request

## 2021-09-08 NOTE — Progress Notes (Signed)
Nutrition Assessment   Reason for Assessment:   Referral from [place], NP   ASSESSMENT: 60 year old female with neuroendocrine lung cancer s/p lobectomy declined adjuvant treatment.  Patient with recurrent disease. Currently receiving radiation treatment and patient undecided regarding chemotherapy.   Met with patient after chemotherapy.  Was meeting with nurse navigator prior to RD coming in room and patient tearful.  Patient continued to be tearful during visit.  Patient reports that she eats and has a good appetite.  Patient reports that she has access to food.  Friend/family help her with meal preparation.  Has a protein powder that she mixes with water.   Noted home based palliative care planning to meet with patient  Medications: reviewed   Labs: reviewed   Anthropometrics:   Height: 67.5 inches Weight: 155 lb 3.2 oz on 11/21 167 lb on 11/4 (?? Accuracy) 9/28 149 lb  BMI: 23  7% weight loss in the last month, significant   NUTRITION DIAGNOSIS: Inadequate oral intake related to cancer as evidenced by 7% weight loss in the last month    INTERVENTION:  Discussed high calorie, high protein diet. Handout provided.   Samples of glucerna shake provided with coupons.  Encouraged 1-2 per day. Contact information provided   MONITORING, EVALUATION, GOAL: weight trends, intake   Next Visit: phone visit 12/20  Zendaya Groseclose B. [name], [place], [place] Registered Dietitian [phone] (mobile)

## 2021-09-09 ENCOUNTER — Other Ambulatory Visit: Payer: Self-pay | Admitting: *Deleted

## 2021-09-09 ENCOUNTER — Ambulatory Visit
Admission: RE | Admit: 2021-09-09 | Discharge: 2021-09-09 | Disposition: A | Discharge status: Home / Self Care | Payer: Medicaid Other | Source: Ambulatory Visit | Attending: Radiation Oncology | Admitting: Radiation Oncology

## 2021-09-09 ENCOUNTER — Inpatient Hospital Stay: Payer: Medicaid Other | Admitting: Hospice and Palliative Medicine

## 2021-09-09 DIAGNOSIS — C3411 Malignant neoplasm of upper lobe, right bronchus or lung: Secondary | ICD-10-CM

## 2021-09-09 DIAGNOSIS — Z510 Encounter for antineoplastic radiation therapy: Secondary | ICD-10-CM | POA: Diagnosis not present

## 2021-09-10 ENCOUNTER — Other Ambulatory Visit: Payer: Self-pay

## 2021-09-10 ENCOUNTER — Encounter: Payer: Self-pay | Admitting: *Deleted

## 2021-09-10 ENCOUNTER — Inpatient Hospital Stay: Payer: Medicaid Other

## 2021-09-10 ENCOUNTER — Ambulatory Visit
Admission: RE | Admit: 2021-09-10 | Discharge: 2021-09-10 | Disposition: A | Discharge status: Home / Self Care | Payer: Medicaid Other | Source: Ambulatory Visit | Attending: Radiation Oncology | Admitting: Radiation Oncology

## 2021-09-10 ENCOUNTER — Encounter: Payer: Self-pay | Admitting: Internal Medicine

## 2021-09-10 ENCOUNTER — Inpatient Hospital Stay (HOSPITAL_BASED_OUTPATIENT_CLINIC_OR_DEPARTMENT_OTHER): Payer: Medicaid Other | Admitting: Hospice and Palliative Medicine

## 2021-09-10 VITALS — BP 146/67 | HR 70 | Temp 97.5°F

## 2021-09-10 DIAGNOSIS — C787 Secondary malignant neoplasm of liver and intrahepatic bile duct: Secondary | ICD-10-CM | POA: Insufficient documentation

## 2021-09-10 DIAGNOSIS — C3411 Malignant neoplasm of upper lobe, right bronchus or lung: Secondary | ICD-10-CM | POA: Diagnosis not present

## 2021-09-10 DIAGNOSIS — C7A1 Malignant poorly differentiated neuroendocrine tumors: Secondary | ICD-10-CM | POA: Insufficient documentation

## 2021-09-10 DIAGNOSIS — Z86010 Personal history of colonic polyps: Secondary | ICD-10-CM | POA: Insufficient documentation

## 2021-09-10 DIAGNOSIS — C7951 Secondary malignant neoplasm of bone: Secondary | ICD-10-CM | POA: Insufficient documentation

## 2021-09-10 DIAGNOSIS — D3502 Benign neoplasm of left adrenal gland: Secondary | ICD-10-CM | POA: Insufficient documentation

## 2021-09-10 DIAGNOSIS — Z5111 Encounter for antineoplastic chemotherapy: Secondary | ICD-10-CM | POA: Insufficient documentation

## 2021-09-10 DIAGNOSIS — E7800 Pure hypercholesterolemia, unspecified: Secondary | ICD-10-CM | POA: Insufficient documentation

## 2021-09-10 DIAGNOSIS — Z79899 Other long term (current) drug therapy: Secondary | ICD-10-CM | POA: Insufficient documentation

## 2021-09-10 DIAGNOSIS — Z510 Encounter for antineoplastic radiation therapy: Secondary | ICD-10-CM | POA: Diagnosis not present

## 2021-09-10 DIAGNOSIS — F17210 Nicotine dependence, cigarettes, uncomplicated: Secondary | ICD-10-CM | POA: Insufficient documentation

## 2021-09-10 DIAGNOSIS — G893 Neoplasm related pain (acute) (chronic): Secondary | ICD-10-CM | POA: Diagnosis not present

## 2021-09-10 DIAGNOSIS — Z515 Encounter for palliative care: Secondary | ICD-10-CM | POA: Diagnosis not present

## 2021-09-10 DIAGNOSIS — Z809 Family history of malignant neoplasm, unspecified: Secondary | ICD-10-CM | POA: Insufficient documentation

## 2021-09-10 MED ORDER — MELOXICAM 15 MG PO TABS
15.0000 mg | ORAL_TABLET | Freq: Every day | ORAL | 0 refills | Status: DC
  Filled 2021-09-10: qty 30, 30d supply, fill #0

## 2021-09-10 MED ORDER — MORPHINE SULFATE ER 15 MG PO TBCR
15.0000 mg | EXTENDED_RELEASE_TABLET | Freq: Two times a day (BID) | ORAL | 0 refills | Status: DC
  Filled 2021-09-10 (×2): qty 30, 15d supply, fill #0

## 2021-09-10 NOTE — Progress Notes (Signed)
Follow up phone call made to patient after visit with Josh today. Pt states that she was able to pick up one prescription today for the meloxicam and will get the prescription for MS Contin. Pt stated that she is overwhelmed with all the new prescriptions recently. Informed pt that will bring her a med list at her radiation appt tomorrow to further review to help provide reassurance. Pt informed that we are working on getting her connected with resources for financial assistance. Encouraged pt to follow up with social worker to help address applying for medicaid/disability. Pt stated that she will try to call this afternoon and stated she had her number. Will follow up with pt at radiation appt tomorrow and provide update on financial assistance.

## 2021-09-10 NOTE — Progress Notes (Signed)
Palliative care visit for pain control. Reports that she still has a lot of pain behind right shoulder. Currently rates pain 10/10. Pt is tearful as she reports this.

## 2021-09-10 NOTE — Progress Notes (Signed)
[place]  [phone] [phone]   Name: Stacie Kennedy Date: 09/10/2021 MRN: [id number]  DOB: [birth year]  Patient Care Team: Langston Reusing, NP as PCP - General (Gerontology) Telford Nab, RN as Oncology Nurse Navigator    REASON FOR CONSULTATION: Stacie Kennedy is a 60 y.o. 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  Patient has had severe pain with scapular metastases and was started on XRT.  She is referred to palliative care to help address goals and manage ongoing symptoms.   SOCIAL HISTORY:     reports that she has been smoking cigarettes. She has a 10.75 pack-year smoking history. She has never used smokeless tobacco. She reports current alcohol use of about 2.0 standard drinks per week. She reports that she does not use drugs.  ADVANCE DIRECTIVES:  Does not have  CODE STATUS:   PAST MEDICAL HISTORY: Past Medical History:  Diagnosis Date   Cancer ([place])    lung cancer   Depression    Duodenitis    Dyspnea    Family history of cancer    High cholesterol    Personal history of colonic polyps    Pre-diabetes    Umbilical hernia    UTI (urinary tract infection)     PAST SURGICAL HISTORY:  Past Surgical History:  Procedure Laterality Date   COLONOSCOPY WITH PROPOFOL N/A 03/24/2021   Procedure: COLONOSCOPY WITH PROPOFOL;  Surgeon: Jonathon Bellows, MD;  Location: [facility] ENDOSCOPY;  Service: Gastroenterology;  Laterality: N/A;   INTERCOSTAL NERVE BLOCK  09/19/2020   Procedure: INTERCOSTAL NERVE BLOCK;  Surgeon: Melrose Nakayama, MD;  Location: [place];  Service: Thoracic;;   LUNG LOBECTOMY Right    LUNG REMOVAL, PARTIAL Right 09/19/2020   NODE DISSECTION Right 09/19/2020   Procedure: NODE DISSECTION;   Surgeon: Melrose Nakayama, MD;  Location: [place];  Service: Thoracic;  Laterality: Right;   VIDEO BRONCHOSCOPY N/A 07/08/2021   Procedure: VIDEO BRONCHOSCOPY;  Surgeon: Melrose Nakayama, MD;  Location: [place];  Service: Thoracic;  Laterality: N/A;    HEMATOLOGY/ONCOLOGY HISTORY:  Oncology History Overview Note  # NOV -[id number] [LUNG CANCER SCREENING PROGRAM]-18 mm right upper lobe lung nodule; [place] 2021- s/p right upper lobectomy [Dr. Roxan Hockey; [place]]; STAGE: I [pT-18 mm; LN-12=0]; predominant large cell neuroendocrine; minor adenocarcinoma.  Declines adjuvant chemotherapy.  #Recurrent/stage IV cancer NOV 2022- PET scan-scapular lesion liver lesion gastrohepatic lymphadenopathy.  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  Visceral Pleura Invasion: Not identified  Direct Invasion of Adjacent Structures: No adjacent structures present  Lymphovascular Invasion: Not identified  Margins: All margins negative for invasive carcinoma       Closest Margin(s) to Invasive Carcinoma: Bronchovascular margin  Treatment Effect: No known presurgical therapy  Regional Lymph Nodes:       Number of Lymph Nodes Involved: 0  Nodal Sites with Tumor: Not applicable       Number of Lymph Nodes Examined: 12      Primary cancer of right upper lobe of lung ([place])  11/03/2020 Initial Diagnosis   Primary cancer of right upper lobe of lung ([place])   11/03/2020 Cancer Staging   Staging form: Lung, AJCC 8th Edition - Pathologic: Stage IA3 (pT1c, pN0, cM0) - Signed by Cammie Sickle, MD on 11/04/2020    09/14/2021 -  Chemotherapy   Patient is on Treatment Plan : LUNG SCLC Carboplatin + Etoposide + Atezolizumab Induction q21d / Atezolizumab Maintenance q21d       ALLERGIES:  has No Known Allergies.  MEDICATIONS:  Current Outpatient Medications  Medication Sig Dispense Refill   meloxicam (MOBIC) 15 MG tablet Take 1 tablet (15 mg total) by mouth daily. 30 tablet 0   morphine (MS CONTIN) 15 MG 12 hr tablet Take 1 tablet (15 mg total) by mouth every 12 (twelve) hours. 30 tablet 0   albuterol (VENTOLIN HFA) 108 (90 Base) MCG/ACT inhaler Inhale 2 puffs into the lungs every 6 (six) hours as needed for wheezing or shortness of breath. (Patient not taking: Reported on 08/21/2021) 6.7 g 2   buPROPion (WELLBUTRIN SR) 150 MG 12 hr tablet Take 1 tablet (150 mg total) by mouth 2 (two) times daily. 60 tablet 0   Lidocaine 4 % PTCH Place onto the skin.     lidocaine-prilocaine (EMLA) cream Apply one application topically the the affected area(s) daily as needed. 30 g 3   metFORMIN (GLUCOPHAGE) 500 MG tablet TAKE ONE TABLET BY MOUTH EVERY MORNING WITH BREAKFAST 30 tablet 3   ondansetron (ZOFRAN) 4 MG tablet TAKE 2 TABLETS (8MG) BY MOUTH ONCE EVERY 8 HOURS AS NEEDED FOR NAUSEA OR VOMITING. 80 tablet 1   Oxycodone HCl 10 MG TABS Take 1 tablet (10 mg total) by mouth every 4 (four) hours as needed (pain). 60 tablet 0   predniSONE (DELTASONE) 10 MG tablet Take 2 tablets (53m) by mouth x 3 days, then take 1 tablet (170m x 3 days, then take 1/2 tablet (32m63mx 3 days, then stop 15 tablet 0   prochlorperazine (COMPAZINE) 10 MG tablet Take 1 tablet (10 mg total) by mouth once every 6 (six) hours as needed for nausea or vomiting. 40 tablet 1   rosuvastatin (CRESTOR) 10 MG tablet TAKE ONE TABLET BY MOUTH EVERY DAY 30 tablet 3   No current facility-administered medications for this visit.    VITAL SIGNS: BP (!) 146/67   Pulse 70   Temp (!) 97.5 F (36.4 C) (Tympanic)   SpO2 100%  There were no vitals filed for  this visit.  Estimated body mass index is 23.95 kg/m as calculated from the following:   Height as of 08/12/21: 5' 7.5" (1.715 m).   Weight as of 08/31/21: 155 lb 3.2 oz (70.4 kg).  LABS: CBC:    Component Value Date/Time   WBC 6.6 08/12/2021 1633   HGB 14.4 08/12/2021 1633   HGB 15.5 11/26/2020 1214   HCT 44.0 08/12/2021 1633   HCT 47.6 (H) 11/26/2020 1214   PLT 323 08/12/2021 1633   PLT 317 11/26/2020 1214   MCV 87.6 08/12/2021 1633   MCV 87 11/26/2020 1214   NEUTROABS 2.3 08/12/2021 1633   NEUTROABS 2.5 11/26/2020 1214   LYMPHSABS 3.7 08/12/2021 1633   LYMPHSABS 3.6 (H) 11/26/2020 1214   MONOABS 0.5 08/12/2021 1633   EOSABS 0.1 08/12/2021 1633   EOSABS  0.2 11/26/2020 1214   BASOSABS 0.1 08/12/2021 1633   BASOSABS 0.1 11/26/2020 1214   Comprehensive Metabolic Panel:    Component Value Date/Time   NA 136 08/12/2021 1750   NA 139 11/26/2020 1214   K 4.4 08/12/2021 1750   CL 105 08/12/2021 1750   CO2 27 08/12/2021 1750   BUN 15 08/12/2021 1750   BUN 8 11/26/2020 1214   CREATININE 0.71 08/12/2021 1750   GLUCOSE 99 08/12/2021 1750   CALCIUM 8.8 (L) 08/12/2021 1750   AST 17 08/12/2021 1750   ALT 10 08/12/2021 1750   ALKPHOS 74 08/12/2021 1750   BILITOT 0.9 08/12/2021 1750   BILITOT 0.3 11/26/2020 1214   PROT 7.0 08/12/2021 1750   PROT 6.9 11/26/2020 1214   ALBUMIN 3.6 08/12/2021 1750   ALBUMIN 4.0 11/26/2020 1214    RADIOGRAPHIC STUDIES: DG Chest 2 View  Result Date: 08/12/2021 CLINICAL DATA:  Right arm pain. EXAM: CHEST - 2 VIEW COMPARISON:  Chest x-ray 07/08/2021 FINDINGS: The cardiac silhouette, mediastinal and hilar contours are within normal limits and stable. Stable surgical and possible radiation changes in the right hilum. There also stable scarring changes in the right upper lobe. No pneumothorax or pleural effusion. No pulmonary lesions. There is a lytic lesion involving the right scapular body. This may have been present on the prior chest x-ray 928 but  was much smaller and I do not see it all on the prior chest CT from 05/20/2021. IMPRESSION: 1. No acute cardiopulmonary findings. 2. Stable surgical and possible radiation changes in the right hilum. 3. Lytic lesion involving the right scapular body. Electronically Signed   By: Marijo Sanes M.D.   On: 08/12/2021 16:26   CT CHEST W CONTRAST  Result Date: 08/13/2021 CLINICAL DATA:  Pretreatment staging for non-small cell lung cancer in a 60 year old female. EXAM: CT CHEST WITH CONTRAST TECHNIQUE: Multidetector CT imaging of the chest was performed during intravenous contrast administration. CONTRAST:  37m OMNIPAQUE IOHEXOL 300 MG/ML  SOLN COMPARISON:  May 20, 2021. FINDINGS: Cardiovascular: Aortic atherosclerosis, coronary artery disease and postoperative alteration of RIGHT hilum as before. No substantial pericardial effusion. No signs of aortic aneurysm. Mediastinum/Nodes: No thoracic inlet adenopathy. No axillary adenopathy. No mediastinal adenopathy. Lungs/Pleura: Near complete collapse of RIGHT middle lobe following RIGHT upper lobectomy. This is similar to the prior study. No effusion. No consolidative process. Basilar scarring. Airways are patent. Upper Abdomen: Hepatic lesion measuring up to 4.6 x 3.8 cm (image 130/2). Hepatic subsegment II, LEFT hepatic lobe near the IVC confluence. Hepatic lesion in the LEFT hepatic lobe (image 138/2) 13 mm. Subtle low-density in the RIGHT hepatic lobe may also represent a metastatic lesion (image 175/2) 10 mm. Signs of mildly enlarged gastrohepatic lymph node (image 145/2) 12 mm. Stable benign appearing LEFT adrenal lesion, well-circumscribed and 2.6 cm. No acute gastrointestinal process to the extent evaluated. Musculoskeletal: Large destructive lesion in the RIGHT scapula destroying the lateral scapular body soft tissue measuring 3.6 x 3.2 cm (image 40/2) Subtle lytic changes in the posterior margin/posterior cortex of the sternal manubrium are similar and may be  developmental measuring 2-3 mm, for example on image 36/2 and images just below this level. No additional suspicious bone abnormality. IMPRESSION: Large destructive mass centered in the body of the scapula along the lateral margin with frank cortical destruction and enhancing soft tissue. Signs of hepatic metastatic disease largest lesion in hepatic subsegment II as described. Postoperative changes in the chest and associated volume loss in the RIGHT middle  lobe with similar appearance to prior imaging. Potential nodal disease in the upper abdomen as well. These results will be called to the ordering clinician or representative by the Radiologist Assistant, and communication documented in the PACS or [employer]. Aortic Atherosclerosis (ICD10-I70.0) and Emphysema (ICD10-J43.9). Electronically Signed   By: Zetta Bills M.D.   On: 08/13/2021 16:33   NM PET Image Restage (PS) Skull Base to Thigh (F-18 FDG)  Result Date: 08/28/2021 CLINICAL DATA:  Subsequent treatment strategy for non-small cell lung cancer. RIGHT upper lobe lobectomy 09/19/2020 EXAM: NUCLEAR MEDICINE PET SKULL BASE TO THIGH TECHNIQUE: 8.4 mCi F-18 FDG was injected intravenously. Full-ring PET imaging was performed from the skull base to thigh after the radiotracer. CT data was obtained and used for attenuation correction and anatomic localization. Fasting blood glucose: 101 mg/dl COMPARISON:  PET-CT 08/20/2020 FINDINGS: Mediastinal blood pool activity: SUV max 2.6 Liver activity: SUV max NA NECK: No hypermetabolic lymph nodes in the neck. Incidental CT findings: none CHEST: Postsurgical change in the RIGHT hemithorax with RIGHT upper lobectomy and resection of the hypermetabolic nodule. No or enlarged nodules within the RIGHT lung. No LEFT lung lesion. No hypermetabolic mediastinal lymph nodes. Incidental CT findings: Coronary artery calcification and aortic atherosclerotic calcification. ABDOMEN/PELVIS: Within the posterior aspect of the  LEFT hepatic lobe there is a new hypermetabolic lesion measuring 4.1 cm with SUV max equal 10.5 on image 158 of the fused data set. Small lesion in the anterior subcapsular LEFT hepatic lobe measures 1.5 cm with SUV max equal 5.7 on image 152. Benign adenoma of the LEFT adrenal gland. Single hypermetabolic gastrohepatic ligament node on image 149 with SUV max equal 6.6. No pelvic adenopathy. Incidental CT findings: Uterus and adnexa unremarkable. SKELETON: Within the RIGHT scapula there is a hypermetabolic expansile mass measuring 2.6 x 2.7 cm with SUV max equal 10.9. This mass erupted through the cortex into the musculature. No additional skeletal metastasis Incidental CT findings: none IMPRESSION: 1. Unfortunately, there is evidence of multi organ lung cancer metastasis. 2. Two new hypermetabolic metastasis in LEFT hepatic lobe. 3. Single hypermetabolic gastrohepatic ligament lymph node. 4. 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 This large lesion erupts through the cortex into the adjacent musculature. These results will be called to the ordering clinician or representative by the Radiologist Assistant, and communication documented in the PACS or [employer]. Electronically Signed   By: Suzy Bouchard M.D.   On: 08/28/2021 10:19   DG Humerus Right  Addendum Date: 08/12/2021   ADDENDUM REPORT: 08/12/2021 16:27 ADDENDUM: After reviewing the chest x-ray I noticed a lesion in the right scapula. This is very evident on the lateral upper humerus film. This destructive bony lesion is new and likely accounts for the patient's pain. I do not see any other definite bone lesions. PET-CT may be helpful for further evaluation of other disease. Electronically Signed   By: Marijo Sanes M.D.   On: 08/12/2021 16:27   Result Date: 08/12/2021 CLINICAL DATA:  Right arm pain since Monday. EXAM: RIGHT HUMERUS - 2+ VIEW COMPARISON:  None. FINDINGS: Mild AC joint degenerative changes are noted. The glenohumeral  joint is maintained. The elbow joint appears normal. The right humerus is intact. No fracture or bone lesions. IMPRESSION: 1. No acute bony findings. 2. Mild AC joint degenerative changes. Electronically Signed: By: Marijo Sanes M.D. On: 08/12/2021 16:19    PERFORMANCE STATUS (ECOG) : 1 - Symptomatic but completely ambulatory  Review of Systems Unless otherwise noted, a complete review  of systems is negative.  Physical Exam General: NAD Pulmonary: Unlabored Extremities: no edema, no joint deformities Skin: no rashes Neurological: Weakness but otherwise nonfocal  IMPRESSION: Patient was an add-on to my clinic schedule today for evaluation of persistent right shoulder pain.  This is at the site of her known bony destruction and is the targeted area for current XRT.  Unfortunately, patient reports that she has not had significant improvement in pain with XRT treatments.  She is currently taking oxycodone 10 mg every 4 hours around-the-clock.  She is not sleeping well.  She is tearful and reports that pain is significantly impacting her quality of life.  I had previously tried patient on a tapered course of prednisone.  Patient did find that the steroid helped the pain.  This reflects a probable inflammatory aspect of her bony destruction.  Patient was previously supposed to start meloxicam but does not appear that she has done this.  We will send new prescription for meloxicam at 15 mg dose.  We will also plan to start patient on long-acting morphine to provide better around-the-clock pain control.  Note the patient does not have insurance so we will try to be judicious with her prescribing.  Spoke with [facility] outpatient pharmacist regarding cost.  Consider referral to interventional pain management if pain remains poorly controlled.   PLAN: -Continue current scope of treatment -Continue oxycodone as needed -Start MS Contin 15 mg every 12 hours -Start meloxicam 15 mg daily -Daily bowel  regimen -Future ACP conversation -Referral to LCSW for assistance with medication cost/Medicaid -Consider possible future referral to interventional pain management if needed -Follow-up telephone visit next week   Patient expressed understanding and was in agreement with this plan. She also understands that She can call the clinic at any time with any questions, concerns, or complaints.     Time Total: 15 minutes  Visit consisted of counseling and education dealing with the complex and emotionally intense issues of symptom management and palliative care in the setting of serious and potentially life-threatening illness.Greater than 50%  of this time was spent counseling and coordinating care related to the above assessment and plan.  Signed by: Altha Harm, PhD, NP-C

## 2021-09-11 ENCOUNTER — Ambulatory Visit: Payer: Medicaid Other

## 2021-09-11 ENCOUNTER — Other Ambulatory Visit: Payer: Self-pay

## 2021-09-11 ENCOUNTER — Telehealth: Payer: Self-pay

## 2021-09-11 NOTE — Telephone Encounter (Signed)
Called pt to f/u with message re: dizziness. Tried to encourage pt to come in for evaluation, but she declined and stated "I'm just going to lay down". I offered to call another family member for transportation, but she stated that one one would be able to. Instructed her to go to ED if no improvement.

## 2021-09-14 ENCOUNTER — Encounter: Payer: Self-pay | Admitting: *Deleted

## 2021-09-14 ENCOUNTER — Inpatient Hospital Stay (HOSPITAL_BASED_OUTPATIENT_CLINIC_OR_DEPARTMENT_OTHER): Payer: Medicaid Other | Admitting: Hospice and Palliative Medicine

## 2021-09-14 ENCOUNTER — Encounter: Payer: Self-pay | Admitting: Internal Medicine

## 2021-09-14 ENCOUNTER — Ambulatory Visit
Admission: RE | Admit: 2021-09-14 | Discharge: 2021-09-14 | Disposition: A | Discharge status: Home / Self Care | Payer: Medicaid Other | Source: Ambulatory Visit | Attending: Radiation Oncology | Admitting: Radiation Oncology

## 2021-09-14 ENCOUNTER — Inpatient Hospital Stay: Payer: Medicaid Other

## 2021-09-14 ENCOUNTER — Other Ambulatory Visit: Payer: Self-pay

## 2021-09-14 ENCOUNTER — Other Ambulatory Visit: Payer: Self-pay | Admitting: Internal Medicine

## 2021-09-14 ENCOUNTER — Inpatient Hospital Stay (HOSPITAL_BASED_OUTPATIENT_CLINIC_OR_DEPARTMENT_OTHER): Payer: Medicaid Other | Admitting: Internal Medicine

## 2021-09-14 VITALS — BP 130/76 | HR 90

## 2021-09-14 VITALS — BP 110/75 | HR 78 | Temp 97.6°F | Resp 16 | Ht 67.5 in | Wt 152.0 lb

## 2021-09-14 DIAGNOSIS — H532 Diplopia: Secondary | ICD-10-CM

## 2021-09-14 DIAGNOSIS — Z510 Encounter for antineoplastic radiation therapy: Secondary | ICD-10-CM | POA: Diagnosis not present

## 2021-09-14 DIAGNOSIS — C3411 Malignant neoplasm of upper lobe, right bronchus or lung: Secondary | ICD-10-CM

## 2021-09-14 DIAGNOSIS — Z515 Encounter for palliative care: Secondary | ICD-10-CM | POA: Diagnosis not present

## 2021-09-14 DIAGNOSIS — G893 Neoplasm related pain (acute) (chronic): Secondary | ICD-10-CM

## 2021-09-14 LAB — CBC WITH DIFFERENTIAL/PLATELET
Abs Immature Granulocytes: 0.02 10*3/uL (ref 0.00–0.07)
Basophils Absolute: 0 10*3/uL (ref 0.0–0.1)
Basophils Relative: 1 %
Eosinophils Absolute: 0.1 10*3/uL (ref 0.0–0.5)
Eosinophils Relative: 2 %
HCT: 45 % (ref 36.0–46.0)
Hemoglobin: 15.2 g/dL — ABNORMAL HIGH (ref 12.0–15.0)
Immature Granulocytes: 0 %
Lymphocytes Relative: 43 %
Lymphs Abs: 3.2 10*3/uL (ref 0.7–4.0)
MCH: 29.9 pg (ref 26.0–34.0)
MCHC: 33.8 g/dL (ref 30.0–36.0)
MCV: 88.6 fL (ref 80.0–100.0)
Monocytes Absolute: 0.9 10*3/uL (ref 0.1–1.0)
Monocytes Relative: 12 %
Neutro Abs: 3 10*3/uL (ref 1.7–7.7)
Neutrophils Relative %: 42 %
Platelets: 315 10*3/uL (ref 150–400)
RBC: 5.08 MIL/uL (ref 3.87–5.11)
RDW: 16.7 % — ABNORMAL HIGH (ref 11.5–15.5)
WBC: 7.2 10*3/uL (ref 4.0–10.5)
nRBC: 0 % (ref 0.0–0.2)

## 2021-09-14 LAB — COMPREHENSIVE METABOLIC PANEL
ALT: 12 U/L (ref 0–44)
AST: 13 U/L — ABNORMAL LOW (ref 15–41)
Albumin: 4 g/dL (ref 3.5–5.0)
Alkaline Phosphatase: 91 U/L (ref 38–126)
Anion gap: 9 (ref 5–15)
BUN: 22 mg/dL — ABNORMAL HIGH (ref 6–20)
CO2: 28 mmol/L (ref 22–32)
Calcium: 9.6 mg/dL (ref 8.9–10.3)
Chloride: 101 mmol/L (ref 98–111)
Creatinine, Ser: 0.63 mg/dL (ref 0.44–1.00)
GFR, Estimated: >60 mL/min (ref >60)
Glucose, Bld: 113 mg/dL — ABNORMAL HIGH (ref 70–99)
Potassium: 3.7 mmol/L (ref 3.5–5.1)
Sodium: 138 mmol/L (ref 135–145)
Total Bilirubin: 0.3 mg/dL (ref 0.3–1.2)
Total Protein: 7.7 g/dL (ref 6.5–8.1)

## 2021-09-14 LAB — TSH: TSH: 1.806 u[IU]/mL (ref 0.350–4.500)

## 2021-09-14 MED ORDER — SODIUM CHLORIDE 0.9 % IV SOLN
100.0000 mg/m2 | Freq: Once | INTRAVENOUS | Status: AC
  Administered 2021-09-14: 180 mg via INTRAVENOUS
  Filled 2021-09-14: qty 9

## 2021-09-14 MED ORDER — SODIUM CHLORIDE 0.9 % IV SOLN
10.0000 mg | Freq: Once | INTRAVENOUS | Status: AC
  Administered 2021-09-14: 10 mg via INTRAVENOUS
  Filled 2021-09-14: qty 10

## 2021-09-14 MED ORDER — SODIUM CHLORIDE 0.9 % IV SOLN
1200.0000 mg | Freq: Once | INTRAVENOUS | Status: AC
  Administered 2021-09-14: 1200 mg via INTRAVENOUS
  Filled 2021-09-14: qty 20

## 2021-09-14 MED ORDER — SODIUM CHLORIDE 0.9 % IV SOLN
150.0000 mg | Freq: Once | INTRAVENOUS | Status: AC
  Administered 2021-09-14: 150 mg via INTRAVENOUS
  Filled 2021-09-14: qty 150

## 2021-09-14 MED ORDER — PALONOSETRON HCL INJECTION 0.25 MG/5ML
0.2500 mg | Freq: Once | INTRAVENOUS | Status: AC
  Administered 2021-09-14: 0.25 mg via INTRAVENOUS
  Filled 2021-09-14: qty 5

## 2021-09-14 MED ORDER — SODIUM CHLORIDE 0.9 % IV SOLN
540.0000 mg | Freq: Once | INTRAVENOUS | Status: AC
  Administered 2021-09-14: 540 mg via INTRAVENOUS
  Filled 2021-09-14: qty 54

## 2021-09-14 MED ORDER — SODIUM CHLORIDE 0.9 % IV SOLN
Freq: Once | INTRAVENOUS | Status: AC
Start: 2021-09-14 — End: 2021-09-14
  Filled 2021-09-14: qty 250

## 2021-09-14 NOTE — Assessment & Plan Note (Addendum)
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 PET 08/28/2021-metastatic disease noted in the liver/left hepatic lobe; hypermetabolic gastrohepatic lymph node; and also left scapular metastases with destruction of the bone [see below].  NGS negative for any targeted options at this time; PDL-1=0.  # Proceed with  chemo immunotherapy carbo etoposide-Tecentriq- cyle #1 today/this week.  Patient understand treatments are palliative not curative.  # Pain right shoulder- currenrly on RT until 12/07; continue oxycodone 10 mg every 6 hours as needed.  Holding off meloxicam-?  Diplopia-see below.  Recommend MS Contin as ordered.  #Diplopia-intermittent; no other neurologic signs noted.  Question related to medication versus malignancy.  Recommend stat MRI of the brain.  Discussed the potential side effects including but not limited to-increasing fatigue, nausea vomiting, diarrhea, hair loss, sores in the mouth, increase risk of infection and also neuropathy.    I discussed the mechanism of action; The goal of therapy is palliative; and length of treatments are likely ongoing/based upon the results of the scans. Discussed the potential side effects of immunotherapy including but not limited to diarrhea; skin rash; elevated LFTs/endocrine abnormalities etc.  # pain control/scapular metastases right-s/p evaluation with Dr. Donella Stade- start RT 11/21 to 12/06.Continue Oxy 10 mg 1 pill every 6 hours; and also on prednisone 10 mg/day. Consider Zometa.   #IV access: pt reluctant with port placement; finally agrees.  # DISPOSITION: Chemo today STAT brain MRI this week 2 weeks - port lab, [place], poss IVF 1/3 - port lab, Dr. Jacinto Reap for [place], etop, tecentriq 1/4 and 1/5 - etop only -hr

## 2021-09-14 NOTE — Progress Notes (Signed)
Pt tolerated treatment well with no signs of complications. VSS. RN educated pt on the importance of notifying the clinic if any complications occur at home and when to seek emergency care. Pt verbalized understanding and all questions answered at this time. Pt stable for discharge, appointments given to pt.  Stacie Kennedy [employer]

## 2021-09-14 NOTE — Progress Notes (Signed)
[place]  [phone] [phone]   Name: Stacie Kennedy Date: 09/14/2021 MRN: [id number]  DOB: January 20, 1961  Patient Care Team: Langston Reusing, NP as PCP - General (Gerontology) Telford Nab, RN as Oncology Nurse Navigator    REASON FOR CONSULTATION: Stacie Kennedy is a 60 y.o. 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  Patient has had severe pain with scapular metastases and was started on XRT.  She is referred to palliative care to help address goals and manage ongoing symptoms.   SOCIAL HISTORY:     reports that she has been smoking cigarettes. She has a 10.75 pack-year smoking history. She has never used smokeless tobacco. She reports current alcohol use of about 2.0 standard drinks per week. She reports that she does not use drugs.  ADVANCE DIRECTIVES:  Does not have  CODE STATUS:   PAST MEDICAL HISTORY: Past Medical History:  Diagnosis Date   Cancer ([place])    lung cancer   Depression    Duodenitis    Dyspnea    Family history of cancer    High cholesterol    Personal history of colonic polyps    Pre-diabetes    Umbilical hernia    UTI (urinary tract infection)     PAST SURGICAL HISTORY:  Past Surgical History:  Procedure Laterality Date   COLONOSCOPY WITH PROPOFOL N/A 03/24/2021   Procedure: COLONOSCOPY WITH PROPOFOL;  Surgeon: Jonathon Bellows, MD;  Location: [facility];  Service: Gastroenterology;  Laterality: N/A;   INTERCOSTAL NERVE BLOCK  09/19/2020   Procedure: INTERCOSTAL NERVE BLOCK;  Surgeon: Melrose Nakayama, MD;  Location: [place];  Service: Thoracic;;   LUNG LOBECTOMY Right    LUNG REMOVAL, PARTIAL Right 09/19/2020   NODE DISSECTION Right 09/19/2020   Procedure: NODE DISSECTION;   Surgeon: Melrose Nakayama, MD;  Location: [place];  Service: Thoracic;  Laterality: Right;   VIDEO BRONCHOSCOPY N/A 07/08/2021   Procedure: VIDEO BRONCHOSCOPY;  Surgeon: Melrose Nakayama, MD;  Location: [place];  Service: Thoracic;  Laterality: N/A;    HEMATOLOGY/ONCOLOGY HISTORY:  Oncology History Overview Note  # NOV -[id number] [LUNG CANCER SCREENING PROGRAM]-18 mm right upper lobe lung nodule; [place] 2021- s/p right upper lobectomy [Dr. Roxan Hockey; [place]]; STAGE: I [pT-18 mm; LN-12=0]; predominant large cell neuroendocrine; minor adenocarcinoma.  Declines adjuvant chemotherapy.  #Recurrent/stage IV cancer NOV 2022- PET scan-scapular lesion liver lesion gastrohepatic lymphadenopathy.  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  Visceral Pleura Invasion: Not identified  Direct Invasion of Adjacent Structures: No adjacent structures present  Lymphovascular Invasion: Not identified  Margins: All margins negative for invasive carcinoma       Closest Margin(s) to Invasive Carcinoma: Bronchovascular margin  Treatment Effect: No known presurgical therapy  Regional Lymph Nodes:       Number of Lymph Nodes Involved: 0  Nodal Sites with Tumor: Not applicable       Number of Lymph Nodes Examined: 12      Primary cancer of right upper lobe of lung ([place])  11/03/2020 Initial Diagnosis   Primary cancer of right upper lobe of lung ([place])   11/03/2020 Cancer Staging   Staging form: Lung, AJCC 8th Edition - Pathologic: Stage IA3 (pT1c, pN0, cM0) - Signed by Cammie Sickle, MD on 11/04/2020    09/14/2021 -  Chemotherapy   Patient is on Treatment Plan : LUNG SCLC Carboplatin + Etoposide + Atezolizumab Induction q21d / Atezolizumab Maintenance q21d       ALLERGIES:  has No Known Allergies.  MEDICATIONS:  Current Outpatient Medications  Medication Sig Dispense Refill   albuterol (VENTOLIN HFA) 108 (90 Base) MCG/ACT inhaler Inhale 2 puffs into the lungs every 6 (six) hours as needed for wheezing or shortness of breath. (Patient not taking: Reported on 09/14/2021) 6.7 g 2   buPROPion (WELLBUTRIN SR) 150 MG 12 hr tablet Take 1 tablet (150 mg total) by mouth 2 (two) times daily. 60 tablet 0   lidocaine-prilocaine (EMLA) cream Apply one application topically the the affected area(s) daily as needed. 30 g 3   meloxicam (MOBIC) 15 MG tablet Take 1 tablet (15 mg total) by mouth daily. (Patient not taking: Reported on 09/14/2021) 30 tablet 0   metFORMIN (GLUCOPHAGE) 500 MG tablet TAKE ONE TABLET BY MOUTH EVERY MORNING WITH BREAKFAST 30 tablet 3   morphine (MS CONTIN) 15 MG 12 hr tablet Take 1 tablet (15 mg total) by mouth every 12 (twelve) hours. 30 tablet 0   ondansetron (ZOFRAN) 4 MG tablet TAKE 2 TABLETS (8MG) BY MOUTH ONCE EVERY 8 HOURS AS NEEDED FOR NAUSEA OR VOMITING. 80 tablet 1   Oxycodone HCl 10 MG TABS Take 1 tablet (10 mg total) by mouth every 4 (four) hours as needed (pain). 60 tablet 0   prochlorperazine (COMPAZINE) 10 MG tablet Take 1 tablet (10 mg total) by mouth once every 6 (six) hours as needed for nausea or vomiting. 40 tablet 1   rosuvastatin (CRESTOR) 10 MG tablet TAKE ONE TABLET BY MOUTH EVERY DAY (Patient not taking: Reported on 09/14/2021) 30 tablet 3   No current facility-administered medications for this visit.   Facility-Administered Medications Ordered in Other Visits  Medication Dose Route Frequency Provider Last Rate Last Admin   atezolizumab (TECENTRIQ) 1,200 mg in sodium chloride 0.9 % 250 mL chemo infusion  1,200 mg Intravenous Once Cammie Sickle, MD        CARBOplatin (PARAPLATIN) 540 mg in sodium chloride 0.9 % 250 mL chemo infusion  540 mg Intravenous Once Cammie Sickle, MD       etoposide (VEPESID) 180 mg in sodium chloride 0.9 % 500 mL chemo infusion  100 mg/m2 (Treatment Plan Recorded) Intravenous Once Cammie Sickle, MD       fosaprepitant (EMEND) 150 mg in sodium chloride 0.9 % 145 mL IVPB  150 mg Intravenous Once Cammie Sickle, MD 450 mL/hr at 09/14/21 1107 150 mg at 09/14/21 1107    VITAL SIGNS: There were no vitals taken for this visit. There were no vitals filed for this visit.  Estimated body mass index is 23.46 kg/m as calculated from the following:   Height as of an earlier encounter on 09/14/21: 5' 7.5" (1.715 m).   Weight as of an earlier encounter on 09/14/21: 152 lb (68.9 kg).  LABS: CBC:    Component Value Date/Time  WBC 7.2 09/14/2021 0837   HGB 15.2 (H) 09/14/2021 0837   HGB 15.5 11/26/2020 1214   HCT 45.0 09/14/2021 0837   HCT 47.6 (H) 11/26/2020 1214   PLT 315 09/14/2021 0837   PLT 317 11/26/2020 1214   MCV 88.6 09/14/2021 0837   MCV 87 11/26/2020 1214   NEUTROABS 3.0 09/14/2021 0837   NEUTROABS 2.5 11/26/2020 1214   LYMPHSABS 3.2 09/14/2021 0837   LYMPHSABS 3.6 (H) 11/26/2020 1214   MONOABS 0.9 09/14/2021 0837   EOSABS 0.1 09/14/2021 0837   EOSABS 0.2 11/26/2020 1214   BASOSABS 0.0 09/14/2021 0837   BASOSABS 0.1 11/26/2020 1214   Comprehensive Metabolic Panel:    Component Value Date/Time   NA 138 09/14/2021 0837   NA 139 11/26/2020 1214   K 3.7 09/14/2021 0837   CL 101 09/14/2021 0837   CO2 28 09/14/2021 0837   BUN 22 (H) 09/14/2021 0837   BUN 8 11/26/2020 1214   CREATININE 0.63 09/14/2021 0837   GLUCOSE 113 (H) 09/14/2021 0837   CALCIUM 9.6 09/14/2021 0837   AST 13 (L) 09/14/2021 0837   ALT 12 09/14/2021 0837   ALKPHOS 91 09/14/2021 0837   BILITOT 0.3 09/14/2021 0837   BILITOT 0.3 11/26/2020 1214   PROT 7.7 09/14/2021 0837   PROT 6.9 11/26/2020 1214   ALBUMIN 4.0  09/14/2021 0837   ALBUMIN 4.0 11/26/2020 1214    RADIOGRAPHIC STUDIES: NM PET Image Restage (PS) Skull Base to Thigh (F-18 FDG)  Result Date: 08/28/2021 CLINICAL DATA:  Subsequent treatment strategy for non-small cell lung cancer. RIGHT upper lobe lobectomy 09/19/2020 EXAM: NUCLEAR MEDICINE PET SKULL BASE TO THIGH TECHNIQUE: 8.4 mCi F-18 FDG was injected intravenously. Full-ring PET imaging was performed from the skull base to thigh after the radiotracer. CT data was obtained and used for attenuation correction and anatomic localization. Fasting blood glucose: 101 mg/dl COMPARISON:  PET-CT 08/20/2020 FINDINGS: Mediastinal blood pool activity: SUV max 2.6 Liver activity: SUV max NA NECK: No hypermetabolic lymph nodes in the neck. Incidental CT findings: none CHEST: Postsurgical change in the RIGHT hemithorax with RIGHT upper lobectomy and resection of the hypermetabolic nodule. No or enlarged nodules within the RIGHT lung. No LEFT lung lesion. No hypermetabolic mediastinal lymph nodes. Incidental CT findings: Coronary artery calcification and aortic atherosclerotic calcification. ABDOMEN/PELVIS: Within the posterior aspect of the LEFT hepatic lobe there is a new hypermetabolic lesion measuring 4.1 cm with SUV max equal 10.5 on image 158 of the fused data set. Small lesion in the anterior subcapsular LEFT hepatic lobe measures 1.5 cm with SUV max equal 5.7 on image 152. Benign adenoma of the LEFT adrenal gland. Single hypermetabolic gastrohepatic ligament node on image 149 with SUV max equal 6.6. No pelvic adenopathy. Incidental CT findings: Uterus and adnexa unremarkable. SKELETON: Within the RIGHT scapula there is a hypermetabolic expansile mass measuring 2.6 x 2.7 cm with SUV max equal 10.9. This mass erupted through the cortex into the musculature. No additional skeletal metastasis Incidental CT findings: none IMPRESSION: 1. Unfortunately, there is evidence of multi organ lung cancer metastasis. 2. Two new  hypermetabolic metastasis in LEFT hepatic lobe. 3. Single hypermetabolic gastrohepatic ligament lymph node. 4. 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 This large lesion erupts through the cortex into the adjacent musculature. These results will be called to the ordering clinician or representative by the Radiologist Assistant, and communication documented in the PACS or [employer]. Electronically Signed   By: Suzy Bouchard M.D.   On: 08/28/2021 10:19  PERFORMANCE STATUS (ECOG) : 1 - Symptomatic but completely ambulatory  Review of Systems Unless otherwise noted, a complete review of systems is negative.  Physical Exam General: NAD Pulmonary: Unlabored Extremities: no edema, no joint deformities Skin: no rashes Neurological: Weakness but otherwise nonfocal  IMPRESSION: Patient seen in infusion for follow-up.  Patient reports that she started taking the meloxicam last week and that it significantly improved her pain but she subsequently started feeling lightheaded/dizzy with blurred vision.  She stopped taking the meloxicam.  Patient is being sent for MRI of the brain.  Patient has not yet picked up the MS Contin from the pharmacy.  We again discussed her pain regimen in detail.  She does appear more comfortable today.  Consider referral to interventional pain management if pain remains poorly controlled.   Case discussed with Angie Fava, nurse navigator.  Patient was approved for medication assistance.  PLAN: -Continue current scope of treatment -Continue oxycodone as needed -Start MS Contin 15 mg every 12 hours -Hold meloxicam 15 mg daily -Daily bowel regimen -Future ACP conversation -Consider possible future referral to interventional pain management if needed -RTC 2 weeks   Patient expressed understanding and was in agreement with this plan. She also understands that She can call the clinic at any time with any questions, concerns, or complaints.     Time  Total: 15 minutes  Visit consisted of counseling and education dealing with the complex and emotionally intense issues of symptom management and palliative care in the setting of serious and potentially life-threatening illness.Greater than 50%  of this time was spent counseling and coordinating care related to the above assessment and plan.  Signed by: Altha Harm, PhD, NP-C

## 2021-09-14 NOTE — Progress Notes (Signed)
Could not come in Friday for radiation. States she could not see and had some dizziness. Feels a little bit better today. Not as much dizziness just lightheadedness today. Was given meloxicam on 12/1 for shoulder pain.

## 2021-09-14 NOTE — Progress Notes (Signed)
[place] CONSULT NOTE  Patient Care Team: Langston Reusing, NP as PCP - General (Gerontology) Telford Nab, RN as Oncology Nurse Navigator  CHIEF COMPLAINTS/PURPOSE OF CONSULTATION: lung cancer  #  Oncology History Overview Note  # NOV -[id number] [facility] CANCER SCREENING PROGRAM]-18 mm right upper lobe lung nodule; DEC 2021- s/p right upper lobectomy [Dr. Roxan Hockey; [place]]; STAGE: I [pT-18 mm; LN-12=0]; predominant large cell neuroendocrine; minor adenocarcinoma.  Declines adjuvant chemotherapy.  #Recurrent/stage IV cancer NOV 2022- PET scan-scapular lesion liver lesion gastrohepatic lymphadenopathy.  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  Visceral Pleura Invasion: Not identified  Direct Invasion of Adjacent Structures: No adjacent structures present  Lymphovascular Invasion: Not identified  Margins: All margins negative for invasive carcinoma       Closest Margin(s) to Invasive Carcinoma: Bronchovascular margin  Treatment Effect: No known presurgical therapy  Regional Lymph Nodes:       Number of Lymph Nodes Involved: 0                            Nodal Sites with Tumor: Not applicable       Number of Lymph Nodes Examined: 12      Primary cancer of right upper lobe of lung ([place])  11/03/2020 Initial Diagnosis   Primary cancer of right upper lobe of lung ([place])   11/03/2020 Cancer Staging   Staging form: Lung, AJCC 8th Edition - Pathologic: Stage IA3 (pT1c, pN0, cM0) - Signed by Cammie Sickle, MD on 11/04/2020    09/14/2021 -  Chemotherapy   Patient is on Treatment Plan : LUNG  SCLC Carboplatin + Etoposide + Atezolizumab Induction q21d / Atezolizumab Maintenance q21d        HISTORY OF PRESENTING ILLNESS: Ambulating independently.  Alone.   Stacie Kennedy 60 y.o.  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   In the interim patient was evaluated by symptom management clinic for worsening right posterior chest wall pain/shoulder pain-currently on oxycodone 10 mg every 6 hours.  Patient has not started MS Contin-as she noted to have double vision after she took meloxicam.  Patient also currently undergoing radiation  Chronic shortness of breath/chronic mild cough no hemoptysis.   Review of Systems  Constitutional:  Negative for chills, diaphoresis, fever, malaise/fatigue and weight loss.  HENT:  Negative for nosebleeds and sore throat.   Eyes:  Negative for double vision.  Respiratory:  Negative for cough, hemoptysis, sputum production, shortness of breath and wheezing.   Cardiovascular:  Negative for chest pain, palpitations, orthopnea and leg swelling.  Gastrointestinal:  Negative for abdominal pain, blood in stool, constipation, diarrhea, heartburn, melena, nausea and vomiting.  Genitourinary:  Negative for dysuria, frequency and urgency.  Musculoskeletal:  Positive for back pain, joint pain and myalgias.  Skin: Negative.  Negative for itching and rash.  Neurological:  Negative for dizziness, tingling, focal weakness, weakness and headaches.  Endo/Heme/Allergies:  Does not bruise/bleed easily.  Psychiatric/Behavioral:  Negative for depression. The patient is not nervous/anxious  and does not have insomnia.     MEDICAL HISTORY:  Past Medical History:  Diagnosis Date   Cancer [facility])    lung cancer   Depression    Duodenitis    Dyspnea    Family history of cancer    High cholesterol    Personal history of colonic polyps    Pre-diabetes    Umbilical hernia     UTI (urinary tract infection)     SURGICAL HISTORY: Past Surgical History:  Procedure Laterality Date   COLONOSCOPY WITH PROPOFOL N/A 03/24/2021   Procedure: COLONOSCOPY WITH PROPOFOL;  Surgeon: Jonathon Bellows, MD;  Location: [facility] ENDOSCOPY;  Service: Gastroenterology;  Laterality: N/A;   INTERCOSTAL NERVE BLOCK  09/19/2020   Procedure: INTERCOSTAL NERVE BLOCK;  Surgeon: Melrose Nakayama, MD;  Location: [place];  Service: Thoracic;;   LUNG LOBECTOMY Right    LUNG REMOVAL, PARTIAL Right 09/19/2020   NODE DISSECTION Right 09/19/2020   Procedure: NODE DISSECTION;  Surgeon: Melrose Nakayama, MD;  Location: [place];  Service: Thoracic;  Laterality: Right;   VIDEO BRONCHOSCOPY N/A 07/08/2021   Procedure: VIDEO BRONCHOSCOPY;  Surgeon: Melrose Nakayama, MD;  Location: [facility] OR;  Service: Thoracic;  Laterality: N/A;    SOCIAL HISTORY: Social History   Socioeconomic History   Marital status: Single    Spouse name: Not on file   Number of children: Not on file   Years of education: Not on file   Highest education level: Not on file  Occupational History   Not on file  Tobacco Use   Smoking status: Some Days    Packs/day: 0.25    Years: 43.00    Pack years: 10.75    Types: Cigarettes   Smokeless tobacco: Never   Tobacco comments:    states she is slowing, trying to quit  Vaping Use   Vaping Use: Never used  Substance and Sexual Activity   Alcohol use: Yes    Alcohol/week: 2.0 standard drinks    Types: 2 Cans of beer per week    Comment: occasional   Drug use: No   Sexual activity: Not on file  Other Topics Concern   Not on file  Social History Narrative   Lives in [place]; smokes; now and then beer; with boy friend. 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    Social Determinants of Health   Financial Resource Strain: Not on file  Food Insecurity: Food Insecurity Present   Worried About [place] in the Last Year: Sometimes true   Ran Out of  Food in the Last Year: Sometimes true  Transportation Needs: No Transportation Needs   Lack of Transportation (Medical): No   Lack of Transportation (Non-Medical): No  Physical Activity: Insufficiently Active   Days of Exercise per Week: 7 days   Minutes of Exercise per Session: 20 min  Stress: Stress Concern Present   Feeling of Stress : To some extent  Social Connections: Socially Isolated   Frequency of Communication with Friends and Family: Never   Frequency of Social Gatherings with Friends and Family: Never   Attends Religious Services: Never   Marine scientist or Organizations: No   Attends Music therapist: Never   Marital Status: Never married  Human resources officer Violence: Not on file    FAMILY HISTORY: Family History  Problem Relation Age of Onset   Diabetes Mother    Cancer Mother    Diabetes Father    Stroke Father  Heart attack Father        36s   Healthy Sister    Cancer Brother    Hepatitis Brother    Diabetes Brother    Hypertension Brother    Heart disease Brother     ALLERGIES:  has No Known Allergies.  MEDICATIONS:  Current Outpatient Medications  Medication Sig Dispense Refill   buPROPion (WELLBUTRIN SR) 150 MG 12 hr tablet Take 1 tablet (150 mg total) by mouth 2 (two) times daily. 60 tablet 0   lidocaine-prilocaine (EMLA) cream Apply one application topically the the affected area(s) daily as needed. 30 g 3   metFORMIN (GLUCOPHAGE) 500 MG tablet TAKE ONE TABLET BY MOUTH EVERY MORNING WITH BREAKFAST 30 tablet 3   morphine (MS CONTIN) 15 MG 12 hr tablet Take 1 tablet (15 mg total) by mouth every 12 (twelve) hours. 30 tablet 0   ondansetron (ZOFRAN) 4 MG tablet TAKE 2 TABLETS (8MG) BY MOUTH ONCE EVERY 8 HOURS AS NEEDED FOR NAUSEA OR VOMITING. 80 tablet 1   Oxycodone HCl 10 MG TABS Take 1 tablet (10 mg total) by mouth every 4 (four) hours as needed (pain). 60 tablet 0   prochlorperazine (COMPAZINE) 10 MG tablet Take 1 tablet (10 mg  total) by mouth once every 6 (six) hours as needed for nausea or vomiting. 40 tablet 1   albuterol (VENTOLIN HFA) 108 (90 Base) MCG/ACT inhaler Inhale 2 puffs into the lungs every 6 (six) hours as needed for wheezing or shortness of breath. (Patient not taking: Reported on 09/14/2021) 6.7 g 2   meloxicam (MOBIC) 15 MG tablet Take 1 tablet (15 mg total) by mouth daily. (Patient not taking: Reported on 09/14/2021) 30 tablet 0   rosuvastatin (CRESTOR) 10 MG tablet TAKE ONE TABLET BY MOUTH EVERY DAY (Patient not taking: Reported on 09/14/2021) 30 tablet 3   No current facility-administered medications for this visit.      Marland Kitchen  PHYSICAL EXAMINATION: ECOG PERFORMANCE STATUS: 0 - Asymptomatic  Vitals:   09/14/21 0851  BP: 110/75  Pulse: 78  Resp: 16  Temp: 97.6 F (36.4 C)  SpO2: 99%     Filed Weights   09/14/21 0851  Weight: 152 lb (68.9 kg)      Physical Exam HENT:     Head: Normocephalic and atraumatic.     Mouth/Throat:     Pharynx: No oropharyngeal exudate.  Eyes:     Pupils: Pupils are equal, round, and reactive to light.  Cardiovascular:     Rate and Rhythm: Normal rate and regular rhythm.  Pulmonary:     Effort: Pulmonary effort is normal. No respiratory distress.     Breath sounds: Normal breath sounds. No wheezing.  Abdominal:     General: Bowel sounds are normal. There is no distension.     Palpations: Abdomen is soft. There is no mass.     Tenderness: There is no abdominal tenderness. There is no guarding or rebound.  Musculoskeletal:        General: No tenderness. Normal range of motion.     Cervical back: Normal range of motion and neck supple.  Skin:    General: Skin is warm.  Neurological:     Mental Status: She is alert and oriented to person, place, and time.  Psychiatric:        Mood and Affect: Affect normal.     LABORATORY DATA:  I have reviewed the data as listed Lab Results  Component Value Date   WBC 7.2  09/14/2021   HGB 15.2 (H) 09/14/2021    HCT 45.0 09/14/2021   MCV 88.6 09/14/2021   PLT 315 09/14/2021   Recent Labs    11/26/20 1214 05/20/21 1343 05/22/21 1500 08/12/21 1750 09/14/21 0837  NA 139  --  139 136 138  K 4.1  --  4.1 4.4 3.7  CL 103  --  105 105 101  CO2 22  --  26 27 28   GLUCOSE 99  --  100* 99 113*  BUN 8  --  14 15 22*  CREATININE 0.69   < > 0.72 0.71 0.63  CALCIUM 9.5  --  9.8 8.8* 9.6  GFRNONAA 96  --  >60 >60 >60  GFRAA 110  --   --   --   --   PROT 6.9  --  8.0 7.0 7.7  ALBUMIN 4.0  --  3.7 3.6 4.0  AST 18  --  13* 17 13*  ALT 9  --  9 10 12   ALKPHOS 113  --  91 74 91  BILITOT 0.3  --  0.4 0.9 0.3   < > = values in this interval not displayed.    RADIOGRAPHIC STUDIES: I have personally reviewed the radiological images as listed and agreed with the findings in the report. NM PET Image Restage (PS) Skull Base to Thigh (F-18 FDG)  Result Date: 08/28/2021 CLINICAL DATA:  Subsequent treatment strategy for non-small cell lung cancer. RIGHT upper lobe lobectomy 09/19/2020 EXAM: NUCLEAR MEDICINE PET SKULL BASE TO THIGH TECHNIQUE: 8.4 mCi F-18 FDG was injected intravenously. Full-ring PET imaging was performed from the skull base to thigh after the radiotracer. CT data was obtained and used for attenuation correction and anatomic localization. Fasting blood glucose: 101 mg/dl COMPARISON:  PET-CT 08/20/2020 FINDINGS: Mediastinal blood pool activity: SUV max 2.6 Liver activity: SUV max NA NECK: No hypermetabolic lymph nodes in the neck. Incidental CT findings: none CHEST: Postsurgical change in the RIGHT hemithorax with RIGHT upper lobectomy and resection of the hypermetabolic nodule. No or enlarged nodules within the RIGHT lung. No LEFT lung lesion. No hypermetabolic mediastinal lymph nodes. Incidental CT findings: Coronary artery calcification and aortic atherosclerotic calcification. ABDOMEN/PELVIS: Within the posterior aspect of the LEFT hepatic lobe there is a new hypermetabolic lesion measuring 4.1 cm  with SUV max equal 10.5 on image 158 of the fused data set. Small lesion in the anterior subcapsular LEFT hepatic lobe measures 1.5 cm with SUV max equal 5.7 on image 152. Benign adenoma of the LEFT adrenal gland. Single hypermetabolic gastrohepatic ligament node on image 149 with SUV max equal 6.6. No pelvic adenopathy. Incidental CT findings: Uterus and adnexa unremarkable. SKELETON: Within the RIGHT scapula there is a hypermetabolic expansile mass measuring 2.6 x 2.7 cm with SUV max equal 10.9. This mass erupted through the cortex into the musculature. No additional skeletal metastasis Incidental CT findings: none IMPRESSION: 1. Unfortunately, there is evidence of multi organ lung cancer metastasis. 2. Two new hypermetabolic metastasis in LEFT hepatic lobe. 3. Single hypermetabolic gastrohepatic ligament lymph node. 4. 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 This large lesion erupts through the cortex into the adjacent musculature. These results will be called to the ordering clinician or representative by the Radiologist Assistant, and communication documented in the PACS or [employer]. Electronically Signed   By: Suzy Bouchard M.D.   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 PET 08/28/2021-metastatic disease noted in the liver/left hepatic lobe; hypermetabolic gastrohepatic lymph node; and also left scapular metastases with destruction of the bone [see below].  NGS negative for any targeted options at this time; PDL-1=0.  # Proceed with  chemo immunotherapy carbo etoposide-Tecentriq- cyle #1 today/this week.  Patient understand treatments are palliative not curative.  # Pain right shoulder- currenrly on RT until 12/07; continue oxycodone 10 mg every 6 hours as needed.  Holding off  meloxicam-?  Diplopia-see below.  Recommend MS Contin as ordered.  #Diplopia-intermittent; no other neurologic signs noted.  Question related to medication versus malignancy.  Recommend stat MRI of the brain.  Discussed the potential side effects including but not limited to-increasing fatigue, nausea vomiting, diarrhea, hair loss, sores in the mouth, increase risk of infection and also neuropathy.    I discussed the mechanism of action; The goal of therapy is palliative; and length of treatments are likely ongoing/based upon the results of the scans. Discussed the potential side effects of immunotherapy including but not limited to diarrhea; skin rash; elevated LFTs/endocrine abnormalities etc.  # pain control/scapular metastases right-s/p evaluation with Dr. Donella Stade- start RT 11/21 to 12/06.Continue Oxy 10 mg 1 pill every 6 hours; and also on prednisone 10 mg/day. Consider Zometa.   #IV access: pt reluctant with port placement; finally agrees.  # DISPOSITION: Chemo today STAT brain MRI this week 2 weeks - port lab, [place], poss IVF 1/3 - port lab, Dr. Jacinto Reap for [place], etop, tecentriq 1/4 and 1/5 - etop only -hr     All questions were answered. The patient knows to call the clinic with any problems, questions or concerns.    Cammie Sickle, MD 09/14/2021 9:40 AM

## 2021-09-14 NOTE — Patient Instructions (Signed)
[facility] CANCER CTR AT [place]  Discharge Instructions: Thank you for choosing [facility] to provide your oncology and hematology care.  If you have a lab appointment with the [facility], please go directly to the [facility] and check in at the registration area.  Wear comfortable clothing and clothing appropriate for easy access to any Portacath or PICC line.   We strive to give you quality time with your provider. You may need to reschedule your appointment if you arrive late (15 or more minutes).  Arriving late affects you and other patients whose appointments are after yours.  Also, if you miss three or more appointments without notifying the office, you may be dismissed from the clinic at the provider's discretion.      For prescription refill requests, have your pharmacy contact our office and allow 72 hours for refills to be completed.    Today you received the following chemotherapy and/or immunotherapy agents Tecentriq, Carboplatin, Etoposide    To help prevent nausea and vomiting after your treatment, we encourage you to take your nausea medication as directed.  BELOW ARE SYMPTOMS THAT SHOULD BE REPORTED IMMEDIATELY: *FEVER GREATER THAN 100.4 F (38 C) OR HIGHER *CHILLS OR SWEATING *NAUSEA AND VOMITING THAT IS NOT CONTROLLED WITH YOUR NAUSEA MEDICATION *UNUSUAL SHORTNESS OF BREATH *UNUSUAL BRUISING OR BLEEDING *URINARY PROBLEMS (pain or burning when urinating, or frequent urination) *BOWEL PROBLEMS (unusual diarrhea, constipation, pain near the anus) TENDERNESS IN MOUTH AND THROAT WITH OR WITHOUT PRESENCE OF ULCERS (sore throat, sores in mouth, or a toothache) UNUSUAL RASH, SWELLING OR PAIN  UNUSUAL VAGINAL DISCHARGE OR ITCHING   Items with * indicate a potential emergency and should be followed up as soon as possible or go to the Emergency Department if any problems should occur.  Please show the CHEMOTHERAPY ALERT CARD or IMMUNOTHERAPY ALERT  CARD at check-in to the Emergency Department and triage nurse.  Should you have questions after your visit or need to cancel or reschedule your appointment, please contact [facility] CANCER [place] AT [place]  [phone] and follow the prompts.  Office hours are 8:00 a.m. to 4:30 p.m. Monday - Friday. Please note that voicemails left after 4:00 p.m. may not be returned until the following business day.  We are closed weekends and major holidays. You have access to a nurse at all times for urgent questions. Please call the main number to the clinic [phone] and follow the prompts.  For any non-urgent questions, you may also contact your provider using MyChart. We now offer e-Visits for anyone 55 and older to request care online for non-urgent symptoms. For details visit [email or web address].   Also download the MyChart app! Go to the app store, search "MyChart", open the app, select , and log in with your MyChart username and password.  Due to Covid, a mask is required upon entering the hospital/clinic. If you do not have a mask, one will be given to you upon arrival. For doctor visits, patients may have 1 support person aged 81 or older with them. For treatment visits, patients cannot have anyone with them due to current Covid guidelines and our immunocompromised population.

## 2021-09-14 NOTE — Progress Notes (Signed)
Met with patient during follow up visit with Dr. Rogue Bussing prior to starting chemotherapy today. All questions answered during visit. Pt informed that has been approved for financial assistance grant. Advised to bring in water/electricity bills to turn in for payment and informed that can get gas cards at the front desk if needs assistance with gas. Also, informed that can assist with medications when needs a refill. Instructed to call when refill is needed to send prescription to local pharmacy and cost of medication will be covered by charitable funds. Reviewed upcoming appts in depth with patient and informed that she will be called with her appt for port placement. Pt verbalized understanding. Nothing further needed at this time.

## 2021-09-15 ENCOUNTER — Encounter: Payer: Self-pay | Admitting: Internal Medicine

## 2021-09-15 ENCOUNTER — Telehealth: Payer: Self-pay

## 2021-09-15 ENCOUNTER — Ambulatory Visit
Admission: RE | Admit: 2021-09-15 | Discharge: 2021-09-15 | Disposition: A | Discharge status: Home / Self Care | Payer: Medicaid Other | Source: Ambulatory Visit | Attending: Internal Medicine | Admitting: Internal Medicine

## 2021-09-15 ENCOUNTER — Ambulatory Visit
Admission: RE | Admit: 2021-09-15 | Discharge: 2021-09-15 | Disposition: A | Discharge status: Home / Self Care | Payer: Medicaid Other | Source: Ambulatory Visit | Attending: Radiation Oncology | Admitting: Radiation Oncology

## 2021-09-15 ENCOUNTER — Other Ambulatory Visit: Payer: Self-pay | Admitting: *Deleted

## 2021-09-15 ENCOUNTER — Other Ambulatory Visit: Payer: Self-pay

## 2021-09-15 ENCOUNTER — Ambulatory Visit: Payer: Medicaid Other

## 2021-09-15 ENCOUNTER — Inpatient Hospital Stay: Payer: Medicaid Other

## 2021-09-15 VITALS — BP 124/63 | HR 84 | Temp 96.4°F | Resp 18

## 2021-09-15 DIAGNOSIS — H532 Diplopia: Secondary | ICD-10-CM | POA: Diagnosis present

## 2021-09-15 DIAGNOSIS — C3411 Malignant neoplasm of upper lobe, right bronchus or lung: Secondary | ICD-10-CM | POA: Insufficient documentation

## 2021-09-15 DIAGNOSIS — Z510 Encounter for antineoplastic radiation therapy: Secondary | ICD-10-CM | POA: Diagnosis not present

## 2021-09-15 MED ORDER — ASPIRIN EC 81 MG PO TBEC: 81.0000 mg | DELAYED_RELEASE_TABLET | Freq: Every day | ORAL | 11 refills | Status: DC

## 2021-09-15 MED ORDER — SODIUM CHLORIDE 0.9 % IV SOLN
Freq: Once | INTRAVENOUS | Status: AC
  Filled 2021-09-15: qty 250

## 2021-09-15 MED ORDER — GADOBUTROL 1 MMOL/ML IV SOLN
7.0000 mL | Freq: Once | INTRAVENOUS | Status: AC | PRN
  Administered 2021-09-15: 7 mL via INTRAVENOUS

## 2021-09-15 MED ORDER — MORPHINE SULFATE ER 15 MG PO TBCR: 15.0000 mg | EXTENDED_RELEASE_TABLET | Freq: Two times a day (BID) | ORAL | 0 refills | Status: DC

## 2021-09-15 MED ORDER — SODIUM CHLORIDE 0.9 % IV SOLN
100.0000 mg/m2 | Freq: Once | INTRAVENOUS | Status: AC
  Administered 2021-09-15: 180 mg via INTRAVENOUS
  Filled 2021-09-15: qty 9

## 2021-09-15 MED ORDER — SODIUM CHLORIDE 0.9 % IV SOLN
10.0000 mg | Freq: Once | INTRAVENOUS | Status: AC
  Administered 2021-09-15: 10 mg via INTRAVENOUS
  Filled 2021-09-15: qty 10

## 2021-09-15 NOTE — Telephone Encounter (Signed)
Telephone call to patient for follow up after receiving first infusion.   No answer but left message stating we were calling to check on them.  Encouraged patient to call for any questions or concerns.   

## 2021-09-15 NOTE — Telephone Encounter (Signed)
Pt has not picked up prescription for morphine at [facility]. Recently approved for financial assistance grant and requests medication be sent to [facility]. Will cancel prescription at [facility] at this time.

## 2021-09-15 NOTE — Telephone Encounter (Signed)
09/15/21 @0930 : PC SW outreached patient to discuss and review needs.  Patient shared that her main concern at this time is that she has not heard anything on her disability application. She has done/submitted all of the required due outs per the disability office. Patient is aware that it can take up 24 months for a decision to be made.   Patient shared that she has received some financial assistance from the oncology office with gas cards, medications and was approved for a financial grant. Patient share that she would like to see/talk to the oncology SW again to apply for food stamps and medicaid. Patient is scheduled for radiation treatment today and states she will be at oncology office ll day. SW sent oncology SW, Gwinda Maine, a message with patients request.   SW provided patient with SW contact info should need to outreach SW in the future.

## 2021-09-15 NOTE — Patient Instructions (Signed)
[facility] CANCER CTR AT [place]  Discharge Instructions: Thank you for choosing [place] to provide your oncology and hematology care.  If you have a lab appointment with the [facility], please go directly to the [facility] and check in at the registration area.  Wear comfortable clothing and clothing appropriate for easy access to any Portacath or PICC line.   We strive to give you quality time with your provider. You may need to reschedule your appointment if you arrive late (15 or more minutes).  Arriving late affects you and other patients whose appointments are after yours.  Also, if you miss three or more appointments without notifying the office, you may be dismissed from the clinic at the provider's discretion.      For prescription refill requests, have your pharmacy contact our office and allow 72 hours for refills to be completed.    Today you received the following chemotherapy and/or immunotherapy agents etoposide       To help prevent nausea and vomiting after your treatment, we encourage you to take your nausea medication as directed.  BELOW ARE SYMPTOMS THAT SHOULD BE REPORTED IMMEDIATELY: *FEVER GREATER THAN 100.4 F (38 C) OR HIGHER *CHILLS OR SWEATING *NAUSEA AND VOMITING THAT IS NOT CONTROLLED WITH YOUR NAUSEA MEDICATION *UNUSUAL SHORTNESS OF BREATH *UNUSUAL BRUISING OR BLEEDING *URINARY PROBLEMS (pain or burning when urinating, or frequent urination) *BOWEL PROBLEMS (unusual diarrhea, constipation, pain near the anus) TENDERNESS IN MOUTH AND THROAT WITH OR WITHOUT PRESENCE OF ULCERS (sore throat, sores in mouth, or a toothache) UNUSUAL RASH, SWELLING OR PAIN  UNUSUAL VAGINAL DISCHARGE OR ITCHING   Items with * indicate a potential emergency and should be followed up as soon as possible or go to the Emergency Department if any problems should occur.  Please show the CHEMOTHERAPY ALERT CARD or IMMUNOTHERAPY ALERT CARD at check-in to  the Emergency Department and triage nurse.  Should you have questions after your visit or need to cancel or reschedule your appointment, please contact [facility] CANCER [place] AT [place]  [phone] and follow the prompts.  Office hours are 8:00 a.m. to 4:30 p.m. Monday - Friday. Please note that voicemails left after 4:00 p.m. may not be returned until the following business day.  We are closed weekends and major holidays. You have access to a nurse at all times for urgent questions. Please call the main number to the clinic [phone] and follow the prompts.  For any non-urgent questions, you may also contact your provider using MyChart. We now offer e-Visits for anyone 45 and older to request care online for non-urgent symptoms. For details visit [email or web address].   Also download the MyChart app! Go to the app store, search "MyChart", open the app, select [place], and log in with your MyChart username and password.  Due to Covid, a mask is required upon entering the hospital/clinic. If you do not have a mask, one will be given to you upon arrival. For doctor visits, patients may have 1 support person aged 20 or older with them. For treatment visits, patients cannot have anyone with them due to current Covid guidelines and our immunocompromised population.  Etoposide, VP-16 injection What is this medication? ETOPOSIDE, VP-16 (e toe POE side) is a chemotherapy drug. It is used to treat testicular cancer, lung cancer, and other cancers. This medicine may be used for other purposes; ask your health care provider or pharmacist if you have questions. COMMON BRAND NAME(S): Etopophos, Toposar, VePesid What  should I tell my care team before I take this medication? They need to know if you have any of these conditions: infection kidney disease liver disease low blood counts, like low white cell, platelet, or red cell counts an unusual or allergic reaction to etoposide, other  medicines, foods, dyes, or preservatives pregnant or trying to get pregnant breast-feeding How should I use this medication? This medicine is for infusion into a vein. It is administered in a hospital or clinic by a specially trained health care professional. Talk to your pediatrician regarding the use of this medicine in children. Special care may be needed. Overdosage: If you think you have taken too much of this medicine contact a poison control center or emergency room at once. NOTE: This medicine is only for you. Do not share this medicine with others. What if I miss a dose? It is important not to miss your dose. Call your doctor or health care professional if you are unable to keep an appointment. What may interact with this medication? This medicine may interact with the following medications: warfarin This list may not describe all possible interactions. Give your health care provider a list of all the medicines, herbs, non-prescription drugs, or dietary supplements you use. Also tell them if you smoke, drink alcohol, or use illegal drugs. Some items may interact with your medicine. What should I watch for while using this medication? Visit your doctor for checks on your progress. This drug may make you feel generally unwell. This is not uncommon, as chemotherapy can affect healthy cells as well as cancer cells. Report any side effects. Continue your course of treatment even though you feel ill unless your doctor tells you to stop. In some cases, you may be given additional medicines to help with side effects. Follow all directions for their use. Call your doctor or health care professional for advice if you get a fever, chills or sore throat, or other symptoms of a cold or flu. Do not treat yourself. This drug decreases your body's ability to fight infections. Try to avoid being around people who are sick. This medicine may increase your risk to bruise or bleed. Call your doctor or health  care professional if you notice any unusual bleeding. Talk to your doctor about your risk of cancer. You may be more at risk for certain types of cancers if you take this medicine. Do not become pregnant while taking this medicine or for at least 6 months after stopping it. Women should inform their doctor if they wish to become pregnant or think they might be pregnant. Women of child-bearing potential will need to have a negative pregnancy test before starting this medicine. There is a potential for serious side effects to an unborn child. Talk to your health care professional or pharmacist for more information. Do not breast-feed an infant while taking this medicine. Men must use a latex condom during sexual contact with a woman while taking this medicine and for at least 4 months after stopping it. A latex condom is needed even if you have had a vasectomy. Contact your doctor right away if your partner becomes pregnant. Do not donate sperm while taking this medicine and for at least 4 months after you stop taking this medicine. Men should inform their doctors if they wish to father a child. This medicine may lower sperm counts. What side effects may I notice from receiving this medication? Side effects that you should report to your doctor or health care professional  as soon as possible: allergic reactions like skin rash, itching or hives, swelling of the face, lips, or tongue low blood counts - this medicine may decrease the number of white blood cells, red blood cells, and platelets. You may be at increased risk for infections and bleeding nausea, vomiting redness, blistering, peeling or loosening of the skin, including inside the mouth signs and symptoms of infection like fever; chills; cough; sore throat; pain or trouble passing urine signs and symptoms of low red blood cells or anemia such as unusually weak or tired; feeling faint or lightheaded; falls; breathing problems unusual bruising or  bleeding Side effects that usually do not require medical attention (report to your doctor or health care professional if they continue or are bothersome): changes in taste diarrhea hair loss loss of appetite mouth sores This list may not describe all possible side effects. Call your doctor for medical advice about side effects. You may report side effects to FDA at 1-800-FDA-1088. Where should I keep my medication? This drug is given in a hospital or clinic and will not be stored at home. NOTE: This sheet is a summary. It may not cover all possible information. If you have questions about this medicine, talk to your doctor, pharmacist, or health care provider.  2022 Elsevier/Gold Standard (2021-06-16 00:00:00)

## 2021-09-16 ENCOUNTER — Inpatient Hospital Stay: Payer: Medicaid Other

## 2021-09-16 ENCOUNTER — Other Ambulatory Visit: Payer: Self-pay

## 2021-09-16 ENCOUNTER — Ambulatory Visit
Admission: RE | Admit: 2021-09-16 | Discharge: 2021-09-16 | Disposition: A | Discharge status: Home / Self Care | Payer: Medicaid Other | Source: Ambulatory Visit | Attending: Radiation Oncology | Admitting: Radiation Oncology

## 2021-09-16 ENCOUNTER — Encounter: Payer: Self-pay | Admitting: Internal Medicine

## 2021-09-16 VITALS — BP 127/73 | Temp 97.0°F | Resp 16

## 2021-09-16 DIAGNOSIS — C3411 Malignant neoplasm of upper lobe, right bronchus or lung: Secondary | ICD-10-CM

## 2021-09-16 DIAGNOSIS — Z510 Encounter for antineoplastic radiation therapy: Secondary | ICD-10-CM | POA: Diagnosis not present

## 2021-09-16 MED ORDER — SODIUM CHLORIDE 0.9 % IV SOLN
10.0000 mg | Freq: Once | INTRAVENOUS | Status: AC
  Administered 2021-09-16: 10 mg via INTRAVENOUS
  Filled 2021-09-16: qty 10

## 2021-09-16 MED ORDER — SODIUM CHLORIDE 0.9 % IV SOLN
100.0000 mg/m2 | Freq: Once | INTRAVENOUS | Status: AC
  Administered 2021-09-16: 180 mg via INTRAVENOUS
  Filled 2021-09-16: qty 9

## 2021-09-16 MED ORDER — SODIUM CHLORIDE 0.9 % IV SOLN
Freq: Once | INTRAVENOUS | Status: AC
  Filled 2021-09-16: qty 250

## 2021-09-16 NOTE — Patient Instructions (Signed)
[facility] CANCER CTR AT [employer]  Discharge Instructions: Thank you for choosing [place] to provide your oncology and hematology care.  If you have a lab appointment with the [facility], please go directly to the [facility] and check in at the registration area.  Wear comfortable clothing and clothing appropriate for easy access to any Portacath or PICC line.   We strive to give you quality time with your provider. You may need to reschedule your appointment if you arrive late (15 or more minutes).  Arriving late affects you and other patients whose appointments are after yours.  Also, if you miss three or more appointments without notifying the office, you may be dismissed from the clinic at the provider's discretion.      For prescription refill requests, have your pharmacy contact our office and allow 72 hours for refills to be completed.    Today you received the following chemotherapy and/or immunotherapy agents - etoposide      To help prevent nausea and vomiting after your treatment, we encourage you to take your nausea medication as directed.  BELOW ARE SYMPTOMS THAT SHOULD BE REPORTED IMMEDIATELY: *FEVER GREATER THAN 100.4 F (38 C) OR HIGHER *CHILLS OR SWEATING *NAUSEA AND VOMITING THAT IS NOT CONTROLLED WITH YOUR NAUSEA MEDICATION *UNUSUAL SHORTNESS OF BREATH *UNUSUAL BRUISING OR BLEEDING *URINARY PROBLEMS (pain or burning when urinating, or frequent urination) *BOWEL PROBLEMS (unusual diarrhea, constipation, pain near the anus) TENDERNESS IN MOUTH AND THROAT WITH OR WITHOUT PRESENCE OF ULCERS (sore throat, sores in mouth, or a toothache) UNUSUAL RASH, SWELLING OR PAIN  UNUSUAL VAGINAL DISCHARGE OR ITCHING   Items with * indicate a potential emergency and should be followed up as soon as possible or go to the Emergency Department if any problems should occur.  Please show the CHEMOTHERAPY ALERT CARD or IMMUNOTHERAPY ALERT CARD at check-in to  the Emergency Department and triage nurse.  Should you have questions after your visit or need to cancel or reschedule your appointment, please contact [facility] CANCER [place] AT [place]  [phone] and follow the prompts.  Office hours are 8:00 a.m. to 4:30 p.m. Monday - Friday. Please note that voicemails left after 4:00 p.m. may not be returned until the following business day.  We are closed weekends and major holidays. You have access to a nurse at all times for urgent questions. Please call the main number to the clinic [phone] and follow the prompts.  For any non-urgent questions, you may also contact your provider using MyChart. We now offer e-Visits for anyone 60 and older to request care online for non-urgent symptoms. For details visit [email or web address].   Also download the MyChart app! Go to the app store, search "MyChart", open the app, select [place], and log in with your MyChart username and password.  Due to Covid, a mask is required upon entering the hospital/clinic. If you do not have a mask, one will be given to you upon arrival. For doctor visits, patients may have 1 support person aged 60 or older with them. For treatment visits, patients cannot have anyone with them due to current Covid guidelines and our immunocompromised population.   Etoposide, VP-16 injection What is this medication? ETOPOSIDE, VP-16 (e toe POE side) is a chemotherapy drug. It is used to treat testicular cancer, lung cancer, and other cancers. This medicine may be used for other purposes; ask your health care provider or pharmacist if you have questions. COMMON BRAND NAME(S): Etopophos, Toposar, VePesid  What should I tell my care team before I take this medication? They need to know if you have any of these conditions: infection kidney disease liver disease low blood counts, like low white cell, platelet, or red cell counts an unusual or allergic reaction to etoposide, other  medicines, foods, dyes, or preservatives pregnant or trying to get pregnant breast-feeding How should I use this medication? This medicine is for infusion into a vein. It is administered in a hospital or clinic by a specially trained health care professional. Talk to your pediatrician regarding the use of this medicine in children. Special care may be needed. Overdosage: If you think you have taken too much of this medicine contact a poison control center or emergency room at once. NOTE: This medicine is only for you. Do not share this medicine with others. What if I miss a dose? It is important not to miss your dose. Call your doctor or health care professional if you are unable to keep an appointment. What may interact with this medication? This medicine may interact with the following medications: warfarin This list may not describe all possible interactions. Give your health care provider a list of all the medicines, herbs, non-prescription drugs, or dietary supplements you use. Also tell them if you smoke, drink alcohol, or use illegal drugs. Some items may interact with your medicine. What should I watch for while using this medication? Visit your doctor for checks on your progress. This drug may make you feel generally unwell. This is not uncommon, as chemotherapy can affect healthy cells as well as cancer cells. Report any side effects. Continue your course of treatment even though you feel ill unless your doctor tells you to stop. In some cases, you may be given additional medicines to help with side effects. Follow all directions for their use. Call your doctor or health care professional for advice if you get a fever, chills or sore throat, or other symptoms of a cold or flu. Do not treat yourself. This drug decreases your body's ability to fight infections. Try to avoid being around people who are sick. This medicine may increase your risk to bruise or bleed. Call your doctor or health  care professional if you notice any unusual bleeding. Talk to your doctor about your risk of cancer. You may be more at risk for certain types of cancers if you take this medicine. Do not become pregnant while taking this medicine or for at least 6 months after stopping it. Women should inform their doctor if they wish to become pregnant or think they might be pregnant. Women of child-bearing potential will need to have a negative pregnancy test before starting this medicine. There is a potential for serious side effects to an unborn child. Talk to your health care professional or pharmacist for more information. Do not breast-feed an infant while taking this medicine. Men must use a latex condom during sexual contact with a woman while taking this medicine and for at least 4 months after stopping it. A latex condom is needed even if you have had a vasectomy. Contact your doctor right away if your partner becomes pregnant. Do not donate sperm while taking this medicine and for at least 4 months after you stop taking this medicine. Men should inform their doctors if they wish to father a child. This medicine may lower sperm counts. What side effects may I notice from receiving this medication? Side effects that you should report to your doctor or health care  professional as soon as possible: allergic reactions like skin rash, itching or hives, swelling of the face, lips, or tongue low blood counts - this medicine may decrease the number of white blood cells, red blood cells, and platelets. You may be at increased risk for infections and bleeding nausea, vomiting redness, blistering, peeling or loosening of the skin, including inside the mouth signs and symptoms of infection like fever; chills; cough; sore throat; pain or trouble passing urine signs and symptoms of low red blood cells or anemia such as unusually weak or tired; feeling faint or lightheaded; falls; breathing problems unusual bruising or  bleeding Side effects that usually do not require medical attention (report to your doctor or health care professional if they continue or are bothersome): changes in taste diarrhea hair loss loss of appetite mouth sores This list may not describe all possible side effects. Call your doctor for medical advice about side effects. You may report side effects to FDA at 1-800-FDA-1088. Where should I keep my medication? This drug is given in a hospital or clinic and will not be stored at home. NOTE: This sheet is a summary. It may not cover all possible information. If you have questions about this medicine, talk to your doctor, pharmacist, or health care provider.  2022 Elsevier/Gold Standard (2021-06-16 00:00:00)

## 2021-09-17 ENCOUNTER — Telehealth: Payer: Self-pay | Admitting: Nurse Practitioner

## 2021-09-17 ENCOUNTER — Inpatient Hospital Stay: Payer: Medicaid Other | Admitting: Licensed Clinical Social Worker

## 2021-09-22 ENCOUNTER — Other Ambulatory Visit: Payer: Self-pay | Admitting: Radiology

## 2021-09-23 ENCOUNTER — Other Ambulatory Visit: Payer: Self-pay

## 2021-09-23 ENCOUNTER — Ambulatory Visit
Admission: RE | Admit: 2021-09-23 | Discharge: 2021-09-23 | Disposition: A | Discharge status: Home / Self Care | Payer: Medicaid Other | Source: Ambulatory Visit | Attending: Internal Medicine | Admitting: Internal Medicine

## 2021-09-23 ENCOUNTER — Encounter: Payer: Self-pay | Admitting: Radiology

## 2021-09-23 DIAGNOSIS — F17210 Nicotine dependence, cigarettes, uncomplicated: Secondary | ICD-10-CM | POA: Diagnosis not present

## 2021-09-23 DIAGNOSIS — C3411 Malignant neoplasm of upper lobe, right bronchus or lung: Secondary | ICD-10-CM | POA: Insufficient documentation

## 2021-09-23 DIAGNOSIS — Z902 Acquired absence of lung [part of]: Secondary | ICD-10-CM | POA: Diagnosis not present

## 2021-09-23 HISTORY — PX: IR IMAGING GUIDED PORT INSERTION: IMG5740

## 2021-09-23 MED ORDER — FENTANYL CITRATE (PF) 100 MCG/2ML IJ SOLN
INTRAMUSCULAR | Status: AC | PRN
Start: 2021-09-23 — End: 2021-09-23
  Administered 2021-09-23 (×2): 50 ug via INTRAVENOUS

## 2021-09-23 MED ORDER — HEPARIN SOD (PORK) LOCK FLUSH 100 UNIT/ML IV SOLN
INTRAVENOUS | Status: AC
  Administered 2021-09-23: 11:00:00 500 [IU]
  Filled 2021-09-23: qty 5

## 2021-09-23 MED ORDER — LIDOCAINE-EPINEPHRINE 1 %-1:100000 IJ SOLN
INTRAMUSCULAR | Status: AC
  Administered 2021-09-23: 11:00:00 20 mL
  Filled 2021-09-23: qty 1

## 2021-09-23 MED ORDER — SODIUM CHLORIDE 0.9 % IV SOLN: INTRAVENOUS | Status: DC

## 2021-09-23 MED ORDER — FENTANYL CITRATE (PF) 100 MCG/2ML IJ SOLN
INTRAMUSCULAR | Status: AC
  Filled 2021-09-23: qty 2

## 2021-09-23 MED ORDER — MIDAZOLAM HCL 2 MG/2ML IJ SOLN
INTRAMUSCULAR | Status: AC
  Filled 2021-09-23: qty 2

## 2021-09-23 MED ORDER — MIDAZOLAM HCL 2 MG/2ML IJ SOLN
INTRAMUSCULAR | Status: AC | PRN
  Administered 2021-09-23 (×2): 1 mg via INTRAVENOUS

## 2021-09-23 NOTE — H&P (Signed)
Chief Complaint: Patient was seen in consultation today for recurrent non small cell lung cancer at the request of Brahmanday,Govinda R  Referring Physician(s): Cammie Sickle  Supervising Physician: Arne Cleveland  Patient Status: [facility] - Out-pt  History of Present Illness: Stacie Kennedy is a 60 y.o. 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 The patient has been seen by Oncology 12/5 with no medical changes since visit. She is here today for scheduled elective outpatient image guided port a catheter placement with moderate sedation.  The patient has had a H&P performed within the last 30 days, all history, medications, and exam have been reviewed. The patient denies any interval changes since the H&P.  The patient denies any chest pain or shortness of breath. She denies any recent blood thinner use. The patient denies any recent infections, fever or chills. The patient denies any history of sleep apnea or chronic oxygen use. She has no known complications to sedation.    Past Medical History:  Diagnosis Date   Cancer [facility])    lung cancer   Depression    Duodenitis    Dyspnea    Family history of cancer    High cholesterol    Personal history of colonic polyps    Pre-diabetes    Umbilical hernia    UTI (urinary tract infection)     Past Surgical History:  Procedure Laterality Date   COLONOSCOPY WITH PROPOFOL N/A 03/24/2021   Procedure: COLONOSCOPY WITH PROPOFOL;  Surgeon: Jonathon Bellows, MD;  Location: [facility] ENDOSCOPY;  Service: Gastroenterology;  Laterality: N/A;   INTERCOSTAL NERVE BLOCK  09/19/2020   Procedure: INTERCOSTAL NERVE BLOCK;  Surgeon: Melrose Nakayama, MD;  Location: [facility];  Service: Thoracic;;   LUNG LOBECTOMY Right    LUNG REMOVAL, PARTIAL Right 09/19/2020   NODE DISSECTION Right 09/19/2020   Procedure: NODE DISSECTION;   Surgeon: Melrose Nakayama, MD;  Location: [place];  Service: Thoracic;  Laterality: Right;   VIDEO BRONCHOSCOPY N/A 07/08/2021   Procedure: VIDEO BRONCHOSCOPY;  Surgeon: Melrose Nakayama, MD;  Location: [facility] OR;  Service: Thoracic;  Laterality: N/A;    Allergies: Patient has no known allergies.  Medications: Prior to Admission medications   Medication Sig Start Date End Date Taking? Authorizing Provider  albuterol (VENTOLIN HFA) 108 (90 Base) MCG/ACT inhaler Inhale 2 puffs into the lungs every 6 (six) hours as needed for wheezing or shortness of breath. 07/14/21  Yes Iloabachie, Chioma E, NP  aspirin EC 81 MG tablet Take 1 tablet (81 mg total) by mouth daily. Swallow whole. 09/15/21  Yes Cammie Sickle, MD  buPROPion (WELLBUTRIN SR) 150 MG 12 hr tablet Take 1 tablet (150 mg total) by mouth 2 (two) times daily. 06/23/21  Yes Iloabachie, Chioma E, NP  metFORMIN (GLUCOPHAGE) 500 MG tablet TAKE ONE TABLET BY MOUTH EVERY MORNING WITH BREAKFAST 07/14/21 07/14/22 Yes Iloabachie, Chioma E, NP  morphine (MS CONTIN) 15 MG 12 hr tablet Take 1 tablet (15 mg total) by mouth every 12 (twelve) hours. 09/15/21  Yes Borders, Kirt Boys, NP  Oxycodone HCl 10 MG TABS Take 1 tablet (10 mg total) by mouth every 4 (four) hours as needed (pain). 09/01/21  Yes Borders, Kirt Boys, NP  lidocaine-prilocaine (EMLA) cream Apply one application topically the the affected area(s) daily as needed. 08/31/21   Cammie Sickle, MD  meloxicam (MOBIC) 15 MG tablet Take 1 tablet (15 mg total) by  mouth daily. Patient not taking: Reported on 09/14/2021 09/10/21   Borders, Kirt Boys, NP  ondansetron (ZOFRAN) 4 MG tablet TAKE 2 TABLETS (8MG ) BY MOUTH ONCE EVERY 8 HOURS AS NEEDED FOR NAUSEA OR VOMITING. 08/31/21   Cammie Sickle, MD  prochlorperazine (COMPAZINE) 10 MG tablet Take 1 tablet (10 mg total) by mouth once every 6 (six) hours as needed for nausea or vomiting. 08/31/21   Cammie Sickle, MD  rosuvastatin  (CRESTOR) 10 MG tablet TAKE ONE TABLET BY MOUTH EVERY DAY Patient not taking: Reported on 09/14/2021 07/14/21 07/14/22  Langston Reusing, NP     Family History  Problem Relation Age of Onset   Diabetes Mother    Cancer Mother    Diabetes Father    Stroke Father    Heart attack Father        31s   Healthy Sister    Cancer Brother    Hepatitis Brother    Diabetes Brother    Hypertension Brother    Heart disease Brother     Social History   Socioeconomic History   Marital status: Single    Spouse name: Not on file   Number of children: Not on file   Years of education: Not on file   Highest education level: Not on file  Occupational History   Not on file  Tobacco Use   Smoking status: Some Days    Packs/day: 0.25    Years: 43.00    Pack years: 10.75    Types: Cigarettes   Smokeless tobacco: Never   Tobacco comments:    states she is slowing, trying to quit  Vaping Use   Vaping Use: Never used  Substance and Sexual Activity   Alcohol use: Yes    Alcohol/week: 2.0 standard drinks    Types: 2 Cans of beer per week    Comment: occasional   Drug use: No   Sexual activity: Not on file  Other Topics Concern   Not on file  Social History Narrative   Lives in [place]; smokes; now and then beer; with boy friend. 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    Social Determinants of Health   Financial Resource Strain: Not on file  Food Insecurity: Food Insecurity Present   Worried About [place] in the Last Year: Sometimes true   Ran Out of Food in the Last Year: Sometimes true  Transportation Needs: No Transportation Needs   Lack of Transportation (Medical): No   Lack of Transportation (Non-Medical): No  Physical Activity: Insufficiently Active   Days of Exercise per Week: 7 days   Minutes of Exercise per Session: 20 min  Stress: Stress Concern Present   Feeling of Stress : To some extent  Social Connections: Socially Isolated   Frequency  of Communication with Friends and Family: Never   Frequency of Social Gatherings with Friends and Family: Never   Attends Religious Services: Never   Marine scientist or Organizations: No   Attends Music therapist: Never   Marital Status: Never married   Review of Systems: A 12 point ROS discussed and pertinent positives are indicated in the HPI above.  All other systems are negative.  Review of Systems  Vital Signs: BP 117/75    Pulse 69    Temp 97.7 F (36.5 C)    Resp 12    SpO2 98%   Physical Exam Constitutional:      Appearance: Normal appearance.  HENT:  Head: Normocephalic and atraumatic.  Cardiovascular:     Rate and Rhythm: Normal rate and regular rhythm.  Pulmonary:     Effort: Pulmonary effort is normal. No respiratory distress.     Breath sounds: Wheezing present.  Neurological:     Mental Status: She is alert and oriented to person, place, and time.    Imaging: MR Brain W Wo Contrast  Result Date: 09/15/2021 CLINICAL DATA:  Diplopia metastatic lung cancer EXAM: MRI HEAD WITHOUT AND WITH CONTRAST TECHNIQUE: Multiplanar, multiecho pulse sequences of the brain and surrounding structures were obtained without and with intravenous contrast. CONTRAST:  21mL GADAVIST GADOBUTROL 1 MMOL/ML IV SOLN COMPARISON:  None. FINDINGS: Brain: Punctate focus of nonenhancing DWI hyperintensity in the high left parietal cortex (series 5, image 30; series 7, image 11). No mass effect or edema. No definite ADC correlate or enhancement. No abnormal enhancement in the remainder of brain. Remote small bilateral cerebellar infarcts. No extra-axial fluid collection, abnormal mass effect, or midline shift. No hydrocephalus. No acute hemorrhage Vascular: Major arterial flow voids are maintained skull base. Skull and upper cervical spine: Normal marrow signal. Sinuses/Orbits: Mild-to-moderate paranasal sinus mucosal thickening. Other: No mastoid effusions IMPRESSION: 1. Punctate  focus of nonenhancing DWI hyperintensity in the high left parietal cortex without mass effect or edema. This could represent a tiny acute/subacute infarct or artifact. Although thought less likely, tiny nonenhancing metastatic lesion is not excluded in the setting of known metastatic lung cancer. Follow-up MRI with contrast in 3-4 weeks is recommended to ensure expected evolution/resolution. 2. Remote small bilateral cerebellar infarcts. Electronically Signed   By: Margaretha Sheffield M.D.   On: 09/15/2021 11:50   NM PET Image Restage (PS) Skull Base to Thigh (F-18 FDG)  Result Date: 08/28/2021 CLINICAL DATA:  Subsequent treatment strategy for non-small cell lung cancer. RIGHT upper lobe lobectomy 09/19/2020 EXAM: NUCLEAR MEDICINE PET SKULL BASE TO THIGH TECHNIQUE: 8.4 mCi F-18 FDG was injected intravenously. Full-ring PET imaging was performed from the skull base to thigh after the radiotracer. CT data was obtained and used for attenuation correction and anatomic localization. Fasting blood glucose: 101 mg/dl COMPARISON:  PET-CT 08/20/2020 FINDINGS: Mediastinal blood pool activity: SUV max 2.6 Liver activity: SUV max NA NECK: No hypermetabolic lymph nodes in the neck. Incidental CT findings: none CHEST: Postsurgical change in the RIGHT hemithorax with RIGHT upper lobectomy and resection of the hypermetabolic nodule. No or enlarged nodules within the RIGHT lung. No LEFT lung lesion. No hypermetabolic mediastinal lymph nodes. Incidental CT findings: Coronary artery calcification and aortic atherosclerotic calcification. ABDOMEN/PELVIS: Within the posterior aspect of the LEFT hepatic lobe there is a new hypermetabolic lesion measuring 4.1 cm with SUV max equal 10.5 on image 158 of the fused data set. Small lesion in the anterior subcapsular LEFT hepatic lobe measures 1.5 cm with SUV max equal 5.7 on image 152. Benign adenoma of the LEFT adrenal gland. Single hypermetabolic gastrohepatic ligament node on image 149  with SUV max equal 6.6. No pelvic adenopathy. Incidental CT findings: Uterus and adnexa unremarkable. SKELETON: Within the RIGHT scapula there is a hypermetabolic expansile mass measuring 2.6 x 2.7 cm with SUV max equal 10.9. This mass erupted through the cortex into the musculature. No additional skeletal metastasis Incidental CT findings: none IMPRESSION: 1. Unfortunately, there is evidence of multi organ lung cancer metastasis. 2. Two new hypermetabolic metastasis in LEFT hepatic lobe. 3. Single hypermetabolic gastrohepatic ligament lymph node. 4. 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 This large lesion erupts through the  cortex into the adjacent musculature. These results will be called to the ordering clinician or representative by the Radiologist Assistant, and communication documented in the PACS or [employer]. Electronically Signed   By: Suzy Bouchard M.D.   On: 08/28/2021 10:19    Labs:  CBC: Recent Labs    11/26/20 1214 05/22/21 1500 08/12/21 1633 09/14/21 0837  WBC 6.9 6.4 6.6 7.2  HGB 15.5 15.7* 14.4 15.2*  HCT 47.6* 46.9* 44.0 45.0  PLT 317 409* 323 315    COAGS: No results for input(s): INR, APTT in the last 8760 hours.  BMP: Recent Labs    11/26/20 1214 05/20/21 1343 05/22/21 1500 08/12/21 1750 09/14/21 0837  NA 139  --  139 136 138  K 4.1  --  4.1 4.4 3.7  CL 103  --  105 105 101  CO2 22  --  26 27 28   GLUCOSE 99  --  100* 99 113*  BUN 8  --  14 15 22*  CALCIUM 9.5  --  9.8 8.8* 9.6  CREATININE 0.69 0.50 0.72 0.71 0.63  GFRNONAA 96  --  >60 >60 >60  GFRAA 110  --   --   --   --     LIVER FUNCTION TESTS: Recent Labs    11/26/20 1214 05/22/21 1500 08/12/21 1750 09/14/21 0837  BILITOT 0.3 0.4 0.9 0.3  AST 18 13* 17 13*  ALT 9 9 10 12   ALKPHOS 113 91 74 91  PROT 6.9 8.0 7.0 7.7  ALBUMIN 4.0 3.7 3.6 4.0   Assessment and Plan: 60 year old female with PMHx of non small cell lung cancer-mixed predominant large cel  neuroendocrine;adenocarcinoma s/p RUL lobectomy, now with recurrent metastatic disease. The patient has been seen by Oncology 12/5 with no medical changes since visit. She is here today for scheduled elective outpatient image guided port a catheter placement with moderate sedation.  The patient has been NPO, no blood thinners taken, labs and vitals have been reviewed.  Risks and benefits of image guided port-a-catheter placement was discussed with the patient including, but not limited to bleeding, infection, pneumothorax, or fibrin sheath development and need for additional procedures.  All of the patient's questions were answered, patient is agreeable to proceed. Consent signed and in chart.   Thank you for this interesting consult.  I greatly enjoyed meeting Stacie Kennedy and look forward to participating in their care.  A copy of this report was sent to the requesting provider on this date.  Electronically Signed: Hedy Jacob, PA-C 09/23/2021, 9:28 AM   I spent a total of 15 Minutes  in face to face in clinical consultation, greater than 50% of which was counseling/coordinating care for recurrent non small cell lung cancer.

## 2021-09-23 NOTE — Progress Notes (Signed)
Patient clinically stable post Port placement per Dr Vernard Gambles, tolerated well. Vitals stable pre and post procedure. Received Versed 2 mg along with Fentanyl 100 mcg IV for procedure. Report given to [facility] post procedure/specials.

## 2021-09-23 NOTE — Procedures (Signed)
°  Procedure: R IJ PowerPort placement   EBL:   minimal Complications:  none immediate  See full dictation in [facility].  Dillard Cannon MD Main # [phone] Pager  [phone] Mobile [phone]

## 2021-09-24 ENCOUNTER — Other Ambulatory Visit: Payer: Self-pay | Admitting: *Deleted

## 2021-09-24 MED ORDER — MORPHINE SULFATE ER 15 MG PO TBCR: 15.0000 mg | EXTENDED_RELEASE_TABLET | Freq: Three times a day (TID) | ORAL | 0 refills | Status: DC

## 2021-09-24 MED ORDER — OXYCODONE HCL 10 MG PO TABS: 10.0000 mg | ORAL_TABLET | ORAL | 0 refills | Status: DC | PRN

## 2021-09-28 ENCOUNTER — Inpatient Hospital Stay: Payer: Medicaid Other

## 2021-09-28 ENCOUNTER — Inpatient Hospital Stay (HOSPITAL_BASED_OUTPATIENT_CLINIC_OR_DEPARTMENT_OTHER): Payer: Medicaid Other | Admitting: Hospice and Palliative Medicine

## 2021-09-28 ENCOUNTER — Other Ambulatory Visit: Payer: Self-pay

## 2021-09-28 ENCOUNTER — Encounter: Payer: Self-pay | Admitting: *Deleted

## 2021-09-28 VITALS — BP 104/69 | HR 78 | Temp 97.7°F | Resp 18

## 2021-09-28 DIAGNOSIS — C3411 Malignant neoplasm of upper lobe, right bronchus or lung: Secondary | ICD-10-CM | POA: Diagnosis not present

## 2021-09-28 DIAGNOSIS — T451X5A Adverse effect of antineoplastic and immunosuppressive drugs, initial encounter: Secondary | ICD-10-CM | POA: Diagnosis not present

## 2021-09-28 DIAGNOSIS — D701 Agranulocytosis secondary to cancer chemotherapy: Secondary | ICD-10-CM

## 2021-09-28 DIAGNOSIS — Z510 Encounter for antineoplastic radiation therapy: Secondary | ICD-10-CM | POA: Diagnosis not present

## 2021-09-28 LAB — CBC WITH DIFFERENTIAL/PLATELET
Abs Immature Granulocytes: 0 10*3/uL (ref 0.00–0.07)
Basophils Absolute: 0 10*3/uL (ref 0.0–0.1)
Basophils Relative: 1 %
Eosinophils Absolute: 0 10*3/uL (ref 0.0–0.5)
Eosinophils Relative: 1 %
HCT: 41.7 % (ref 36.0–46.0)
Hemoglobin: 14.1 g/dL (ref 12.0–15.0)
Immature Granulocytes: 0 %
Lymphocytes Relative: 65 %
Lymphs Abs: 1.7 10*3/uL (ref 0.7–4.0)
MCH: 29.3 pg (ref 26.0–34.0)
MCHC: 33.8 g/dL (ref 30.0–36.0)
MCV: 86.5 fL (ref 80.0–100.0)
Monocytes Absolute: 0.3 10*3/uL (ref 0.1–1.0)
Monocytes Relative: 13 %
Neutro Abs: 0.5 10*3/uL — ABNORMAL LOW (ref 1.7–7.7)
Neutrophils Relative %: 20 %
Platelets: 248 10*3/uL (ref 150–400)
RBC: 4.82 MIL/uL (ref 3.87–5.11)
RDW: 15 % (ref 11.5–15.5)
WBC: 2.6 10*3/uL — ABNORMAL LOW (ref 4.0–10.5)
nRBC: 0 % (ref 0.0–0.2)

## 2021-09-28 LAB — COMPREHENSIVE METABOLIC PANEL
ALT: 13 U/L (ref 0–44)
AST: 15 U/L (ref 15–41)
Albumin: 3.6 g/dL (ref 3.5–5.0)
Alkaline Phosphatase: 94 U/L (ref 38–126)
Anion gap: 8 (ref 5–15)
BUN: 12 mg/dL (ref 6–20)
CO2: 24 mmol/L (ref 22–32)
Calcium: 9 mg/dL (ref 8.9–10.3)
Chloride: 102 mmol/L (ref 98–111)
Creatinine, Ser: 0.56 mg/dL (ref 0.44–1.00)
GFR, Estimated: >60 mL/min (ref >60)
Glucose, Bld: 104 mg/dL — ABNORMAL HIGH (ref 70–99)
Potassium: 3.7 mmol/L (ref 3.5–5.1)
Sodium: 134 mmol/L — ABNORMAL LOW (ref 135–145)
Total Bilirubin: 0.3 mg/dL (ref 0.3–1.2)
Total Protein: 7.2 g/dL (ref 6.5–8.1)

## 2021-09-28 MED ORDER — HEPARIN SOD (PORK) LOCK FLUSH 100 UNIT/ML IV SOLN
500.0000 [IU] | Freq: Once | INTRAVENOUS | Status: AC
  Administered 2021-09-28: 11:00:00 500 [IU] via INTRAVENOUS
  Filled 2021-09-28: qty 5

## 2021-09-28 MED ORDER — LEVOFLOXACIN 500 MG PO TABS: 500.0000 mg | ORAL_TABLET | Freq: Every day | ORAL | 0 refills | Status: DC

## 2021-09-28 MED ORDER — SODIUM CHLORIDE 0.9% FLUSH
10.0000 mL | Freq: Once | INTRAVENOUS | Status: AC
  Administered 2021-09-28: 11:00:00 10 mL via INTRAVENOUS
  Filled 2021-09-28: qty 10

## 2021-09-28 NOTE — Progress Notes (Signed)
Symptom Management [place] at [facility] [phone] [phone]  Patient Care Team: Langston Reusing, NP as PCP - General (Gerontology) Telford Nab, RN as Oncology Nurse Navigator   Name of the patient: Stacie Kennedy  [id number]  04-Oct-1961   Date of visit: 09/28/21  Reason for Consult: Stacie Kennedy is a 60 y.o. 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  Patient has had severe pain with scapular metastases and was started on XRT.    Patient last saw Dr. Rogue Bussing on 09/14/2021.  Patient recently started meloxicam for pain but subsequently discontinued due to feeling lightheaded/dizzy.  She was sent for MRI of the brain, which was negative for acute findings.  Patient received chemo with carbo/etoposide/Tecentriq on 12/5.  Patient presents to [facility] today for chemotherapy follow-up.  Patient reports that she is doing well.  She denies any symptomatic concerns at present.  She reports tolerable pain on current regimen.  Denies any neurologic complaints. Denies recent fevers or illnesses. Denies any easy bleeding or bruising. Reports fair appetite and denies weight loss. Denies chest pain. Denies any nausea, vomiting, constipation, or diarrhea. Denies urinary complaints. Patient offers no further specific complaints today.  PAST MEDICAL HISTORY: Past Medical History:  Diagnosis Date   Cancer ([place])    lung cancer   Depression    Duodenitis    Dyspnea    Family history of cancer    High cholesterol    Personal history of colonic polyps    Pre-diabetes    Umbilical hernia    UTI (urinary tract infection)     PAST SURGICAL HISTORY:  Past Surgical History:  Procedure Laterality Date   COLONOSCOPY WITH PROPOFOL N/A 03/24/2021    Procedure: COLONOSCOPY WITH PROPOFOL;  Surgeon: Jonathon Bellows, MD;  Location: [facility] ENDOSCOPY;  Service: Gastroenterology;  Laterality: N/A;   INTERCOSTAL NERVE BLOCK  09/19/2020   Procedure: INTERCOSTAL NERVE BLOCK;  Surgeon: [place] Nakayama, MD;  Location: [place];  Service: Thoracic;;   IR IMAGING GUIDED PORT INSERTION  09/23/2021   LUNG LOBECTOMY Right    LUNG REMOVAL, PARTIAL Right 09/19/2020   NODE DISSECTION Right 09/19/2020   Procedure: NODE DISSECTION;  Surgeon: [place] Nakayama, MD;  Location: [place];  Service: Thoracic;  Laterality: Right;   VIDEO BRONCHOSCOPY N/A 07/08/2021   Procedure: VIDEO BRONCHOSCOPY;  Surgeon: [place] Nakayama, MD;  Location: [place];  Service: Thoracic;  Laterality: N/A;    HEMATOLOGY/ONCOLOGY HISTORY:  Oncology History Overview Note  # NOV -[id number] [LUNG CANCER SCREENING PROGRAM]-18 mm right upper lobe lung nodule; [place] 2021- s/p right upper lobectomy [Dr. Roxan Hockey; [place]]; STAGE: I [pT-18 mm; LN-12=0]; predominant large cell neuroendocrine; minor adenocarcinoma.  Declines adjuvant chemotherapy.  #Recurrent/stage IV cancer NOV 2022- PET scan-scapular lesion liver lesion gastrohepatic lymphadenopathy.  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  Visceral Pleura Invasion: Not identified  Direct Invasion of Adjacent Structures: No adjacent structures present  Lymphovascular Invasion: Not identified  Margins: All margins negative for invasive carcinoma       Closest Margin(s) to Invasive Carcinoma: Bronchovascular margin  Treatment Effect: No known presurgical therapy   Regional Lymph Nodes:       Number of Lymph Nodes Involved: 0                            Nodal Sites with Tumor: Not applicable       Number of Lymph Nodes Examined: 12      Primary cancer of right upper lobe of lung ([place])  11/03/2020 Initial Diagnosis   Primary cancer of right upper lobe of lung ([place])   11/03/2020 Cancer Staging   Staging form: Lung, AJCC 8th Edition - Pathologic: Stage IA3 (pT1c, pN0, cM0) - Signed by Cammie Sickle, MD on 11/04/2020    09/14/2021 -  Chemotherapy   Patient is on Treatment Plan : LUNG SCLC Carboplatin + Etoposide + Atezolizumab Induction q21d / Atezolizumab Maintenance q21d       ALLERGIES:  has No Known Allergies.  MEDICATIONS:  Current Outpatient Medications  Medication Sig Dispense Refill   albuterol (VENTOLIN HFA) 108 (90 Base) MCG/ACT inhaler Inhale 2 puffs into the lungs every 6 (six) hours as needed for wheezing or shortness of breath. 6.7 g 2   aspirin EC 81 MG tablet Take 1 tablet (81 mg total) by mouth daily. Swallow whole. 30 tablet 11   buPROPion (WELLBUTRIN SR) 150 MG 12 hr tablet Take 1 tablet (150 mg total) by mouth 2 (two) times daily. 60 tablet 0   lidocaine-prilocaine (EMLA) cream Apply one application topically the the affected area(s) daily as needed. 30 g 3   meloxicam (MOBIC) 15 MG tablet Take 1 tablet (15 mg total) by mouth daily. (Patient not taking: Reported on 09/14/2021) 30 tablet 0   metFORMIN (GLUCOPHAGE) 500 MG tablet TAKE ONE TABLET BY MOUTH EVERY MORNING WITH BREAKFAST 30 tablet 3   morphine (MS CONTIN) 15 MG 12 hr tablet Take 1 tablet (15 mg total) by mouth every 8 (eight) hours. 90 tablet 0   ondansetron (ZOFRAN) 4 MG tablet TAKE 2 TABLETS (8MG) BY MOUTH ONCE EVERY 8 HOURS AS NEEDED FOR NAUSEA OR VOMITING. 80 tablet 1   Oxycodone HCl 10 MG TABS Take 1 tablet (10 mg total) by mouth every 4 (four) hours as needed (pain). 60 tablet 0   prochlorperazine (COMPAZINE) 10 MG tablet Take 1 tablet (10 mg total) by mouth  once every 6 (six) hours as needed for nausea or vomiting. 40 tablet 1   rosuvastatin (CRESTOR) 10 MG tablet TAKE ONE TABLET BY MOUTH EVERY DAY (Patient not taking: Reported on 09/14/2021) 30 tablet 3   No current facility-administered medications for this visit.    VITAL SIGNS: BP 104/69    Pulse 78    Temp 97.7 F (36.5 C) (Tympanic)    Resp 18    SpO2 96%  There were no vitals filed for this visit.  Estimated body mass index is 23.61 kg/m as calculated from the following:   Height as of 09/23/21: 5' 7.5" (1.715 m).   Weight as of 09/23/21: 153 lb (69.4 kg).  LABS: CBC:    Component Value Date/Time   WBC 7.2 09/14/2021 0837   HGB 15.2 (H) 09/14/2021 0837   HGB 15.5 11/26/2020 1214   HCT 45.0 09/14/2021 0837   HCT 47.6 (H) 11/26/2020 1214   PLT 315 09/14/2021  0837   PLT 317 11/26/2020 1214   MCV 88.6 09/14/2021 0837   MCV 87 11/26/2020 1214   NEUTROABS 3.0 09/14/2021 0837   NEUTROABS 2.5 11/26/2020 1214   LYMPHSABS 3.2 09/14/2021 0837   LYMPHSABS 3.6 (H) 11/26/2020 1214   MONOABS 0.9 09/14/2021 0837   EOSABS 0.1 09/14/2021 0837   EOSABS 0.2 11/26/2020 1214   BASOSABS 0.0 09/14/2021 0837   BASOSABS 0.1 11/26/2020 1214   Comprehensive Metabolic Panel:    Component Value Date/Time   NA 138 09/14/2021 0837   NA 139 11/26/2020 1214   K 3.7 09/14/2021 0837   CL 101 09/14/2021 0837   CO2 28 09/14/2021 0837   BUN 22 (H) 09/14/2021 0837   BUN 8 11/26/2020 1214   CREATININE 0.63 09/14/2021 0837   GLUCOSE 113 (H) 09/14/2021 0837   CALCIUM 9.6 09/14/2021 0837   AST 13 (L) 09/14/2021 0837   ALT 12 09/14/2021 0837   ALKPHOS 91 09/14/2021 0837   BILITOT 0.3 09/14/2021 0837   BILITOT 0.3 11/26/2020 1214   PROT 7.7 09/14/2021 0837   PROT 6.9 11/26/2020 1214   ALBUMIN 4.0 09/14/2021 0837   ALBUMIN 4.0 11/26/2020 1214    RADIOGRAPHIC STUDIES: MR Brain W Wo Contrast  Result Date: 09/15/2021 CLINICAL DATA:  Diplopia metastatic lung cancer EXAM: MRI HEAD WITHOUT AND WITH  CONTRAST TECHNIQUE: Multiplanar, multiecho pulse sequences of the brain and surrounding structures were obtained without and with intravenous contrast. CONTRAST:  41m GADAVIST GADOBUTROL 1 MMOL/ML IV SOLN COMPARISON:  None. FINDINGS: Brain: Punctate focus of nonenhancing DWI hyperintensity in the high left parietal cortex (series 5, image 30; series 7, image 11). No mass effect or edema. No definite ADC correlate or enhancement. No abnormal enhancement in the remainder of brain. Remote small bilateral cerebellar infarcts. No extra-axial fluid collection, abnormal mass effect, or midline shift. No hydrocephalus. No acute hemorrhage Vascular: Major arterial flow voids are maintained skull base. Skull and upper cervical spine: Normal marrow signal. Sinuses/Orbits: Mild-to-moderate paranasal sinus mucosal thickening. Other: No mastoid effusions IMPRESSION: 1. Punctate focus of nonenhancing DWI hyperintensity in the high left parietal cortex without mass effect or edema. This could represent a tiny acute/subacute infarct or artifact. Although thought less likely, tiny nonenhancing metastatic lesion is not excluded in the setting of known metastatic lung cancer. Follow-up MRI with contrast in 3-4 weeks is recommended to ensure expected evolution/resolution. 2. Remote small bilateral cerebellar infarcts. Electronically Signed   By: FMargaretha SheffieldM.D.   On: 09/15/2021 11:50   IR IMAGING GUIDED PORT INSERTION  Result Date: 09/23/2021 CLINICAL DATA:  Non-small cell lung carcinoma post right upper lobectomy EXAM: TUNNELED PORT CATHETER PLACEMENT WITH ULTRASOUND AND FLUOROSCOPIC GUIDANCE FLUOROSCOPY TIME:  0.2 minutes; 42  uGym2 DAP ANESTHESIA/SEDATION: Intravenous Fentanyl 1051m and Versed 22m43mere administered as conscious sedation during continuous monitoring of the patient's level of consciousness and physiological / cardiorespiratory status by the radiology RN, with a total moderate sedation time of 14 minutes.  TECHNIQUE: The procedure, risks, benefits, and alternatives were explained to the patient. Questions regarding the procedure were encouraged and answered. The patient understands and consents to the procedure. Patency of the right IJ vein was confirmed with ultrasound with image documentation. An appropriate skin site was determined. Skin site was marked. Region was prepped using maximum barrier technique including cap and mask, sterile gown, sterile gloves, large sterile sheet, and Chlorhexidine as cutaneous antisepsis. The region was infiltrated locally with 1% lidocaine. Under real-time ultrasound guidance, the right IJ vein  was accessed with a 21 gauge micropuncture needle; the needle tip within the vein was confirmed with ultrasound image documentation. Needle was exchanged over a 018 guidewire for transitional dilator, and vascular measurement was performed. A small incision was made on the right anterior chest wall and a subcutaneous pocket fashioned. The power-injectable port was positioned and its catheter tunneled to the right IJ dermatotomy site. The transitional dilator was exchanged over an Amplatz wire for a peel-away sheath, through which the port catheter, which had been trimmed to the appropriate length, was advanced and positioned under fluoroscopy with its tip at the cavoatrial junction. Spot chest radiograph confirms good catheter position and no pneumothorax. The port was flushed per protocol. The pocket was closed with deep interrupted and subcuticular continuous 3-0 Monocryl sutures. The incisions were covered with Dermabond then covered with a sterile dressing. The patient tolerated the procedure well. COMPLICATIONS: COMPLICATIONS None immediate IMPRESSION: Technically successful right IJ power-injectable port catheter placement. Ready for routine use. Electronically Signed   By: Lucrezia Europe M.D.   On: 09/23/2021 12:52    PERFORMANCE STATUS (ECOG) : 1 - Symptomatic but completely  ambulatory  Review of Systems Unless otherwise noted, a complete review of systems is negative.  Physical Exam General: NAD Cardiovascular: regular rate and rhythm Pulmonary: clear anterior/posterior fields Abdomen: soft, nontender, + bowel sounds GU: no suprapubic tenderness Extremities: no edema, no joint deformities Skin: no rashes Neurological: Grossly nonfocal  Assessment and Plan- Patient is a 60 y.o. 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  Patient has had severe pain with scapular metastases and was started on XRT.  She is on active chemotherapy.  Patient presents to [facility] today for chemo follow-up  Stage IV lung cancer -patient appears symptomatically well controlled.  Last chemo with carbo/etoposide/Tecentriq was on 12/7.  She is scheduled to follow-up with Dr. Rogue Bussing on 1/30 for consideration of next cycle chemotherapy   Neutropenia -secondary to chemotherapy.  Patient is likely close to nadir.  It does not appear that she received Udenyca at last scheduled chemotherapy.  Discussed option of daily Neupogen but patient was not inclined to want to return to clinic.  We will start her on prophylactic Levaquin.  Discussed infection prevention.  Patient is currently asymptomatic.  We will bring patient back in 5 to 7 days for repeat CBC.  Neoplasm related pain -continue MS Contin/oxycodone   Patient expressed understanding and was in agreement with this plan. She also understands that She can call clinic at any time with any questions, concerns, or complaints.   Thank you for allowing me to participate in the care of this very pleasant patient.   Time Total: 15 minutes  Visit consisted of counseling and education dealing with the complex and emotionally intense issues of  symptom management in the setting of serious illness.Greater than 50%  of this time was spent counseling and coordinating care related to the above assessment and plan.  Signed by: Altha Harm, PhD, NP-C

## 2021-09-28 NOTE — Progress Notes (Signed)
Pt presents to [facility] for f/u from chemo. She reports good appetite and fluid intake. Denies nausea or vomiting. Reports that she feels fatigued, but states that she did not sleep well last night.

## 2021-09-29 ENCOUNTER — Inpatient Hospital Stay: Payer: Medicaid Other

## 2021-09-29 ENCOUNTER — Other Ambulatory Visit: Payer: Self-pay | Admitting: Nurse Practitioner

## 2021-09-29 ENCOUNTER — Telehealth: Payer: Self-pay | Admitting: Nurse Practitioner

## 2021-09-29 NOTE — Progress Notes (Signed)
Nutrition Follow-up:  Patient with lung cancer finished radiation treatments now receiving Carboplatin+Etoposide+ Atezolizumab.  She is followed by Dr. Rogue Bussing. Spoke with patient over the phone. She reports she isn't having any nutrition impact symptoms at this time, denies nausea, vomiting, diarrhea or constipation. Her appetite is usual, "I eat regular foods"  Usual intake is 2 meals a day with some snacking on cashews, lots of fruits, chicken and potatoes, likes hard boiled eggs. Drinks only water on rare occasions some milk.  She asked about getting some ONS samples. She states she is not working right now and can't afford to purchase any.   Medications: Zofran, Compazine, Morphine, Oxycodone, Metformin  Labs: 09/28/21 Albumin 3.4, down from 09/14/21 4.0    Anthropometrics:   Weight:  153# 09/23/21,  152#09/14/21 155# 08/31/21  BMI: 23.61   NUTRITION DIAGNOSIS: Inadequate protein intake as evidenced by decrease in albumin  MALNUTRITION DIAGNOSIS: none   INTERVENTION: Reviewed good sources of protein and some snack ideas. RD will provide written materials and more samples at in-person follow up appointment.     MONITORING, EVALUATION, GOAL: Weight trends, PO intake and labs   NEXT VISIT: Follow up in infusion with Jennet Maduro RD, LDN 10/14/20

## 2021-09-29 NOTE — Telephone Encounter (Signed)
Spoke with patient to see if we could reschedule today's Palliative Consult to Friday 10/02/21 @ 2 PM and she was in agreement with this.

## 2021-10-01 ENCOUNTER — Other Ambulatory Visit: Payer: Self-pay | Admitting: Nurse Practitioner

## 2021-10-02 ENCOUNTER — Inpatient Hospital Stay: Payer: Medicaid Other

## 2021-10-02 ENCOUNTER — Other Ambulatory Visit: Payer: Self-pay

## 2021-10-02 ENCOUNTER — Telehealth: Payer: Self-pay | Admitting: Nurse Practitioner

## 2021-10-02 ENCOUNTER — Other Ambulatory Visit: Payer: Self-pay | Admitting: Nurse Practitioner

## 2021-10-02 DIAGNOSIS — Z510 Encounter for antineoplastic radiation therapy: Secondary | ICD-10-CM | POA: Diagnosis not present

## 2021-10-02 DIAGNOSIS — C3411 Malignant neoplasm of upper lobe, right bronchus or lung: Secondary | ICD-10-CM

## 2021-10-02 LAB — CBC WITH DIFFERENTIAL/PLATELET
Abs Immature Granulocytes: 0.01 10*3/uL (ref 0.00–0.07)
Basophils Absolute: 0 10*3/uL (ref 0.0–0.1)
Basophils Relative: 1 %
Eosinophils Absolute: 0 10*3/uL (ref 0.0–0.5)
Eosinophils Relative: 1 %
HCT: 43.1 % (ref 36.0–46.0)
Hemoglobin: 14.4 g/dL (ref 12.0–15.0)
Immature Granulocytes: 0 %
Lymphocytes Relative: 64 %
Lymphs Abs: 2.4 10*3/uL (ref 0.7–4.0)
MCH: 29.4 pg (ref 26.0–34.0)
MCHC: 33.4 g/dL (ref 30.0–36.0)
MCV: 88 fL (ref 80.0–100.0)
Monocytes Absolute: 0.6 10*3/uL (ref 0.1–1.0)
Monocytes Relative: 16 %
Neutro Abs: 0.7 10*3/uL — ABNORMAL LOW (ref 1.7–7.7)
Neutrophils Relative %: 18 %
Platelets: 313 10*3/uL (ref 150–400)
RBC: 4.9 MIL/uL (ref 3.87–5.11)
RDW: 15.5 % (ref 11.5–15.5)
WBC: 3.8 10*3/uL — ABNORMAL LOW (ref 4.0–10.5)
nRBC: 0 % (ref 0.0–0.2)

## 2021-10-02 NOTE — Telephone Encounter (Signed)
I called Ms. Sirois to confirm initial pc home visit, Ms. Korus is not feeling well, wishes to reschedule. Rescheduled per request.

## 2021-10-13 ENCOUNTER — Other Ambulatory Visit: Payer: Self-pay

## 2021-10-13 ENCOUNTER — Inpatient Hospital Stay (HOSPITAL_BASED_OUTPATIENT_CLINIC_OR_DEPARTMENT_OTHER): Payer: Medicaid Other | Admitting: Internal Medicine

## 2021-10-13 ENCOUNTER — Encounter: Payer: Self-pay | Admitting: Internal Medicine

## 2021-10-13 ENCOUNTER — Inpatient Hospital Stay: Payer: Medicaid Other | Attending: Internal Medicine

## 2021-10-13 ENCOUNTER — Inpatient Hospital Stay: Payer: Medicaid Other

## 2021-10-13 DIAGNOSIS — Z801 Family history of malignant neoplasm of trachea, bronchus and lung: Secondary | ICD-10-CM | POA: Insufficient documentation

## 2021-10-13 DIAGNOSIS — Z5189 Encounter for other specified aftercare: Secondary | ICD-10-CM | POA: Diagnosis not present

## 2021-10-13 DIAGNOSIS — Z5111 Encounter for antineoplastic chemotherapy: Secondary | ICD-10-CM | POA: Insufficient documentation

## 2021-10-13 DIAGNOSIS — C787 Secondary malignant neoplasm of liver and intrahepatic bile duct: Secondary | ICD-10-CM | POA: Diagnosis not present

## 2021-10-13 DIAGNOSIS — Z87442 Personal history of urinary calculi: Secondary | ICD-10-CM | POA: Insufficient documentation

## 2021-10-13 DIAGNOSIS — Z79899 Other long term (current) drug therapy: Secondary | ICD-10-CM | POA: Diagnosis not present

## 2021-10-13 DIAGNOSIS — Z7982 Long term (current) use of aspirin: Secondary | ICD-10-CM | POA: Insufficient documentation

## 2021-10-13 DIAGNOSIS — C3411 Malignant neoplasm of upper lobe, right bronchus or lung: Secondary | ICD-10-CM | POA: Diagnosis not present

## 2021-10-13 DIAGNOSIS — Z86010 Personal history of colonic polyps: Secondary | ICD-10-CM | POA: Insufficient documentation

## 2021-10-13 DIAGNOSIS — K449 Diaphragmatic hernia without obstruction or gangrene: Secondary | ICD-10-CM | POA: Insufficient documentation

## 2021-10-13 DIAGNOSIS — C7951 Secondary malignant neoplasm of bone: Secondary | ICD-10-CM | POA: Insufficient documentation

## 2021-10-13 DIAGNOSIS — G893 Neoplasm related pain (acute) (chronic): Secondary | ICD-10-CM | POA: Insufficient documentation

## 2021-10-13 DIAGNOSIS — Z87891 Personal history of nicotine dependence: Secondary | ICD-10-CM | POA: Diagnosis not present

## 2021-10-13 DIAGNOSIS — K5900 Constipation, unspecified: Secondary | ICD-10-CM | POA: Diagnosis not present

## 2021-10-13 LAB — CBC WITH DIFFERENTIAL/PLATELET
Abs Immature Granulocytes: 0.04 10*3/uL (ref 0.00–0.07)
Basophils Absolute: 0.1 10*3/uL (ref 0.0–0.1)
Basophils Relative: 1 %
Eosinophils Absolute: 0 10*3/uL (ref 0.0–0.5)
Eosinophils Relative: 0 %
HCT: 40.7 % (ref 36.0–46.0)
Hemoglobin: 13.7 g/dL (ref 12.0–15.0)
Immature Granulocytes: 1 %
Lymphocytes Relative: 32 %
Lymphs Abs: 2.6 10*3/uL (ref 0.7–4.0)
MCH: 29.5 pg (ref 26.0–34.0)
MCHC: 33.7 g/dL (ref 30.0–36.0)
MCV: 87.5 fL (ref 80.0–100.0)
Monocytes Absolute: 0.8 10*3/uL (ref 0.1–1.0)
Monocytes Relative: 10 %
Neutro Abs: 4.7 10*3/uL (ref 1.7–7.7)
Neutrophils Relative %: 56 %
Platelets: 435 10*3/uL — ABNORMAL HIGH (ref 150–400)
RBC: 4.65 MIL/uL (ref 3.87–5.11)
RDW: 15.8 % — ABNORMAL HIGH (ref 11.5–15.5)
WBC: 8.3 10*3/uL (ref 4.0–10.5)
nRBC: 0 % (ref 0.0–0.2)

## 2021-10-13 LAB — COMPREHENSIVE METABOLIC PANEL
ALT: 9 U/L (ref 0–44)
AST: 15 U/L (ref 15–41)
Albumin: 3.4 g/dL — ABNORMAL LOW (ref 3.5–5.0)
Alkaline Phosphatase: 87 U/L (ref 38–126)
Anion gap: 7 (ref 5–15)
BUN: 12 mg/dL (ref 6–20)
CO2: 27 mmol/L (ref 22–32)
Calcium: 9.1 mg/dL (ref 8.9–10.3)
Chloride: 103 mmol/L (ref 98–111)
Creatinine, Ser: 0.69 mg/dL (ref 0.44–1.00)
GFR, Estimated: >60 mL/min (ref >60)
Glucose, Bld: 141 mg/dL — ABNORMAL HIGH (ref 70–99)
Potassium: 3.5 mmol/L (ref 3.5–5.1)
Sodium: 137 mmol/L (ref 135–145)
Total Bilirubin: <0.1 mg/dL — ABNORMAL LOW (ref 0.3–1.2)
Total Protein: 7 g/dL (ref 6.5–8.1)

## 2021-10-13 MED ORDER — OXYCODONE HCL 10 MG PO TABS: 10.0000 mg | ORAL_TABLET | Freq: Four times a day (QID) | ORAL | 0 refills | Status: DC | PRN

## 2021-10-13 MED ORDER — SODIUM CHLORIDE 0.9 % IV SOLN
540.5000 mg | Freq: Once | INTRAVENOUS | Status: AC
  Administered 2021-10-13: 540.5 mg via INTRAVENOUS
  Filled 2021-10-13: qty 54.05

## 2021-10-13 MED ORDER — HEPARIN SOD (PORK) LOCK FLUSH 100 UNIT/ML IV SOLN
INTRAVENOUS | Status: AC
  Administered 2021-10-13: 500 [IU]
  Filled 2021-10-13: qty 5

## 2021-10-13 MED ORDER — PALONOSETRON HCL INJECTION 0.25 MG/5ML
0.2500 mg | Freq: Once | INTRAVENOUS | Status: AC
  Administered 2021-10-13: 0.25 mg via INTRAVENOUS
  Filled 2021-10-13: qty 5

## 2021-10-13 MED ORDER — HEPARIN SOD (PORK) LOCK FLUSH 100 UNIT/ML IV SOLN
500.0000 [IU] | Freq: Once | INTRAVENOUS | Status: AC | PRN
  Filled 2021-10-13: qty 5

## 2021-10-13 MED ORDER — SODIUM CHLORIDE 0.9 % IV SOLN
1200.0000 mg | Freq: Once | INTRAVENOUS | Status: AC
  Administered 2021-10-13: 1200 mg via INTRAVENOUS
  Filled 2021-10-13: qty 20

## 2021-10-13 MED ORDER — SODIUM CHLORIDE 0.9 % IV SOLN
150.0000 mg | Freq: Once | INTRAVENOUS | Status: AC
  Administered 2021-10-13: 150 mg via INTRAVENOUS
  Filled 2021-10-13: qty 150

## 2021-10-13 MED ORDER — SODIUM CHLORIDE 0.9 % IV SOLN
100.0000 mg/m2 | Freq: Once | INTRAVENOUS | Status: AC
  Administered 2021-10-13: 180 mg via INTRAVENOUS
  Filled 2021-10-13: qty 9

## 2021-10-13 MED ORDER — SODIUM CHLORIDE 0.9 % IV SOLN
10.0000 mg | Freq: Once | INTRAVENOUS | Status: AC
  Administered 2021-10-13: 10 mg via INTRAVENOUS
  Filled 2021-10-13: qty 10

## 2021-10-13 MED ORDER — SODIUM CHLORIDE 0.9 % IV SOLN
Freq: Once | INTRAVENOUS | Status: AC
  Filled 2021-10-13: qty 250

## 2021-10-13 NOTE — Assessment & Plan Note (Addendum)
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 PET 08/28/2021-metastatic disease noted in the liver/left hepatic lobe; hypermetabolic gastrohepatic lymph node; and also left scapular metastases with destruction of the bone [see below].  NGS negative for any targeted options at this time; PDL-1=0. Currently on [place] etoposide-Tecentriq.  # proceed with [place] etoposide-Tecentriq #2 today. Labs today reviewed;  acceptable for treatment today. Again reviewed the palliative nature of the chemotherapy indefinite-until toxicity/progression of disease.  We will plan imaging after 3 cycles.  # mets to bone/ Pain right shoulder- currenrly s/p RT [ 09/16/2021]; continue oxycodone 10 mg every 6 hours as needed- refilled.   Continue MS Contin 15 mg TID.   # constipation: recommend Miralax BID; fluids; Dulcolax.  Marland Kitchen   #IV access: s/p pt reluctant with port placement; finally agrees.  # DISPOSITION: # Chemo today;  # undenyca- 1/06#  # follow up in Jan 30th- MD- port lab,  [place], etop, tecentriq; d-2 & d-3 etop; D-4 Udenyca-Dr.B

## 2021-10-13 NOTE — Patient Instructions (Addendum)
[facility]CER [facility] AT [place]  Discharge Instructions: Thank you for choosing [facility] to provide your oncology and hematology care.  If you have a lab appointment with the [facility], please go directly to the [place] and check in at the registration area.  Wear comfortable clothing and clothing appropriate for easy access to any Portacath or PICC line.   We strive to give you quality time with your provider. You may need to reschedule your appointment if you arrive late (15 or more minutes).  Arriving late affects you and other patients whose appointments are after yours.  Also, if you miss three or more appointments without notifying the office, you may be dismissed from the clinic at the providers discretion.      For prescription refill requests, have your pharmacy contact our office and allow 72 hours for refills to be completed.    Today you received the following chemotherapy and/or immunotherapy agents Carboplatin, Etoposide, and Tecentriq   To help prevent nausea and vomiting after your treatment, we encourage you to take your nausea medication as directed.  BELOW ARE SYMPTOMS THAT SHOULD BE REPORTED IMMEDIATELY: *FEVER GREATER THAN 100.4 F (38 C) OR HIGHER *CHILLS OR SWEATING *NAUSEA AND VOMITING THAT IS NOT CONTROLLED WITH YOUR NAUSEA MEDICATION *UNUSUAL SHORTNESS OF BREATH *UNUSUAL BRUISING OR BLEEDING *URINARY PROBLEMS (pain or burning when urinating, or frequent urination) *BOWEL PROBLEMS (unusual diarrhea, constipation, pain near the anus) TENDERNESS IN MOUTH AND THROAT WITH OR WITHOUT PRESENCE OF ULCERS (sore throat, sores in mouth, or a toothache) UNUSUAL RASH, SWELLING OR PAIN  UNUSUAL VAGINAL DISCHARGE OR ITCHING   Items with * indicate a potential emergency and should be followed up as soon as possible or go to the Emergency Department if any problems should occur.  Please show the CHEMOTHERAPY ALERT CARD or IMMUNOTHERAPY  ALERT CARD at check-in to the Emergency Department and triage nurse.  Should you have questions after your visit or need to cancel or reschedule your appointment, please contact [facility] CANC[facility] [place] AT [address]  [phone] and follow the prompts.  Office hours are 8:00 a.m. to 4:30 p.m. Monday - Friday. Please note that voicemails left after 4:00 p.m. may not be returned until the following business day.  We are closed weekends and major holidays. You have access to a nurse at all times for urgent questions. Please call the main number to the clinic [phone] and follow the prompts.  For any non-urgent questions, you may also contact your provider using MyChart. We now offer e-Visits for anyone 61 and older to request care online for non-urgent symptoms. For details visit [email or web address].   Also download the MyChart app! Go to the app store, search "MyChart", open the app, select [facility], and log in with your MyChart username and password.  Due to Covid, a mask is required upon entering the hospital/clinic. If you do not have a mask, one will be given to you upon arrival. For doctor visits, patients may have 1 support person aged 61 or older with them. For treatment visits, patients cannot have anyone with them due to current Covid guidelines and our immunocompromised population.

## 2021-10-13 NOTE — Progress Notes (Signed)
[facility] CONSULT NOTE  Patient Care Team: Cammie Sickle, MD as PCP - General (Internal Medicine) Telford Nab, RN as Oncology Nurse Navigator  CHIEF COMPLAINTS/PURPOSE OF CONSULTATION: lung cancer  #  Oncology History Overview Note  # NOV -[id number] [facility] CANCER SCREENING PROGRAM]-18 mm right upper lobe lung nodule; DEC 2021- s/p right upper lobectomy [Dr. Roxan Hockey; [place]]; STAGE: I [pT-18 mm; LN-12=0]; predominant large cell neuroendocrine; minor adenocarcinoma.  Declines adjuvant chemotherapy.  #Recurrent/stage IV cancer NOV 2022- PET scan-scapular lesion liver lesion gastrohepatic lymphadenopathy.  11/21- start RT to right scapular lesion  # DEC 3rd, 2022- CARBO=ETOP+TECEN; udenyca #1.   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  Visceral Pleura Invasion: Not identified  Direct Invasion of Adjacent Structures: No adjacent structures present  Lymphovascular Invasion: Not identified  Margins: All margins negative for invasive carcinoma       Closest Margin(s) to Invasive Carcinoma: Bronchovascular margin  Treatment Effect: No known presurgical therapy  Regional Lymph Nodes:       Number of Lymph Nodes Involved: 0                            Nodal Sites with Tumor: Not applicable       Number of Lymph Nodes Examined: 12      Primary cancer of right upper lobe of lung ([place])  11/03/2020 Initial Diagnosis   Primary cancer of right upper lobe of lung ([place])   11/03/2020 Cancer Staging   Staging form: Lung, AJCC 8th Edition - Pathologic: Stage IA3 (pT1c, pN0, cM0) - Signed by Cammie Sickle, MD on 11/04/2020     09/14/2021 -  Chemotherapy   Patient is on Treatment Plan : LUNG SCLC Carboplatin + Etoposide + Atezolizumab Induction q21d / Atezolizumab Maintenance q21d        HISTORY OF PRESENTING ILLNESS: Ambulating independently.  Alone.   Stacie Kennedy 61 y.o.  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  Patient states her right shoulder pain improved.  However she continues to be on MS Contin daily; oxycodone up to 4 a day.  No nausea no vomiting.  No headaches. Chronic shortness of breath/chronic mild cough no hemoptysis.   Review of Systems  Constitutional:  Negative for chills, diaphoresis, fever, malaise/fatigue and weight loss.  HENT:  Negative for nosebleeds and sore throat.   Eyes:  Negative for double vision.  Respiratory:  Negative for cough, hemoptysis, sputum production, shortness of breath and wheezing.   Cardiovascular:  Negative for chest pain, palpitations, orthopnea and leg swelling.  Gastrointestinal:  Negative for abdominal pain, blood in stool, constipation, diarrhea, heartburn, melena, nausea and vomiting.  Genitourinary:  Negative for dysuria, frequency and urgency.  Musculoskeletal:  Positive for back pain, joint pain and myalgias.  Skin: Negative.  Negative for itching and rash.  Neurological:  Negative for dizziness, tingling, focal weakness, weakness and headaches.  Endo/Heme/Allergies:  Does not bruise/bleed easily.  Psychiatric/Behavioral:  Negative for depression. The patient is not nervous/anxious and does not have insomnia.  MEDICAL HISTORY:  Past Medical History:  Diagnosis Date   Cancer [facility])    lung cancer   Depression    Duodenitis    Dyspnea    Family history of cancer    High cholesterol    Personal history of colonic polyps    Pre-diabetes    Umbilical hernia    UTI (urinary tract infection)     SURGICAL HISTORY: Past  Surgical History:  Procedure Laterality Date   COLONOSCOPY WITH PROPOFOL N/A 03/24/2021   Procedure: COLONOSCOPY WITH PROPOFOL;  Surgeon: Jonathon Bellows, MD;  Location: [facility] ENDOSCOPY;  Service: Gastroenterology;  Laterality: N/A;   INTERCOSTAL NERVE BLOCK  09/19/2020   Procedure: INTERCOSTAL NERVE BLOCK;  Surgeon: Melrose Nakayama, MD;  Location: [facility];  Service: Thoracic;;   IR IMAGING GUIDED PORT INSERTION  09/23/2021   LUNG LOBECTOMY Right    LUNG REMOVAL, PARTIAL Right 09/19/2020   NODE DISSECTION Right 09/19/2020   Procedure: NODE DISSECTION;  Surgeon: Melrose Nakayama, MD;  Location: [facility];  Service: Thoracic;  Laterality: Right;   VIDEO BRONCHOSCOPY N/A 07/08/2021   Procedure: VIDEO BRONCHOSCOPY;  Surgeon: Melrose Nakayama, MD;  Location[facility] OR;  Service: Thoracic;  Laterality: N/A;    SOCIAL HISTORY: Social History   Socioeconomic History   Marital status: Single    Spouse name: Not on file   Number of children: Not on file   Years of education: Not on file   Highest education level: Not on file  Occupational History   Not on file  Tobacco Use   Smoking status: Some Days    Packs/day: 0.25    Years: 43.00    Pack years: 10.75    Types: Cigarettes   Smokeless tobacco: Never   Tobacco comments:    states she is slowing, trying to quit  Vaping Use   Vaping Use: Never used  Substance and Sexual Activity   Alcohol use: Yes    Alcohol/week: 2.0 standard drinks    Types: 2 Cans of beer per week    Comment: occasional   Drug use: No   Sexual activity: Not on file  Other Topics Concern   Not on file  Social History Narrative   Lives in [place]; smokes; now and then beer; with boy friend. 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    Social Determinants of Health   Financial Resource Strain: Not on file  Food Insecurity: Food Insecurity Present   Worried About [place] in the Last Year: Sometimes true   Ran  Out of Food in the Last Year: Sometimes true  Transportation Needs: No Transportation Needs   Lack of Transportation (Medical): No   Lack of Transportation (Non-Medical): No  Physical Activity: Insufficiently Active   Days of Exercise per Week: 7 days   Minutes of Exercise per Session: 20 min  Stress: Stress Concern Present   Feeling of Stress : To some extent  Social Connections: Socially Isolated   Frequency of Communication with Friends and Family: Never   Frequency of Social Gatherings with Friends and Family: Never   Attends Religious Services: Never   Marine scientist or Organizations: No   Attends Music therapist: Never   Marital Status: Never married  Human resources officer Violence: Not on file    FAMILY HISTORY: Family History  Problem Relation Age of Onset   Diabetes Mother    Cancer Mother    Diabetes Father    Stroke Father  Heart attack Father        82s   Healthy Sister    Cancer Brother    Hepatitis Brother    Diabetes Brother    Hypertension Brother    Heart disease Brother     ALLERGIES:  has No Known Allergies.  MEDICATIONS:  Current Outpatient Medications  Medication Sig Dispense Refill   albuterol (VENTOLIN HFA) 108 (90 Base) MCG/ACT inhaler Inhale 2 puffs into the lungs every 6 (six) hours as needed for wheezing or shortness of breath. 6.7 g 2   aspirin EC 81 MG tablet Take 1 tablet (81 mg total) by mouth daily. Swallow whole. 30 tablet 11   levofloxacin (LEVAQUIN) 500 MG tablet Take 1 tablet (500 mg total) by mouth daily. 7 tablet 0   lidocaine-prilocaine (EMLA) cream Apply one application topically the the affected area(s) daily as needed. 30 g 3   metFORMIN (GLUCOPHAGE) 500 MG tablet TAKE ONE TABLET BY MOUTH EVERY MORNING WITH BREAKFAST 30 tablet 3   morphine (MS CONTIN) 15 MG 12 hr tablet Take 1 tablet (15 mg total) by mouth every 8 (eight) hours. 90 tablet 0   ondansetron (ZOFRAN) 4 MG tablet TAKE 2  TABLETS (8MG) BY MOUTH ONCE EVERY 8 HOURS AS NEEDED FOR NAUSEA OR VOMITING. 80 tablet 1   prochlorperazine (COMPAZINE) 10 MG tablet Take 1 tablet (10 mg total) by mouth once every 6 (six) hours as needed for nausea or vomiting. 40 tablet 1   buPROPion (WELLBUTRIN SR) 150 MG 12 hr tablet Take 1 tablet (150 mg total) by mouth 2 (two) times daily. (Patient not taking: Reported on 10/13/2021) 60 tablet 0   Oxycodone HCl 10 MG TABS Take 1 tablet (10 mg total) by mouth every 6 (six) hours as needed (pain). 60 tablet 0   rosuvastatin (CRESTOR) 10 MG tablet TAKE ONE TABLET BY MOUTH EVERY DAY (Patient not taking: Reported on 10/13/2021) 30 tablet 3   No current facility-administered medications for this visit.   Facility-Administered Medications Ordered in Other Visits  Medication Dose Route Frequency Provider Last Rate Last Admin   CARBOplatin (PARAPLATIN) 540.5 mg in sodium chloride 0.9 % 250 mL chemo infusion  540.5 mg Intravenous Once Charlaine Dalton R, MD 608 mL/hr at 10/13/21 1216 540.5 mg at 10/13/21 1216   heparin lock flush 100 unit/mL  500 Units Intracatheter Once PRN Cammie Sickle, MD          .  PHYSICAL EXAMINATION: ECOG PERFORMANCE STATUS: 0 - Asymptomatic  Vitals:   10/13/21 0839  BP: 112/76  Pulse: 79  Temp: (!) 97.3 F (36.3 C)  SpO2: 98%      Filed Weights   10/13/21 0839  Weight: 157 lb (71.2 kg)       Physical Exam HENT:     Head: Normocephalic and atraumatic.     Mouth/Throat:     Pharynx: No oropharyngeal exudate.  Eyes:     Pupils: Pupils are equal, round, and reactive to light.  Cardiovascular:     Rate and Rhythm: Normal rate and regular rhythm.  Pulmonary:     Effort: Pulmonary effort is normal. No respiratory distress.     Breath sounds: Normal breath sounds. No wheezing.  Abdominal:     General: Bowel sounds are normal. There is no distension.     Palpations: Abdomen is soft. There is no mass.     Tenderness: There is no abdominal  tenderness. There is no guarding or rebound.  Musculoskeletal:  General: No tenderness. Normal range of motion.     Cervical back: Normal range of motion and neck supple.  Skin:    General: Skin is warm.  Neurological:     Mental Status: She is alert and oriented to person, place, and time.  Psychiatric:        Mood and Affect: Affect normal.     LABORATORY DATA:  I have reviewed the data as listed Lab Results  Component Value Date   WBC 8.3 10/13/2021   HGB 13.7 10/13/2021   HCT 40.7 10/13/2021   MCV 87.5 10/13/2021   PLT 435 (H) 10/13/2021   Recent Labs    11/26/20 1214 05/20/21 1343 09/14/21 0837 09/28/21 0955 10/13/21 0824  NA 139   < > 138 134* 137  K 4.1   < > 3.7 3.7 3.5  CL 103   < > 101 102 103  CO2 22   < > 28 24 27   GLUCOSE 99   < > 113* 104* 141*  BUN 8   < > 22* 12 12  CREATININE 0.69   < > 0.63 0.56 0.69  CALCIUM 9.5   < > 9.6 9.0 9.1  GFRNONAA 96   < > >60 >60 >60  GFRAA 110  --   --   --   --   PROT 6.9   < > 7.7 7.2 7.0  ALBUMIN 4.0   < > 4.0 3.6 3.4*  AST 18   < > 13* 15 15  ALT 9   < > 12 13 9   ALKPHOS 113   < > 91 94 87  BILITOT 0.3   < > 0.3 0.3 <0.1*   < > = values in this interval not displayed.    RADIOGRAPHIC STUDIES: I have personally reviewed the radiological images as listed and agreed with the findings in the report. MR Brain W Wo Contrast  Result Date: 09/15/2021 CLINICAL DATA:  Diplopia metastatic lung cancer EXAM: MRI HEAD WITHOUT AND WITH CONTRAST TECHNIQUE: Multiplanar, multiecho pulse sequences of the brain and surrounding structures were obtained without and with intravenous contrast. CONTRAST:  71m GADAVIST GADOBUTROL 1 MMOL/ML IV SOLN COMPARISON:  None. FINDINGS: Brain: Punctate focus of nonenhancing DWI hyperintensity in the high left parietal cortex (series 5, image 30; series 7, image 11). No mass effect or edema. No definite ADC correlate or enhancement. No abnormal enhancement in the remainder of brain. Remote small  bilateral cerebellar infarcts. No extra-axial fluid collection, abnormal mass effect, or midline shift. No hydrocephalus. No acute hemorrhage Vascular: Major arterial flow voids are maintained skull base. Skull and upper cervical spine: Normal marrow signal. Sinuses/Orbits: Mild-to-moderate paranasal sinus mucosal thickening. Other: No mastoid effusions IMPRESSION: 1. Punctate focus of nonenhancing DWI hyperintensity in the high left parietal cortex without mass effect or edema. This could represent a tiny acute/subacute infarct or artifact. Although thought less likely, tiny nonenhancing metastatic lesion is not excluded in the setting of known metastatic lung cancer. Follow-up MRI with contrast in 3-4 weeks is recommended to ensure expected evolution/resolution. 2. Remote small bilateral cerebellar infarcts. Electronically Signed   By: FMargaretha SheffieldM.D.   On: 09/15/2021 11:50   IR IMAGING GUIDED PORT INSERTION  Result Date: 09/23/2021 CLINICAL DATA:  Non-small cell lung carcinoma post right upper lobectomy EXAM: TUNNELED PORT CATHETER PLACEMENT WITH ULTRASOUND AND FLUOROSCOPIC GUIDANCE FLUOROSCOPY TIME:  0.2 minutes; 42  uGym2 DAP ANESTHESIA/SEDATION: Intravenous Fentanyl 108m and Versed 59m91mere administered as conscious sedation during continuous monitoring  of the patient's level of consciousness and physiological / cardiorespiratory status by the radiology RN, with a total moderate sedation time of 14 minutes. TECHNIQUE: The procedure, risks, benefits, and alternatives were explained to the patient. Questions regarding the procedure were encouraged and answered. The patient understands and consents to the procedure. Patency of the right IJ vein was confirmed with ultrasound with image documentation. An appropriate skin site was determined. Skin site was marked. Region was prepped using maximum barrier technique including cap and mask, sterile gown, sterile gloves, large sterile sheet, and  Chlorhexidine as cutaneous antisepsis. The region was infiltrated locally with 1% lidocaine. Under real-time ultrasound guidance, the right IJ vein was accessed with a 21 gauge micropuncture needle; the needle tip within the vein was confirmed with ultrasound image documentation. Needle was exchanged over a 018 guidewire for transitional dilator, and vascular measurement was performed. A small incision was made on the right anterior chest wall and a subcutaneous pocket fashioned. The power-injectable port was positioned and its catheter tunneled to the right IJ dermatotomy site. The transitional dilator was exchanged over an Amplatz wire for a peel-away sheath, through which the port catheter, which had been trimmed to the appropriate length, was advanced and positioned under fluoroscopy with its tip at the cavoatrial junction. Spot chest radiograph confirms good catheter position and no pneumothorax. The port was flushed per protocol. The pocket was closed with deep interrupted and subcuticular continuous 3-0 Monocryl sutures. The incisions were covered with Dermabond then covered with a sterile dressing. The patient tolerated the procedure well. COMPLICATIONS: COMPLICATIONS None immediate IMPRESSION: Technically successful right IJ power-injectable port catheter placement. Ready for routine use. Electronically Signed   By: Lucrezia Europe M.D.   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 PET 08/28/2021-metastatic disease noted in the liver/left hepatic lobe; hypermetabolic gastrohepatic lymph node; and also left scapular metastases with destruction of the bone [see below].  NGS negative for any targeted options at this time; PDL-1=0. Currently on [place] etoposide-Tecentriq.  # proceed with [place] etoposide-Tecentriq #2  today. Labs today reviewed;  acceptable for treatment today. Again reviewed the palliative nature of the chemotherapy indefinite-until toxicity/progression of disease.  We will plan imaging after 3 cycles.  # mets to bone/ Pain right shoulder- currenrly s/p RT [ 09/16/2021]; continue oxycodone 10 mg every 6 hours as needed- refilled.   Continue MS Contin 15 mg TID.   # constipation: recommend Miralax BID; fluids; Dulcolax.  Marland Kitchen   #IV access: s/p pt reluctant with port placement; finally agrees.  # DISPOSITION: # Chemo today;  # undenyca- 1/06#  # follow up in Jan 30th- MD- port lab,  [place], etop, tecentriq; d-2 & d-3 etop; D-4 Udenyca-Dr.B      All questions were answered. The patient knows to call the clinic with any problems, questions or concerns.    Cammie Sickle, MD 10/13/2021 12:31 PM

## 2021-10-14 ENCOUNTER — Inpatient Hospital Stay: Payer: Medicaid Other

## 2021-10-14 ENCOUNTER — Encounter: Payer: Self-pay | Admitting: *Deleted

## 2021-10-14 VITALS — BP 113/65 | HR 62 | Temp 98.0°F | Resp 16

## 2021-10-14 DIAGNOSIS — C3411 Malignant neoplasm of upper lobe, right bronchus or lung: Secondary | ICD-10-CM | POA: Diagnosis not present

## 2021-10-14 MED ORDER — HEPARIN SOD (PORK) LOCK FLUSH 100 UNIT/ML IV SOLN
500.0000 [IU] | Freq: Once | INTRAVENOUS | Status: AC | PRN
  Administered 2021-10-14: 500 [IU]
  Filled 2021-10-14: qty 5

## 2021-10-14 MED ORDER — SODIUM CHLORIDE 0.9 % IV SOLN
10.0000 mg | Freq: Once | INTRAVENOUS | Status: AC
  Administered 2021-10-14: 10 mg via INTRAVENOUS
  Filled 2021-10-14: qty 10

## 2021-10-14 MED ORDER — SODIUM CHLORIDE 0.9 % IV SOLN
100.0000 mg/m2 | Freq: Once | INTRAVENOUS | Status: AC
  Administered 2021-10-14: 180 mg via INTRAVENOUS
  Filled 2021-10-14: qty 9

## 2021-10-14 MED ORDER — HEPARIN SOD (PORK) LOCK FLUSH 100 UNIT/ML IV SOLN
INTRAVENOUS | Status: AC
  Filled 2021-10-14: qty 5

## 2021-10-14 MED ORDER — SODIUM CHLORIDE 0.9 % IV SOLN
Freq: Once | INTRAVENOUS | Status: AC
  Filled 2021-10-14: qty 250

## 2021-10-14 NOTE — Progress Notes (Signed)
Nutrition Follow-up:  Patient with recurrent lung cancer.  Patient receiving chemotherapy and radiation.  Met with patient during infusion.  Patient reports that she is eating.  Eating peanut butter crackers during visit.  Says she ate salmon cakes and potato salad last night for dinner.  Lunch was [place] sandwich.  Usually has about 2 meals per day. Tries to drink 2 glucerna shakes when she has them.    Reports constipation.  Taking miralax and dulcolax.  Had bowel movement yesterday evening.     Medications: reviewed  Labs: reviewed  Anthropometrics:   Weight 157 lb 1/3 153 lb on 12/14 152 lb on 12/5 155 lb on 11/21   NUTRITION DIAGNOSIS: Inadequate oral intake stable   INTERVENTION:  Provided samples of glucerna and coupons Encouraged patient to continue eating high calorie, high protein foods for weight maintenance    MONITORING, EVALUATION, GOAL: weight trends, intake   NEXT VISIT: Wednesday, Feb 1 during infusion  Stacie Kennedy B. [name], [place], [place] Registered Dietitian [phone] (mobile)

## 2021-10-14 NOTE — Patient Instructions (Signed)
Docusate Capsules Quel est ce mdicament ? Le DOCUSATE prvient et traite la constipation occasionnelle. Il agit comme un mollient fcal, ce qui facilite le passage  la selle. Il appartient  une famille de mdicaments appels laxatifs. Ce mdicament peut tre utilis pour d'autres indications ; adressez-vous  votre mdecin ou  votre pharmacien si vous [employer] questions. NOM(S) DE MARQUE COURANT(S) : BeneHealth Stool Softner, Colace, Colace Clear, Correctol, D.O.S., DC, Doc-Q-Lace, DocuLace, Docusoft S, DOK, DOK Extra Strength, Dulcolax, Genasoft, Kao-Tin, Kaopectate Liqui-Gels, KeySpan Softener, Plus PHARMA, Stool Softener, Stool Softner DC, Sulfolax, Sur-Q-Lax, Surfak, Uni-Ease Que dois-je dire  mon fournisseur de [employer] de prendre ce mdicament ? Ils ont besoin de savoir si vous prsentez lune quelconque des affections ou situations suivantes : Nauses ou vomissements Constipation svre Douleurs  l?estomac Modification soudaine du transit intestinal persistant pendant plus de 2 semaines Raction inhabituelle ou allergique au docusate,  dautres mdicaments,  des aliments,  des colorants ou  des conservateurs [place] ou projet de grossesse Allaitement Comment dois-je utiliser ce mdicament ? Prenez ce mdicament par [employer] un verre d'eau. Suivez les indications mentionnes sur l'emballage ou l'tiquette de prescription. Prenez ce mdicament  intervalles rguliers. Ne prenez pas ce mdicament plus souvent que ce qui vous a t prescrit. Pour toute information relative  lutilisation de ce mdicament chez lenfant, consultez votre quipe de [place]. Bien que ce mdicament puisse tre prescrit, pour certaines affections,  des enfants gs de 2 ans seulement, des prcautions s'imposent. Surdosage : si vous pensez avoir pris ce mdicament en excs, contactez immdiatement un centre [employer]. REMARQUE : ce mdicament vous est uniquement  destin. Ne partagez pas ce Location manager. Que faire si je saute une dose ? Si vous avez saut une dose, prenez-la ds que possible. S'il est presque l'heure de votre prochaine dose, ne prenez que cette dose-l. Ne prenez ni doubles doses, ni doses supplmentaires. Quelles sont les interactions possibles avec ce mdicament ? Huile minrale Cette liste peut ne pas dcrire toutes les interactions mdicamenteuses possibles. Donnez  votre fournisseur de [employer] de tous les mdicaments, plantes mdicinales, mdicaments en vente libre ou complments alimentaires que vous prenez. Dites-lui aussi si vous fumez, buvez de l'alcool ou consommez des drogues illicites. Certaines substances peuvent interagir Physiological scientist. Que dois-je Museum/gallery exhibitions officer par ce mdicament ? Ne lutilisez pas plus de 1 semaine sans demander conseil  votre quipe de [place]. Si vous tes de nouveau constip(e), consultez votre quipe de [place]. Buvez beaucoup d'eau Radiographer, therapeutic par [employer]. Boire de l'eau contribue  diminuer la constipation. Si vous prsentez Designer, industrial/product rectal ou si vous nallez pas  la selle aprs avoir pris ce mdicament, cessez de le prendre et Nurse, learning disability votre quipe de soins. Ces symptmes pourraient indiquer un tat pathologique plus grave. Quels effets secondaires puis-je remarquer en prenant ce mdicament ? Effets secondaires que vous devez signaler  votre quipe de soins ds que possible : Ractions allergiques : ruption cutane, dmangeaisons, urticaire, gonflement du visage, des lvres, de la langue ou de la gorge Effets secondaires ne ncessitant gnralement pas un avis mdical (signalez-les  votre quipe de soins sils persistent ou sont gnants) : Diarrhe Douleurs  l?estomac Cette liste peut ne pas dcrire tous les effets secondaires possibles. Appelez votre mdecin pour [employer] sujet des [employer]. Vous pouvez signaler les effets secondaires  la FDA, au [phone]. O dois-je conserver  mon mdicament ? Conserver hors de la porte des enfants ou des animaux de compagnie. Conserver  une temprature ambiante comprise entre 15 et 30 C (92 et 1 F). Jeter tout mdicament inutilis aprs la date de premption figurant sur l'tiquette ou sur Risk manager. REMARQUE : cette notice est un rsum. Elle peut ne pas contenir toutes les informations possibles. Si vous avez des questions  propos de ce mdicament, Secretary/administrator mdecin, votre pharmacien ou votre fournisseur de [place].  2022 Elsevier/Gold Standard (2021-04-29 00:00:00)

## 2021-10-15 ENCOUNTER — Encounter: Payer: Self-pay | Admitting: Internal Medicine

## 2021-10-15 ENCOUNTER — Inpatient Hospital Stay: Payer: Medicaid Other

## 2021-10-15 ENCOUNTER — Other Ambulatory Visit: Payer: Self-pay

## 2021-10-15 VITALS — BP 111/69 | HR 54 | Temp 98.1°F | Resp 19

## 2021-10-15 DIAGNOSIS — C3411 Malignant neoplasm of upper lobe, right bronchus or lung: Secondary | ICD-10-CM | POA: Diagnosis not present

## 2021-10-15 MED ORDER — HEPARIN SOD (PORK) LOCK FLUSH 100 UNIT/ML IV SOLN
INTRAVENOUS | Status: AC
  Administered 2021-10-15: 500 [IU]
  Filled 2021-10-15: qty 5

## 2021-10-15 MED ORDER — SODIUM CHLORIDE 0.9 % IV SOLN
100.0000 mg/m2 | Freq: Once | INTRAVENOUS | Status: AC
  Administered 2021-10-15: 180 mg via INTRAVENOUS
  Filled 2021-10-15: qty 9

## 2021-10-15 MED ORDER — SODIUM CHLORIDE 0.9 % IV SOLN
Freq: Once | INTRAVENOUS | Status: AC
  Filled 2021-10-15: qty 250

## 2021-10-15 MED ORDER — SODIUM CHLORIDE 0.9 % IV SOLN
10.0000 mg | Freq: Once | INTRAVENOUS | Status: AC
  Administered 2021-10-15: 10 mg via INTRAVENOUS
  Filled 2021-10-15: qty 10

## 2021-10-15 MED ORDER — SODIUM CHLORIDE 0.9% FLUSH
10.0000 mL | Freq: Once | INTRAVENOUS | Status: AC
  Administered 2021-10-15: 10 mL via INTRAVENOUS
  Filled 2021-10-15: qty 10

## 2021-10-15 MED ORDER — HEPARIN SOD (PORK) LOCK FLUSH 100 UNIT/ML IV SOLN
500.0000 [IU] | Freq: Once | INTRAVENOUS | Status: AC | PRN
  Filled 2021-10-15: qty 5

## 2021-10-15 NOTE — Progress Notes (Signed)
Met with patient during infusion to follow up on any questions or needs. Pt wanted to confirm that her pain medication has been sent into her pharmacy and also would like to know the results of her blood work when she saw Josh before the holiday. Advised pt that her pain medication for oxycodone has been submitted by Dr. Jacinto Reap to [facility]. Informed pt of results of blood work checked by [employer]. Instructed pt to call with any further questions or needs. Pt verbalized understanding.

## 2021-10-16 ENCOUNTER — Other Ambulatory Visit: Payer: Self-pay | Admitting: *Deleted

## 2021-10-16 ENCOUNTER — Inpatient Hospital Stay: Payer: Medicaid Other

## 2021-10-16 DIAGNOSIS — C3411 Malignant neoplasm of upper lobe, right bronchus or lung: Secondary | ICD-10-CM | POA: Diagnosis not present

## 2021-10-16 MED ORDER — LORATADINE 10 MG PO TABS: 10.0000 mg | ORAL_TABLET | Freq: Every day | ORAL | 2 refills | Status: DC

## 2021-10-16 MED ORDER — PEGFILGRASTIM-CBQV 6 MG/0.6ML ~~LOC~~ SOSY
6.0000 mg | PREFILLED_SYRINGE | Freq: Once | SUBCUTANEOUS | Status: AC
  Administered 2021-10-16: 6 mg via SUBCUTANEOUS
  Filled 2021-10-16: qty 0.6

## 2021-10-16 NOTE — Telephone Encounter (Signed)
Pt came in to receive udenyca injection and requested prescription for claritin be sent into [facility] where pt receives medication assistance through charitable funds.

## 2021-10-19 ENCOUNTER — Ambulatory Visit: Payer: MEDICAID | Admitting: Radiation Oncology

## 2021-10-20 ENCOUNTER — Encounter: Payer: Self-pay | Admitting: Internal Medicine

## 2021-10-27 ENCOUNTER — Other Ambulatory Visit: Payer: Self-pay

## 2021-10-27 ENCOUNTER — Other Ambulatory Visit: Payer: Medicaid Other | Admitting: Nurse Practitioner

## 2021-10-27 ENCOUNTER — Telehealth: Payer: Self-pay | Admitting: Nurse Practitioner

## 2021-10-27 NOTE — Telephone Encounter (Signed)
I called [name], she had left a message to cancel the [facility] consult, referral. I confirmed with [name] this was her wish to cancel initial appointment today and further consults Notified PC Admit.

## 2021-11-02 ENCOUNTER — Encounter: Payer: Self-pay | Admitting: Internal Medicine

## 2021-11-03 ENCOUNTER — Encounter: Payer: Self-pay | Admitting: Internal Medicine

## 2021-11-05 NOTE — Progress Notes (Signed)
..  Patient Assist/Replace for the following has been terminated. Medication: Tecentriq Huey Bienenstock) Reason for Termination: Full coverage Medicaid as of 10/11/2021, assistance no longer valid.  Last DOS: 10/13/2021.  Marland KitchenJuan Quam, CPhT IV Drug Replacement Specialist [place] Phone: [phone]

## 2021-11-05 NOTE — Progress Notes (Addendum)
..  Patient Assist/Replace for the following has been terminated. Medication: Udenyca (Pegfilgrastin-cbqv) Reason for Termination: Full coverage Medicaid as of 10/11/2021, assistance no longer valid. Last DOS: 10/16/2021.  Marland KitchenJuan Quam, CPhT IV Drug Replacement Specialist [place] Phone: [phone]

## 2021-11-06 ENCOUNTER — Encounter: Payer: Self-pay | Admitting: Internal Medicine

## 2021-11-09 ENCOUNTER — Other Ambulatory Visit: Payer: Self-pay

## 2021-11-09 ENCOUNTER — Encounter: Payer: Self-pay | Admitting: Internal Medicine

## 2021-11-09 ENCOUNTER — Inpatient Hospital Stay: Payer: Medicaid Other

## 2021-11-09 ENCOUNTER — Inpatient Hospital Stay (HOSPITAL_BASED_OUTPATIENT_CLINIC_OR_DEPARTMENT_OTHER): Payer: Medicaid Other | Admitting: Internal Medicine

## 2021-11-09 DIAGNOSIS — C3411 Malignant neoplasm of upper lobe, right bronchus or lung: Secondary | ICD-10-CM | POA: Diagnosis not present

## 2021-11-09 LAB — COMPREHENSIVE METABOLIC PANEL
ALT: 10 U/L (ref 0–44)
AST: 17 U/L (ref 15–41)
Albumin: 3.8 g/dL (ref 3.5–5.0)
Alkaline Phosphatase: 92 U/L (ref 38–126)
Anion gap: 9 (ref 5–15)
BUN: 11 mg/dL (ref 6–20)
CO2: 25 mmol/L (ref 22–32)
Calcium: 9.3 mg/dL (ref 8.9–10.3)
Chloride: 103 mmol/L (ref 98–111)
Creatinine, Ser: 0.6 mg/dL (ref 0.44–1.00)
GFR, Estimated: >60 mL/min (ref >60)
Glucose, Bld: 105 mg/dL — ABNORMAL HIGH (ref 70–99)
Potassium: 3.5 mmol/L (ref 3.5–5.1)
Sodium: 137 mmol/L (ref 135–145)
Total Bilirubin: 0.3 mg/dL (ref 0.3–1.2)
Total Protein: 7.4 g/dL (ref 6.5–8.1)

## 2021-11-09 LAB — CBC WITH DIFFERENTIAL/PLATELET
Abs Immature Granulocytes: 0.06 10*3/uL (ref 0.00–0.07)
Basophils Absolute: 0.1 10*3/uL (ref 0.0–0.1)
Basophils Relative: 1 %
Eosinophils Absolute: 0.1 10*3/uL (ref 0.0–0.5)
Eosinophils Relative: 1 %
HCT: 40.6 % (ref 36.0–46.0)
Hemoglobin: 13.8 g/dL (ref 12.0–15.0)
Immature Granulocytes: 1 %
Lymphocytes Relative: 29 %
Lymphs Abs: 3.2 10*3/uL (ref 0.7–4.0)
MCH: 29.6 pg (ref 26.0–34.0)
MCHC: 34 g/dL (ref 30.0–36.0)
MCV: 86.9 fL (ref 80.0–100.0)
Monocytes Absolute: 1.5 10*3/uL — ABNORMAL HIGH (ref 0.1–1.0)
Monocytes Relative: 13 %
Neutro Abs: 6.1 10*3/uL (ref 1.7–7.7)
Neutrophils Relative %: 55 %
Platelets: 475 10*3/uL — ABNORMAL HIGH (ref 150–400)
RBC: 4.67 MIL/uL (ref 3.87–5.11)
RDW: 15.9 % — ABNORMAL HIGH (ref 11.5–15.5)
WBC: 11 10*3/uL — ABNORMAL HIGH (ref 4.0–10.5)
nRBC: 0 % (ref 0.0–0.2)

## 2021-11-09 LAB — TSH: TSH: 2.263 u[IU]/mL (ref 0.350–4.500)

## 2021-11-09 MED ORDER — PALONOSETRON HCL INJECTION 0.25 MG/5ML
0.2500 mg | Freq: Once | INTRAVENOUS | Status: AC
  Administered 2021-11-09: 0.25 mg via INTRAVENOUS
  Filled 2021-11-09: qty 5

## 2021-11-09 MED ORDER — SODIUM CHLORIDE 0.9 % IV SOLN
1200.0000 mg | Freq: Once | INTRAVENOUS | Status: AC
  Administered 2021-11-09: 1200 mg via INTRAVENOUS
  Filled 2021-11-09: qty 20

## 2021-11-09 MED ORDER — HEPARIN SOD (PORK) LOCK FLUSH 100 UNIT/ML IV SOLN
500.0000 [IU] | Freq: Once | INTRAVENOUS | Status: AC | PRN
  Administered 2021-11-09: 500 [IU]
  Filled 2021-11-09: qty 5

## 2021-11-09 MED ORDER — SODIUM CHLORIDE 0.9 % IV SOLN
Freq: Once | INTRAVENOUS | Status: AC
  Filled 2021-11-09: qty 250

## 2021-11-09 MED ORDER — SODIUM CHLORIDE 0.9 % IV SOLN
540.0000 mg | Freq: Once | INTRAVENOUS | Status: AC
  Administered 2021-11-09: 540 mg via INTRAVENOUS
  Filled 2021-11-09: qty 54

## 2021-11-09 MED ORDER — SODIUM CHLORIDE 0.9 % IV SOLN
10.0000 mg | Freq: Once | INTRAVENOUS | Status: AC
  Administered 2021-11-09: 10 mg via INTRAVENOUS
  Filled 2021-11-09: qty 10

## 2021-11-09 MED ORDER — SODIUM CHLORIDE 0.9 % IV SOLN
150.0000 mg | Freq: Once | INTRAVENOUS | Status: AC
  Administered 2021-11-09: 150 mg via INTRAVENOUS
  Filled 2021-11-09: qty 150

## 2021-11-09 MED ORDER — SODIUM CHLORIDE 0.9 % IV SOLN
100.0000 mg/m2 | Freq: Once | INTRAVENOUS | Status: AC
  Administered 2021-11-09: 180 mg via INTRAVENOUS
  Filled 2021-11-09: qty 9

## 2021-11-09 NOTE — Progress Notes (Signed)
[facility] CONSULT NOTE  Patient Care Team: Cammie Sickle, MD as PCP - General (Internal Medicine) Telford Nab, RN as Oncology Nurse Navigator  CHIEF COMPLAINTS/PURPOSE OF CONSULTATION: lung cancer  #  Oncology History Overview Note  # NOV -[id number] [facility] CANCER SCREENING PROGRAM]-18 mm right upper lobe lung nodule; DEC 2021- s/p right upper lobectomy [Dr. Roxan Hockey; [place]]; STAGE: I [pT-18 mm; LN-12=0]; predominant large cell neuroendocrine; minor adenocarcinoma.  Declines adjuvant chemotherapy.  #Recurrent/stage IV cancer NOV 2022- PET scan-scapular lesion liver lesion gastrohepatic lymphadenopathy.  11/21- start RT to right scapular lesion  # DEC 3rd, 2022- CARBO=ETOP+TECEN; udenyca #1.   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  Visceral Pleura Invasion: Not identified  Direct Invasion of Adjacent Structures: No adjacent structures present  Lymphovascular Invasion: Not identified  Margins: All margins negative for invasive carcinoma       Closest Margin(s) to Invasive Carcinoma: Bronchovascular margin  Treatment Effect: No known presurgical therapy  Regional Lymph Nodes:       Number of Lymph Nodes Involved: 0                            Nodal Sites with Tumor: Not applicable       Number of Lymph Nodes Examined: 12      Primary cancer of right upper lobe of lung ([place])  11/03/2020 Initial Diagnosis   Primary cancer of right upper lobe of lung ([place])   11/03/2020 Cancer Staging   Staging form: Lung, AJCC 8th Edition - Pathologic: Stage IA3 (pT1c, pN0, cM0) - Signed by Cammie Sickle, MD on 11/04/2020     09/14/2021 -  Chemotherapy   Patient is on Treatment Plan : LUNG SCLC Carboplatin + Etoposide + Atezolizumab Induction q21d / Atezolizumab Maintenance q21d     11/09/2021 Cancer Staging   Staging form: Lung, AJCC 8th Edition - Pathologic: Stage IVB (pTX, pNX, cM1c) - Signed by Cammie Sickle, MD on 11/09/2021       HISTORY OF PRESENTING ILLNESS: Ambulating independently.  Alone.   Stacie Kennedy 61 y.o.  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  Patient states her right shoulder pain improved.  Patient states to be on MS Contin  one a day;  oxycodone up to 2 a day.  No nausea no vomiting.  No headaches. Chronic shortness of breath/chronic mild cough no hemoptysis.   Review of Systems  Constitutional:  Negative for chills, diaphoresis, fever, malaise/fatigue and weight loss.  HENT:  Negative for nosebleeds and sore throat.   Eyes:  Negative for double vision.  Respiratory:  Negative for cough, hemoptysis, sputum production, shortness of breath and wheezing.   Cardiovascular:  Negative for chest pain, palpitations, orthopnea and leg swelling.  Gastrointestinal:  Negative for abdominal pain, blood in stool, constipation, diarrhea, heartburn, melena, nausea and vomiting.  Genitourinary:  Negative for dysuria, frequency and urgency.  Musculoskeletal:  Positive for back pain, joint pain and myalgias.  Skin: Negative.  Negative for itching and rash.  Neurological:  Negative for  dizziness, tingling, focal weakness, weakness and headaches.  Endo/Heme/Allergies:  Does not bruise/bleed easily.  Psychiatric/Behavioral:  Negative for depression. The patient is not nervous/anxious and does not have insomnia.     MEDICAL HISTORY:  Past Medical History:  Diagnosis Date   Cancer [facility])    lung cancer   Depression    Duodenitis    Dyspnea    Family history of  cancer    High cholesterol    Personal history of colonic polyps    Pre-diabetes    Umbilical hernia    UTI (urinary tract infection)     SURGICAL HISTORY: Past Surgical History:  Procedure Laterality Date   COLONOSCOPY WITH PROPOFOL N/A 03/24/2021   Procedure: COLONOSCOPY WITH PROPOFOL;  Surgeon: Jonathon Bellows, MD;  Location: [facility] ENDOSCOPY;  Service: Gastroenterology;  Laterality: N/A;   INTERCOSTAL NERVE BLOCK  09/19/2020   Procedure: INTERCOSTAL NERVE BLOCK;  Surgeon: Melrose Nakayama, MD;  Location: [facility];  Service: Thoracic;;   IR IMAGING GUIDED PORT INSERTION  09/23/2021   LUNG LOBECTOMY Right    LUNG REMOVAL, PARTIAL Right 09/19/2020   NODE DISSECTION Right 09/19/2020   Procedure: NODE DISSECTION;  Surgeon: Melrose Nakayama, MD;  Location: [facility];  Service: Thoracic;  Laterality: Right;   VIDEO BRONCHOSCOPY N/A 07/08/2021   Procedure: VIDEO BRONCHOSCOPY;  Surgeon: Melrose Nakayama, MD;  Location[facility] OR;  Service: Thoracic;  Laterality: N/A;    SOCIAL HISTORY: Social History   Socioeconomic History   Marital status: Single    Spouse name: Not on file   Number of children: Not on file   Years of education: Not on file   Highest education level: Not on file  Occupational History   Not on file  Tobacco Use   Smoking status: Some Days    Packs/day: 0.25    Years: 43.00    Pack years: 10.75    Types: Cigarettes   Smokeless tobacco: Never   Tobacco comments:    states she is slowing, trying to quit  Vaping Use   Vaping Use: Never used  Substance and Sexual Activity   Alcohol use: Yes    Alcohol/week: 2.0 standard drinks    Types: 2 Cans of beer per week    Comment: occasional   Drug use: No   Sexual activity: Not on file  Other Topics Concern   Not on file  Social History Narrative   Lives in [place]; smokes; now and then beer; with boy friend. 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    Social Determinants of  Health   Financial Resource Strain: Not on file  Food Insecurity: Food Insecurity Present   Worried About [place] in the Last Year: Sometimes true   Ran Out of Food in the Last Year: Sometimes true  Transportation Needs: No Transportation Needs   Lack of Transportation (Medical): No   Lack of Transportation (Non-Medical): No  Physical Activity: Insufficiently Active   Days of Exercise per Week: 7 days   Minutes of Exercise per Session: 20 min  Stress: Stress Concern Present   Feeling of Stress : To some extent  Social Connections: Socially Isolated   Frequency of Communication with Friends and Family: Never   Frequency of Social Gatherings with Friends and Family: Never   Attends Religious Services: Never   Marine scientist or Organizations: No   Attends Archivist Meetings: Never   Marital Status: Never married  Intimate Partner Violence: Not on file  FAMILY HISTORY: Family History  Problem Relation Age of Onset   Diabetes Mother    Cancer Mother    Diabetes Father    Stroke Father    Heart attack Father        81s   Healthy Sister    Cancer Brother    Hepatitis Brother    Diabetes Brother    Hypertension Brother    Heart disease Brother     ALLERGIES:  has No Known Allergies.  MEDICATIONS:  Current Outpatient Medications  Medication Sig Dispense Refill   albuterol (VENTOLIN HFA) 108 (90 Base) MCG/ACT inhaler Inhale 2 puffs into the lungs every 6 (six) hours as needed for wheezing or shortness of breath. 6.7 g 2   aspirin EC 81 MG tablet Take 1 tablet (81 mg total) by mouth daily. Swallow whole. 30 tablet 11   lidocaine-prilocaine (EMLA) cream Apply one application topically the the affected area(s) daily as needed. 30 g 3   loratadine (CLARITIN) 10 MG tablet Take 1 tablet (10 mg total) by mouth daily. 30 tablet 2   metFORMIN (GLUCOPHAGE) 500 MG tablet TAKE ONE TABLET BY MOUTH EVERY MORNING WITH BREAKFAST 30  tablet 3   morphine (MS CONTIN) 15 MG 12 hr tablet Take 1 tablet (15 mg total) by mouth every 8 (eight) hours. 90 tablet 0   Oxycodone HCl 10 MG TABS Take 1 tablet (10 mg total) by mouth every 6 (six) hours as needed (pain). 60 tablet 0   buPROPion (WELLBUTRIN SR) 150 MG 12 hr tablet Take 1 tablet (150 mg total) by mouth 2 (two) times daily. (Patient not taking: Reported on 10/13/2021) 60 tablet 0   ondansetron (ZOFRAN) 4 MG tablet TAKE 2 TABLETS (8MG) BY MOUTH ONCE EVERY 8 HOURS AS NEEDED FOR NAUSEA OR VOMITING. (Patient not taking: Reported on 11/09/2021) 80 tablet 1   prochlorperazine (COMPAZINE) 10 MG tablet Take 1 tablet (10 mg total) by mouth once every 6 (six) hours as needed for nausea or vomiting. (Patient not taking: Reported on 11/09/2021) 40 tablet 1   rosuvastatin (CRESTOR) 10 MG tablet TAKE ONE TABLET BY MOUTH EVERY DAY (Patient not taking: Reported on 10/13/2021) 30 tablet 3   No current facility-administered medications for this visit.   Facility-Administered Medications Ordered in Other Visits  Medication Dose Route Frequency Provider Last Rate Last Admin   CARBOplatin (PARAPLATIN) 540 mg in sodium chloride 0.9 % 250 mL chemo infusion  540 mg Intravenous Once Charlaine Dalton R, MD       etoposide (VEPESID) 180 mg in sodium chloride 0.9 % 500 mL chemo infusion  100 mg/m2 (Treatment Plan Recorded) Intravenous Once Cammie Sickle, MD 509 mL/hr at 11/09/21 1123 180 mg at 11/09/21 1123   heparin lock flush 100 unit/mL  500 Units Intracatheter Once PRN Cammie Sickle, MD          .  PHYSICAL EXAMINATION: ECOG PERFORMANCE STATUS: 0 - Asymptomatic  Vitals:   11/09/21 0858  BP: 107/73  Pulse: 78  Resp: 16  Temp: 98.6 F (37 C)  SpO2: 95%      Filed Weights   11/09/21 0858  Weight: 154 lb 1.6 oz (69.9 kg)       Physical Exam HENT:     Head: Normocephalic and atraumatic.     Mouth/Throat:     Pharynx: No oropharyngeal exudate.  Eyes:      Pupils: Pupils are equal, round, and reactive to light.  Cardiovascular:     Rate and  Rhythm: Normal rate and regular rhythm.  Pulmonary:     Effort: Pulmonary effort is normal. No respiratory distress.     Breath sounds: Normal breath sounds. No wheezing.  Abdominal:     General: Bowel sounds are normal. There is no distension.     Palpations: Abdomen is soft. There is no mass.     Tenderness: There is no abdominal tenderness. There is no guarding or rebound.  Musculoskeletal:        General: No tenderness. Normal range of motion.     Cervical back: Normal range of motion and neck supple.  Skin:    General: Skin is warm.  Neurological:     Mental Status: She is alert and oriented to person, place, and time.  Psychiatric:        Mood and Affect: Affect normal.     LABORATORY DATA:  I have reviewed the data as listed Lab Results  Component Value Date   WBC 11.0 (H) 11/09/2021   HGB 13.8 11/09/2021   HCT 40.6 11/09/2021   MCV 86.9 11/09/2021   PLT 475 (H) 11/09/2021   Recent Labs    11/26/20 1214 05/20/21 1343 09/28/21 0955 10/13/21 0824 11/09/21 0838  NA 139   < > 134* 137 137  K 4.1   < > 3.7 3.5 3.5  CL 103   < > 102 103 103  CO2 22   < > 24 27 25   GLUCOSE 99   < > 104* 141* 105*  BUN 8   < > 12 12 11   CREATININE 0.69   < > 0.56 0.69 0.60  CALCIUM 9.5   < > 9.0 9.1 9.3  GFRNONAA 96   < > >60 >60 >60  GFRAA 110  --   --   --   --   PROT 6.9   < > 7.2 7.0 7.4  ALBUMIN 4.0   < > 3.6 3.4* 3.8  AST 18   < > 15 15 17   ALT 9   < > 13 9 10   ALKPHOS 113   < > 94 87 92  BILITOT 0.3   < > 0.3 <0.1* 0.3   < > = values in this interval not displayed.    RADIOGRAPHIC STUDIES: I have personally reviewed the radiological images as listed and agreed with the findings in the report. No results found.   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 PET 08/28/2021-metastatic disease noted in the liver/left hepatic lobe; hypermetabolic gastrohepatic lymph node; and also left scapular metastases with destruction of the bone [see below].  NGS negative for any targeted options at this time; PDL-1=0. Currently on [place] etoposide-Tecentriq.  # proceed with [place] etoposide-Tecentriq #3 today. Labs today reviewed;  acceptable for treatment today. Again reviewed the palliative nature of the chemotherapy indefinite-until toxicity/progression of disease.  We will plan imaging after 3 cycles.  # mets to bone/ Pain right shoulder- currenrly s/p RT [ 09/16/2021];Recommend complaince with MS contin BID: and oxycodone 10 mg prn. STABLE.    # constipation: recommend Miralax BID; fluids; Dulcolax.    #IV access: functional  # DISPOSITION: # Chemo today; appt as as planned this week  # follow up in 3 weeks- MD- port lab,  carbo, etop, tecentriq; d-2 & d-3 etop; D-4 Udenyca; CT CAP prior-Dr.B      All questions were answered. The  patient knows to call the clinic with any problems, questions or concerns.    Cammie Sickle, MD 11/09/2021 12:14 PM

## 2021-11-09 NOTE — Assessment & Plan Note (Addendum)
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 PET 08/28/2021-metastatic disease noted in the liver/left hepatic lobe; hypermetabolic gastrohepatic lymph node; and also left scapular metastases with destruction of the bone [see below].  NGS negative for any targeted options at this time; PDL-1=0. Currently on [place] etoposide-Tecentriq.  # proceed with [place] etoposide-Tecentriq #3 today. Labs today reviewed;  acceptable for treatment today. Again reviewed the palliative nature of the chemotherapy indefinite-until toxicity/progression of disease.  We will plan imaging after 3 cycles.  # mets to bone/ Pain right shoulder- currenrly s/p RT [ 09/16/2021];Recommend complaince with MS contin BID: and oxycodone 10 mg prn. STABLE.    # constipation: recommend Miralax BID; fluids; Dulcolax.    #IV access: functional  # DISPOSITION: # Chemo today; appt as as planned this week  # follow up in 3 weeks- MD- port lab,  carbo, etop, tecentriq; d-2 & d-3 etop; D-4 Udenyca; CT CAP prior-Dr.B

## 2021-11-09 NOTE — Patient Instructions (Signed)
[facility] [place]  Discharge Instructions: Thank you for choosing [facility] to provide your oncology and hematology care.  If you have a lab appointment with the [facility], please go directly to the [facility] and check in at the registration area.  Wear comfortable clothing and clothing appropriate for easy access to any Portacath or PICC line.   We strive to give you quality time with your provider. You may need to reschedule your appointment if you arrive late (15 or more minutes).  Arriving late affects you and other patients whose appointments are after yours.  Also, if you miss three or more appointments without notifying the office, you may be dismissed from the clinic at the providers discretion.      For prescription refill requests, have your pharmacy contact our office and allow 72 hours for refills to be completed.    Today you received the following chemotherapy and/or immunotherapy agents TECENTRIQ, VEPSID, CARBOPLATIN      To help prevent nausea and vomiting after your treatment, we encourage you to take your nausea medication as directed.  BELOW ARE SYMPTOMS THAT SHOULD BE REPORTED IMMEDIATELY: *FEVER GREATER THAN 100.4 F (38 C) OR HIGHER *CHILLS OR SWEATING *NAUSEA AND VOMITING THAT IS NOT CONTROLLED WITH YOUR NAUSEA MEDICATION *UNUSUAL SHORTNESS OF BREATH *UNUSUAL BRUISING OR BLEEDING *URINARY PROBLEMS (pain or burning when urinating, or frequent urination) *BOWEL PROBLEMS (unusual diarrhea, constipation, pain near the anus) TENDERNESS IN MOUTH AND THROAT WITH OR WITHOUT PRESENCE OF ULCERS (sore throat, sores in mouth, or a toothache) UNUSUAL RASH, SWELLING OR PAIN  UNUSUAL VAGINAL DISCHARGE OR ITCHING   Items with * indicate a potential emergency and should be followed up as soon as possible or go to the Emergency Department if any problems should occur.  Please show the CHEMOTHERAPY ALERT CARD or IMMUNOTHERAPY ALERT  CARD at check-in to the Emergency Department and triage nurse.  Should you have questions after your visit or need to cancel or reschedule your appointment, please contact [facility] [place] AT [place]  [phone] and follow the prompts.  Office hours are 8:00 a.m. to 4:30 p.m. Monday - Friday. Please note that voicemails left after 4:00 p.m. may not be returned until the following business day.  We are closed weekends and major holidays. You have access to a nurse at all times for urgent questions. Please call the main number to the clinic [phone] and follow the prompts.  For any non-urgent questions, you may also contact your provider using MyChart. We now offer e-Visits for anyone 83 and older to request care online for non-urgent symptoms. For details visit [email or web address].   Also download the MyChart app! Go to the app store, search "MyChart", open the app, select [facility], and log in with your MyChart username and password.  Due to Covid, a mask is required upon entering the hospital/clinic. If you do not have a mask, one will be given to you upon arrival. For doctor visits, patients may have 1 support person aged 9 or older with them. For treatment visits, patients cannot have anyone with them due to current Covid guidelines and our immunocompromised population.   Atezolizumab injection What is this medication? ATEZOLIZUMAB (a te zoe LIZ ue mab) is a monoclonal antibody. It is used to treat bladder cancer (urothelial cancer), liver cancer, lung cancer, and melanoma. This medicine may be used for other purposes; ask your health care provider or pharmacist if you have questions. COMMON BRAND  NAME(S): Tecentriq What should I tell my care team before I take this medication? They need to know if you have any of these conditions: autoimmune diseases like Crohn's disease, ulcerative colitis, or lupus have had or planning to have an allogeneic stem cell transplant  (uses someone else's stem cells) history of organ transplant history of radiation to the chest nervous system problems like myasthenia gravis or Guillain-Barre syndrome an unusual or allergic reaction to atezolizumab, other medicines, foods, dyes, or preservatives pregnant or trying to get pregnant breast-feeding How should I use this medication? This medicine is for infusion into a vein. It is given by a health care professional in a hospital or clinic setting. A special MedGuide will be given to you before each treatment. Be sure to read this information carefully each time. Talk to your pediatrician regarding the use of this medicine in children. Special care may be needed. Overdosage: If you think you have taken too much of this medicine contact a poison control center or emergency room at once. NOTE: This medicine is only for you. Do not share this medicine with others. What if I miss a dose? It is important not to miss your dose. Call your doctor or health care professional if you are unable to keep an appointment. What may interact with this medication? Interactions have not been studied. This list may not describe all possible interactions. Give your health care provider a list of all the medicines, herbs, non-prescription drugs, or dietary supplements you use. Also tell them if you smoke, drink alcohol, or use illegal drugs. Some items may interact with your medicine. What should I watch for while using this medication? Your condition will be monitored carefully while you are receiving this medicine. You may need blood work done while you are taking this medicine. Do not become pregnant while taking this medicine or for at least 5 months after stopping it. Women should inform their doctor if they wish to become pregnant or think they might be pregnant. There is a potential for serious side effects to an unborn child. Talk to your health care professional or pharmacist for more  information. Do not breast-feed an infant while taking this medicine or for at least 5 months after the last dose. What side effects may I notice from receiving this medication? Side effects that you should report to your doctor or health care professional as soon as possible: allergic reactions like skin rash, itching or hives, swelling of the face, lips, or tongue black, tarry stools bloody or watery diarrhea breathing problems changes in vision chest pain or chest tightness chills facial flushing fever headache signs and symptoms of high blood sugar such as dizziness; dry mouth; dry skin; fruity breath; nausea; stomach pain; increased hunger or thirst; increased urination signs and symptoms of liver injury like dark yellow or brown urine; general ill feeling or flu-like symptoms; light-colored stools; loss of appetite; nausea; right upper belly pain; unusually weak or tired; yellowing of the eyes or skin stomach pain trouble passing urine or change in the amount of urine Side effects that usually do not require medical attention (report to your doctor or health care professional if they continue or are bothersome): bone pain cough diarrhea joint pain muscle pain muscle weakness swelling of arms or legs tiredness weight loss This list may not describe all possible side effects. Call your doctor for medical advice about side effects. You may report side effects to FDA at 1-800-FDA-1088. Where should I keep my medication?  This drug is given in a hospital or clinic and will not be stored at home. NOTE: This sheet is a summary. It may not cover all possible information. If you have questions about this medicine, talk to your doctor, pharmacist, or health care provider.  2022 Elsevier/Gold Standard (2021-06-16 00:00:00)  Etoposide, VP-16 injection What is this medication? ETOPOSIDE, VP-16 (e toe POE side) is a chemotherapy drug. It is used to treat testicular cancer, lung cancer, and  other cancers. This medicine may be used for other purposes; ask your health care provider or pharmacist if you have questions. COMMON BRAND NAME(S): Etopophos, Toposar, VePesid What should I tell my care team before I take this medication? They need to know if you have any of these conditions: infection kidney disease liver disease low blood counts, like low white cell, platelet, or red cell counts an unusual or allergic reaction to etoposide, other medicines, foods, dyes, or preservatives pregnant or trying to get pregnant breast-feeding How should I use this medication? This medicine is for infusion into a vein. It is administered in a hospital or clinic by a specially trained health care professional. Talk to your pediatrician regarding the use of this medicine in children. Special care may be needed. Overdosage: If you think you have taken too much of this medicine contact a poison control center or emergency room at once. NOTE: This medicine is only for you. Do not share this medicine with others. What if I miss a dose? It is important not to miss your dose. Call your doctor or health care professional if you are unable to keep an appointment. What may interact with this medication? This medicine may interact with the following medications: warfarin This list may not describe all possible interactions. Give your health care provider a list of all the medicines, herbs, non-prescription drugs, or dietary supplements you use. Also tell them if you smoke, drink alcohol, or use illegal drugs. Some items may interact with your medicine. What should I watch for while using this medication? Visit your doctor for checks on your progress. This drug may make you feel generally unwell. This is not uncommon, as chemotherapy can affect healthy cells as well as cancer cells. Report any side effects. Continue your course of treatment even though you feel ill unless your doctor tells you to stop. In  some cases, you may be given additional medicines to help with side effects. Follow all directions for their use. Call your doctor or health care professional for advice if you get a fever, chills or sore throat, or other symptoms of a cold or flu. Do not treat yourself. This drug decreases your body's ability to fight infections. Try to avoid being around people who are sick. This medicine may increase your risk to bruise or bleed. Call your doctor or health care professional if you notice any unusual bleeding. Talk to your doctor about your risk of cancer. You may be more at risk for certain types of cancers if you take this medicine. Do not become pregnant while taking this medicine or for at least 6 months after stopping it. Women should inform their doctor if they wish to become pregnant or think they might be pregnant. Women of child-bearing potential will need to have a negative pregnancy test before starting this medicine. There is a potential for serious side effects to an unborn child. Talk to your health care professional or pharmacist for more information. Do not breast-feed an infant while taking this medicine. Men  must use a latex condom during sexual contact with a woman while taking this medicine and for at least 4 months after stopping it. A latex condom is needed even if you have had a vasectomy. Contact your doctor right away if your partner becomes pregnant. Do not donate sperm while taking this medicine and for at least 4 months after you stop taking this medicine. Men should inform their doctors if they wish to father a child. This medicine may lower sperm counts. What side effects may I notice from receiving this medication? Side effects that you should report to your doctor or health care professional as soon as possible: allergic reactions like skin rash, itching or hives, swelling of the face, lips, or tongue low blood counts - this medicine may decrease the number of white blood  cells, red blood cells, and platelets. You may be at increased risk for infections and bleeding nausea, vomiting redness, blistering, peeling or loosening of the skin, including inside the mouth signs and symptoms of infection like fever; chills; cough; sore throat; pain or trouble passing urine signs and symptoms of low red blood cells or anemia such as unusually weak or tired; feeling faint or lightheaded; falls; breathing problems unusual bruising or bleeding Side effects that usually do not require medical attention (report to your doctor or health care professional if they continue or are bothersome): changes in taste diarrhea hair loss loss of appetite mouth sores This list may not describe all possible side effects. Call your doctor for medical advice about side effects. You may report side effects to FDA at 1-800-FDA-1088. Where should I keep my medication? This drug is given in a hospital or clinic and will not be stored at home. NOTE: This sheet is a summary. It may not cover all possible information. If you have questions about this medicine, talk to your doctor, pharmacist, or health care provider.  2022 Elsevier/Gold Standard (2021-06-16 00:00:00)  Carboplatin injection What is this medication? CARBOPLATIN (KAR boe pla tin) is a chemotherapy drug. It targets fast dividing cells, like cancer cells, and causes these cells to die. This medicine is used to treat ovarian cancer and many other cancers. This medicine may be used for other purposes; ask your health care provider or pharmacist if you have questions. COMMON BRAND NAME(S): Paraplatin What should I tell my care team before I take this medication? They need to know if you have any of these conditions: blood disorders hearing problems kidney disease recent or ongoing radiation therapy an unusual or allergic reaction to carboplatin, cisplatin, other chemotherapy, other medicines, foods, dyes, or preservatives pregnant or  trying to get pregnant breast-feeding How should I use this medication? This drug is usually given as an infusion into a vein. It is administered in a hospital or clinic by a specially trained health care professional. Talk to your pediatrician regarding the use of this medicine in children. Special care may be needed. Overdosage: If you think you have taken too much of this medicine contact a poison control center or emergency room at once. NOTE: This medicine is only for you. Do not share this medicine with others. What if I miss a dose? It is important not to miss a dose. Call your doctor or health care professional if you are unable to keep an appointment. What may interact with this medication? medicines for seizures medicines to increase blood counts like filgrastim, pegfilgrastim, sargramostim some antibiotics like amikacin, gentamicin, neomycin, streptomycin, tobramycin vaccines Talk to your doctor or  health care professional before taking any of these medicines: acetaminophen aspirin ibuprofen ketoprofen naproxen This list may not describe all possible interactions. Give your health care provider a list of all the medicines, herbs, non-prescription drugs, or dietary supplements you use. Also tell them if you smoke, drink alcohol, or use illegal drugs. Some items may interact with your medicine. What should I watch for while using this medication? Your condition will be monitored carefully while you are receiving this medicine. You will need important blood work done while you are taking this medicine. This drug may make you feel generally unwell. This is not uncommon, as chemotherapy can affect healthy cells as well as cancer cells. Report any side effects. Continue your course of treatment even though you feel ill unless your doctor tells you to stop. In some cases, you may be given additional medicines to help with side effects. Follow all directions for their use. Call your  doctor or health care professional for advice if you get a fever, chills or sore throat, or other symptoms of a cold or flu. Do not treat yourself. This drug decreases your body's ability to fight infections. Try to avoid being around people who are sick. This medicine may increase your risk to bruise or bleed. Call your doctor or health care professional if you notice any unusual bleeding. Be careful brushing and flossing your teeth or using a toothpick because you may get an infection or bleed more easily. If you have any dental work done, tell your dentist you are receiving this medicine. Avoid taking products that contain aspirin, acetaminophen, ibuprofen, naproxen, or ketoprofen unless instructed by your doctor. These medicines may hide a fever. Do not become pregnant while taking this medicine. Women should inform their doctor if they wish to become pregnant or think they might be pregnant. There is a potential for serious side effects to an unborn child. Talk to your health care professional or pharmacist for more information. Do not breast-feed an infant while taking this medicine. What side effects may I notice from receiving this medication? Side effects that you should report to your doctor or health care professional as soon as possible: allergic reactions like skin rash, itching or hives, swelling of the face, lips, or tongue signs of infection - fever or chills, cough, sore throat, pain or difficulty passing urine signs of decreased platelets or bleeding - bruising, pinpoint red spots on the skin, black, tarry stools, nosebleeds signs of decreased red blood cells - unusually weak or tired, fainting spells, lightheadedness breathing problems changes in hearing changes in vision chest pain high blood pressure low blood counts - This drug may decrease the number of white blood cells, red blood cells and platelets. You may be at increased risk for infections and bleeding. nausea and  vomiting pain, swelling, redness or irritation at the injection site pain, tingling, numbness in the hands or feet problems with balance, talking, walking trouble passing urine or change in the amount of urine Side effects that usually do not require medical attention (report to your doctor or health care professional if they continue or are bothersome): hair loss loss of appetite metallic taste in the mouth or changes in taste This list may not describe all possible side effects. Call your doctor for medical advice about side effects. You may report side effects to FDA at 1-800-FDA-1088. Where should I keep my medication? This drug is given in a hospital or clinic and will not be stored at home. NOTE: This  sheet is a summary. It may not cover all possible information. If you have questions about this medicine, talk to your doctor, pharmacist, or health care provider.  2022 Elsevier/Gold Standard (2008-03-06 00:00:00)

## 2021-11-09 NOTE — Progress Notes (Signed)
Patient here for pre treatment check. Her affect is flat today she expressed feeling hopeless at times but does not want to talk with anyone at this time.

## 2021-11-10 ENCOUNTER — Inpatient Hospital Stay: Payer: Medicaid Other

## 2021-11-10 VITALS — BP 127/94 | HR 76 | Temp 96.0°F

## 2021-11-10 DIAGNOSIS — C3411 Malignant neoplasm of upper lobe, right bronchus or lung: Secondary | ICD-10-CM | POA: Diagnosis not present

## 2021-11-10 MED ORDER — SODIUM CHLORIDE 0.9 % IV SOLN
100.0000 mg/m2 | Freq: Once | INTRAVENOUS | Status: AC
  Administered 2021-11-10: 180 mg via INTRAVENOUS
  Filled 2021-11-10: qty 9

## 2021-11-10 MED ORDER — SODIUM CHLORIDE 0.9 % IV SOLN
10.0000 mg | Freq: Once | INTRAVENOUS | Status: AC
  Administered 2021-11-10: 10 mg via INTRAVENOUS
  Filled 2021-11-10: qty 10

## 2021-11-10 MED ORDER — SODIUM CHLORIDE 0.9 % IV SOLN
Freq: Once | INTRAVENOUS | Status: AC
  Filled 2021-11-10: qty 250

## 2021-11-10 MED ORDER — HEPARIN SOD (PORK) LOCK FLUSH 100 UNIT/ML IV SOLN
INTRAVENOUS | Status: AC
  Filled 2021-11-10: qty 5

## 2021-11-11 ENCOUNTER — Inpatient Hospital Stay: Payer: Medicaid Other

## 2021-11-11 ENCOUNTER — Inpatient Hospital Stay: Payer: Medicaid Other | Attending: Radiation Oncology

## 2021-11-11 ENCOUNTER — Other Ambulatory Visit: Payer: Self-pay

## 2021-11-11 VITALS — BP 121/65 | HR 68 | Temp 96.0°F | Resp 20

## 2021-11-11 DIAGNOSIS — I700 Atherosclerosis of aorta: Secondary | ICD-10-CM | POA: Diagnosis not present

## 2021-11-11 DIAGNOSIS — Z5189 Encounter for other specified aftercare: Secondary | ICD-10-CM | POA: Diagnosis not present

## 2021-11-11 DIAGNOSIS — Z79899 Other long term (current) drug therapy: Secondary | ICD-10-CM | POA: Insufficient documentation

## 2021-11-11 DIAGNOSIS — Z809 Family history of malignant neoplasm, unspecified: Secondary | ICD-10-CM | POA: Diagnosis not present

## 2021-11-11 DIAGNOSIS — K5900 Constipation, unspecified: Secondary | ICD-10-CM | POA: Insufficient documentation

## 2021-11-11 DIAGNOSIS — E876 Hypokalemia: Secondary | ICD-10-CM | POA: Diagnosis not present

## 2021-11-11 DIAGNOSIS — I2510 Atherosclerotic heart disease of native coronary artery without angina pectoris: Secondary | ICD-10-CM | POA: Insufficient documentation

## 2021-11-11 DIAGNOSIS — C7951 Secondary malignant neoplasm of bone: Secondary | ICD-10-CM | POA: Diagnosis not present

## 2021-11-11 DIAGNOSIS — Z7984 Long term (current) use of oral hypoglycemic drugs: Secondary | ICD-10-CM | POA: Insufficient documentation

## 2021-11-11 DIAGNOSIS — Z5111 Encounter for antineoplastic chemotherapy: Secondary | ICD-10-CM | POA: Insufficient documentation

## 2021-11-11 DIAGNOSIS — C3411 Malignant neoplasm of upper lobe, right bronchus or lung: Secondary | ICD-10-CM | POA: Insufficient documentation

## 2021-11-11 DIAGNOSIS — F17210 Nicotine dependence, cigarettes, uncomplicated: Secondary | ICD-10-CM | POA: Insufficient documentation

## 2021-11-11 DIAGNOSIS — C787 Secondary malignant neoplasm of liver and intrahepatic bile duct: Secondary | ICD-10-CM | POA: Diagnosis not present

## 2021-11-11 MED ORDER — HEPARIN SOD (PORK) LOCK FLUSH 100 UNIT/ML IV SOLN
500.0000 [IU] | Freq: Once | INTRAVENOUS | Status: AC | PRN
  Filled 2021-11-11: qty 5

## 2021-11-11 MED ORDER — HEPARIN SOD (PORK) LOCK FLUSH 100 UNIT/ML IV SOLN
INTRAVENOUS | Status: AC
  Administered 2021-11-11: 500 [IU]
  Filled 2021-11-11: qty 5

## 2021-11-11 MED ORDER — SODIUM CHLORIDE 0.9 % IV SOLN
100.0000 mg/m2 | Freq: Once | INTRAVENOUS | Status: AC
  Administered 2021-11-11: 180 mg via INTRAVENOUS
  Filled 2021-11-11: qty 9

## 2021-11-11 MED ORDER — SODIUM CHLORIDE 0.9 % IV SOLN
10.0000 mg | Freq: Once | INTRAVENOUS | Status: AC
  Administered 2021-11-11: 10 mg via INTRAVENOUS
  Filled 2021-11-11: qty 10

## 2021-11-11 MED ORDER — SODIUM CHLORIDE 0.9% FLUSH
10.0000 mL | INTRAVENOUS | Status: DC | PRN
  Administered 2021-11-11: 10 mL
  Filled 2021-11-11: qty 10

## 2021-11-11 MED ORDER — SODIUM CHLORIDE 0.9 % IV SOLN
Freq: Once | INTRAVENOUS | Status: AC
  Filled 2021-11-11: qty 250

## 2021-11-11 NOTE — Progress Notes (Signed)
Nutrition Follow-up: ° °Patient with recurrent lung cancer.  Patient receiving chemotherapy and radiation.   ° °Met with patient during infusion.  Says that her appetite is the same and she is still eating mostly lunch and supper.  Drinks glucerna shakes about 1 time per day.  Denies nausea.  ° °Medications: reviewed ° °Labs: reviewed ° °Anthropometrics:  ° °Weight 154 lb 1.6 oz °157 lb 1/3 °153 lb on 12/14 °152 lb 12/5 °155 lb on 11/21 ° ° °NUTRITION DIAGNOSIS: Inadequate oral intake stable ° ° °INTERVENTION:  °Continue to encourage good nutrition including good sources of proteins and adequate calories to maintain weight °Coupons for glucerna given to patient °  ° °MONITORING, EVALUATION, GOAL: weight trends, intake ° ° °NEXT VISIT: Tuesday, Feb 21 during infusion ° ° B. , RD, LDN °Registered Dietitian °[phone] (mobile) ° ° °

## 2021-11-11 NOTE — Patient Instructions (Signed)
[facility] [place]   Discharge Instructions: Thank you for choosing [facility] to provide your oncology and hematology care.  If you have a lab appointment with the [facility], please go directly to the [facility] and check in at the registration area.   Wear comfortable clothing and clothing appropriate for easy access to any Portacath or PICC line.   We strive to give you quality time with your provider. You may need to reschedule your appointment if you arrive late (15 or more minutes).  Arriving late affects you and other patients whose appointments are after yours.  Also, if you miss three or more appointments without notifying the office, you may be dismissed from the clinic at the providers discretion.      For prescription refill requests, have your pharmacy contact our office and allow 72 hours for refills to be completed.    Today you received the following chemotherapy and/or immunotherapy agents: Etoposide.      To help prevent nausea and vomiting after your treatment, we encourage you to take your nausea medication as directed.  BELOW ARE SYMPTOMS THAT SHOULD BE REPORTED IMMEDIATELY: *FEVER GREATER THAN 100.4 F (38 C) OR HIGHER *CHILLS OR SWEATING *NAUSEA AND VOMITING THAT IS NOT CONTROLLED WITH YOUR NAUSEA MEDICATION *UNUSUAL SHORTNESS OF BREATH *UNUSUAL BRUISING OR BLEEDING *URINARY PROBLEMS (pain or burning when urinating, or frequent urination) *BOWEL PROBLEMS (unusual diarrhea, constipation, pain near the anus) TENDERNESS IN MOUTH AND THROAT WITH OR WITHOUT PRESENCE OF ULCERS (sore throat, sores in mouth, or a toothache) UNUSUAL RASH, SWELLING OR PAIN  UNUSUAL VAGINAL DISCHARGE OR ITCHING   Items with * indicate a potential emergency and should be followed up as soon as possible or go to the Emergency Department if any problems should occur.  Please show the CHEMOTHERAPY ALERT CARD or IMMUNOTHERAPY ALERT CARD at check-in  to the Emergency Department and triage nurse.  Should you have questions after your visit or need to cancel or reschedule your appointment, please contact [facility] [place]  Dept: [phone]  and follow the prompts.  Office hours are 8:00 a.m. to 4:30 p.m. Monday - Friday. Please note that voicemails left after 4:00 p.m. may not be returned until the following business day.  We are closed weekends and major holidays. You have access to a nurse at all times for urgent questions. Please call the main number to the clinic Dept: [phone] and follow the prompts.  For any non-urgent questions, you may also contact your provider using MyChart. We now offer e-Visits for anyone 50 and older to request care online for non-urgent symptoms. For details visit [email or web address].   Also download the MyChart app! Go to the app store, search "MyChart", open the app, select [facility], and log in with your MyChart username and password.  Due to Covid, a mask is required upon entering the hospital/clinic. If you do not have a mask, one will be given to you upon arrival. For doctor visits, patients may have 1 support person aged 55 or older with them. For treatment visits, patients cannot have anyone with them due to current Covid guidelines and our immunocompromised population.

## 2021-11-12 ENCOUNTER — Inpatient Hospital Stay: Payer: Medicaid Other

## 2021-11-12 DIAGNOSIS — Z5111 Encounter for antineoplastic chemotherapy: Secondary | ICD-10-CM | POA: Diagnosis not present

## 2021-11-12 DIAGNOSIS — C3411 Malignant neoplasm of upper lobe, right bronchus or lung: Secondary | ICD-10-CM

## 2021-11-12 MED ORDER — PEGFILGRASTIM-CBQV 6 MG/0.6ML ~~LOC~~ SOSY
6.0000 mg | PREFILLED_SYRINGE | Freq: Once | SUBCUTANEOUS | Status: AC
  Administered 2021-11-12: 6 mg via SUBCUTANEOUS
  Filled 2021-11-12: qty 0.6

## 2021-11-13 ENCOUNTER — Encounter: Payer: Self-pay | Admitting: *Deleted

## 2021-11-18 ENCOUNTER — Other Ambulatory Visit: Payer: Self-pay | Admitting: *Deleted

## 2021-11-18 MED ORDER — OXYCODONE HCL 10 MG PO TABS: 10.0000 mg | ORAL_TABLET | Freq: Four times a day (QID) | ORAL | 0 refills | Status: DC | PRN

## 2021-11-18 MED ORDER — MORPHINE SULFATE ER 15 MG PO TBCR
15.0000 mg | EXTENDED_RELEASE_TABLET | Freq: Three times a day (TID) | ORAL | 0 refills | Status: DC
Start: 2021-11-18 — End: 2021-12-21

## 2021-11-18 NOTE — Telephone Encounter (Signed)
Pt requesting refill of morphine and oxycodone to be sent into [facility]

## 2021-11-19 NOTE — Telephone Encounter (Signed)
error 

## 2021-11-20 ENCOUNTER — Inpatient Hospital Stay: Payer: Medicaid Other

## 2021-11-20 ENCOUNTER — Inpatient Hospital Stay: Payer: Medicaid Other | Admitting: Internal Medicine

## 2021-11-27 ENCOUNTER — Other Ambulatory Visit: Payer: Self-pay

## 2021-11-27 ENCOUNTER — Ambulatory Visit
Admission: RE | Admit: 2021-11-27 | Discharge: 2021-11-27 | Disposition: A | Discharge status: Home / Self Care | Payer: Medicaid Other | Source: Ambulatory Visit | Attending: Internal Medicine | Admitting: Internal Medicine

## 2021-11-27 DIAGNOSIS — C3411 Malignant neoplasm of upper lobe, right bronchus or lung: Secondary | ICD-10-CM | POA: Insufficient documentation

## 2021-11-27 MED ORDER — IOHEXOL 300 MG/ML  SOLN
100.0000 mL | Freq: Once | INTRAMUSCULAR | Status: AC | PRN
  Administered 2021-11-27: 100 mL via INTRAVENOUS

## 2021-11-30 ENCOUNTER — Encounter: Payer: Self-pay | Admitting: Internal Medicine

## 2021-11-30 ENCOUNTER — Inpatient Hospital Stay (HOSPITAL_BASED_OUTPATIENT_CLINIC_OR_DEPARTMENT_OTHER): Payer: Medicaid Other | Admitting: Internal Medicine

## 2021-11-30 ENCOUNTER — Inpatient Hospital Stay: Payer: Medicaid Other

## 2021-11-30 ENCOUNTER — Other Ambulatory Visit: Payer: Self-pay

## 2021-11-30 DIAGNOSIS — C3411 Malignant neoplasm of upper lobe, right bronchus or lung: Secondary | ICD-10-CM

## 2021-11-30 DIAGNOSIS — Z5111 Encounter for antineoplastic chemotherapy: Secondary | ICD-10-CM | POA: Diagnosis not present

## 2021-11-30 LAB — COMPREHENSIVE METABOLIC PANEL
ALT: 12 U/L (ref 0–44)
AST: 17 U/L (ref 15–41)
Albumin: 3.7 g/dL (ref 3.5–5.0)
Alkaline Phosphatase: 125 U/L (ref 38–126)
Anion gap: 7 (ref 5–15)
BUN: 18 mg/dL (ref 6–20)
CO2: 24 mmol/L (ref 22–32)
Calcium: 9.1 mg/dL (ref 8.9–10.3)
Chloride: 105 mmol/L (ref 98–111)
Creatinine, Ser: 0.69 mg/dL (ref 0.44–1.00)
GFR, Estimated: >60 mL/min (ref >60)
Glucose, Bld: 112 mg/dL — ABNORMAL HIGH (ref 70–99)
Potassium: 3.3 mmol/L — ABNORMAL LOW (ref 3.5–5.1)
Sodium: 136 mmol/L (ref 135–145)
Total Bilirubin: <0.1 mg/dL — ABNORMAL LOW (ref 0.3–1.2)
Total Protein: 7.1 g/dL (ref 6.5–8.1)

## 2021-11-30 LAB — CBC WITH DIFFERENTIAL/PLATELET
Abs Immature Granulocytes: 0.09 10*3/uL — ABNORMAL HIGH (ref 0.00–0.07)
Basophils Absolute: 0.1 10*3/uL (ref 0.0–0.1)
Basophils Relative: 1 %
Eosinophils Absolute: 0.1 10*3/uL (ref 0.0–0.5)
Eosinophils Relative: 0 %
HCT: 40.6 % (ref 36.0–46.0)
Hemoglobin: 13.5 g/dL (ref 12.0–15.0)
Immature Granulocytes: 1 %
Lymphocytes Relative: 23 %
Lymphs Abs: 3.7 10*3/uL (ref 0.7–4.0)
MCH: 29.6 pg (ref 26.0–34.0)
MCHC: 33.3 g/dL (ref 30.0–36.0)
MCV: 89 fL (ref 80.0–100.0)
Monocytes Absolute: 1.2 10*3/uL — ABNORMAL HIGH (ref 0.1–1.0)
Monocytes Relative: 8 %
Neutro Abs: 10.7 10*3/uL — ABNORMAL HIGH (ref 1.7–7.7)
Neutrophils Relative %: 67 %
Platelets: 461 10*3/uL — ABNORMAL HIGH (ref 150–400)
RBC: 4.56 MIL/uL (ref 3.87–5.11)
RDW: 17.7 % — ABNORMAL HIGH (ref 11.5–15.5)
WBC: 15.8 10*3/uL — ABNORMAL HIGH (ref 4.0–10.5)
nRBC: 0 % (ref 0.0–0.2)

## 2021-11-30 MED ORDER — HEPARIN SOD (PORK) LOCK FLUSH 100 UNIT/ML IV SOLN
INTRAVENOUS | Status: AC
  Administered 2021-11-30: 500 [IU]
  Filled 2021-11-30: qty 5

## 2021-11-30 MED ORDER — HEPARIN SOD (PORK) LOCK FLUSH 100 UNIT/ML IV SOLN
500.0000 [IU] | Freq: Once | INTRAVENOUS | Status: AC | PRN
  Filled 2021-11-30: qty 5

## 2021-11-30 MED ORDER — SODIUM CHLORIDE 0.9 % IV SOLN
540.5000 mg | Freq: Once | INTRAVENOUS | Status: AC
  Administered 2021-11-30: 540.5 mg via INTRAVENOUS
  Filled 2021-11-30: qty 54.05

## 2021-11-30 MED ORDER — SODIUM CHLORIDE 0.9 % IV SOLN
10.0000 mg | Freq: Once | INTRAVENOUS | Status: AC
  Administered 2021-11-30: 10 mg via INTRAVENOUS
  Filled 2021-11-30: qty 10

## 2021-11-30 MED ORDER — PALONOSETRON HCL INJECTION 0.25 MG/5ML
0.2500 mg | Freq: Once | INTRAVENOUS | Status: AC
  Administered 2021-11-30: 0.25 mg via INTRAVENOUS
  Filled 2021-11-30: qty 5

## 2021-11-30 MED ORDER — SODIUM CHLORIDE 0.9 % IV SOLN
Freq: Once | INTRAVENOUS | Status: AC
  Filled 2021-11-30: qty 250

## 2021-11-30 MED ORDER — SODIUM CHLORIDE 0.9 % IV SOLN
1200.0000 mg | Freq: Once | INTRAVENOUS | Status: AC
  Administered 2021-11-30: 1200 mg via INTRAVENOUS
  Filled 2021-11-30: qty 20

## 2021-11-30 MED ORDER — SODIUM CHLORIDE 0.9 % IV SOLN
100.0000 mg/m2 | Freq: Once | INTRAVENOUS | Status: AC
  Administered 2021-11-30: 180 mg via INTRAVENOUS
  Filled 2021-11-30: qty 9

## 2021-11-30 MED ORDER — POTASSIUM CHLORIDE CRYS ER 20 MEQ PO TBCR
20.0000 meq | EXTENDED_RELEASE_TABLET | Freq: Once | ORAL | 3 refills | Status: DC
  Filled 2021-11-30: qty 30, 30d supply, fill #0

## 2021-11-30 MED ORDER — SODIUM CHLORIDE 0.9 % IV SOLN
150.0000 mg | Freq: Once | INTRAVENOUS | Status: AC
  Administered 2021-11-30: 150 mg via INTRAVENOUS
  Filled 2021-11-30: qty 150

## 2021-11-30 NOTE — Patient Instructions (Signed)
[facility] CANCER CTR AT [place]  Discharge Instructions: Thank you for choosing [facility] to provide your oncology and hematology care.  If you have a lab appointment with the [facility], please go directly to the [facility] and check in at the registration area.  Wear comfortable clothing and clothing appropriate for easy access to any Portacath or PICC line.   We strive to give you quality time with your provider. You may need to reschedule your appointment if you arrive late (15 or more minutes).  Arriving late affects you and other patients whose appointments are after yours.  Also, if you miss three or more appointments without notifying the office, you may be dismissed from the clinic at the providers discretion.      For prescription refill requests, have your pharmacy contact our office and allow 72 hours for refills to be completed.    Today you received the following chemotherapy and/or immunotherapy agents Tecentriq, Etoposide and Carboplatin       To help prevent nausea and vomiting after your treatment, we encourage you to take your nausea medication as directed.  BELOW ARE SYMPTOMS THAT SHOULD BE REPORTED IMMEDIATELY: *FEVER GREATER THAN 100.4 F (38 C) OR HIGHER *CHILLS OR SWEATING *NAUSEA AND VOMITING THAT IS NOT CONTROLLED WITH YOUR NAUSEA MEDICATION *UNUSUAL SHORTNESS OF BREATH *UNUSUAL BRUISING OR BLEEDING *URINARY PROBLEMS (pain or burning when urinating, or frequent urination) *BOWEL PROBLEMS (unusual diarrhea, constipation, pain near the anus) TENDERNESS IN MOUTH AND THROAT WITH OR WITHOUT PRESENCE OF ULCERS (sore throat, sores in mouth, or a toothache) UNUSUAL RASH, SWELLING OR PAIN  UNUSUAL VAGINAL DISCHARGE OR ITCHING   Items with * indicate a potential emergency and should be followed up as soon as possible or go to the Emergency Department if any problems should occur.  Please show the CHEMOTHERAPY ALERT CARD or IMMUNOTHERAPY  ALERT CARD at check-in to the Emergency Department and triage nurse.  Should you have questions after your visit or need to cancel or reschedule your appointment, please contact [facility] CANCER [place] AT [place]  [phone] and follow the prompts.  Office hours are 8:00 a.m. to 4:30 p.m. Monday - Friday. Please note that voicemails left after 4:00 p.m. may not be returned until the following business day.  We are closed weekends and major holidays. You have access to a nurse at all times for urgent questions. Please call the main number to the clinic [phone] and follow the prompts.  For any non-urgent questions, you may also contact your provider using MyChart. We now offer e-Visits for anyone 57 and older to request care online for non-urgent symptoms. For details visit [email or web address].   Also download the MyChart app! Go to the app store, search "MyChart", open the app, select [facility], and log in with your MyChart username and password.  Due to Covid, a mask is required upon entering the hospital/clinic. If you do not have a mask, one will be given to you upon arrival. For doctor visits, patients may have 1 support person aged 69 or older with them. For treatment visits, patients cannot have anyone with them due to current Covid guidelines and our immunocompromised population.

## 2021-11-30 NOTE — Assessment & Plan Note (Addendum)
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 PET 08/28/2021-metastatic disease noted in the liver/left hepatic lobe; hypermetabolic gastrohepatic lymph node; and also left scapular metastases with destruction of the bone [see below]. Currently on [place] etoposide-Tecentriq.  S/p 3 cycles-stable liver lesion; improved metastatic abdominal lymphadenopathy; healing/sclerosis noted of the right scapular lesion.  No new disease.  # proceed with [place] etoposide-Tecentriq #4 today. Labs today reviewed;  acceptable for treatment today. Again reviewed the palliative nature of the chemotherapy indefinite-until toxicity/progression of disease.  Discussed starting with next cycle patient will just receive immunotherapy.  # mets to bone/ Pain right shoulder- currenrly s/p RT [ 09/16/2021]; continue with MS contin BID: and oxycodone 10 mg prn.  STABLE.    # Hypokalemia: K- 3.3; will send script for Kdur QD>   # constipation:  Miralax BID; fluids; Dulcolax- STABLE.     #Incidental findings on Imaging  CT FEB 2023-. There is also fusiform ectasia of the infrarenal abdominal aorta [2.7 x2.8cm; with mural thrombus]; left adrenal adenoma I reviewed/discussed/counseled the patient.   #IV access: functional  # DISPOSITION: # Chemo today; appt as as planned this week # follow up in 3 weeks- MD- port lab- cbc/cmp;TSH- tecentriq- ONLY-Dr.B  # I reviewed the blood work- with the patient in detail; also reviewed the imaging independently [as summarized above]; and with the patient in detail.

## 2021-11-30 NOTE — Progress Notes (Signed)
[facility] CONSULT NOTE  Patient Care Team: Cammie Sickle, MD as PCP - General (Internal Medicine) Telford Nab, RN as Oncology Nurse Navigator  CHIEF COMPLAINTS/PURPOSE OF CONSULTATION: lung cancer  #  Oncology History Overview Note  # NOV -[id number] [facility] CANCER SCREENING PROGRAM]-18 mm right upper lobe lung nodule; DEC 2021- s/p right upper lobectomy [Dr. Roxan Hockey; [place]]; STAGE: I [pT-18 mm; LN-12=0]; predominant large cell neuroendocrine; minor adenocarcinoma.  Declines adjuvant chemotherapy.  #Recurrent/stage IV cancer NOV 2022- PET scan-scapular lesion liver lesion gastrohepatic lymphadenopathy.  11/21- start RT to right scapular lesion  # DEC 3rd, 2022- CARBO=ETOP+TECEN; udenyca #1.   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  Visceral Pleura Invasion: Not identified  Direct Invasion of Adjacent Structures: No adjacent structures present  Lymphovascular Invasion: Not identified  Margins: All margins negative for invasive carcinoma       Closest Margin(s) to Invasive Carcinoma: Bronchovascular margin  Treatment Effect: No known presurgical therapy  Regional Lymph Nodes:       Number of Lymph Nodes Involved: 0                            Nodal Sites with Tumor: Not applicable       Number of Lymph Nodes Examined: 12      Primary cancer of right upper lobe of lung ([facility])  11/03/2020 Initial Diagnosis   Primary cancer of right upper lobe of lung ([facility])   11/03/2020 Cancer Staging   Staging form: Lung, AJCC 8th Edition - Pathologic: Stage IA3 (pT1c, pN0, cM0) - Signed by Cammie Sickle, MD on 11/04/2020     09/14/2021 -  Chemotherapy   Patient is on Treatment Plan : LUNG SCLC Carboplatin + Etoposide + Atezolizumab Induction q21d / Atezolizumab Maintenance q21d     11/09/2021 Cancer Staging   Staging form: Lung, AJCC 8th Edition - Pathologic: Stage IVB (pTX, pNX, cM1c) - Signed by Cammie Sickle, MD on 11/09/2021       HISTORY OF PRESENTING ILLNESS: Ambulating independently.  Alone.   Stacie Kennedy 61 y.o.  female with stage IV lung cancer/recurrent predominant large cell neuroendocrine cancer -currently on palliative chemoi-mmunotherapy here today proceed with treatment/review results of the CT scan.   Patient continues to note improvement of her right shoulder pain;  Patient states to be on MS Contin  one a day;  oxycodone up to 2 a day.  No nausea no vomiting.  No headaches. Chronic shortness of breath-not any worse.  Review of Systems  Constitutional:  Negative for chills, diaphoresis, fever, malaise/fatigue and weight loss.  HENT:  Negative for nosebleeds and sore throat.   Eyes:  Negative for double vision.  Respiratory:  Negative for cough, hemoptysis, sputum production, shortness of breath and wheezing.   Cardiovascular:  Negative for chest pain, palpitations, orthopnea and leg swelling.  Gastrointestinal:  Negative for abdominal pain, blood in stool, constipation, diarrhea, heartburn, melena, nausea and vomiting.  Genitourinary:  Negative for dysuria, frequency and urgency.  Musculoskeletal:  Positive for back pain, joint pain and myalgias.  Skin: Negative.  Negative for itching and  rash.  °Neurological:  Negative for dizziness, tingling, focal weakness, weakness and headaches.  °Endo/Heme/Allergies:  Does not bruise/bleed easily.  °Psychiatric/Behavioral:  Negative for depression. The patient is not nervous/anxious and does not have insomnia.    ° °MEDICAL HISTORY:  °Past Medical History:  °Diagnosis Date  ° Cancer (HCC)   ° lung cancer  ° Depression   ° Duodenitis   ° Dyspnea    ° Family history of cancer   ° High cholesterol   ° Personal history of colonic polyps   ° Pre-diabetes   ° Umbilical hernia   ° UTI (urinary tract infection)   ° ° °SURGICAL HISTORY: °Past Surgical History:  °Procedure Laterality Date  ° COLONOSCOPY WITH PROPOFOL N/A 03/24/2021  ° Procedure: COLONOSCOPY WITH PROPOFOL;  Surgeon: Anna, Kiran, MD;  Location: [facility] ENDOSCOPY;  Service: Gastroenterology;  Laterality: N/A;  ° INTERCOSTAL NERVE BLOCK  09/19/2020  ° Procedure: INTERCOSTAL NERVE BLOCK;  Surgeon: Hendrickson, Steven C, MD;  Location: [facility] OR;  Service: Thoracic;;  ° IR IMAGING GUIDED PORT INSERTION  09/23/2021  ° LUNG LOBECTOMY Right   ° LUNG REMOVAL, PARTIAL Right 09/19/2020  ° NODE DISSECTION Right 09/19/2020  ° Procedure: NODE DISSECTION;  Surgeon: Hendrickson, Steven C, MD;  Location: [facility] OR;  Service: Thoracic;  Laterality: Right;  ° VIDEO BRONCHOSCOPY N/A 07/08/2021  ° Procedure: VIDEO BRONCHOSCOPY;  Surgeon: Hendrickson, Steven C, MD;  Location: [facility] OR;  Service: Thoracic;  Laterality: N/A;  ° ° °SOCIAL HISTORY: °Social History  ° °Socioeconomic History  ° Marital status: Single  °  Spouse name: Not on file  ° Number of children: Not on file  ° Years of education: Not on file  ° Highest education level: Not on file  °Occupational History  ° Not on file  °Tobacco Use  ° Smoking status: Some Days  °  Packs/day: 0.25  °  Years: 43.00  °  Pack years: 10.75  °  Types: Cigarettes  ° Smokeless tobacco: Never  ° Tobacco comments:  °  states she is slowing, trying to quit  °Vaping Use  ° Vaping Use: Never used  °Substance and Sexual Activity  ° Alcohol use: Yes  °  Alcohol/week: 2.0 standard drinks  °  Types: 2 Cans of beer per week  °  Comment: occasional  ° Drug use: No  ° Sexual activity: Not on file  °Other Topics Concern  ° Not on file  °Social History Narrative  ° Lives in [place]; smokes; now and then beer; with boy friend. 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   ° °Social Determinants of  Health  ° °Financial Resource Strain: Not on file  °Food Insecurity: Food Insecurity Present  ° Worried About Running Out of Food in the Last Year: Sometimes true  ° Ran Out of Food in the Last Year: Sometimes true  °Transportation Needs: No Transportation Needs  ° Lack of Transportation (Medical): No  ° Lack of Transportation (Non-Medical): No  °Physical Activity: Insufficiently Active  ° Days of Exercise per Week: 7 days  ° Minutes of Exercise per Session: 20 min  °Stress: Stress Concern Present  ° Feeling of Stress : To some extent  °Social Connections: Socially Isolated  ° Frequency of Communication with Friends and Family: Never  ° Frequency of Social Gatherings with Friends and Family: Never  ° Attends Religious Services: Never  ° Active Member of Clubs or Organizations: No  ° Attends Club or Organization Meetings: Never  ° Marital Status: Never married  °  Intimate Partner Violence: Not on file  ° ° °FAMILY HISTORY: °Family History  °Problem Relation Age of Onset  ° Diabetes Mother   ° Cancer Mother   ° Diabetes Father   ° Stroke Father   ° Heart attack Father   °     70s  ° Healthy Sister   ° Cancer Brother   ° Hepatitis Brother   ° Diabetes Brother   ° Hypertension Brother   ° Heart disease Brother   ° ° °ALLERGIES:  has No Known Allergies. ° °MEDICATIONS:  °Current Outpatient Medications  °Medication Sig Dispense Refill  ° albuterol (VENTOLIN HFA) 108 (90 Base) MCG/ACT inhaler Inhale 2 puffs into the lungs every 6 (six) hours as needed for wheezing or shortness of breath. 6.7 g 2  ° aspirin EC 81 MG tablet Take 1 tablet (81 mg total) by mouth daily. Swallow whole. 30 tablet 11  ° lidocaine-prilocaine (EMLA) cream Apply one application topically the the affected area(s) daily as needed. 30 g 3  ° loratadine (CLARITIN) 10 MG tablet Take 1 tablet (10 mg total) by mouth daily. 30 tablet 2  ° metFORMIN (GLUCOPHAGE) 500 MG tablet TAKE ONE TABLET BY MOUTH EVERY MORNING WITH BREAKFAST 30 tablet 3  ° morphine (MS  CONTIN) 15 MG 12 hr tablet Take 1 tablet (15 mg total) by mouth every 8 (eight) hours. 90 tablet 0  ° Oxycodone HCl 10 MG TABS Take 1 tablet (10 mg total) by mouth every 6 (six) hours as needed (pain). 60 tablet 0  ° potassium chloride SA (KLOR-CON M) 20 MEQ tablet Take 1 tablet (20 mEq total) by mouth once daily. 30 tablet 3  ° buPROPion (WELLBUTRIN SR) 150 MG 12 hr tablet Take 1 tablet (150 mg total) by mouth 2 (two) times daily. (Patient not taking: Reported on 10/13/2021) 60 tablet 0  ° ondansetron (ZOFRAN) 4 MG tablet TAKE 2 TABLETS (8MG) BY MOUTH ONCE EVERY 8 HOURS AS NEEDED FOR NAUSEA OR VOMITING. (Patient not taking: Reported on 11/09/2021) 80 tablet 1  ° prochlorperazine (COMPAZINE) 10 MG tablet Take 1 tablet (10 mg total) by mouth once every 6 (six) hours as needed for nausea or vomiting. (Patient not taking: Reported on 11/09/2021) 40 tablet 1  ° rosuvastatin (CRESTOR) 10 MG tablet TAKE ONE TABLET BY MOUTH EVERY DAY (Patient not taking: Reported on 10/13/2021) 30 tablet 3  ° °No current facility-administered medications for this visit.  ° °Facility-Administered Medications Ordered in Other Visits  °Medication Dose Route Frequency Provider Last Rate Last Admin  ° heparin lock flush 100 UNIT/ML injection           ° heparin lock flush 100 unit/mL  500 Units Intracatheter Once PRN ,  R, MD      ° ° °  °. ° °PHYSICAL EXAMINATION: °ECOG PERFORMANCE STATUS: 0 - Asymptomatic ° °Vitals:  ° 11/30/21 0836  °BP: 106/71  °Pulse: 81  °Temp: 98.5 °F (36.9 °C)  °SpO2: 93%  ° ° ° ° °Filed Weights  ° 11/30/21 0836  °Weight: 154 lb (69.9 kg)  ° ° ° ° ° °Physical Exam °HENT:  °   Head: Normocephalic and atraumatic.  °   Mouth/Throat:  °   Pharynx: No oropharyngeal exudate.  °Eyes:  °   Pupils: Pupils are equal, round, and reactive to light.  °Cardiovascular:  °   Rate and Rhythm: Normal rate and regular rhythm.  °Pulmonary:  °   Effort: Pulmonary effort is normal. No respiratory distress.  °     Breath sounds:  Normal breath sounds. No wheezing.  °Abdominal:  °   General: Bowel sounds are normal. There is no distension.  °   Palpations: Abdomen is soft. There is no mass.  °   Tenderness: There is no abdominal tenderness. There is no guarding or rebound.  °Musculoskeletal:     °   General: No tenderness. Normal range of motion.  °   Cervical back: Normal range of motion and neck supple.  °Skin: °   General: Skin is warm.  °Neurological:  °   Mental Status: She is alert and oriented to person, place, and time.  °Psychiatric:     °   Mood and Affect: Affect normal.  ° ° ° °LABORATORY DATA:  °I have reviewed the data as listed °Lab Results  °Component Value Date  ° WBC 15.8 (H) 11/30/2021  ° HGB 13.5 11/30/2021  ° HCT 40.6 11/30/2021  ° MCV 89.0 11/30/2021  ° PLT 461 (H) 11/30/2021  ° °Recent Labs  °  10/13/21 °0824 11/09/21 °0838 11/30/21 °0809  °NA 137 137 136  °K 3.5 3.5 3.3*  °CL 103 103 105  °CO2 27 25 24  °GLUCOSE 141* 105* 112*  °BUN 12 11 18  °CREATININE 0.69 0.60 0.69  °CALCIUM 9.1 9.3 9.1  °GFRNONAA >60 >60 >60  °PROT 7.0 7.4 7.1  °ALBUMIN 3.4* 3.8 3.7  °AST 15 17 17  °ALT 9 10 12  °ALKPHOS 87 92 125  °BILITOT <0.1* 0.3 <0.1*  ° ° °RADIOGRAPHIC STUDIES: °I have personally reviewed the radiological images as listed and agreed with the findings in the report. °CT CHEST ABDOMEN PELVIS W CONTRAST ° °Result Date: 11/28/2021 °CLINICAL DATA:  60-year-old female with history of non-small cell lung cancer. Evaluate for response to therapy. EXAM: CT CHEST, ABDOMEN, AND PELVIS WITH CONTRAST TECHNIQUE: Multidetector CT imaging of the chest, abdomen and pelvis was performed following the standard protocol during bolus administration of intravenous contrast. RADIATION DOSE REDUCTION: This exam was performed according to the departmental dose-optimization program which includes automated exposure control, adjustment of the mA and/or kV according to patient size and/or use of iterative reconstruction technique. CONTRAST:  100mL  OMNIPAQUE IOHEXOL 300 MG/ML  SOLN COMPARISON:  PET-CT 08/27/2021. Chest CT 08/13/2021. Multiple other priors. FINDINGS: CT CHEST FINDINGS Cardiovascular: Heart size is normal. There is no significant pericardial fluid, thickening or pericardial calcification. There is aortic atherosclerosis, as well as atherosclerosis of the great vessels of the mediastinum and the coronary arteries, including calcified atherosclerotic plaque in the left main, left anterior descending, left circumflex and right coronary arteries. Right internal jugular single-lumen porta cath with tip terminating in the right atrium. Mediastinum/Nodes: No pathologically enlarged mediastinal or hilar lymph nodes. Esophagus is unremarkable in appearance. No axillary lymphadenopathy. Lungs/Pleura: Status post right upper lobectomy. Compensatory hyperexpansion of the right lower lobe. Extensive chronic scarring and volume loss in the right middle lobe again noted. No definite suspicious appearing pulmonary nodules or masses are noted. No acute consolidative airspace disease. No pleural effusions. Mild diffuse bronchial wall thickening with mild to moderate centrilobular and paraseptal emphysema. Musculoskeletal: Expansile mixed lytic and sclerotic lesion in the right scapula (axial image 11 of series 2 and sagittal image 17 of series 6) appears similar to the prior examination, with some new bone formation, which could suggest healing of the metastatic lesion, currently measuring approximately 4.1 x 2.8 x 6.1 cm. There are no other new aggressive appearing lytic or blastic lesions noted in the visualized portions of   the skeleton. CT ABDOMEN PELVIS FINDINGS Hepatobiliary: Heterogeneously enhancing lesions are noted in the left lobe of the liver measuring 5.4 x 4.5 cm centrally (axial image 41 of series 2) predominantly in segment 2, and 3.1 x 2.5 cm peripherally (axial image 44 of series 2) between segments 4A and 4B. No other new suspicious appearing  hepatic lesions are confidently identified. There is heterogeneous enhancement/perfusion of the liver, with extensive low attenuation predominantly throughout the right lobe of the liver, which may represent heterogeneous fatty infiltration. No intra or extrahepatic biliary ductal dilatation. Gallbladder is normal in appearance. Pancreas: No pancreatic mass. No pancreatic ductal dilatation. No pancreatic or peripancreatic fluid collections or inflammatory changes. Spleen: Unremarkable. Adrenals/Urinary Tract: 2.8 x 2.3 cm left adrenal nodule, previously characterized as a benign adenoma. Bilateral kidneys and right adrenal gland are otherwise normal in appearance. No hydroureteronephrosis. Urinary bladder is normal in appearance. Stomach/Bowel: The appearance of the stomach is normal. No pathologic dilatation of small bowel or colon. Normal appendix. Vascular/Lymphatic: Aortic atherosclerosis, with fusiform ectasia of the infrarenal abdominal aorta which measures up to 2.7 x 2.8 cm, where there is a large burden of atheromatous plaque and/or mural thrombus. No lymphadenopathy noted in the abdomen or pelvis. Previously noted hypermetabolic gastrohepatic ligament lymph node has decreased in size, currently measuring only 7 mm in short axis (axial image 49 of series 2). Reproductive: Uterus is heterogeneous in appearance with multiple densely calcified lesions, presumably multiple small fibroids. Ovaries are atrophic. Other: No significant volume of ascites.  No pneumoperitoneum. Musculoskeletal: There are no aggressive appearing lytic or blastic lesions noted in the visualized portions of the skeleton. IMPRESSION: 1. Metastatic disease to the liver again noted, similar to prior examinations allowing for differences in enhancement on today's examination. 2. Metastatic lymphadenopathy in the upper abdomen has regressed. 3. Some interval sclerosis of the known metastatic lesion in the right scapula, suggesting some bony  healing as a response to treatment. 4. No new sites of metastatic disease are noted elsewhere in the chest, abdomen or pelvis. 5. Aortic atherosclerosis, in addition to left main and three-vessel coronary artery disease. Please note that although the presence of coronary artery calcium documents the presence of coronary artery disease, the severity of this disease and any potential stenosis cannot be assessed on this non-gated CT examination. Assessment for potential risk factor modification, dietary therapy or pharmacologic therapy may be warranted, if clinically indicated. 6. There is also fusiform ectasia of the infrarenal abdominal aorta which measures up to 2.7 x 2.8 cm, with a large burden of atheromatous plaque and/or mural thrombus in this region. 7. 2.8 x 2.3 cm left adrenal adenoma. 8. Additional incidental findings, as above. Electronically Signed   By: Daniel  Entrikin M.D.   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 PET 08/28/2021-metastatic disease noted in the liver/left hepatic lobe; hypermetabolic gastrohepatic lymph node; and also left scapular metastases with destruction of the bone [see below]. Currently on carbo etoposide-Tecentriq.  S/p 3 cycles-stable liver lesion; improved metastatic abdominal lymphadenopathy; healing/sclerosis noted of the right scapular lesion.  No new disease. ° °# proceed with carbo etoposide-Tecentriq #4 today. Labs today reviewed;  acceptable for treatment today. Again reviewed the palliative nature of the chemotherapy indefinite-until toxicity/progression of disease.  Discussed starting with next cycle patient will just receive immunotherapy. ° °# mets to bone/ Pain right   shoulder- currenrly s/p RT [ 09/16/2021]; continue with MS contin BID: and oxycodone 10 mg prn.   °STABLE.   ° °# Hypokalemia: K- 3.3; will send script for Kdur QD>  ° °# constipation:  Miralax BID; fluids; Dulcolax- STABLE.    ° °#Incidental findings on Imaging  CT FEB 2023-. There is also fusiform ectasia of the infrarenal abdominal aorta [2.7 x2.8cm; with mural thrombus]; left adrenal adenoma I reviewed/discussed/counseled the patient.  ° °#IV access: functional ° °# DISPOSITION: °# Chemo today; appt as as planned this week °# follow up in 3 weeks- MD- port lab- cbc/cmp;TSH- tecentriq- ONLY-Dr.B ° °# I reviewed the blood work- with the patient in detail; also reviewed the imaging independently [as summarized above]; and with the patient in detail.  ° °  ° ° ° °All questions were answered. The patient knows to call the clinic with any problems, questions or concerns. °  °  R , MD °11/30/2021 12:53 PM ° ° ° ° °

## 2021-12-01 ENCOUNTER — Inpatient Hospital Stay: Payer: Medicaid Other

## 2021-12-01 VITALS — BP 100/54 | HR 65 | Temp 97.4°F | Resp 20

## 2021-12-01 DIAGNOSIS — Z5111 Encounter for antineoplastic chemotherapy: Secondary | ICD-10-CM | POA: Diagnosis not present

## 2021-12-01 DIAGNOSIS — C3411 Malignant neoplasm of upper lobe, right bronchus or lung: Secondary | ICD-10-CM

## 2021-12-01 MED ORDER — SODIUM CHLORIDE 0.9 % IV SOLN
10.0000 mg | Freq: Once | INTRAVENOUS | Status: AC
  Administered 2021-12-01: 10 mg via INTRAVENOUS
  Filled 2021-12-01: qty 10

## 2021-12-01 MED ORDER — SODIUM CHLORIDE 0.9 % IV SOLN
Freq: Once | INTRAVENOUS | Status: AC
  Filled 2021-12-01: qty 250

## 2021-12-01 MED ORDER — HEPARIN SOD (PORK) LOCK FLUSH 100 UNIT/ML IV SOLN
500.0000 [IU] | Freq: Once | INTRAVENOUS | Status: AC | PRN
  Administered 2021-12-01: 500 [IU]
  Filled 2021-12-01: qty 5

## 2021-12-01 MED ORDER — SODIUM CHLORIDE 0.9% FLUSH
10.0000 mL | INTRAVENOUS | Status: DC | PRN
  Administered 2021-12-01: 10 mL
  Filled 2021-12-01: qty 10

## 2021-12-01 MED ORDER — SODIUM CHLORIDE 0.9 % IV SOLN
100.0000 mg/m2 | Freq: Once | INTRAVENOUS | Status: AC
  Administered 2021-12-01: 180 mg via INTRAVENOUS
  Filled 2021-12-01: qty 9

## 2021-12-01 NOTE — Progress Notes (Signed)
Nutrition Follow-up:  Patient with recurrent lung cancer.  Patient receiving chemotherapy and immunotherapy.    Met with patient during infusion.  Patient reports that her appetite is about the same.  Eating 2 meals a day and drinking glucerna shakes when she has them available.  Denies any nutrition impact symptoms at this time.    Medications: reviewed  Labs: reviewed  Anthropometrics:   Weight 154 lb today  154 lb 1.6 oz on 2/1 157 lb on 1/3 153 lb on 12/14 152 lb on 12/5 155 lb on 11/21   NUTRITION DIAGNOSIS: Inadequate oral intake stable   INTERVENTION:  Coupons for glucerna given to patient Patient to continue high calories, high protein for weight maintenance.    MONITORING, EVALUATION, GOAL: weight trends, intake   NEXT VISIT: as needed  Stacie Kennedy Stacie Kennedy, [place], [place] Registered Dietitian [phone] (mobile)

## 2021-12-01 NOTE — Patient Instructions (Signed)
[facility] CANCER CTR AT [place]  Discharge Instructions: Thank you for choosing [facility] to provide your oncology and hematology care.  If you have a lab appointment with the [facility], please go directly to the [facility] and check in at the registration area.  Wear comfortable clothing and clothing appropriate for easy access to any Portacath or PICC line.   We strive to give you quality time with your provider. You may need to reschedule your appointment if you arrive late (15 or more minutes).  Arriving late affects you and other patients whose appointments are after yours.  Also, if you miss three or more appointments without notifying the office, you may be dismissed from the clinic at the providers discretion.      For prescription refill requests, have your pharmacy contact our office and allow 72 hours for refills to be completed.    Today you received the following chemotherapy and/or immunotherapy agents: Etoposide      To help prevent nausea and vomiting after your treatment, we encourage you to take your nausea medication as directed.  BELOW ARE SYMPTOMS THAT SHOULD BE REPORTED IMMEDIATELY: *FEVER GREATER THAN 100.4 F (38 C) OR HIGHER *CHILLS OR SWEATING *NAUSEA AND VOMITING THAT IS NOT CONTROLLED WITH YOUR NAUSEA MEDICATION *UNUSUAL SHORTNESS OF BREATH *UNUSUAL BRUISING OR BLEEDING *URINARY PROBLEMS (pain or burning when urinating, or frequent urination) *BOWEL PROBLEMS (unusual diarrhea, constipation, pain near the anus) TENDERNESS IN MOUTH AND THROAT WITH OR WITHOUT PRESENCE OF ULCERS (sore throat, sores in mouth, or a toothache) UNUSUAL RASH, SWELLING OR PAIN  UNUSUAL VAGINAL DISCHARGE OR ITCHING   Items with * indicate a potential emergency and should be followed up as soon as possible or go to the Emergency Department if any problems should occur.  Please show the CHEMOTHERAPY ALERT CARD or IMMUNOTHERAPY ALERT CARD at check-in to  the Emergency Department and triage nurse.  Should you have questions after your visit or need to cancel or reschedule your appointment, please contact [facility] CANCER [place] AT [place] [address]  [phone] and follow the prompts.  Office hours are 8:00 a.m. to 4:30 p.m. Monday - Friday. Please note that voicemails left after 4:00 p.m. may not be returned until the following business day.  We are closed weekends and major holidays. You have access to a nurse at all times for urgent questions. Please call the main number to the clinic [phone] and follow the prompts.  For any non-urgent questions, you may also contact your provider using MyChart. We now offer e-Visits for anyone 61 and older to request care online for non-urgent symptoms. For details visit [email or web address].   Also download the MyChart app! Go to the app store, search "MyChart", open the app, select [place], and log in with your MyChart username and password.  Due to Covid, a mask is required upon entering the hospital/clinic. If you do not have a mask, one will be given to you upon arrival. For doctor visits, patients may have 1 support person aged 61 or older with them. For treatment visits, patients cannot have anyone with them due to current Covid guidelines and our immunocompromised population. Etoposide, VP-16 injection What is this medication? ETOPOSIDE, VP-16 (e toe POE side) is a chemotherapy drug. It is used to treat testicular cancer, lung cancer, and other cancers. This medicine may be used for other purposes; ask your health care provider or pharmacist if you have questions. COMMON BRAND NAME(S): Etopophos, Toposar, VePesid What should I  tell my care team before I take this medication? They need to know if you have any of these conditions: infection kidney disease liver disease low blood counts, like low white cell, platelet, or red cell counts an unusual or allergic reaction to etoposide, other  medicines, foods, dyes, or preservatives pregnant or trying to get pregnant breast-feeding How should I use this medication? This medicine is for infusion into a vein. It is administered in a hospital or clinic by a specially trained health care professional. Talk to your pediatrician regarding the use of this medicine in children. Special care may be needed. Overdosage: If you think you have taken too much of this medicine contact a poison control center or emergency room at once. NOTE: This medicine is only for you. Do not share this medicine with others. What if I miss a dose? It is important not to miss your dose. Call your doctor or health care professional if you are unable to keep an appointment. What may interact with this medication? This medicine may interact with the following medications: warfarin This list may not describe all possible interactions. Give your health care provider a list of all the medicines, herbs, non-prescription drugs, or dietary supplements you use. Also tell them if you smoke, drink alcohol, or use illegal drugs. Some items may interact with your medicine. What should I watch for while using this medication? Visit your doctor for checks on your progress. This drug may make you feel generally unwell. This is not uncommon, as chemotherapy can affect healthy cells as well as cancer cells. Report any side effects. Continue your course of treatment even though you feel ill unless your doctor tells you to stop. In some cases, you may be given additional medicines to help with side effects. Follow all directions for their use. Call your doctor or health care professional for advice if you get a fever, chills or sore throat, or other symptoms of a cold or flu. Do not treat yourself. This drug decreases your body's ability to fight infections. Try to avoid being around people who are sick. This medicine may increase your risk to bruise or bleed. Call your doctor or health  care professional if you notice any unusual bleeding. Talk to your doctor about your risk of cancer. You may be more at risk for certain types of cancers if you take this medicine. Do not become pregnant while taking this medicine or for at least 6 months after stopping it. Women should inform their doctor if they wish to become pregnant or think they might be pregnant. Women of child-bearing potential will need to have a negative pregnancy test before starting this medicine. There is a potential for serious side effects to an unborn child. Talk to your health care professional or pharmacist for more information. Do not breast-feed an infant while taking this medicine. Men must use a latex condom during sexual contact with a woman while taking this medicine and for at least 4 months after stopping it. A latex condom is needed even if you have had a vasectomy. Contact your doctor right away if your partner becomes pregnant. Do not donate sperm while taking this medicine and for at least 4 months after you stop taking this medicine. Men should inform their doctors if they wish to father a child. This medicine may lower sperm counts. What side effects may I notice from receiving this medication? Side effects that you should report to your doctor or health care professional as soon  as possible: allergic reactions like skin rash, itching or hives, swelling of the face, lips, or tongue low blood counts - this medicine may decrease the number of white blood cells, red blood cells, and platelets. You may be at increased risk for infections and bleeding nausea, vomiting redness, blistering, peeling or loosening of the skin, including inside the mouth signs and symptoms of infection like fever; chills; cough; sore throat; pain or trouble passing urine signs and symptoms of low red blood cells or anemia such as unusually weak or tired; feeling faint or lightheaded; falls; breathing problems unusual bruising or  bleeding Side effects that usually do not require medical attention (report to your doctor or health care professional if they continue or are bothersome): changes in taste diarrhea hair loss loss of appetite mouth sores This list may not describe all possible side effects. Call your doctor for medical advice about side effects. You may report side effects to FDA at 1-800-FDA-1088. Where should I keep my medication? This drug is given in a hospital or clinic and will not be stored at home. NOTE: This sheet is a summary. It may not cover all possible information. If you have questions about this medicine, talk to your doctor, pharmacist, or health care provider.  2022 Elsevier/Gold Standard (2021-06-16 00:00:00)

## 2021-12-02 ENCOUNTER — Other Ambulatory Visit: Payer: Self-pay

## 2021-12-02 ENCOUNTER — Inpatient Hospital Stay: Payer: Medicaid Other

## 2021-12-02 VITALS — BP 119/67 | HR 56 | Temp 97.7°F | Resp 18

## 2021-12-02 DIAGNOSIS — Z5111 Encounter for antineoplastic chemotherapy: Secondary | ICD-10-CM | POA: Diagnosis not present

## 2021-12-02 DIAGNOSIS — C3411 Malignant neoplasm of upper lobe, right bronchus or lung: Secondary | ICD-10-CM

## 2021-12-02 MED ORDER — SODIUM CHLORIDE 0.9 % IV SOLN
10.0000 mg | Freq: Once | INTRAVENOUS | Status: AC
  Administered 2021-12-02: 10 mg via INTRAVENOUS
  Filled 2021-12-02: qty 10

## 2021-12-02 MED ORDER — HEPARIN SOD (PORK) LOCK FLUSH 100 UNIT/ML IV SOLN
500.0000 [IU] | Freq: Once | INTRAVENOUS | Status: AC | PRN
  Administered 2021-12-02: 500 [IU]
  Filled 2021-12-02: qty 5

## 2021-12-02 MED ORDER — SODIUM CHLORIDE 0.9 % IV SOLN
Freq: Once | INTRAVENOUS | Status: AC
  Filled 2021-12-02: qty 250

## 2021-12-02 MED ORDER — SODIUM CHLORIDE 0.9 % IV SOLN
100.0000 mg/m2 | Freq: Once | INTRAVENOUS | Status: AC
  Administered 2021-12-02: 180 mg via INTRAVENOUS
  Filled 2021-12-02: qty 9

## 2021-12-02 NOTE — Patient Instructions (Signed)
[facility] CANCER CTR AT [place]  Discharge Instructions: Thank you for choosing [facility] to provide your oncology and hematology care.  If you have a lab appointment with the [facility], please go directly to the [facility] and check in at the registration area.  Wear comfortable clothing and clothing appropriate for easy access to any Portacath or PICC line.   We strive to give you quality time with your provider. You may need to reschedule your appointment if you arrive late (15 or more minutes).  Arriving late affects you and other patients whose appointments are after yours.  Also, if you miss three or more appointments without notifying the office, you may be dismissed from the clinic at the providers discretion.      For prescription refill requests, have your pharmacy contact our office and allow 72 hours for refills to be completed.    Today you received the following chemotherapy and/or immunotherapy agents ETOPOSIDE      To help prevent nausea and vomiting after your treatment, we encourage you to take your nausea medication as directed.  BELOW ARE SYMPTOMS THAT SHOULD BE REPORTED IMMEDIATELY: *FEVER GREATER THAN 100.4 F (38 C) OR HIGHER *CHILLS OR SWEATING *NAUSEA AND VOMITING THAT IS NOT CONTROLLED WITH YOUR NAUSEA MEDICATION *UNUSUAL SHORTNESS OF BREATH *UNUSUAL BRUISING OR BLEEDING *URINARY PROBLEMS (pain or burning when urinating, or frequent urination) *BOWEL PROBLEMS (unusual diarrhea, constipation, pain near the anus) TENDERNESS IN MOUTH AND THROAT WITH OR WITHOUT PRESENCE OF ULCERS (sore throat, sores in mouth, or a toothache) UNUSUAL RASH, SWELLING OR PAIN  UNUSUAL VAGINAL DISCHARGE OR ITCHING   Items with * indicate a potential emergency and should be followed up as soon as possible or go to the Emergency Department if any problems should occur.  Please show the CHEMOTHERAPY ALERT CARD or IMMUNOTHERAPY ALERT CARD at check-in to  the Emergency Department and triage nurse.  Should you have questions after your visit or need to cancel or reschedule your appointment, please contact [facility] CANCER [place] AT [place]  [phone] and follow the prompts.  Office hours are 8:00 a.m. to 4:30 p.m. Monday - Friday. Please note that voicemails left after 4:00 p.m. may not be returned until the following business day.  We are closed weekends and major holidays. You have access to a nurse at all times for urgent questions. Please call the main number to the clinic [phone] and follow the prompts.  For any non-urgent questions, you may also contact your provider using MyChart. We now offer e-Visits for anyone 67 and older to request care online for non-urgent symptoms. For details visit [email or web address].   Also download the MyChart app! Go to the app store, search "MyChart", open the app, select [place], and log in with your MyChart username and password.  Due to Covid, a mask is required upon entering the hospital/clinic. If you do not have a mask, one will be given to you upon arrival. For doctor visits, patients may have 1 support person aged 38 or older with them. For treatment visits, patients cannot have anyone with them due to current Covid guidelines and our immunocompromised population.   Etoposide, VP-16 injection What is this medication? ETOPOSIDE, VP-16 (e toe POE side) is a chemotherapy drug. It is used to treat testicular cancer, lung cancer, and other cancers. This medicine may be used for other purposes; ask your health care provider or pharmacist if you have questions. COMMON BRAND NAME(S): Etopophos, Toposar, VePesid What  should I tell my care team before I take this medication? They need to know if you have any of these conditions: infection kidney disease liver disease low blood counts, like low white cell, platelet, or red cell counts an unusual or allergic reaction to etoposide, other  medicines, foods, dyes, or preservatives pregnant or trying to get pregnant breast-feeding How should I use this medication? This medicine is for infusion into a vein. It is administered in a hospital or clinic by a specially trained health care professional. Talk to your pediatrician regarding the use of this medicine in children. Special care may be needed. Overdosage: If you think you have taken too much of this medicine contact a poison control center or emergency room at once. NOTE: This medicine is only for you. Do not share this medicine with others. What if I miss a dose? It is important not to miss your dose. Call your doctor or health care professional if you are unable to keep an appointment. What may interact with this medication? This medicine may interact with the following medications: warfarin This list may not describe all possible interactions. Give your health care provider a list of all the medicines, herbs, non-prescription drugs, or dietary supplements you use. Also tell them if you smoke, drink alcohol, or use illegal drugs. Some items may interact with your medicine. What should I watch for while using this medication? Visit your doctor for checks on your progress. This drug may make you feel generally unwell. This is not uncommon, as chemotherapy can affect healthy cells as well as cancer cells. Report any side effects. Continue your course of treatment even though you feel ill unless your doctor tells you to stop. In some cases, you may be given additional medicines to help with side effects. Follow all directions for their use. Call your doctor or health care professional for advice if you get a fever, chills or sore throat, or other symptoms of a cold or flu. Do not treat yourself. This drug decreases your body's ability to fight infections. Try to avoid being around people who are sick. This medicine may increase your risk to bruise or bleed. Call your doctor or health  care professional if you notice any unusual bleeding. Talk to your doctor about your risk of cancer. You may be more at risk for certain types of cancers if you take this medicine. Do not become pregnant while taking this medicine or for at least 6 months after stopping it. Women should inform their doctor if they wish to become pregnant or think they might be pregnant. Women of child-bearing potential will need to have a negative pregnancy test before starting this medicine. There is a potential for serious side effects to an unborn child. Talk to your health care professional or pharmacist for more information. Do not breast-feed an infant while taking this medicine. Men must use a latex condom during sexual contact with a woman while taking this medicine and for at least 4 months after stopping it. A latex condom is needed even if you have had a vasectomy. Contact your doctor right away if your partner becomes pregnant. Do not donate sperm while taking this medicine and for at least 4 months after you stop taking this medicine. Men should inform their doctors if they wish to father a child. This medicine may lower sperm counts. What side effects may I notice from receiving this medication? Side effects that you should report to your doctor or health care professional  as soon as possible: allergic reactions like skin rash, itching or hives, swelling of the face, lips, or tongue low blood counts - this medicine may decrease the number of white blood cells, red blood cells, and platelets. You may be at increased risk for infections and bleeding nausea, vomiting redness, blistering, peeling or loosening of the skin, including inside the mouth signs and symptoms of infection like fever; chills; cough; sore throat; pain or trouble passing urine signs and symptoms of low red blood cells or anemia such as unusually weak or tired; feeling faint or lightheaded; falls; breathing problems unusual bruising or  bleeding Side effects that usually do not require medical attention (report to your doctor or health care professional if they continue or are bothersome): changes in taste diarrhea hair loss loss of appetite mouth sores This list may not describe all possible side effects. Call your doctor for medical advice about side effects. You may report side effects to FDA at 1-800-FDA-1088. Where should I keep my medication? This drug is given in a hospital or clinic and will not be stored at home. NOTE: This sheet is a summary. It may not cover all possible information. If you have questions about this medicine, talk to your doctor, pharmacist, or health care provider.  2022 Elsevier/Gold Standard (2021-06-16 00:00:00)

## 2021-12-03 ENCOUNTER — Telehealth: Payer: Self-pay | Admitting: Internal Medicine

## 2021-12-03 ENCOUNTER — Other Ambulatory Visit: Payer: Self-pay

## 2021-12-03 ENCOUNTER — Inpatient Hospital Stay: Payer: Medicaid Other

## 2021-12-03 ENCOUNTER — Encounter: Payer: Self-pay | Admitting: Internal Medicine

## 2021-12-03 DIAGNOSIS — C3411 Malignant neoplasm of upper lobe, right bronchus or lung: Secondary | ICD-10-CM

## 2021-12-03 DIAGNOSIS — Z5111 Encounter for antineoplastic chemotherapy: Secondary | ICD-10-CM | POA: Diagnosis not present

## 2021-12-03 MED ORDER — PEGFILGRASTIM-CBQV 6 MG/0.6ML ~~LOC~~ SOSY
6.0000 mg | PREFILLED_SYRINGE | Freq: Once | SUBCUTANEOUS | Status: AC
  Administered 2021-12-03: 6 mg via SUBCUTANEOUS
  Filled 2021-12-03: qty 0.6

## 2021-12-03 NOTE — Telephone Encounter (Signed)
Patient called at 736 am and got answering service. She needed to know the time of her Udenyca Inj today 12/03/21. I left her a VM on [phone] that her appointment is at 1 pm.

## 2021-12-17 ENCOUNTER — Other Ambulatory Visit: Payer: Self-pay

## 2021-12-21 ENCOUNTER — Inpatient Hospital Stay (HOSPITAL_BASED_OUTPATIENT_CLINIC_OR_DEPARTMENT_OTHER): Payer: Medicaid Other | Admitting: Internal Medicine

## 2021-12-21 ENCOUNTER — Inpatient Hospital Stay: Payer: Medicaid Other | Attending: Internal Medicine

## 2021-12-21 ENCOUNTER — Encounter: Payer: Self-pay | Admitting: Internal Medicine

## 2021-12-21 ENCOUNTER — Other Ambulatory Visit: Payer: Self-pay

## 2021-12-21 ENCOUNTER — Inpatient Hospital Stay: Payer: Medicaid Other

## 2021-12-21 DIAGNOSIS — Z8719 Personal history of other diseases of the digestive system: Secondary | ICD-10-CM | POA: Insufficient documentation

## 2021-12-21 DIAGNOSIS — Z7984 Long term (current) use of oral hypoglycemic drugs: Secondary | ICD-10-CM | POA: Insufficient documentation

## 2021-12-21 DIAGNOSIS — D3502 Benign neoplasm of left adrenal gland: Secondary | ICD-10-CM | POA: Diagnosis not present

## 2021-12-21 DIAGNOSIS — C787 Secondary malignant neoplasm of liver and intrahepatic bile duct: Secondary | ICD-10-CM | POA: Diagnosis not present

## 2021-12-21 DIAGNOSIS — Z87440 Personal history of urinary (tract) infections: Secondary | ICD-10-CM | POA: Insufficient documentation

## 2021-12-21 DIAGNOSIS — E876 Hypokalemia: Secondary | ICD-10-CM | POA: Diagnosis not present

## 2021-12-21 DIAGNOSIS — C3411 Malignant neoplasm of upper lobe, right bronchus or lung: Secondary | ICD-10-CM | POA: Diagnosis not present

## 2021-12-21 DIAGNOSIS — I2510 Atherosclerotic heart disease of native coronary artery without angina pectoris: Secondary | ICD-10-CM | POA: Insufficient documentation

## 2021-12-21 DIAGNOSIS — R7303 Prediabetes: Secondary | ICD-10-CM | POA: Diagnosis not present

## 2021-12-21 DIAGNOSIS — K5900 Constipation, unspecified: Secondary | ICD-10-CM | POA: Insufficient documentation

## 2021-12-21 DIAGNOSIS — C7951 Secondary malignant neoplasm of bone: Secondary | ICD-10-CM | POA: Insufficient documentation

## 2021-12-21 DIAGNOSIS — I700 Atherosclerosis of aorta: Secondary | ICD-10-CM | POA: Diagnosis not present

## 2021-12-21 DIAGNOSIS — J432 Centrilobular emphysema: Secondary | ICD-10-CM | POA: Diagnosis not present

## 2021-12-21 DIAGNOSIS — F17210 Nicotine dependence, cigarettes, uncomplicated: Secondary | ICD-10-CM | POA: Diagnosis not present

## 2021-12-21 DIAGNOSIS — E7800 Pure hypercholesterolemia, unspecified: Secondary | ICD-10-CM | POA: Diagnosis not present

## 2021-12-21 DIAGNOSIS — Z9221 Personal history of antineoplastic chemotherapy: Secondary | ICD-10-CM | POA: Diagnosis not present

## 2021-12-21 DIAGNOSIS — Z7982 Long term (current) use of aspirin: Secondary | ICD-10-CM | POA: Insufficient documentation

## 2021-12-21 DIAGNOSIS — Z79899 Other long term (current) drug therapy: Secondary | ICD-10-CM | POA: Insufficient documentation

## 2021-12-21 DIAGNOSIS — M25511 Pain in right shoulder: Secondary | ICD-10-CM | POA: Diagnosis not present

## 2021-12-21 LAB — CBC WITH DIFFERENTIAL/PLATELET
Abs Immature Granulocytes: 0.1 10*3/uL — ABNORMAL HIGH (ref 0.00–0.07)
Basophils Absolute: 0 10*3/uL (ref 0.0–0.1)
Basophils Relative: 0 %
Eosinophils Absolute: 0 10*3/uL (ref 0.0–0.5)
Eosinophils Relative: 0 %
HCT: 39.8 % (ref 36.0–46.0)
Hemoglobin: 13.1 g/dL (ref 12.0–15.0)
Immature Granulocytes: 1 %
Lymphocytes Relative: 23 %
Lymphs Abs: 3 10*3/uL (ref 0.7–4.0)
MCH: 29.7 pg (ref 26.0–34.0)
MCHC: 32.9 g/dL (ref 30.0–36.0)
MCV: 90.2 fL (ref 80.0–100.0)
Monocytes Absolute: 1.3 10*3/uL — ABNORMAL HIGH (ref 0.1–1.0)
Monocytes Relative: 10 %
Neutro Abs: 8.6 10*3/uL — ABNORMAL HIGH (ref 1.7–7.7)
Neutrophils Relative %: 66 %
Platelets: 385 10*3/uL (ref 150–400)
RBC: 4.41 MIL/uL (ref 3.87–5.11)
RDW: 20.1 % — ABNORMAL HIGH (ref 11.5–15.5)
WBC: 13.1 10*3/uL — ABNORMAL HIGH (ref 4.0–10.5)
nRBC: 0 % (ref 0.0–0.2)

## 2021-12-21 LAB — COMPREHENSIVE METABOLIC PANEL
ALT: 12 U/L (ref 0–44)
AST: 16 U/L (ref 15–41)
Albumin: 3.6 g/dL (ref 3.5–5.0)
Alkaline Phosphatase: 107 U/L (ref 38–126)
Anion gap: 5 (ref 5–15)
BUN: 14 mg/dL (ref 6–20)
CO2: 25 mmol/L (ref 22–32)
Calcium: 9.1 mg/dL (ref 8.9–10.3)
Chloride: 106 mmol/L (ref 98–111)
Creatinine, Ser: 0.58 mg/dL (ref 0.44–1.00)
GFR, Estimated: >60 mL/min (ref >60)
Glucose, Bld: 109 mg/dL — ABNORMAL HIGH (ref 70–99)
Potassium: 3.6 mmol/L (ref 3.5–5.1)
Sodium: 136 mmol/L (ref 135–145)
Total Bilirubin: 0.2 mg/dL — ABNORMAL LOW (ref 0.3–1.2)
Total Protein: 7.3 g/dL (ref 6.5–8.1)

## 2021-12-21 LAB — TSH: TSH: 1.335 u[IU]/mL (ref 0.350–4.500)

## 2021-12-21 MED ORDER — SODIUM CHLORIDE 0.9 % IV SOLN
1200.0000 mg | Freq: Once | INTRAVENOUS | Status: AC
  Administered 2021-12-21: 1200 mg via INTRAVENOUS
  Filled 2021-12-21: qty 20

## 2021-12-21 MED ORDER — SODIUM CHLORIDE 0.9 % IV SOLN
Freq: Once | INTRAVENOUS | Status: AC
  Filled 2021-12-21: qty 250

## 2021-12-21 MED ORDER — HEPARIN SOD (PORK) LOCK FLUSH 100 UNIT/ML IV SOLN
INTRAVENOUS | Status: AC
  Administered 2021-12-21: 500 [IU]
  Filled 2021-12-21: qty 5

## 2021-12-21 MED ORDER — SODIUM CHLORIDE 0.9% FLUSH
10.0000 mL | INTRAVENOUS | Status: DC | PRN
  Administered 2021-12-21: 10 mL
  Filled 2021-12-21: qty 10

## 2021-12-21 MED ORDER — HEPARIN SOD (PORK) LOCK FLUSH 100 UNIT/ML IV SOLN
500.0000 [IU] | Freq: Once | INTRAVENOUS | Status: AC | PRN
  Filled 2021-12-21: qty 5

## 2021-12-21 MED ORDER — OXYCODONE HCL 10 MG PO TABS: 10.0000 mg | ORAL_TABLET | Freq: Two times a day (BID) | ORAL | 0 refills | Status: DC | PRN

## 2021-12-21 MED ORDER — MORPHINE SULFATE ER 15 MG PO TBCR: 15.0000 mg | EXTENDED_RELEASE_TABLET | Freq: Two times a day (BID) | ORAL | 0 refills | Status: DC

## 2021-12-21 NOTE — Progress Notes (Signed)
Patient states she stopped taking the potassium medication because she believed it was making her legs hurt. Patient states after stopped taking the potassium her legs felt better. ?

## 2021-12-21 NOTE — Patient Instructions (Signed)
[facility] CANCER CTR AT [place]   ?Discharge Instructions: ?Thank you for choosing [facility] to provide your oncology and hematology care.  ?If you have a lab appointment with the [facility], please go directly to the [facility] and check in at the registration area. ?  ?Wear comfortable clothing and clothing appropriate for easy access to any Portacath or PICC line.  ? ?We strive to give you quality time with your provider. You may need to reschedule your appointment if you arrive late (15 or more minutes).  Arriving late affects you and other patients whose appointments are after yours.  Also, if you miss three or more appointments without notifying the office, you may be dismissed from the clinic at the provider?s discretion.    ?  ?For prescription refill requests, have your pharmacy contact our office and allow 72 hours for refills to be completed.   ? ?Today you received the following chemotherapy and/or immunotherapy agents: Tecentriq.    ?  ?To help prevent nausea and vomiting after your treatment, we encourage you to take your nausea medication as directed. ? ?BELOW ARE SYMPTOMS THAT SHOULD BE REPORTED IMMEDIATELY: ?*FEVER GREATER THAN 100.4 F (38 ?C) OR HIGHER ?*CHILLS OR SWEATING ?*NAUSEA AND VOMITING THAT IS NOT CONTROLLED WITH YOUR NAUSEA MEDICATION ?*UNUSUAL SHORTNESS OF BREATH ?*UNUSUAL BRUISING OR BLEEDING ?*URINARY PROBLEMS (pain or burning when urinating, or frequent urination) ?*BOWEL PROBLEMS (unusual diarrhea, constipation, pain near the anus) ?TENDERNESS IN MOUTH AND THROAT WITH OR WITHOUT PRESENCE OF ULCERS (sore throat, sores in mouth, or a toothache) ?UNUSUAL RASH, SWELLING OR PAIN  ?UNUSUAL VAGINAL DISCHARGE OR ITCHING  ? ?Items with * indicate a potential emergency and should be followed up as soon as possible or go to the Emergency Department if any problems should occur. ? ?Please show the CHEMOTHERAPY ALERT CARD or IMMUNOTHERAPY ALERT CARD at check-in  to the Emergency Department and triage nurse. ? ?Should you have questions after your visit or need to cancel or reschedule your appointment, please contact [facility] AT [place]  Dept: [phone]  and follow the prompts.  Office hours are 8:00 a.m. to 4:30 p.m. Monday - Friday. Please note that voicemails left after 4:00 p.m. may not be returned until the following business day.  We are closed weekends and major holidays. You have access to a nurse at all times for urgent questions. Please call the main number to the clinic Dept: [phone] and follow the prompts. ? ?For any non-urgent questions, you may also contact your provider using MyChart. We now offer e-Visits for anyone 83 and older to request care online for non-urgent symptoms. For details visit [email or web address]. ?  ?Also download the MyChart app! Go to the app store, search "MyChart", open the app, select , and log in with your MyChart username and password. ? ?Due to Covid, a mask is required upon entering the hospital/clinic. If you do not have a mask, one will be given to you upon arrival. For doctor visits, patients may have 1 support person aged 67 or older with them. For treatment visits, patients cannot have anyone with them due to current Covid guidelines and our immunocompromised population.  ?

## 2021-12-21 NOTE — Assessment & Plan Note (Addendum)
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 s/p carbo etoposide-Tecentriq. FEB 17th 2023- S/p 3 cycles-CT scan CAP- stable liver lesion; improved metastatic abdominal lymphadenopathy; healing/sclerosis noted of the right scapular lesion.  No new disease. ? ?# proceed with Tecentriq #1 maintenance today. Labs today reviewed;  acceptable for treatment today. Will repeat in 2  Months or so.  ? 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  STABLE.   ? ?# Hypokalemia: K- 3.6; STABLE.   ? ?# constipation:  Miralax BID; fluids; Dulcolax- STABLE.    ? ?#IV access: functional ? ?# DISPOSITION: ?# Tecentriq today;  ?# follow up in 4 weeks- MD- port lab- cbc/cmp; tecentriq- ONLY-Dr.B ?

## 2021-12-21 NOTE — Progress Notes (Signed)
[place] CONSULT NOTE  Patient Care Team: Cammie Sickle, MD as PCP - General (Internal Medicine) Telford Nab, RN as Oncology Nurse Navigator  CHIEF COMPLAINTS/PURPOSE OF CONSULTATION: lung cancer  #  Oncology History Overview Note  # NOV -[id number] [facility] CANCER SCREENING PROGRAM]-18 mm right upper lobe lung nodule; DEC 2021- s/p right upper lobectomy [Dr. Roxan Hockey; [place]]; STAGE: I [pT-18 mm; LN-12=0]; predominant large cell neuroendocrine; minor adenocarcinoma.  Declines adjuvant chemotherapy.  #Recurrent/stage IV cancer NOV 2022- PET scan-scapular lesion liver lesion gastrohepatic lymphadenopathy.  11/21- start RT to right scapular lesion  # DEC 3rd, 2022- CARBO=ETOP+TECEN; udenyca #1.   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  Visceral Pleura Invasion: Not identified  Direct Invasion of Adjacent Structures: No adjacent structures present  Lymphovascular Invasion: Not identified  Margins: All margins negative for invasive carcinoma       Closest Margin(s) to Invasive Carcinoma: Bronchovascular margin  Treatment Effect: No known presurgical therapy  Regional Lymph Nodes:       Number of Lymph Nodes Involved: 0                            Nodal Sites with Tumor: Not applicable       Number of Lymph Nodes Examined: 12      Primary cancer of right upper lobe of lung ([place])  11/03/2020 Initial Diagnosis   Primary cancer of right upper lobe of lung ([place])   11/03/2020 Cancer Staging   Staging form: Lung, AJCC 8th Edition - Pathologic: Stage IA3 (pT1c, pN0, cM0) - Signed by Cammie Sickle, MD on 11/04/2020     09/14/2021 -  Chemotherapy   Patient is on Treatment Plan : LUNG SCLC Carboplatin + Etoposide + Atezolizumab Induction q21d / Atezolizumab Maintenance q21d     11/09/2021 Cancer Staging   Staging form: Lung, AJCC 8th Edition - Pathologic: Stage IVB (pTX, pNX, cM1c) - Signed by Cammie Sickle, MD on 11/09/2021       HISTORY OF PRESENTING ILLNESS: Ambulating independently.  Alone.   Stacie Kennedy 61 y.o.  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  Patient continues to note improvement of her right shoulder pain;  Patient states to be on MS Contin  BID;  oxycodone up 1 to 2 a day.  No nausea no vomiting.  No headaches. Chronic shortness of breath-not any worse.  Review of Systems  Constitutional:  Negative for chills, diaphoresis, fever, malaise/fatigue and weight loss.  HENT:  Negative for nosebleeds and sore throat.   Eyes:  Negative for double vision.  Respiratory:  Negative for cough, hemoptysis, sputum production, shortness of breath and wheezing.   Cardiovascular:  Negative for chest pain, palpitations, orthopnea and leg swelling.  Gastrointestinal:  Negative for abdominal pain, blood in stool, constipation, diarrhea, heartburn, melena, nausea and vomiting.  Genitourinary:  Negative for dysuria, frequency and urgency.  Musculoskeletal:  Positive for back pain, joint pain and myalgias.  Skin: Negative.  Negative for itching and rash.  Neurological:  Negative for dizziness,  tingling, focal weakness, weakness and headaches.  Endo/Heme/Allergies:  Does not bruise/bleed easily.  Psychiatric/Behavioral:  Negative for depression. The patient is not nervous/anxious and does not have insomnia.     MEDICAL HISTORY:  Past Medical History:  Diagnosis Date   Cancer [facility])    lung cancer   Depression    Duodenitis    Dyspnea    Family history of cancer     High cholesterol    Personal history of colonic polyps    Pre-diabetes    Umbilical hernia    UTI (urinary tract infection)     SURGICAL HISTORY: Past Surgical History:  Procedure Laterality Date   COLONOSCOPY WITH PROPOFOL N/A 03/24/2021   Procedure: COLONOSCOPY WITH PROPOFOL;  Surgeon: Jonathon Bellows, MD;  Location: [facility] ENDOSCOPY;  Service: Gastroenterology;  Laterality: N/A;   INTERCOSTAL NERVE BLOCK  09/19/2020   Procedure: INTERCOSTAL NERVE BLOCK;  Surgeon: Melrose Nakayama, MD;  Location: [facility];  Service: Thoracic;;   IR IMAGING GUIDED PORT INSERTION  09/23/2021   LUNG LOBECTOMY Right    LUNG REMOVAL, PARTIAL Right 09/19/2020   NODE DISSECTION Right 09/19/2020   Procedure: NODE DISSECTION;  Surgeon: Melrose Nakayama, MD;  Location: [place];  Service: Thoracic;  Laterality: Right;   VIDEO BRONCHOSCOPY N/A 07/08/2021   Procedure: VIDEO BRONCHOSCOPY;  Surgeon: Melrose Nakayama, MD;  Location: [facility] OR;  Service: Thoracic;  Laterality: N/A;    SOCIAL HISTORY: Social History   Socioeconomic History   Marital status: Single    Spouse name: Not on file   Number of children: Not on file   Years of education: Not on file   Highest education level: Not on file  Occupational History   Not on file  Tobacco Use   Smoking status: Some Days    Packs/day: 0.25    Years: 43.00    Pack years: 10.75    Types: Cigarettes   Smokeless tobacco: Never   Tobacco comments:    states she is slowing, trying to quit  Vaping Use   Vaping Use: Never used  Substance and Sexual Activity   Alcohol use: Yes    Alcohol/week: 2.0 standard drinks    Types: 2 Cans of beer per week    Comment: occasional   Drug use: No   Sexual activity: Not on file  Other Topics Concern   Not on file  Social History Narrative   Lives in [place]; smokes; now and then beer; with boy friend. 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    Social Determinants of Health   Financial Resource  Strain: Not on file  Food Insecurity: Food Insecurity Present   Worried About [place] in the Last Year: Sometimes true   Ran Out of Food in the Last Year: Sometimes true  Transportation Needs: No Transportation Needs   Lack of Transportation (Medical): No   Lack of Transportation (Non-Medical): No  Physical Activity: Insufficiently Active   Days of Exercise per Week: 7 days   Minutes of Exercise per Session: 20 min  Stress: Stress Concern Present   Feeling of Stress : To some extent  Social Connections: Socially Isolated   Frequency of Communication with Friends and Family: Never   Frequency of Social Gatherings with Friends and Family: Never   Attends Religious Services: Never   Marine scientist or Organizations: No   Attends Archivist Meetings: Never   Marital Status: Never married  Intimate Partner Violence: Not on file  FAMILY HISTORY: Family History  Problem Relation Age of Onset   Diabetes Mother    Cancer Mother    Diabetes Father    Stroke Father    Heart attack Father        106s   Healthy Sister    Cancer Brother    Hepatitis Brother    Diabetes Brother    Hypertension Brother    Heart disease Brother     ALLERGIES:  has No Known Allergies.  MEDICATIONS:  Current Outpatient Medications  Medication Sig Dispense Refill   albuterol (VENTOLIN HFA) 108 (90 Base) MCG/ACT inhaler Inhale 2 puffs into the lungs every 6 (six) hours as needed for wheezing or shortness of breath. 6.7 g 2   lidocaine-prilocaine (EMLA) cream Apply one application topically the the affected area(s) daily as needed. 30 g 3   loratadine (CLARITIN) 10 MG tablet Take 1 tablet (10 mg total) by mouth daily. 30 tablet 2   metFORMIN (GLUCOPHAGE) 500 MG tablet TAKE ONE TABLET BY MOUTH EVERY MORNING WITH BREAKFAST 30 tablet 3   aspirin EC 81 MG tablet Take 1 tablet (81 mg total) by mouth daily. Swallow whole. (Patient not taking: Reported on 12/21/2021) 30 tablet 11    buPROPion (WELLBUTRIN SR) 150 MG 12 hr tablet Take 1 tablet (150 mg total) by mouth 2 (two) times daily. (Patient not taking: Reported on 10/13/2021) 60 tablet 0   morphine (MS CONTIN) 15 MG 12 hr tablet Take 1 tablet (15 mg total) by mouth every 12 (twelve) hours. 60 tablet 0   ondansetron (ZOFRAN) 4 MG tablet TAKE 2 TABLETS (8MG) BY MOUTH ONCE EVERY 8 HOURS AS NEEDED FOR NAUSEA OR VOMITING. (Patient not taking: Reported on 11/09/2021) 80 tablet 1   Oxycodone HCl 10 MG TABS Take 1 tablet (10 mg total) by mouth 2 (two) times daily as needed (pain). 60 tablet 0   potassium chloride SA (KLOR-CON M) 20 MEQ tablet Take 1 tablet (20 mEq total) by mouth once daily. (Patient not taking: Reported on 12/21/2021) 30 tablet 3   prochlorperazine (COMPAZINE) 10 MG tablet Take 1 tablet (10 mg total) by mouth once every 6 (six) hours as needed for nausea or vomiting. (Patient not taking: Reported on 11/09/2021) 40 tablet 1   rosuvastatin (CRESTOR) 10 MG tablet TAKE ONE TABLET BY MOUTH EVERY DAY (Patient not taking: Reported on 10/13/2021) 30 tablet 3   No current facility-administered medications for this visit.   Facility-Administered Medications Ordered in Other Visits  Medication Dose Route Frequency Provider Last Rate Last Admin   sodium chloride flush (NS) 0.9 % injection 10 mL  10 mL Intracatheter PRN Cammie Sickle, MD   10 mL at 12/21/21 0948      .  PHYSICAL EXAMINATION: ECOG PERFORMANCE STATUS: 0 - Asymptomatic  Vitals:   12/21/21 0902  BP: 125/71  Pulse: 72  Resp: 19  Temp: 98.7 F (37.1 C)  SpO2: 92%      Filed Weights   12/21/21 0902  Weight: 153 lb 6.4 oz (69.6 kg)       Physical Exam HENT:     Head: Normocephalic and atraumatic.     Mouth/Throat:     Pharynx: No oropharyngeal exudate.  Eyes:     Pupils: Pupils are equal, round, and reactive to light.  Cardiovascular:     Rate and Rhythm: Normal rate and regular rhythm.  Pulmonary:     Effort: Pulmonary effort is  normal. No respiratory distress.  Breath sounds: Normal breath sounds. No wheezing.  Abdominal:     General: Bowel sounds are normal. There is no distension.     Palpations: Abdomen is soft. There is no mass.     Tenderness: There is no abdominal tenderness. There is no guarding or rebound.  Musculoskeletal:        General: No tenderness. Normal range of motion.     Cervical back: Normal range of motion and neck supple.  Skin:    General: Skin is warm.  Neurological:     Mental Status: She is alert and oriented to person, place, and time.  Psychiatric:        Mood and Affect: Affect normal.     LABORATORY DATA:  I have reviewed the data as listed Lab Results  Component Value Date   WBC 13.1 (H) 12/21/2021   HGB 13.1 12/21/2021   HCT 39.8 12/21/2021   MCV 90.2 12/21/2021   PLT 385 12/21/2021   Recent Labs    11/09/21 0838 11/30/21 0809 12/21/21 0848  NA 137 136 136  K 3.5 3.3* 3.6  CL 103 105 106  CO2 _0 GLUCOSE 105* 112* 109*  BUN _1 CREATININE 0.60 0.69 0.58  CALCIUM 9.3 9.1 9.1  GFRNONAA >60 >60 >60  PROT 7.4 7.1 7.3  ALBUMIN 3.8 3.7 3.6  AST _2 ALT _3 ALKPHOS 92 125 107  BILITOT 0.3 <0.1* 0.2*    RADIOGRAPHIC STUDIES: I have personally reviewed the radiological images as listed and agreed with the findings in the report. CT CHEST ABDOMEN PELVIS W CONTRAST  Result Date: 11/28/2021 CLINICAL DATA:  61 year old female with history of non-small cell lung cancer. Evaluate for response to therapy. EXAM: CT CHEST, ABDOMEN, AND PELVIS WITH CONTRAST TECHNIQUE: Multidetector CT imaging of the chest, abdomen and pelvis was performed following the standard protocol during bolus administration of intravenous contrast. RADIATION DOSE REDUCTION: This exam was performed according to the departmental dose-optimization program which includes automated exposure control, adjustment of the mA and/or kV according to patient size and/or use of iterative  reconstruction technique. CONTRAST:  127m OMNIPAQUE IOHEXOL 300 MG/ML  SOLN COMPARISON:  PET-CT 08/27/2021. Chest CT 08/13/2021. Multiple other priors. FINDINGS: CT CHEST FINDINGS Cardiovascular: Heart size is normal. There is no significant pericardial fluid, thickening or pericardial calcification. There is aortic atherosclerosis, as well as atherosclerosis of the great vessels of the mediastinum and the coronary arteries, including calcified atherosclerotic plaque in the left main, left anterior descending, left circumflex and right coronary arteries. Right internal jugular single-lumen porta cath with tip terminating in the right atrium. Mediastinum/Nodes: No pathologically enlarged mediastinal or hilar lymph nodes. Esophagus is unremarkable in appearance. No axillary lymphadenopathy. Lungs/Pleura: Status post right upper lobectomy. Compensatory hyperexpansion of the right lower lobe. Extensive chronic scarring and volume loss in the right middle lobe again noted. No definite suspicious appearing pulmonary nodules or masses are noted. No acute consolidative airspace disease. No pleural effusions. Mild diffuse bronchial wall thickening with mild to moderate centrilobular and paraseptal emphysema. Musculoskeletal: Expansile mixed lytic and sclerotic lesion in the right scapula (axial image 11 of series 2 and sagittal image 17 of series 6) appears similar to the prior examination, with some new bone formation, which could suggest healing of the metastatic lesion, currently measuring approximately 4.1 x 2.8 x 6.1 cm. There are no other new aggressive appearing lytic or blastic lesions noted in the visualized portions of the skeleton.  CT ABDOMEN PELVIS FINDINGS Hepatobiliary: Heterogeneously enhancing lesions are noted in the left lobe of the liver measuring 5.4 x 4.5 cm centrally (axial image 41 of series 2) predominantly in segment 2, and 3.1 x 2.5 cm peripherally (axial image 44 of series 2) between segments 4A  and 4B. No other new suspicious appearing hepatic lesions are confidently identified. There is heterogeneous enhancement/perfusion of the liver, with extensive low attenuation predominantly throughout the right lobe of the liver, which may represent heterogeneous fatty infiltration. No intra or extrahepatic biliary ductal dilatation. Gallbladder is normal in appearance. Pancreas: No pancreatic mass. No pancreatic ductal dilatation. No pancreatic or peripancreatic fluid collections or inflammatory changes. Spleen: Unremarkable. Adrenals/Urinary Tract: 2.8 x 2.3 cm left adrenal nodule, previously characterized as a benign adenoma. Bilateral kidneys and right adrenal gland are otherwise normal in appearance. No hydroureteronephrosis. Urinary bladder is normal in appearance. Stomach/Bowel: The appearance of the stomach is normal. No pathologic dilatation of small bowel or colon. Normal appendix. Vascular/Lymphatic: Aortic atherosclerosis, with fusiform ectasia of the infrarenal abdominal aorta which measures up to 2.7 x 2.8 cm, where there is a large burden of atheromatous plaque and/or mural thrombus. No lymphadenopathy noted in the abdomen or pelvis. Previously noted hypermetabolic gastrohepatic ligament lymph node has decreased in size, currently measuring only 7 mm in short axis (axial image 49 of series 2). Reproductive: Uterus is heterogeneous in appearance with multiple densely calcified lesions, presumably multiple small fibroids. Ovaries are atrophic. Other: No significant volume of ascites.  No pneumoperitoneum. Musculoskeletal: There are no aggressive appearing lytic or blastic lesions noted in the visualized portions of the skeleton. IMPRESSION: 1. Metastatic disease to the liver again noted, similar to prior examinations allowing for differences in enhancement on today's examination. 2. Metastatic lymphadenopathy in the upper abdomen has regressed. 3. Some interval sclerosis of the known metastatic lesion  in the right scapula, suggesting some bony healing as a response to treatment. 4. No new sites of metastatic disease are noted elsewhere in the chest, abdomen or pelvis. 5. Aortic atherosclerosis, in addition to left main and three-vessel coronary artery disease. Please note that although the presence of coronary artery calcium documents the presence of coronary artery disease, the severity of this disease and any potential stenosis cannot be assessed on this non-gated CT examination. Assessment for potential risk factor modification, dietary therapy or pharmacologic therapy may be warranted, if clinically indicated. 6. There is also fusiform ectasia of the infrarenal abdominal aorta which measures up to 2.7 x 2.8 cm, with a large burden of atheromatous plaque and/or mural thrombus in this region. 7. 2.8 x 2.3 cm left adrenal adenoma. 8. Additional incidental findings, as above. Electronically Signed   By: Vinnie Langton M.D.   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 s/p carbo etoposide-Tecentriq. FEB 17th 2023- S/p 3 cycles-CT scan CAP- stable liver lesion; improved metastatic abdominal lymphadenopathy; healing/sclerosis noted of the right scapular lesion.  No new disease.  # proceed with Tecentriq #1 maintenance today. Labs today reviewed;  acceptable for treatment today. Will repeat in 2  Months or so.   # mets to bone/ Pain right shoulder- currenrly s/p RT [ 09/16/2021]; continue with MS contin BID: and oxycodone 10 mg prn [?1-2/day].refilled.  STABLE.    # Hypokalemia: K- 3.6; STABLE.    # constipation:  Miralax BID; fluids; Dulcolax-  STABLE.     #IV access: functional  # DISPOSITION: # Tecentriq today;  # follow up in 4 weeks- MD- port lab- cbc/cmp; tecentriq- ONLY-Dr.B    All questions  were answered. The patient knows to call the clinic with any problems, questions or concerns.    Cammie Sickle, MD 12/21/2021 11:02 AM

## 2021-12-23 ENCOUNTER — Other Ambulatory Visit: Payer: Self-pay | Admitting: *Deleted

## 2021-12-23 NOTE — Telephone Encounter (Signed)
Pt stated that her refill of morphine and oxycodone was sent to [facility] which has been closed. Pt requested that her refill be sent into the [facility] outpatient pharmacy in order to continue receiving assistance from charitable funds to cover medication cost.  ?

## 2021-12-24 ENCOUNTER — Other Ambulatory Visit: Payer: Self-pay

## 2021-12-24 ENCOUNTER — Encounter: Payer: Self-pay | Admitting: Internal Medicine

## 2021-12-24 MED ORDER — OXYCODONE HCL 10 MG PO TABS
10.0000 mg | ORAL_TABLET | Freq: Two times a day (BID) | ORAL | 0 refills | Status: DC | PRN
  Filled 2021-12-24: qty 60, 30d supply, fill #0

## 2021-12-24 MED ORDER — MORPHINE SULFATE ER 15 MG PO TBCR
15.0000 mg | EXTENDED_RELEASE_TABLET | Freq: Two times a day (BID) | ORAL | 0 refills | Status: DC
  Filled 2021-12-24: qty 60, 30d supply, fill #0

## 2022-01-01 ENCOUNTER — Other Ambulatory Visit: Payer: Self-pay

## 2022-01-18 ENCOUNTER — Other Ambulatory Visit: Payer: Self-pay

## 2022-01-18 ENCOUNTER — Encounter: Payer: Self-pay | Admitting: Internal Medicine

## 2022-01-18 ENCOUNTER — Inpatient Hospital Stay (HOSPITAL_BASED_OUTPATIENT_CLINIC_OR_DEPARTMENT_OTHER): Payer: Medicaid Other | Admitting: Internal Medicine

## 2022-01-18 ENCOUNTER — Telehealth: Payer: Self-pay | Admitting: Pharmacy Technician

## 2022-01-18 ENCOUNTER — Inpatient Hospital Stay: Payer: Medicaid Other | Attending: Radiation Oncology

## 2022-01-18 ENCOUNTER — Inpatient Hospital Stay: Payer: Medicaid Other

## 2022-01-18 DIAGNOSIS — C3411 Malignant neoplasm of upper lobe, right bronchus or lung: Secondary | ICD-10-CM

## 2022-01-18 DIAGNOSIS — Z7982 Long term (current) use of aspirin: Secondary | ICD-10-CM | POA: Insufficient documentation

## 2022-01-18 DIAGNOSIS — E119 Type 2 diabetes mellitus without complications: Secondary | ICD-10-CM | POA: Insufficient documentation

## 2022-01-18 DIAGNOSIS — R7303 Prediabetes: Secondary | ICD-10-CM

## 2022-01-18 DIAGNOSIS — E876 Hypokalemia: Secondary | ICD-10-CM | POA: Insufficient documentation

## 2022-01-18 DIAGNOSIS — R0602 Shortness of breath: Secondary | ICD-10-CM | POA: Diagnosis not present

## 2022-01-18 DIAGNOSIS — Z809 Family history of malignant neoplasm, unspecified: Secondary | ICD-10-CM | POA: Diagnosis not present

## 2022-01-18 DIAGNOSIS — Z7984 Long term (current) use of oral hypoglycemic drugs: Secondary | ICD-10-CM | POA: Diagnosis not present

## 2022-01-18 DIAGNOSIS — Z5112 Encounter for antineoplastic immunotherapy: Secondary | ICD-10-CM | POA: Insufficient documentation

## 2022-01-18 DIAGNOSIS — C7951 Secondary malignant neoplasm of bone: Secondary | ICD-10-CM | POA: Insufficient documentation

## 2022-01-18 DIAGNOSIS — J449 Chronic obstructive pulmonary disease, unspecified: Secondary | ICD-10-CM | POA: Diagnosis not present

## 2022-01-18 DIAGNOSIS — K5900 Constipation, unspecified: Secondary | ICD-10-CM | POA: Diagnosis not present

## 2022-01-18 DIAGNOSIS — F17210 Nicotine dependence, cigarettes, uncomplicated: Secondary | ICD-10-CM | POA: Diagnosis not present

## 2022-01-18 LAB — CBC WITH DIFFERENTIAL/PLATELET
Abs Immature Granulocytes: 0 10*3/uL (ref 0.00–0.07)
Basophils Absolute: 0 10*3/uL (ref 0.0–0.1)
Basophils Relative: 1 %
Eosinophils Absolute: 0.1 10*3/uL (ref 0.0–0.5)
Eosinophils Relative: 2 %
HCT: 41.4 % (ref 36.0–46.0)
Hemoglobin: 13.8 g/dL (ref 12.0–15.0)
Immature Granulocytes: 0 %
Lymphocytes Relative: 38 %
Lymphs Abs: 2.3 10*3/uL (ref 0.7–4.0)
MCH: 30.5 pg (ref 26.0–34.0)
MCHC: 33.3 g/dL (ref 30.0–36.0)
MCV: 91.6 fL (ref 80.0–100.0)
Monocytes Absolute: 0.6 10*3/uL (ref 0.1–1.0)
Monocytes Relative: 9 %
Neutro Abs: 3 10*3/uL (ref 1.7–7.7)
Neutrophils Relative %: 50 %
Platelets: 258 10*3/uL (ref 150–400)
RBC: 4.52 MIL/uL (ref 3.87–5.11)
RDW: 18.4 % — ABNORMAL HIGH (ref 11.5–15.5)
WBC: 6 10*3/uL (ref 4.0–10.5)
nRBC: 0 % (ref 0.0–0.2)

## 2022-01-18 LAB — COMPREHENSIVE METABOLIC PANEL
ALT: 10 U/L (ref 0–44)
AST: 15 U/L (ref 15–41)
Albumin: 3.7 g/dL (ref 3.5–5.0)
Alkaline Phosphatase: 81 U/L (ref 38–126)
Anion gap: 6 (ref 5–15)
BUN: 11 mg/dL (ref 6–20)
CO2: 23 mmol/L (ref 22–32)
Calcium: 8.9 mg/dL (ref 8.9–10.3)
Chloride: 107 mmol/L (ref 98–111)
Creatinine, Ser: 0.65 mg/dL (ref 0.44–1.00)
GFR, Estimated: >60 mL/min (ref >60)
Glucose, Bld: 119 mg/dL — ABNORMAL HIGH (ref 70–99)
Potassium: 3.2 mmol/L — ABNORMAL LOW (ref 3.5–5.1)
Sodium: 136 mmol/L (ref 135–145)
Total Bilirubin: 0.5 mg/dL (ref 0.3–1.2)
Total Protein: 6.8 g/dL (ref 6.5–8.1)

## 2022-01-18 MED ORDER — SODIUM CHLORIDE 0.9 % IV SOLN
Freq: Once | INTRAVENOUS | Status: AC
  Filled 2022-01-18: qty 250

## 2022-01-18 MED ORDER — ALBUTEROL SULFATE HFA 108 (90 BASE) MCG/ACT IN AERS
2.0000 | INHALATION_SPRAY | Freq: Four times a day (QID) | RESPIRATORY_TRACT | 2 refills | Status: DC | PRN
  Filled 2022-01-18: qty 6.7, 25d supply, fill #0
  Filled 2022-03-24: qty 18, 25d supply, fill #0

## 2022-01-18 MED ORDER — HEPARIN SOD (PORK) LOCK FLUSH 100 UNIT/ML IV SOLN
500.0000 [IU] | Freq: Once | INTRAVENOUS | Status: AC
  Administered 2022-01-18: 500 [IU] via INTRAVENOUS
  Filled 2022-01-18: qty 5

## 2022-01-18 MED ORDER — SODIUM CHLORIDE 0.9 % IV SOLN
1200.0000 mg | Freq: Once | INTRAVENOUS | Status: AC
  Administered 2022-01-18: 1200 mg via INTRAVENOUS
  Filled 2022-01-18: qty 20

## 2022-01-18 MED ORDER — SODIUM CHLORIDE 0.9% FLUSH
10.0000 mL | Freq: Once | INTRAVENOUS | Status: AC
  Administered 2022-01-18: 10 mL via INTRAVENOUS
  Filled 2022-01-18: qty 10

## 2022-01-18 MED ORDER — METFORMIN HCL 500 MG PO TABS
ORAL_TABLET | Freq: Every day | ORAL | 3 refills | Status: DC
  Filled 2022-01-18: qty 90, fill #0
  Filled 2022-03-24: qty 90, 90d supply, fill #0

## 2022-01-18 NOTE — Progress Notes (Signed)
Patient denies new problems/concerns today.   °

## 2022-01-18 NOTE — Assessment & Plan Note (Addendum)
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 s/p carbo etoposide-Tecentriq. FEB 17th 2023- S/p 3 cycles-CT scan CAP- stable liver lesion; improved metastatic abdominal lymphadenopathy; healing/sclerosis noted of the right scapular lesion.  No new disease. ? ?# proceed with Tecentriq #2 maintenance today. Labs today reviewed;  acceptable for treatment today. Will order scan at next visit.  ? ?# mets to bone/ Pain right shoulder- currently s/p RT [ 09/16/2021]; continue with MS contin BID: and oxycodone 10 mg prn [?1-2/day]. STABLE. Added zometa;/discuss.     ? ?# Hypokalemia: K- 3.2; discussed re: dietary supp-STABLE.   ? ?# constipation:  Miralax BID; fluids; Dulcolax- STABLE.    ? ?# DM: stable' refilled Metformin.  ? ?# COPD:STABLE; refilled albuterol; ? ?#IV access: functional ? ?# DISPOSITION: ?# Tecentriq today;  ?# follow up in 3 weeks- MD- port lab- cbc/cmp; tecentriq--Dr.B ?

## 2022-01-18 NOTE — Progress Notes (Signed)
[place] ?CONSULT NOTE ? ?Patient Care Team: ?Cammie Sickle, MD as PCP - General (Internal Medicine) ?Telford Nab, RN as Sales executive ? ?CHIEF COMPLAINTS/PURPOSE OF CONSULTATION: lung cancer ? ?#  ?Oncology History Overview Note  ?# NOV -[id number] [facility] CANCER SCREENING PROGRAM]-18 mm right upper lobe lung nodule; DEC 2021- s/p right upper lobectomy [Dr. Roxan Hockey; [place]]; STAGE: I [pT-18 mm; LN-12=0]; predominant large cell neuroendocrine; minor adenocarcinoma.  Declines adjuvant chemotherapy. ? ?#Recurrent/stage IV cancer NOV 2022- PET scan-scapular lesion liver lesion gastrohepatic lymphadenopathy.  11/21- start RT to right scapular lesion ? ?# DEC 3rd, 2022- CARBO=ETOP+TECEN; udenyca #1.  ? ?NGS: Negative for any targetable mutation; PD-L1 0; KEPASAKE* ? ?# SURVIVORSHIP:  ? ?# GENETICS:  ? ? ? ? ? 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     Closest Margin(s) to Invasive Carcinoma: Bronchovascular margin  ?Treatment Effect: No known presurgical therapy  ?Regional Lymph Nodes:  ?     Number of Lymph Nodes Involved: 0  ?                          Nodal Sites with Tumor: Not applicable  ?     Number of Lymph Nodes Examined: 12  ? ? ?  ?Primary cancer of right upper lobe of lung ([place])  ?11/03/2020 Initial Diagnosis  ? Primary cancer of right upper lobe of lung [facility]) ?  ?11/03/2020 Cancer Staging  ? Staging form: Lung, AJCC 8th Edition ?- Pathologic: Stage IA3 (pT1c, pN0, cM0) - Signed by Cammie Sickle, MD on 11/04/2020 ? ?   ?09/14/2021 -  Chemotherapy  ? Patient is on Treatment Plan : LUNG SCLC Carboplatin + Etoposide + Atezolizumab Induction q21d / Atezolizumab Maintenance q21d  ?   ?11/09/2021 Cancer Staging  ? Staging form: Lung, AJCC 8th Edition ?- Pathologic: Stage IVB (pTX, pNX, cM1c) - Signed by Cammie Sickle, MD on 11/09/2021 ? ?  ? ? ? ?HISTORY OF PRESENTING ILLNESS: Ambulating independently.  Alone.  ? ?Stacie Kennedy 61 y.o.  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  ? ?Patient continues to note improvement of her right shoulder pain;  Patient states to be on MS Contin  BID;  oxycodone up 1 to 2 a day. ? ?No nausea no vomiting.  No headaches. Chronic shortness of breath-not any worse.  Requests a refill on her albuterol; also request refill on her metformin. ? ?Review of Systems  ?Constitutional:  Negative for chills, diaphoresis, fever, malaise/fatigue and weight loss.  ?HENT:  Negative for nosebleeds and sore throat.   ?Eyes:  Negative for double vision.  ?Respiratory:  Negative for cough, hemoptysis, sputum production, shortness of breath and wheezing.   ?Cardiovascular:  Negative for chest pain, palpitations, orthopnea and leg swelling.  ?Gastrointestinal:  Negative for abdominal pain, blood in stool, constipation, diarrhea, heartburn, melena, nausea and vomiting.  ?Genitourinary:  Negative for dysuria, frequency and urgency.  ?Musculoskeletal:  Positive for back pain, joint pain and myalgias.  ?Skin:  Negative.  Negative for itching and rash.  ?Neurological:  Negative for dizziness, tingling, focal weakness, weakness and headaches.  ?Endo/Heme/Allergies:  Does not bruise/bleed easily.  ?Psychiatric/Behavioral:  Negative for depression. The patient is not nervous/anxious and does not have insomnia.    ? ?MEDICAL HISTORY:  ?Past Medical History:  ?Diagnosis Date  ? Cancer [facility])   ? lung cancer  ? Depression   ?  Duodenitis   ? Dyspnea   ? Family history of cancer   ? High cholesterol   ? Personal history of colonic polyps   ? Pre-diabetes   ? Umbilical hernia   ? UTI (urinary tract infection)   ? ? ?SURGICAL HISTORY: ?Past Surgical History:  ?Procedure Laterality Date  ? COLONOSCOPY WITH PROPOFOL N/A 03/24/2021  ? Procedure: COLONOSCOPY WITH PROPOFOL;  Surgeon: Jonathon Bellows, MD;  Location: [facility] ENDOSCOPY;  Service: Gastroenterology;  Laterality: N/A;  ? INTERCOSTAL NERVE BLOCK  09/19/2020  ? Procedure: INTERCOSTAL NERVE BLOCK;  Surgeon: Melrose Nakayama, MD;  Location: [place];  Service: Thoracic;;  ? IR IMAGING GUIDED PORT INSERTION  09/23/2021  ? LUNG LOBECTOMY Right   ? LUNG REMOVAL, PARTIAL Right 09/19/2020  ? NODE DISSECTION Right 09/19/2020  ? Procedure: NODE DISSECTION;  Surgeon: Melrose Nakayama, MD;  Location: [place];  Service: Thoracic;  Laterality: Right;  ? VIDEO BRONCHOSCOPY N/A 07/08/2021  ? Procedure: VIDEO BRONCHOSCOPY;  Surgeon: Melrose Nakayama, MD;  Location: [place];  Service: Thoracic;  Laterality: N/A;  ? ? ?SOCIAL HISTORY: ?Social History  ? ?Socioeconomic History  ? Marital status: Single  ?  Spouse name: Not on file  ? Number of children: Not on file  ? Years of education: Not on file  ? Highest education level: Not on file  ?Occupational History  ? Not on file  ?Tobacco Use  ? Smoking status: Some Days  ?  Packs/day: 0.25  ?  Years: 43.00  ?  Pack years: 10.75  ?  Types: Cigarettes  ? Smokeless tobacco: Never  ? Tobacco comments:  ?  states she is slowing, trying to quit  ?Vaping Use  ? Vaping Use: Never used  ?Substance and Sexual Activity  ? Alcohol use: Yes  ?  Alcohol/week: 2.0 standard drinks  ?  Types: 2 Cans of beer per week  ?  Comment: occasional  ? Drug use: No  ? Sexual activity: Not on file  ?Other Topics Concern  ? Not on file  ?Social History Narrative  ? Lives in [place]; smokes; now and then beer; with boy friend. 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    ? ?Social Determinants of Health  ? ?Financial Resource Strain: Not on file  ?Food Insecurity: Food Insecurity Present  ? Worried About Charity fundraiser in the Last Year: Sometimes true  ? Ran Out of Food in the Last Year: Sometimes true  ?Transportation Needs: No Transportation Needs  ? Lack of Transportation (Medical): No  ? Lack of Transportation (Non-Medical): No  ?Physical Activity: Insufficiently Active  ? Days of Exercise per Week: 7 days  ? Minutes of Exercise per Session: 20 min  ?Stress: Stress Concern Present  ? Feeling of Stress : To some extent  ?Social Connections: Socially Isolated  ? Frequency of Communication with Friends and Family: Never  ? Frequency of Social Gatherings with Friends and Family: Never  ? Attends Religious Services: Never  ? Active Member of Clubs or Organizations: No  ? Attends Archivist Meetings: Never  ?  Marital Status: Never married  ?Intimate Partner Violence: Not on file  ? ? ?FAMILY HISTORY: ?Family History  ?Problem Relation Age of Onset  ? Diabetes Mother   ? Cancer Mother   ? Diabetes Father   ? Stroke Father   ? Heart attack Father   ?     54s  ? Healthy Sister   ? Cancer Brother   ? Hepatitis Brother   ? Diabetes Brother   ? Hypertension Brother   ? Heart disease Brother   ? ? ?ALLERGIES:  has No Known Allergies. ? ?MEDICATIONS:  ?Current Outpatient Medications  ?Medication Sig Dispense Refill  ? lidocaine-prilocaine (EMLA) cream Apply one application topically the the affected area(s) daily as needed. 30 g 3  ? loratadine (CLARITIN) 10 MG tablet Take 1 tablet (10 mg total) by mouth daily. 30 tablet 2  ? morphine (MS CONTIN) 15 MG 12 hr tablet Take 1 tablet (15 mg total) by mouth every 12 (twelve) hours. 60 tablet 0  ? Oxycodone HCl 10 MG TABS Take 1 tablet (10 mg total) by mouth 2 (two) times daily as needed (pain). 60 tablet 0  ? albuterol (VENTOLIN HFA) 108 (90 Base) MCG/ACT inhaler Inhale 2 puffs into the lungs every 6 (six) hours as needed for  wheezing or shortness of breath. 6.7 g 2  ? aspirin EC 81 MG tablet Take 1 tablet (81 mg total) by mouth daily. Swallow whole. (Patient not taking: Reported on 12/21/2021) 30 tablet 11  ? buPROPion (WELLBUTRIN SR) 150 MG

## 2022-01-18 NOTE — Patient Instructions (Signed)
[facility] CANCER CTR AT [place]  Discharge Instructions: ?Thank you for choosing [facility] to provide your oncology and hematology care.  ?If you have a lab appointment with the [facility], please go directly to the [facility] and check in at the registration area. ? ?Wear comfortable clothing and clothing appropriate for easy access to any Portacath or PICC line.  ? ?We strive to give you quality time with your provider. You may need to reschedule your appointment if you arrive late (15 or more minutes).  Arriving late affects you and other patients whose appointments are after yours.  Also, if you miss three or more appointments without notifying the office, you may be dismissed from the clinic at the provider?s discretion.    ?  ?For prescription refill requests, have your pharmacy contact our office and allow 72 hours for refills to be completed.   ? ?Today you received the following chemotherapy and/or immunotherapy agents: Tecentriq ?    ?  ?To help prevent nausea and vomiting after your treatment, we encourage you to take your nausea medication as directed. ? ?BELOW ARE SYMPTOMS THAT SHOULD BE REPORTED IMMEDIATELY: ?*FEVER GREATER THAN 100.4 F (38 ?C) OR HIGHER ?*CHILLS OR SWEATING ?*NAUSEA AND VOMITING THAT IS NOT CONTROLLED WITH YOUR NAUSEA MEDICATION ?*UNUSUAL SHORTNESS OF BREATH ?*UNUSUAL BRUISING OR BLEEDING ?*URINARY PROBLEMS (pain or burning when urinating, or frequent urination) ?*BOWEL PROBLEMS (unusual diarrhea, constipation, pain near the anus) ?TENDERNESS IN MOUTH AND THROAT WITH OR WITHOUT PRESENCE OF ULCERS (sore throat, sores in mouth, or a toothache) ?UNUSUAL RASH, SWELLING OR PAIN  ?UNUSUAL VAGINAL DISCHARGE OR ITCHING  ? ?Items with * indicate a potential emergency and should be followed up as soon as possible or go to the Emergency Department if any problems should occur. ? ?Please show the CHEMOTHERAPY ALERT CARD or IMMUNOTHERAPY ALERT CARD at check-in  to the Emergency Department and triage nurse. ? ?Should you have questions after your visit or need to cancel or reschedule your appointment, please contact [facility] CANCER [place] AT [place]  [phone] and follow the prompts.  Office hours are 8:00 a.m. to 4:30 p.m. Monday - Friday. Please note that voicemails left after 4:00 p.m. may not be returned until the following business day.  We are closed weekends and major holidays. You have access to a nurse at all times for urgent questions. Please call the main number to the clinic [phone] and follow the prompts. ? ?For any non-urgent questions, you may also contact your provider using MyChart. We now offer e-Visits for anyone 41 and older to request care online for non-urgent symptoms. For details visit [email or web address]. ?  ?Also download the MyChart app! Go to the app store, search "MyChart", open the app, select [facility], and log in with your MyChart username and password. ? ?Due to Covid, a mask is required upon entering the hospital/clinic. If you do not have a mask, one will be given to you upon arrival. For doctor visits, patients may have 1 support person aged 3 or older with them. For treatment visits, patients cannot have anyone with them due to current Covid guidelines and our immunocompromised population. Atezolizumab injection ?What is this medication? ?ATEZOLIZUMAB (a te zoe LIZ ue mab) is a monoclonal antibody. It is used to treat bladder cancer (urothelial cancer), liver cancer, lung cancer, and melanoma. ?This medicine may be used for other purposes; ask your health care provider or pharmacist if you have questions. ?COMMON BRAND NAME(S): Tecentriq ?What  should I tell my care team before I take this medication? ?They need to know if you have any of these conditions: ?autoimmune diseases like Crohn's disease, ulcerative colitis, or lupus ?have had or planning to have an allogeneic stem cell transplant (uses someone else's  stem cells) ?history of organ transplant ?history of radiation to the chest ?nervous system problems like myasthenia gravis or Guillain-Barre syndrome ?an unusual or allergic reaction to atezolizumab, other medicines, foods, dyes, or preservatives ?pregnant or trying to get pregnant ?breast-feeding ?How should I use this medication? ?This medicine is for infusion into a vein. It is given by a health care professional in a hospital or clinic setting. ?A special MedGuide will be given to you before each treatment. Be sure to read this information carefully each time. ?Talk to your pediatrician regarding the use of this medicine in children. Special care may be needed. ?Overdosage: If you think you have taken too much of this medicine contact a poison control center or emergency room at once. ?NOTE: This medicine is only for you. Do not share this medicine with others. ?What if I miss a dose? ?It is important not to miss your dose. Call your doctor or health care professional if you are unable to keep an appointment. ?What may interact with this medication? ?Interactions have not been studied. ?This list may not describe all possible interactions. Give your health care provider a list of all the medicines, herbs, non-prescription drugs, or dietary supplements you use. Also tell them if you smoke, drink alcohol, or use illegal drugs. Some items may interact with your medicine. ?What should I watch for while using this medication? ?Your condition will be monitored carefully while you are receiving this medicine. ?You may need blood work done while you are taking this medicine. ?Do not become pregnant while taking this medicine or for at least 5 months after stopping it. Women should inform their doctor if they wish to become pregnant or think they might be pregnant. There is a potential for serious side effects to an unborn child. Talk to your health care professional or pharmacist for more information. Do not  breast-feed an infant while taking this medicine or for at least 5 months after the last dose. ?What side effects may I notice from receiving this medication? ?Side effects that you should report to your doctor or health care professional as soon as possible: ?allergic reactions like skin rash, itching or hives, swelling of the face, lips, or tongue ?black, tarry stools ?bloody or watery diarrhea ?breathing problems ?changes in vision ?chest pain or chest tightness ?chills ?facial flushing ?fever ?headache ?signs and symptoms of high blood sugar such as dizziness; dry mouth; dry skin; fruity breath; nausea; stomach pain; increased hunger or thirst; increased urination ?signs and symptoms of liver injury like dark yellow or brown urine; general ill feeling or flu-like symptoms; light-colored stools; loss of appetite; nausea; right upper belly pain; unusually weak or tired; yellowing of the eyes or skin ?stomach pain ?trouble passing urine or change in the amount of urine ?Side effects that usually do not require medical attention (report to your doctor or health care professional if they continue or are bothersome): ?bone pain ?cough ?diarrhea ?joint pain ?muscle pain ?muscle weakness ?swelling of arms or legs ?tiredness ?weight loss ?This list may not describe all possible side effects. Call your doctor for medical advice about side effects. You may report side effects to FDA at 1-800-FDA-1088. ?Where should I keep my medication? ?This drug is  given in a hospital or clinic and will not be stored at home. ?NOTE: This sheet is a summary. It may not cover all possible information. If you have questions about this medicine, talk to your doctor, pharmacist, or health care provider. ?? 2022 Elsevier/Gold Standard (2021-06-16 00:00:00) ? ?

## 2022-01-18 NOTE — Telephone Encounter (Signed)
Patient has Medicaid with prescription drug coverage.  No longer meets [facility]'s eligibility criteria.  Patient notified by letter. ? ?Stacie Kennedy ?Care Manager ?[facility] ? ? ?[address] ?             [place], [place]  [address] ? ?January 18, 2022 ? ? ? ?Dear Stacie Kennedy: ? ?This is to inform you that you are no longer eligible to receive medication assistance at [facility].  The reason(s) are:   ? ?_____Your total gross monthly household income exceeds 300% of the Federal Poverty Level.   ?_____Tangible assets (savings, checking, stocks/bonds, pension, retirement, etc.) exceeds our limit  ?_____You are eligible to receive benefits from [facility] or HIV Medication  ?          Assistance program ?_____You are eligible to receive benefits from a Medicare Part ?D? plan ?__X__You have prescription insurance with Medicaid ?_____You are not an [facility] resident ?_____Failure to provide all requested documentation (proof of income information for 2023, and/or Patient Intake Application, DOH Attestation, Contract, etc).   ? ?We regret that we are unable to help you at this time.  If your prescription coverage is terminated, please contact [facility], so that we may reassess your eligibility for our program.  If you have questions, we may be contacted at [phone]. ? ?Thank you, ? ?[facility]  ?

## 2022-01-29 ENCOUNTER — Other Ambulatory Visit: Payer: Self-pay

## 2022-02-08 ENCOUNTER — Inpatient Hospital Stay (HOSPITAL_BASED_OUTPATIENT_CLINIC_OR_DEPARTMENT_OTHER): Payer: Medicaid Other | Admitting: Internal Medicine

## 2022-02-08 ENCOUNTER — Inpatient Hospital Stay: Payer: Medicaid Other

## 2022-02-08 ENCOUNTER — Inpatient Hospital Stay: Payer: Medicaid Other | Attending: Radiation Oncology

## 2022-02-08 ENCOUNTER — Encounter: Payer: Self-pay | Admitting: Internal Medicine

## 2022-02-08 VITALS — BP 115/71 | HR 65 | Temp 98.7°F | Resp 19 | Wt 154.2 lb

## 2022-02-08 DIAGNOSIS — Z7982 Long term (current) use of aspirin: Secondary | ICD-10-CM | POA: Diagnosis not present

## 2022-02-08 DIAGNOSIS — E119 Type 2 diabetes mellitus without complications: Secondary | ICD-10-CM | POA: Diagnosis not present

## 2022-02-08 DIAGNOSIS — C3411 Malignant neoplasm of upper lobe, right bronchus or lung: Secondary | ICD-10-CM

## 2022-02-08 DIAGNOSIS — K5900 Constipation, unspecified: Secondary | ICD-10-CM | POA: Insufficient documentation

## 2022-02-08 DIAGNOSIS — M25511 Pain in right shoulder: Secondary | ICD-10-CM | POA: Diagnosis not present

## 2022-02-08 DIAGNOSIS — Z79899 Other long term (current) drug therapy: Secondary | ICD-10-CM | POA: Insufficient documentation

## 2022-02-08 DIAGNOSIS — Z5112 Encounter for antineoplastic immunotherapy: Secondary | ICD-10-CM | POA: Insufficient documentation

## 2022-02-08 DIAGNOSIS — Z86010 Personal history of colonic polyps: Secondary | ICD-10-CM | POA: Diagnosis not present

## 2022-02-08 DIAGNOSIS — Z809 Family history of malignant neoplasm, unspecified: Secondary | ICD-10-CM | POA: Diagnosis not present

## 2022-02-08 DIAGNOSIS — E876 Hypokalemia: Secondary | ICD-10-CM | POA: Diagnosis not present

## 2022-02-08 DIAGNOSIS — E7800 Pure hypercholesterolemia, unspecified: Secondary | ICD-10-CM | POA: Insufficient documentation

## 2022-02-08 DIAGNOSIS — J449 Chronic obstructive pulmonary disease, unspecified: Secondary | ICD-10-CM | POA: Insufficient documentation

## 2022-02-08 DIAGNOSIS — F17210 Nicotine dependence, cigarettes, uncomplicated: Secondary | ICD-10-CM | POA: Diagnosis not present

## 2022-02-08 DIAGNOSIS — Z7984 Long term (current) use of oral hypoglycemic drugs: Secondary | ICD-10-CM | POA: Diagnosis not present

## 2022-02-08 LAB — COMPREHENSIVE METABOLIC PANEL
ALT: 8 U/L (ref 0–44)
AST: 16 U/L (ref 15–41)
Albumin: 3.9 g/dL (ref 3.5–5.0)
Alkaline Phosphatase: 92 U/L (ref 38–126)
Anion gap: 7 (ref 5–15)
BUN: 16 mg/dL (ref 6–20)
CO2: 26 mmol/L (ref 22–32)
Calcium: 9.2 mg/dL (ref 8.9–10.3)
Chloride: 104 mmol/L (ref 98–111)
Creatinine, Ser: 0.57 mg/dL (ref 0.44–1.00)
GFR, Estimated: >60 mL/min (ref >60)
Glucose, Bld: 93 mg/dL (ref 70–99)
Potassium: 3.7 mmol/L (ref 3.5–5.1)
Sodium: 137 mmol/L (ref 135–145)
Total Bilirubin: 0.3 mg/dL (ref 0.3–1.2)
Total Protein: 7.3 g/dL (ref 6.5–8.1)

## 2022-02-08 LAB — CBC WITH DIFFERENTIAL/PLATELET
Abs Immature Granulocytes: 0.01 10*3/uL (ref 0.00–0.07)
Basophils Absolute: 0 10*3/uL (ref 0.0–0.1)
Basophils Relative: 1 %
Eosinophils Absolute: 0.1 10*3/uL (ref 0.0–0.5)
Eosinophils Relative: 2 %
HCT: 43.3 % (ref 36.0–46.0)
Hemoglobin: 14.4 g/dL (ref 12.0–15.0)
Immature Granulocytes: 0 %
Lymphocytes Relative: 52 %
Lymphs Abs: 3 10*3/uL (ref 0.7–4.0)
MCH: 30.4 pg (ref 26.0–34.0)
MCHC: 33.3 g/dL (ref 30.0–36.0)
MCV: 91.5 fL (ref 80.0–100.0)
Monocytes Absolute: 0.5 10*3/uL (ref 0.1–1.0)
Monocytes Relative: 9 %
Neutro Abs: 2 10*3/uL (ref 1.7–7.7)
Neutrophils Relative %: 36 %
Platelets: 270 10*3/uL (ref 150–400)
RBC: 4.73 MIL/uL (ref 3.87–5.11)
RDW: 15.8 % — ABNORMAL HIGH (ref 11.5–15.5)
WBC: 5.7 10*3/uL (ref 4.0–10.5)
nRBC: 0 % (ref 0.0–0.2)

## 2022-02-08 MED ORDER — HEPARIN SOD (PORK) LOCK FLUSH 100 UNIT/ML IV SOLN
500.0000 [IU] | Freq: Once | INTRAVENOUS | Status: AC
  Administered 2022-02-08: 500 [IU] via INTRAVENOUS
  Filled 2022-02-08: qty 5

## 2022-02-08 MED ORDER — SODIUM CHLORIDE 0.9% FLUSH
10.0000 mL | INTRAVENOUS | Status: DC | PRN
  Administered 2022-02-08: 10 mL via INTRAVENOUS
  Filled 2022-02-08: qty 10

## 2022-02-08 MED ORDER — HEPARIN SOD (PORK) LOCK FLUSH 100 UNIT/ML IV SOLN
500.0000 [IU] | Freq: Once | INTRAVENOUS | Status: DC | PRN
  Filled 2022-02-08: qty 5

## 2022-02-08 MED ORDER — SODIUM CHLORIDE 0.9 % IV SOLN
Freq: Once | INTRAVENOUS | Status: AC
  Filled 2022-02-08: qty 250

## 2022-02-08 MED ORDER — SODIUM CHLORIDE 0.9 % IV SOLN
1200.0000 mg | Freq: Once | INTRAVENOUS | Status: AC
  Administered 2022-02-08: 1200 mg via INTRAVENOUS
  Filled 2022-02-08: qty 20

## 2022-02-08 NOTE — Progress Notes (Signed)
[facility] ?CONSULT NOTE ? ?Patient Care Team: ?Cammie Sickle, MD as PCP - General (Internal Medicine) ?Telford Nab, RN as Sales executive ? ?CHIEF COMPLAINTS/PURPOSE OF CONSULTATION: lung cancer ? ?#  ?Oncology History Overview Note  ?# NOV -[id number] [facility] CANCER SCREENING PROGRAM]-18 mm right upper lobe lung nodule; DEC 2021- s/p right upper lobectomy [Dr. Roxan Hockey; [place]]; STAGE: I [pT-18 mm; LN-12=0]; predominant large cell neuroendocrine; minor adenocarcinoma.  Declines adjuvant chemotherapy. ? ?#Recurrent/stage IV cancer NOV 2022- PET scan-scapular lesion liver lesion gastrohepatic lymphadenopathy.  11/21- start RT to right scapular lesion ? ?# DEC 3rd, 2022- CARBO=ETOP+TECEN; udenyca #1.  ? ?NGS: Negative for any targetable mutation; PD-L1 0; KEPASAKE* ? ?# SURVIVORSHIP:  ? ?# GENETICS:  ? ? ? ? ? 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     Closest Margin(s) to Invasive Carcinoma: Bronchovascular margin  ?Treatment Effect: No known presurgical therapy  ?Regional Lymph Nodes:  ?     Number of Lymph Nodes Involved: 0  ?                          Nodal Sites with Tumor: Not applicable  ?     Number of Lymph Nodes Examined: 12  ? ? ?  ?Primary cancer of right upper lobe of lung ([facility])  ?11/03/2020 Initial Diagnosis  ? Primary cancer of right upper lobe of lung [facility]) ?  ?11/03/2020 Cancer Staging  ? Staging form: Lung, AJCC 8th Edition ?- Pathologic: Stage IA3 (pT1c, pN0, cM0) - Signed by Cammie Sickle, MD on 11/04/2020 ? ?   ?09/14/2021 -  Chemotherapy  ? Patient is on Treatment Plan : LUNG SCLC Carboplatin + Etoposide + Atezolizumab Induction q21d / Atezolizumab Maintenance q21d  ? ?  ?  ?11/09/2021 Cancer Staging  ? Staging form: Lung, AJCC 8th Edition ?- Pathologic: Stage IVB (pTX, pNX, cM1c) - Signed by Cammie Sickle, MD on 11/09/2021 ? ?  ? ? ? ?HISTORY OF PRESENTING ILLNESS: Ambulating independently.  Alone.  ? ?Risa Auman 61 y.o.  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  ? ?Patient notes continued improvement of the right shoulder pain.  She is on MS Contin and also oxycodone 1-2 today. ? ?No nausea no vomiting.  No headaches. Chronic shortness of breath-not any worse.   ? ?Review of Systems  ?Constitutional:  Negative for chills, diaphoresis, fever, malaise/fatigue and weight loss.  ?HENT:  Negative for nosebleeds and sore throat.   ?Eyes:  Negative for double vision.  ?Respiratory:  Negative for cough, hemoptysis, sputum production, shortness of breath and wheezing.   ?Cardiovascular:  Negative for chest pain, palpitations, orthopnea and leg swelling.  ?Gastrointestinal:  Negative for abdominal pain, blood in stool, constipation, diarrhea, heartburn, melena, nausea and vomiting.  ?Genitourinary:  Negative for dysuria, frequency and urgency.  ?Musculoskeletal:  Positive for back pain, joint pain and myalgias.  ?Skin: Negative.  Negative for itching and rash.  ?Neurological:  Negative for dizziness, tingling, focal weakness, weakness  and headaches.  ?Endo/Heme/Allergies:  Does not bruise/bleed easily.  ?Psychiatric/Behavioral:  Negative for depression. The patient is not nervous/anxious and does not have insomnia.    ? ?MEDICAL HISTORY:  ?Past Medical History:  ?Diagnosis Date  ? Cancer [facility])   ? lung cancer  ? Depression   ? Duodenitis   ? Dyspnea   ? Family history of cancer   ? High cholesterol   ? Personal  history of colonic polyps   ? Pre-diabetes   ? Umbilical hernia   ? UTI (urinary tract infection)   ? ? ?SURGICAL HISTORY: ?Past Surgical History:  ?Procedure Laterality Date  ? COLONOSCOPY WITH PROPOFOL N/A 03/24/2021  ? Procedure: COLONOSCOPY WITH PROPOFOL;  Surgeon: Jonathon Bellows, MD;  Location: [facility] ENDOSCOPY;  Service: Gastroenterology;  Laterality: N/A;  ? INTERCOSTAL NERVE BLOCK  09/19/2020  ? Procedure: INTERCOSTAL NERVE BLOCK;  Surgeon: Melrose Nakayama, MD;  Location: [place];  Service: Thoracic;;  ? IR IMAGING GUIDED PORT INSERTION  09/23/2021  ? LUNG LOBECTOMY Right   ? LUNG REMOVAL, PARTIAL Right 09/19/2020  ? NODE DISSECTION Right 09/19/2020  ? Procedure: NODE DISSECTION;  Surgeon: Melrose Nakayama, MD;  Location: [place];  Service: Thoracic;  Laterality: Right;  ? VIDEO BRONCHOSCOPY N/A 07/08/2021  ? Procedure: VIDEO BRONCHOSCOPY;  Surgeon: Melrose Nakayama, MD;  Location: [place];  Service: Thoracic;  Laterality: N/A;  ? ? ?SOCIAL HISTORY: ?Social History  ? ?Socioeconomic History  ? Marital status: Single  ?  Spouse name: Not on file  ? Number of children: Not on file  ? Years of education: Not on file  ? Highest education level: Not on file  ?Occupational History  ? Not on file  ?Tobacco Use  ? Smoking status: Some Days  ?  Packs/day: 0.25  ?  Years: 43.00  ?  Pack years: 10.75  ?  Types: Cigarettes  ? Smokeless tobacco: Never  ? Tobacco comments:  ?  states she is slowing, trying to quit  ?Vaping Use  ? Vaping Use: Never used  ?Substance and Sexual Activity  ? Alcohol use: Yes  ?  Alcohol/week: 2.0 standard drinks  ?  Types: 2 Cans of beer per week  ?  Comment: occasional  ? Drug use: No  ? Sexual activity: Not on file  ?Other Topics Concern  ? Not on file  ?Social History Narrative  ? Lives in [place]; smokes; now and then beer; with boy friend. 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   ? ?Social Determinants of Health  ? ?Financial Resource Strain: Not on file  ?Food  Insecurity: Food Insecurity Present  ? Worried About Charity fundraiser in the Last Year: Sometimes true  ? Ran Out of Food in the Last Year: Sometimes true  ?Transportation Needs: No Transportation Needs  ? Lack of Transportation (Medical): No  ? Lack of Transportation (Non-Medical): No  ?Physical Activity: Insufficiently Active  ? Days of Exercise per Week: 7 days  ? Minutes of Exercise per Session: 20 min  ?Stress: Stress Concern Present  ? Feeling of Stress : To some extent  ?Social Connections: Socially Isolated  ? Frequency of Communication with Friends and Family: Never  ? Frequency of Social Gatherings with Friends and Family: Never  ? Attends Religious Services: Never  ? Active Member of Clubs or Organizations: No  ? Attends Archivist Meetings: Never  ? Marital Status: Never married  ?Intimate Partner Violence: Not on file  ? ? ?FAMILY HISTORY: ?  Family History  ?Problem Relation Age of Onset  ? Diabetes Mother   ? Cancer Mother   ? Diabetes Father   ? Stroke Father   ? Heart attack Father   ?     76s  ? Healthy Sister   ? Cancer Brother   ? Hepatitis Brother   ? Diabetes Brother   ? Hypertension Brother   ? Heart disease Brother   ? ? ?ALLERGIES:  has No Known Allergies. ? ?MEDICATIONS:  ?Current Outpatient Medications  ?Medication Sig Dispense Refill  ? albuterol (PROVENTIL HFA) 108 (90 Base) MCG/ACT inhaler Inhale 2 puffs into the lungs once every 6 (six) hours as needed for wheezing or shortness of breath. 6.7 g 2  ? lidocaine-prilocaine (EMLA) cream Apply one application topically the the affected area(s) daily as needed. 30 g 3  ? loratadine (CLARITIN) 10 MG tablet Take 1 tablet (10 mg total) by mouth daily. 30 tablet 2  ? metFORMIN (GLUCOPHAGE) 500 MG tablet TAKE ONE TABLET BY MOUTH EVERY MORNING WITH BREAKFAST 90 tablet 3  ? morphine (MS CONTIN) 15 MG 12 hr tablet Take 1 tablet (15 mg total) by mouth every 12 (twelve) hours. 60 tablet 0  ? Oxycodone HCl 10 MG TABS Take 1 tablet (10 mg  total) by mouth 2 (two) times daily as needed (pain). 60 tablet 0  ? aspirin EC 81 MG tablet Take 1 tablet (81 mg total) by mouth daily. Swallow whole. (Patient not taking: Reported on 12/21/2021) 30 tablet 11  ? buPROPion

## 2022-02-08 NOTE — Patient Instructions (Signed)
[facility] CANCER CTR AT [place]  Discharge Instructions: ?Thank you for choosing [facility] to provide your oncology and hematology care.  ?If you have a lab appointment with the [facility], please go directly to the [facility] and check in at the registration area. ? ?Wear comfortable clothing and clothing appropriate for easy access to any Portacath or PICC line.  ? ?We strive to give you quality time with your provider. You may need to reschedule your appointment if you arrive late (15 or more minutes).  Arriving late affects you and other patients whose appointments are after yours.  Also, if you miss three or more appointments without notifying the office, you may be dismissed from the clinic at the provider?s discretion.    ?  ?For prescription refill requests, have your pharmacy contact our office and allow 72 hours for refills to be completed.   ? ?Today you received the following chemotherapy and/or immunotherapy agents Tecentriq    ?  ?To help prevent nausea and vomiting after your treatment, we encourage you to take your nausea medication as directed. ? ?BELOW ARE SYMPTOMS THAT SHOULD BE REPORTED IMMEDIATELY: ?*FEVER GREATER THAN 100.4 F (38 ?C) OR HIGHER ?*CHILLS OR SWEATING ?*NAUSEA AND VOMITING THAT IS NOT CONTROLLED WITH YOUR NAUSEA MEDICATION ?*UNUSUAL SHORTNESS OF BREATH ?*UNUSUAL BRUISING OR BLEEDING ?*URINARY PROBLEMS (pain or burning when urinating, or frequent urination) ?*BOWEL PROBLEMS (unusual diarrhea, constipation, pain near the anus) ?TENDERNESS IN MOUTH AND THROAT WITH OR WITHOUT PRESENCE OF ULCERS (sore throat, sores in mouth, or a toothache) ?UNUSUAL RASH, SWELLING OR PAIN  ?UNUSUAL VAGINAL DISCHARGE OR ITCHING  ? ?Items with * indicate a potential emergency and should be followed up as soon as possible or go to the Emergency Department if any problems should occur. ? ?Please show the CHEMOTHERAPY ALERT CARD or IMMUNOTHERAPY ALERT CARD at check-in to  the Emergency Department and triage nurse. ? ?Should you have questions after your visit or need to cancel or reschedule your appointment, please contact [facility] CANCER [place] AT [place]  [phone] and follow the prompts.  Office hours are 8:00 a.m. to 4:30 p.m. Monday - Friday. Please note that voicemails left after 4:00 p.m. may not be returned until the following business day.  We are closed weekends and major holidays. You have access to a nurse at all times for urgent questions. Please call the main number to the clinic [phone] and follow the prompts. ? ?For any non-urgent questions, you may also contact your provider using MyChart. We now offer e-Visits for anyone 9 and older to request care online for non-urgent symptoms. For details visit [email or web address]. ?  ?Also download the MyChart app! Go to the app store, search "MyChart", open the app, select [facility], and log in with your MyChart username and password. ? ?Due to Covid, a mask is required upon entering the hospital/clinic. If you do not have a mask, one will be given to you upon arrival. For doctor visits, patients may have 1 support person aged 20 or older with them. For treatment visits, patients cannot have anyone with them due to current Covid guidelines and our immunocompromised population.  ?

## 2022-02-08 NOTE — Patient Instructions (Signed)
#  Recommend calcium plus vitamin D supplement/over-the-counter twice a day.  ?

## 2022-02-08 NOTE — Assessment & Plan Note (Signed)
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 s/p carbo etoposide-Tecentriq. FEB 17th 2023- S/p 3 cycles-CT scan CAP- stable liver lesion; improved metastatic abdominal lymphadenopathy; healing/sclerosis noted of the right scapular lesion.  No new disease. ? ?# proceed with Tecentriq #3 maintenance today. Labs today reviewed;  acceptable for treatment today. Will order scan today.  ? ?# mets to bone/ Pain right shoulder- currently s/p RT [ 09/16/2021]; continue with MS contin BID: and oxycodone 10 mg prn [?1-2/day]. . Added zometa;/discussed re: Hypocalcemia; ONJ. Agrees to proceed. Recommend ca+vit D BID.   ? ?# Hypokalemia: K- 3.2; discussed re: dietary supp-STABLE.    ? ?# constipation:  Miralax BID; fluids; Dulcolax- STABLE.    ? ?# DM:  On Metformin. stable' ? ?# COPD: refilled albuterol;STABLE; ? ?#IV access: functional ? ?# DISPOSITION: ?# Tecentriq today;  ?# follow up in 3 weeks- MD- port lab- cbc/cmp; tecentriq' Zometa-; CT CAP prior--Dr.B ?

## 2022-02-24 ENCOUNTER — Ambulatory Visit: Payer: Medicaid Other

## 2022-02-24 ENCOUNTER — Telehealth: Payer: Self-pay | Admitting: *Deleted

## 2022-02-24 NOTE — Telephone Encounter (Signed)
Pt requesting to reschedule her appt for CT today to another day this week if possible. States that she forgot to pick up her prep prior to CT. Please notify her with new appt once scheduled [phone]. ?

## 2022-03-01 ENCOUNTER — Inpatient Hospital Stay: Payer: Medicaid Other

## 2022-03-01 ENCOUNTER — Encounter: Payer: Self-pay | Admitting: Internal Medicine

## 2022-03-01 ENCOUNTER — Inpatient Hospital Stay (HOSPITAL_BASED_OUTPATIENT_CLINIC_OR_DEPARTMENT_OTHER): Payer: Medicaid Other | Admitting: Internal Medicine

## 2022-03-01 DIAGNOSIS — C3490 Malignant neoplasm of unspecified part of unspecified bronchus or lung: Secondary | ICD-10-CM

## 2022-03-01 DIAGNOSIS — C3411 Malignant neoplasm of upper lobe, right bronchus or lung: Secondary | ICD-10-CM

## 2022-03-01 DIAGNOSIS — Z5112 Encounter for antineoplastic immunotherapy: Secondary | ICD-10-CM | POA: Diagnosis not present

## 2022-03-01 LAB — CBC WITH DIFFERENTIAL/PLATELET
Abs Immature Granulocytes: 0.07 10*3/uL (ref 0.00–0.07)
Basophils Absolute: 0 10*3/uL (ref 0.0–0.1)
Basophils Relative: 1 %
Eosinophils Absolute: 0.1 10*3/uL (ref 0.0–0.5)
Eosinophils Relative: 1 %
HCT: 43.6 % (ref 36.0–46.0)
Hemoglobin: 14 g/dL (ref 12.0–15.0)
Immature Granulocytes: 1 %
Lymphocytes Relative: 48 %
Lymphs Abs: 3 10*3/uL (ref 0.7–4.0)
MCH: 29.4 pg (ref 26.0–34.0)
MCHC: 32.1 g/dL (ref 30.0–36.0)
MCV: 91.6 fL (ref 80.0–100.0)
Monocytes Absolute: 0.6 10*3/uL (ref 0.1–1.0)
Monocytes Relative: 9 %
Neutro Abs: 2.5 10*3/uL (ref 1.7–7.7)
Neutrophils Relative %: 40 %
Platelets: 341 10*3/uL (ref 150–400)
RBC: 4.76 MIL/uL (ref 3.87–5.11)
RDW: 14.6 % (ref 11.5–15.5)
WBC: 6.2 10*3/uL (ref 4.0–10.5)
nRBC: 0 % (ref 0.0–0.2)

## 2022-03-01 LAB — COMPREHENSIVE METABOLIC PANEL
ALT: 8 U/L (ref 0–44)
AST: 16 U/L (ref 15–41)
Albumin: 3.4 g/dL — ABNORMAL LOW (ref 3.5–5.0)
Alkaline Phosphatase: 87 U/L (ref 38–126)
Anion gap: 4 — ABNORMAL LOW (ref 5–15)
BUN: 11 mg/dL (ref 6–20)
CO2: 25 mmol/L (ref 22–32)
Calcium: 8.3 mg/dL — ABNORMAL LOW (ref 8.9–10.3)
Chloride: 109 mmol/L (ref 98–111)
Creatinine, Ser: 0.51 mg/dL (ref 0.44–1.00)
GFR, Estimated: >60 mL/min (ref >60)
Glucose, Bld: 110 mg/dL — ABNORMAL HIGH (ref 70–99)
Potassium: 3.6 mmol/L (ref 3.5–5.1)
Sodium: 138 mmol/L (ref 135–145)
Total Bilirubin: 0.3 mg/dL (ref 0.3–1.2)
Total Protein: 6.8 g/dL (ref 6.5–8.1)

## 2022-03-01 MED ORDER — SODIUM CHLORIDE 0.9% FLUSH
10.0000 mL | INTRAVENOUS | Status: AC | PRN
  Administered 2022-03-01: 10 mL via INTRAVENOUS
  Filled 2022-03-01: qty 10

## 2022-03-01 MED ORDER — HEPARIN SOD (PORK) LOCK FLUSH 100 UNIT/ML IV SOLN
500.0000 [IU] | Freq: Once | INTRAVENOUS | Status: AC | PRN
  Administered 2022-03-01: 500 [IU]
  Filled 2022-03-01: qty 5

## 2022-03-01 MED ORDER — ZOLEDRONIC ACID 4 MG/100ML IV SOLN
4.0000 mg | Freq: Once | INTRAVENOUS | Status: AC
  Administered 2022-03-01: 4 mg via INTRAVENOUS
  Filled 2022-03-01: qty 100

## 2022-03-01 MED ORDER — SODIUM CHLORIDE 0.9 % IV SOLN
1200.0000 mg | Freq: Once | INTRAVENOUS | Status: AC
  Administered 2022-03-01: 1200 mg via INTRAVENOUS
  Filled 2022-03-01: qty 20

## 2022-03-01 MED ORDER — SODIUM CHLORIDE 0.9 % IV SOLN
Freq: Once | INTRAVENOUS | Status: AC
  Filled 2022-03-01: qty 250

## 2022-03-01 MED ORDER — HEPARIN SOD (PORK) LOCK FLUSH 100 UNIT/ML IV SOLN
500.0000 [IU] | Freq: Once | INTRAVENOUS | Status: AC
  Filled 2022-03-01: qty 5

## 2022-03-01 NOTE — Progress Notes (Signed)
[place] CONSULT NOTE  Patient Care Team: Cammie Sickle, MD as PCP - General (Internal Medicine) Telford Nab, RN as Oncology Nurse Navigator  CHIEF COMPLAINTS/PURPOSE OF CONSULTATION: lung cancer  #  Oncology History Overview Note  # NOV -[id number] [facility] CANCER SCREENING PROGRAM]-18 mm right upper lobe lung nodule; DEC 2021- s/p right upper lobectomy [Dr. Roxan Hockey; [place]]; STAGE: I [pT-18 mm; LN-12=0]; predominant large cell neuroendocrine; minor adenocarcinoma.  Declines adjuvant chemotherapy.  #Recurrent/stage IV cancer NOV 2022- PET scan-scapular lesion liver lesion gastrohepatic lymphadenopathy.  11/21- start RT to right scapular lesion  # DEC 3rd, 2022- CARBO=ETOP+TECEN; udenyca #1.   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  Visceral Pleura Invasion: Not identified  Direct Invasion of Adjacent Structures: No adjacent structures present  Lymphovascular Invasion: Not identified  Margins: All margins negative for invasive carcinoma       Closest Margin(s) to Invasive Carcinoma: Bronchovascular margin  Treatment Effect: No known presurgical therapy  Regional Lymph Nodes:       Number of Lymph Nodes Involved: 0                            Nodal Sites with Tumor: Not applicable       Number of Lymph Nodes Examined: 12      Primary cancer of right upper lobe of lung ([place])  11/03/2020 Initial Diagnosis   Primary cancer of right upper lobe of lung ([place])   11/03/2020 Cancer Staging   Staging form: Lung, AJCC 8th Edition - Pathologic: Stage IA3 (pT1c, pN0, cM0) - Signed by Cammie Sickle, MD on 11/04/2020     09/14/2021 -  Chemotherapy   Patient is on Treatment Plan : LUNG SCLC Carboplatin + Etoposide + Atezolizumab Induction q21d / Atezolizumab Maintenance q21d      11/09/2021 Cancer Staging   Staging form: Lung, AJCC 8th Edition - Pathologic: Stage IVB (pTX, pNX, cM1c) - Signed by Cammie Sickle, MD on 11/09/2021      HISTORY OF PRESENTING ILLNESS: Ambulating independently.  Alone.   Stacie Kennedy 61 y.o.  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   Patient awaiting scans tomorrow.  Patient denies any worsening pain in the right shoulder.  Patient states to be on MS Contin  BID;  oxycodone up 1 to 2 a day.  No nausea no vomiting.  No headaches. Chronic shortness of breath-not any worse.   Review of Systems  Constitutional:  Negative for chills, diaphoresis, fever, malaise/fatigue and weight loss.  HENT:  Negative for nosebleeds and sore throat.   Eyes:  Negative for double vision.  Respiratory:  Negative for cough, hemoptysis, sputum production, shortness of breath and wheezing.   Cardiovascular:  Negative for chest pain, palpitations, orthopnea and leg swelling.  Gastrointestinal:  Negative for abdominal pain, blood in stool, constipation, diarrhea, heartburn, melena, nausea and vomiting.  Genitourinary:  Negative for dysuria, frequency and urgency.  Musculoskeletal:  Positive for back pain, joint pain and myalgias.  Skin: Negative.  Negative for itching and rash.  Neurological:  Negative for dizziness, tingling, focal weakness, weakness and headaches.  Endo/Heme/Allergies:  Does not bruise/bleed easily.  Psychiatric/Behavioral:  Negative for depression. The patient is not nervous/anxious and does not have insomnia.     MEDICAL HISTORY:  Past Medical History:  Diagnosis Date   Cancer [facility])    lung cancer   Depression    Duodenitis    Dyspnea    Family history of  cancer    High cholesterol    Personal history of colonic polyps    Pre-diabetes    Umbilical hernia    UTI (urinary tract infection)     SURGICAL HISTORY: Past Surgical History:  Procedure Laterality Date   COLONOSCOPY WITH PROPOFOL N/A 03/24/2021   Procedure: COLONOSCOPY WITH PROPOFOL;  Surgeon: Jonathon Bellows, MD;  Location: [facility] ENDOSCOPY;  Service: Gastroenterology;  Laterality: N/A;   INTERCOSTAL NERVE BLOCK  09/19/2020   Procedure: INTERCOSTAL NERVE BLOCK;  Surgeon: Melrose Nakayama, MD;  Location: [place];  Service: Thoracic;;   IR IMAGING GUIDED PORT INSERTION  09/23/2021   LUNG LOBECTOMY Right    LUNG REMOVAL, PARTIAL Right 09/19/2020   NODE DISSECTION Right 09/19/2020   Procedure: NODE DISSECTION;  Surgeon: Melrose Nakayama, MD;  Location: [place];  Service: Thoracic;  Laterality: Right;   VIDEO BRONCHOSCOPY N/A 07/08/2021   Procedure: VIDEO BRONCHOSCOPY;  Surgeon: Melrose Nakayama, MD;  Location: [facility] OR;  Service: Thoracic;  Laterality: N/A;    SOCIAL HISTORY: Social History   Socioeconomic History   Marital status: Single    Spouse name: Not on file   Number of children: Not on file   Years of education: Not on file   Highest education level: Not on file  Occupational History   Not on file  Tobacco Use   Smoking status: Some Days    Packs/day: 0.25    Years: 43.00    Pack years: 10.75    Types: Cigarettes   Smokeless tobacco: Never   Tobacco comments:    states she is slowing, trying to quit  Vaping Use   Vaping Use: Never used  Substance and Sexual Activity   Alcohol use: Yes    Alcohol/week: 2.0 standard drinks    Types: 2 Cans of beer per week    Comment: occasional   Drug use: No   Sexual activity: Not on file  Other Topics Concern   Not on file  Social History Narrative   Lives in [place]; smokes; now and then beer; with boy friend. 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    Social Determinants of Health   Financial  Resource Strain: Not on file  Food Insecurity: Food Insecurity Present   Worried About [place] in the Last Year: Sometimes true   Ran Out of Food in the Last Year: Sometimes true  Transportation Needs: No Transportation Needs   Lack of Transportation (Medical): No   Lack of Transportation (Non-Medical): No  Physical Activity: Insufficiently Active   Days of Exercise per Week: 7 days   Minutes of Exercise per Session: 20 min  Stress: Stress Concern Present   Feeling of Stress : To some extent  Social Connections: Socially Isolated   Frequency of Communication with Friends and Family: Never   Frequency of Social Gatherings with Friends and Family: Never   Attends Religious Services: Never   Marine scientist or Organizations: No   Attends Archivist Meetings: Never   Marital Status: Never married  Human resources officer  Violence: Not on file    FAMILY HISTORY: Family History  Problem Relation Age of Onset   Diabetes Mother    Cancer Mother    Diabetes Father    Stroke Father    Heart attack Father        79s   Healthy Sister    Cancer Brother    Hepatitis Brother    Diabetes Brother    Hypertension Brother    Heart disease Brother     ALLERGIES:  has No Known Allergies.  MEDICATIONS:  Current Outpatient Medications  Medication Sig Dispense Refill   albuterol (PROVENTIL HFA) 108 (90 Base) MCG/ACT inhaler Inhale 2 puffs into the lungs once every 6 (six) hours as needed for wheezing or shortness of breath. 6.7 g 2   lidocaine-prilocaine (EMLA) cream Apply one application topically the the affected area(s) daily as needed. 30 g 3   loratadine (CLARITIN) 10 MG tablet Take 1 tablet (10 mg total) by mouth daily. 30 tablet 2   metFORMIN (GLUCOPHAGE) 500 MG tablet TAKE ONE TABLET BY MOUTH EVERY MORNING WITH BREAKFAST 90 tablet 3   morphine (MS CONTIN) 15 MG 12 hr tablet Take 1 tablet (15 mg total) by mouth every 12 (twelve) hours. 60 tablet 0   Oxycodone HCl  10 MG TABS Take 1 tablet (10 mg total) by mouth 2 (two) times daily as needed (pain). 60 tablet 0   aspirin EC 81 MG tablet Take 1 tablet (81 mg total) by mouth daily. Swallow whole. (Patient not taking: Reported on 12/21/2021) 30 tablet 11   buPROPion (WELLBUTRIN SR) 150 MG 12 hr tablet Take 1 tablet (150 mg total) by mouth 2 (two) times daily. (Patient not taking: Reported on 10/13/2021) 60 tablet 0   ondansetron (ZOFRAN) 4 MG tablet TAKE 2 TABLETS (8MG) BY MOUTH ONCE EVERY 8 HOURS AS NEEDED FOR NAUSEA OR VOMITING. (Patient not taking: Reported on 11/09/2021) 80 tablet 1   prochlorperazine (COMPAZINE) 10 MG tablet Take 1 tablet (10 mg total) by mouth once every 6 (six) hours as needed for nausea or vomiting. (Patient not taking: Reported on 11/09/2021) 40 tablet 1   rosuvastatin (CRESTOR) 10 MG tablet TAKE ONE TABLET BY MOUTH EVERY DAY (Patient not taking: Reported on 10/13/2021) 30 tablet 3   No current facility-administered medications for this visit.   Facility-Administered Medications Ordered in Other Visits  Medication Dose Route Frequency Provider Last Rate Last Admin   atezolizumab (TECENTRIQ) 1,200 mg in sodium chloride 0.9 % 250 mL chemo infusion  1,200 mg Intravenous Once Charlaine Dalton R, MD 540 mL/hr at 03/01/22 1046 1,200 mg at 03/01/22 1046   heparin lock flush 100 unit/mL  500 Units Intravenous Once Charlaine Dalton R, MD       heparin lock flush 100 unit/mL  500 Units Intracatheter Once PRN Cammie Sickle, MD       sodium chloride flush (NS) 0.9 % injection 10 mL  10 mL Intravenous PRN Cammie Sickle, MD   10 mL at 03/01/22 0857      .  PHYSICAL EXAMINATION: ECOG PERFORMANCE STATUS: 0 - Asymptomatic  Vitals:   03/01/22 0844  BP: 119/79  Pulse: 65  Temp: 97.8 F (36.6 C)  SpO2: 99%       Filed Weights   03/01/22 0844  Weight: 154 lb 12.8 oz (70.2 kg)        Physical Exam HENT:     Head: Normocephalic and atraumatic.     Mouth/Throat:  Pharynx: No oropharyngeal exudate.  Eyes:     Pupils: Pupils are equal, round, and reactive to light.  Cardiovascular:     Rate and Rhythm: Normal rate and regular rhythm.  Pulmonary:     Effort: Pulmonary effort is normal. No respiratory distress.     Breath sounds: Normal breath sounds. No wheezing.  Abdominal:     General: Bowel sounds are normal. There is no distension.     Palpations: Abdomen is soft. There is no mass.     Tenderness: There is no abdominal tenderness. There is no guarding or rebound.  Musculoskeletal:        General: No tenderness. Normal range of motion.     Cervical back: Normal range of motion and neck supple.  Skin:    General: Skin is warm.  Neurological:     Mental Status: She is alert and oriented to person, place, and time.  Psychiatric:        Mood and Affect: Affect normal.     LABORATORY DATA:  I have reviewed the data as listed Lab Results  Component Value Date   WBC 6.2 03/01/2022   HGB 14.0 03/01/2022   HCT 43.6 03/01/2022   MCV 91.6 03/01/2022   PLT 341 03/01/2022   Recent Labs    01/18/22 0840 02/08/22 0856 03/01/22 0846  NA 136 137 138  K 3.2* 3.7 3.6  CL 107 104 109  CO2 23 26 25   GLUCOSE 119* 93 110*  BUN 11 16 11   CREATININE 0.65 0.57 0.51  CALCIUM 8.9 9.2 8.3*  GFRNONAA >60 >60 >60  PROT 6.8 7.3 6.8  ALBUMIN 3.7 3.9 3.4*  AST 15 16 16   ALT 10 8 8   ALKPHOS 81 92 87  BILITOT 0.5 0.3 0.3    RADIOGRAPHIC STUDIES: I have personally reviewed the radiological images as listed and agreed with the findings in the report. No results found.   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 s/p carbo etoposide-Tecentriq. FEB 17th 2023- S/p 3 cycles-CT scan CAP- stable liver lesion; improved metastatic abdominal lymphadenopathy; healing/sclerosis noted of the  right scapular lesion.  No new disease.  # proceed with Tecentriq #4 maintenance today. Labs today reviewed;  acceptable for treatment today. Pending scans tomorrow.   # mets to bone/ Pain right shoulder- currently s/p RT [ 09/16/2021]; continue with MS contin BID: and oxycodone 10 mg prn [?1-2/day] prn.  Corrected calcium- NOrmal; ok with Zometa today/discussed re: Hypocalcemia; ONJ- OK today.  Continue ca+vit D BID.    # Hypokalemia: K- 3.2; discussed re: dietary supp-STABLE.  # constipation:  Miralax BID; fluids; Dulcolax- STABLE.     # DM:  On Metformin. stable'  # COPD: refilled albuterol;STABLE.  #IV access: functional  *CT on 5/23-tomorrow  Zometa q6 W # DISPOSITION: # Tecentriq today; Zometa today # follow up in 3 weeks- MD- port lab- cbc/cmp; tecentriq; ; -Dr.B    All questions were answered. The patient knows to call the clinic with any problems, questions or concerns.    Cammie Sickle, MD 03/01/2022 10:49 AM

## 2022-03-01 NOTE — Progress Notes (Signed)
CT sch'd for tomorrow.

## 2022-03-01 NOTE — Patient Instructions (Signed)
[facility]  Discharge Instructions: °Thank you for choosing [facility] to provide your oncology and hematology care.  ° °If you have a lab appointment with the Cancer Center, please go directly to the Cancer Center and check in at the registration area. °  °Wear comfortable clothing and clothing appropriate for easy access to any Portacath or PICC line.  ° °We strive to give you quality time with your provider. You may need to reschedule your appointment if you arrive late (15 or more minutes).  Arriving late affects you and other patients whose appointments are after yours.  Also, if you miss three or more appointments without notifying the office, you may be dismissed from the clinic at the provider’s discretion.    °  °For prescription refill requests, have your pharmacy contact our office and allow 72 hours for refills to be completed.   ° °Today you received the following chemotherapy and/or immunotherapy agents     °  °To help prevent nausea and vomiting after your treatment, we encourage you to take your nausea medication as directed. ° °BELOW ARE SYMPTOMS THAT SHOULD BE REPORTED IMMEDIATELY: °*FEVER GREATER THAN 100.4 F (38 °C) OR HIGHER °*CHILLS OR SWEATING °*NAUSEA AND VOMITING THAT IS NOT CONTROLLED WITH YOUR NAUSEA MEDICATION °*UNUSUAL SHORTNESS OF BREATH °*UNUSUAL BRUISING OR BLEEDING °*URINARY PROBLEMS (pain or burning when urinating, or frequent urination) °*BOWEL PROBLEMS (unusual diarrhea, constipation, pain near the anus) °TENDERNESS IN MOUTH AND THROAT WITH OR WITHOUT PRESENCE OF ULCERS (sore throat, sores in mouth, or a toothache) °UNUSUAL RASH, SWELLING OR PAIN  °UNUSUAL VAGINAL DISCHARGE OR ITCHING  ° °Items with * indicate a potential emergency and should be followed up as soon as possible or go to the Emergency Department if any problems should occur. ° °Please show the CHEMOTHERAPY ALERT CARD or IMMUNOTHERAPY ALERT CARD at check-in to the  Emergency Department and triage nurse. ° °Should you have questions after your visit or need to cancel or reschedule your appointment, please contact [facility]  Dept: [phone]  and follow the prompts.  Office hours are 8:00 a.m. to 4:30 p.m. Monday - Friday. Please note that voicemails left after 4:00 p.m. may not be returned until the following business day.  We are closed weekends and major holidays. You have access to a nurse at all times for urgent questions. Please call the main number to the clinic Dept: [phone] and follow the prompts. ° ° °For any non-urgent questions, you may also contact your provider using MyChart. We now offer e-Visits for anyone 18 and older to request care online for non-urgent symptoms. For details visit [email or web address]. °  °Also download the MyChart app! Go to the app store, search "MyChart", open the app, select [facility], and log in with your MyChart username and password. ° °Due to Covid, a mask is required upon entering the hospital/clinic. If you do not have a mask, one will be given to you upon arrival. For doctor visits, patients may have 1 support person aged 18 or older with them. For treatment visits, patients cannot have anyone with them due to current Covid guidelines and our immunocompromised population.  ° °

## 2022-03-01 NOTE — Assessment & Plan Note (Addendum)
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 s/p carbo etoposide-Tecentriq. FEB 17th 2023- S/p 3 cycles-CT scan CAP- stable liver lesion; improved metastatic abdominal lymphadenopathy; healing/sclerosis noted of the right scapular lesion.  No new disease.  # proceed with Tecentriq #4 maintenance today. Labs today reviewed;  acceptable for treatment today. Pending scans tomorrow.   # mets to bone/ Pain right shoulder- currently s/p RT [ 09/16/2021]; continue with MS contin BID: and oxycodone 10 mg prn [?1-2/day] prn.  Corrected calcium- NOrmal; ok with Zometa today/discussed re: Hypocalcemia; ONJ- OK today.  Continue ca+vit D BID.    # Hypokalemia: K- 3.2; discussed re: dietary supp-STABLE.  # constipation:  Miralax BID; fluids; Dulcolax- STABLE.     # DM:  On Metformin. stable'  # COPD: refilled albuterol;STABLE.  #IV access: functional  *CT on 5/23-tomorrow  Zometa q6 W # DISPOSITION: # Tecentriq today; Zometa today # follow up in 3 weeks- MD- port lab- cbc/cmp; tecentriq; ; -Dr.B

## 2022-03-02 ENCOUNTER — Ambulatory Visit: Admission: RE | Admit: 2022-03-02 | Payer: Medicaid Other | Source: Ambulatory Visit

## 2022-03-05 ENCOUNTER — Ambulatory Visit: Payer: Medicaid Other

## 2022-03-08 ENCOUNTER — Emergency Department: Payer: Medicaid Other

## 2022-03-08 ENCOUNTER — Encounter: Payer: Self-pay | Admitting: Emergency Medicine

## 2022-03-08 ENCOUNTER — Other Ambulatory Visit: Payer: Self-pay

## 2022-03-08 ENCOUNTER — Emergency Department
Admission: EM | Admit: 2022-03-08 | Discharge: 2022-03-08 | Disposition: A | Discharge status: Home / Self Care | Payer: Medicaid Other | Attending: Student in an Organized Health Care Education/Training Program | Admitting: Student in an Organized Health Care Education/Training Program

## 2022-03-08 DIAGNOSIS — R519 Headache, unspecified: Secondary | ICD-10-CM | POA: Diagnosis not present

## 2022-03-08 DIAGNOSIS — Z85118 Personal history of other malignant neoplasm of bronchus and lung: Secondary | ICD-10-CM | POA: Insufficient documentation

## 2022-03-08 DIAGNOSIS — H02401 Unspecified ptosis of right eyelid: Secondary | ICD-10-CM | POA: Diagnosis not present

## 2022-03-08 DIAGNOSIS — H538 Other visual disturbances: Secondary | ICD-10-CM | POA: Diagnosis present

## 2022-03-08 DIAGNOSIS — F17200 Nicotine dependence, unspecified, uncomplicated: Secondary | ICD-10-CM | POA: Diagnosis not present

## 2022-03-08 LAB — BASIC METABOLIC PANEL
Anion gap: 10 (ref 5–15)
BUN: 14 mg/dL (ref 6–20)
CO2: 28 mmol/L (ref 22–32)
Calcium: 9.4 mg/dL (ref 8.9–10.3)
Chloride: 100 mmol/L (ref 98–111)
Creatinine, Ser: 0.48 mg/dL (ref 0.44–1.00)
GFR, Estimated: >60 mL/min (ref >60)
Glucose, Bld: 99 mg/dL (ref 70–99)
Potassium: 3.7 mmol/L (ref 3.5–5.1)
Sodium: 138 mmol/L (ref 135–145)

## 2022-03-08 LAB — CBC WITH DIFFERENTIAL/PLATELET
Abs Immature Granulocytes: 0.01 10*3/uL (ref 0.00–0.07)
Basophils Absolute: 0.1 10*3/uL (ref 0.0–0.1)
Basophils Relative: 1 %
Eosinophils Absolute: 0.1 10*3/uL (ref 0.0–0.5)
Eosinophils Relative: 2 %
HCT: 49.8 % — ABNORMAL HIGH (ref 36.0–46.0)
Hemoglobin: 16.4 g/dL — ABNORMAL HIGH (ref 12.0–15.0)
Immature Granulocytes: 0 %
Lymphocytes Relative: 40 %
Lymphs Abs: 2.3 10*3/uL (ref 0.7–4.0)
MCH: 29.9 pg (ref 26.0–34.0)
MCHC: 32.9 g/dL (ref 30.0–36.0)
MCV: 90.9 fL (ref 80.0–100.0)
Monocytes Absolute: 0.6 10*3/uL (ref 0.1–1.0)
Monocytes Relative: 10 %
Neutro Abs: 2.7 10*3/uL (ref 1.7–7.7)
Neutrophils Relative %: 47 %
Platelets: 379 10*3/uL (ref 150–400)
RBC: 5.48 MIL/uL — ABNORMAL HIGH (ref 3.87–5.11)
RDW: 13.8 % (ref 11.5–15.5)
WBC: 5.7 10*3/uL (ref 4.0–10.5)
nRBC: 0 % (ref 0.0–0.2)

## 2022-03-08 MED ORDER — PROPARACAINE HCL 0.5 % OP SOLN
1.0000 [drp] | Freq: Once | OPHTHALMIC | Status: AC
  Administered 2022-03-08: 1 [drp] via OPHTHALMIC
  Filled 2022-03-08: qty 15

## 2022-03-08 MED ORDER — IOHEXOL 350 MG/ML SOLN
75.0000 mL | Freq: Once | INTRAVENOUS | Status: AC | PRN
  Administered 2022-03-08: 75 mL via INTRAVENOUS

## 2022-03-08 MED ORDER — PREDNISONE 10 MG PO TABS
ORAL_TABLET | ORAL | 0 refills | Status: DC
  Filled 2022-03-08: qty 21, 6d supply, fill #0

## 2022-03-08 MED ORDER — GADOBUTROL 1 MMOL/ML IV SOLN
7.0000 mL | Freq: Once | INTRAVENOUS | Status: AC | PRN
  Administered 2022-03-08: 7 mL via INTRAVENOUS

## 2022-03-08 MED ORDER — KETOROLAC TROMETHAMINE 15 MG/ML IJ SOLN
15.0000 mg | Freq: Once | INTRAMUSCULAR | Status: AC
  Administered 2022-03-08: 15 mg via INTRAVENOUS
  Filled 2022-03-08: qty 1

## 2022-03-08 MED ORDER — FLUORESCEIN SODIUM 1 MG OP STRP
1.0000 | ORAL_STRIP | Freq: Once | OPHTHALMIC | Status: AC
  Administered 2022-03-08: 1 via OPHTHALMIC
  Filled 2022-03-08: qty 1

## 2022-03-08 NOTE — ED Triage Notes (Signed)
Pt reports right eye red, watery and achy since last week. Denies injuries

## 2022-03-08 NOTE — ED Provider Notes (Signed)
Assumed care of this patient from Dr. Leory Plowman at approximately 1928.  Plan is to await MRI results and speak with Dr. George Ina about plan for admission versus discharge with follow-up.  MRI shows extensive edema and enhancement in the right retrobulbar fat, suggestive of possible postseptal cellulitis versus idiopathic orbital inflammatory disease.  Spoke with Dr. George Ina and agreed that this was unlikely to be postnasal cellulitis given her clinical presentation.  We will provide her with a prednisone burst and have her follow-up with Dr. George Ina within the next few days.  Spoke with the patient and advised her to return to the emergency department at any time if she begins to experience any new symptoms until then, particularly any signs of infection.  She was agreeable to the plan.  We will plan to discharge.  Mardee Postin, PA-C 03/08/2022 2030.    Teodoro Spray, [place] 03/08/22 2031    Naaman Plummer, MD 03/09/22 [phone]

## 2022-03-08 NOTE — ED Notes (Signed)
This RN first encounter with pt prior to discharge. Pt verbalized understanding of discharge instructions, prescriptions, and follow-up care instructions. Pt advised if symptoms worsen to return to ED.  

## 2022-03-08 NOTE — ED Provider Notes (Signed)
[facility] Provider Note    Event Date/Time   First MD Initiated Contact with Patient 03/08/22 1102     (approximate)   History   Eye Problem   HPI  Stacie Kennedy is a 61 y.o. female with a past medical history of lung cancer on chemotherapy who presents today for evaluation of right eye irritation and blurry vision for the past 6 days.  She reports that she has a right sided headache.  She has not had any vomiting.  She reports that she has double vision when looking up.  She also reports that she has had mild swelling of her right eyelid.  She denies any pain with moving her eyes.  She has not noticed any periorbital edema or erythema.  No fevers or chills.  No neck pain or dizziness.  Patient Active Problem List   Diagnosis Date Noted   Acute pain of right shoulder 07/14/2021   Cyst of bone of left hand 07/14/2021   Shortness of breath 07/14/2021   Genetic testing 05/27/2021   Family history of cancer 04/09/2021   Personal history of colonic polyps 04/09/2021   Health care maintenance 03/19/2021   Major depressive disorder 01/01/2021   Primary cancer of right upper lobe of lung ([place]) 11/03/2020   S/P lobectomy of lung 09/19/2020   Lung mass 08/28/2020   Encounter for screening colonoscopy 05/22/2020   Prediabetes 05/22/2020   Elevated lipids 05/22/2020   Stress incontinence of urine 05/22/2020   Encounter to establish care 04/24/2020   Urinary tract infection symptoms 04/24/2020   Smoking 04/24/2020          Physical Exam   Triage Vital Signs: ED Triage Vitals  Enc Vitals Group     BP 03/08/22 1045 122/87     Pulse Rate 03/08/22 1045 74     Resp 03/08/22 1045 16     Temp 03/08/22 1045 98 F (36.7 C)     Temp Source 03/08/22 1045 Oral     SpO2 03/08/22 1045 98 %     Weight 03/08/22 1043 154 lb 5.2 oz (70 kg)     Height 03/08/22 1043 5\' 7"  (1.702 m)     Head Circumference --      Peak Flow --      Pain Score 03/08/22 1043 10      Pain Loc --      Pain Edu? --      Excl. in [place]? --     Most recent vital signs: Vitals:   03/08/22 1045  BP: 122/87  Pulse: 74  Resp: 16  Temp: 98 F (36.7 C)  SpO2: 98%    Physical Exam Vitals and nursing note reviewed.  Constitutional:      General: Awake and alert. No acute distress.    Appearance: Normal appearance. She is well-developed and normal weight.  HENT:     Head: Normocephalic and atraumatic.     Mouth/Throat:     Mouth: Mucous membranes are moist.  Eyes:     General: PERRL. Normal EOMs        Right eye: No discharge.  No appreciable scleral injection.  Pupil round and reactive to light.  Ocular pressure 14.  No fluorescein uptake.  Full and painless extraocular movements, the patient reports diplopia when looking upward.  No periorbital erythema or ecchymosis though right eye ptosis present.  No temporal artery tenderness    Left eye: No discharge.     Conjunctiva/sclera: Conjunctivae normal.  Cardiovascular:     Rate and Rhythm: Normal rate and regular rhythm.     Pulses: Normal pulses.     Heart sounds: Normal heart sounds Pulmonary:     Effort: Pulmonary effort is normal. No respiratory distress.     Breath sounds: Normal breath sounds.  Abdominal:     Abdomen is soft. There is no abdominal tenderness. No rebound or guarding. No distention. Musculoskeletal:        General: No swelling. Normal range of motion.     Cervical back: Normal range of motion and neck supple.  Lymphadenopathy:     Cervical: No cervical adenopathy.  Skin:    General: Skin is warm and dry.     Capillary Refill: Capillary refill takes less than 2 seconds.     Findings: No rash.  Neurological:     Mental Status: She is alert.   Neurological: GCS 15 alert and oriented x3 Normal speech, no expressive or receptive aphasia or dysarthria Cranial nerves II through XII intact. Right ptosis noted. Normal visual fields and equal bilaterally. Normal and painless EOMs. 5 out of 5  strength in all 4 extremities with intact sensation throughout No extremity drift Normal finger-to-nose testing, no limb or truncal ataxia   ED Results / Procedures / Treatments   Labs (all labs ordered are listed, but only abnormal results are displayed) Labs Reviewed  CBC WITH DIFFERENTIAL/PLATELET - Abnormal; Notable for the following components:      Result Value   RBC 5.48 (*)    Hemoglobin 16.4 (*)    HCT 49.8 (*)    All other components within normal limits  BASIC METABOLIC PANEL     EKG     RADIOLOGY I independently reviewed CT imaging and agree with radiologist findings.    PROCEDURES:  Critical Care performed:   Procedures   MEDICATIONS ORDERED IN ED: Medications  ketorolac (TORADOL) 15 MG/ML injection 15 mg (has no administration in time range)  proparacaine (ALCAINE) 0.5 % ophthalmic solution 1 drop (1 drop Right Eye Given 03/08/22 1201)  fluorescein ophthalmic strip 1 strip (1 strip Right Eye Given 03/08/22 1202)  iohexol (OMNIPAQUE) 350 MG/ML injection 75 mL (75 mLs Intravenous Contrast Given 03/08/22 1329)     IMPRESSION / MDM / ASSESSMENT AND PLAN / ED COURSE  I reviewed the triage vital signs and the nursing notes.   Differential diagnosis includes, but is not limited to, new mets, dissection, intracranial hemorrhage, temporal arteritis, myasthenia gravis, preseptal cellulitis, orbital cellulitis, Horner syndrome.  Patient presents emergency department awake and alert, hemodynamically stable and afebrile.  She has obvious ptosis of her right eye, though no appreciable myosis.  Her pupils are round and reactive.  She has full and painless extraocular movements, no periorbital edema or erythema.  No proptosis.  Her visual acuity is 20/40 on the right, 20/50 on the left.  Normal ocular pressure of 14, no fluorescein uptake.  CTA head and neck obtained as well as CT orbits.  CT orbit reveals asymmetric edema/stranding in the retrobulbar fat without  discrete drainable fluid collection.  This was discussed with Dr. George Ina with ophthalmology (while awaiting CTA head and neck) who recommends MRI orbit for evaluation of new ocular mets versus pseudotumor cerebri.  He also recommends treatment with prednisone taper, beginning with 60 mg for 4 to 5 days.  Patient was passed off to Dr. Cheri Fowler pending imaging and final disposition.  Clinical Course as of 03/08/22 [place] Mar 08, 2022  1543  Discussed with ophthalmology, Dr. George Ina who reports that this could be new orbital mets or idiopathic pseudotumor.  He recommends waiting for the results of the CTA head and neck, and if this is normal then consideration of MRI orbit and prednisone taper.  He would appreciate a call back after the MRI orbit [JP]  1551 Patient agrees to stay for further imaging. Requesting pain medications, toradol ordered given her relief with aleve at home [JP]    Clinical Course User Index [JP] Maleigha Colvard, Clarnce Flock, PA-C     FINAL CLINICAL IMPRESSION(S) / ED DIAGNOSES   Final diagnoses:  Ptosis of right eyelid     Rx / DC Orders   ED Discharge Orders     None        Note:  This document was prepared using Dragon voice recognition software and may include unintentional dictation errors.   Stacie Old, PA-C 03/08/22 1554    Stacie Plummer, MD 03/08/22 [phone]

## 2022-03-08 NOTE — ED Notes (Signed)
Patient transported to CT 

## 2022-03-08 NOTE — Discharge Instructions (Addendum)
-  Follow-up with the ophthalmologist within the next 2 days.  If he does not reach out to you by tomorrow, you may use the contact information listed above to call him.  -Take all of your steroids as prescribed.  -Return to the emergency department anytime if you begin to experience any new or worsening symptoms

## 2022-03-08 NOTE — ED Provider Notes (Signed)
Emergency department handoff note  Care of this patient was signed out to me at the end of the previous provider shift.  All pertinent patient information was conveyed and all questions were answered.  Patient pending MRI of the orbits that are still pending a read at the end of my shift.  I spoke to Dr. George Ina again in ophthalmology who recommends placing patient on steroid burst as well as following up in his office in the next few days unless the MRI comes back with actionable results. Care of this patient will be signed out to the oncoming provider at the end of my shift.  All pertinent patient information conveyed and all questions answered.  All further care and disposition decisions will be made by the oncoming physician.   Naaman Plummer, MD 03/08/22 [phone]

## 2022-03-08 NOTE — ED Notes (Signed)
Pt upset about her MRI wait. Pt stated she's hungry and thirsty.

## 2022-03-09 ENCOUNTER — Other Ambulatory Visit: Payer: Self-pay

## 2022-03-16 ENCOUNTER — Ambulatory Visit
Admission: RE | Admit: 2022-03-16 | Discharge: 2022-03-16 | Disposition: A | Discharge status: Home / Self Care | Payer: Medicaid Other | Source: Ambulatory Visit | Attending: Internal Medicine | Admitting: Internal Medicine

## 2022-03-16 DIAGNOSIS — C3411 Malignant neoplasm of upper lobe, right bronchus or lung: Secondary | ICD-10-CM | POA: Insufficient documentation

## 2022-03-16 MED ORDER — IOHEXOL 300 MG/ML  SOLN
100.0000 mL | Freq: Once | INTRAMUSCULAR | Status: AC | PRN
  Administered 2022-03-16: 100 mL via INTRAVENOUS

## 2022-03-22 ENCOUNTER — Encounter: Payer: Self-pay | Admitting: Internal Medicine

## 2022-03-22 ENCOUNTER — Inpatient Hospital Stay: Payer: Medicaid Other

## 2022-03-22 ENCOUNTER — Inpatient Hospital Stay: Payer: Medicaid Other | Attending: Radiation Oncology

## 2022-03-22 ENCOUNTER — Inpatient Hospital Stay (HOSPITAL_BASED_OUTPATIENT_CLINIC_OR_DEPARTMENT_OTHER): Payer: Medicaid Other | Admitting: Internal Medicine

## 2022-03-22 ENCOUNTER — Other Ambulatory Visit: Payer: Self-pay

## 2022-03-22 DIAGNOSIS — H209 Unspecified iridocyclitis: Secondary | ICD-10-CM | POA: Insufficient documentation

## 2022-03-22 DIAGNOSIS — Z5112 Encounter for antineoplastic immunotherapy: Secondary | ICD-10-CM | POA: Insufficient documentation

## 2022-03-22 DIAGNOSIS — K5900 Constipation, unspecified: Secondary | ICD-10-CM | POA: Insufficient documentation

## 2022-03-22 DIAGNOSIS — Z7984 Long term (current) use of oral hypoglycemic drugs: Secondary | ICD-10-CM | POA: Insufficient documentation

## 2022-03-22 DIAGNOSIS — F17210 Nicotine dependence, cigarettes, uncomplicated: Secondary | ICD-10-CM | POA: Insufficient documentation

## 2022-03-22 DIAGNOSIS — C3411 Malignant neoplasm of upper lobe, right bronchus or lung: Secondary | ICD-10-CM

## 2022-03-22 DIAGNOSIS — C787 Secondary malignant neoplasm of liver and intrahepatic bile duct: Secondary | ICD-10-CM | POA: Diagnosis not present

## 2022-03-22 DIAGNOSIS — Z809 Family history of malignant neoplasm, unspecified: Secondary | ICD-10-CM | POA: Insufficient documentation

## 2022-03-22 DIAGNOSIS — E876 Hypokalemia: Secondary | ICD-10-CM | POA: Diagnosis not present

## 2022-03-22 DIAGNOSIS — E119 Type 2 diabetes mellitus without complications: Secondary | ICD-10-CM | POA: Insufficient documentation

## 2022-03-22 DIAGNOSIS — Z79899 Other long term (current) drug therapy: Secondary | ICD-10-CM | POA: Insufficient documentation

## 2022-03-22 DIAGNOSIS — H5789 Other specified disorders of eye and adnexa: Secondary | ICD-10-CM | POA: Insufficient documentation

## 2022-03-22 DIAGNOSIS — C7951 Secondary malignant neoplasm of bone: Secondary | ICD-10-CM | POA: Diagnosis not present

## 2022-03-22 DIAGNOSIS — J432 Centrilobular emphysema: Secondary | ICD-10-CM | POA: Insufficient documentation

## 2022-03-22 LAB — CBC WITH DIFFERENTIAL/PLATELET
Abs Immature Granulocytes: 0.01 10*3/uL (ref 0.00–0.07)
Basophils Absolute: 0.1 10*3/uL (ref 0.0–0.1)
Basophils Relative: 1 %
Eosinophils Absolute: 0.1 10*3/uL (ref 0.0–0.5)
Eosinophils Relative: 2 %
HCT: 43.7 % (ref 36.0–46.0)
Hemoglobin: 14.6 g/dL (ref 12.0–15.0)
Immature Granulocytes: 0 %
Lymphocytes Relative: 47 %
Lymphs Abs: 3.3 10*3/uL (ref 0.7–4.0)
MCH: 29.9 pg (ref 26.0–34.0)
MCHC: 33.4 g/dL (ref 30.0–36.0)
MCV: 89.4 fL (ref 80.0–100.0)
Monocytes Absolute: 0.6 10*3/uL (ref 0.1–1.0)
Monocytes Relative: 9 %
Neutro Abs: 2.9 10*3/uL (ref 1.7–7.7)
Neutrophils Relative %: 41 %
Platelets: 299 10*3/uL (ref 150–400)
RBC: 4.89 MIL/uL (ref 3.87–5.11)
RDW: 13.7 % (ref 11.5–15.5)
WBC: 7 10*3/uL (ref 4.0–10.5)
nRBC: 0 % (ref 0.0–0.2)

## 2022-03-22 LAB — COMPREHENSIVE METABOLIC PANEL
ALT: 8 U/L (ref 0–44)
AST: 14 U/L — ABNORMAL LOW (ref 15–41)
Albumin: 3.6 g/dL (ref 3.5–5.0)
Alkaline Phosphatase: 89 U/L (ref 38–126)
Anion gap: 7 (ref 5–15)
BUN: 16 mg/dL (ref 6–20)
CO2: 25 mmol/L (ref 22–32)
Calcium: 9 mg/dL (ref 8.9–10.3)
Chloride: 104 mmol/L (ref 98–111)
Creatinine, Ser: 0.68 mg/dL (ref 0.44–1.00)
GFR, Estimated: >60 mL/min (ref >60)
Glucose, Bld: 88 mg/dL (ref 70–99)
Potassium: 3.6 mmol/L (ref 3.5–5.1)
Sodium: 136 mmol/L (ref 135–145)
Total Bilirubin: 0.3 mg/dL (ref 0.3–1.2)
Total Protein: 7.4 g/dL (ref 6.5–8.1)

## 2022-03-22 LAB — TSH: TSH: 4.544 u[IU]/mL — ABNORMAL HIGH (ref 0.350–4.500)

## 2022-03-22 MED ORDER — SODIUM CHLORIDE 0.9 % IV SOLN
Freq: Once | INTRAVENOUS | Status: AC
  Filled 2022-03-22: qty 250

## 2022-03-22 MED ORDER — MORPHINE SULFATE ER 15 MG PO TBCR
15.0000 mg | EXTENDED_RELEASE_TABLET | Freq: Two times a day (BID) | ORAL | 0 refills | Status: DC
Start: 2022-03-22 — End: 2022-04-16
  Filled 2022-03-22: qty 60, 30d supply, fill #0

## 2022-03-22 MED ORDER — SODIUM CHLORIDE 0.9 % IV SOLN
1200.0000 mg | Freq: Once | INTRAVENOUS | Status: AC
  Administered 2022-03-22: 1200 mg via INTRAVENOUS
  Filled 2022-03-22: qty 20

## 2022-03-22 MED ORDER — OXYCODONE HCL 10 MG PO TABS
10.0000 mg | ORAL_TABLET | Freq: Two times a day (BID) | ORAL | 0 refills | Status: DC | PRN
  Filled 2022-03-22: qty 60, 30d supply, fill #0

## 2022-03-22 MED ORDER — HEPARIN SOD (PORK) LOCK FLUSH 100 UNIT/ML IV SOLN
500.0000 [IU] | Freq: Once | INTRAVENOUS | Status: AC | PRN
  Administered 2022-03-22: 500 [IU]
  Filled 2022-03-22: qty 5

## 2022-03-22 NOTE — Progress Notes (Signed)
Day after last treatment she woke the next morning with right eye pain with swelling and had to go to ER.    Right arm pain 8/10 today (not new).  Pain medications pended for MD review.  Reports stable appetite with a 6 lb wt loss since 03/08/22.

## 2022-03-22 NOTE — Progress Notes (Signed)
[facility] CONSULT NOTE  Patient Care Team: Cammie Sickle, MD as PCP - General (Internal Medicine) Telford Nab, RN as Oncology Nurse Navigator  CHIEF COMPLAINTS/PURPOSE OF CONSULTATION: lung cancer  #  Oncology History Overview Note  # NOV -[id number] [facility] CANCER SCREENING PROGRAM]-18 mm right upper lobe lung nodule; DEC 2021- s/p right upper lobectomy [Dr. Roxan Hockey; [place]]; STAGE: I [pT-18 mm; LN-12=0]; predominant large cell neuroendocrine; minor adenocarcinoma.  Declines adjuvant chemotherapy.  #Recurrent/stage IV cancer NOV 2022- PET scan-scapular lesion liver lesion gastrohepatic lymphadenopathy.  11/21- start RT to right scapular lesion  # DEC 3rd, 2022- CARBO=ETOP+TECEN; udenyca #1.   # # MAY 2023- Right swollen eye/orbital inflammation/uveitis s/p Zometa status post steroids improved.-reviewed literature case reports noted; less concerning for tumor involvement.  DISCONTINUE ZOMETA.  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  Visceral Pleura Invasion: Not identified  Direct Invasion of Adjacent Structures: No adjacent structures present  Lymphovascular Invasion: Not identified  Margins: All margins negative for invasive carcinoma       Closest Margin(s) to Invasive Carcinoma: Bronchovascular margin  Treatment Effect: No known presurgical therapy  Regional Lymph Nodes:       Number of Lymph Nodes Involved: 0                            Nodal Sites with Tumor: Not applicable       Number of Lymph Nodes Examined: 12      Primary cancer of right upper lobe of lung ([place])  11/03/2020 Initial Diagnosis   Primary cancer  of right upper lobe of lung ([place])   11/03/2020 Cancer Staging   Staging form: Lung, AJCC 8th Edition - Pathologic: Stage IA3 (pT1c, pN0, cM0) - Signed by Cammie Sickle, MD on 11/04/2020   09/14/2021 -  Chemotherapy   Patient is on Treatment Plan : LUNG SCLC Carboplatin + Etoposide + Atezolizumab Induction q21d / Atezolizumab Maintenance q21d     11/09/2021 Cancer Staging   Staging form: Lung, AJCC 8th Edition - Pathologic: Stage IVB (pTX, pNX, cM1c) - Signed by Cammie Sickle, MD on 11/09/2021     HISTORY OF PRESENTING ILLNESS: Ambulating independently.  Alone.   Stacie Kennedy 61 y.o.  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  After the last infusion of Atzeo; zometa- Complains of Right eye swelling/pain; head ache. S/p evaluation with MRI- postseptal cellulitis. S/p steroids improved.   Patient denies any worsening pain in the right shoulder.  Patient states to be on MS Contin  BID;  oxycodone up 1 to 2 a day.  No nausea no vomiting.  No headaches. Chronic shortness of breath-not any worse.   Review of Systems  Constitutional:  Negative for chills, diaphoresis, fever, malaise/fatigue and weight loss.  HENT:  Negative for nosebleeds and sore throat.   Eyes:  Negative for double vision.  Respiratory:  Negative for cough, hemoptysis, sputum production, shortness of breath and wheezing.   Cardiovascular:  Negative for  chest pain, palpitations, orthopnea and leg swelling.  Gastrointestinal:  Negative for abdominal pain, blood in stool, constipation, diarrhea, heartburn, melena, nausea and vomiting.  Genitourinary:  Negative for dysuria, frequency and urgency.  Musculoskeletal:  Positive for back pain, joint pain and myalgias.  Skin: Negative.  Negative for itching and rash.  Neurological:  Negative for dizziness,  tingling, focal weakness, weakness and headaches.  Endo/Heme/Allergies:  Does not bruise/bleed easily.  Psychiatric/Behavioral:  Negative for depression. The patient is not nervous/anxious and does not have insomnia.      MEDICAL HISTORY:  Past Medical History:  Diagnosis Date  . Cancer ([place])    lung cancer  . Depression   . Duodenitis   . Dyspnea   . Family history of cancer   . High cholesterol   . Personal history of colonic polyps   . Pre-diabetes   . Umbilical hernia   . UTI (urinary tract infection)     SURGICAL HISTORY: Past Surgical History:  Procedure Laterality Date  . COLONOSCOPY WITH PROPOFOL N/A 03/24/2021   Procedure: COLONOSCOPY WITH PROPOFOL;  Surgeon: Jonathon Bellows, MD;  Location: [facility] ENDOSCOPY;  Service: Gastroenterology;  Laterality: N/A;  . INTERCOSTAL NERVE BLOCK  09/19/2020   Procedure: INTERCOSTAL NERVE BLOCK;  Surgeon: Melrose Nakayama, MD;  Location: [place];  Service: Thoracic;;  . IR IMAGING GUIDED PORT INSERTION  09/23/2021  . LUNG LOBECTOMY Right   . LUNG REMOVAL, PARTIAL Right 09/19/2020  . NODE DISSECTION Right 09/19/2020   Procedure: NODE DISSECTION;  Surgeon: Melrose Nakayama, MD;  Location: [place];  Service: Thoracic;  Laterality: Right;  Marland Kitchen VIDEO BRONCHOSCOPY N/A 07/08/2021   Procedure: VIDEO BRONCHOSCOPY;  Surgeon: Melrose Nakayama, MD;  [facility];  Service: Thoracic;  Laterality: N/A;    SOCIAL HISTORY: Social History   Socioeconomic History  . Marital status: Single    Spouse name: Not on file  . Number of children: Not on file  . Years of education: Not on file  . Highest education level: Not on file  Occupational History  . Not on file  Tobacco Use  . Smoking status: Some Days    Packs/day: 0.25    Years: 43.00    Total pack years: 10.75    Types: Cigarettes  . Smokeless tobacco: Never  . Tobacco comments:    states she is slowing, trying to quit  Vaping Use  . Vaping Use: Never used  Substance and Sexual  Activity  . Alcohol use: Yes    Alcohol/week: 2.0 standard drinks of alcohol    Types: 2 Cans of beer per week    Comment: occasional  . Drug use: No  . Sexual activity: Not on file  Other Topics Concern  . Not on file  Social History Narrative   Lives in [place]; smokes; now and then beer; with boy friend. 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    Social Determinants of Health   Financial Resource Strain: High Risk (06/25/2020)   Overall Financial Resource Strain (CARDIA)   . Difficulty of Paying Living Expenses: Hard  Food Insecurity: Food Insecurity Present (07/09/2021)   Hunger Vital Sign   . Worried About Charity fundraiser in the Last Year: Sometimes true   . Ran Out of Food in the Last Year: Sometimes true  Transportation Needs: No Transportation Needs (07/09/2021)   PRAPARE - Transportation   . Lack of Transportation (Medical): No   . Lack of Transportation (Non-Medical): No  Physical Activity: Insufficiently Active (  04/16/2021)   Exercise Vital Sign   . Days of Exercise per Week: 7 days   . Minutes of Exercise per Session: 20 min  Stress: Stress Concern Present (04/16/2021)   [place]   . Feeling of Stress : To some extent  Social Connections: Socially Isolated (07/09/2021)   Social Connection and Isolation Panel [NHANES]   . Frequency of Communication with Friends and Family: Never   . Frequency of Social Gatherings with Friends and Family: Never   . Attends Religious Services: Never   . Active Member of Clubs or Organizations: No   . Attends Archivist Meetings: Never   . Marital Status: Never married  Intimate Partner Violence: Not At Risk (06/25/2020)   Humiliation, Afraid, Rape, and Kick questionnaire   . Fear of Current or Ex-Partner: No   . Emotionally Abused: No   . Physically Abused: No   . Sexually Abused: No    FAMILY HISTORY: Family History  Problem Relation Age  of Onset  . Diabetes Mother   . Cancer Mother   . Diabetes Father   . Stroke Father   . Heart attack Father        65s  . Healthy Sister   . Cancer Brother   . Hepatitis Brother   . Diabetes Brother   . Hypertension Brother   . Heart disease Brother     ALLERGIES:  has No Known Allergies.  MEDICATIONS:  Current Outpatient Medications  Medication Sig Dispense Refill  . albuterol (PROVENTIL HFA) 108 (90 Base) MCG/ACT inhaler Inhale 2 puffs into the lungs once every 6 (six) hours as needed for wheezing or shortness of breath. 6.7 g 2  . lidocaine-prilocaine (EMLA) cream Apply one application topically the the affected area(s) daily as needed. 30 g 3  . loratadine (CLARITIN) 10 MG tablet Take 1 tablet (10 mg total) by mouth daily. 30 tablet 2  . metFORMIN (GLUCOPHAGE) 500 MG tablet TAKE ONE TABLET BY MOUTH EVERY MORNING WITH BREAKFAST 90 tablet 3  . aspirin EC 81 MG tablet Take 1 tablet (81 mg total) by mouth daily. Swallow whole. (Patient not taking: Reported on 12/21/2021) 30 tablet 11  . buPROPion (WELLBUTRIN SR) 150 MG 12 hr tablet Take 1 tablet (150 mg total) by mouth 2 (two) times daily. (Patient not taking: Reported on 10/13/2021) 60 tablet 0  . morphine (MS CONTIN) 15 MG 12 hr tablet Take 1 tablet (15 mg total) by mouth every 12 (twelve) hours. 60 tablet 0  . ondansetron (ZOFRAN) 4 MG tablet TAKE 2 TABLETS (8MG) BY MOUTH ONCE EVERY 8 HOURS AS NEEDED FOR NAUSEA OR VOMITING. (Patient not taking: Reported on 11/09/2021) 80 tablet 1  . Oxycodone HCl 10 MG TABS Take 1 tablet (10 mg total) by mouth 2 (two) times daily as needed (pain). 60 tablet 0  . predniSONE (DELTASONE) 10 MG tablet Take 6 tabs on day 1, 5 tabs on day 2, 4 tabs on day 3, 3 tabs on day 4, 2 tabs on day 5, 1 tab on day 6. Then stop. (Patient not taking: Reported on 03/22/2022) 21 tablet 0  . prochlorperazine (COMPAZINE) 10 MG tablet Take 1 tablet (10 mg total) by mouth once every 6 (six) hours as needed for nausea or  vomiting. (Patient not taking: Reported on 11/09/2021) 40 tablet 1  . rosuvastatin (CRESTOR) 10 MG tablet TAKE ONE TABLET BY MOUTH EVERY DAY (Patient not taking: Reported on 10/13/2021) 30 tablet  3   No current facility-administered medications for this visit.   Facility-Administered Medications Ordered in Other Visits  Medication Dose Route Frequency Provider Last Rate Last Admin  . heparin lock flush 100 unit/mL  500 Units Intravenous Once Charlaine Dalton R, MD      . sodium chloride flush (NS) 0.9 % injection 10 mL  10 mL Intravenous PRN Cammie Sickle, MD   10 mL at 03/01/22 0857      .  PHYSICAL EXAMINATION: ECOG PERFORMANCE STATUS: 0 - Asymptomatic  Vitals:   03/22/22 1000  BP: 118/71  Pulse: 71  Resp: 18  Temp: 97.7 F (36.5 C)       Filed Weights   03/22/22 1000  Weight: 148 lb 9.6 oz (67.4 kg)        Physical Exam HENT:     Head: Normocephalic and atraumatic.     Mouth/Throat:     Pharynx: No oropharyngeal exudate.  Eyes:     Pupils: Pupils are equal, round, and reactive to light.  Cardiovascular:     Rate and Rhythm: Normal rate and regular rhythm.  Pulmonary:     Effort: Pulmonary effort is normal. No respiratory distress.     Breath sounds: Normal breath sounds. No wheezing.  Abdominal:     General: Bowel sounds are normal. There is no distension.     Palpations: Abdomen is soft. There is no mass.     Tenderness: There is no abdominal tenderness. There is no guarding or rebound.  Musculoskeletal:        General: No tenderness. Normal range of motion.     Cervical back: Normal range of motion and neck supple.  Skin:    General: Skin is warm.  Neurological:     Mental Status: She is alert and oriented to person, place, and time.  Psychiatric:        Mood and Affect: Affect normal.     LABORATORY DATA:  I have reviewed the data as listed Lab Results  Component Value Date   WBC 7.0 03/22/2022   HGB 14.6 03/22/2022   HCT 43.7  03/22/2022   MCV 89.4 03/22/2022   PLT 299 03/22/2022   Recent Labs    02/08/22 0856 03/01/22 0846 03/08/22 1159 03/22/22 0856  NA 137 138 138 136  K 3.7 3.6 3.7 3.6  CL 104 109 100 104  CO2 _0 GLUCOSE 93 110* 99 88  BUN _1 CREATININE 0.57 0.51 0.48 0.68  CALCIUM 9.2 8.3* 9.4 9.0  GFRNONAA >60 >60 >60 >60  PROT 7.3 6.8  --  7.4  ALBUMIN 3.9 3.4*  --  3.6  AST 16 16  --  14*  ALT 8 8  --  8  ALKPHOS 92 87  --  89  BILITOT 0.3 0.3  --  0.3    RADIOGRAPHIC STUDIES: I have personally reviewed the radiological images as listed and agreed with the findings in the report. CT CHEST ABDOMEN PELVIS W CONTRAST  Result Date: 03/17/2022 CLINICAL DATA:  Restaging metastatic non-small cell lung cancer. * Tracking Code: BO * EXAM: CT CHEST, ABDOMEN, AND PELVIS WITH CONTRAST TECHNIQUE: Multidetector CT imaging of the chest, abdomen and pelvis was performed following the standard protocol during bolus administration of intravenous contrast. RADIATION DOSE REDUCTION: This exam was performed according to the departmental dose-optimization program which includes automated exposure control, adjustment of the mA and/or kV according to patient size and/or use of iterative  reconstruction technique. CONTRAST:  129m OMNIPAQUE IOHEXOL 300 MG/ML  SOLN COMPARISON:  11/27/2021 FINDINGS: CT CHEST FINDINGS Cardiovascular: Right Port-A-Cath tip: SVC. Coronary, aortic arch, and branch vessel atherosclerotic vascular disease. Mediastinum/Nodes: No pathologic adenopathy. Lungs/Pleura: Right upper lobectomy. Centrilobular emphysema. Mostly atelectatic right middle lobe similar to previous. Peripheral scarring in the right lower lobe is not substantially changed. Stable mild lingular scarring. Musculoskeletal: Progressive sclerosis/mineralization of the right scapular metastatic lesion on image 14 of series 2 likely representing healing response. CT ABDOMEN PELVIS FINDINGS Hepatobiliary: The metastatic  lesion posteriorly in the left hepatic lobe demonstrates reduced enhancement compared to previous and currently measures 3.1 by 2.5 cm, formerly 5.4 by 4.5 cm. There is also reduced enhancement of the anterior left hepatic lobe metastatic lesion, which measures 3.1 by 2.2 cm (formerly 3.1 by 2.5 cm). No new liver lesion observed. Hyperdensity in the left hepatic lobe on portal venous phase images but not delayed phase images is probably due to mass effect of the posterior left hepatic lobe lesion on the left hepatic vein. Gallbladder unremarkable. Pancreas: Unremarkable Spleen: Unremarkable Adrenals/Urinary Tract: Stable low-density left adrenal mass compatible with adenoma, 2.8 by 2.1 cm. Otherwise unremarkable. Stomach/Bowel: Unremarkable Vascular/Lymphatic: Atherosclerosis is present, including aortoiliac atherosclerotic disease. Mural thrombus in the lower abdominal aorta which measures 2.5 cm transverse on image 74 series 2. No pathologic adenopathy observed. Reproductive: Uterine fibroids noted. Other: No supplemental non-categorized findings. Musculoskeletal: Unremarkable IMPRESSION: 1. Reduced size and enhancement of hepatic metastatic lesions. 2. Increase mineralization of the right scapular metastatic lesion compatible with healing response. 3. Other imaging findings of potential clinical significance: Aortic Atherosclerosis (ICD10-I70.0) and Emphysema (ICD10-J43.9). Coronary atherosclerosis. Right upper lobectomy. Left adrenal adenoma. Uterine fibroids. Electronically Signed   By: WVan ClinesM.D.   On: 03/17/2022 16:57   MR ORBITS W WO CONTRAST  Result Date: 03/08/2022 EXAM: MRI OF THE ORBITS WITHOUT AND WITH CONTRAST TECHNIQUE: Multiplanar, multi-echo pulse sequences of the orbits and surrounding structures were acquired including fat saturation techniques, before and after intravenous contrast administration. CONTRAST:  754mGADAVIST GADOBUTROL 1 MMOL/ML IV SOLN COMPARISON:  CT of the orbits  from the same day. FINDINGS: Orbits: Edema and enhancement in the right retro bulbar fat and surrounding the right optic nerve. No discrete enhancing mass. No discrete fluid collection. The cavernous sinuses appear symmetrically enhancing and there is no evidence of dilated or occluded superior ophthalmic vein. The extraocular muscles and lacrimal glands are unremarkable. Visualized sinuses: Moderate left maxillary sinus mucosal thickening. Minimal ethmoid air cell mucosal thickening. Soft tissues: Outside of the right orbit, unremarkable visualized soft tissues. Limited intracranial: No acute or significant finding. Remote right cerebellar lacunar infarcts. IMPRESSION: Extensive edema and enhancement in the right retrobulbar fat. Primary differential considerations include postseptal cellulitis and idiopathic orbital inflammatory disease (pseudotumor). No discrete fluid collection. No discrete enhancing mass, but given the patient's known malignancy infiltrating metastasis is a differential consideration. Electronically Signed   By: FrMargaretha Sheffield.D.   On: 03/08/2022 19:37   CT ANGIO HEAD NECK W WO CM  Result Date: 03/08/2022 CLINICAL DATA:  Head trauma, focal neuro findings (Age [age]yh/o cancer on chemo, new ptosis EXAM: CT ANGIOGRAPHY HEAD AND NECK TECHNIQUE: Multidetector CT imaging of the head and neck was performed using the standard protocol during bolus administration of intravenous contrast. Multiplanar CT image reconstructions and MIPs were obtained to evaluate the vascular anatomy. Carotid stenosis measurements (when applicable) are obtained utilizing NASCET criteria, using the distal internal carotid diameter as the denominator.  RADIATION DOSE REDUCTION: This exam was performed according to the departmental dose-optimization program which includes automated exposure control, adjustment of the mA and/or kV according to patient size and/or use of iterative reconstruction technique. CONTRAST:   16m OMNIPAQUE IOHEXOL 350 MG/ML SOLN COMPARISON:  None Available. FINDINGS: CT HEAD FINDINGS Brain: No evidence of acute infarction, hemorrhage, hydrocephalus, extra-axial collection or mass lesion/mass effect. Vascular:   No hyperdense vessel identified. Skull: No acute fracture. Sinuses: Mild paranasal sinus mucosal thickening. Orbits: See same day CT of the orbits for evaluation orbits. Review of the MIP images confirms the above findings CTA NECK FINDINGS Aortic arch: Great vessel origins are patent. Right carotid system: Moderate atherosclerosis at the carotid bifurcation without greater than 50% stenosis. Left carotid system: No significant (greater than 50%) stenosis. Mild atherosclerosis at the carotid bifurcation. Vertebral arteries: Left dominant. No evidence of dissection, stenosis (50% or greater) or occlusion. Skeleton: Degenerative change. Other neck: Heterogeneous enlarged thyroid no acute abnormality. Upper chest: Visualized lung apices are clear.  Emphysema. Review of the MIP images confirms the above findings CTA HEAD FINDINGS Anterior circulation: Bilateral intracranial ICAs are mildly narrowed by calcific atherosclerosis. Bilateral MCAs and ACAs are patent without proximal hemodynamically significant stenosis. Posterior circulation: Bilateral intradural vertebral arteries, basilar artery and both posterior cerebral arteries are patent without proximal hemodynamically significant stenosis. Small vertebrobasilar system with fetal type PCAs bilaterally, anatomic variant. Venous sinuses: Not well evaluated due to arterial timing. Anatomic variants: See above. Review of the MIP images confirms the above findings IMPRESSION: 1. No emergent large vessel occlusion or proximal hemodynamically significant stenosis in the head or neck. 2. Heterogeneous and enlarged thyroid gland. Recommend thyroid [place](ref: J Am Coll Radiol. 2015 Feb;12(2): 143-50). 3. Emphysema (ICD10-J43.9). Electronically Signed   By:  FMargaretha SheffieldM.D.   On: 03/08/2022 16:45   CT Orbits Wo Contrast  Result Date: 03/08/2022 CLINICAL DATA:  Ocular pain EXAM: CT ORBITS WITHOUT CONTRAST TECHNIQUE: Multidetector CT imaging of the orbits was performed using the standard protocol without intravenous contrast. Multiplanar CT image reconstructions were also generated. RADIATION DOSE REDUCTION: This exam was performed according to the departmental dose-optimization program which includes automated exposure control, adjustment of the mA and/or kV according to patient size and/or use of iterative reconstruction technique. COMPARISON:  None Available. FINDINGS: Orbits: Asymmetric edema/stranding in the retrobulbar fat on the right. The globes, extraocular muscles and lacrimal glands are within normal limits and symmetric. No proptosis. No discrete, drainable fluid collection. There is stranding around the right optic nerve, but the optic nerve itself appears within normal limits by CT. Visible paranasal sinuses: Moderate left maxillary sinus mucosal thickening. Soft tissues: Right retro bulbar fat stranding/edema as detailed above. Otherwise, unremarkable soft tissues. Osseous: No fracture or aggressive lesion. Limited intracranial: No acute or significant finding. IMPRESSION: Asymmetric edema/stranding in the retrobulbar fat on the right, nonspecific and concerning for an infectious or inflammatory process. No discrete, drainable fluid collection. Recommend ophthalmology consultation. Electronically Signed   By: FMargaretha SheffieldM.D.   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 s/p carbo etoposide-Tecentriq. JUNE 7th, 2023- CT cap-  Reduced size and enhancement of hepatic metastatic lesions;Increase mineralization of the right scapular metastatic  lesion compatible with healing response.   # proceed with Tecentriq #4 maintenance today. Labs today reviewed;  acceptable for treatment today.   #  mets to bone/ Pain right shoulder- currently s/p RT [ 09/16/2021]; continue with MS contin BID: and oxycodone 10 mg prn [?1-2/day] prn. S/p  Zometa- on 5/22- DISCONTINUE sec to orbital inflammation-  see below   # MAY 2023- Right swollen eye/orbital inflammation/uveitis s/p Zometa status post steroids improved.-reviewed literature case reports noted; less concerning for tumor involvement.  DISCONTINUE ZOMETA. Consider X-geva.   # Hypokalemia: K- 3.2; discussed re: dietary supp-STABLE.  # constipation:  Miralax BID; fluids; Dulcolax- STABLE.  # DM:  On Metformin. STABLE.  # COPD: refilled albuterol;STABLE.  #IV access: functional  TSH pending-  # DISPOSITION: # Tecentriq today # follow up in 3 weeks- MD- port lab- cbc/cmp; tecentriq; ; -Dr.B  # I reviewed the blood work- with the patient in detail; also reviewed the imaging independently [as summarized above]; and with the patient in detail.     All questions were answered. The patient knows to call the clinic with any problems, questions or concerns.    Cammie Sickle, MD 03/22/2022 2:13 PM

## 2022-03-22 NOTE — Patient Instructions (Signed)
[facility] CANCER CTR AT [place]  Discharge Instructions: Thank you for choosing [facility] to provide your oncology and hematology care.  If you have a lab appointment with the [facility], please go directly to the [facility] and check in at the registration area.  Wear comfortable clothing and clothing appropriate for easy access to any Portacath or PICC line.   We strive to give you quality time with your provider. You may need to reschedule your appointment if you arrive late (15 or more minutes).  Arriving late affects you and other patients whose appointments are after yours.  Also, if you miss three or more appointments without notifying the office, you may be dismissed from the clinic at the provider's discretion.      For prescription refill requests, have your pharmacy contact our office and allow 72 hours for refills to be completed.    Today you received the following chemotherapy and/or immunotherapy agents TECENTRIQ      To help prevent nausea and vomiting after your treatment, we encourage you to take your nausea medication as directed.  BELOW ARE SYMPTOMS THAT SHOULD BE REPORTED IMMEDIATELY: *FEVER GREATER THAN 100.4 F (38 C) OR HIGHER *CHILLS OR SWEATING *NAUSEA AND VOMITING THAT IS NOT CONTROLLED WITH YOUR NAUSEA MEDICATION *UNUSUAL SHORTNESS OF BREATH *UNUSUAL BRUISING OR BLEEDING *URINARY PROBLEMS (pain or burning when urinating, or frequent urination) *BOWEL PROBLEMS (unusual diarrhea, constipation, pain near the anus) TENDERNESS IN MOUTH AND THROAT WITH OR WITHOUT PRESENCE OF ULCERS (sore throat, sores in mouth, or a toothache) UNUSUAL RASH, SWELLING OR PAIN  UNUSUAL VAGINAL DISCHARGE OR ITCHING   Items with * indicate a potential emergency and should be followed up as soon as possible or go to the Emergency Department if any problems should occur.  Please show the CHEMOTHERAPY ALERT CARD or IMMUNOTHERAPY ALERT CARD at check-in to  the Emergency Department and triage nurse.  Should you have questions after your visit or need to cancel or reschedule your appointment, please contact [facility] CANCER [place] AT [place]  [phone] and follow the prompts.  Office hours are 8:00 a.m. to 4:30 p.m. Monday - Friday. Please note that voicemails left after 4:00 p.m. may not be returned until the following business day.  We are closed weekends and major holidays. You have access to a nurse at all times for urgent questions. Please call the main number to the clinic [phone] and follow the prompts.  For any non-urgent questions, you may also contact your provider using MyChart. We now offer e-Visits for anyone 57 and older to request care online for non-urgent symptoms. For details visit [email or web address].   Also download the MyChart app! Go to the app store, search "MyChart", open the app, select [facility], and log in with your MyChart username and password.  Due to Covid, a mask is required upon entering the hospital/clinic. If you do not have a mask, one will be given to you upon arrival. For doctor visits, patients may have 1 support person aged 51 or older with them. For treatment visits, patients cannot have anyone with them due to current Covid guidelines and our immunocompromised population.   Atezolizumab injection What is this medication? ATEZOLIZUMAB (a te zoe LIZ ue mab) is a monoclonal antibody. It is used to treat bladder cancer (urothelial cancer), liver cancer, lung cancer, and melanoma. This medicine may be used for other purposes; ask your health care provider or pharmacist if you have questions. COMMON BRAND NAME(S): [employer]  What should I tell my care team before I take this medication? They need to know if you have any of these conditions: autoimmune diseases like Crohn's disease, ulcerative colitis, or lupus have had or planning to have an allogeneic stem cell transplant (uses someone else's  stem cells) history of organ transplant history of radiation to the chest nervous system problems like myasthenia gravis or Guillain-Barre syndrome an unusual or allergic reaction to atezolizumab, other medicines, foods, dyes, or preservatives pregnant or trying to get pregnant breast-feeding How should I use this medication? This medicine is for infusion into a vein. It is given by a health care professional in a hospital or clinic setting. A special MedGuide will be given to you before each treatment. Be sure to read this information carefully each time. Talk to your pediatrician regarding the use of this medicine in children. Special care may be needed. Overdosage: If you think you have taken too much of this medicine contact a poison control center or emergency room at once. NOTE: This medicine is only for you. Do not share this medicine with others. What if I miss a dose? It is important not to miss your dose. Call your doctor or health care professional if you are unable to keep an appointment. What may interact with this medication? Interactions have not been studied. This list may not describe all possible interactions. Give your health care provider a list of all the medicines, herbs, non-prescription drugs, or dietary supplements you use. Also tell them if you smoke, drink alcohol, or use illegal drugs. Some items may interact with your medicine. What should I watch for while using this medication? Your condition will be monitored carefully while you are receiving this medicine. You may need blood work done while you are taking this medicine. Do not become pregnant while taking this medicine or for at least 5 months after stopping it. Women should inform their doctor if they wish to become pregnant or think they might be pregnant. There is a potential for serious side effects to an unborn child. Talk to your health care professional or pharmacist for more information. Do not  breast-feed an infant while taking this medicine or for at least 5 months after the last dose. What side effects may I notice from receiving this medication? Side effects that you should report to your doctor or health care professional as soon as possible: allergic reactions like skin rash, itching or hives, swelling of the face, lips, or tongue black, tarry stools bloody or watery diarrhea breathing problems changes in vision chest pain or chest tightness chills facial flushing fever headache signs and symptoms of high blood sugar such as dizziness; dry mouth; dry skin; fruity breath; nausea; stomach pain; increased hunger or thirst; increased urination signs and symptoms of liver injury like dark yellow or brown urine; general ill feeling or flu-like symptoms; light-colored stools; loss of appetite; nausea; right upper belly pain; unusually weak or tired; yellowing of the eyes or skin stomach pain trouble passing urine or change in the amount of urine Side effects that usually do not require medical attention (report to your doctor or health care professional if they continue or are bothersome): bone pain cough diarrhea joint pain muscle pain muscle weakness swelling of arms or legs tiredness weight loss This list may not describe all possible side effects. Call your doctor for medical advice about side effects. You may report side effects to FDA at 1-800-FDA-1088. Where should I keep my medication? This drug  is given in a hospital or clinic and will not be stored at home. NOTE: This sheet is a summary. It may not cover all possible information. If you have questions about this medicine, talk to your doctor, pharmacist, or health care provider.  2023 Elsevier/Gold Standard (2021-08-28 00:00:00)

## 2022-03-22 NOTE — Assessment & Plan Note (Addendum)
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 s/p carbo etoposide-Tecentriq. JUNE 7th, 2023- CT cap-  Reduced size and enhancement of hepatic metastatic lesions;Increase mineralization of the right scapular metastatic lesion compatible with healing response.  # proceed with Tecentriq #4 maintenance today. Labs today reviewed;  acceptable for treatment today.   # mets to bone/ Pain right shoulder- currently s/p RT [ 09/16/2021]; continue with MS contin BID: and oxycodone 10 mg prn [?1-2/day] prn. S/p  Zometa- on 5/22- DISCONTINUE sec to orbital inflammation-  see below   # MAY 2023- Right swollen eye/orbital inflammation/uveitis s/p Zometa status post steroids improved.-reviewed literature case reports noted; less concerning for tumor involvement.  DISCONTINUE ZOMETA. Consider X-geva.   # Hypokalemia: K- 3.2; discussed re: dietary supp-STABLE.  # constipation:  Miralax BID; fluids; Dulcolax- STABLE.  # DM:  On Metformin. STABLE.  # COPD: refilled albuterol;STABLE.  #IV access: functional  TSH pending-  # DISPOSITION: # Tecentriq today # follow up in 3 weeks- MD- port lab- cbc/cmp; tecentriq; ; -Dr.B  # I reviewed the blood work- with the patient in detail; also reviewed the imaging independently [as summarized above]; and with the patient in detail.

## 2022-03-24 ENCOUNTER — Encounter: Payer: Self-pay | Admitting: Internal Medicine

## 2022-03-24 ENCOUNTER — Other Ambulatory Visit: Payer: Self-pay

## 2022-03-29 ENCOUNTER — Other Ambulatory Visit: Payer: Self-pay

## 2022-03-29 ENCOUNTER — Encounter: Payer: Self-pay | Admitting: Internal Medicine

## 2022-04-12 ENCOUNTER — Inpatient Hospital Stay: Payer: Medicaid Other | Admitting: Internal Medicine

## 2022-04-12 ENCOUNTER — Inpatient Hospital Stay: Payer: Medicaid Other

## 2022-04-12 ENCOUNTER — Telehealth: Payer: Self-pay

## 2022-04-12 NOTE — Telephone Encounter (Signed)
Appointment rescheduled.

## 2022-04-16 ENCOUNTER — Encounter: Payer: Self-pay | Admitting: Internal Medicine

## 2022-04-16 ENCOUNTER — Inpatient Hospital Stay: Payer: Medicaid Other

## 2022-04-16 ENCOUNTER — Telehealth: Payer: Self-pay

## 2022-04-16 ENCOUNTER — Inpatient Hospital Stay (HOSPITAL_BASED_OUTPATIENT_CLINIC_OR_DEPARTMENT_OTHER): Payer: Medicaid Other | Admitting: Internal Medicine

## 2022-04-16 ENCOUNTER — Other Ambulatory Visit: Payer: Self-pay

## 2022-04-16 ENCOUNTER — Encounter: Payer: Self-pay | Admitting: Licensed Clinical Social Worker

## 2022-04-16 ENCOUNTER — Inpatient Hospital Stay: Payer: Medicaid Other | Attending: Radiation Oncology

## 2022-04-16 VITALS — BP 116/80 | HR 73 | Temp 97.7°F | Ht 67.0 in | Wt 148.6 lb

## 2022-04-16 DIAGNOSIS — Z79899 Other long term (current) drug therapy: Secondary | ICD-10-CM | POA: Insufficient documentation

## 2022-04-16 DIAGNOSIS — C7951 Secondary malignant neoplasm of bone: Secondary | ICD-10-CM | POA: Insufficient documentation

## 2022-04-16 DIAGNOSIS — C3411 Malignant neoplasm of upper lobe, right bronchus or lung: Secondary | ICD-10-CM | POA: Insufficient documentation

## 2022-04-16 DIAGNOSIS — E876 Hypokalemia: Secondary | ICD-10-CM | POA: Insufficient documentation

## 2022-04-16 DIAGNOSIS — C787 Secondary malignant neoplasm of liver and intrahepatic bile duct: Secondary | ICD-10-CM | POA: Diagnosis not present

## 2022-04-16 DIAGNOSIS — K5900 Constipation, unspecified: Secondary | ICD-10-CM | POA: Insufficient documentation

## 2022-04-16 DIAGNOSIS — E119 Type 2 diabetes mellitus without complications: Secondary | ICD-10-CM | POA: Diagnosis not present

## 2022-04-16 DIAGNOSIS — Z87440 Personal history of urinary (tract) infections: Secondary | ICD-10-CM | POA: Diagnosis not present

## 2022-04-16 DIAGNOSIS — Z7984 Long term (current) use of oral hypoglycemic drugs: Secondary | ICD-10-CM | POA: Insufficient documentation

## 2022-04-16 DIAGNOSIS — N390 Urinary tract infection, site not specified: Secondary | ICD-10-CM | POA: Diagnosis not present

## 2022-04-16 DIAGNOSIS — Z5112 Encounter for antineoplastic immunotherapy: Secondary | ICD-10-CM | POA: Insufficient documentation

## 2022-04-16 DIAGNOSIS — B9620 Unspecified Escherichia coli [E. coli] as the cause of diseases classified elsewhere: Secondary | ICD-10-CM | POA: Diagnosis not present

## 2022-04-16 DIAGNOSIS — E7800 Pure hypercholesterolemia, unspecified: Secondary | ICD-10-CM | POA: Diagnosis not present

## 2022-04-16 DIAGNOSIS — M1711 Unilateral primary osteoarthritis, right knee: Secondary | ICD-10-CM | POA: Insufficient documentation

## 2022-04-16 DIAGNOSIS — Z8719 Personal history of other diseases of the digestive system: Secondary | ICD-10-CM | POA: Insufficient documentation

## 2022-04-16 DIAGNOSIS — F17210 Nicotine dependence, cigarettes, uncomplicated: Secondary | ICD-10-CM | POA: Insufficient documentation

## 2022-04-16 DIAGNOSIS — M25461 Effusion, right knee: Secondary | ICD-10-CM | POA: Diagnosis not present

## 2022-04-16 LAB — CBC WITH DIFFERENTIAL/PLATELET
Abs Immature Granulocytes: 0.01 10*3/uL (ref 0.00–0.07)
Basophils Absolute: 0 10*3/uL (ref 0.0–0.1)
Basophils Relative: 0 %
Eosinophils Absolute: 0.1 10*3/uL (ref 0.0–0.5)
Eosinophils Relative: 1 %
HCT: 45.6 % (ref 36.0–46.0)
Hemoglobin: 15.1 g/dL — ABNORMAL HIGH (ref 12.0–15.0)
Immature Granulocytes: 0 %
Lymphocytes Relative: 43 %
Lymphs Abs: 3 10*3/uL (ref 0.7–4.0)
MCH: 29.5 pg (ref 26.0–34.0)
MCHC: 33.1 g/dL (ref 30.0–36.0)
MCV: 89.1 fL (ref 80.0–100.0)
Monocytes Absolute: 0.8 10*3/uL (ref 0.1–1.0)
Monocytes Relative: 11 %
Neutro Abs: 3.1 10*3/uL (ref 1.7–7.7)
Neutrophils Relative %: 45 %
Platelets: 307 10*3/uL (ref 150–400)
RBC: 5.12 MIL/uL — ABNORMAL HIGH (ref 3.87–5.11)
RDW: 14.6 % (ref 11.5–15.5)
WBC: 7 10*3/uL (ref 4.0–10.5)
nRBC: 0 % (ref 0.0–0.2)

## 2022-04-16 LAB — COMPREHENSIVE METABOLIC PANEL
ALT: 9 U/L (ref 0–44)
AST: 15 U/L (ref 15–41)
Albumin: 3.8 g/dL (ref 3.5–5.0)
Alkaline Phosphatase: 80 U/L (ref 38–126)
Anion gap: 5 (ref 5–15)
BUN: 15 mg/dL (ref 6–20)
CO2: 26 mmol/L (ref 22–32)
Calcium: 8.5 mg/dL — ABNORMAL LOW (ref 8.9–10.3)
Chloride: 107 mmol/L (ref 98–111)
Creatinine, Ser: 0.61 mg/dL (ref 0.44–1.00)
GFR, Estimated: >60 mL/min (ref >60)
Glucose, Bld: 102 mg/dL — ABNORMAL HIGH (ref 70–99)
Potassium: 3.4 mmol/L — ABNORMAL LOW (ref 3.5–5.1)
Sodium: 138 mmol/L (ref 135–145)
Total Bilirubin: 0.4 mg/dL (ref 0.3–1.2)
Total Protein: 7.3 g/dL (ref 6.5–8.1)

## 2022-04-16 LAB — URINALYSIS, COMPLETE (UACMP) WITH MICROSCOPIC
Bilirubin Urine: NEGATIVE
Glucose, UA: NEGATIVE mg/dL
Ketones, ur: NEGATIVE mg/dL
Nitrite: NEGATIVE
Protein, ur: NEGATIVE mg/dL
Specific Gravity, Urine: 1.027 (ref 1.005–1.030)
pH: 5 (ref 5.0–8.0)

## 2022-04-16 MED ORDER — MORPHINE SULFATE ER 30 MG PO TBCR
30.0000 mg | EXTENDED_RELEASE_TABLET | Freq: Two times a day (BID) | ORAL | 0 refills | Status: DC
  Filled 2022-04-16: qty 60, 30d supply, fill #0
  Filled 2022-04-19: qty 14, 7d supply, fill #0
  Filled 2022-04-20: qty 60, 30d supply, fill #0

## 2022-04-16 MED ORDER — HEPARIN SOD (PORK) LOCK FLUSH 100 UNIT/ML IV SOLN
500.0000 [IU] | Freq: Once | INTRAVENOUS | Status: AC
  Administered 2022-04-16: 500 [IU] via INTRAVENOUS
  Filled 2022-04-16: qty 5

## 2022-04-16 MED ORDER — CIPROFLOXACIN HCL 500 MG PO TABS
500.0000 mg | ORAL_TABLET | Freq: Two times a day (BID) | ORAL | 0 refills | Status: DC
  Filled 2022-04-16: qty 10, 5d supply, fill #0

## 2022-04-16 NOTE — Telephone Encounter (Signed)
Stacie Kennedy's info was given to the pt.
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
## 2022-04-18 LAB — URINE CULTURE: Culture: >=100000 — AB

## 2022-04-19 ENCOUNTER — Encounter: Payer: Self-pay | Admitting: Internal Medicine

## 2022-04-19 ENCOUNTER — Other Ambulatory Visit: Payer: Self-pay

## 2022-04-20 ENCOUNTER — Telehealth: Payer: Self-pay

## 2022-04-20 ENCOUNTER — Encounter: Payer: Self-pay | Admitting: Internal Medicine

## 2022-04-20 ENCOUNTER — Other Ambulatory Visit: Payer: Self-pay

## 2022-04-20 ENCOUNTER — Other Ambulatory Visit: Payer: Self-pay | Admitting: Internal Medicine

## 2022-04-20 MED ORDER — NITROFURANTOIN MONOHYD MACRO 100 MG PO CAPS
100.0000 mg | ORAL_CAPSULE | Freq: Two times a day (BID) | ORAL | 0 refills | Status: DC
  Filled 2022-04-20: qty 14, 7d supply, fill #0

## 2022-04-20 NOTE — Telephone Encounter (Signed)
Morphine PA submitted by fax via Cover My Meds (Key: [id number])  Form faxed thru Cover Myb Meds:    [place] Medicaid Long-Acting Opioid Analgesics Prior Authorization Form Prior Authorization Requests for Long-Acting Opioid Analgesics Medications for [place] Medicaid Members  [phone] phone [phone] fax

## 2022-04-20 NOTE — Progress Notes (Signed)
Sent a prescription of Macrobid; discontinue ciprofloxacin.  GB

## 2022-04-20 NOTE — Telephone Encounter (Signed)
SECURE CHAT FROM DR.BRAHMANDAY: Please inform patient that she needs a different antibiotic.  Her E. coli is resistant to ciprofloxacin.  I sent a prescription of Macrobid; discontinue ciprofloxacin.  Attempt made to reach patient. Unable to reach Patient and unable to leave a voicemail.

## 2022-04-20 NOTE — Telephone Encounter (Signed)
PA approved from 04/20/22 to 04/21/23, pharmacy notified.

## 2022-04-20 NOTE — Telephone Encounter (Signed)
Patient informed of medication change 

## 2022-04-22 ENCOUNTER — Inpatient Hospital Stay: Payer: Medicaid Other | Admitting: Licensed Clinical Social Worker

## 2022-04-22 ENCOUNTER — Telehealth: Payer: Self-pay | Admitting: *Deleted

## 2022-04-22 DIAGNOSIS — C3411 Malignant neoplasm of upper lobe, right bronchus or lung: Secondary | ICD-10-CM

## 2022-04-22 NOTE — Telephone Encounter (Signed)
[facility] informed OK with Dr. B for patient to Diclofenac.

## 2022-04-22 NOTE — Telephone Encounter (Signed)
Message from answering service [facility] is asking if patient can take Diclofenac for an ortho issue she has. Please advise.

## 2022-04-22 NOTE — Telephone Encounter (Signed)
OK to move if the schedule allows.
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
## 2022-04-22 NOTE — Telephone Encounter (Signed)
Pt would like to know if she can move her appt on Friday 7/14 to Monday 7/17. Please advise.

## 2022-04-23 ENCOUNTER — Inpatient Hospital Stay: Payer: Medicaid Other

## 2022-04-23 ENCOUNTER — Other Ambulatory Visit: Payer: Self-pay

## 2022-04-23 ENCOUNTER — Inpatient Hospital Stay: Payer: Medicaid Other | Admitting: Internal Medicine

## 2022-04-23 ENCOUNTER — Encounter: Payer: Self-pay | Admitting: Internal Medicine

## 2022-04-23 MED ORDER — DICLOFENAC SODIUM 75 MG PO TBEC
75.0000 mg | DELAYED_RELEASE_TABLET | Freq: Two times a day (BID) | ORAL | 0 refills | Status: DC
  Filled 2022-04-23 (×2): qty 30, 15d supply, fill #0

## 2022-04-27 ENCOUNTER — Inpatient Hospital Stay: Payer: Medicaid Other

## 2022-04-27 ENCOUNTER — Other Ambulatory Visit: Payer: Self-pay

## 2022-04-27 ENCOUNTER — Inpatient Hospital Stay: Payer: Medicaid Other | Admitting: Internal Medicine

## 2022-04-28 ENCOUNTER — Other Ambulatory Visit: Payer: Self-pay

## 2022-04-28 MED ORDER — MELOXICAM 7.5 MG PO TABS
7.5000 mg | ORAL_TABLET | Freq: Two times a day (BID) | ORAL | 0 refills | Status: DC
  Filled 2022-04-28: qty 30, 15d supply, fill #0

## 2022-04-29 ENCOUNTER — Other Ambulatory Visit: Payer: Self-pay

## 2022-04-29 DIAGNOSIS — C3411 Malignant neoplasm of upper lobe, right bronchus or lung: Secondary | ICD-10-CM

## 2022-04-30 ENCOUNTER — Encounter: Payer: Self-pay | Admitting: Oncology

## 2022-04-30 ENCOUNTER — Encounter: Payer: Self-pay | Admitting: Internal Medicine

## 2022-04-30 ENCOUNTER — Inpatient Hospital Stay: Payer: Medicaid Other

## 2022-04-30 ENCOUNTER — Other Ambulatory Visit: Payer: Self-pay

## 2022-04-30 ENCOUNTER — Inpatient Hospital Stay (HOSPITAL_BASED_OUTPATIENT_CLINIC_OR_DEPARTMENT_OTHER): Payer: Medicaid Other | Admitting: Oncology

## 2022-04-30 VITALS — BP 118/75 | HR 64 | Temp 98.7°F | Resp 20 | Wt 149.6 lb

## 2022-04-30 DIAGNOSIS — C3411 Malignant neoplasm of upper lobe, right bronchus or lung: Secondary | ICD-10-CM

## 2022-04-30 DIAGNOSIS — Z5112 Encounter for antineoplastic immunotherapy: Secondary | ICD-10-CM | POA: Diagnosis not present

## 2022-04-30 LAB — CBC WITH DIFFERENTIAL/PLATELET
Abs Immature Granulocytes: 0.02 10*3/uL (ref 0.00–0.07)
Basophils Absolute: 0 10*3/uL (ref 0.0–0.1)
Basophils Relative: 1 %
Eosinophils Absolute: 0.1 10*3/uL (ref 0.0–0.5)
Eosinophils Relative: 2 %
HCT: 44.6 % (ref 36.0–46.0)
Hemoglobin: 14.6 g/dL (ref 12.0–15.0)
Immature Granulocytes: 0 %
Lymphocytes Relative: 52 %
Lymphs Abs: 3.6 10*3/uL (ref 0.7–4.0)
MCH: 29.2 pg (ref 26.0–34.0)
MCHC: 32.7 g/dL (ref 30.0–36.0)
MCV: 89.2 fL (ref 80.0–100.0)
Monocytes Absolute: 0.6 10*3/uL (ref 0.1–1.0)
Monocytes Relative: 9 %
Neutro Abs: 2.5 10*3/uL (ref 1.7–7.7)
Neutrophils Relative %: 36 %
Platelets: 295 10*3/uL (ref 150–400)
RBC: 5 MIL/uL (ref 3.87–5.11)
RDW: 15.4 % (ref 11.5–15.5)
WBC: 6.8 10*3/uL (ref 4.0–10.5)
nRBC: 0 % (ref 0.0–0.2)

## 2022-04-30 LAB — COMPREHENSIVE METABOLIC PANEL
ALT: 8 U/L (ref 0–44)
AST: 17 U/L (ref 15–41)
Albumin: 3.6 g/dL (ref 3.5–5.0)
Alkaline Phosphatase: 78 U/L (ref 38–126)
Anion gap: 5 (ref 5–15)
BUN: 9 mg/dL (ref 6–20)
CO2: 26 mmol/L (ref 22–32)
Calcium: 9.3 mg/dL (ref 8.9–10.3)
Chloride: 105 mmol/L (ref 98–111)
Creatinine, Ser: 0.58 mg/dL (ref 0.44–1.00)
GFR, Estimated: >60 mL/min (ref >60)
Glucose, Bld: 102 mg/dL — ABNORMAL HIGH (ref 70–99)
Potassium: 3.6 mmol/L (ref 3.5–5.1)
Sodium: 136 mmol/L (ref 135–145)
Total Bilirubin: 0.3 mg/dL (ref 0.3–1.2)
Total Protein: 6.7 g/dL (ref 6.5–8.1)

## 2022-04-30 MED ORDER — SODIUM CHLORIDE 0.9 % IV SOLN
1200.0000 mg | Freq: Once | INTRAVENOUS | Status: AC
  Administered 2022-04-30: 1200 mg via INTRAVENOUS
  Filled 2022-04-30: qty 20

## 2022-04-30 MED ORDER — HEPARIN SOD (PORK) LOCK FLUSH 100 UNIT/ML IV SOLN
500.0000 [IU] | Freq: Once | INTRAVENOUS | Status: AC | PRN
  Administered 2022-04-30: 500 [IU]
  Filled 2022-04-30: qty 5

## 2022-04-30 MED ORDER — SODIUM CHLORIDE 0.9 % IV SOLN
Freq: Once | INTRAVENOUS | Status: AC
  Filled 2022-04-30: qty 250

## 2022-04-30 NOTE — Patient Instructions (Signed)
[facility] AT [place]  Discharge Instructions: Thank you for choosing [facility] to provide your oncology and hematology care.  If you have a lab appointment with the [facility], please go directly to the [facility] and check in at the registration area.  Wear comfortable clothing and clothing appropriate for easy access to any Portacath or PICC line.   We strive to give you quality time with your provider. You may need to reschedule your appointment if you arrive late (15 or more minutes).  Arriving late affects you and other patients whose appointments are after yours.  Also, if you miss three or more appointments without notifying the office, you may be dismissed from the clinic at the provider's discretion.      For prescription refill requests, have your pharmacy contact our office and allow 72 hours for refills to be completed.    Today you received the following chemotherapy and/or immunotherapy agents Tecentriq      To help prevent nausea and vomiting after your treatment, we encourage you to take your nausea medication as directed.  BELOW ARE SYMPTOMS THAT SHOULD BE REPORTED IMMEDIATELY: *FEVER GREATER THAN 100.4 F (38 C) OR HIGHER *CHILLS OR SWEATING *NAUSEA AND VOMITING THAT IS NOT CONTROLLED WITH YOUR NAUSEA MEDICATION *UNUSUAL SHORTNESS OF BREATH *UNUSUAL BRUISING OR BLEEDING *URINARY PROBLEMS (pain or burning when urinating, or frequent urination) *BOWEL PROBLEMS (unusual diarrhea, constipation, pain near the anus) TENDERNESS IN MOUTH AND THROAT WITH OR WITHOUT PRESENCE OF ULCERS (sore throat, sores in mouth, or a toothache) UNUSUAL RASH, SWELLING OR PAIN  UNUSUAL VAGINAL DISCHARGE OR ITCHING   Items with * indicate a potential emergency and should be followed up as soon as possible or go to the Emergency Department if any problems should occur.  Please show the CHEMOTHERAPY ALERT CARD or IMMUNOTHERAPY ALERT CARD at check-in to  the Emergency Department and triage nurse.  Should you have questions after your visit or need to cancel or reschedule your appointment, please contact [facility] [place] AT [place]  [phone] and follow the prompts.  Office hours are 8:00 a.m. to 4:30 p.m. Monday - Friday. Please note that voicemails left after 4:00 p.m. may not be returned until the following business day.  We are closed weekends and major holidays. You have access to a nurse at all times for urgent questions. Please call the main number to the clinic [phone] and follow the prompts.  For any non-urgent questions, you may also contact your provider using MyChart. We now offer e-Visits for anyone 84 and older to request care online for non-urgent symptoms. For details visit [email or web address].   Also download the MyChart app! Go to the app store, search "MyChart", open the app, select [facility], and log in with your MyChart username and password.  Masks are optional in the cancer centers. If you would like for your care team to wear a mask while they are taking care of you, please let them know. For doctor visits, patients may have with them one support person who is at least 61 years old. At this time, visitors are not allowed in the infusion area.
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
## 2022-05-01 LAB — THYROID PANEL WITH TSH
Free Thyroxine Index: 2.2 (ref 1.2–4.9)
T3 Uptake Ratio: 29 % (ref 24–39)
T4, Total: 7.7 ug/dL (ref 4.5–12.0)
TSH: 1.53 u[IU]/mL (ref 0.450–4.500)

## 2022-05-14 ENCOUNTER — Encounter: Payer: Self-pay | Admitting: Internal Medicine

## 2022-05-14 ENCOUNTER — Other Ambulatory Visit: Payer: Self-pay

## 2022-05-14 ENCOUNTER — Other Ambulatory Visit: Payer: Self-pay | Admitting: *Deleted

## 2022-05-14 MED ORDER — OXYCODONE HCL 10 MG PO TABS
10.0000 mg | ORAL_TABLET | Freq: Two times a day (BID) | ORAL | 0 refills | Status: DC | PRN
Start: 2022-05-14 — End: 2022-06-01
  Filled 2022-05-14: qty 60, 30d supply, fill #0

## 2022-05-18 ENCOUNTER — Other Ambulatory Visit: Payer: Self-pay

## 2022-05-21 ENCOUNTER — Inpatient Hospital Stay: Payer: Medicaid Other

## 2022-05-21 ENCOUNTER — Encounter: Payer: Self-pay | Admitting: Internal Medicine

## 2022-05-21 ENCOUNTER — Ambulatory Visit
Admission: RE | Admit: 2022-05-21 | Discharge: 2022-05-21 | Disposition: A | Discharge status: Home / Self Care | Payer: Medicaid Other | Source: Ambulatory Visit | Attending: Internal Medicine | Admitting: Internal Medicine

## 2022-05-21 ENCOUNTER — Inpatient Hospital Stay (HOSPITAL_BASED_OUTPATIENT_CLINIC_OR_DEPARTMENT_OTHER): Payer: Medicaid Other | Admitting: Internal Medicine

## 2022-05-21 ENCOUNTER — Ambulatory Visit
Admission: RE | Admit: 2022-05-21 | Discharge: 2022-05-21 | Disposition: A | Discharge status: Home / Self Care | Payer: Medicaid Other | Attending: Internal Medicine | Admitting: Internal Medicine

## 2022-05-21 ENCOUNTER — Inpatient Hospital Stay: Payer: Medicaid Other | Attending: Radiation Oncology

## 2022-05-21 VITALS — BP 123/78 | HR 79 | Temp 97.1°F | Ht 67.0 in | Wt 147.2 lb

## 2022-05-21 DIAGNOSIS — E876 Hypokalemia: Secondary | ICD-10-CM | POA: Insufficient documentation

## 2022-05-21 DIAGNOSIS — Z5112 Encounter for antineoplastic immunotherapy: Secondary | ICD-10-CM | POA: Diagnosis not present

## 2022-05-21 DIAGNOSIS — Z791 Long term (current) use of non-steroidal anti-inflammatories (NSAID): Secondary | ICD-10-CM | POA: Insufficient documentation

## 2022-05-21 DIAGNOSIS — C3411 Malignant neoplasm of upper lobe, right bronchus or lung: Secondary | ICD-10-CM

## 2022-05-21 DIAGNOSIS — K5900 Constipation, unspecified: Secondary | ICD-10-CM | POA: Diagnosis not present

## 2022-05-21 DIAGNOSIS — Z7984 Long term (current) use of oral hypoglycemic drugs: Secondary | ICD-10-CM | POA: Diagnosis not present

## 2022-05-21 DIAGNOSIS — M25511 Pain in right shoulder: Secondary | ICD-10-CM | POA: Diagnosis present

## 2022-05-21 DIAGNOSIS — M25569 Pain in unspecified knee: Secondary | ICD-10-CM | POA: Diagnosis not present

## 2022-05-21 DIAGNOSIS — C7951 Secondary malignant neoplasm of bone: Secondary | ICD-10-CM | POA: Insufficient documentation

## 2022-05-21 DIAGNOSIS — H209 Unspecified iridocyclitis: Secondary | ICD-10-CM | POA: Insufficient documentation

## 2022-05-21 DIAGNOSIS — C787 Secondary malignant neoplasm of liver and intrahepatic bile duct: Secondary | ICD-10-CM | POA: Diagnosis not present

## 2022-05-21 DIAGNOSIS — Z79891 Long term (current) use of opiate analgesic: Secondary | ICD-10-CM | POA: Diagnosis not present

## 2022-05-21 DIAGNOSIS — Z86010 Personal history of colonic polyps: Secondary | ICD-10-CM | POA: Insufficient documentation

## 2022-05-21 DIAGNOSIS — E7800 Pure hypercholesterolemia, unspecified: Secondary | ICD-10-CM | POA: Insufficient documentation

## 2022-05-21 DIAGNOSIS — J449 Chronic obstructive pulmonary disease, unspecified: Secondary | ICD-10-CM | POA: Insufficient documentation

## 2022-05-21 DIAGNOSIS — E119 Type 2 diabetes mellitus without complications: Secondary | ICD-10-CM | POA: Insufficient documentation

## 2022-05-21 DIAGNOSIS — Z79899 Other long term (current) drug therapy: Secondary | ICD-10-CM | POA: Insufficient documentation

## 2022-05-21 DIAGNOSIS — F17210 Nicotine dependence, cigarettes, uncomplicated: Secondary | ICD-10-CM | POA: Insufficient documentation

## 2022-05-21 DIAGNOSIS — K429 Umbilical hernia without obstruction or gangrene: Secondary | ICD-10-CM | POA: Insufficient documentation

## 2022-05-21 LAB — COMPREHENSIVE METABOLIC PANEL
ALT: 8 U/L (ref 0–44)
AST: 14 U/L — ABNORMAL LOW (ref 15–41)
Albumin: 3.7 g/dL (ref 3.5–5.0)
Alkaline Phosphatase: 62 U/L (ref 38–126)
Anion gap: 8 (ref 5–15)
BUN: 9 mg/dL (ref 6–20)
CO2: 25 mmol/L (ref 22–32)
Calcium: 8.6 mg/dL — ABNORMAL LOW (ref 8.9–10.3)
Chloride: 103 mmol/L (ref 98–111)
Creatinine, Ser: 0.52 mg/dL (ref 0.44–1.00)
GFR, Estimated: >60 mL/min (ref >60)
Glucose, Bld: 130 mg/dL — ABNORMAL HIGH (ref 70–99)
Potassium: 3.3 mmol/L — ABNORMAL LOW (ref 3.5–5.1)
Sodium: 136 mmol/L (ref 135–145)
Total Bilirubin: 0.3 mg/dL (ref 0.3–1.2)
Total Protein: 7.1 g/dL (ref 6.5–8.1)

## 2022-05-21 LAB — CBC WITH DIFFERENTIAL/PLATELET
Abs Immature Granulocytes: 0.03 10*3/uL (ref 0.00–0.07)
Basophils Absolute: 0 10*3/uL (ref 0.0–0.1)
Basophils Relative: 0 %
Eosinophils Absolute: 0.1 10*3/uL (ref 0.0–0.5)
Eosinophils Relative: 1 %
HCT: 43.5 % (ref 36.0–46.0)
Hemoglobin: 14.9 g/dL (ref 12.0–15.0)
Immature Granulocytes: 0 %
Lymphocytes Relative: 28 %
Lymphs Abs: 2.2 10*3/uL (ref 0.7–4.0)
MCH: 29.6 pg (ref 26.0–34.0)
MCHC: 34.3 g/dL (ref 30.0–36.0)
MCV: 86.5 fL (ref 80.0–100.0)
Monocytes Absolute: 0.6 10*3/uL (ref 0.1–1.0)
Monocytes Relative: 8 %
Neutro Abs: 4.9 10*3/uL (ref 1.7–7.7)
Neutrophils Relative %: 63 %
Platelets: 280 10*3/uL (ref 150–400)
RBC: 5.03 MIL/uL (ref 3.87–5.11)
RDW: 16.4 % — ABNORMAL HIGH (ref 11.5–15.5)
WBC: 7.9 10*3/uL (ref 4.0–10.5)
nRBC: 0 % (ref 0.0–0.2)

## 2022-05-21 LAB — TSH: TSH: 1.21 u[IU]/mL (ref 0.350–4.500)

## 2022-05-21 MED ORDER — MORPHINE SULFATE (PF) 2 MG/ML IV SOLN
INTRAVENOUS | Status: AC
  Filled 2022-05-21: qty 1

## 2022-05-21 MED ORDER — HEPARIN SOD (PORK) LOCK FLUSH 100 UNIT/ML IV SOLN
500.0000 [IU] | Freq: Once | INTRAVENOUS | Status: AC | PRN
  Administered 2022-05-21: 500 [IU]
  Filled 2022-05-21: qty 5

## 2022-05-21 MED ORDER — SODIUM CHLORIDE 0.9 % IV SOLN
1200.0000 mg | Freq: Once | INTRAVENOUS | Status: AC
  Administered 2022-05-21: 1200 mg via INTRAVENOUS
  Filled 2022-05-21: qty 20

## 2022-05-21 MED ORDER — MORPHINE SULFATE (PF) 2 MG/ML IV SOLN
2.0000 mg | Freq: Once | INTRAVENOUS | Status: AC
  Administered 2022-05-21: 2 mg via INTRAMUSCULAR

## 2022-05-21 MED ORDER — SODIUM CHLORIDE 0.9 % IV SOLN
Freq: Once | INTRAVENOUS | Status: AC
  Filled 2022-05-21: qty 250

## 2022-05-21 NOTE — Patient Instructions (Signed)
[facility] CANCER CTR AT [place]  Discharge Instructions: Thank you for choosing [facility] to provide your oncology and hematology care.  If you have a lab appointment with the [facility], please go directly to the [facility] and check in at the registration area.  Wear comfortable clothing and clothing appropriate for easy access to any Portacath or PICC line.   We strive to give you quality time with your provider. You may need to reschedule your appointment if you arrive late (15 or more minutes).  Arriving late affects you and other patients whose appointments are after yours.  Also, if you miss three or more appointments without notifying the office, you may be dismissed from the clinic at the provider's discretion.      For prescription refill requests, have your pharmacy contact our office and allow 72 hours for refills to be completed.    Today you received the following chemotherapy and/or immunotherapy agents : atezolizumab   To help prevent nausea and vomiting after your treatment, we encourage you to take your nausea medication as directed.  BELOW ARE SYMPTOMS THAT SHOULD BE REPORTED IMMEDIATELY: *FEVER GREATER THAN 100.4 F (38 C) OR HIGHER *CHILLS OR SWEATING *NAUSEA AND VOMITING THAT IS NOT CONTROLLED WITH YOUR NAUSEA MEDICATION *UNUSUAL SHORTNESS OF BREATH *UNUSUAL BRUISING OR BLEEDING *URINARY PROBLEMS (pain or burning when urinating, or frequent urination) *BOWEL PROBLEMS (unusual diarrhea, constipation, pain near the anus) TENDERNESS IN MOUTH AND THROAT WITH OR WITHOUT PRESENCE OF ULCERS (sore throat, sores in mouth, or a toothache) UNUSUAL RASH, SWELLING OR PAIN  UNUSUAL VAGINAL DISCHARGE OR ITCHING   Items with * indicate a potential emergency and should be followed up as soon as possible or go to the Emergency Department if any problems should occur.  Please show the CHEMOTHERAPY ALERT CARD or IMMUNOTHERAPY ALERT CARD at check-in to  the Emergency Department and triage nurse.  Should you have questions after your visit or need to cancel or reschedule your appointment, please contact [facility] CANCER [place] AT [place]  [phone] and follow the prompts.  Office hours are 8:00 a.m. to 4:30 p.m. Monday - Friday. Please note that voicemails left after 4:00 p.m. may not be returned until the following business day.  We are closed weekends and major holidays. You have access to a nurse at all times for urgent questions. Please call the main number to the clinic [phone] and follow the prompts.  For any non-urgent questions, you may also contact your provider using MyChart. We now offer e-Visits for anyone 36 and older to request care online for non-urgent symptoms. For details visit [email or web address].   Also download the MyChart app! Go to the app store, search "MyChart", open the app, select [facility], and log in with your MyChart username and password.  Masks are optional in the cancer centers. If you would like for your care team to wear a mask while they are taking care of you, please let them know. For doctor visits, patients may have with them one support person who is at least 61 years old. At this time, visitors are not allowed in the infusion area.

## 2022-05-21 NOTE — Assessment & Plan Note (Addendum)
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 s/p carbo etoposide-Tecentriq. JUNE 7th, 2023- CT cap-  Reduced size and enhancement of hepatic metastatic lesions;Increase mineralization of the right scapular metastatic lesion compatible with healing response.  # Proceed cycle #11- Tecentriq ONLY maintenance today. Labs today reviewed;  acceptable for treatment today.  See discussion below regarding worsening arm pain  # mets to bone/ Pain right shoulder- currently s/p RT [ 09/16/2021]; WORSENING- currently MS contin to 30 mg BID: and oxycodone 10 mg 6 4 hours now- ? Progression- check X-rays. STAT.  Also order morphine 2 mg IV in the clinic.  # MAY 2023- Right swollen eye/orbital inflammation/uveitis s/p Zometa status post steroids improved.-reviewed literature case reports noted; less concerning for tumor involvement.  DISCONTINUE ZOMETA. Consider X-geva-HOLD for now.   # Hypokalemia: K- 3.2; discussed re: dietary supp-STABLE.  # constipation:  Miralax BID; fluids; Dulcolax- STABLE.  # DM:  On Metformin. STABLE.  # COPD: refilled albuterol;STABLE.  #IV access: port functional-  functional  # DISPOSITION: # Tecentriq today-  # follow up in 2 weeks; MD; no labs/no chemo- Dr.B.

## 2022-05-21 NOTE — Progress Notes (Signed)
Patient in tears, arm pain 8/10, stated she didn't take anything this morning. MD notified

## 2022-05-21 NOTE — Progress Notes (Signed)
Pt tearful but stated no concerns other than rt arm pain, no injury.

## 2022-05-21 NOTE — Progress Notes (Signed)
OFF PATHWAY REGIMEN - Other  No Change  Continue With Treatment as Ordered.  Original Decision Date/Time: 08/31/2021 09:39   [id number]:Atezolizumab D1 + Carboplatin D1 + Etoposide  IV D1-3 q21 Days x 4 Cycles Followed by Huey Bienenstock D1 q21 Days:   Cycles 1 through 4, every 21 days:     Atezolizumab      Carboplatin      Etoposide    Cycles 5 and beyond, every 21 days:     Atezolizumab   **Always confirm dose/schedule in your pharmacy ordering system**  Patient Characteristics: Intent of Therapy: Non-Curative / Palliative Intent, Discussed with Patient

## 2022-05-21 NOTE — Progress Notes (Signed)
Patient tolerated atezolizumab infusion well, no questions/concerns voiced. Pain improved. Patient stable at discharge. AVS given.
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
## 2022-06-01 ENCOUNTER — Other Ambulatory Visit: Payer: Self-pay

## 2022-06-01 ENCOUNTER — Inpatient Hospital Stay (HOSPITAL_BASED_OUTPATIENT_CLINIC_OR_DEPARTMENT_OTHER): Payer: Medicaid Other | Admitting: Internal Medicine

## 2022-06-01 ENCOUNTER — Encounter: Payer: Self-pay | Admitting: Internal Medicine

## 2022-06-01 ENCOUNTER — Telehealth: Payer: Self-pay

## 2022-06-01 VITALS — BP 133/80 | HR 72 | Temp 98.1°F | Ht 67.0 in | Wt 147.0 lb

## 2022-06-01 DIAGNOSIS — C3411 Malignant neoplasm of upper lobe, right bronchus or lung: Secondary | ICD-10-CM | POA: Diagnosis not present

## 2022-06-01 DIAGNOSIS — Z5112 Encounter for antineoplastic immunotherapy: Secondary | ICD-10-CM | POA: Diagnosis not present

## 2022-06-01 MED ORDER — OXYCODONE HCL 10 MG PO TABS
10.0000 mg | ORAL_TABLET | Freq: Three times a day (TID) | ORAL | 0 refills | Status: DC | PRN
Start: 2022-06-01 — End: 2022-06-16
  Filled 2022-06-01: qty 60, 20d supply, fill #0
  Filled 2022-06-15: qty 15, 5d supply, fill #0

## 2022-06-01 MED ORDER — MORPHINE SULFATE ER 30 MG PO TBCR
30.0000 mg | EXTENDED_RELEASE_TABLET | Freq: Three times a day (TID) | ORAL | 0 refills | Status: DC
  Filled 2022-06-01 – 2022-06-15 (×2): qty 90, 30d supply, fill #0

## 2022-06-01 NOTE — Assessment & Plan Note (Addendum)
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 s/p carbo etoposide-Tecentriq. JUNE 7th, 2023- CT cap-  Reduced size and enhancement of hepatic metastatic lesions;Increase mineralization of the right scapular metastatic lesion compatible with healing response.  #Status Tecentriq post cycle #11 approximately 2 weeks ago.  Proceed cycle #12 in 2 weeks- Tecentriq.  However would recommend a PET scan for further evaluation given the worsening right shoulder/arm pain.  Reviewed x-rays negative for any acute process.  # mets to bone/ Pain right shoulder- currently s/p RT [ 09/16/2021]; WORSENING-increase MS Contin to 30 mg 3 times daily and also recommend oxycodone 10 mg every 8 hours also.  New prescription given.  # MAY 2023- Right swollen eye/orbital inflammation/uveitis s/p Zometa status post steroids improved.-reviewed literature case reports noted; less concerning for tumor involvement.  DISCONTINUE ZOMETA. Consider X-geva-HOLD for now.   # Hypokalemia: K- 3.2; discussed re: dietary supp-STABLE.  # constipation:  Miralax BID; fluids; Dulcolax- STABLE.  # DM:  On Metformin. STABLE.  # COPD: refilled albuterol;STABLE.  #IV access: port functional-  functional  # DISPOSITION:  # follow up in 2 weeks/ sep, 7th- MD;port- labs- cbc/cmp; Atezoluzimab-  PET scan ASAP- Dr.B.

## 2022-06-01 NOTE — Telephone Encounter (Signed)
PA for Morphine Sulfate ER 30MG  er tablets submitted to [facility] [phone]) via Cover My Meds.
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
## 2022-06-03 ENCOUNTER — Encounter: Payer: Self-pay | Admitting: Internal Medicine

## 2022-06-03 NOTE — Telephone Encounter (Signed)
PA approved.

## 2022-06-11 ENCOUNTER — Other Ambulatory Visit: Payer: Self-pay

## 2022-06-15 ENCOUNTER — Other Ambulatory Visit: Payer: Self-pay

## 2022-06-15 ENCOUNTER — Encounter
Admission: RE | Admit: 2022-06-15 | Discharge: 2022-06-15 | Disposition: A | Discharge status: Home / Self Care | Payer: Medicaid Other | Source: Ambulatory Visit | Attending: Internal Medicine | Admitting: Internal Medicine

## 2022-06-15 DIAGNOSIS — C3411 Malignant neoplasm of upper lobe, right bronchus or lung: Secondary | ICD-10-CM | POA: Insufficient documentation

## 2022-06-15 LAB — GLUCOSE, CAPILLARY: Glucose-Capillary: 100 mg/dL — ABNORMAL HIGH (ref 70–99)

## 2022-06-15 MED ORDER — FLUDEOXYGLUCOSE F - 18 (FDG) INJECTION
7.6000 | Freq: Once | INTRAVENOUS | Status: AC | PRN
  Administered 2022-06-15: 8.31 via INTRAVENOUS

## 2022-06-16 ENCOUNTER — Encounter: Payer: Self-pay | Admitting: Internal Medicine

## 2022-06-16 ENCOUNTER — Telehealth: Payer: Self-pay | Admitting: *Deleted

## 2022-06-16 ENCOUNTER — Other Ambulatory Visit: Payer: Self-pay | Admitting: Internal Medicine

## 2022-06-16 ENCOUNTER — Other Ambulatory Visit: Payer: Self-pay

## 2022-06-16 MED ORDER — OXYCODONE-ACETAMINOPHEN 10-325 MG PO TABS
1.0000 | ORAL_TABLET | Freq: Three times a day (TID) | ORAL | 0 refills | Status: DC | PRN
  Filled 2022-06-16: qty 60, 20d supply, fill #0

## 2022-06-16 NOTE — Telephone Encounter (Signed)
Pt left message requesting if her oxycodone can be changed to percocet. Pt states that when she takes percocet it helps control her pain better than oxycodone. Please advise.

## 2022-06-16 NOTE — Telephone Encounter (Signed)
Pt is aware.  

## 2022-06-18 ENCOUNTER — Telehealth: Payer: Self-pay

## 2022-06-18 ENCOUNTER — Inpatient Hospital Stay: Payer: Medicaid Other

## 2022-06-18 ENCOUNTER — Other Ambulatory Visit: Payer: Self-pay

## 2022-06-18 ENCOUNTER — Encounter: Payer: Self-pay | Admitting: Internal Medicine

## 2022-06-18 ENCOUNTER — Inpatient Hospital Stay (HOSPITAL_BASED_OUTPATIENT_CLINIC_OR_DEPARTMENT_OTHER): Payer: Medicaid Other | Admitting: Internal Medicine

## 2022-06-18 VITALS — BP 106/66 | HR 65 | Temp 97.3°F | Resp 16 | Ht 67.0 in | Wt 146.6 lb

## 2022-06-18 DIAGNOSIS — C787 Secondary malignant neoplasm of liver and intrahepatic bile duct: Secondary | ICD-10-CM | POA: Insufficient documentation

## 2022-06-18 DIAGNOSIS — Z510 Encounter for antineoplastic radiation therapy: Secondary | ICD-10-CM | POA: Diagnosis present

## 2022-06-18 DIAGNOSIS — Z7984 Long term (current) use of oral hypoglycemic drugs: Secondary | ICD-10-CM | POA: Insufficient documentation

## 2022-06-18 DIAGNOSIS — C3411 Malignant neoplasm of upper lobe, right bronchus or lung: Secondary | ICD-10-CM

## 2022-06-18 DIAGNOSIS — K5900 Constipation, unspecified: Secondary | ICD-10-CM | POA: Insufficient documentation

## 2022-06-18 DIAGNOSIS — F17210 Nicotine dependence, cigarettes, uncomplicated: Secondary | ICD-10-CM | POA: Insufficient documentation

## 2022-06-18 DIAGNOSIS — C7A1 Malignant poorly differentiated neuroendocrine tumors: Secondary | ICD-10-CM | POA: Diagnosis not present

## 2022-06-18 DIAGNOSIS — Z86010 Personal history of colonic polyps: Secondary | ICD-10-CM | POA: Insufficient documentation

## 2022-06-18 DIAGNOSIS — C7951 Secondary malignant neoplasm of bone: Secondary | ICD-10-CM | POA: Insufficient documentation

## 2022-06-18 DIAGNOSIS — M25511 Pain in right shoulder: Secondary | ICD-10-CM | POA: Insufficient documentation

## 2022-06-18 DIAGNOSIS — E876 Hypokalemia: Secondary | ICD-10-CM | POA: Insufficient documentation

## 2022-06-18 DIAGNOSIS — K769 Liver disease, unspecified: Secondary | ICD-10-CM | POA: Diagnosis not present

## 2022-06-18 DIAGNOSIS — C7B8 Other secondary neuroendocrine tumors: Secondary | ICD-10-CM | POA: Diagnosis not present

## 2022-06-18 DIAGNOSIS — Z79899 Other long term (current) drug therapy: Secondary | ICD-10-CM | POA: Insufficient documentation

## 2022-06-18 DIAGNOSIS — E119 Type 2 diabetes mellitus without complications: Secondary | ICD-10-CM | POA: Insufficient documentation

## 2022-06-18 DIAGNOSIS — E7800 Pure hypercholesterolemia, unspecified: Secondary | ICD-10-CM | POA: Insufficient documentation

## 2022-06-18 DIAGNOSIS — Z5112 Encounter for antineoplastic immunotherapy: Secondary | ICD-10-CM | POA: Insufficient documentation

## 2022-06-18 LAB — CBC WITH DIFFERENTIAL/PLATELET
Abs Immature Granulocytes: 0.01 10*3/uL (ref 0.00–0.07)
Basophils Absolute: 0 10*3/uL (ref 0.0–0.1)
Basophils Relative: 1 %
Eosinophils Absolute: 0.1 10*3/uL (ref 0.0–0.5)
Eosinophils Relative: 2 %
HCT: 44.2 % (ref 36.0–46.0)
Hemoglobin: 14.6 g/dL (ref 12.0–15.0)
Immature Granulocytes: 0 %
Lymphocytes Relative: 51 %
Lymphs Abs: 3.4 10*3/uL (ref 0.7–4.0)
MCH: 29.1 pg (ref 26.0–34.0)
MCHC: 33 g/dL (ref 30.0–36.0)
MCV: 88.2 fL (ref 80.0–100.0)
Monocytes Absolute: 0.7 10*3/uL (ref 0.1–1.0)
Monocytes Relative: 10 %
Neutro Abs: 2.3 10*3/uL (ref 1.7–7.7)
Neutrophils Relative %: 36 %
Platelets: 307 10*3/uL (ref 150–400)
RBC: 5.01 MIL/uL (ref 3.87–5.11)
RDW: 16.7 % — ABNORMAL HIGH (ref 11.5–15.5)
WBC: 6.5 10*3/uL (ref 4.0–10.5)
nRBC: 0 % (ref 0.0–0.2)

## 2022-06-18 LAB — COMPREHENSIVE METABOLIC PANEL
ALT: 7 U/L (ref 0–44)
AST: 15 U/L (ref 15–41)
Albumin: 3.7 g/dL (ref 3.5–5.0)
Alkaline Phosphatase: 74 U/L (ref 38–126)
Anion gap: 4 — ABNORMAL LOW (ref 5–15)
BUN: 16 mg/dL (ref 6–20)
CO2: 27 mmol/L (ref 22–32)
Calcium: 8.8 mg/dL — ABNORMAL LOW (ref 8.9–10.3)
Chloride: 106 mmol/L (ref 98–111)
Creatinine, Ser: 0.51 mg/dL (ref 0.44–1.00)
GFR, Estimated: >60 mL/min (ref >60)
Glucose, Bld: 86 mg/dL (ref 70–99)
Potassium: 3.7 mmol/L (ref 3.5–5.1)
Sodium: 137 mmol/L (ref 135–145)
Total Bilirubin: 0.2 mg/dL — ABNORMAL LOW (ref 0.3–1.2)
Total Protein: 7 g/dL (ref 6.5–8.1)

## 2022-06-18 MED ORDER — HEPARIN SOD (PORK) LOCK FLUSH 100 UNIT/ML IV SOLN
500.0000 [IU] | Freq: Once | INTRAVENOUS | Status: DC | PRN
  Filled 2022-06-18: qty 5

## 2022-06-18 MED ORDER — SODIUM CHLORIDE 0.9 % IV SOLN
Freq: Once | INTRAVENOUS | Status: AC
  Filled 2022-06-18: qty 250

## 2022-06-18 MED ORDER — SODIUM CHLORIDE 0.9% FLUSH
10.0000 mL | Freq: Once | INTRAVENOUS | Status: AC
  Administered 2022-06-18: 10 mL via INTRAVENOUS
  Filled 2022-06-18: qty 10

## 2022-06-18 MED ORDER — HEPARIN SOD (PORK) LOCK FLUSH 100 UNIT/ML IV SOLN
500.0000 [IU] | Freq: Once | INTRAVENOUS | Status: AC
  Administered 2022-06-18: 500 [IU] via INTRAVENOUS
  Filled 2022-06-18: qty 5

## 2022-06-18 MED ORDER — LIDOCAINE 5 % EX PTCH
1.0000 | MEDICATED_PATCH | CUTANEOUS | 2 refills | Status: DC
  Filled 2022-06-18: qty 30, 30d supply, fill #0

## 2022-06-18 MED ORDER — SODIUM CHLORIDE 0.9 % IV SOLN
1200.0000 mg | Freq: Once | INTRAVENOUS | Status: AC
  Administered 2022-06-18: 1200 mg via INTRAVENOUS
  Filled 2022-06-18: qty 20

## 2022-06-18 NOTE — Telephone Encounter (Signed)
Order for liver biopsy faxed to spec sch.  Pending appt.
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
## 2022-06-18 NOTE — Telephone Encounter (Signed)
Per secure chat with [place]: Stacie Kennedy,  I have this pt scheduled for Tues 9/12 at 10a with an arrival time of 9a.  Please let me know if this does not work for her.  Thanks.

## 2022-06-18 NOTE — Progress Notes (Signed)
Patient reports right arm pain 4/10 today.  Appetite is decreasing and interested in discussing an appetite stimulant.    Only prescribed medication she is taking is the pain medication.

## 2022-06-18 NOTE — Telephone Encounter (Signed)
I have left a vm on her sisters phone #. Her vm was full. I have asked that they give [place] a call back to let us know if this date and time works.

## 2022-06-18 NOTE — Patient Instructions (Signed)
[facility] AT [place]  Discharge Instructions: Thank you for choosing [facility] to provide your oncology and hematology care.  If you have a lab appointment with the [facility], please go directly to the [facility] and check in at the registration area.  Wear comfortable clothing and clothing appropriate for easy access to any Portacath or PICC line.   We strive to give you quality time with your provider. You may need to reschedule your appointment if you arrive late (15 or more minutes).  Arriving late affects you and other patients whose appointments are after yours.  Also, if you miss three or more appointments without notifying the office, you may be dismissed from the clinic at the provider's discretion.      For prescription refill requests, have your pharmacy contact our office and allow 72 hours for refills to be completed.    Today you received the following chemotherapy and/or immunotherapy agents TECENTRIQ       To help prevent nausea and vomiting after your treatment, we encourage you to take your nausea medication as directed.  BELOW ARE SYMPTOMS THAT SHOULD BE REPORTED IMMEDIATELY: *FEVER GREATER THAN 100.4 F (38 C) OR HIGHER *CHILLS OR SWEATING *NAUSEA AND VOMITING THAT IS NOT CONTROLLED WITH YOUR NAUSEA MEDICATION *UNUSUAL SHORTNESS OF BREATH *UNUSUAL BRUISING OR BLEEDING *URINARY PROBLEMS (pain or burning when urinating, or frequent urination) *BOWEL PROBLEMS (unusual diarrhea, constipation, pain near the anus) TENDERNESS IN MOUTH AND THROAT WITH OR WITHOUT PRESENCE OF ULCERS (sore throat, sores in mouth, or a toothache) UNUSUAL RASH, SWELLING OR PAIN  UNUSUAL VAGINAL DISCHARGE OR ITCHING   Items with * indicate a potential emergency and should be followed up as soon as possible or go to the Emergency Department if any problems should occur.  Please show the CHEMOTHERAPY ALERT CARD or IMMUNOTHERAPY ALERT CARD at check-in to  the Emergency Department and triage nurse.  Should you have questions after your visit or need to cancel or reschedule your appointment, please contact [facility] [place] [facility] [employer]  [phone] and follow the prompts.  Office hours are 8:00 a.m. to 4:30 p.m. Monday - Friday. Please note that voicemails left after 4:00 p.m. may not be returned until the following business day.  We are closed weekends and major holidays. You have access to a nurse at all times for urgent questions. Please call the main number to the clinic [phone] and follow the prompts.  For any non-urgent questions, you may also contact your provider using MyChart. We now offer e-Visits for anyone 85 and older to request care online for non-urgent symptoms. For details visit [email or web address].   Also download the MyChart app! Go to the app store, search "MyChart", open the app, select [facility], and log in with your MyChart username and password.  Masks are optional in the cancer centers. If you would like for your care team to wear a mask while they are taking care of you, please let them know. For doctor visits, patients may have with them one support person who is at least 61 years old. At this time, visitors are not allowed in the infusion area.  Atezolizumab Injection What is this medication? ATEZOLIZUMAB (a te zoe LIZ ue mab) treats some types of cancer. It works by helping your immune system slow or stop the spread of cancer cells. It is a monoclonal antibody. This medicine may be used for other purposes; ask your health care provider or pharmacist if you  have questions. COMMON BRAND NAME(S): Tecentriq What should I tell my care team before I take this medication? They need to know if you have any of these conditions: Allogeneic stem cell transplant (uses someone else's stem cells) Autoimmune diseases, such as Crohn disease, ulcerative colitis, lupus History of chest radiation Nervous system  problems, such as Guillain-Barre syndrome, myasthenia gravis Organ transplant An unusual or allergic reaction to atezolizumab, other medications, foods, dyes, or preservatives Pregnant or trying to get pregnant Breast-feeding How should I use this medication? This medication is injected into a vein. It is given by your care team in a hospital or clinic setting. A special MedGuide will be given to you before each treatment. Be sure to read this information carefully each time. Talk to your care team about the use of this medication in children. While it may be prescribed for children as young as 2 years for selected conditions, precautions do apply. Overdosage: If you think you have taken too much of this medicine contact a poison control center or emergency room at once. NOTE: This medicine is only for you. Do not share this medicine with others. What if I miss a dose? Keep appointments for follow-up doses. It is important not to miss your dose. Call your care team if you are unable to keep an appointment. What may interact with this medication? Interactions have not been studied. This list may not describe all possible interactions. Give your health care provider a list of all the medicines, herbs, non-prescription drugs, or dietary supplements you use. Also tell them if you smoke, drink alcohol, or use illegal drugs. Some items may interact with your medicine. What should I watch for while using this medication? Your condition will be monitored carefully while you are receiving this medication. You may need blood work while taking this medication. This medication may cause serious skin reactions. They can happen weeks to months after starting the medication. Contact your care team right away if you notice fevers or flu-like symptoms with a rash. The rash may be red or purple and then turn into blisters or peeling of the skin. You may also notice a red rash with swelling of the face, lips, or  lymph nodes in your neck or under your arms. Tell your care team right away if you have any change in your eyesight. Talk to your care team if you may be pregnant. Serious birth defects can occur if you take this medication during pregnancy and for 5 months after the last dose. You will need a negative pregnancy test before starting this medication. Contraception is recommended while taking this medication and for 5 months after the last dose. Your care team can help you find the option that works for you. Do not breastfeed while taking this medication and for at least 5 months after the last dose. What side effects may I notice from receiving this medication? Side effects that you should report to your doctor or health care professional as soon as possible: Allergic reactions--skin rash, itching, hives, swelling of the face, lips, tongue, or throat Dry cough, shortness of breath or trouble breathing Eye pain, redness, irritation, or discharge with blurry or decreased vision Heart muscle inflammation--unusual weakness or fatigue, shortness of breath, chest pain, fast or irregular heartbeat, dizziness, swelling of the ankles, feet, or hands Hormone gland problems--headache, sensitivity to light, unusual weakness or fatigue, dizziness, fast or irregular heartbeat, increased sensitivity to cold or heat, excessive sweating, constipation, hair loss, increased thirst or amount  of urine, tremors or shaking, irritability Infusion reactions--chest pain, shortness of breath or trouble breathing, feeling faint or lightheaded Kidney injury (glomerulonephritis)--decrease in the amount of urine, red or dark brown urine, foamy or bubbly urine, swelling of the ankles, hands, or feet Liver injury--right upper belly pain, loss of appetite, nausea, light-colored stool, dark yellow or brown urine, yellowing skin or eyes, unusual weakness or fatigue Pain, tingling, or numbness in the hands or feet, muscle weakness, change  in vision, confusion or trouble speaking, loss of balance or coordination, trouble walking, seizures Rash, fever, and swollen lymph nodes Redness, blistering, peeling, or loosening of the skin, including inside the mouth Sudden or severe stomach pain, bloody diarrhea, fever, nausea, vomiting Side effects that usually do not require medical attention (report to your doctor or health care professional if they continue or are bothersome): Bone, joint, or muscle pain Diarrhea Fatigue Loss of appetite Nausea Skin rash This list may not describe all possible side effects. Call your doctor for medical advice about side effects. You may report side effects to FDA at 1-800-FDA-1088. Where should I keep my medication? This medication is given in a hospital or clinic. It will not be stored at home. NOTE: This sheet is a summary. It may not cover all possible information. If you have questions about this medicine, talk to your doctor, pharmacist, or health care provider.  2023 Elsevier/Gold Standard (2022-02-09 00:00:00)

## 2022-06-18 NOTE — Progress Notes (Unsigned)
BIOPSY REVIEW Date: 06/18/22  Requested Biopsy site: Liver mass vs R scapula  Reason for request: Hx met NSCLC  Imaging review: PET CT 06/15/22 Recommended imaging modality to perform biopsy: US Additional comments: Liver mass Bx with US.  Performing MD. prior XRT at R scapula, may have lesser diagnostic yield.  Please contact me with questions, concerns, or if issue pertaining to this request arise.  Jon Mugweru, MD [facility]  Pager. [phone] Clinic. [phone] 

## 2022-06-18 NOTE — Telephone Encounter (Signed)
PA for Lidocaine patches submitted to ins via Cover My Meds (Key: [id number])  Drug Lidocaine 5% patches Form [employer] [place]  [phone] phone [phone] fax
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
## 2022-06-18 NOTE — Assessment & Plan Note (Addendum)
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 SEP 6th 2023-PET scan- Two hypermetabolic LEFT hepatic lobe metastasis. Anterior lesion is increased in size from comparison CT;  Intense hypermetabolic RIGHT scapular metastasis with soft tissue extension;  No new areas of metastatic disease.  Currently on Tecentriq maintenance.   #  Proceed cycle #12 today. Tecentriq. Labs today reviewed;  acceptable for treatment today.  I reviewed the findings of the PET scan shows progressive disease.  Recommend liver biopsy.  Discussed with IR.  #However given progression of disease I would recommend switching to chemotherapy; will plan discontinue Tecentriq after this cycle.  Await biopsy-NGS testing when available.  # mets to bone/ Pain right shoulder- currently s/p RT [ 09/16/2021]; WORSENING-CONTINUE MS Contin to 30 mg 3 times daily and also continue percocet 10 mg every 8 hours also.  Discussed with Dr.Chrystal; plan reirradiation of the right scapular metastasis.  Also add lidoderm patches.   # MAY 2023- Right swollen eye/orbital inflammation/uveitis s/p Zometa status post steroids improved.-reviewed literature case reports noted; less concerning for tumor involvement.  DISCONTINUE ZOMETA. Consider X-geva-HOLD for now.   # Hypokalemia: K- 3.2; discussed re: dietary supp-STABLE.  # constipation:  Miralax BID; fluids; Dulcolax- STABLE.  # DM:  On Metformin. STABLE.  # COPD: refilled albuterol;STABLE.  #IV access: port functional-  functional  # DISPOSITION: # treatment today # cancel ALL appt in 3 weeks- # Liver Biopsy ASAP # follow up 10-14 days- MD: labs- cbc/cmp; NO chemo- Dr.B  # I reviewed the blood work- with the patient in detail; also reviewed the imaging independently [as summarized above]; and with the patient in detail.   # 40 minutes face-to-face with the patient discussing the above plan  of care; more than 50% of time spent on prognosis/ natural history; counseling and coordination.

## 2022-06-21 ENCOUNTER — Other Ambulatory Visit (HOSPITAL_COMMUNITY): Payer: Self-pay | Admitting: Radiology

## 2022-06-21 DIAGNOSIS — C3411 Malignant neoplasm of upper lobe, right bronchus or lung: Secondary | ICD-10-CM

## 2022-06-21 NOTE — H&P (Signed)
Chief Complaint: Patient was seen in consultation today for metastatic lung cancer  at the request of Brahmanday,Govinda R  Referring Physician(s): Earna Coder  Supervising Physician: Mir, Mauri Reading  Patient Status: [facility] - Out-pt  History of Present Illness: Stacie Kennedy is a 61 y.o. female with stage IV metastatic lung cancer followed by oncology. Pt currently receiving treatment for lung cancer. Pt PET scan 06/16/22 showed two hypermetabolic left hepatic lobe metastasis. Pt referred to IR for liver lesion biopsy. Procedure approved by Dr. Milford Cage, IR.   Past Medical History:  Diagnosis Date   Cancer [facility])    lung cancer   Depression    Duodenitis    Dyspnea    Family history of cancer    High cholesterol    Personal history of colonic polyps    Pre-diabetes    Umbilical hernia    UTI (urinary tract infection)     Past Surgical History:  Procedure Laterality Date   COLONOSCOPY WITH PROPOFOL N/A 03/24/2021   Procedure: COLONOSCOPY WITH PROPOFOL;  Surgeon: Wyline Mood, MD;  Location: [facility] ENDOSCOPY;  Service: Gastroenterology;  Laterality: N/A;   INTERCOSTAL NERVE BLOCK  09/19/2020   Procedure: INTERCOSTAL NERVE BLOCK;  Surgeon: Loreli Slot, MD;  Location: [facility] OR;  Service: Thoracic;;   IR IMAGING GUIDED PORT INSERTION  09/23/2021   LUNG LOBECTOMY Right    LUNG REMOVAL, PARTIAL Right 09/19/2020   NODE DISSECTION Right 09/19/2020   Procedure: NODE DISSECTION;  Surgeon: Loreli Slot, MD;  Location: [facility] OR;  Service: Thoracic;  Laterality: Right;   VIDEO BRONCHOSCOPY N/A 07/08/2021   Procedure: VIDEO BRONCHOSCOPY;  Surgeon: Loreli Slot, MD;  Location: [facility] OR;  Service: Thoracic;  Laterality: N/A;    Allergies: Patient has no known allergies.  Medications: Prior to Admission medications   Medication Sig Start Date End Date Taking? Authorizing Provider  albuterol (PROVENTIL HFA) 108 (90 Base) MCG/ACT inhaler Inhale 2 puffs into the lungs  once every 6 (six) hours as needed for wheezing or shortness of breath. 01/18/22   Earna Coder, MD  buPROPion (WELLBUTRIN SR) 150 MG 12 hr tablet Take 1 tablet (150 mg total) by mouth 2 (two) times daily. Patient not taking: Reported on 06/18/2022 06/23/21   Iloabachie, Chioma E, NP  lidocaine (LIDODERM) 5 % Place 1 patch onto the skin daily. Remove & Discard patch within 12 hours or as directed by MD 06/18/22   Earna Coder, MD  lidocaine-prilocaine (EMLA) cream Apply one application topically the the affected area(s) daily as needed. 08/31/21   Earna Coder, MD  loratadine (CLARITIN) 10 MG tablet Take 1 tablet (10 mg total) by mouth daily. Patient not taking: Reported on 06/18/2022 10/16/21   Earna Coder, MD  meloxicam (MOBIC) 7.5 MG tablet Take 1 tablet (7.5 mg total) by mouth 2 (two) times daily with food. Patient taking differently: Take 7.5 mg by mouth 2 (two) times daily as needed. 04/27/22     metFORMIN (GLUCOPHAGE) 500 MG tablet TAKE ONE TABLET BY MOUTH EVERY MORNING WITH BREAKFAST Patient not taking: Reported on 06/18/2022 01/18/22   Earna Coder, MD  morphine (MS CONTIN) 30 MG 12 hr tablet Take 1 tablet (30 mg total) by mouth every 8 (eight) hours. 06/01/22   Earna Coder, MD  nitrofurantoin, macrocrystal-monohydrate, (MACROBID) 100 MG capsule Take 1 capsule (100 mg total) by mouth 2 (two) times daily. Patient not taking: Reported on 06/18/2022 04/20/22   Earna Coder, MD  ondansetron [facility])  4 MG tablet TAKE 2 TABLETS (8MG ) BY MOUTH ONCE EVERY 8 HOURS AS NEEDED FOR NAUSEA OR VOMITING. 08/31/21   Earna Coder, MD  oxyCODONE-acetaminophen (PERCOCET) 10-325 MG tablet Take 1 tablet by mouth every 8 (eight) hours as needed for pain. 06/16/22   Earna Coder, MD  prochlorperazine (COMPAZINE) 10 MG tablet Take 1 tablet (10 mg total) by mouth once every 6 (six) hours as needed for nausea or vomiting. 08/31/21   Earna Coder,  MD  rosuvastatin (CRESTOR) 10 MG tablet TAKE ONE TABLET BY MOUTH EVERY DAY Patient not taking: Reported on 06/18/2022 07/14/21 07/14/22  Rolm Gala, NP     Family History  Problem Relation Age of Onset   Diabetes Mother    Cancer Mother    Diabetes Father    Stroke Father    Heart attack Father        59s   Healthy Sister    Cancer Brother    Hepatitis Brother    Diabetes Brother    Hypertension Brother    Heart disease Brother     Social History   Socioeconomic History   Marital status: Single    Spouse name: Not on file   Number of children: Not on file   Years of education: Not on file   Highest education level: Not on file  Occupational History   Not on file  Tobacco Use   Smoking status: Some Days    Packs/day: 0.25    Years: 43.00    Total pack years: 10.75    Types: Cigarettes   Smokeless tobacco: Never   Tobacco comments:    states she is slowing, trying to quit  Vaping Use   Vaping Use: Never used  Substance and Sexual Activity   Alcohol use: Yes    Alcohol/week: 2.0 standard drinks of alcohol    Types: 2 Cans of beer per week    Comment: occasional   Drug use: No   Sexual activity: Not on file  Other Topics Concern   Not on file  Social History Narrative   Lives in [place]; smokes; now and then beer; with boy friend. 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    Social Determinants of Health   Financial Resource Strain: High Risk (04/22/2022)   Overall Financial Resource Strain (CARDIA)    Difficulty of Paying Living Expenses: Hard  Food Insecurity: Food Insecurity Present (07/09/2021)   Hunger Vital Sign    Worried About Running Out of Food in the Last Year: Sometimes true    Ran Out of Food in the Last Year: Sometimes true  Transportation Needs: No Transportation Needs (07/09/2021)   PRAPARE - Administrator, Civil Service (Medical): No    Lack of Transportation (Non-Medical): No  Physical Activity:  Insufficiently Active (04/16/2021)   Exercise Vital Sign    Days of Exercise per Week: 7 days    Minutes of Exercise per Session: 20 min  Stress: Stress Concern Present (04/22/2022)   [employer] of Occupational Health - Occupational Stress Questionnaire    Feeling of Stress : Very much  Social Connections: Socially Isolated (04/22/2022)   Social Connection and Isolation Panel [NHANES]    Frequency of Communication with Friends and Family: Once a week    Frequency of Social Gatherings with Friends and Family: Once a week    Attends Religious Services: Never    Database administrator or Organizations: No    Attends Banker  Meetings: Never    Marital Status: Living with partner    Review of Systems: A 12 point ROS discussed and pertinent positives are indicated in the HPI above.  All other systems are negative.  Review of Systems  Constitutional:  Positive for unexpected weight change. Negative for appetite change, chills and fever.  Respiratory:  Negative for shortness of breath.   Cardiovascular:  Negative for chest pain and leg swelling.  Gastrointestinal:  Negative for abdominal pain, nausea and vomiting.  Musculoskeletal:  Positive for back pain.  Neurological:  Negative for dizziness and headaches.    Vital Signs: BP 124/72   Pulse (!) 54   Temp 97.6 F (36.4 C) (Oral)   Resp 11   Ht 5\' 7"  (1.702 m)   Wt 147 lb (66.7 kg)   SpO2 95%   BMI 23.02 kg/m     Physical Exam Vitals reviewed.  Constitutional:      General: She is not in acute distress.    Appearance: She is ill-appearing.  HENT:     Head: Normocephalic and atraumatic.     Mouth/Throat:     Mouth: Mucous membranes are dry.     Pharynx: Oropharynx is clear.  Eyes:     Extraocular Movements: Extraocular movements intact.     Pupils: Pupils are equal, round, and reactive to light.  Cardiovascular:     Rate and Rhythm: Regular rhythm. Bradycardia present.     Pulses: Normal pulses.      Heart sounds: Normal heart sounds. No murmur heard. Pulmonary:     Effort: Pulmonary effort is normal. No respiratory distress.     Breath sounds: Rhonchi present.     Comments: Rhonchi in all fields Abdominal:     General: Bowel sounds are normal. There is no distension.     Palpations: Abdomen is soft.     Tenderness: There is no abdominal tenderness. There is no guarding.  Musculoskeletal:     Right lower leg: No edema.     Left lower leg: No edema.  Skin:    General: Skin is warm and dry.     Comments: R upper chest port in place  Neurological:     Mental Status: She is alert and oriented to person, place, and time.  Psychiatric:        Mood and Affect: Mood normal.        Behavior: Behavior normal.        Thought Content: Thought content normal.        Judgment: Judgment normal.     Imaging: NM PET Image Restage (PS) Skull Base to Thigh (F-18 FDG)  Result Date: 06/16/2022 CLINICAL DATA:  Subsequent treatment strategy for tumor type. EXAM: NUCLEAR MEDICINE PET SKULL BASE TO THIGH TECHNIQUE: 8.3 mCi F-18 FDG was injected intravenously. Full-ring PET imaging was performed from the skull base to thigh after the radiotracer. CT data was obtained and used for attenuation correction and anatomic localization. Fasting blood glucose: 100 mg/dl COMPARISON:  CT 29/52/8413 FINDINGS: Mediastinal blood pool activity: SUV max 1.6 Liver activity: SUV max NA NECK: No hypermetabolic lymph nodes in the neck. Incidental CT findings: None. CHEST: No hypermetabolic mediastinal or hilar nodes. No suspicious pulmonary nodules on the CT scan. Band of benign atelectasis in the RIGHT middle lobe. Incidental CT findings: Port in the anterior chest wall with tip in distal SVC. ABDOMEN/PELVIS: Intense metabolic activity associated with a 4.3 x 3.3 cm lesion in the anterior LEFT hepatic lobe. SUV max equal  13.4 on image 131. This lesion os increased in size from 3.1 x 2.2 cm on comparison CT. A second lesion in the  posterolateral LEFT hepatic lobe adjacent to the IVC measuring 18 mm (image 117) with SUV max equal 6.3. This lesion appears similar in size to comparison CT. Incidental CT findings: Adrenal glands normal. No metastatic adenopathy. However uterus noted. SKELETON: Intense metabolic activity associated with lytic lesion within the body of the RIGHT scapula. Activity is intense with SUV max equal 9.3. There is extension into the adjacent musculature posteriorly (image 69/series 2) Incidental CT findings: None. IMPRESSION: 1. Two hypermetabolic LEFT hepatic lobe metastasis. Anterior lesion is increased in size from comparison CT. 2. Intense hypermetabolic RIGHT scapular metastasis with soft tissue extension. 3. No new areas of metastatic disease. Electronically Signed   By: Genevive Bi M.D.   On: 06/16/2022 16:05    Labs:  CBC: Recent Labs    04/30/22 0858 05/21/22 0841 06/18/22 0905 06/22/22 0937  WBC 6.8 7.9 6.5 6.8  HGB 14.6 14.9 14.6 15.0  HCT 44.6 43.5 44.2 45.2  PLT 295 280 307 320    COAGS: Recent Labs    06/22/22 0937  INR 1.0    BMP: Recent Labs    04/16/22 0818 04/30/22 0858 05/21/22 0841 06/18/22 0905  NA 138 136 136 137  K 3.4* 3.6 3.3* 3.7  CL 107 105 103 106  CO2 26 26 25 27   GLUCOSE 102* 102* 130* 86  BUN 15 9 9 16   CALCIUM 8.5* 9.3 8.6* 8.8*  CREATININE 0.61 0.58 0.52 0.51  GFRNONAA >60 >60 >60 >60    LIVER FUNCTION TESTS: Recent Labs    04/16/22 0818 04/30/22 0858 05/21/22 0841 06/18/22 0905  BILITOT 0.4 0.3 0.3 0.2*  AST 15 17 14* 15  ALT 9 8 8 7   ALKPHOS 80 78 62 74  PROT 7.3 6.7 7.1 7.0  ALBUMIN 3.8 3.6 3.7 3.7    TUMOR MARKERS: No results for input(s): "AFPTM", "CEA", "CA199", "CHROMGRNA" in the last 8760 hours.  Assessment and Plan: 61 yo female with history of metastatic lung cancer presents to IR today for liver lesion biopsy approved by Dr. Milford Cage.   Pt resting on stretcher.  She is A&O. Pt is tearful from left scapular, arm  pain.  She states she is unable to be moved or repositioned.  Pt is NPO per order.  No thinners on OP medication list.    Risks and benefits of liver lesion biopsy with moderate sedation  was discussed with the patient and/or patient's family including, but not limited to bleeding, infection, damage to adjacent structures or low yield requiring additional tests.  All of the questions were answered and there is agreement to proceed.  Consent signed and in chart.   Thank you for this interesting consult.  I greatly enjoyed meeting Stacie Kennedy and look forward to participating in their care.  A copy of this report was sent to the requesting provider on this date.  Electronically Signed: Shon Hough, NP 06/22/2022, 10:17 AM   I spent a total of 20 minutes in face to face in clinical consultation, greater than 50% of which was counseling/coordinating care for hepatic mass.

## 2022-06-21 NOTE — Telephone Encounter (Signed)
PA denied (scanned in chart).  Will inform MD.

## 2022-06-21 NOTE — Telephone Encounter (Signed)
Dr. B would like to file an appeals on denial.  [place]ott [phone]) to file an appeal and was informed that patient will need to sign the appeal request form  Once form is signed it can be faxed back with supporting documentation to [phone] or mailed to supplied address on denial letter.  Attempt to call patient requesting she come by the office to sign appeal request form but no answer and no voicemail available.  Once patient signs form will write reason for appeal in letter section of chart (Patient diagnosed with stage IV lung cancer with RIGHT scapular metastasis.  Right shoulder pain is not relieved by Oxycodone-Acetametaphine 10/325 mg and Meloxicam 7.5 mg)to fax with appeal form.

## 2022-06-21 NOTE — Telephone Encounter (Signed)
Spoke to patient and she will come by the office in the morning to sign form

## 2022-06-22 ENCOUNTER — Other Ambulatory Visit: Payer: Self-pay | Admitting: Internal Medicine

## 2022-06-22 ENCOUNTER — Ambulatory Visit
Admission: RE | Admit: 2022-06-22 | Discharge: 2022-06-22 | Disposition: A | Discharge status: Home / Self Care | Payer: Medicaid Other | Source: Ambulatory Visit | Attending: Internal Medicine | Admitting: Internal Medicine

## 2022-06-22 ENCOUNTER — Telehealth: Payer: Self-pay | Admitting: *Deleted

## 2022-06-22 ENCOUNTER — Other Ambulatory Visit: Payer: Self-pay

## 2022-06-22 ENCOUNTER — Encounter: Payer: Self-pay | Admitting: Internal Medicine

## 2022-06-22 DIAGNOSIS — C3411 Malignant neoplasm of upper lobe, right bronchus or lung: Secondary | ICD-10-CM | POA: Insufficient documentation

## 2022-06-22 DIAGNOSIS — R160 Hepatomegaly, not elsewhere classified: Secondary | ICD-10-CM | POA: Diagnosis not present

## 2022-06-22 DIAGNOSIS — K769 Liver disease, unspecified: Secondary | ICD-10-CM | POA: Diagnosis not present

## 2022-06-22 LAB — PROTIME-INR
INR: 1 (ref 0.8–1.2)
Prothrombin Time: 12.8 seconds (ref 11.4–15.2)

## 2022-06-22 LAB — CBC
HCT: 45.2 % (ref 36.0–46.0)
Hemoglobin: 15 g/dL (ref 12.0–15.0)
MCH: 29 pg (ref 26.0–34.0)
MCHC: 33.2 g/dL (ref 30.0–36.0)
MCV: 87.3 fL (ref 80.0–100.0)
Platelets: 320 10*3/uL (ref 150–400)
RBC: 5.18 MIL/uL — ABNORMAL HIGH (ref 3.87–5.11)
RDW: 16.9 % — ABNORMAL HIGH (ref 11.5–15.5)
WBC: 6.8 10*3/uL (ref 4.0–10.5)
nRBC: 0 % (ref 0.0–0.2)

## 2022-06-22 MED ORDER — FENTANYL CITRATE (PF) 100 MCG/2ML IJ SOLN
INTRAMUSCULAR | Status: AC | PRN
  Administered 2022-06-22: 50 ug via INTRAVENOUS
  Administered 2022-06-22 (×2): 25 ug via INTRAVENOUS

## 2022-06-22 MED ORDER — MIDAZOLAM HCL 2 MG/2ML IJ SOLN
INTRAMUSCULAR | Status: AC | PRN
  Administered 2022-06-22: 1 mg via INTRAVENOUS

## 2022-06-22 MED ORDER — SODIUM CHLORIDE 0.9 % IV SOLN
INTRAVENOUS | Status: DC
  Administered 2022-06-22: 1000 mL via INTRAVENOUS

## 2022-06-22 MED ORDER — MIDAZOLAM HCL 5 MG/5ML IJ SOLN
INTRAMUSCULAR | Status: AC | PRN
  Administered 2022-06-22: 1 mg via INTRAVENOUS

## 2022-06-22 MED ORDER — MIDAZOLAM HCL 2 MG/2ML IJ SOLN
INTRAMUSCULAR | Status: AC
  Filled 2022-06-22: qty 2

## 2022-06-22 MED ORDER — FENTANYL CITRATE (PF) 100 MCG/2ML IJ SOLN
INTRAMUSCULAR | Status: AC
  Filled 2022-06-22: qty 2

## 2022-06-22 MED ORDER — FENTANYL 75 MCG/HR TD PT72
1.0000 | MEDICATED_PATCH | TRANSDERMAL | 0 refills | Status: DC
  Filled 2022-06-22 – 2022-06-23 (×4): qty 10, 30d supply, fill #0

## 2022-06-22 NOTE — Progress Notes (Signed)
Patient called for poor pain control-patient discontinued MS Contin.  Recommend starting fentanyl patch 75 mcg every 3 days; continue oxycodone as ordered.   Hayley please inform patient-

## 2022-06-22 NOTE — Procedures (Signed)
Interventional Radiology Procedure Note  Procedure: US guided left liver mass biopsy  Indication: Metastatic lung ca  Findings: Please refer to procedural dictation for full description.  Complications: None  EBL: < 10 mL  Miachel Roux, MD [phone]

## 2022-06-22 NOTE — Telephone Encounter (Signed)
Phone call made to patient to review upcoming appt with Dr. Baruch Gouty on 9/19 at 9:30am. Pt requested an appt sooner due to increasing pain. Pt stated that her current pain meds with percocet and lidoderm are not helping relieve her pain. States that she tried MS Contin 30mg  but stopped taking them because she states that did not help with her pain. Asked pt if she felt like her pain medication needed to be adjusted. Pt is agreeable to adjusting her pain meds if Dr. B recommends. Informed pt that will try to get her an appt with Dr. Baruch Gouty sooner and ask Dr. B regarding her pain medications.

## 2022-06-22 NOTE — Telephone Encounter (Signed)
Appeal request faxed

## 2022-06-22 NOTE — Progress Notes (Signed)
Patient clinically stable post Liver biopsy per Dr Dwaine Gale, tolerated well. Vitals stable pre and post procedure. Received Versed 2 mg along with Fentanyl 100 mcg IV for procedure. Report given to Genelle Bal RN post procedure/specials.

## 2022-06-23 ENCOUNTER — Encounter: Payer: Self-pay | Admitting: Internal Medicine

## 2022-06-23 ENCOUNTER — Other Ambulatory Visit: Payer: Self-pay

## 2022-06-23 NOTE — Progress Notes (Signed)
Pt made aware

## 2022-06-23 NOTE — Telephone Encounter (Signed)
Pt made aware

## 2022-06-24 ENCOUNTER — Other Ambulatory Visit: Payer: Self-pay

## 2022-06-24 LAB — SURGICAL PATHOLOGY

## 2022-06-25 ENCOUNTER — Encounter: Payer: Self-pay | Admitting: *Deleted

## 2022-06-25 NOTE — Progress Notes (Signed)
Per Dr. Rogue Bussing foundation one + PDL1 Beryle Flock) needs to be ordered on recent liver biopsy. Request for foundation one faxed to pathology to send out.

## 2022-06-28 ENCOUNTER — Ambulatory Visit: Payer: Medicaid Other | Admitting: Internal Medicine

## 2022-06-29 ENCOUNTER — Inpatient Hospital Stay (HOSPITAL_BASED_OUTPATIENT_CLINIC_OR_DEPARTMENT_OTHER): Payer: Medicaid Other | Admitting: Internal Medicine

## 2022-06-29 ENCOUNTER — Ambulatory Visit
Admission: RE | Admit: 2022-06-29 | Discharge: 2022-06-29 | Disposition: A | Discharge status: Home / Self Care | Payer: Medicaid Other | Source: Ambulatory Visit | Attending: Radiation Oncology | Admitting: Radiation Oncology

## 2022-06-29 ENCOUNTER — Inpatient Hospital Stay: Payer: Medicaid Other

## 2022-06-29 VITALS — Temp 97.4°F | Resp 18 | Ht 67.0 in | Wt 146.0 lb

## 2022-06-29 DIAGNOSIS — Z510 Encounter for antineoplastic radiation therapy: Secondary | ICD-10-CM | POA: Diagnosis not present

## 2022-06-29 DIAGNOSIS — C7B8 Other secondary neuroendocrine tumors: Secondary | ICD-10-CM | POA: Insufficient documentation

## 2022-06-29 DIAGNOSIS — C3411 Malignant neoplasm of upper lobe, right bronchus or lung: Secondary | ICD-10-CM

## 2022-06-29 DIAGNOSIS — C7A1 Malignant poorly differentiated neuroendocrine tumors: Secondary | ICD-10-CM | POA: Insufficient documentation

## 2022-06-29 LAB — CBC WITH DIFFERENTIAL/PLATELET
Abs Immature Granulocytes: 0.01 10*3/uL (ref 0.00–0.07)
Basophils Absolute: 0 10*3/uL (ref 0.0–0.1)
Basophils Relative: 1 %
Eosinophils Absolute: 0.1 10*3/uL (ref 0.0–0.5)
Eosinophils Relative: 2 %
HCT: 47.3 % — ABNORMAL HIGH (ref 36.0–46.0)
Hemoglobin: 15.6 g/dL — ABNORMAL HIGH (ref 12.0–15.0)
Immature Granulocytes: 0 %
Lymphocytes Relative: 39 %
Lymphs Abs: 2.4 10*3/uL (ref 0.7–4.0)
MCH: 29.1 pg (ref 26.0–34.0)
MCHC: 33 g/dL (ref 30.0–36.0)
MCV: 88.2 fL (ref 80.0–100.0)
Monocytes Absolute: 0.5 10*3/uL (ref 0.1–1.0)
Monocytes Relative: 9 %
Neutro Abs: 3.1 10*3/uL (ref 1.7–7.7)
Neutrophils Relative %: 49 %
Platelets: 325 10*3/uL (ref 150–400)
RBC: 5.36 MIL/uL — ABNORMAL HIGH (ref 3.87–5.11)
RDW: 16.9 % — ABNORMAL HIGH (ref 11.5–15.5)
WBC: 6.1 10*3/uL (ref 4.0–10.5)
nRBC: 0 % (ref 0.0–0.2)

## 2022-06-29 LAB — COMPREHENSIVE METABOLIC PANEL
ALT: 9 U/L (ref 0–44)
AST: 17 U/L (ref 15–41)
Albumin: 4.1 g/dL (ref 3.5–5.0)
Alkaline Phosphatase: 85 U/L (ref 38–126)
Anion gap: 5 (ref 5–15)
BUN: 16 mg/dL (ref 6–20)
CO2: 24 mmol/L (ref 22–32)
Calcium: 9 mg/dL (ref 8.9–10.3)
Chloride: 107 mmol/L (ref 98–111)
Creatinine, Ser: 0.58 mg/dL (ref 0.44–1.00)
GFR, Estimated: >60 mL/min (ref >60)
Glucose, Bld: 106 mg/dL — ABNORMAL HIGH (ref 70–99)
Potassium: 3.8 mmol/L (ref 3.5–5.1)
Sodium: 136 mmol/L (ref 135–145)
Total Bilirubin: 0.4 mg/dL (ref 0.3–1.2)
Total Protein: 7.8 g/dL (ref 6.5–8.1)

## 2022-06-29 NOTE — Progress Notes (Signed)
Patient here today for follow up visit regarding lung cancer. Patient saw Dr. Baruch Gouty this morning, CT sim on 9/21. Patient reports pain 5/10 this morning, taking Percocet every 8 hours and Fentanyl patch 75 mcg in place.

## 2022-06-29 NOTE — Progress Notes (Signed)
Radiation Oncology Follow up Note old patient new area retreatment of scapula right  Name: Stacie Kennedy   Date:   06/29/2022 MRN:  [id number] DOB: 1960/12/21    This 61 y.o. 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  REFERRING PROVIDER: Cammie Sickle, *  HPI: Patient is a 61 year old female well-known to our department having been treated back in December 2022 for.  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  She received 30 Gray in 10 fractions.  She has been having increasing pain in that area as well as right upper extremity pain.  Recent PET CT scan shows progression of disease in her liver as well as new hypermetabolic lesion in her right scapula with intense hypermetabolic activity as well as soft tissue extension.  No evidence of lesion in her right humeral region is or right shoulder is noted.  She is currently on palliative immunotherapy with Tecentriq.  She is currently on narcotic analgesics.  COMPLICATIONS OF TREATMENT: none  FOLLOW UP COMPLIANCE: keeps appointments   PHYSICAL EXAM:  Temp (!) 97.4 F (36.3 C)   Resp 18   Ht 5\' 7"  (1.702 m)   Wt 146 lb (66.2 kg)   BMI 22.87 kg/m  Patient has pain on any movement of the right upper extremity.  Also with palpation of her posterior right scapula.  Well-developed well-nourished patient in NAD. HEENT reveals PERLA, EOMI, discs not visualized.  Oral cavity is clear. No oral mucosal lesions are identified. Neck is clear without evidence of cervical or supraclavicular adenopathy. Lungs are clear to A&P. Cardiac examination is essentially unremarkable with regular rate and rhythm without murmur rub or thrill. Abdomen is benign with no organomegaly or masses noted. Motor sensory and DTR levels are equal and symmetric in the upper  and lower extremities. Cranial nerves II through XII are grossly intact. Proprioception is intact. No peripheral adenopathy or edema is identified. No motor or sensory levels are noted. Crude visual fields are within normal range.  RADIOLOGY RESULTS: PET CT scan reviewed compatible with above-stated findings  PLAN: At this time I would like to go ahead with SBRT to her right scapula.  I would choose SBRT based on this being a retreated area.  We will plan on delivering 30 Gray in 5 fractions.  We use PET/CT for tumor localization.  Risks and benefits of treatment clinic skin reaction possible further scarring based on retreatment of area fatigue all were discussed in detail with the patient.  Have personally set up and ordered CT simulation.  Patient comprehends my recommendations well.  I would like to take this opportunity to thank you for allowing me to participate in the care of your patient.Noreene Filbert, MD

## 2022-06-29 NOTE — Progress Notes (Signed)
[place] CONSULT NOTE  Patient Care Team: Cammie Sickle, MD as PCP - General (Internal Medicine) Telford Nab, RN as Oncology Nurse Navigator  CHIEF COMPLAINTS/PURPOSE OF CONSULTATION: lung cancer  #  Oncology History Overview Note  # NOV -[id number] [facility] CANCER SCREENING PROGRAM]-18 mm right upper lobe lung nodule; DEC 2021- s/p right upper lobectomy [Dr. Roxan Hockey; [place]]; STAGE: I [pT-18 mm; LN-12=0]; predominant large cell neuroendocrine; minor adenocarcinoma.  Declines adjuvant chemotherapy.  #Recurrent/stage IV cancer NOV 2022- PET scan-scapular lesion liver lesion gastrohepatic lymphadenopathy.  11/21- start RT to right scapular lesion  # DEC 3rd, 2022- CARBO=ETOP+TECEN; udenyca #1.   # # MAY 2023- Right swollen eye/orbital inflammation/uveitis s/p Zometa status post steroids improved.-reviewed literature case reports noted; less concerning for tumor involvement.  DISCONTINUE ZOMETA.  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  Visceral Pleura Invasion: Not identified  Direct Invasion of Adjacent Structures: No adjacent structures present  Lymphovascular Invasion: Not identified  Margins: All margins negative for invasive carcinoma       Closest Margin(s) to Invasive Carcinoma: Bronchovascular margin  Treatment Effect: No known presurgical therapy  Regional Lymph Nodes:       Number of Lymph Nodes Involved: 0                            Nodal Sites with Tumor: Not applicable       Number of Lymph Nodes Examined: 12      Primary cancer of right upper lobe of lung ([place])  11/03/2020 Initial Diagnosis   Primary cancer  of right upper lobe of lung ([place])   11/03/2020 Cancer Staging   Staging form: Lung, AJCC 8th Edition - Pathologic: Stage IA3 (pT1c, pN0, cM0) - Signed by Cammie Sickle, MD on 11/04/2020   09/14/2021 - 04/30/2022 Chemotherapy   Patient is on Treatment Plan : LUNG SCLC Carboplatin + Etoposide + Atezolizumab Induction q21d / Atezolizumab Maintenance q21d     09/15/2021 -  Chemotherapy   Patient is on Treatment Plan : LUNG SCLC Carboplatin + Etoposide + Atezolizumab Induction q21d x 4 cycles / Atezolizumab Maintenance q21d     11/09/2021 Cancer Staging   Staging form: Lung, AJCC 8th Edition - Pathologic: Stage IVB (pTX, pNX, cM1c) - Signed by Cammie Sickle, MD on 11/09/2021   06/22/2022 Pathology Results   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  Alone.   Stacie Kennedy 61 y.o.  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  She reports pain in her right shoulder.  She has been using fentanyl patch.  Reports Percocet helps only for 4 hours. Otherwise denies fever, chills, nausea or vomiting, bowel or bladder issues.  I discussed about the liver biopsy results consistent with progression of the cancer.  Understandably patient was tearful.   Review of Systems  Constitutional:  Negative for chills, diaphoresis, fever, malaise/fatigue and weight loss.  HENT:  Negative for nosebleeds and sore throat.   Eyes:  Negative for double vision.  Respiratory:  Negative for cough, hemoptysis, sputum production, shortness of breath and wheezing.   Cardiovascular:  Negative for chest pain, palpitations, orthopnea and leg swelling.  Gastrointestinal:  Negative for abdominal pain, blood in stool, constipation, diarrhea, heartburn, melena, nausea and vomiting.  Genitourinary:  Negative for dysuria, frequency  and urgency.  Musculoskeletal:  Positive for joint pain.  Skin: Negative.  Negative for itching and rash.  Neurological:  Negative for dizziness, tingling, focal weakness, weakness and headaches.  Endo/Heme/Allergies:  Does not bruise/bleed easily.  Psychiatric/Behavioral:  Negative for depression. The patient is not nervous/anxious and does not have insomnia.      MEDICAL HISTORY:  Past Medical History:  Diagnosis Date   Cancer [facility])    lung cancer   Depression    Duodenitis    Dyspnea    Family history of cancer    High cholesterol    Personal history of colonic polyps    Pre-diabetes    Umbilical hernia    UTI (urinary tract infection)     SURGICAL HISTORY: Past Surgical History:  Procedure Laterality Date   COLONOSCOPY WITH PROPOFOL N/A 03/24/2021   Procedure: COLONOSCOPY WITH PROPOFOL;  Surgeon: Jonathon Bellows, MD;  Location: [facility] ENDOSCOPY;  Service: Gastroenterology;  Laterality: N/A;   INTERCOSTAL NERVE BLOCK  09/19/2020   Procedure: INTERCOSTAL NERVE BLOCK;  Surgeon: Melrose Nakayama, MD;  Location: [place];  Service: Thoracic;;   IR IMAGING GUIDED PORT INSERTION  09/23/2021   LUNG LOBECTOMY Right    LUNG REMOVAL, PARTIAL Right 09/19/2020   NODE DISSECTION Right 09/19/2020   Procedure: NODE DISSECTION;  Surgeon: Melrose Nakayama, MD;  Location: [place];  Service: Thoracic;  Laterality: Right;   VIDEO BRONCHOSCOPY N/A 07/08/2021   Procedure: VIDEO BRONCHOSCOPY;  Surgeon: Melrose Nakayama, MD;  Location: [facility] OR;  Service: Thoracic;  Laterality: N/A;    SOCIAL HISTORY: Social History   Socioeconomic History   Marital status: Single    Spouse name: Not on file   Number of children: Not on file   Years of education: Not on file   Highest education level: Not on file  Occupational History   Not on file  Tobacco Use   Smoking status: Some Days    Packs/day: 0.25    Years: 43.00    Total pack years: 10.75    Types: Cigarettes   Smokeless tobacco: Never    Tobacco comments:    states she is slowing, trying to quit  Vaping Use   Vaping Use: Never used  Substance and Sexual Activity   Alcohol use: Yes    Alcohol/week: 2.0 standard drinks of alcohol    Types: 2 Cans of beer per week    Comment: occasional   Drug use: No   Sexual activity: Not on file  Other Topics Concern   Not on file  Social History Narrative   Lives in [place]; smokes; now and then beer; with boy friend. 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    Social Determinants of Health   Financial Resource Strain: High Risk (04/22/2022)   Overall Financial Resource Strain (CARDIA)    Difficulty of Paying Living Expenses: Hard  Food Insecurity: Food Insecurity Present (07/09/2021)   Hunger Vital Sign    Worried About Running Out of Food in the Last Year: Sometimes true    Ran Out of Food in the Last Year: Sometimes true  Transportation Needs: No Transportation Needs (07/09/2021)   [place] - Transportation  Lack of Transportation (Medical): No    Lack of Transportation (Non-Medical): No  Physical Activity: Insufficiently Active (04/16/2021)   Exercise Vital Sign    Days of Exercise per Week: 7 days    Minutes of Exercise per Session: 20 min  Stress: Stress Concern Present (04/22/2022)   [place]    Feeling of Stress : Very much  Social Connections: Socially Isolated (04/22/2022)   Social Connection and Isolation Panel [NHANES]    Frequency of Communication with Friends and Family: Once a week    Frequency of Social Gatherings with Friends and Family: Once a week    Attends Religious Services: Never    Marine scientist or Organizations: No    Attends Archivist Meetings: Never    Marital Status: Living with partner  Intimate Partner Violence: At Risk (04/22/2022)   Humiliation, Afraid, Rape, and Kick questionnaire    Fear of Current or Ex-Partner: No    Emotionally Abused:  Yes    Physically Abused: No    Sexually Abused: No    FAMILY HISTORY: Family History  Problem Relation Age of Onset   Diabetes Mother    Cancer Mother    Diabetes Father    Stroke Father    Heart attack Father        77s   Healthy Sister    Cancer Brother    Hepatitis Brother    Diabetes Brother    Hypertension Brother    Heart disease Brother     ALLERGIES:  has No Known Allergies.  MEDICATIONS:  Current Outpatient Medications  Medication Sig Dispense Refill   albuterol (PROVENTIL HFA) 108 (90 Base) MCG/ACT inhaler Inhale 2 puffs into the lungs once every 6 (six) hours as needed for wheezing or shortness of breath. 6.7 g 2   fentaNYL (DURAGESIC) 75 MCG/HR Place 1 patch onto the skin every 3 (three) days. 10 patch 0   lidocaine-prilocaine (EMLA) cream Apply one application topically the the affected area(s) daily as needed. 30 g 3   oxyCODONE-acetaminophen (PERCOCET) 10-325 MG tablet Take 1 tablet by mouth every 8 (eight) hours as needed for pain. 60 tablet 0   prochlorperazine (COMPAZINE) 10 MG tablet Take 1 tablet (10 mg total) by mouth once every 6 (six) hours as needed for nausea or vomiting. 40 tablet 1   buPROPion (WELLBUTRIN SR) 150 MG 12 hr tablet Take 1 tablet (150 mg total) by mouth 2 (two) times daily. (Patient not taking: Reported on 06/18/2022) 60 tablet 0   lidocaine (LIDODERM) 5 % Place 1 patch onto the skin daily. Remove & Discard patch within 12 hours or as directed by MD (Patient not taking: Reported on 06/29/2022) 30 patch 2   loratadine (CLARITIN) 10 MG tablet Take 1 tablet (10 mg total) by mouth daily. (Patient not taking: Reported on 06/18/2022) 30 tablet 2   meloxicam (MOBIC) 7.5 MG tablet Take 1 tablet (7.5 mg total) by mouth 2 (two) times daily with food. (Patient not taking: Reported on 06/29/2022) 30 tablet 0   metFORMIN (GLUCOPHAGE) 500 MG tablet TAKE ONE TABLET BY MOUTH EVERY MORNING WITH BREAKFAST (Patient not taking: Reported on 06/18/2022) 90 tablet 3    nitrofurantoin, macrocrystal-monohydrate, (MACROBID) 100 MG capsule Take 1 capsule (100 mg total) by mouth 2 (two) times daily. (Patient not taking: Reported on 06/18/2022) 14 capsule 0   ondansetron (ZOFRAN) 4 MG tablet TAKE 2 TABLETS (8MG) BY MOUTH ONCE EVERY 8 HOURS AS  NEEDED FOR NAUSEA OR VOMITING. (Patient not taking: Reported on 06/29/2022) 80 tablet 1   rosuvastatin (CRESTOR) 10 MG tablet TAKE ONE TABLET BY MOUTH EVERY DAY (Patient not taking: Reported on 06/18/2022) 30 tablet 3   No current facility-administered medications for this visit.   Facility-Administered Medications Ordered in Other Visits  Medication Dose Route Frequency Provider Last Rate Last Admin   heparin lock flush 100 unit/mL  500 Units Intravenous Once Charlaine Dalton R, MD       morphine (PF) 2 MG/ML injection            sodium chloride flush (NS) 0.9 % injection 10 mL  10 mL Intravenous PRN Cammie Sickle, MD   10 mL at 03/01/22 0857      .  PHYSICAL EXAMINATION: ECOG PERFORMANCE STATUS: 0 - Asymptomatic  There were no vitals filed for this visit.        There were no vitals filed for this visit.         Physical Exam HENT:     Head: Normocephalic and atraumatic.     Mouth/Throat:     Pharynx: No oropharyngeal exudate.  Eyes:     Pupils: Pupils are equal, round, and reactive to light.  Cardiovascular:     Rate and Rhythm: Normal rate and regular rhythm.  Pulmonary:     Effort: Pulmonary effort is normal. No respiratory distress.     Breath sounds: Normal breath sounds. No wheezing.  Abdominal:     General: Bowel sounds are normal. There is no distension.     Palpations: Abdomen is soft. There is no mass.     Tenderness: There is no abdominal tenderness. There is no guarding or rebound.  Musculoskeletal:        General: No tenderness. Normal range of motion.     Cervical back: Normal range of motion and neck supple.  Skin:    General: Skin is warm.  Neurological:     Mental  Status: She is alert and oriented to person, place, and time.  Psychiatric:        Mood and Affect: Affect normal.     LABORATORY DATA:  I have reviewed the data as listed Lab Results  Component Value Date   WBC 6.1 06/29/2022   HGB 15.6 (H) 06/29/2022   HCT 47.3 (H) 06/29/2022   MCV 88.2 06/29/2022   PLT 325 06/29/2022   Recent Labs    05/21/22 0841 06/18/22 0905 06/29/22 1048  NA 136 137 136  K 3.3* 3.7 3.8  CL 103 106 107  CO2 _0 GLUCOSE 130* 86 106*  BUN _1 CREATININE 0.52 0.51 0.58  CALCIUM 8.6* 8.8* 9.0  GFRNONAA >60 >60 >60  PROT 7.1 7.0 7.8  ALBUMIN 3.7 3.7 4.1  AST 14* 15 17  ALT _2 ALKPHOS 62 74 85  BILITOT 0.3 0.2* 0.4    RADIOGRAPHIC STUDIES: I have personally reviewed the radiological images as listed and agreed with the findings in the report. US BIOPSY (LIVER)  Result Date: 06/22/2022 INDICATION: 61 year old woman with history of metastatic lung cancer presents to IR for biopsy of left liver mass. EXAM: Ultrasound-guided biopsy of left liver mass MEDICATIONS: None. ANESTHESIA/SEDATION: Moderate (conscious) sedation was employed during this procedure. A total of Versed 2 mg and Fentanyl 100 mcg was administered intravenously by the radiology nurse. Total intra-service moderate Sedation Time: 16 minutes. The patient's level of consciousness and vital signs were monitored  continuously by radiology nursing throughout the procedure under my direct supervision. COMPLICATIONS: None immediate. PROCEDURE: Informed written consent was obtained from the patient after a thorough discussion of the procedural risks, benefits and alternatives. All questions were addressed. Maximal Sterile Barrier Technique was utilized including caps, mask, sterile gowns, sterile gloves, sterile drape, hand hygiene and skin antiseptic. A timeout was performed prior to the initiation of the procedure. Patient position supine on the ultrasound table. Right upper quadrant skin  prepped and draped in usual sterile fashion. Following local lidocaine administration, 17 gauge introducer needle was advanced into the right hepatic lobe, and 6- 18 gauge cores were obtained utilizing continuous ultrasound guidance. Gelfoam slurry was administered through the introducer needle at the biopsy site. Samples were sent to pathology in formalin. Needle removed and hemostasis achieved with 5 minutes of manual compression. Post procedure ultrasound images showed no evidence of significant hemorrhage. IMPRESSION: Ultrasound-guided biopsy of left liver mass seal Ding six 33 mm 18 gauge cores. Electronically Signed   By: Miachel Roux M.D.   On: 06/22/2022 12:00   NM PET Image Restage (PS) Skull Base to Thigh (F-18 FDG)  Result Date: 06/16/2022 CLINICAL DATA:  Subsequent treatment strategy for tumor type. EXAM: NUCLEAR MEDICINE PET SKULL BASE TO THIGH TECHNIQUE: 8.3 mCi F-18 FDG was injected intravenously. Full-ring PET imaging was performed from the skull base to thigh after the radiotracer. CT data was obtained and used for attenuation correction and anatomic localization. Fasting blood glucose: 100 mg/dl COMPARISON:  CT 03/16/2022 FINDINGS: Mediastinal blood pool activity: SUV max 1.6 Liver activity: SUV max NA NECK: No hypermetabolic lymph nodes in the neck. Incidental CT findings: None. CHEST: No hypermetabolic mediastinal or hilar nodes. No suspicious pulmonary nodules on the CT scan. Band of benign atelectasis in the RIGHT middle lobe. Incidental CT findings: Port in the anterior chest wall with tip in distal SVC. ABDOMEN/PELVIS: Intense metabolic activity associated with a 4.3 x 3.3 cm lesion in the anterior LEFT hepatic lobe. SUV max equal 13.4 on image 131. This lesion os increased in size from 3.1 x 2.2 cm on comparison CT. A second lesion in the posterolateral LEFT hepatic lobe adjacent to the IVC measuring 18 mm (image 117) with SUV max equal 6.3. This lesion appears similar in size to  comparison CT. Incidental CT findings: Adrenal glands normal. No metastatic adenopathy. However uterus noted. SKELETON: Intense metabolic activity associated with lytic lesion within the body of the RIGHT scapula. Activity is intense with SUV max equal 9.3. There is extension into the adjacent musculature posteriorly (image 69/series 2) Incidental CT findings: None. IMPRESSION: 1. Two hypermetabolic LEFT hepatic lobe metastasis. Anterior lesion is increased in size from comparison CT. 2. Intense hypermetabolic RIGHT scapular metastasis with soft tissue extension. 3. No new areas of metastatic disease. Electronically Signed   By: Suzy Bouchard M.D.   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 SEP 6th 2023-PET scan- Two hypermetabolic LEFT hepatic lobe metastasis. Anterior lesion is increased in size from comparison CT;  Intense hypermetabolic RIGHT scapular metastasis with soft tissue extension;  No new areas of metastatic disease.   Status post liver biopsy on 06/22/2022 which showed metastatic disease with neuroendocrine future consistent with progression.  NGS testing sent by Dr. Jacinto Reap. This was discussed with the patient. Hold off on Tecentriq maintenance.  She will follow-up  with Dr. B next week to discuss about next line of treatment.   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 Donella Stade today for reirradiation to the right shoulder for symptom control.  He is planning for CT sim this Thursday. She is using fentanyl patch 75 mics.  Also taking Percocet which she mentioned helps for 4 hours only.  So we discussed about increasing the dose to every 4 hours as needed. Emphasized to monitor for constipation and to be on bowel regimen.   # MAY 2023- Right swollen  eye/orbital inflammation/uveitis s/p Zometa status post steroids improved.-reviewed literature case reports noted; less concerning for tumor involvement.  DISCONTINUE ZOMETA. Consider X-geva-HOLD for now.    # constipation:  Miralax BID; fluids; Dulcolax- STABLE.   # DM:  On Metformin. STABLE.   # COPD: refilled albuterol;STABLE.   #IV access: port functional-  functional   # DISPOSITION: #cancel infusion appointment this Friday #1 week f/u with Dr. Jacinto Reap. No labs    All questions were answered. The patient knows to call the clinic with any problems, questions or concerns.    Jane Canary, MD 06/29/2022 12:31 PM

## 2022-06-30 ENCOUNTER — Encounter: Payer: Self-pay | Admitting: Internal Medicine

## 2022-07-01 ENCOUNTER — Ambulatory Visit
Admission: RE | Admit: 2022-07-01 | Discharge: 2022-07-01 | Disposition: A | Discharge status: Home / Self Care | Payer: Medicaid Other | Source: Ambulatory Visit | Attending: Radiation Oncology | Admitting: Radiation Oncology

## 2022-07-01 DIAGNOSIS — Z510 Encounter for antineoplastic radiation therapy: Secondary | ICD-10-CM | POA: Diagnosis not present

## 2022-07-02 ENCOUNTER — Ambulatory Visit: Payer: Medicaid Other | Admitting: Internal Medicine

## 2022-07-02 ENCOUNTER — Other Ambulatory Visit: Payer: Medicaid Other

## 2022-07-05 ENCOUNTER — Other Ambulatory Visit: Payer: Self-pay | Admitting: *Deleted

## 2022-07-05 ENCOUNTER — Encounter: Payer: Self-pay | Admitting: Internal Medicine

## 2022-07-05 ENCOUNTER — Other Ambulatory Visit: Payer: Self-pay

## 2022-07-05 ENCOUNTER — Inpatient Hospital Stay: Payer: Medicaid Other | Admitting: Internal Medicine

## 2022-07-05 ENCOUNTER — Encounter: Payer: Self-pay | Admitting: Oncology

## 2022-07-05 MED ORDER — OXYCODONE-ACETAMINOPHEN 10-325 MG PO TABS
1.0000 | ORAL_TABLET | Freq: Three times a day (TID) | ORAL | 0 refills | Status: DC | PRN
  Filled 2022-07-05: qty 60, 20d supply, fill #0

## 2022-07-05 NOTE — Progress Notes (Deleted)
Stacie Kennedy CONSULT NOTE  Patient Care Team: Cammie Sickle, MD as PCP - General (Internal Medicine) Telford Nab, RN as Oncology Nurse Navigator  CHIEF COMPLAINTS/PURPOSE OF CONSULTATION: lung cancer  #  Oncology History Overview Note  # NOV -[id number] [facility] CANCER SCREENING PROGRAM]-18 mm right upper lobe lung nodule; DEC 2021- s/p right upper lobectomy [Dr. Roxan Hockey; [place]]; STAGE: I [pT-18 mm; LN-12=0]; predominant large cell neuroendocrine; minor adenocarcinoma.  Declines adjuvant chemotherapy.  #Recurrent/stage IV cancer NOV 2022- PET scan-scapular lesion liver lesion gastrohepatic lymphadenopathy.  11/21- start RT to right scapular lesion  # DEC 3rd, 2022- CARBO=ETOP+TECEN; udenyca #1.   # # MAY 2023- Right swollen eye/orbital inflammation/uveitis s/p Zometa status post steroids improved.-reviewed literature case reports noted; less concerning for tumor involvement.  DISCONTINUE ZOMETA.  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  Visceral Pleura Invasion: Not identified  Direct Invasion of Adjacent Structures: No adjacent structures present  Lymphovascular Invasion: Not identified  Margins: All margins negative for invasive carcinoma       Closest Margin(s) to Invasive Carcinoma: Bronchovascular margin  Treatment Effect: No known presurgical therapy  Regional Lymph Nodes:       Number of Lymph Nodes Involved: 0                            Nodal Sites with Tumor: Not applicable       Number of Lymph Nodes Examined: 12      Primary cancer of right upper lobe of lung ([place])  11/03/2020 Initial Diagnosis   Primary cancer  of right upper lobe of lung ([place])   11/03/2020 Cancer Staging   Staging form: Lung, AJCC 8th Edition - Pathologic: Stage IA3 (pT1c, pN0, cM0) - Signed by Cammie Sickle, MD on 11/04/2020   09/14/2021 - 04/30/2022 Chemotherapy   Patient is on Treatment Plan : LUNG SCLC Carboplatin + Etoposide + Atezolizumab Induction q21d / Atezolizumab Maintenance q21d     09/15/2021 -  Chemotherapy   Patient is on Treatment Plan : LUNG SCLC Carboplatin + Etoposide + Atezolizumab Induction q21d x 4 cycles / Atezolizumab Maintenance q21d     11/09/2021 Cancer Staging   Staging form: Lung, AJCC 8th Edition - Pathologic: Stage IVB (pTX, pNX, cM1c) - Signed by Cammie Sickle, MD on 11/09/2021   06/22/2022 Pathology Results   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  Alone.   Stacie Kennedy 61 y.o.  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  Patient complains of worsening right shoulder pain.  Oxycodone has been changed to [facility] 07/13/2024 every 6-8 hours.  Patient continues to take MS Contin 30 mg twice a day. She complains of restricted movement of the right shoulder.  No nausea no vomiting.  No headaches. Chronic shortness of breath-not any worse.   Review of Systems  Constitutional:  Negative for chills, diaphoresis, fever, malaise/fatigue and weight loss.  HENT:  Negative for nosebleeds and  sore throat.   Eyes:  Negative for double vision.  Respiratory:  Negative for cough, hemoptysis, sputum production, shortness of breath and wheezing.   Cardiovascular:  Negative for chest pain, palpitations, orthopnea and leg swelling.  Gastrointestinal:  Negative for abdominal pain, blood in stool, constipation, diarrhea, heartburn, melena, nausea and vomiting.   Genitourinary:  Negative for dysuria, frequency and urgency.  Musculoskeletal:  Positive for back pain, joint pain and myalgias.  Skin: Negative.  Negative for itching and rash.  Neurological:  Negative for dizziness, tingling, focal weakness, weakness and headaches.  Endo/Heme/Allergies:  Does not bruise/bleed easily.  Psychiatric/Behavioral:  Negative for depression. The patient is not nervous/anxious and does not have insomnia.      MEDICAL HISTORY:  Past Medical History:  Diagnosis Date  . Cancer ([place])    lung cancer  . Depression   . Duodenitis   . Dyspnea   . Family history of cancer   . High cholesterol   . Personal history of colonic polyps   . Pre-diabetes   . Umbilical hernia   . UTI (urinary tract infection)     SURGICAL HISTORY: Past Surgical History:  Procedure Laterality Date  . COLONOSCOPY WITH PROPOFOL N/A 03/24/2021   Procedure: COLONOSCOPY WITH PROPOFOL;  Surgeon: Jonathon Bellows, MD;  Location: [facility] ENDOSCOPY;  Service: Gastroenterology;  Laterality: N/A;  . INTERCOSTAL NERVE BLOCK  09/19/2020   Procedure: INTERCOSTAL NERVE BLOCK;  Surgeon: Melrose Nakayama, MD;  Location: [place];  Service: Thoracic;;  . IR IMAGING GUIDED PORT INSERTION  09/23/2021  . LUNG LOBECTOMY Right   . LUNG REMOVAL, PARTIAL Right 09/19/2020  . NODE DISSECTION Right 09/19/2020   Procedure: NODE DISSECTION;  Surgeon: Melrose Nakayama, MD;  Location: [place];  Service: Thoracic;  Laterality: Right;  Marland Kitchen VIDEO BRONCHOSCOPY N/A 07/08/2021   Procedure: VIDEO BRONCHOSCOPY;  Surgeon: Melrose Nakayama, MD;  Location[facility] OR;  Service: Thoracic;  Laterality: N/A;    SOCIAL HISTORY: Social History   Socioeconomic History  . Marital status: Single    Spouse name: Not on file  . Number of children: Not on file  . Years of education: Not on file  . Highest education level: Not on file  Occupational History  . Not on file  Tobacco Use  . Smoking status: Some Days    Packs/day: 0.25     Years: 43.00    Total pack years: 10.75    Types: Cigarettes  . Smokeless tobacco: Never  . Tobacco comments:    states she is slowing, trying to quit  Vaping Use  . Vaping Use: Never used  Substance and Sexual Activity  . Alcohol use: Yes    Alcohol/week: 2.0 standard drinks of alcohol    Types: 2 Cans of beer per week    Comment: occasional  . Drug use: No  . Sexual activity: Not on file  Other Topics Concern  . Not on file  Social History Narrative   Lives in [place]; smokes; now and then beer; with boy friend. 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    Social Determinants of Health   Financial Resource Strain: High Risk (04/22/2022)   Overall Financial Resource Strain (CARDIA)   . Difficulty of Paying Living Expenses: Hard  Food Insecurity: Food Insecurity Present (07/09/2021)   Hunger Vital Sign   . Worried About Charity fundraiser in the Last Year: Sometimes true   . Ran Out of Food in the Last Year: Sometimes true  Transportation Needs:  No Transportation Needs (07/09/2021)   PRAPARE - Transportation   . Lack of Transportation (Medical): No   . Lack of Transportation (Non-Medical): No  Physical Activity: Insufficiently Active (04/16/2021)   Exercise Vital Sign   . Days of Exercise per Week: 7 days   . Minutes of Exercise per Session: 20 min  Stress: Stress Concern Present (04/22/2022)   [place]   . Feeling of Stress : Very much  Social Connections: Socially Isolated (04/22/2022)   Social Connection and Isolation Panel [NHANES]   . Frequency of Communication with Friends and Family: Once a week   . Frequency of Social Gatherings with Friends and Family: Once a week   . Attends Religious Services: Never   . Active Member of Clubs or Organizations: No   . Attends Archivist Meetings: Never   . Marital Status: Living with partner  Intimate Partner Violence: At Risk (04/22/2022)    Humiliation, Afraid, Rape, and Kick questionnaire   . Fear of Current or Ex-Partner: No   . Emotionally Abused: Yes   . Physically Abused: No   . Sexually Abused: No    FAMILY HISTORY: Family History  Problem Relation Age of Onset  . Diabetes Mother   . Cancer Mother   . Diabetes Father   . Stroke Father   . Heart attack Father        41s  . Healthy Sister   . Cancer Brother   . Hepatitis Brother   . Diabetes Brother   . Hypertension Brother   . Heart disease Brother     ALLERGIES:  has No Known Allergies.  MEDICATIONS:  Current Outpatient Medications  Medication Sig Dispense Refill  . albuterol (PROVENTIL HFA) 108 (90 Base) MCG/ACT inhaler Inhale 2 puffs into the lungs once every 6 (six) hours as needed for wheezing or shortness of breath. 6.7 g 2  . buPROPion (WELLBUTRIN SR) 150 MG 12 hr tablet Take 1 tablet (150 mg total) by mouth 2 (two) times daily. (Patient not taking: Reported on 06/18/2022) 60 tablet 0  . fentaNYL (DURAGESIC) 75 MCG/HR Place 1 patch onto the skin every 3 (three) days. 10 patch 0  . lidocaine (LIDODERM) 5 % Place 1 patch onto the skin daily. Remove & Discard patch within 12 hours or as directed by MD (Patient not taking: Reported on 06/29/2022) 30 patch 2  . lidocaine-prilocaine (EMLA) cream Apply one application topically the the affected area(s) daily as needed. 30 g 3  . loratadine (CLARITIN) 10 MG tablet Take 1 tablet (10 mg total) by mouth daily. (Patient not taking: Reported on 06/18/2022) 30 tablet 2  . meloxicam (MOBIC) 7.5 MG tablet Take 1 tablet (7.5 mg total) by mouth 2 (two) times daily with food. (Patient not taking: Reported on 06/29/2022) 30 tablet 0  . metFORMIN (GLUCOPHAGE) 500 MG tablet TAKE ONE TABLET BY MOUTH EVERY MORNING WITH BREAKFAST (Patient not taking: Reported on 06/18/2022) 90 tablet 3  . nitrofurantoin, macrocrystal-monohydrate, (MACROBID) 100 MG capsule Take 1 capsule (100 mg total) by mouth 2 (two) times daily. (Patient not  taking: Reported on 06/18/2022) 14 capsule 0  . ondansetron (ZOFRAN) 4 MG tablet TAKE 2 TABLETS (8MG) BY MOUTH ONCE EVERY 8 HOURS AS NEEDED FOR NAUSEA OR VOMITING. (Patient not taking: Reported on 06/29/2022) 80 tablet 1  . oxyCODONE-acetaminophen (PERCOCET) 10-325 MG tablet Take 1 tablet by mouth every 8 (eight) hours as needed for pain. 60 tablet 0  . prochlorperazine (  COMPAZINE) 10 MG tablet Take 1 tablet (10 mg total) by mouth once every 6 (six) hours as needed for nausea or vomiting. 40 tablet 1  . rosuvastatin (CRESTOR) 10 MG tablet TAKE ONE TABLET BY MOUTH EVERY DAY (Patient not taking: Reported on 06/18/2022) 30 tablet 3   No current facility-administered medications for this visit.   Facility-Administered Medications Ordered in Other Visits  Medication Dose Route Frequency Provider Last Rate Last Admin  . heparin lock flush 100 unit/mL  500 Units Intravenous Once Charlaine Dalton R, MD      . morphine (PF) 2 MG/ML injection           . sodium chloride flush (NS) 0.9 % injection 10 mL  10 mL Intravenous PRN Cammie Sickle, MD   10 mL at 03/01/22 0857      .  PHYSICAL EXAMINATION: ECOG PERFORMANCE STATUS: 0 - Asymptomatic  There were no vitals filed for this visit.        There were no vitals filed for this visit.         Physical Exam HENT:     Head: Normocephalic and atraumatic.     Mouth/Throat:     Pharynx: No oropharyngeal exudate.  Eyes:     Pupils: Pupils are equal, round, and reactive to light.  Cardiovascular:     Rate and Rhythm: Normal rate and regular rhythm.  Pulmonary:     Effort: Pulmonary effort is normal. No respiratory distress.     Breath sounds: Normal breath sounds. No wheezing.  Abdominal:     General: Bowel sounds are normal. There is no distension.     Palpations: Abdomen is soft. There is no mass.     Tenderness: There is no abdominal tenderness. There is no guarding or rebound.  Musculoskeletal:        General: No  tenderness. Normal range of motion.     Cervical back: Normal range of motion and neck supple.  Skin:    General: Skin is warm.  Neurological:     Mental Status: She is alert and oriented to person, place, and time.  Psychiatric:        Mood and Affect: Affect normal.     LABORATORY DATA:  I have reviewed the data as listed Lab Results  Component Value Date   WBC 6.1 06/29/2022   HGB 15.6 (H) 06/29/2022   HCT 47.3 (H) 06/29/2022   MCV 88.2 06/29/2022   PLT 325 06/29/2022   Recent Labs    05/21/22 0841 06/18/22 0905 06/29/22 1048  NA 136 137 136  K 3.3* 3.7 3.8  CL 103 106 107  CO2 25 27 24   GLUCOSE 130* 86 106*  BUN 9 16 16   CREATININE 0.52 0.51 0.58  CALCIUM 8.6* 8.8* 9.0  GFRNONAA >60 >60 >60  PROT 7.1 7.0 7.8  ALBUMIN 3.7 3.7 4.1  AST 14* 15 17  ALT 8 7 9   ALKPHOS 62 74 85  BILITOT 0.3 0.2* 0.4     RADIOGRAPHIC STUDIES: I have personally reviewed the radiological images as listed and agreed with the findings in the report. US BIOPSY (LIVER)  Result Date: 06/22/2022 INDICATION: 61 year old woman with history of metastatic lung cancer presents to IR for biopsy of left liver mass. EXAM: Ultrasound-guided biopsy of left liver mass MEDICATIONS: None. ANESTHESIA/SEDATION: Moderate (conscious) sedation was employed during this procedure. A total of Versed 2 mg and Fentanyl 100 mcg was administered intravenously by the radiology nurse. Total intra-service moderate Sedation  Time: 16 minutes. The patient's level of consciousness and vital signs were monitored continuously by radiology nursing throughout the procedure under my direct supervision. COMPLICATIONS: None immediate. PROCEDURE: Informed written consent was obtained from the patient after a thorough discussion of the procedural risks, benefits and alternatives. All questions were addressed. Maximal Sterile Barrier Technique was utilized including caps, mask, sterile gowns, sterile gloves, sterile drape, hand hygiene  and skin antiseptic. A timeout was performed prior to the initiation of the procedure. Patient position supine on the ultrasound table. Right upper quadrant skin prepped and draped in usual sterile fashion. Following local lidocaine administration, 17 gauge introducer needle was advanced into the right hepatic lobe, and 6- 18 gauge cores were obtained utilizing continuous ultrasound guidance. Gelfoam slurry was administered through the introducer needle at the biopsy site. Samples were sent to pathology in formalin. Needle removed and hemostasis achieved with 5 minutes of manual compression. Post procedure ultrasound images showed no evidence of significant hemorrhage. IMPRESSION: Ultrasound-guided biopsy of left liver mass seal Ding six 33 mm 18 gauge cores. Electronically Signed   By: Miachel Roux M.D.   On: 06/22/2022 12:00   NM PET Image Restage (PS) Skull Base to Thigh (F-18 FDG)  Result Date: 06/16/2022 CLINICAL DATA:  Subsequent treatment strategy for tumor type. EXAM: NUCLEAR MEDICINE PET SKULL BASE TO THIGH TECHNIQUE: 8.3 mCi F-18 FDG was injected intravenously. Full-ring PET imaging was performed from the skull base to thigh after the radiotracer. CT data was obtained and used for attenuation correction and anatomic localization. Fasting blood glucose: 100 mg/dl COMPARISON:  CT 03/16/2022 FINDINGS: Mediastinal blood pool activity: SUV max 1.6 Liver activity: SUV max NA NECK: No hypermetabolic lymph nodes in the neck. Incidental CT findings: None. CHEST: No hypermetabolic mediastinal or hilar nodes. No suspicious pulmonary nodules on the CT scan. Band of benign atelectasis in the RIGHT middle lobe. Incidental CT findings: Port in the anterior chest wall with tip in distal SVC. ABDOMEN/PELVIS: Intense metabolic activity associated with a 4.3 x 3.3 cm lesion in the anterior LEFT hepatic lobe. SUV max equal 13.4 on image 131. This lesion os increased in size from 3.1 x 2.2 cm on comparison CT. A second  lesion in the posterolateral LEFT hepatic lobe adjacent to the IVC measuring 18 mm (image 117) with SUV max equal 6.3. This lesion appears similar in size to comparison CT. Incidental CT findings: Adrenal glands normal. No metastatic adenopathy. However uterus noted. SKELETON: Intense metabolic activity associated with lytic lesion within the body of the RIGHT scapula. Activity is intense with SUV max equal 9.3. There is extension into the adjacent musculature posteriorly (image 69/series 2) Incidental CT findings: None. IMPRESSION: 1. Two hypermetabolic LEFT hepatic lobe metastasis. Anterior lesion is increased in size from comparison CT. 2. Intense hypermetabolic RIGHT scapular metastasis with soft tissue extension. 3. No new areas of metastatic disease. Electronically Signed   By: Suzy Bouchard M.D.   On: 06/16/2022 16:05     ASSESSMENT & PLAN:   No problem-specific Assessment & Plan notes found for this encounter.    All questions were answered. The patient knows to call the clinic with any problems, questions or concerns.    Cammie Sickle, MD 07/05/2022 9:11 AM

## 2022-07-05 NOTE — Assessment & Plan Note (Deleted)
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 SEP 6th 2023-PET scan- Two hypermetabolic LEFT hepatic lobe metastasis. Anterior lesion is increased in size from comparison CT;  Intense hypermetabolic RIGHT scapular metastasis with soft tissue extension;  No new areas of metastatic disease.  Currently on Tecentriq maintenance.   #  Proceed cycle #12 today. Tecentriq. Labs today reviewed;  acceptable for treatment today.  I reviewed the findings of the PET scan shows progressive disease.  Recommend liver biopsy.  Discussed with IR.  #However given progression of disease I would recommend switching to chemotherapy; will plan discontinue Tecentriq after this cycle.  Await biopsy-NGS testing when available.  # mets to bone/ Pain right shoulder- currently s/p RT [ 09/16/2021]; WORSENING-CONTINUE MS Contin to 30 mg 3 times daily and also continue percocet 10 mg every 8 hours also.  Discussed with Dr.Chrystal; plan reirradiation of the right scapular metastasis.  Also add lidoderm patches.   # MAY 2023- Right swollen eye/orbital inflammation/uveitis s/p Zometa status post steroids improved.-reviewed literature case reports noted; less concerning for tumor involvement.  DISCONTINUE ZOMETA. Consider X-geva-HOLD for now.   # Hypokalemia: K- 3.2; discussed re: dietary supp-STABLE.  # constipation:  Miralax BID; fluids; Dulcolax- STABLE.  # DM:  On Metformin. STABLE.  # COPD: refilled albuterol;STABLE.  #IV access: port functional-  functional  # DISPOSITION: # treatment today # cancel ALL appt in 3 weeks- # Liver Biopsy ASAP # follow up 10-14 days- MD: labs- cbc/cmp; NO chemo- Dr.B  # I reviewed the blood work- with the patient in detail; also reviewed the imaging independently [as summarized above]; and with the patient in detail.   # 40 minutes face-to-face with the patient discussing the above plan  of care; more than 50% of time spent on prognosis/ natural history; counseling and coordination.

## 2022-07-06 ENCOUNTER — Encounter: Payer: Self-pay | Admitting: Internal Medicine

## 2022-07-06 DIAGNOSIS — Z510 Encounter for antineoplastic radiation therapy: Secondary | ICD-10-CM | POA: Diagnosis not present

## 2022-07-07 ENCOUNTER — Other Ambulatory Visit: Payer: Self-pay

## 2022-07-09 ENCOUNTER — Other Ambulatory Visit: Payer: Self-pay

## 2022-07-09 ENCOUNTER — Ambulatory Visit: Payer: Medicaid Other

## 2022-07-09 ENCOUNTER — Other Ambulatory Visit: Payer: Medicaid Other

## 2022-07-09 ENCOUNTER — Ambulatory Visit: Payer: Medicaid Other | Admitting: Internal Medicine

## 2022-07-09 ENCOUNTER — Inpatient Hospital Stay (HOSPITAL_BASED_OUTPATIENT_CLINIC_OR_DEPARTMENT_OTHER): Payer: Medicaid Other | Admitting: Internal Medicine

## 2022-07-09 ENCOUNTER — Encounter: Payer: Self-pay | Admitting: Internal Medicine

## 2022-07-09 DIAGNOSIS — Z510 Encounter for antineoplastic radiation therapy: Secondary | ICD-10-CM | POA: Diagnosis not present

## 2022-07-09 DIAGNOSIS — C3411 Malignant neoplasm of upper lobe, right bronchus or lung: Secondary | ICD-10-CM

## 2022-07-09 MED ORDER — PREDNISONE 20 MG PO TABS
20.0000 mg | ORAL_TABLET | Freq: Every day | ORAL | 0 refills | Status: DC
  Filled 2022-07-09: qty 30, 30d supply, fill #0

## 2022-07-09 MED ORDER — FENTANYL 50 MCG/HR TD PT72
1.0000 | MEDICATED_PATCH | TRANSDERMAL | 0 refills | Status: DC
  Filled 2022-07-09: qty 10, 30d supply, fill #0

## 2022-07-09 MED ORDER — FENTANYL 75 MCG/HR TD PT72
1.0000 | MEDICATED_PATCH | TRANSDERMAL | 0 refills | Status: DC
  Filled 2022-07-09: qty 10, 30d supply, fill #0

## 2022-07-09 NOTE — Assessment & Plan Note (Addendum)
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 SEP 6th 2023-PET scan- Two hypermetabolic LEFT hepatic lobe metastasis. Anterior lesion is increased in size from comparison CT;  Intense hypermetabolic RIGHT scapular metastasis with soft tissue extension;  No new areas of metastatic disease. SEP 2023-liver biopsy shows neuroendocrine carcinoma/large cell; no adenocarcinoma noted.  Discontinue Tecentriq any progression of disease.  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    #Given the progression of disease-discussed that patient will need alternative systemic therapy.  The options include IPI+NIVO+chemo [STK-11/KEAPSAKE] Vs. standard chemotherapy for neuroendocrine second-line- Lubrinectidin/Topotecan.  However hold systemic therapy while patient getting radiation.  # mets to bone/ Pain right shoulder- currently s/p RT [ 09/16/2021]; WORSENING- continue percocet 10 mg every 6 hours also. iIncrease   fenatnyl 125 Discussed with Dr.Chrystal; plan reirradiation of the right scapular metastasis.  Plan to START RT on Oct 2nd, 2023-.  10 fractions.  Add additional prednisone 20 mg today.  # MAY 2023- Right swollen eye/orbital inflammation/uveitis s/p Zometa status post steroids improved.-reviewed literature case reports noted; less concerning for tumor involvement.  DISCONTINUE ZOMETA. Consider X-geva-HOLD for now.   # Hypokalemia: K- 3.2; discussed re: dietary supp-STABLE.  # constipation:  Miralax BID; fluids; Dulcolax- STABLE.  # DM:  On Metformin.  Monitor closely on steroids.  STABLE.  # COPD: refilled albuterol;STABLE.  #IV access: port functional-  functional  # DISPOSITION:  # follow up 10-14 days- MD: labs- cbc/cmp; NO chemo- Dr.B  .

## 2022-07-09 NOTE — Progress Notes (Signed)
Fentanyl patch and Oxycodone is not helping with right arm pain, 5/10 pain scale today.  Still has Morphine 30 mg at home and is taking 1 tab a day.  Taking stool softener twice a day for constipation with minimal improvement.   Starts radiation on Monday 07/12/22

## 2022-07-09 NOTE — Progress Notes (Signed)
[place] CONSULT NOTE  Patient Care Team: Cammie Sickle, MD as PCP - General (Internal Medicine) Telford Nab, RN as Oncology Nurse Navigator  CHIEF COMPLAINTS/PURPOSE OF CONSULTATION: lung cancer  #  Oncology History Overview Note  # NOV -[id number] [facility] CANCER SCREENING PROGRAM]-18 mm right upper lobe lung nodule; DEC 2021- s/p right upper lobectomy [Dr. Roxan Hockey; [place]]; STAGE: I [pT-18 mm; LN-12=0]; predominant large cell neuroendocrine; minor adenocarcinoma.  Declines adjuvant chemotherapy.  #Recurrent/stage IV cancer NOV 2022- PET scan-scapular lesion liver lesion gastrohepatic lymphadenopathy.  11/21- start RT to right scapular lesion  # DEC 3rd, 2022- CARBO=ETOP+TECEN; udenyca #1; SEP 2023- STOP Tencetriq- progression  SEP 2023-liver biopsy shows neuroendocrine carcinoma/large cell; no adenocarcinoma noted.  Discontinue Tecentriq any progression of disease.  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   # OCTOBER, 2023-  # # MAY 2023- Right swollen eye/orbital inflammation/uveitis s/p Zometa status post steroids improved.-reviewed literature case reports noted; less concerning for tumor involvement.  DISCONTINUE ZOMETA.  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  Visceral Pleura Invasion: Not identified  Direct Invasion of Adjacent Structures: No adjacent structures present  Lymphovascular Invasion: Not identified  Margins: All margins negative for invasive carcinoma       Closest Margin(s) to Invasive Carcinoma: Bronchovascular margin  Treatment Effect: No known presurgical therapy   Regional Lymph Nodes:       Number of Lymph Nodes Involved: 0                            Nodal Sites with Tumor: Not applicable       Number of Lymph Nodes Examined: 12      Primary cancer of right upper lobe of lung ([place])  11/03/2020 Initial Diagnosis   Primary cancer of right upper lobe of lung ([place])   11/03/2020 Cancer Staging   Staging form: Lung, AJCC 8th Edition - Pathologic: Stage IA3 (pT1c, pN0, cM0) - Signed by Cammie Sickle, MD on 11/04/2020   09/14/2021 - 04/30/2022 Chemotherapy   Patient is on Treatment Plan : LUNG SCLC Carboplatin + Etoposide + Atezolizumab Induction q21d / Atezolizumab Maintenance q21d     09/15/2021 -  Chemotherapy   Patient is on Treatment Plan : LUNG SCLC Carboplatin + Etoposide + Atezolizumab Induction q21d x 4 cycles / Atezolizumab Maintenance q21d     11/09/2021 Cancer Staging   Staging form: Lung, AJCC 8th Edition - Pathologic: Stage IVB (pTX, pNX, cM1c) - Signed by Cammie Sickle, MD on 11/09/2021   06/22/2022 Pathology Results   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  Alone.   Stacie Kennedy 61 y.o.  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  Fentanyl patch and Oxycodone is not helping with right arm pain, 5/10 pain scale today.  Patient is on fentanyl patch 75 mcg without any significant improvement of the pain.  She is still taking oxycodone 10 mg  every 6 hours as needed.  Still has Morphine Contin 30 mg at home and is taking 1 tab a day.  Taking stool softener twice a day for constipation with minimal improvement.    Starts radiation on Monday 07/12/22.  Patient continues to complains of restricted movement of the right shoulder.  No nausea no vomiting.  No headaches. Chronic shortness  of breath-not any worse.   Review of Systems  Constitutional:  Negative for chills, diaphoresis, fever, malaise/fatigue and weight loss.  HENT:  Negative for nosebleeds and sore throat.   Eyes:  Negative for double vision.  Respiratory:  Negative for cough, hemoptysis, sputum production, shortness of breath and wheezing.   Cardiovascular:  Negative for chest pain, palpitations, orthopnea and leg swelling.  Gastrointestinal:  Negative for abdominal pain, blood in stool, constipation, diarrhea, heartburn, melena, nausea and vomiting.  Genitourinary:  Negative for dysuria, frequency and urgency.  Musculoskeletal:  Positive for back pain, joint pain and myalgias.  Skin: Negative.  Negative for itching and rash.  Neurological:  Negative for dizziness, tingling, focal weakness, weakness and headaches.  Endo/Heme/Allergies:  Does not bruise/bleed easily.  Psychiatric/Behavioral:  Negative for depression. The patient is not nervous/anxious and does not have insomnia.      MEDICAL HISTORY:  Past Medical History:  Diagnosis Date   Cancer [facility])    lung cancer   Depression    Duodenitis    Dyspnea    Family history of cancer    High cholesterol    Personal history of colonic polyps    Pre-diabetes    Umbilical hernia    UTI (urinary tract infection)     SURGICAL HISTORY: Past Surgical History:  Procedure Laterality Date   COLONOSCOPY WITH PROPOFOL N/A 03/24/2021   Procedure: COLONOSCOPY WITH PROPOFOL;  Surgeon: Jonathon Bellows, MD;  Location: [facility] ENDOSCOPY;  Service: Gastroenterology;  Laterality: N/A;   INTERCOSTAL NERVE BLOCK  09/19/2020   Procedure: INTERCOSTAL NERVE BLOCK;  Surgeon: Melrose Nakayama, MD;  Location: [place];  Service: Thoracic;;   IR IMAGING GUIDED PORT INSERTION  09/23/2021   LUNG LOBECTOMY Right    LUNG REMOVAL, PARTIAL Right 09/19/2020   NODE DISSECTION Right 09/19/2020   Procedure: NODE DISSECTION;  Surgeon: Melrose Nakayama, MD;  Location: [place];  Service:  Thoracic;  Laterality: Right;   VIDEO BRONCHOSCOPY N/A 07/08/2021   Procedure: VIDEO BRONCHOSCOPY;  Surgeon: Melrose Nakayama, MD;  Location: [facility] OR;  Service: Thoracic;  Laterality: N/A;    SOCIAL HISTORY: Social History   Socioeconomic History   Marital status: Single    Spouse name: Not on file   Number of children: Not on file   Years of education: Not on file   Highest education level: Not on file  Occupational History   Not on file  Tobacco Use   Smoking status: Some Days    Packs/day: 0.25    Years: 43.00    Total pack years: 10.75    Types: Cigarettes   Smokeless tobacco: Never   Tobacco comments:    states she is slowing, trying to quit  Vaping Use   Vaping Use: Never used  Substance and Sexual Activity   Alcohol use: Yes    Alcohol/week: 2.0 standard drinks of alcohol    Types: 2 Cans of beer per week    Comment: occasional   Drug use: No   Sexual activity: Not on file  Other Topics Concern   Not on file  Social History Narrative  Lives in [place]; smokes; now and then beer; with boy friend. 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    Social Determinants of Health   Financial Resource Strain: High Risk (04/22/2022)   Overall Financial Resource Strain (CARDIA)    Difficulty of Paying Living Expenses: Hard  Food Insecurity: Food Insecurity Present (07/09/2021)   Hunger Vital Sign    Worried About Running Out of Food in the Last Year: Sometimes true    Ran Out of Food in the Last Year: Sometimes true  Transportation Needs: No Transportation Needs (07/09/2021)   PRAPARE - Hydrologist (Medical): No    Lack of Transportation (Non-Medical): No  Physical Activity: Insufficiently Active (04/16/2021)   Exercise Vital Sign    Days of Exercise per Week: 7 days    Minutes of Exercise per Session: 20 min  Stress: Stress Concern Present (04/22/2022)   [place]    Feeling of Stress : Very much  Social Connections: Socially Isolated (04/22/2022)   Social Connection and Isolation Panel [NHANES]    Frequency of Communication with Friends and Family: Once a week    Frequency of Social Gatherings with Friends and Family: Once a week    Attends Religious Services: Never    Marine scientist or Organizations: No    Attends Archivist Meetings: Never    Marital Status: Living with partner  Intimate Partner Violence: At Risk (04/22/2022)   Humiliation, Afraid, Rape, and Kick questionnaire    Fear of Current or Ex-Partner: No    Emotionally Abused: Yes    Physically Abused: No    Sexually Abused: No    FAMILY HISTORY: Family History  Problem Relation Age of Onset   Diabetes Mother    Cancer Mother    Diabetes Father    Stroke Father    Heart attack Father        94s   Healthy Sister    Cancer Brother    Hepatitis Brother    Diabetes Brother    Hypertension Brother    Heart disease Brother     ALLERGIES:  has No Known Allergies.  MEDICATIONS:  Current Outpatient Medications  Medication Sig Dispense Refill   albuterol (PROVENTIL HFA) 108 (90 Base) MCG/ACT inhaler Inhale 2 puffs into the lungs once every 6 (six) hours as needed for wheezing or shortness of breath. 6.7 g 2   fentaNYL (DURAGESIC) 50 MCG/HR Place 1 patch onto the skin every 3 (three) days. Use 50 mcg patch with 75 mcg patch-[total 125 mcg] every 3 days. 10 patch 0   lidocaine-prilocaine (EMLA) cream Apply one application topically the the affected area(s) daily as needed. 30 g 3   oxyCODONE-acetaminophen (PERCOCET) 10-325 MG tablet Take 1 tablet by mouth every 8 (eight) hours as needed for pain. 60 tablet 0   predniSONE (DELTASONE) 20 MG tablet Take 1 tablet (20 mg total) by mouth daily with breakfast. Once a day with food. 30 tablet 0   buPROPion (WELLBUTRIN SR) 150 MG 12 hr tablet Take 1 tablet (150 mg total) by mouth 2 (two) times daily. (Patient  not taking: Reported on 06/18/2022) 60 tablet 0   fentaNYL (DURAGESIC) 75 MCG/HR Place 1 patch onto the skin every 3 (three) days. Use 75 mcg patch with 50 mcg patch-[total 125 mcg] every 3 days. 10 patch 0   lidocaine (LIDODERM) 5 % Place 1 patch onto the skin daily. Remove & Discard  patch within 12 hours or as directed by MD (Patient not taking: Reported on 06/29/2022) 30 patch 2   loratadine (CLARITIN) 10 MG tablet Take 1 tablet (10 mg total) by mouth daily. (Patient not taking: Reported on 06/18/2022) 30 tablet 2   meloxicam (MOBIC) 7.5 MG tablet Take 1 tablet (7.5 mg total) by mouth 2 (two) times daily with food. (Patient not taking: Reported on 06/29/2022) 30 tablet 0   metFORMIN (GLUCOPHAGE) 500 MG tablet TAKE ONE TABLET BY MOUTH EVERY MORNING WITH BREAKFAST (Patient not taking: Reported on 06/18/2022) 90 tablet 3   nitrofurantoin, macrocrystal-monohydrate, (MACROBID) 100 MG capsule Take 1 capsule (100 mg total) by mouth 2 (two) times daily. (Patient not taking: Reported on 06/18/2022) 14 capsule 0   ondansetron (ZOFRAN) 4 MG tablet TAKE 2 TABLETS (8MG) BY MOUTH ONCE EVERY 8 HOURS AS NEEDED FOR NAUSEA OR VOMITING. (Patient not taking: Reported on 06/29/2022) 80 tablet 1   prochlorperazine (COMPAZINE) 10 MG tablet Take 1 tablet (10 mg total) by mouth once every 6 (six) hours as needed for nausea or vomiting. (Patient not taking: Reported on 07/09/2022) 40 tablet 1   rosuvastatin (CRESTOR) 10 MG tablet TAKE ONE TABLET BY MOUTH EVERY DAY (Patient not taking: Reported on 06/18/2022) 30 tablet 3   No current facility-administered medications for this visit.   Facility-Administered Medications Ordered in Other Visits  Medication Dose Route Frequency Provider Last Rate Last Admin   heparin lock flush 100 unit/mL  500 Units Intravenous Once Charlaine Dalton R, MD       morphine (PF) 2 MG/ML injection            sodium chloride flush (NS) 0.9 % injection 10 mL  10 mL Intravenous PRN Cammie Sickle, MD    10 mL at 03/01/22 0857      .  PHYSICAL EXAMINATION: ECOG PERFORMANCE STATUS: 0 - Asymptomatic  Vitals:   07/09/22 1000  BP: 120/75  Pulse: 63  Resp: 16  Temp: 98.1 F (36.7 C)          Filed Weights   07/09/22 1000  Weight: 148 lb 12.8 oz (67.5 kg)           Physical Exam HENT:     Head: Normocephalic and atraumatic.     Mouth/Throat:     Pharynx: No oropharyngeal exudate.  Eyes:     Pupils: Pupils are equal, round, and reactive to light.  Cardiovascular:     Rate and Rhythm: Normal rate and regular rhythm.  Pulmonary:     Effort: Pulmonary effort is normal. No respiratory distress.     Breath sounds: Normal breath sounds. No wheezing.  Abdominal:     General: Bowel sounds are normal. There is no distension.     Palpations: Abdomen is soft. There is no mass.     Tenderness: There is no abdominal tenderness. There is no guarding or rebound.  Musculoskeletal:        General: No tenderness. Normal range of motion.     Cervical back: Normal range of motion and neck supple.  Skin:    General: Skin is warm.  Neurological:     Mental Status: She is alert and oriented to person, place, and time.  Psychiatric:        Mood and Affect: Affect normal.      LABORATORY DATA:  I have reviewed the data as listed Lab Results  Component Value Date   WBC 6.1 06/29/2022   HGB 15.6 (H) 06/29/2022  HCT 47.3 (H) 06/29/2022   MCV 88.2 06/29/2022   PLT 325 06/29/2022   Recent Labs    05/21/22 0841 06/18/22 0905 06/29/22 1048  NA 136 137 136  K 3.3* 3.7 3.8  CL 103 106 107  CO2 _0 GLUCOSE 130* 86 106*  BUN _1 CREATININE 0.52 0.51 0.58  CALCIUM 8.6* 8.8* 9.0  GFRNONAA >60 >60 >60  PROT 7.1 7.0 7.8  ALBUMIN 3.7 3.7 4.1  AST 14* 15 17  ALT _2 ALKPHOS 62 74 85  BILITOT 0.3 0.2* 0.4    RADIOGRAPHIC STUDIES: I have personally reviewed the radiological images as listed and agreed with the findings in the report. US BIOPSY  (LIVER)  Result Date: 06/22/2022 INDICATION: 61 year old woman with history of metastatic lung cancer presents to IR for biopsy of left liver mass. EXAM: Ultrasound-guided biopsy of left liver mass MEDICATIONS: None. ANESTHESIA/SEDATION: Moderate (conscious) sedation was employed during this procedure. A total of Versed 2 mg and Fentanyl 100 mcg was administered intravenously by the radiology nurse. Total intra-service moderate Sedation Time: 16 minutes. The patient's level of consciousness and vital signs were monitored continuously by radiology nursing throughout the procedure under my direct supervision. COMPLICATIONS: None immediate. PROCEDURE: Informed written consent was obtained from the patient after a thorough discussion of the procedural risks, benefits and alternatives. All questions were addressed. Maximal Sterile Barrier Technique was utilized including caps, mask, sterile gowns, sterile gloves, sterile drape, hand hygiene and skin antiseptic. A timeout was performed prior to the initiation of the procedure. Patient position supine on the ultrasound table. Right upper quadrant skin prepped and draped in usual sterile fashion. Following local lidocaine administration, 17 gauge introducer needle was advanced into the right hepatic lobe, and 6- 18 gauge cores were obtained utilizing continuous ultrasound guidance. Gelfoam slurry was administered through the introducer needle at the biopsy site. Samples were sent to pathology in formalin. Needle removed and hemostasis achieved with 5 minutes of manual compression. Post procedure ultrasound images showed no evidence of significant hemorrhage. IMPRESSION: Ultrasound-guided biopsy of left liver mass seal Ding six 33 mm 18 gauge cores. Electronically Signed   By: Miachel Roux M.D.   On: 06/22/2022 12:00   NM PET Image Restage (PS) Skull Base to Thigh (F-18 FDG)  Result Date: 06/16/2022 CLINICAL DATA:  Subsequent treatment strategy for tumor type. EXAM:  NUCLEAR MEDICINE PET SKULL BASE TO THIGH TECHNIQUE: 8.3 mCi F-18 FDG was injected intravenously. Full-ring PET imaging was performed from the skull base to thigh after the radiotracer. CT data was obtained and used for attenuation correction and anatomic localization. Fasting blood glucose: 100 mg/dl COMPARISON:  CT 03/16/2022 FINDINGS: Mediastinal blood pool activity: SUV max 1.6 Liver activity: SUV max NA NECK: No hypermetabolic lymph nodes in the neck. Incidental CT findings: None. CHEST: No hypermetabolic mediastinal or hilar nodes. No suspicious pulmonary nodules on the CT scan. Band of benign atelectasis in the RIGHT middle lobe. Incidental CT findings: Port in the anterior chest wall with tip in distal SVC. ABDOMEN/PELVIS: Intense metabolic activity associated with a 4.3 x 3.3 cm lesion in the anterior LEFT hepatic lobe. SUV max equal 13.4 on image 131. This lesion os increased in size from 3.1 x 2.2 cm on comparison CT. A second lesion in the posterolateral LEFT hepatic lobe adjacent to the IVC measuring 18 mm (image 117) with SUV max equal 6.3. This lesion appears similar in size to comparison CT. Incidental CT findings: Adrenal  glands normal. No metastatic adenopathy. However uterus noted. SKELETON: Intense metabolic activity associated with lytic lesion within the body of the RIGHT scapula. Activity is intense with SUV max equal 9.3. There is extension into the adjacent musculature posteriorly (image 69/series 2) Incidental CT findings: None. IMPRESSION: 1. Two hypermetabolic LEFT hepatic lobe metastasis. Anterior lesion is increased in size from comparison CT. 2. Intense hypermetabolic RIGHT scapular metastasis with soft tissue extension. 3. No new areas of metastatic disease. Electronically Signed   By: Suzy Bouchard M.D.   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 SEP 6th 2023-PET scan- Two hypermetabolic LEFT hepatic lobe metastasis. Anterior lesion is increased in size from comparison CT;  Intense hypermetabolic RIGHT scapular metastasis with soft tissue extension;  No new areas of metastatic disease. SEP 2023-liver biopsy shows neuroendocrine carcinoma/large cell; no adenocarcinoma noted.  Discontinue Tecentriq any progression of disease.  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    #Given the progression of disease-discussed that patient will need alternative systemic therapy.  The options include IPI+NIVO+chemo [STK-11/KEAPSAKE] Vs. standard chemotherapy for neuroendocrine second-line- Lubrinectidin/Topotecan.  However hold systemic therapy while patient getting radiation.  # mets to bone/ Pain right shoulder- currently s/p RT [ 09/16/2021]; WORSENING- continue percocet 10 mg every 6 hours also. iIncrease   fenatnyl 125 Discussed with Dr.Chrystal; plan reirradiation of the right scapular metastasis.  Plan to START RT on Oct 2nd, 2023-.  10 fractions.  Add additional prednisone 20 mg today.  # MAY 2023- Right swollen eye/orbital inflammation/uveitis s/p Zometa status post steroids improved.-reviewed literature case reports noted; less concerning for tumor involvement.  DISCONTINUE ZOMETA. Consider X-geva-HOLD for now.   # Hypokalemia: K- 3.2; discussed re: dietary supp-STABLE.  # constipation:  Miralax BID; fluids; Dulcolax- STABLE.  # DM:  On Metformin.  Monitor closely on steroids.  STABLE.  # COPD: refilled albuterol;STABLE.  #IV access: port functional-  functional  # DISPOSITION:  # follow up 10-14 days- MD: labs- cbc/cmp; NO chemo- Dr.B  .          All questions were answered. The patient knows to call the clinic with any problems, questions or concerns.    Cammie Sickle, MD 07/09/2022 1:10 PM

## 2022-07-12 ENCOUNTER — Ambulatory Visit
Admission: RE | Admit: 2022-07-12 | Discharge: 2022-07-12 | Disposition: A | Discharge status: Home / Self Care | Payer: Medicaid Other | Source: Ambulatory Visit | Attending: Radiation Oncology | Admitting: Radiation Oncology

## 2022-07-12 ENCOUNTER — Other Ambulatory Visit: Payer: Self-pay

## 2022-07-12 DIAGNOSIS — M25511 Pain in right shoulder: Secondary | ICD-10-CM | POA: Insufficient documentation

## 2022-07-12 DIAGNOSIS — Z5112 Encounter for antineoplastic immunotherapy: Secondary | ICD-10-CM | POA: Insufficient documentation

## 2022-07-12 DIAGNOSIS — F17210 Nicotine dependence, cigarettes, uncomplicated: Secondary | ICD-10-CM | POA: Diagnosis not present

## 2022-07-12 DIAGNOSIS — C7951 Secondary malignant neoplasm of bone: Secondary | ICD-10-CM | POA: Insufficient documentation

## 2022-07-12 DIAGNOSIS — E119 Type 2 diabetes mellitus without complications: Secondary | ICD-10-CM | POA: Insufficient documentation

## 2022-07-12 DIAGNOSIS — Z510 Encounter for antineoplastic radiation therapy: Secondary | ICD-10-CM | POA: Diagnosis present

## 2022-07-12 DIAGNOSIS — E7800 Pure hypercholesterolemia, unspecified: Secondary | ICD-10-CM | POA: Insufficient documentation

## 2022-07-12 DIAGNOSIS — C787 Secondary malignant neoplasm of liver and intrahepatic bile duct: Secondary | ICD-10-CM | POA: Insufficient documentation

## 2022-07-12 DIAGNOSIS — C3411 Malignant neoplasm of upper lobe, right bronchus or lung: Secondary | ICD-10-CM | POA: Insufficient documentation

## 2022-07-12 DIAGNOSIS — Z7984 Long term (current) use of oral hypoglycemic drugs: Secondary | ICD-10-CM | POA: Insufficient documentation

## 2022-07-12 DIAGNOSIS — K5900 Constipation, unspecified: Secondary | ICD-10-CM | POA: Insufficient documentation

## 2022-07-12 DIAGNOSIS — Z79899 Other long term (current) drug therapy: Secondary | ICD-10-CM | POA: Diagnosis not present

## 2022-07-12 DIAGNOSIS — Z86010 Personal history of colonic polyps: Secondary | ICD-10-CM | POA: Diagnosis not present

## 2022-07-12 DIAGNOSIS — E876 Hypokalemia: Secondary | ICD-10-CM | POA: Diagnosis not present

## 2022-07-12 LAB — RAD ONC ARIA SESSION SUMMARY
Course Elapsed Days: 0
Plan Fractions Treated to Date: 1
Plan Prescribed Dose Per Fraction: 6 Gy
Plan Total Fractions Prescribed: 5
Plan Total Prescribed Dose: 30 Gy
Reference Point Dosage Given to Date: 6 Gy
Reference Point Session Dosage Given: 6 Gy
Session Number: 1

## 2022-07-14 ENCOUNTER — Other Ambulatory Visit: Payer: Self-pay

## 2022-07-14 ENCOUNTER — Ambulatory Visit
Admission: RE | Admit: 2022-07-14 | Discharge: 2022-07-14 | Disposition: A | Discharge status: Home / Self Care | Payer: Medicaid Other | Source: Ambulatory Visit | Attending: Radiation Oncology | Admitting: Radiation Oncology

## 2022-07-14 DIAGNOSIS — Z510 Encounter for antineoplastic radiation therapy: Secondary | ICD-10-CM | POA: Diagnosis not present

## 2022-07-14 LAB — RAD ONC ARIA SESSION SUMMARY
Course Elapsed Days: 2
Plan Fractions Treated to Date: 2
Plan Prescribed Dose Per Fraction: 6 Gy
Plan Total Fractions Prescribed: 5
Plan Total Prescribed Dose: 30 Gy
Reference Point Dosage Given to Date: 12 Gy
Reference Point Session Dosage Given: 6 Gy
Session Number: 2

## 2022-07-19 ENCOUNTER — Other Ambulatory Visit: Payer: Self-pay

## 2022-07-19 ENCOUNTER — Ambulatory Visit
Admission: RE | Admit: 2022-07-19 | Discharge: 2022-07-19 | Disposition: A | Discharge status: Home / Self Care | Payer: Medicaid Other | Source: Ambulatory Visit | Attending: Radiation Oncology | Admitting: Radiation Oncology

## 2022-07-19 DIAGNOSIS — Z510 Encounter for antineoplastic radiation therapy: Secondary | ICD-10-CM | POA: Diagnosis not present

## 2022-07-19 LAB — RAD ONC ARIA SESSION SUMMARY
Course Elapsed Days: 7
Plan Fractions Treated to Date: 3
Plan Prescribed Dose Per Fraction: 6 Gy
Plan Total Fractions Prescribed: 5
Plan Total Prescribed Dose: 30 Gy
Reference Point Dosage Given to Date: 18 Gy
Reference Point Session Dosage Given: 6 Gy
Session Number: 3

## 2022-07-21 ENCOUNTER — Ambulatory Visit
Admission: RE | Admit: 2022-07-21 | Discharge: 2022-07-21 | Disposition: A | Discharge status: Home / Self Care | Payer: Medicaid Other | Source: Ambulatory Visit | Attending: Radiation Oncology | Admitting: Radiation Oncology

## 2022-07-21 ENCOUNTER — Other Ambulatory Visit: Payer: Self-pay

## 2022-07-21 DIAGNOSIS — Z510 Encounter for antineoplastic radiation therapy: Secondary | ICD-10-CM | POA: Diagnosis not present

## 2022-07-21 LAB — RAD ONC ARIA SESSION SUMMARY
Course Elapsed Days: 9
Plan Fractions Treated to Date: 4
Plan Prescribed Dose Per Fraction: 6 Gy
Plan Total Fractions Prescribed: 5
Plan Total Prescribed Dose: 30 Gy
Reference Point Dosage Given to Date: 24 Gy
Reference Point Session Dosage Given: 6 Gy
Session Number: 4

## 2022-07-22 ENCOUNTER — Other Ambulatory Visit: Payer: Self-pay | Admitting: *Deleted

## 2022-07-22 ENCOUNTER — Other Ambulatory Visit: Payer: Self-pay

## 2022-07-22 MED ORDER — OXYCODONE-ACETAMINOPHEN 10-325 MG PO TABS
1.0000 | ORAL_TABLET | Freq: Three times a day (TID) | ORAL | 0 refills | Status: DC | PRN
  Filled 2022-07-22 – 2022-07-23 (×2): qty 60, 20d supply, fill #0

## 2022-07-23 ENCOUNTER — Inpatient Hospital Stay: Payer: Medicaid Other

## 2022-07-23 ENCOUNTER — Other Ambulatory Visit: Payer: Self-pay

## 2022-07-23 ENCOUNTER — Inpatient Hospital Stay: Payer: Medicaid Other | Admitting: Internal Medicine

## 2022-07-23 ENCOUNTER — Encounter: Payer: Self-pay | Admitting: Internal Medicine

## 2022-07-23 NOTE — Assessment & Plan Note (Deleted)
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 SEP 6th 2023-PET scan- Two hypermetabolic LEFT hepatic lobe metastasis. Anterior lesion is increased in size from comparison CT;  Intense hypermetabolic RIGHT scapular metastasis with soft tissue extension;  No new areas of metastatic disease. SEP 2023-liver biopsy shows neuroendocrine carcinoma/large cell; no adenocarcinoma noted.  Discontinue Tecentriq any progression of disease.  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    #Given the progression of disease-discussed that patient will need alternative systemic therapy.  The options include IPI+NIVO+chemo [STK-11/KEAPSAKE] Vs. standard chemotherapy for neuroendocrine second-line- Lubrinectidin/Topotecan.  However hold systemic therapy while patient getting radiation.  # mets to bone/ Pain right shoulder- currently s/p RT [ 09/16/2021]; WORSENING- continue percocet 10 mg every 6 hours also. iIncrease   fenatnyl 125 Discussed with Dr.Chrystal; plan reirradiation of the right scapular metastasis.  Plan to START RT on Oct 2nd, 2023-.  10 fractions.  Add additional prednisone 20 mg today.  # MAY 2023- Right swollen eye/orbital inflammation/uveitis s/p Zometa status post steroids improved.-reviewed literature case reports noted; less concerning for tumor involvement.  DISCONTINUE ZOMETA. Consider X-geva-HOLD for now.   # Hypokalemia: K- 3.2; discussed re: dietary supp-STABLE.  # constipation:  Miralax BID; fluids; Dulcolax- STABLE.  # DM:  On Metformin.  Monitor closely on steroids.  STABLE.  # COPD: refilled albuterol;STABLE.  #IV access: port functional-  functional  # DISPOSITION:  # follow up 10-14 days- MD: labs- cbc/cmp; NO chemo- Dr.B  .

## 2022-07-26 ENCOUNTER — Encounter: Payer: Self-pay | Admitting: Internal Medicine

## 2022-07-26 ENCOUNTER — Other Ambulatory Visit: Payer: Self-pay

## 2022-07-26 ENCOUNTER — Inpatient Hospital Stay (HOSPITAL_BASED_OUTPATIENT_CLINIC_OR_DEPARTMENT_OTHER): Payer: Medicaid Other | Admitting: Internal Medicine

## 2022-07-26 ENCOUNTER — Ambulatory Visit
Admission: RE | Admit: 2022-07-26 | Discharge: 2022-07-26 | Disposition: A | Discharge status: Home / Self Care | Payer: Medicaid Other | Source: Ambulatory Visit | Attending: Radiation Oncology | Admitting: Radiation Oncology

## 2022-07-26 ENCOUNTER — Inpatient Hospital Stay: Payer: Medicaid Other | Attending: Radiation Oncology

## 2022-07-26 VITALS — BP 128/75 | HR 72 | Temp 98.4°F | Resp 16 | Wt 148.6 lb

## 2022-07-26 DIAGNOSIS — Z5111 Encounter for antineoplastic chemotherapy: Secondary | ICD-10-CM | POA: Insufficient documentation

## 2022-07-26 DIAGNOSIS — E876 Hypokalemia: Secondary | ICD-10-CM | POA: Insufficient documentation

## 2022-07-26 DIAGNOSIS — K5900 Constipation, unspecified: Secondary | ICD-10-CM | POA: Insufficient documentation

## 2022-07-26 DIAGNOSIS — Z7952 Long term (current) use of systemic steroids: Secondary | ICD-10-CM | POA: Insufficient documentation

## 2022-07-26 DIAGNOSIS — C3411 Malignant neoplasm of upper lobe, right bronchus or lung: Secondary | ICD-10-CM | POA: Diagnosis not present

## 2022-07-26 DIAGNOSIS — F17210 Nicotine dependence, cigarettes, uncomplicated: Secondary | ICD-10-CM | POA: Insufficient documentation

## 2022-07-26 DIAGNOSIS — E119 Type 2 diabetes mellitus without complications: Secondary | ICD-10-CM | POA: Insufficient documentation

## 2022-07-26 DIAGNOSIS — Z809 Family history of malignant neoplasm, unspecified: Secondary | ICD-10-CM | POA: Insufficient documentation

## 2022-07-26 DIAGNOSIS — Z510 Encounter for antineoplastic radiation therapy: Secondary | ICD-10-CM | POA: Diagnosis not present

## 2022-07-26 DIAGNOSIS — C7B8 Other secondary neuroendocrine tumors: Secondary | ICD-10-CM | POA: Insufficient documentation

## 2022-07-26 DIAGNOSIS — C7A8 Other malignant neuroendocrine tumors: Secondary | ICD-10-CM | POA: Insufficient documentation

## 2022-07-26 DIAGNOSIS — J449 Chronic obstructive pulmonary disease, unspecified: Secondary | ICD-10-CM | POA: Insufficient documentation

## 2022-07-26 DIAGNOSIS — Z902 Acquired absence of lung [part of]: Secondary | ICD-10-CM | POA: Insufficient documentation

## 2022-07-26 DIAGNOSIS — Z7984 Long term (current) use of oral hypoglycemic drugs: Secondary | ICD-10-CM | POA: Insufficient documentation

## 2022-07-26 DIAGNOSIS — Z79899 Other long term (current) drug therapy: Secondary | ICD-10-CM | POA: Insufficient documentation

## 2022-07-26 LAB — CBC WITH DIFFERENTIAL/PLATELET
Abs Immature Granulocytes: 0.03 10*3/uL (ref 0.00–0.07)
Basophils Absolute: 0 10*3/uL (ref 0.0–0.1)
Basophils Relative: 1 %
Eosinophils Absolute: 0.1 10*3/uL (ref 0.0–0.5)
Eosinophils Relative: 1 %
HCT: 46.7 % — ABNORMAL HIGH (ref 36.0–46.0)
Hemoglobin: 15.5 g/dL — ABNORMAL HIGH (ref 12.0–15.0)
Immature Granulocytes: 0 %
Lymphocytes Relative: 35 %
Lymphs Abs: 2.9 10*3/uL (ref 0.7–4.0)
MCH: 28.9 pg (ref 26.0–34.0)
MCHC: 33.2 g/dL (ref 30.0–36.0)
MCV: 87 fL (ref 80.0–100.0)
Monocytes Absolute: 0.8 10*3/uL (ref 0.1–1.0)
Monocytes Relative: 10 %
Neutro Abs: 4.5 10*3/uL (ref 1.7–7.7)
Neutrophils Relative %: 53 %
Platelets: 312 10*3/uL (ref 150–400)
RBC: 5.37 MIL/uL — ABNORMAL HIGH (ref 3.87–5.11)
RDW: 16.2 % — ABNORMAL HIGH (ref 11.5–15.5)
WBC: 8.4 10*3/uL (ref 4.0–10.5)
nRBC: 0 % (ref 0.0–0.2)

## 2022-07-26 LAB — RAD ONC ARIA SESSION SUMMARY
Course Elapsed Days: 14
Plan Fractions Treated to Date: 5
Plan Prescribed Dose Per Fraction: 6 Gy
Plan Total Fractions Prescribed: 5
Plan Total Prescribed Dose: 30 Gy
Reference Point Dosage Given to Date: 30 Gy
Reference Point Session Dosage Given: 6 Gy
Session Number: 5

## 2022-07-26 LAB — COMPREHENSIVE METABOLIC PANEL
ALT: 9 U/L (ref 0–44)
AST: 18 U/L (ref 15–41)
Albumin: 3.8 g/dL (ref 3.5–5.0)
Alkaline Phosphatase: 83 U/L (ref 38–126)
Anion gap: 7 (ref 5–15)
BUN: 11 mg/dL (ref 8–23)
CO2: 25 mmol/L (ref 22–32)
Calcium: 9.1 mg/dL (ref 8.9–10.3)
Chloride: 103 mmol/L (ref 98–111)
Creatinine, Ser: 0.42 mg/dL — ABNORMAL LOW (ref 0.44–1.00)
GFR, Estimated: >60 mL/min (ref >60)
Glucose, Bld: 94 mg/dL (ref 70–99)
Potassium: 3.8 mmol/L (ref 3.5–5.1)
Sodium: 135 mmol/L (ref 135–145)
Total Bilirubin: 0.3 mg/dL (ref 0.3–1.2)
Total Protein: 7.4 g/dL (ref 6.5–8.1)

## 2022-07-26 NOTE — Progress Notes (Signed)
DISCONTINUE OFF PATHWAY REGIMEN - Other   [id number]:Atezolizumab D1 + Carboplatin D1 + Etoposide  IV D1-3 q21 Days x 4 Cycles Followed by Huey Bienenstock D1 q21 Days:   Cycles 1 through 4, every 21 days:     Atezolizumab      Carboplatin      Etoposide    Cycles 5 and beyond, every 21 days:     Atezolizumab   **Always confirm dose/schedule in your pharmacy ordering system**  REASON: Disease Progression PRIOR TREATMENT: Atezolizumab D1 + Carboplatin D1 + Etoposide  IV D1-3 q21 Days x 4 Cycles Followed by Huey Bienenstock D1 q21 Days TREATMENT RESPONSE: Progressive Disease (PD)  START OFF PATHWAY REGIMEN - Other   [id number]:Carboplatin AUC=5 D1 + Etoposide 120 mg/m2 D1-3 q28 Days:   A cycle is every 28 days:     Carboplatin      Etoposide   **Always confirm dose/schedule in your pharmacy ordering system**  Patient Characteristics: Intent of Therapy: Non-Curative / Palliative Intent, Discussed with Patient
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
## 2022-07-26 NOTE — Progress Notes (Signed)
Still having should pain 5/10 pain scale with pain medication taken in the last 30 minutes.  Pain medication regimen consist of  Oxycodone 5/325 mg Q8H prn with Fentanyl patch 50 mcg and 75 mcg that are placed on the same day.

## 2022-07-26 NOTE — Assessment & Plan Note (Addendum)
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 SEP 6th 2023-PET scan- Two hypermetabolic LEFT hepatic lobe metastasis. Anterior lesion is increased in size from comparison CT;  Intense hypermetabolic RIGHT scapular metastasis with soft tissue extension;  No new areas of metastatic disease. SEP 2023-liver biopsy shows neuroendocrine carcinoma/large cell; no adenocarcinoma noted.  Discontinue Tecentriq any progression of disease.  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  Currently progressed on Atezolizumab.   #Given the progressive disease-I would recommend chemotherapy with carboplatin etoposide [last received in February 2023]; without immunotherapy.  Patient understands treatments are palliative not curative.  Again reviewed the potential side effects of chemotherapy including but not limited to decreased bone marrow function; risk of infection bleeding etc. D-4 Ellen Henri.  # mets to bone/ Pain right shoulder- currently s/p RT [ 09/16/2021]; WORSENING- continue percocet 10 mg every 4-6 hours also. S/p  RT on Oct 16th , 2023-. 10 fractions.  Continue fentanyl patch 150 mcg every 3 days [increased on 10/13]; if not getting any better defer to [employer] on 10/19.   # Hypokalemia: K- 3.2; discussed re: dietary supp-STABLE.  # constipation:  Miralax BID; fluids; Dulcolax- STABLE.  # DM:  On Metformin.  Monitor closely on steroids.  STABLE.  # COPD: refilled albuterol;STABLE.  #IV access: port functional-  functional  # DISPOSITION:  # follow up in 2 weeks-  MD: labs- cbc/cmp; Carbo- Etop; days 2 & 3 Eetop; D-4 - Dr.B  # 40 minutes face-to-face with the patient discussing the above plan of care; more than 50% of time spent on prognosis/ natural history; counseling and coordination.  Marland Kitchen

## 2022-07-29 ENCOUNTER — Other Ambulatory Visit: Payer: Self-pay

## 2022-07-29 ENCOUNTER — Inpatient Hospital Stay (HOSPITAL_BASED_OUTPATIENT_CLINIC_OR_DEPARTMENT_OTHER): Payer: Medicaid Other | Admitting: Hospice and Palliative Medicine

## 2022-07-29 ENCOUNTER — Telehealth: Payer: Self-pay | Admitting: *Deleted

## 2022-07-29 ENCOUNTER — Encounter: Payer: Self-pay | Admitting: Hospice and Palliative Medicine

## 2022-07-29 VITALS — BP 110/76 | HR 70 | Temp 96.3°F | Resp 18 | Ht 67.0 in | Wt 146.6 lb

## 2022-07-29 DIAGNOSIS — Z809 Family history of malignant neoplasm, unspecified: Secondary | ICD-10-CM | POA: Insufficient documentation

## 2022-07-29 DIAGNOSIS — C7A8 Other malignant neuroendocrine tumors: Secondary | ICD-10-CM | POA: Diagnosis not present

## 2022-07-29 DIAGNOSIS — E119 Type 2 diabetes mellitus without complications: Secondary | ICD-10-CM | POA: Diagnosis not present

## 2022-07-29 DIAGNOSIS — Z7952 Long term (current) use of systemic steroids: Secondary | ICD-10-CM | POA: Diagnosis not present

## 2022-07-29 DIAGNOSIS — E876 Hypokalemia: Secondary | ICD-10-CM | POA: Insufficient documentation

## 2022-07-29 DIAGNOSIS — C7B8 Other secondary neuroendocrine tumors: Secondary | ICD-10-CM | POA: Insufficient documentation

## 2022-07-29 DIAGNOSIS — K5900 Constipation, unspecified: Secondary | ICD-10-CM | POA: Insufficient documentation

## 2022-07-29 DIAGNOSIS — Z5111 Encounter for antineoplastic chemotherapy: Secondary | ICD-10-CM | POA: Diagnosis present

## 2022-07-29 DIAGNOSIS — J449 Chronic obstructive pulmonary disease, unspecified: Secondary | ICD-10-CM | POA: Insufficient documentation

## 2022-07-29 DIAGNOSIS — Z79899 Other long term (current) drug therapy: Secondary | ICD-10-CM | POA: Insufficient documentation

## 2022-07-29 DIAGNOSIS — F17210 Nicotine dependence, cigarettes, uncomplicated: Secondary | ICD-10-CM | POA: Insufficient documentation

## 2022-07-29 DIAGNOSIS — Z902 Acquired absence of lung [part of]: Secondary | ICD-10-CM | POA: Diagnosis not present

## 2022-07-29 DIAGNOSIS — Z7984 Long term (current) use of oral hypoglycemic drugs: Secondary | ICD-10-CM | POA: Insufficient documentation

## 2022-07-29 DIAGNOSIS — C3411 Malignant neoplasm of upper lobe, right bronchus or lung: Secondary | ICD-10-CM | POA: Diagnosis not present

## 2022-07-29 MED ORDER — OXYCODONE HCL ER 10 MG PO T12A
20.0000 mg | EXTENDED_RELEASE_TABLET | Freq: Two times a day (BID) | ORAL | 0 refills | Status: DC
  Filled 2022-07-29: qty 30, 15d supply, fill #0
  Filled 2022-07-30: qty 12, 3d supply, fill #0
  Filled 2022-07-30: qty 20, 5d supply, fill #0

## 2022-07-29 MED ORDER — OXYCODONE-ACETAMINOPHEN 10-325 MG PO TABS
1.0000 | ORAL_TABLET | ORAL | 0 refills | Status: DC | PRN
  Filled 2022-07-29: qty 60, 5d supply, fill #0
  Filled 2022-07-30: qty 60, 10d supply, fill #0

## 2022-07-29 MED ORDER — DULOXETINE HCL 30 MG PO CPEP
30.0000 mg | ORAL_CAPSULE | Freq: Every day | ORAL | 3 refills | Status: DC
  Filled 2022-07-29: qty 30, 30d supply, fill #0

## 2022-07-29 MED ORDER — NALOXONE HCL 4 MG/0.1ML NA LIQD
NASAL | 0 refills | Status: DC
  Filled 2022-07-29: qty 2, 28d supply, fill #0

## 2022-07-29 NOTE — Progress Notes (Signed)
[place]  Telephone:([phone] [phone]   Name: Briggitte Boline Date: 07/29/2022 MRN: [id number]  DOB: 1961/09/05  Patient Care Team: Cammie Sickle, MD as PCP - General (Internal Medicine) Telford Nab, RN as Oncology Nurse Navigator    REASON FOR CONSULTATION: Stacie Kennedy is a 61 y.o. 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  Patient has had severe pain with scapular metastases and was started on XRT.  She is referred to palliative care to help address goals and manage ongoing symptoms.   SOCIAL HISTORY:     reports that she has been smoking cigarettes. She has a 10.75 pack-year smoking history. She has never used smokeless tobacco. She reports current alcohol use of about 2.0 standard drinks of alcohol per week. She reports that she does not use drugs.  Patient lives at home with her boyfriend.  ADVANCE DIRECTIVES:  Does not have  CODE STATUS:   PAST MEDICAL HISTORY: Past Medical History:  Diagnosis Date   Cancer ([place])    lung cancer   Depression    Duodenitis    Dyspnea    Family history of cancer    High cholesterol    Personal history of colonic polyps    Pre-diabetes    Umbilical hernia    UTI (urinary tract infection)     PAST SURGICAL HISTORY:  Past Surgical History:  Procedure Laterality Date   COLONOSCOPY WITH PROPOFOL N/A 03/24/2021   Procedure: COLONOSCOPY WITH PROPOFOL;  Surgeon: Jonathon Bellows, MD;  Location: [facility] ENDOSCOPY;  Service: Gastroenterology;  Laterality: N/A;   INTERCOSTAL NERVE BLOCK  09/19/2020   Procedure: INTERCOSTAL NERVE BLOCK;  Surgeon: Melrose Nakayama, MD;  Location: [facility];  Service: Thoracic;;   IR IMAGING GUIDED PORT INSERTION  09/23/2021   LUNG LOBECTOMY  Right    LUNG REMOVAL, PARTIAL Right 09/19/2020   NODE DISSECTION Right 09/19/2020   Procedure: NODE DISSECTION;  Surgeon: Melrose Nakayama, MD;  Location: [facility];  Service: Thoracic;  Laterality: Right;   VIDEO BRONCHOSCOPY N/A 07/08/2021   Procedure: VIDEO BRONCHOSCOPY;  Surgeon: Melrose Nakayama, MD;  Location: [facility];  Service: Thoracic;  Laterality: N/A;    HEMATOLOGY/ONCOLOGY HISTORY:  Oncology History Overview Note  # NOV -[id number] [LUNG CANCER SCREENING PROGRAM]-18 mm right upper lobe lung nodule; [place] 2021- s/p right upper lobectomy [Dr. Roxan Hockey; [place]]; STAGE: I [pT-18 mm; LN-12=0]; predominant large cell neuroendocrine; minor adenocarcinoma.  Declines adjuvant chemotherapy.  #Recurrent/stage IV cancer NOV 2022- PET scan-scapular lesion liver lesion gastrohepatic lymphadenopathy.  11/21- start RT to right scapular lesion  # DEC 3rd, 2022- CARBO=ETOP+TECEN; udenyca #1; SEP 2023- STOP Tencetriq- progression  SEP 2023-liver biopsy shows neuroendocrine carcinoma/large cell; no adenocarcinoma noted.  Discontinue Tecentriq any progression of disease.  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   # OCTOBER, 2023-  # # MAY 2023- Right swollen eye/orbital inflammation/uveitis s/p Zometa status post steroids improved.-reviewed literature case reports noted; less concerning for tumor involvement.  DISCONTINUE ZOMETA.  NGS: Negative for any targetable mutation; PD-L1 0; KEPASAKE*  # SURVIVORSHIP:   # GENETICS:       Total Number of Primary Tumors: 1  Procedure: Lung lobectomy  Specimen Laterality: Right  Tumor Focality: Unifocal  Tumor Site: Upper lobe  Tumor Size: 1.8 cm  Histologic Type:  Combined large cell neuroendocrine carcinoma with a  minor component of lung adenocarcinoma  Visceral Pleura Invasion: Not identified  Direct Invasion of Adjacent Structures: No adjacent structures present  Lymphovascular Invasion: Not identified   Margins: All margins negative for invasive carcinoma       Closest Margin(s) to Invasive Carcinoma: Bronchovascular margin  Treatment Effect: No known presurgical therapy  Regional Lymph Nodes:       Number of Lymph Nodes Involved: 0                            Nodal Sites with Tumor: Not applicable       Number of Lymph Nodes Examined: 12      Primary cancer of right upper lobe of lung ([place])  11/03/2020 Initial Diagnosis   Primary cancer of right upper lobe of lung ([place])   11/03/2020 Cancer Staging   Staging form: Lung, AJCC 8th Edition - Pathologic: Stage IA3 (pT1c, pN0, cM0) - Signed by Cammie Sickle, MD on 11/04/2020   09/14/2021 - 04/30/2022 Chemotherapy   Patient is on Treatment Plan : LUNG SCLC Carboplatin + Etoposide + Atezolizumab Induction q21d / Atezolizumab Maintenance q21d     09/15/2021 - 06/18/2022 Chemotherapy   Patient is on Treatment Plan : LUNG SCLC Carboplatin + Etoposide + Atezolizumab Induction q21d x 4 cycles / Atezolizumab Maintenance q21d     11/09/2021 Cancer Staging   Staging form: Lung, AJCC 8th Edition - Pathologic: Stage IVB (pTX, pNX, cM1c) - Signed by Cammie Sickle, MD on 11/09/2021   06/22/2022 Pathology Results   Liver biopsy showed metastatic cancer with neuroendocrine features    08/09/2022 -  Chemotherapy   Patient is on Treatment Plan : BRAIN Carboplatin (AUC 5) D1 + Etoposide (120) D1-3 q28d       ALLERGIES:  has No Known Allergies.  MEDICATIONS:  Current Outpatient Medications  Medication Sig Dispense Refill   albuterol (PROVENTIL HFA) 108 (90 Base) MCG/ACT inhaler Inhale 2 puffs into the lungs once every 6 (six) hours as needed for wheezing or shortness of breath. 6.7 g 2   metFORMIN (GLUCOPHAGE) 500 MG tablet Take 500 mg by mouth daily with breakfast.     oxyCODONE-acetaminophen (PERCOCET) 10-325 MG tablet Take 1 tablet by mouth every 8 (eight) hours as needed for pain. 60 tablet 0   predniSONE (DELTASONE) 20 MG tablet Take 1  tablet (20 mg total) by mouth daily with breakfast. Once a day with food. 30 tablet 0   fentaNYL (DURAGESIC) 50 MCG/HR Place 1 patch onto the skin every 3 (three) days. Use 50 mcg patch with 75 mcg patch-[total 125 mcg] every 3 days. (Patient not taking: Reported on 07/29/2022) 10 patch 0   fentaNYL (DURAGESIC) 75 MCG/HR Place 1 patch onto the skin every 3 (three) days. Use 75 mcg patch with 50 mcg patch-[total 125 mcg] every 3 days. (Patient not taking: Reported on 07/29/2022) 10 patch 0   lidocaine (LIDODERM) 5 % Place 1 patch onto the skin daily. Remove & Discard patch within 12 hours or as directed by MD (Patient not taking: Reported on 06/29/2022) 30 patch 2   lidocaine-prilocaine (EMLA) cream Apply one application topically the the affected area(s) daily as needed. 30 g 3   loratadine (CLARITIN) 10 MG tablet Take 1 tablet (10 mg total) by mouth daily. (Patient not taking: Reported on 06/18/2022) 30 tablet 2   ondansetron (ZOFRAN) 4 MG tablet TAKE 2 TABLETS (8MG) BY  MOUTH ONCE EVERY 8 HOURS AS NEEDED FOR NAUSEA OR VOMITING. (Patient not taking: Reported on 06/29/2022) 80 tablet 1   prochlorperazine (COMPAZINE) 10 MG tablet Take 1 tablet (10 mg total) by mouth once every 6 (six) hours as needed for nausea or vomiting. (Patient not taking: Reported on 07/09/2022) 40 tablet 1   No current facility-administered medications for this visit.   Facility-Administered Medications Ordered in Other Visits  Medication Dose Route Frequency Provider Last Rate Last Admin   heparin lock flush 100 unit/mL  500 Units Intravenous Once Charlaine Dalton R, MD       morphine (PF) 2 MG/ML injection            sodium chloride flush (NS) 0.9 % injection 10 mL  10 mL Intravenous PRN Cammie Sickle, MD   10 mL at 03/01/22 0857    VITAL SIGNS: BP 110/76   Pulse 70   Temp (!) 96.3 F (35.7 C) (Tympanic)   Resp 18   Ht 5' 7"  (1.702 m)   Wt 146 lb 9.6 oz (66.5 kg)   BMI 22.96 kg/m  Filed Weights   07/29/22  1416  Weight: 146 lb 9.6 oz (66.5 kg)    Estimated body mass index is 22.96 kg/m as calculated from the following:   Height as of this encounter: 5' 7"  (1.702 m).   Weight as of this encounter: 146 lb 9.6 oz (66.5 kg).  LABS: CBC:    Component Value Date/Time   WBC 8.4 07/26/2022 1156   HGB 15.5 (H) 07/26/2022 1156   HGB 15.5 11/26/2020 1214   HCT 46.7 (H) 07/26/2022 1156   HCT 47.6 (H) 11/26/2020 1214   PLT 312 07/26/2022 1156   PLT 317 11/26/2020 1214   MCV 87.0 07/26/2022 1156   MCV 87 11/26/2020 1214   NEUTROABS 4.5 07/26/2022 1156   NEUTROABS 2.5 11/26/2020 1214   LYMPHSABS 2.9 07/26/2022 1156   LYMPHSABS 3.6 (H) 11/26/2020 1214   MONOABS 0.8 07/26/2022 1156   EOSABS 0.1 07/26/2022 1156   EOSABS 0.2 11/26/2020 1214   BASOSABS 0.0 07/26/2022 1156   BASOSABS 0.1 11/26/2020 1214   Comprehensive Metabolic Panel:    Component Value Date/Time   NA 135 07/26/2022 1156   NA 139 11/26/2020 1214   K 3.8 07/26/2022 1156   CL 103 07/26/2022 1156   CO2 25 07/26/2022 1156   BUN 11 07/26/2022 1156   BUN 8 11/26/2020 1214   CREATININE 0.42 (L) 07/26/2022 1156   GLUCOSE 94 07/26/2022 1156   CALCIUM 9.1 07/26/2022 1156   AST 18 07/26/2022 1156   ALT 9 07/26/2022 1156   ALKPHOS 83 07/26/2022 1156   BILITOT 0.3 07/26/2022 1156   BILITOT 0.3 11/26/2020 1214   PROT 7.4 07/26/2022 1156   PROT 6.9 11/26/2020 1214   ALBUMIN 3.8 07/26/2022 1156   ALBUMIN 4.0 11/26/2020 1214    RADIOGRAPHIC STUDIES: No results found.  PERFORMANCE STATUS (ECOG) : 1 - Symptomatic but completely ambulatory  Review of Systems Unless otherwise noted, a complete review of systems is negative.  Physical Exam General: NAD Pulmonary: Unlabored Extremities: no edema, no joint deformities Skin: no rashes Neurological: Weakness but otherwise nonfocal  IMPRESSION: Patient has had severe right scapular/shoulder pain secondary to known lytic lesion in that area.  She just completed repeat XRT to  that area without significant improvement in pain.  Patient was tearful today as she described persistent and severe pain averaging 8-10 out of 10 in the right scapula/shoulder.  Pain is keeping her awake at night.  Patient was managed on MS Contin and found that helped but became less effective over time.  Most recently, patient was rotated to transdermal fentanyl with eventual dose escalation to 125 mcg (50 mcg +75 mcg patches) the patient states that she had no improvement on that regimen.  She has been taking Percocet 10-326m 6 times daily and says that that will reduce the pain to a tolerable level but effect is short-lived.  Discussed options for adjustment of her pain regimen in detail.  We will liberalize Percocet as needed for breakthrough pain.  We will rotate from fentanyl to OxyContin.  Continue prednisone, which may help given bony metastasis.  Of note, patient has self discontinued her Wellbutrin as she felt like she was taking too many pills.  She does endorse worsening depressive symptoms following discontinuation.  Again, patient was significantly tearful during the visit today with a flat affect.  I suspect that depressive symptoms are potentially exacerbating her perception of pain.  Patient says that she is willing to restart an antidepressant.  We will rotate to duloxetine as this might help with pain if there is a neuropathic component.  Patient declined my offer for referral to LCSW/counseling.  I did speak with IR physician Dr. EDenna Haggardwho agreed to evaluate for possible OsteoCool RFA.  Patient was agreeable to referral.  PDMP reviewed.  Safe storage and administration of pain medications reviewed.  PLAN: -Continue current scope of treatment -Discontinue fentanyl -Start OxyContin 20 mg every 12 hours -Liberalize Percocet 1-2 tablets every 4 hours as needed for breakthrough pain -Referral IR -XR R. Shoulder/scapula -PDMP reviewed -Naloxone prn -Continue daily  prednisone -Daily bowel regimen -Future ACP conversation -Follow up telephone visit 1 week  Case and plan discussed with Dr. BRogue Bussing  Patient expressed understanding and was in agreement with this plan. She also understands that She can call the clinic at any time with any questions, concerns, or complaints.     Time Total: 30 minutes  Visit consisted of counseling and education dealing with the complex and emotionally intense issues of symptom management and palliative care in the setting of serious and potentially life-threatening illness.Greater than 50%  of this time was spent counseling and coordinating care related to the above assessment and plan.  Signed by: JAltha Harm PhD, NP-C

## 2022-07-29 NOTE — Telephone Encounter (Signed)
Encounter opened in error

## 2022-07-30 ENCOUNTER — Other Ambulatory Visit: Payer: Self-pay

## 2022-07-30 ENCOUNTER — Ambulatory Visit: Payer: Medicaid Other | Admitting: Internal Medicine

## 2022-07-30 ENCOUNTER — Ambulatory Visit
Admission: RE | Admit: 2022-07-30 | Discharge: 2022-07-30 | Disposition: A | Discharge status: Home / Self Care | Payer: Medicaid Other | Source: Ambulatory Visit | Attending: Hospice and Palliative Medicine | Admitting: Hospice and Palliative Medicine

## 2022-07-30 ENCOUNTER — Ambulatory Visit: Payer: Medicaid Other

## 2022-07-30 ENCOUNTER — Other Ambulatory Visit: Payer: Medicaid Other

## 2022-07-30 ENCOUNTER — Ambulatory Visit
Admission: RE | Admit: 2022-07-30 | Discharge: 2022-07-30 | Disposition: A | Discharge status: Home / Self Care | Payer: Medicaid Other | Attending: Hospice and Palliative Medicine | Admitting: Hospice and Palliative Medicine

## 2022-07-30 ENCOUNTER — Encounter: Payer: Self-pay | Admitting: Internal Medicine

## 2022-07-30 DIAGNOSIS — C3411 Malignant neoplasm of upper lobe, right bronchus or lung: Secondary | ICD-10-CM | POA: Diagnosis present

## 2022-08-04 ENCOUNTER — Other Ambulatory Visit: Payer: Self-pay | Admitting: Hospice and Palliative Medicine

## 2022-08-04 ENCOUNTER — Other Ambulatory Visit: Payer: Self-pay

## 2022-08-04 MED ORDER — OXYCODONE HCL ER 20 MG PO T12A
20.0000 mg | EXTENDED_RELEASE_TABLET | Freq: Two times a day (BID) | ORAL | 0 refills | Status: DC
  Filled 2022-08-04 – 2022-08-05 (×2): qty 60, 30d supply, fill #0
  Filled 2022-08-05: qty 10, 5d supply, fill #0

## 2022-08-05 ENCOUNTER — Other Ambulatory Visit: Payer: Self-pay

## 2022-08-05 ENCOUNTER — Inpatient Hospital Stay (HOSPITAL_BASED_OUTPATIENT_CLINIC_OR_DEPARTMENT_OTHER): Payer: Medicaid Other | Admitting: Hospice and Palliative Medicine

## 2022-08-05 ENCOUNTER — Encounter: Payer: Self-pay | Admitting: Internal Medicine

## 2022-08-05 DIAGNOSIS — G893 Neoplasm related pain (acute) (chronic): Secondary | ICD-10-CM

## 2022-08-05 DIAGNOSIS — Z515 Encounter for palliative care: Secondary | ICD-10-CM

## 2022-08-05 DIAGNOSIS — C3411 Malignant neoplasm of upper lobe, right bronchus or lung: Secondary | ICD-10-CM | POA: Diagnosis not present

## 2022-08-05 NOTE — Progress Notes (Signed)
Virtual Visit via Telephone Note  I connected with Stacie Kennedy on 08/05/22 at  2:15 PM EDT by telephone and verified that I am speaking with the correct person using two identifiers.  Location: Patient: Home Provider: Clinic   I discussed the limitations, risks, security and privacy concerns of performing an evaluation and management service by telephone and the availability of in person appointments. I also discussed with the patient that there may be a patient responsible charge related to this service. The patient expressed understanding and agreed to proceed.   History of Present Illness: Stacie Kennedy is a 61 y.o. 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  Patient has had severe pain with scapular metastases and was started on XRT.  She is referred to palliative care to help address goals and manage ongoing symptoms.    Observations/Objective: I called and spoke with patient by phone.  She reports that pain improved after initiation of OxyContin with continued use of Percocet as needed for breakthrough pain.  Unfortunately, her insurance would not only initially approve 5 days of OxyContin.  I spoke with Pharm.D. earlier this week who is working on prior authorization for a month supply.  Patient pending IR referral for consideration of OsteoCool RFA. Hopefully, this will also help improve her pain and lessen need for opioids.   Patient denies any other acute changes or concerns today.  Assessment and Plan: Neoplasm related pain -continue OxyContin/Percocet.  Patient pending IR referral for consideration of RFA  Follow Up Instructions: Follow-up with Dr. Rogue Bussing next week. Telephone visit with me in 1 month   I discussed the assessment and treatment plan with the patient. The  patient was provided an opportunity to ask questions and all were answered. The patient agreed with the plan and demonstrated an understanding of the instructions.   The patient was advised to call back or seek an in-person evaluation if the symptoms worsen or if the condition fails to improve as anticipated.  I provided 5 minutes of non-face-to-face time during this encounter.   Irean Hong, NP

## 2022-08-06 MED FILL — Fosaprepitant Dimeglumine For IV Infusion 150 MG (Base Eq): INTRAVENOUS | Qty: 5 | Status: AC

## 2022-08-06 MED FILL — Dexamethasone Sodium Phosphate Inj 100 MG/10ML: INTRAMUSCULAR | Qty: 1 | Status: AC

## 2022-08-09 ENCOUNTER — Other Ambulatory Visit: Payer: Medicaid Other

## 2022-08-09 ENCOUNTER — Other Ambulatory Visit: Payer: Self-pay | Admitting: *Deleted

## 2022-08-09 ENCOUNTER — Other Ambulatory Visit: Payer: Self-pay

## 2022-08-09 ENCOUNTER — Ambulatory Visit: Payer: Medicaid Other

## 2022-08-09 ENCOUNTER — Encounter: Payer: Self-pay | Admitting: Internal Medicine

## 2022-08-09 ENCOUNTER — Inpatient Hospital Stay: Payer: Medicaid Other

## 2022-08-09 ENCOUNTER — Inpatient Hospital Stay (HOSPITAL_BASED_OUTPATIENT_CLINIC_OR_DEPARTMENT_OTHER): Payer: Medicaid Other | Admitting: Internal Medicine

## 2022-08-09 ENCOUNTER — Ambulatory Visit: Payer: Medicaid Other | Admitting: Internal Medicine

## 2022-08-09 VITALS — BP 112/70 | HR 64 | Temp 96.6°F | Resp 19 | Wt 147.0 lb

## 2022-08-09 DIAGNOSIS — Z5111 Encounter for antineoplastic chemotherapy: Secondary | ICD-10-CM | POA: Diagnosis not present

## 2022-08-09 DIAGNOSIS — C3411 Malignant neoplasm of upper lobe, right bronchus or lung: Secondary | ICD-10-CM

## 2022-08-09 LAB — CBC WITH DIFFERENTIAL/PLATELET
Abs Immature Granulocytes: 0.01 10*3/uL (ref 0.00–0.07)
Basophils Absolute: 0 10*3/uL (ref 0.0–0.1)
Basophils Relative: 1 %
Eosinophils Absolute: 0.1 10*3/uL (ref 0.0–0.5)
Eosinophils Relative: 2 %
HCT: 44.4 % (ref 36.0–46.0)
Hemoglobin: 14.6 g/dL (ref 12.0–15.0)
Immature Granulocytes: 0 %
Lymphocytes Relative: 38 %
Lymphs Abs: 2.3 10*3/uL (ref 0.7–4.0)
MCH: 29.1 pg (ref 26.0–34.0)
MCHC: 32.9 g/dL (ref 30.0–36.0)
MCV: 88.6 fL (ref 80.0–100.0)
Monocytes Absolute: 0.7 10*3/uL (ref 0.1–1.0)
Monocytes Relative: 12 %
Neutro Abs: 2.8 10*3/uL (ref 1.7–7.7)
Neutrophils Relative %: 47 %
Platelets: 301 10*3/uL (ref 150–400)
RBC: 5.01 MIL/uL (ref 3.87–5.11)
RDW: 16.5 % — ABNORMAL HIGH (ref 11.5–15.5)
WBC: 6 10*3/uL (ref 4.0–10.5)
nRBC: 0 % (ref 0.0–0.2)

## 2022-08-09 LAB — COMPREHENSIVE METABOLIC PANEL
ALT: 10 U/L (ref 0–44)
AST: 15 U/L (ref 15–41)
Albumin: 3.5 g/dL (ref 3.5–5.0)
Alkaline Phosphatase: 82 U/L (ref 38–126)
Anion gap: 5 (ref 5–15)
BUN: 17 mg/dL (ref 8–23)
CO2: 26 mmol/L (ref 22–32)
Calcium: 9 mg/dL (ref 8.9–10.3)
Chloride: 106 mmol/L (ref 98–111)
Creatinine, Ser: 0.47 mg/dL (ref 0.44–1.00)
GFR, Estimated: >60 mL/min (ref >60)
Glucose, Bld: 113 mg/dL — ABNORMAL HIGH (ref 70–99)
Potassium: 3.5 mmol/L (ref 3.5–5.1)
Sodium: 137 mmol/L (ref 135–145)
Total Bilirubin: <0.1 mg/dL — ABNORMAL LOW (ref 0.3–1.2)
Total Protein: 6.9 g/dL (ref 6.5–8.1)

## 2022-08-09 LAB — TSH: TSH: 1.527 u[IU]/mL (ref 0.350–4.500)

## 2022-08-09 MED ORDER — SODIUM CHLORIDE 0.9 % IV SOLN
10.0000 mg | Freq: Once | INTRAVENOUS | Status: AC
  Administered 2022-08-09: 10 mg via INTRAVENOUS
  Filled 2022-08-09: qty 10

## 2022-08-09 MED ORDER — ACETAMINOPHEN 325 MG PO TABS
650.0000 mg | ORAL_TABLET | Freq: Once | ORAL | Status: AC
  Administered 2022-08-09: 650 mg via ORAL
  Filled 2022-08-09: qty 2

## 2022-08-09 MED ORDER — OXYCODONE HCL ER 20 MG PO T12A
20.0000 mg | EXTENDED_RELEASE_TABLET | Freq: Two times a day (BID) | ORAL | 0 refills | Status: DC
  Filled 2022-08-09: qty 60, 30d supply, fill #0

## 2022-08-09 MED ORDER — SODIUM CHLORIDE 0.9 % IV SOLN
518.0000 mg | Freq: Once | INTRAVENOUS | Status: AC
  Administered 2022-08-09: 520 mg via INTRAVENOUS
  Filled 2022-08-09: qty 52

## 2022-08-09 MED ORDER — SODIUM CHLORIDE 0.9 % IV SOLN
150.0000 mg | Freq: Once | INTRAVENOUS | Status: AC
  Administered 2022-08-09: 150 mg via INTRAVENOUS
  Filled 2022-08-09: qty 150

## 2022-08-09 MED ORDER — HEPARIN SOD (PORK) LOCK FLUSH 100 UNIT/ML IV SOLN
INTRAVENOUS | Status: AC
  Administered 2022-08-09: 500 [IU]
  Filled 2022-08-09: qty 5

## 2022-08-09 MED ORDER — PALONOSETRON HCL INJECTION 0.25 MG/5ML
0.2500 mg | Freq: Once | INTRAVENOUS | Status: AC
  Administered 2022-08-09: 0.25 mg via INTRAVENOUS
  Filled 2022-08-09: qty 5

## 2022-08-09 MED ORDER — SODIUM CHLORIDE 0.9 % IV SOLN
Freq: Once | INTRAVENOUS | Status: AC
  Filled 2022-08-09: qty 250

## 2022-08-09 MED ORDER — SODIUM CHLORIDE 0.9 % IV SOLN
120.0000 mg/m2 | Freq: Once | INTRAVENOUS | Status: AC
  Administered 2022-08-09: 210 mg via INTRAVENOUS
  Filled 2022-08-09: qty 10.5

## 2022-08-09 MED ORDER — FAMOTIDINE IN NACL 20-0.9 MG/50ML-% IV SOLN
20.0000 mg | Freq: Once | INTRAVENOUS | Status: AC
  Administered 2022-08-09: 20 mg via INTRAVENOUS
  Filled 2022-08-09: qty 50

## 2022-08-09 MED ORDER — HEPARIN SOD (PORK) LOCK FLUSH 100 UNIT/ML IV SOLN
500.0000 [IU] | Freq: Once | INTRAVENOUS | Status: AC | PRN
  Filled 2022-08-09: qty 5

## 2022-08-09 MED FILL — Dexamethasone Sodium Phosphate Inj 100 MG/10ML: INTRAMUSCULAR | Qty: 1 | Status: AC

## 2022-08-09 NOTE — Patient Instructions (Signed)
[facility] AT [place]  Discharge Instructions: Thank you for choosing [facility] to provide your oncology and hematology care.  If you have a lab appointment with the [facility], please go directly to the [facility] and check in at the registration area.  Wear comfortable clothing and clothing appropriate for easy access to any Portacath or PICC line.   We strive to give you quality time with your provider. You may need to reschedule your appointment if you arrive late (15 or more minutes).  Arriving late affects you and other patients whose appointments are after yours.  Also, if you miss three or more appointments without notifying the office, you may be dismissed from the clinic at the provider's discretion.      For prescription refill requests, have your pharmacy contact our office and allow 72 hours for refills to be completed.    Today you received the following chemotherapy and/or immunotherapy agents Etoposide and Carboplatin.      To help prevent nausea and vomiting after your treatment, we encourage you to take your nausea medication as directed.  BELOW ARE SYMPTOMS THAT SHOULD BE REPORTED IMMEDIATELY: *FEVER GREATER THAN 100.4 F (38 C) OR HIGHER *CHILLS OR SWEATING *NAUSEA AND VOMITING THAT IS NOT CONTROLLED WITH YOUR NAUSEA MEDICATION *UNUSUAL SHORTNESS OF BREATH *UNUSUAL BRUISING OR BLEEDING *URINARY PROBLEMS (pain or burning when urinating, or frequent urination) *BOWEL PROBLEMS (unusual diarrhea, constipation, pain near the anus) TENDERNESS IN MOUTH AND THROAT WITH OR WITHOUT PRESENCE OF ULCERS (sore throat, sores in mouth, or a toothache) UNUSUAL RASH, SWELLING OR PAIN  UNUSUAL VAGINAL DISCHARGE OR ITCHING   Items with * indicate a potential emergency and should be followed up as soon as possible or go to the Emergency Department if any problems should occur.  Please show the CHEMOTHERAPY ALERT CARD or IMMUNOTHERAPY ALERT CARD  at check-in to the Emergency Department and triage nurse.  Should you have questions after your visit or need to cancel or reschedule your appointment, please contact [facility] [place] AT [place]  [phone] and follow the prompts.  Office hours are 8:00 a.m. to 4:30 p.m. Monday - Friday. Please note that voicemails left after 4:00 p.m. may not be returned until the following business day.  We are closed weekends and major holidays. You have access to a nurse at all times for urgent questions. Please call the main number to the clinic [phone] and follow the prompts.  For any non-urgent questions, you may also contact your provider using MyChart. We now offer e-Visits for anyone 62 and older to request care online for non-urgent symptoms. For details visit [email or web address].   Also download the MyChart app! Go to the app store, search "MyChart", open the app, select [facility], and log in with your MyChart username and password.  Masks are optional in the cancer centers. If you would like for your care team to wear a mask while they are taking care of you, please let them know. For doctor visits, patients may have with them one support person who is at least 61 years old. At this time, visitors are not allowed in the infusion area.

## 2022-08-09 NOTE — Progress Notes (Signed)
Patient states she was given enough Oxycontin for a couple days due to issues with insurance. She doesn't know if the insurance issues was resolved but does need another refill.
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
## 2022-08-09 NOTE — Assessment & Plan Note (Addendum)
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 SEP 6th 2023-PET scan- Two hypermetabolic LEFT hepatic lobe metastasis. Anterior lesion is increased in size from comparison CT;  Intense hypermetabolic RIGHT scapular metastasis with soft tissue extension;  No new areas of metastatic disease. SEP 2023-liver biopsy shows neuroendocrine carcinoma/large cell; no adenocarcinoma noted.  Discontinue Tecentriq any progression of disease.  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 Given the progressive disease-I would recommend chemotherapy with carboplatin etoposide [last received in February 2023]; without immunotherapy [progressed while on Atezo].  # Proceed with cycle #1 of carboplatin etoposide [no Atezo]- Labs today reviewed;  acceptable for treatment today.  Patient understands treatments are palliative not curative.  Again reviewed the potential side effects of chemotherapy including but not limited to decreased bone marrow function; risk of infection bleeding etc. D-4 Ellen Henri.  # mets to bone/ Pain right shoulder- currently s/p RT [ 09/16/2021]; STABLE;  continue percocet 10 mg every 4-6 hours also. S/p  RT on Oct 16th , 2023-. 10 fractions. OFF  fentanyl patch; On oxycontin; and oxycodone as per palliative care. Discussed with Josh.    # Hypokalemia: K- 3.2; discussed re: dietary supp-STABLE.  # constipation:  Miralax BID; fluids; Dulcolax- STABLE.  # DM:  On Metformin.  Monitor closely on steroids.  STABLE.  # COPD: refilled albuterol;STABLE.  #IV access: port functional-  functional   # DISPOSITION:  # chemo today; and this week as planned.  # follow up in 4 weeks-  MD: labs- cbc/cmp; Carbo- Etop; days 2 & 3 Etop; D-4 -udenyca-- Dr.B     .

## 2022-08-10 ENCOUNTER — Inpatient Hospital Stay: Payer: Medicaid Other

## 2022-08-10 VITALS — BP 122/68 | HR 64 | Temp 97.6°F

## 2022-08-10 DIAGNOSIS — Z5111 Encounter for antineoplastic chemotherapy: Secondary | ICD-10-CM | POA: Diagnosis not present

## 2022-08-10 DIAGNOSIS — C3411 Malignant neoplasm of upper lobe, right bronchus or lung: Secondary | ICD-10-CM

## 2022-08-10 MED ORDER — HEPARIN SOD (PORK) LOCK FLUSH 100 UNIT/ML IV SOLN
500.0000 [IU] | Freq: Once | INTRAVENOUS | Status: AC | PRN
  Administered 2022-08-10: 500 [IU]
  Filled 2022-08-10: qty 5

## 2022-08-10 MED ORDER — SODIUM CHLORIDE 0.9 % IV SOLN
120.0000 mg/m2 | Freq: Once | INTRAVENOUS | Status: AC
  Administered 2022-08-10: 210 mg via INTRAVENOUS
  Filled 2022-08-10: qty 10.5

## 2022-08-10 MED ORDER — SODIUM CHLORIDE 0.9 % IV SOLN
Freq: Once | INTRAVENOUS | Status: AC
  Filled 2022-08-10: qty 250

## 2022-08-10 MED ORDER — SODIUM CHLORIDE 0.9 % IV SOLN
10.0000 mg | Freq: Once | INTRAVENOUS | Status: AC
  Administered 2022-08-10: 10 mg via INTRAVENOUS
  Filled 2022-08-10: qty 10

## 2022-08-10 MED FILL — Dexamethasone Sodium Phosphate Inj 100 MG/10ML: INTRAMUSCULAR | Qty: 1 | Status: AC

## 2022-08-11 ENCOUNTER — Ambulatory Visit: Payer: Medicaid Other

## 2022-08-11 ENCOUNTER — Inpatient Hospital Stay: Payer: Medicaid Other | Attending: Internal Medicine

## 2022-08-11 ENCOUNTER — Other Ambulatory Visit: Payer: Self-pay | Admitting: *Deleted

## 2022-08-11 VITALS — BP 149/74 | HR 49 | Temp 97.5°F | Resp 18

## 2022-08-11 DIAGNOSIS — C3411 Malignant neoplasm of upper lobe, right bronchus or lung: Secondary | ICD-10-CM | POA: Diagnosis not present

## 2022-08-11 DIAGNOSIS — E7800 Pure hypercholesterolemia, unspecified: Secondary | ICD-10-CM | POA: Insufficient documentation

## 2022-08-11 DIAGNOSIS — C787 Secondary malignant neoplasm of liver and intrahepatic bile duct: Secondary | ICD-10-CM | POA: Insufficient documentation

## 2022-08-11 DIAGNOSIS — Z7984 Long term (current) use of oral hypoglycemic drugs: Secondary | ICD-10-CM | POA: Diagnosis not present

## 2022-08-11 DIAGNOSIS — E119 Type 2 diabetes mellitus without complications: Secondary | ICD-10-CM | POA: Insufficient documentation

## 2022-08-11 DIAGNOSIS — K5900 Constipation, unspecified: Secondary | ICD-10-CM | POA: Insufficient documentation

## 2022-08-11 DIAGNOSIS — R590 Localized enlarged lymph nodes: Secondary | ICD-10-CM | POA: Diagnosis not present

## 2022-08-11 DIAGNOSIS — C7951 Secondary malignant neoplasm of bone: Secondary | ICD-10-CM | POA: Diagnosis not present

## 2022-08-11 DIAGNOSIS — F17210 Nicotine dependence, cigarettes, uncomplicated: Secondary | ICD-10-CM | POA: Insufficient documentation

## 2022-08-11 DIAGNOSIS — Z7952 Long term (current) use of systemic steroids: Secondary | ICD-10-CM | POA: Diagnosis not present

## 2022-08-11 DIAGNOSIS — R110 Nausea: Secondary | ICD-10-CM | POA: Insufficient documentation

## 2022-08-11 DIAGNOSIS — J449 Chronic obstructive pulmonary disease, unspecified: Secondary | ICD-10-CM | POA: Insufficient documentation

## 2022-08-11 DIAGNOSIS — Z79899 Other long term (current) drug therapy: Secondary | ICD-10-CM | POA: Insufficient documentation

## 2022-08-11 DIAGNOSIS — D492 Neoplasm of unspecified behavior of bone, soft tissue, and skin: Secondary | ICD-10-CM

## 2022-08-11 DIAGNOSIS — E876 Hypokalemia: Secondary | ICD-10-CM | POA: Insufficient documentation

## 2022-08-11 DIAGNOSIS — Z5189 Encounter for other specified aftercare: Secondary | ICD-10-CM | POA: Insufficient documentation

## 2022-08-11 DIAGNOSIS — Z5111 Encounter for antineoplastic chemotherapy: Secondary | ICD-10-CM | POA: Diagnosis not present

## 2022-08-11 MED ORDER — SODIUM CHLORIDE 0.9 % IV SOLN
10.0000 mg | Freq: Once | INTRAVENOUS | Status: AC
  Administered 2022-08-11: 10 mg via INTRAVENOUS
  Filled 2022-08-11: qty 10

## 2022-08-11 MED ORDER — SODIUM CHLORIDE 0.9 % IV SOLN
Freq: Once | INTRAVENOUS | Status: AC
  Filled 2022-08-11: qty 250

## 2022-08-11 MED ORDER — SODIUM CHLORIDE 0.9 % IV SOLN
120.0000 mg/m2 | Freq: Once | INTRAVENOUS | Status: AC
  Administered 2022-08-11: 210 mg via INTRAVENOUS
  Filled 2022-08-11: qty 10.5

## 2022-08-11 MED ORDER — HEPARIN SOD (PORK) LOCK FLUSH 100 UNIT/ML IV SOLN
500.0000 [IU] | Freq: Once | INTRAVENOUS | Status: AC | PRN
  Administered 2022-08-11: 500 [IU]
  Filled 2022-08-11: qty 5

## 2022-08-11 NOTE — Patient Instructions (Signed)
[facility] CANCER CTR AT [place]  Discharge Instructions: Thank you for choosing [facility] to provide your oncology and hematology care.  If you have a lab appointment with the [place], please go directly to the [facility] and check in at the registration area.  Wear comfortable clothing and clothing appropriate for easy access to any Portacath or PICC line.   We strive to give you quality time with your provider. You may need to reschedule your appointment if you arrive late (15 or more minutes).  Arriving late affects you and other patients whose appointments are after yours.  Also, if you miss three or more appointments without notifying the office, you may be dismissed from the clinic at the provider's discretion.      For prescription refill requests, have your pharmacy contact our office and allow 72 hours for refills to be completed.    Today you received the following chemotherapy and/or immunotherapy agents Etoposide      To help prevent nausea and vomiting after your treatment, we encourage you to take your nausea medication as directed.  BELOW ARE SYMPTOMS THAT SHOULD BE REPORTED IMMEDIATELY: *FEVER GREATER THAN 100.4 F (38 C) OR HIGHER *CHILLS OR SWEATING *NAUSEA AND VOMITING THAT IS NOT CONTROLLED WITH YOUR NAUSEA MEDICATION *UNUSUAL SHORTNESS OF BREATH *UNUSUAL BRUISING OR BLEEDING *URINARY PROBLEMS (pain or burning when urinating, or frequent urination) *BOWEL PROBLEMS (unusual diarrhea, constipation, pain near the anus) TENDERNESS IN MOUTH AND THROAT WITH OR WITHOUT PRESENCE OF ULCERS (sore throat, sores in mouth, or a toothache) UNUSUAL RASH, SWELLING OR PAIN  UNUSUAL VAGINAL DISCHARGE OR ITCHING   Items with * indicate a potential emergency and should be followed up as soon as possible or go to the Emergency Department if any problems should occur.  Please show the CHEMOTHERAPY ALERT CARD or IMMUNOTHERAPY ALERT CARD at check-in to  the Emergency Department and triage nurse.  Should you have questions after your visit or need to cancel or reschedule your appointment, please contact [facility] CANCER [place] AT [place]  [phone] and follow the prompts.  Office hours are 8:00 a.m. to 4:30 p.m. Monday - Friday. Please note that voicemails left after 4:00 p.m. may not be returned until the following business day.  We are closed weekends and major holidays. You have access to a nurse at all times for urgent questions. Please call the main number to the clinic [phone] and follow the prompts.  For any non-urgent questions, you may also contact your provider using MyChart. We now offer e-Visits for anyone 65 and older to request care online for non-urgent symptoms. For details visit [email or web address].   Also download the MyChart app! Go to the app store, search "MyChart", open the app, select [facility], and log in with your MyChart username and password.  Masks are optional in the cancer centers. If you would like for your care team to wear a mask while they are taking care of you, please let them know. For doctor visits, patients may have with them one support person who is at least 61 years old. At this time, visitors are not allowed in the infusion area.

## 2022-08-13 ENCOUNTER — Encounter: Payer: Self-pay | Admitting: Internal Medicine

## 2022-08-13 ENCOUNTER — Inpatient Hospital Stay: Payer: Medicaid Other

## 2022-08-13 DIAGNOSIS — Z5111 Encounter for antineoplastic chemotherapy: Secondary | ICD-10-CM | POA: Diagnosis not present

## 2022-08-13 DIAGNOSIS — C3411 Malignant neoplasm of upper lobe, right bronchus or lung: Secondary | ICD-10-CM

## 2022-08-13 MED ORDER — PEGFILGRASTIM-CBQV 6 MG/0.6ML ~~LOC~~ SOSY
6.0000 mg | PREFILLED_SYRINGE | Freq: Once | SUBCUTANEOUS | Status: AC
  Administered 2022-08-13: 6 mg via SUBCUTANEOUS
  Filled 2022-08-13: qty 0.6

## 2022-08-18 ENCOUNTER — Other Ambulatory Visit: Payer: Self-pay | Admitting: *Deleted

## 2022-08-18 ENCOUNTER — Other Ambulatory Visit: Payer: Self-pay | Admitting: Hospice and Palliative Medicine

## 2022-08-18 ENCOUNTER — Other Ambulatory Visit: Payer: Self-pay

## 2022-08-18 DIAGNOSIS — M898X9 Other specified disorders of bone, unspecified site: Secondary | ICD-10-CM

## 2022-08-18 MED ORDER — OXYCODONE-ACETAMINOPHEN 10-325 MG PO TABS
1.0000 | ORAL_TABLET | ORAL | 0 refills | Status: DC | PRN
  Filled 2022-08-18: qty 60, 5d supply, fill #0

## 2022-08-19 ENCOUNTER — Other Ambulatory Visit: Payer: Self-pay

## 2022-08-19 ENCOUNTER — Encounter: Payer: Self-pay | Admitting: Internal Medicine

## 2022-08-19 ENCOUNTER — Ambulatory Visit
Admission: RE | Admit: 2022-08-19 | Discharge: 2022-08-19 | Disposition: A | Discharge status: Home / Self Care | Payer: Medicaid Other | Source: Ambulatory Visit | Attending: Hospice and Palliative Medicine | Admitting: Hospice and Palliative Medicine

## 2022-08-19 DIAGNOSIS — M898X9 Other specified disorders of bone, unspecified site: Secondary | ICD-10-CM

## 2022-08-19 NOTE — Consult Note (Signed)
Chief Complaint: Metastatic lung cancer.   Referring Physician(s): Borders,Joshua R  History of Present Illness: Stacie Kennedy is a 61 y.o. female with past medical history significant for with past medical history significant for Hyperlipidemia and prediabetes with recent diagnosis of recurrent lung cancer, confirmed on ultrasound-guided liver lesion biopsy performed 06/22/2022.    PET/CT performed on 06/15/2022 demonstrated a mixed lytic and sclerotic lesion involving the mid, medial border of the scapula.  Patient has undergone external radiation, completing radiation earlier this week however she remains symptomatic.  As such, the patient has been referred for evaluation for potential image guided osteocool procedure.  Patient states that she started chemotherapy earlier this week which thus far she has been tolerating well.  She is uncertain as to the duration of her chemotherapy.  Presently, patient is on oxycodone 20 mg every 12 hours as well as Percocet 5/325 1 to 2 pills every 4 hours as needed.    She states that her pain is fairly well controlled when she is taking her pain medicine however gets as bad as 10 out of 10 during lapses in her pain regimen.  The patient reports her pain is primary located in the lateral aspect of the mid/proximal humerus.  She reports intermittent right upper extremity tingling though states the symptoms have been present for many years.    She reports difficulty raising her arm over her head, however states this is associated with her pain and improved once her pain medicines have kicked in.  She denies frank weakness.   The patient is right-handed.  Past Medical History:  Diagnosis Date   Cancer [facility])    lung cancer   Depression    Duodenitis    Dyspnea    Family history of cancer    High cholesterol    Personal history of colonic polyps    Pre-diabetes    Umbilical hernia    UTI (urinary tract infection)     Past Surgical History:   Procedure Laterality Date   COLONOSCOPY WITH PROPOFOL N/A 03/24/2021   Procedure: COLONOSCOPY WITH PROPOFOL;  Surgeon: Jonathon Bellows, MD;  Location: [facility] ENDOSCOPY;  Service: Gastroenterology;  Laterality: N/A;   INTERCOSTAL NERVE BLOCK  09/19/2020   Procedure: INTERCOSTAL NERVE BLOCK;  Surgeon: Melrose Nakayama, MD;  Location: [place];  Service: Thoracic;;   IR IMAGING GUIDED PORT INSERTION  09/23/2021   LUNG LOBECTOMY Right    LUNG REMOVAL, PARTIAL Right 09/19/2020   NODE DISSECTION Right 09/19/2020   Procedure: NODE DISSECTION;  Surgeon: Melrose Nakayama, MD;  Location: [place];  Service: Thoracic;  Laterality: Right;   VIDEO BRONCHOSCOPY N/A 07/08/2021   Procedure: VIDEO BRONCHOSCOPY;  Surgeon: Melrose Nakayama, MD;  Location: [facility] OR;  Service: Thoracic;  Laterality: N/A;    Allergies: Patient has no known allergies.  Medications: Prior to Admission medications   Medication Sig Start Date End Date Taking? Authorizing Provider  albuterol (PROVENTIL HFA) 108 (90 Base) MCG/ACT inhaler Inhale 2 puffs into the lungs once every 6 (six) hours as needed for wheezing or shortness of breath. 01/18/22   Cammie Sickle, MD  DULoxetine (CYMBALTA) 30 MG capsule Take 1 capsule (30 mg total) by mouth daily. 07/29/22   Borders, Kirt Boys, NP  lidocaine (LIDODERM) 5 % Place 1 patch onto the skin daily. Remove & Discard patch within 12 hours or as directed by MD Patient not taking: Reported on 06/29/2022 06/18/22   Cammie Sickle, MD  lidocaine-prilocaine (EMLA) cream  Apply one application topically the the affected area(s) daily as needed. 08/31/21   Cammie Sickle, MD  loratadine (CLARITIN) 10 MG tablet Take 1 tablet (10 mg total) by mouth daily. Patient not taking: Reported on 06/18/2022 10/16/21   Cammie Sickle, MD  metFORMIN (GLUCOPHAGE) 500 MG tablet Take 500 mg by mouth daily with breakfast.    [provider]  naloxone (NARCAN) nasal spray 4 mg/0.1 mL SPRAY 1  SPRAY INTO ONE NOSTRIL AS DIRECTED FOR OPIOID OVERDOSE (TURN PERSON ON SIDE AFTER DOSE. IF NO RESPONSE IN 2-3 MINUTES OR PERSON RESPONDS BUT RELAPSES, REPEAT USING A NEW SPRAY DEVICE AND SPRAY INTO THE OTHER NOSTRIL. CALL 911 AFTER USE.) * EMERGENCY USE ONLY * 07/29/22   Borders, Kirt Boys, NP  ondansetron (ZOFRAN) 4 MG tablet TAKE 2 TABLETS (8MG ) BY MOUTH ONCE EVERY 8 HOURS AS NEEDED FOR NAUSEA OR VOMITING. Patient not taking: Reported on 06/29/2022 08/31/21   Cammie Sickle, MD  oxyCODONE (OXYCONTIN) 20 mg 12 hr tablet Take 1 tablet (20 mg total) by mouth every 12 (twelve) hours. 08/09/22   Borders, Kirt Boys, NP  oxyCODONE-acetaminophen (PERCOCET) 10-325 MG tablet Take 1-2 tablets by mouth every 4 (four) hours as needed for pain. 08/18/22   Borders, Kirt Boys, NP  predniSONE (DELTASONE) 20 MG tablet Take 1 tablet (20 mg total) by mouth daily with breakfast. Once a day with food. 07/09/22   Cammie Sickle, MD  prochlorperazine (COMPAZINE) 10 MG tablet Take 1 tablet (10 mg total) by mouth once every 6 (six) hours as needed for nausea or vomiting. Patient not taking: Reported on 07/09/2022 08/31/21   Cammie Sickle, MD     Family History  Problem Relation Age of Onset   Diabetes Mother    Cancer Mother    Diabetes Father    Stroke Father    Heart attack Father        8s   Healthy Sister    Cancer Brother    Hepatitis Brother    Diabetes Brother    Hypertension Brother    Heart disease Brother     Social History   Socioeconomic History   Marital status: Single    Spouse name: Not on file   Number of children: Not on file   Years of education: Not on file   Highest education level: Not on file  Occupational History   Not on file  Tobacco Use   Smoking status: Some Days    Packs/day: 0.25    Years: 43.00    Total pack years: 10.75    Types: Cigarettes   Smokeless tobacco: Never   Tobacco comments:    states she is slowing, trying to quit  Vaping Use   Vaping  Use: Never used  Substance and Sexual Activity   Alcohol use: Yes    Alcohol/week: 2.0 standard drinks of alcohol    Types: 2 Cans of beer per week    Comment: occasional   Drug use: No   Sexual activity: Not on file  Other Topics Concern   Not on file  Social History Narrative   Lives in [place]; smokes; now and then beer; with boy friend. 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    Social Determinants of Health   Financial Resource Strain: High Risk (04/22/2022)   Overall Financial Resource Strain (CARDIA)    Difficulty of Paying Living Expenses: Hard  Food Insecurity: Food Insecurity Present (07/09/2021)   Hunger Vital Sign  Worried About Charity fundraiser in the Last Year: Sometimes true    [employer] of Food in the Last Year: Sometimes true  Transportation Needs: No Transportation Needs (07/09/2021)   PRAPARE - Hydrologist (Medical): No    Lack of Transportation (Non-Medical): No  Physical Activity: Insufficiently Active (04/16/2021)   Exercise Vital Sign    Days of Exercise per Week: 7 days    Minutes of Exercise per Session: 20 min  Stress: Stress Concern Present (04/22/2022)   [place]    Feeling of Stress : Very much  Social Connections: Socially Isolated (04/22/2022)   Social Connection and Isolation Panel [NHANES]    Frequency of Communication with Friends and Family: Once a week    Frequency of Social Gatherings with Friends and Family: Once a week    Attends Religious Services: Never    Marine scientist or Organizations: No    Attends Music therapist: Never    Marital Status: Living with partner    ECOG Status: 1 - Symptomatic but completely ambulatory  Review of Systems: A 12 point ROS discussed and pertinent positives are indicated in the HPI above.  All other systems are negative.  Review of Systems  Vital Signs: BP 110/63 (BP  Location: Left Arm, Patient Position: Sitting)   Pulse 62   Resp 16   SpO2 93%     Physical Exam  Mallampati Score:     Imaging:  The following examinations were personally reviewed in detail: Right shoulder and scapular radiographs-07/30/2022 PET/CT-06/15/2022 CT of the chest, abdomen pelvis-03/16/2022; 11/27/2021; 08/13/2021 chest CT-05/21/2019  DG Scapula Right  Result Date: 08/02/2022 CLINICAL DATA:  Metastatic lung cancer.  Scapular metastatic lesion EXAM: RIGHT SCAPULA - 2+ VIEWS; RIGHT SHOULDER - 2+ VIEW COMPARISON:  CT 03/16/2022, x-ray 05/21/2022 FINDINGS: Expansile metastatic lesion in the right scapular body with mixed lytic and sclerotic appearance. The degree of sclerosis appears increased from the previous radiographs which may indicate healing response. Lesion measures approximately 5.4 x 4.4 cm, similar to prior. No new lytic or sclerotic bone lesions are identified. A chest port is present. Right shoulder intact without fracture or dislocation. Moderate arthropathy at the [facility] joint. IMPRESSION: 1. No acute fracture or dislocation of the right shoulder or scapula. 2. Expansile metastatic lesion in the right scapular body is similar to prior. The degree of sclerosis appears slightly increased from previous radiographs which may indicate healing response. Electronically Signed   By: Davina Poke D.O.   On: 08/02/2022 16:58   DG Shoulder Right  Result Date: 08/02/2022 CLINICAL DATA:  Metastatic lung cancer.  Scapular metastatic lesion EXAM: RIGHT SCAPULA - 2+ VIEWS; RIGHT SHOULDER - 2+ VIEW COMPARISON:  CT 03/16/2022, x-ray 05/21/2022 FINDINGS: Expansile metastatic lesion in the right scapular body with mixed lytic and sclerotic appearance. The degree of sclerosis appears increased from the previous radiographs which may indicate healing response. Lesion measures approximately 5.4 x 4.4 cm, similar to prior. No new lytic or sclerotic bone lesions are identified. A chest port is  present. Right shoulder intact without fracture or dislocation. Moderate arthropathy at the [facility] joint. IMPRESSION: 1. No acute fracture or dislocation of the right shoulder or scapula. 2. Expansile metastatic lesion in the right scapular body is similar to prior. The degree of sclerosis appears slightly increased from previous radiographs which may indicate healing response. Electronically Signed   By: Davina Poke D.O.  On: 08/02/2022 16:58    Labs:  CBC: Recent Labs    06/22/22 0937 06/29/22 1048 07/26/22 1156 08/09/22 0932  WBC 6.8 6.1 8.4 6.0  HGB 15.0 15.6* 15.5* 14.6  HCT 45.2 47.3* 46.7* 44.4  PLT 320 325 312 301    COAGS: Recent Labs    06/22/22 0937  INR 1.0    BMP: Recent Labs    06/18/22 0905 06/29/22 1048 07/26/22 1156 08/09/22 0932  NA 137 136 135 137  K 3.7 3.8 3.8 3.5  CL 106 107 103 106  CO2 27 24 25 26   GLUCOSE 86 106* 94 113*  BUN 16 16 11 17   CALCIUM 8.8* 9.0 9.1 9.0  CREATININE 0.51 0.58 0.42* 0.47  GFRNONAA >60 >60 >60 >60    LIVER FUNCTION TESTS: Recent Labs    06/18/22 0905 06/29/22 1048 07/26/22 1156 08/09/22 0932  BILITOT 0.2* 0.4 0.3 <0.1*  AST 15 17 18 15   ALT 7 9 9 10   ALKPHOS 74 85 83 82  PROT 7.0 7.8 7.4 6.9  ALBUMIN 3.7 4.1 3.8 3.5    TUMOR MARKERS: No results for input(s): "AFPTM", "CEA", "CA199", "CHROMGRNA" in the last 8760 hours.  Assessment and Plan:  Stacie Kennedy is a 61 y.o. female with past medical history significant for with past medical history significant for Hyperlipidemia and prediabetes with recent diagnosis of recurrent lung cancer, confirmed on ultrasound-guided liver lesion biopsy performed 06/22/2022.    PET/CT performed on 06/15/2022 demonstrated a mixed lytic and sclerotic lesion involving the mid, medial border of the scapula.  Patient has undergone external radiation, completing radiation earlier this week however she remains symptomatic.  As such, the patient has been referred for evaluation for  potential image guided Osteocool procedure.  The following examinations were personally reviewed in detail: Right shoulder and scapular radiographs-07/30/2022 PET/CT-06/15/2022 CT of the chest, abdomen pelvis-03/16/2022; 11/27/2021; 08/13/2021 chest CT-05/21/2019  Provided images demonstrated an at least 4.7 x 2.7 cm mixed lytic and sclerotic lesion involving the medial border of the scapula (PET/CT-image 71, series 2) noted to be high markedly hypermetabolic with SUV max of 9.3.  The lesion is remote from the glenoid process and does not definitively invade the adjacent chest wall though there is an apparent adjacent soft tissue mass most conspicuous involving it's superficial aspect.    The patient complains of right lateral upper arm pain, however a definitive area of abnormal hypermetabolic activity is not identified and no discrete lesion is identified on  dedicated right humerus radiographs performed 07/30/2022 and as such, the symptoms may be referred from the scapula.  Prolonged conversations were held with the patient regarding potential management options.   I explained that given the location, the patient is likely a poor operative candidate for definitive resection.  As such, I explained that if the external radiation and subsequent chemotherapy does not result in significant improvement in her pain, an osteocool procedure may be performed for palliative purposes.  I explained that an osteocool procedure entails utilizing CT guidance to place 3 or 4 RF ablation probes within the scapular mass and utilizing RF ablative technology for a localized treatment.  I explained that as the lesion is not weightbearing, cement augmentation would likely not be required.  I explained that this procedure is classically performed in the spine for vertebral body lesions however we could offer it in the scapula for palliative purposes though it is an atypical location.  As the patient has just completed radiation  therapy and is  just started chemotherapy I feel it is most prudent to see if she has a subjective improvement following the initiation of these treatment options.  Ultimately, if her pain is well controlled with her already received radiation therapy, the initiation of chemotherapy and her current pain regiment,  dedicated intervention may not be indicated as this is an atypical location for treatment.  Ultimately, if her pain persists we could offer this treatment, and If so, this procedure will be performed at [facility] long hospital with general anesthesia.  In the meantime, I will initially proceed with obtaining a contrast enhanced right scapula and humerus MRI to better evaluate the extent and soft tissue component of the scapular mass as well as to ensure there is no satellite lesion involving the proximal humerus as that is where the majority of her pain resides though there is no definitive correlate on dedicated radiograph performed 07/30/2022.  Following this prolonged and detailed conversation, the patient is agreement with the proposed plan of care.  Plan: - Repeat telemedicine consultation following acquisition of contrast-enhanced MRI of the right scapula and humerus.    Thank you for this interesting consult.  I greatly enjoyed meeting Pernie Grosso and look forward to participating in their care.  A copy of this report was sent to the requesting provider on this date.  Electronically Signed: Sandi Mariscal 08/19/2022, 12:08 PM   I spent a total of 30 Minutes in face to face in clinical consultation, greater than 50% of which was counseling/coordinating care for right scapular mass

## 2022-08-20 ENCOUNTER — Ambulatory Visit: Payer: Medicaid Other

## 2022-08-20 ENCOUNTER — Ambulatory Visit: Payer: Medicaid Other | Admitting: Internal Medicine

## 2022-08-20 ENCOUNTER — Other Ambulatory Visit: Payer: Medicaid Other

## 2022-08-20 ENCOUNTER — Other Ambulatory Visit: Payer: Self-pay | Admitting: Interventional Radiology

## 2022-08-20 DIAGNOSIS — C3490 Malignant neoplasm of unspecified part of unspecified bronchus or lung: Secondary | ICD-10-CM

## 2022-08-20 DIAGNOSIS — M898X2 Other specified disorders of bone, upper arm: Secondary | ICD-10-CM

## 2022-08-20 DIAGNOSIS — R2232 Localized swelling, mass and lump, left upper limb: Secondary | ICD-10-CM

## 2022-08-25 ENCOUNTER — Ambulatory Visit: Payer: Medicaid Other | Admitting: Occupational Therapy

## 2022-08-26 ENCOUNTER — Ambulatory Visit: Payer: Medicaid Other | Admitting: Radiation Oncology

## 2022-09-01 MED FILL — Fosaprepitant Dimeglumine For IV Infusion 150 MG (Base Eq): INTRAVENOUS | Qty: 5 | Status: AC

## 2022-09-01 MED FILL — Dexamethasone Sodium Phosphate Inj 100 MG/10ML: INTRAMUSCULAR | Qty: 1 | Status: AC

## 2022-09-06 ENCOUNTER — Inpatient Hospital Stay: Payer: Medicaid Other

## 2022-09-06 ENCOUNTER — Encounter: Payer: Self-pay | Admitting: Internal Medicine

## 2022-09-06 ENCOUNTER — Other Ambulatory Visit: Payer: Self-pay

## 2022-09-06 ENCOUNTER — Other Ambulatory Visit: Payer: Self-pay | Admitting: *Deleted

## 2022-09-06 ENCOUNTER — Inpatient Hospital Stay (HOSPITAL_BASED_OUTPATIENT_CLINIC_OR_DEPARTMENT_OTHER): Payer: Medicaid Other | Admitting: Internal Medicine

## 2022-09-06 VITALS — BP 122/82 | HR 67 | Temp 98.8°F | Resp 16 | Ht 67.0 in | Wt 146.6 lb

## 2022-09-06 DIAGNOSIS — C3411 Malignant neoplasm of upper lobe, right bronchus or lung: Secondary | ICD-10-CM

## 2022-09-06 DIAGNOSIS — R0602 Shortness of breath: Secondary | ICD-10-CM

## 2022-09-06 DIAGNOSIS — Z5111 Encounter for antineoplastic chemotherapy: Secondary | ICD-10-CM | POA: Diagnosis not present

## 2022-09-06 LAB — COMPREHENSIVE METABOLIC PANEL
ALT: 8 U/L (ref 0–44)
AST: 13 U/L — ABNORMAL LOW (ref 15–41)
Albumin: 3.5 g/dL (ref 3.5–5.0)
Alkaline Phosphatase: 83 U/L (ref 38–126)
Anion gap: 6 (ref 5–15)
BUN: 9 mg/dL (ref 8–23)
CO2: 24 mmol/L (ref 22–32)
Calcium: 8.8 mg/dL — ABNORMAL LOW (ref 8.9–10.3)
Chloride: 105 mmol/L (ref 98–111)
Creatinine, Ser: 0.45 mg/dL (ref 0.44–1.00)
GFR, Estimated: >60 mL/min (ref >60)
Glucose, Bld: 102 mg/dL — ABNORMAL HIGH (ref 70–99)
Potassium: 3.6 mmol/L (ref 3.5–5.1)
Sodium: 135 mmol/L (ref 135–145)
Total Bilirubin: 0.4 mg/dL (ref 0.3–1.2)
Total Protein: 7 g/dL (ref 6.5–8.1)

## 2022-09-06 LAB — CBC WITH DIFFERENTIAL/PLATELET
Abs Immature Granulocytes: 0.01 10*3/uL (ref 0.00–0.07)
Basophils Absolute: 0.1 10*3/uL (ref 0.0–0.1)
Basophils Relative: 1 %
Eosinophils Absolute: 0 10*3/uL (ref 0.0–0.5)
Eosinophils Relative: 0 %
HCT: 40 % (ref 36.0–46.0)
Hemoglobin: 13.4 g/dL (ref 12.0–15.0)
Immature Granulocytes: 0 %
Lymphocytes Relative: 29 %
Lymphs Abs: 2.3 10*3/uL (ref 0.7–4.0)
MCH: 29.3 pg (ref 26.0–34.0)
MCHC: 33.5 g/dL (ref 30.0–36.0)
MCV: 87.3 fL (ref 80.0–100.0)
Monocytes Absolute: 1.1 10*3/uL — ABNORMAL HIGH (ref 0.1–1.0)
Monocytes Relative: 14 %
Neutro Abs: 4.5 10*3/uL (ref 1.7–7.7)
Neutrophils Relative %: 56 %
Platelets: 531 10*3/uL — ABNORMAL HIGH (ref 150–400)
RBC: 4.58 MIL/uL (ref 3.87–5.11)
RDW: 16.9 % — ABNORMAL HIGH (ref 11.5–15.5)
WBC: 8 10*3/uL (ref 4.0–10.5)
nRBC: 0 % (ref 0.0–0.2)

## 2022-09-06 LAB — TSH: TSH: 1.058 u[IU]/mL (ref 0.350–4.500)

## 2022-09-06 MED ORDER — ALBUTEROL SULFATE HFA 108 (90 BASE) MCG/ACT IN AERS
2.0000 | INHALATION_SPRAY | Freq: Four times a day (QID) | RESPIRATORY_TRACT | 2 refills | Status: DC | PRN
  Filled 2022-09-06: qty 18, 25d supply, fill #0

## 2022-09-06 MED ORDER — SODIUM CHLORIDE 0.9 % IV SOLN
Freq: Once | INTRAVENOUS | Status: AC
  Filled 2022-09-06: qty 250

## 2022-09-06 MED ORDER — SODIUM CHLORIDE 0.9 % IV SOLN
150.0000 mg | Freq: Once | INTRAVENOUS | Status: AC
  Administered 2022-09-06: 150 mg via INTRAVENOUS
  Filled 2022-09-06: qty 150

## 2022-09-06 MED ORDER — OXYCODONE HCL ER 20 MG PO T12A
20.0000 mg | EXTENDED_RELEASE_TABLET | Freq: Two times a day (BID) | ORAL | 0 refills | Status: DC
  Filled 2022-09-06: qty 60, 30d supply, fill #0

## 2022-09-06 MED ORDER — SODIUM CHLORIDE 0.9 % IV SOLN
120.0000 mg/m2 | Freq: Once | INTRAVENOUS | Status: AC
  Administered 2022-09-06: 210 mg via INTRAVENOUS
  Filled 2022-09-06: qty 10.5

## 2022-09-06 MED ORDER — PALONOSETRON HCL INJECTION 0.25 MG/5ML
0.2500 mg | Freq: Once | INTRAVENOUS | Status: AC
  Administered 2022-09-06: 0.25 mg via INTRAVENOUS
  Filled 2022-09-06: qty 5

## 2022-09-06 MED ORDER — FAMOTIDINE IN NACL 20-0.9 MG/50ML-% IV SOLN
20.0000 mg | Freq: Once | INTRAVENOUS | Status: AC
  Administered 2022-09-06: 20 mg via INTRAVENOUS
  Filled 2022-09-06: qty 50

## 2022-09-06 MED ORDER — HEPARIN SOD (PORK) LOCK FLUSH 100 UNIT/ML IV SOLN
INTRAVENOUS | Status: AC
  Filled 2022-09-06: qty 5

## 2022-09-06 MED ORDER — SODIUM CHLORIDE 0.9% FLUSH
10.0000 mL | INTRAVENOUS | Status: DC | PRN
  Administered 2022-09-06: 10 mL
  Filled 2022-09-06: qty 10

## 2022-09-06 MED ORDER — OXYCODONE-ACETAMINOPHEN 10-325 MG PO TABS
1.0000 | ORAL_TABLET | ORAL | 0 refills | Status: DC | PRN
  Filled 2022-09-06: qty 60, 5d supply, fill #0

## 2022-09-06 MED ORDER — SODIUM CHLORIDE 0.9 % IV SOLN
10.0000 mg | Freq: Once | INTRAVENOUS | Status: AC
  Administered 2022-09-06: 10 mg via INTRAVENOUS
  Filled 2022-09-06: qty 10

## 2022-09-06 MED ORDER — ACETAMINOPHEN 325 MG PO TABS
650.0000 mg | ORAL_TABLET | Freq: Once | ORAL | Status: AC
  Administered 2022-09-06: 650 mg via ORAL
  Filled 2022-09-06: qty 2

## 2022-09-06 MED ORDER — HEPARIN SOD (PORK) LOCK FLUSH 100 UNIT/ML IV SOLN
500.0000 [IU] | Freq: Once | INTRAVENOUS | Status: AC | PRN
  Administered 2022-09-06: 500 [IU]
  Filled 2022-09-06: qty 5

## 2022-09-06 MED ORDER — SODIUM CHLORIDE 0.9 % IV SOLN
518.0000 mg | Freq: Once | INTRAVENOUS | Status: AC
  Administered 2022-09-06: 520 mg via INTRAVENOUS
  Filled 2022-09-06: qty 52

## 2022-09-06 MED FILL — Dexamethasone Sodium Phosphate Inj 100 MG/10ML: INTRAMUSCULAR | Qty: 1 | Status: AC

## 2022-09-06 NOTE — Progress Notes (Signed)
Not taking Cymbalta due to making her feel sick with loss of appetite  Refill for Albuterol pended for Med review.

## 2022-09-06 NOTE — Assessment & Plan Note (Addendum)
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 SEP 6th 2023-PET scan- Two hypermetabolic LEFT hepatic lobe metastasis. Anterior lesion is increased in size from comparison CT;  Intense hypermetabolic RIGHT scapular metastasis with soft tissue extension;  No new areas of metastatic disease. SEP 2023-liver biopsy shows neuroendocrine carcinoma/large cell; no adenocarcinoma noted.   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 Given the progressive disease-patient currently on chemotherapy with carboplatin etoposide [last received in February 2023]; without immunotherapy [progressed while on Atezo].  # Proceed with cycle #2.  of carboplatin etoposide [no Atezo]- Labs today reviewed;  acceptable for treatment today. D-4 Ellen Henri. Will do a scan after 3rd cycle; will order next visit.   # mets to bone/ Pain right shoulder- currently s/p RT [ 09/16/2021]; STABLE;  continue percocet 10 mg every 4-6 hours also. S/p  RT on Oct 16th , 2023-. 10 fractions. OFF  fentanyl patch; On oxycontin; and oxycodone as per palliative care. STABLE. Discussed with Josh.  Refilled by Merrily Pew. Wants to Hold off IR ablation as pain is improved.   # Hypokalemia: K- 3.6; discussed re: dietary supp-STABLE.  # constipation:  Miralax BID; fluids; Dulcolax- STABLE.  # DM:  On Metformin.  Monitor closely on steroids.  STABLE.  # COPD: refilled albuterol;STABLE.  #IV access: port functional-  functional  # DISPOSITION:  # chemo today; and this week as planned.  # follow up in 3 weeks-  MD: labs- cbc/cmp; Carbo- Etop; days 2 & 3 Etop; D-4 -Ellen Henri-- Dr.B
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
## 2022-09-06 NOTE — Patient Instructions (Signed)
[facility] CANCER CTR AT [place]  Discharge Instructions: Thank you for choosing [place] to provide your oncology and hematology care.  If you have a lab appointment with the [place], please go directly to the [place] and check in at the registration area.  Wear comfortable clothing and clothing appropriate for easy access to any Portacath or PICC line.   We strive to give you quality time with your provider. You may need to reschedule your appointment if you arrive late (15 or more minutes).  Arriving late affects you and other patients whose appointments are after yours.  Also, if you miss three or more appointments without notifying the office, you may be dismissed from the clinic at the provider's discretion.      For prescription refill requests, have your pharmacy contact our office and allow 72 hours for refills to be completed.    Today you received the following chemotherapy and/or immunotherapy agents Etoposide   & carboplatin    To help prevent nausea and vomiting after your treatment, we encourage you to take your nausea medication as directed.  BELOW ARE SYMPTOMS THAT SHOULD BE REPORTED IMMEDIATELY: *FEVER GREATER THAN 100.4 F (38 C) OR HIGHER *CHILLS OR SWEATING *NAUSEA AND VOMITING THAT IS NOT CONTROLLED WITH YOUR NAUSEA MEDICATION *UNUSUAL SHORTNESS OF BREATH *UNUSUAL BRUISING OR BLEEDING *URINARY PROBLEMS (pain or burning when urinating, or frequent urination) *BOWEL PROBLEMS (unusual diarrhea, constipation, pain near the anus) TENDERNESS IN MOUTH AND THROAT WITH OR WITHOUT PRESENCE OF ULCERS (sore throat, sores in mouth, or a toothache) UNUSUAL RASH, SWELLING OR PAIN  UNUSUAL VAGINAL DISCHARGE OR ITCHING   Items with * indicate a potential emergency and should be followed up as soon as possible or go to the Emergency Department if any problems should occur.  Please show the CHEMOTHERAPY ALERT CARD or IMMUNOTHERAPY ALERT CARD at  check-in to the Emergency Department and triage nurse.  Should you have questions after your visit or need to cancel or reschedule your appointment, please contact [facility] CANCER [place] AT [place]  [phone] and follow the prompts.  Office hours are 8:00 a.m. to 4:30 p.m. Monday - Friday. Please note that voicemails left after 4:00 p.m. may not be returned until the following business day.  We are closed weekends and major holidays. You have access to a nurse at all times for urgent questions. Please call the main number to the clinic [phone] and follow the prompts.  For any non-urgent questions, you may also contact your provider using MyChart. We now offer e-Visits for anyone 45 and older to request care online for non-urgent symptoms. For details visit [email or web address].   Also download the MyChart app! Go to the app store, search "MyChart", open the app, select [place], and log in with your MyChart username and password.  Masks are optional in the cancer centers. If you would like for your care team to wear a mask while they are taking care of you, please let them know. For doctor visits, patients may have with them one support person who is at least 61 years old. At this time, visitors are not allowed in the infusion area.

## 2022-09-07 ENCOUNTER — Inpatient Hospital Stay: Payer: Medicaid Other

## 2022-09-07 ENCOUNTER — Inpatient Hospital Stay (HOSPITAL_BASED_OUTPATIENT_CLINIC_OR_DEPARTMENT_OTHER): Payer: Medicaid Other | Admitting: Hospice and Palliative Medicine

## 2022-09-07 ENCOUNTER — Other Ambulatory Visit: Payer: Self-pay

## 2022-09-07 VITALS — BP 111/65 | HR 59 | Temp 97.5°F | Resp 18

## 2022-09-07 DIAGNOSIS — Z515 Encounter for palliative care: Secondary | ICD-10-CM | POA: Diagnosis not present

## 2022-09-07 DIAGNOSIS — Z5111 Encounter for antineoplastic chemotherapy: Secondary | ICD-10-CM | POA: Diagnosis not present

## 2022-09-07 DIAGNOSIS — C3411 Malignant neoplasm of upper lobe, right bronchus or lung: Secondary | ICD-10-CM

## 2022-09-07 MED ORDER — SODIUM CHLORIDE 0.9% FLUSH
10.0000 mL | INTRAVENOUS | Status: DC | PRN
  Filled 2022-09-07: qty 10

## 2022-09-07 MED ORDER — SODIUM CHLORIDE 0.9 % IV SOLN
120.0000 mg/m2 | Freq: Once | INTRAVENOUS | Status: AC
  Administered 2022-09-07: 210 mg via INTRAVENOUS
  Filled 2022-09-07: qty 10.5

## 2022-09-07 MED ORDER — HEPARIN SOD (PORK) LOCK FLUSH 100 UNIT/ML IV SOLN
500.0000 [IU] | Freq: Once | INTRAVENOUS | Status: AC | PRN
  Administered 2022-09-07: 500 [IU]
  Filled 2022-09-07: qty 5

## 2022-09-07 MED ORDER — SODIUM CHLORIDE 0.9 % IV SOLN
Freq: Once | INTRAVENOUS | Status: AC
  Filled 2022-09-07: qty 250

## 2022-09-07 MED ORDER — SODIUM CHLORIDE 0.9 % IV SOLN
10.0000 mg | Freq: Once | INTRAVENOUS | Status: AC
  Administered 2022-09-07: 10 mg via INTRAVENOUS
  Filled 2022-09-07: qty 10

## 2022-09-07 MED FILL — Dexamethasone Sodium Phosphate Inj 100 MG/10ML: INTRAMUSCULAR | Qty: 1 | Status: AC

## 2022-09-07 NOTE — Addendum Note (Signed)
Addended by: Altha Harm R on: 09/07/2022 01:37 PM   Modules accepted: Level of Service

## 2022-09-07 NOTE — Patient Instructions (Signed)
[facility] CANCER CTR AT [place]  Discharge Instructions: Thank you for choosing [place] to provide your oncology and hematology care.  If you have a lab appointment with the [place], please go directly to the [place] and check in at the registration area.  Wear comfortable clothing and clothing appropriate for easy access to any Portacath or PICC line.   We strive to give you quality time with your provider. You may need to reschedule your appointment if you arrive late (15 or more minutes).  Arriving late affects you and other patients whose appointments are after yours.  Also, if you miss three or more appointments without notifying the office, you may be dismissed from the clinic at the provider's discretion.      For prescription refill requests, have your pharmacy contact our office and allow 72 hours for refills to be completed.    Today you received the following chemotherapy and/or immunotherapy agents- etoposide      To help prevent nausea and vomiting after your treatment, we encourage you to take your nausea medication as directed.  BELOW ARE SYMPTOMS THAT SHOULD BE REPORTED IMMEDIATELY: *FEVER GREATER THAN 100.4 F (38 C) OR HIGHER *CHILLS OR SWEATING *NAUSEA AND VOMITING THAT IS NOT CONTROLLED WITH YOUR NAUSEA MEDICATION *UNUSUAL SHORTNESS OF BREATH *UNUSUAL BRUISING OR BLEEDING *URINARY PROBLEMS (pain or burning when urinating, or frequent urination) *BOWEL PROBLEMS (unusual diarrhea, constipation, pain near the anus) TENDERNESS IN MOUTH AND THROAT WITH OR WITHOUT PRESENCE OF ULCERS (sore throat, sores in mouth, or a toothache) UNUSUAL RASH, SWELLING OR PAIN  UNUSUAL VAGINAL DISCHARGE OR ITCHING   Items with * indicate a potential emergency and should be followed up as soon as possible or go to the Emergency Department if any problems should occur.  Please show the CHEMOTHERAPY ALERT CARD or IMMUNOTHERAPY ALERT CARD at check-in to  the Emergency Department and triage nurse.  Should you have questions after your visit or need to cancel or reschedule your appointment, please contact [facility] CANCER [place] AT [place]  [phone] and follow the prompts.  Office hours are 8:00 a.m. to 4:30 p.m. Monday - Friday. Please note that voicemails left after 4:00 p.m. may not be returned until the following business day.  We are closed weekends and major holidays. You have access to a nurse at all times for urgent questions. Please call the main number to the clinic [phone] and follow the prompts.  For any non-urgent questions, you may also contact your provider using MyChart. We now offer e-Visits for anyone 29 and older to request care online for non-urgent symptoms. For details visit [email or web address].   Also download the MyChart app! Go to the app store, search "MyChart", open the app, select [place], and log in with your MyChart username and password.  Masks are optional in the cancer centers. If you would like for your care team to wear a mask while they are taking care of you, please let them know. For doctor visits, patients may have with them one support person who is at least 61 years old. At this time, visitors are not allowed in the infusion area.

## 2022-09-07 NOTE — Progress Notes (Addendum)
[place]  [phone] [phone]) [phone]   Name: Cheryel Kyte Date: 09/07/2022 MRN: [id number]  DOB: [birth year]  Patient Care Team: Cammie Sickle, MD as PCP - General (Internal Medicine) Telford Nab, RN as Oncology Nurse Navigator    REASON FOR CONSULTATION: Stacie Kennedy is a 61 y.o. 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  Patient has had severe pain with scapular metastases and was started on XRT.  She is referred to palliative care to help address goals and manage ongoing symptoms.   SOCIAL HISTORY:     reports that she has been smoking cigarettes. She has a 10.75 pack-year smoking history. She has never used smokeless tobacco. She reports current alcohol use of about 2.0 standard drinks of alcohol per week. She reports that she does not use drugs.  Patient lives at home with her boyfriend.  ADVANCE DIRECTIVES:  Does not have  CODE STATUS:   PAST MEDICAL HISTORY: Past Medical History:  Diagnosis Date   Cancer ([place])    lung cancer   Depression    Duodenitis    Dyspnea    Family history of cancer    High cholesterol    Personal history of colonic polyps    Pre-diabetes    Umbilical hernia    UTI (urinary tract infection)     PAST SURGICAL HISTORY:  Past Surgical History:  Procedure Laterality Date   COLONOSCOPY WITH PROPOFOL N/A 03/24/2021   Procedure: COLONOSCOPY WITH PROPOFOL;  Surgeon: Jonathon Bellows, MD;  Location: [facility] ENDOSCOPY;  Service: Gastroenterology;  Laterality: N/A;   INTERCOSTAL NERVE BLOCK  09/19/2020   Procedure: INTERCOSTAL NERVE BLOCK;  Surgeon: Melrose Nakayama, MD;  Location: [facility];  Service: Thoracic;;   IR IMAGING GUIDED PORT INSERTION  09/23/2021   LUNG LOBECTOMY  Right    LUNG REMOVAL, PARTIAL Right 09/19/2020   NODE DISSECTION Right 09/19/2020   Procedure: NODE DISSECTION;  Surgeon: Melrose Nakayama, MD;  Location: [facility];  Service: Thoracic;  Laterality: Right;   VIDEO BRONCHOSCOPY N/A 07/08/2021   Procedure: VIDEO BRONCHOSCOPY;  Surgeon: Melrose Nakayama, MD;  Location: [facility];  Service: Thoracic;  Laterality: N/A;    HEMATOLOGY/ONCOLOGY HISTORY:  Oncology History Overview Note  # NOV -[id number] [LUNG CANCER SCREENING PROGRAM]-18 mm right upper lobe lung nodule; [place] 2021- s/p right upper lobectomy [Dr. Roxan Hockey; [place]]; STAGE: I [pT-18 mm; LN-12=0]; predominant large cell neuroendocrine; minor adenocarcinoma.  Declines adjuvant chemotherapy.  #Recurrent/stage IV cancer NOV 2022- PET scan-scapular lesion liver lesion gastrohepatic lymphadenopathy.  11/21- start RT to right scapular lesion  # DEC 3rd, 2022- CARBO=ETOP+TECEN; udenyca #1; SEP 2023- STOP Tencetriq- progression  SEP 2023-liver biopsy shows neuroendocrine carcinoma/large cell; no adenocarcinoma noted.  Discontinue Tecentriq any progression of disease.  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   # OCTOBER, 2023-  # # MAY 2023- Right swollen eye/orbital inflammation/uveitis s/p Zometa status post steroids improved.-reviewed literature case reports noted; less concerning for tumor involvement.  DISCONTINUE ZOMETA.  NGS: Negative for any targetable mutation; PD-L1 0; KEPASAKE*  # SURVIVORSHIP:   # GENETICS:       Total Number of Primary Tumors: 1  Procedure: Lung lobectomy  Specimen Laterality: Right  Tumor Focality: Unifocal  Tumor Site: Upper lobe  Tumor Size: 1.8 cm  Histologic Type:  Combined large cell neuroendocrine carcinoma with a  minor component of lung adenocarcinoma  Visceral Pleura Invasion: Not identified  Direct Invasion of Adjacent Structures: No adjacent structures present  Lymphovascular Invasion: Not identified   Margins: All margins negative for invasive carcinoma       Closest Margin(s) to Invasive Carcinoma: Bronchovascular margin  Treatment Effect: No known presurgical therapy  Regional Lymph Nodes:       Number of Lymph Nodes Involved: 0                            Nodal Sites with Tumor: Not applicable       Number of Lymph Nodes Examined: 12      Primary cancer of right upper lobe of lung ([place])  11/03/2020 Initial Diagnosis   Primary cancer of right upper lobe of lung ([place])   11/03/2020 Cancer Staging   Staging form: Lung, AJCC 8th Edition - Pathologic: Stage IA3 (pT1c, pN0, cM0) - Signed by Cammie Sickle, MD on 11/04/2020   09/14/2021 - 04/30/2022 Chemotherapy   Patient is on Treatment Plan : LUNG SCLC Carboplatin + Etoposide + Atezolizumab Induction q21d / Atezolizumab Maintenance q21d     09/15/2021 - 06/18/2022 Chemotherapy   Patient is on Treatment Plan : LUNG SCLC Carboplatin + Etoposide + Atezolizumab Induction q21d x 4 cycles / Atezolizumab Maintenance q21d     11/09/2021 Cancer Staging   Staging form: Lung, AJCC 8th Edition - Pathologic: Stage IVB (pTX, pNX, cM1c) - Signed by Cammie Sickle, MD on 11/09/2021   06/22/2022 Pathology Results   Liver biopsy showed metastatic cancer with neuroendocrine features    08/09/2022 -  Chemotherapy   Patient is on Treatment Plan : BRAIN Carboplatin (AUC 5) D1 + Etoposide (120) D1-3 q28d       ALLERGIES:  has No Known Allergies.  MEDICATIONS:  Current Outpatient Medications  Medication Sig Dispense Refill   albuterol (PROVENTIL HFA) 108 (90 Base) MCG/ACT inhaler Inhale 2 puffs into the lungs once every 6 (six) hours as needed for wheezing or shortness of breath. 18 g 2   DULoxetine (CYMBALTA) 30 MG capsule Take 1 capsule (30 mg total) by mouth daily. (Patient not taking: Reported on 09/06/2022) 30 capsule 3   lidocaine (LIDODERM) 5 % Place 1 patch onto the skin daily. Remove & Discard patch within 12 hours or as directed by  MD (Patient not taking: Reported on 06/29/2022) 30 patch 2   lidocaine-prilocaine (EMLA) cream Apply one application topically the the affected area(s) daily as needed. 30 g 3   loratadine (CLARITIN) 10 MG tablet Take 1 tablet (10 mg total) by mouth daily. (Patient not taking: Reported on 06/18/2022) 30 tablet 2   metFORMIN (GLUCOPHAGE) 500 MG tablet Take 500 mg by mouth daily with breakfast.     naloxone (NARCAN) nasal spray 4 mg/0.1 mL SPRAY 1 SPRAY INTO ONE NOSTRIL AS DIRECTED FOR OPIOID OVERDOSE (TURN PERSON ON SIDE AFTER DOSE. IF NO RESPONSE IN 2-3 MINUTES OR PERSON RESPONDS BUT RELAPSES, REPEAT USING A NEW SPRAY DEVICE AND SPRAY INTO THE OTHER NOSTRIL. CALL 911 AFTER USE.) * EMERGENCY USE ONLY * 2 each 0   ondansetron (ZOFRAN) 4 MG tablet TAKE 2 TABLETS (8MG) BY MOUTH ONCE EVERY 8 HOURS AS NEEDED FOR NAUSEA OR VOMITING. (Patient not taking: Reported on 06/29/2022) 80 tablet 1   oxyCODONE (OXYCONTIN) 20 mg 12 hr tablet Take 1 tablet (20 mg total) by mouth every 12 (  twelve) hours. 60 tablet 0   oxyCODONE-acetaminophen (PERCOCET) 10-325 MG tablet Take 1-2 tablets by mouth every 4 (four) hours as needed for pain. 60 tablet 0   predniSONE (DELTASONE) 20 MG tablet Take 1 tablet (20 mg total) by mouth daily with breakfast. Once a day with food. (Patient not taking: Reported on 09/06/2022) 30 tablet 0   prochlorperazine (COMPAZINE) 10 MG tablet Take 1 tablet (10 mg total) by mouth once every 6 (six) hours as needed for nausea or vomiting. (Patient not taking: Reported on 07/09/2022) 40 tablet 1   No current facility-administered medications for this visit.   Facility-Administered Medications Ordered in Other Visits  Medication Dose Route Frequency Provider Last Rate Last Admin   dexamethasone (DECADRON) 10 mg in sodium chloride 0.9 % 50 mL IVPB  10 mg Intravenous Once Cammie Sickle, MD 204 mL/hr at 09/07/22 1321 10 mg at 09/07/22 1321   etoposide (VEPESID) 210 mg in sodium chloride 0.9 % 1,000 mL  chemo infusion  120 mg/m2 (Treatment Plan Recorded) Intravenous Once Charlaine Dalton R, MD       heparin lock flush 100 UNIT/ML injection            heparin lock flush 100 unit/mL  500 Units Intravenous Once Charlaine Dalton R, MD       heparin lock flush 100 unit/mL  500 Units Intracatheter Once PRN Cammie Sickle, MD       morphine (PF) 2 MG/ML injection            sodium chloride flush (NS) 0.9 % injection 10 mL  10 mL Intravenous PRN Charlaine Dalton R, MD   10 mL at 03/01/22 0857   sodium chloride flush (NS) 0.9 % injection 10 mL  10 mL Intracatheter PRN Cammie Sickle, MD        VITAL SIGNS: There were no vitals taken for this visit. There were no vitals filed for this visit.   Estimated body mass index is 22.96 kg/m as calculated from the following:   Height as of 09/06/22: _0  (1.702 m).   Weight as of 09/06/22: 146 lb 9.6 oz (66.5 kg).  LABS: CBC:    Component Value Date/Time   WBC 8.0 09/06/2022 1005   HGB 13.4 09/06/2022 1005   HGB 15.5 11/26/2020 1214   HCT 40.0 09/06/2022 1005   HCT 47.6 (H) 11/26/2020 1214   PLT 531 (H) 09/06/2022 1005   PLT 317 11/26/2020 1214   MCV 87.3 09/06/2022 1005   MCV 87 11/26/2020 1214   NEUTROABS 4.5 09/06/2022 1005   NEUTROABS 2.5 11/26/2020 1214   LYMPHSABS 2.3 09/06/2022 1005   LYMPHSABS 3.6 (H) 11/26/2020 1214   MONOABS 1.1 (H) 09/06/2022 1005   EOSABS 0.0 09/06/2022 1005   EOSABS 0.2 11/26/2020 1214   BASOSABS 0.1 09/06/2022 1005   BASOSABS 0.1 11/26/2020 1214   Comprehensive Metabolic Panel:    Component Value Date/Time   NA 135 09/06/2022 1005   NA 139 11/26/2020 1214   K 3.6 09/06/2022 1005   CL 105 09/06/2022 1005   CO2 24 09/06/2022 1005   BUN 9 09/06/2022 1005   BUN 8 11/26/2020 1214   CREATININE 0.45 09/06/2022 1005   GLUCOSE 102 (H) 09/06/2022 1005   CALCIUM 8.8 (L) 09/06/2022 1005   AST 13 (L) 09/06/2022 1005   ALT 8 09/06/2022 1005   ALKPHOS 83 09/06/2022 1005   BILITOT 0.4  09/06/2022 1005   BILITOT 0.3 11/26/2020 1214   PROT 7.0 09/06/2022  1005   PROT 6.9 11/26/2020 1214   ALBUMIN 3.5 09/06/2022 1005   ALBUMIN 4.0 11/26/2020 1214    RADIOGRAPHIC STUDIES: No results found.  PERFORMANCE STATUS (ECOG) : 1 - Symptomatic but completely ambulatory  Review of Systems Unless otherwise noted, a complete review of systems is negative.  Physical Exam General: NAD Pulmonary: Unlabored Extremities: no edema, no joint deformities Skin: no rashes Neurological: Weakness but otherwise nonfocal  IMPRESSION: Patient has had severe right scapular/shoulder pain secondary to known lytic lesion in that area.  She is s/p repeat XRT to that area without significant improvement in pain.  Patient was scheduled for telephone visit today but was seen in infusion instead.  Patient reports that she has had significant improvement in pain after starting OxyContin.  She is taking 1 Percocet in between OxyContin dosing.  Otherwise, she reports that her pain is stable and tolerable at this point.  As such, she has decided to hold on pursuing RFA.  She reports that she is tolerating pain medications well.  She denies any changes or concerns.  No new symptomatic complaints.  PLAN: -Continue current scope of treatment -Continue OxyContin 20 mg every 12 hours -Continue Percocet 1-2 tablets every 4 hours as needed for breakthrough pain -Daily bowel regimen -Follow up telephone visit 1 to 2 months   Patient expressed understanding and was in agreement with this plan. She also understands that She can call the clinic at any time with any questions, concerns, or complaints.     Time Total: 15 minutes  Visit consisted of counseling and education dealing with the complex and emotionally intense issues of symptom management and palliative care in the setting of serious and potentially life-threatening illness.Greater than 50%  of this time was spent counseling and coordinating care  related to the above assessment and plan.  Signed by: Altha Harm, PhD, NP-C

## 2022-09-08 ENCOUNTER — Inpatient Hospital Stay: Payer: Medicaid Other

## 2022-09-08 VITALS — BP 124/71 | HR 50 | Temp 98.1°F | Resp 18

## 2022-09-08 DIAGNOSIS — Z5111 Encounter for antineoplastic chemotherapy: Secondary | ICD-10-CM | POA: Diagnosis not present

## 2022-09-08 DIAGNOSIS — C3411 Malignant neoplasm of upper lobe, right bronchus or lung: Secondary | ICD-10-CM

## 2022-09-08 MED ORDER — SODIUM CHLORIDE 0.9 % IV SOLN
Freq: Once | INTRAVENOUS | Status: AC
  Filled 2022-09-08: qty 250

## 2022-09-08 MED ORDER — SODIUM CHLORIDE 0.9 % IV SOLN
10.0000 mg | Freq: Once | INTRAVENOUS | Status: AC
  Administered 2022-09-08: 10 mg via INTRAVENOUS
  Filled 2022-09-08: qty 10

## 2022-09-08 MED ORDER — HEPARIN SOD (PORK) LOCK FLUSH 100 UNIT/ML IV SOLN
500.0000 [IU] | Freq: Once | INTRAVENOUS | Status: AC | PRN
  Administered 2022-09-08: 500 [IU]
  Filled 2022-09-08: qty 5

## 2022-09-08 MED ORDER — SODIUM CHLORIDE 0.9 % IV SOLN
120.0000 mg/m2 | Freq: Once | INTRAVENOUS | Status: AC
  Administered 2022-09-08: 210 mg via INTRAVENOUS
  Filled 2022-09-08: qty 10.5

## 2022-09-10 ENCOUNTER — Inpatient Hospital Stay: Payer: Medicaid Other | Attending: Radiation Oncology

## 2022-09-10 DIAGNOSIS — Z5189 Encounter for other specified aftercare: Secondary | ICD-10-CM | POA: Insufficient documentation

## 2022-09-10 DIAGNOSIS — C3411 Malignant neoplasm of upper lobe, right bronchus or lung: Secondary | ICD-10-CM | POA: Diagnosis not present

## 2022-09-10 MED ORDER — PEGFILGRASTIM-CBQV 6 MG/0.6ML ~~LOC~~ SOSY
6.0000 mg | PREFILLED_SYRINGE | Freq: Once | SUBCUTANEOUS | Status: AC
  Administered 2022-09-10: 6 mg via SUBCUTANEOUS

## 2022-09-21 ENCOUNTER — Other Ambulatory Visit: Payer: Self-pay

## 2022-09-21 ENCOUNTER — Other Ambulatory Visit: Payer: Self-pay | Admitting: *Deleted

## 2022-09-21 MED ORDER — OXYCODONE-ACETAMINOPHEN 10-325 MG PO TABS
1.0000 | ORAL_TABLET | ORAL | 0 refills | Status: DC | PRN
  Filled 2022-09-21: qty 60, 5d supply, fill #0

## 2022-09-24 ENCOUNTER — Other Ambulatory Visit: Payer: Self-pay

## 2022-09-24 MED FILL — Fosaprepitant Dimeglumine For IV Infusion 150 MG (Base Eq): INTRAVENOUS | Qty: 5 | Status: AC

## 2022-09-24 MED FILL — Dexamethasone Sodium Phosphate Inj 100 MG/10ML: INTRAMUSCULAR | Qty: 1 | Status: AC

## 2022-09-27 ENCOUNTER — Inpatient Hospital Stay: Payer: Medicaid Other

## 2022-09-27 ENCOUNTER — Encounter: Payer: Self-pay | Admitting: Nurse Practitioner

## 2022-09-27 ENCOUNTER — Inpatient Hospital Stay (HOSPITAL_BASED_OUTPATIENT_CLINIC_OR_DEPARTMENT_OTHER): Payer: Medicaid Other | Admitting: Nurse Practitioner

## 2022-09-27 ENCOUNTER — Encounter: Payer: Self-pay | Admitting: Internal Medicine

## 2022-09-27 VITALS — BP 115/81 | HR 66 | Temp 97.6°F | Resp 16 | Wt 146.0 lb

## 2022-09-27 DIAGNOSIS — C3411 Malignant neoplasm of upper lobe, right bronchus or lung: Secondary | ICD-10-CM | POA: Diagnosis not present

## 2022-09-27 DIAGNOSIS — Z5111 Encounter for antineoplastic chemotherapy: Secondary | ICD-10-CM | POA: Diagnosis not present

## 2022-09-27 DIAGNOSIS — Z5189 Encounter for other specified aftercare: Secondary | ICD-10-CM | POA: Diagnosis not present

## 2022-09-27 DIAGNOSIS — C7951 Secondary malignant neoplasm of bone: Secondary | ICD-10-CM | POA: Diagnosis not present

## 2022-09-27 DIAGNOSIS — G893 Neoplasm related pain (acute) (chronic): Secondary | ICD-10-CM | POA: Diagnosis not present

## 2022-09-27 LAB — CBC WITH DIFFERENTIAL/PLATELET
Abs Immature Granulocytes: 0.06 10*3/uL (ref 0.00–0.07)
Basophils Absolute: 0 10*3/uL (ref 0.0–0.1)
Basophils Relative: 0 %
Eosinophils Absolute: 0 10*3/uL (ref 0.0–0.5)
Eosinophils Relative: 0 %
HCT: 36.6 % (ref 36.0–46.0)
Hemoglobin: 12.5 g/dL (ref 12.0–15.0)
Immature Granulocytes: 1 %
Lymphocytes Relative: 24 %
Lymphs Abs: 3.1 10*3/uL (ref 0.7–4.0)
MCH: 30.1 pg (ref 26.0–34.0)
MCHC: 34.2 g/dL (ref 30.0–36.0)
MCV: 88.2 fL (ref 80.0–100.0)
Monocytes Absolute: 1.1 10*3/uL — ABNORMAL HIGH (ref 0.1–1.0)
Monocytes Relative: 9 %
Neutro Abs: 8.4 10*3/uL — ABNORMAL HIGH (ref 1.7–7.7)
Neutrophils Relative %: 66 %
Platelets: 488 10*3/uL — ABNORMAL HIGH (ref 150–400)
RBC: 4.15 MIL/uL (ref 3.87–5.11)
RDW: 18.6 % — ABNORMAL HIGH (ref 11.5–15.5)
WBC: 12.7 10*3/uL — ABNORMAL HIGH (ref 4.0–10.5)
nRBC: 0 % (ref 0.0–0.2)

## 2022-09-27 LAB — COMPREHENSIVE METABOLIC PANEL
ALT: 10 U/L (ref 0–44)
AST: 15 U/L (ref 15–41)
Albumin: 3.4 g/dL — ABNORMAL LOW (ref 3.5–5.0)
Alkaline Phosphatase: 100 U/L (ref 38–126)
Anion gap: 8 (ref 5–15)
BUN: 12 mg/dL (ref 8–23)
CO2: 25 mmol/L (ref 22–32)
Calcium: 9 mg/dL (ref 8.9–10.3)
Chloride: 107 mmol/L (ref 98–111)
Creatinine, Ser: 0.5 mg/dL (ref 0.44–1.00)
GFR, Estimated: >60 mL/min (ref >60)
Glucose, Bld: 114 mg/dL — ABNORMAL HIGH (ref 70–99)
Potassium: 3.9 mmol/L (ref 3.5–5.1)
Sodium: 140 mmol/L (ref 135–145)
Total Bilirubin: 0.3 mg/dL (ref 0.3–1.2)
Total Protein: 6.9 g/dL (ref 6.5–8.1)

## 2022-09-27 MED ORDER — SODIUM CHLORIDE 0.9 % IV SOLN
Freq: Once | INTRAVENOUS | Status: AC
  Filled 2022-09-27: qty 250

## 2022-09-27 MED ORDER — ACETAMINOPHEN 325 MG PO TABS
650.0000 mg | ORAL_TABLET | Freq: Once | ORAL | Status: AC
  Administered 2022-09-27: 650 mg via ORAL
  Filled 2022-09-27: qty 2

## 2022-09-27 MED ORDER — FAMOTIDINE IN NACL 20-0.9 MG/50ML-% IV SOLN
20.0000 mg | Freq: Once | INTRAVENOUS | Status: AC
  Administered 2022-09-27: 20 mg via INTRAVENOUS
  Filled 2022-09-27: qty 50

## 2022-09-27 MED ORDER — SODIUM CHLORIDE 0.9 % IV SOLN
150.0000 mg | Freq: Once | INTRAVENOUS | Status: AC
  Administered 2022-09-27: 150 mg via INTRAVENOUS
  Filled 2022-09-27: qty 150

## 2022-09-27 MED ORDER — SODIUM CHLORIDE 0.9 % IV SOLN
518.0000 mg | Freq: Once | INTRAVENOUS | Status: AC
  Administered 2022-09-27: 520 mg via INTRAVENOUS
  Filled 2022-09-27: qty 52

## 2022-09-27 MED ORDER — PALONOSETRON HCL INJECTION 0.25 MG/5ML
0.2500 mg | Freq: Once | INTRAVENOUS | Status: AC
  Administered 2022-09-27: 0.25 mg via INTRAVENOUS
  Filled 2022-09-27: qty 5

## 2022-09-27 MED ORDER — SODIUM CHLORIDE 0.9 % IV SOLN
10.0000 mg | Freq: Once | INTRAVENOUS | Status: AC
  Administered 2022-09-27: 10 mg via INTRAVENOUS
  Filled 2022-09-27: qty 10

## 2022-09-27 MED ORDER — SODIUM CHLORIDE 0.9% FLUSH
10.0000 mL | INTRAVENOUS | Status: DC | PRN
  Administered 2022-09-27: 10 mL via INTRAVENOUS
  Filled 2022-09-27: qty 10

## 2022-09-27 MED ORDER — HEPARIN SOD (PORK) LOCK FLUSH 100 UNIT/ML IV SOLN
500.0000 [IU] | Freq: Once | INTRAVENOUS | Status: AC | PRN
  Administered 2022-09-27: 500 [IU]
  Filled 2022-09-27: qty 5

## 2022-09-27 MED ORDER — SODIUM CHLORIDE 0.9 % IV SOLN
120.0000 mg/m2 | Freq: Once | INTRAVENOUS | Status: AC
  Administered 2022-09-27: 210 mg via INTRAVENOUS
  Filled 2022-09-27: qty 10.5

## 2022-09-27 MED FILL — Dexamethasone Sodium Phosphate Inj 100 MG/10ML: INTRAMUSCULAR | Qty: 1 | Status: AC

## 2022-09-27 NOTE — Patient Instructions (Signed)
[facility]  Discharge Instructions: Thank you for choosing [facility] to provide your oncology and hematology care.  If you have a lab appointment with the Cancer Center, please go directly to the Cancer Center and check in at the registration area.  Wear comfortable clothing and clothing appropriate for easy access to any Portacath or PICC line.   We strive to give you quality time with your provider. You may need to reschedule your appointment if you arrive late (15 or more minutes).  Arriving late affects you and other patients whose appointments are after yours.  Also, if you miss three or more appointments without notifying the office, you may be dismissed from the clinic at the provider's discretion.      For prescription refill requests, have your pharmacy contact our office and allow 72 hours for refills to be completed.       To help prevent nausea and vomiting after your treatment, we encourage you to take your nausea medication as directed.  BELOW ARE SYMPTOMS THAT SHOULD BE REPORTED IMMEDIATELY: *FEVER GREATER THAN 100.4 F (38 C) OR HIGHER *CHILLS OR SWEATING *NAUSEA AND VOMITING THAT IS NOT CONTROLLED WITH YOUR NAUSEA MEDICATION *UNUSUAL SHORTNESS OF BREATH *UNUSUAL BRUISING OR BLEEDING *URINARY PROBLEMS (pain or burning when urinating, or frequent urination) *BOWEL PROBLEMS (unusual diarrhea, constipation, pain near the anus) TENDERNESS IN MOUTH AND THROAT WITH OR WITHOUT PRESENCE OF ULCERS (sore throat, sores in mouth, or a toothache) UNUSUAL RASH, SWELLING OR PAIN  UNUSUAL VAGINAL DISCHARGE OR ITCHING   Items with * indicate a potential emergency and should be followed up as soon as possible or go to the Emergency Department if any problems should occur.  Please show the CHEMOTHERAPY ALERT CARD or IMMUNOTHERAPY ALERT CARD at check-in to the Emergency Department and triage nurse.  Should you have questions after your  visit or need to cancel or reschedule your appointment, please contact [facility]  [phone] and follow the prompts.  Office hours are 8:00 a.m. to 4:30 p.m. Monday - Friday. Please note that voicemails left after 4:00 p.m. may not be returned until the following business day.  We are closed weekends and major holidays. You have access to a nurse at all times for urgent questions. Please call the main number to the clinic [phone] and follow the prompts.  For any non-urgent questions, you may also contact your provider using MyChart. We now offer e-Visits for anyone 18 and older to request care online for non-urgent symptoms. For details visit [email or web address].   Also download the MyChart app! Go to the app store, search "MyChart", open the app, select [facility], and log in with your MyChart username and password.  Masks are optional in the cancer centers. If you would like for your care team to wear a mask while they are taking care of you, please let them know. For doctor visits, patients may have with them one support person who is at least 61 years old. At this time, visitors are not allowed in the infusion area.   

## 2022-09-27 NOTE — Progress Notes (Signed)
Pt in for follow up and treatment today.  Pt denies any concerns.

## 2022-09-27 NOTE — Progress Notes (Signed)
[place] NOTE  Patient Care Team: Cammie Sickle, MD as PCP - General (Internal Medicine) Telford Nab, RN as Oncology Nurse Navigator  CHIEF COMPLAINTS/PURPOSE OF CONSULTATION: Lung Cancer  #  Oncology History Overview Note  # NOV -[id number] [facility] CANCER SCREENING PROGRAM]-18 mm right upper lobe lung nodule; DEC 2021- s/p right upper lobectomy [Dr. Roxan Hockey; [place]]; STAGE: I [pT-18 mm; LN-12=0]; predominant large cell neuroendocrine; minor adenocarcinoma.  Declines adjuvant chemotherapy.  #Recurrent/stage IV cancer NOV 2022- PET scan-scapular lesion liver lesion gastrohepatic lymphadenopathy.  11/21- start RT to right scapular lesion  # DEC 3rd, 2022- CARBO=ETOP+TECEN; udenyca #1; SEP 2023- STOP Tencetriq- progression  SEP 2023-liver biopsy shows neuroendocrine carcinoma/large cell; no adenocarcinoma noted.  Discontinue Tecentriq any progression of disease.  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   # OCTOBER, 2023-  # # MAY 2023- Right swollen eye/orbital inflammation/uveitis s/p Zometa status post steroids improved.-reviewed literature case reports noted; less concerning for tumor involvement.  DISCONTINUE ZOMETA.  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  Visceral Pleura Invasion: Not identified  Direct Invasion of Adjacent Structures: No adjacent structures present  Lymphovascular Invasion: Not identified  Margins: All margins negative for invasive carcinoma       Closest Margin(s) to Invasive Carcinoma: Bronchovascular margin  Treatment Effect: No known presurgical therapy   Regional Lymph Nodes:       Number of Lymph Nodes Involved: 0                            Nodal Sites with Tumor: Not applicable       Number of Lymph Nodes Examined: 12      Primary cancer of right upper lobe of lung ([place])  11/03/2020 Initial Diagnosis   Primary cancer of right upper lobe of lung ([name])   11/03/2020 Cancer Staging   Staging form: Lung, AJCC 8th Edition - Pathologic: Stage IA3 (pT1c, pN0, cM0) - Signed by Cammie Sickle, MD on 11/04/2020   09/14/2021 - 04/30/2022 Chemotherapy   Patient is on Treatment Plan : LUNG SCLC Carboplatin + Etoposide + Atezolizumab Induction q21d / Atezolizumab Maintenance q21d     09/15/2021 - 06/18/2022 Chemotherapy   Patient is on Treatment Plan : LUNG SCLC Carboplatin + Etoposide + Atezolizumab Induction q21d x 4 cycles / Atezolizumab Maintenance q21d     11/09/2021 Cancer Staging   Staging form: Lung, AJCC 8th Edition - Pathologic: Stage IVB (pTX, pNX, cM1c) - Signed by Cammie Sickle, MD on 11/09/2021   06/22/2022 Pathology Results   Liver biopsy showed metastatic cancer with neuroendocrine features    08/09/2022 -  Chemotherapy   Patient is on Treatment Plan : BRAIN Carboplatin (AUC 5) D1 + Etoposide (120) D1-3 q28d       HISTORY OF PRESENTING ILLNESS: Ambulating independently.  Alone.   Stacie Kennedy 61 y.o.  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 Shoulder pain well controlled. No headaches, sob. Moving bowels well. Eating and drinking normally.  Review of Systems  Constitutional:  Negative for chills, diaphoresis, fever, malaise/fatigue and weight loss.  HENT:  Negative for nosebleeds and sore throat.   Eyes:  Negative for double vision.  Respiratory:  Negative for cough, hemoptysis, sputum production, shortness of breath and wheezing.   Cardiovascular:  Negative for chest pain,  palpitations, orthopnea and leg swelling.  Gastrointestinal:  Negative for abdominal pain, blood in stool, constipation, diarrhea, heartburn, melena, nausea and vomiting.  Genitourinary:  Negative for dysuria, frequency and urgency.  Musculoskeletal:  Negative for back pain, joint pain and myalgias.  Skin:  Negative for itching and rash.  Neurological:  Negative for dizziness, tingling, focal weakness, weakness and headaches.  Endo/Heme/Allergies:  Does not bruise/bleed easily.  Psychiatric/Behavioral:  Negative for depression. The patient is not nervous/anxious and does not have insomnia.      MEDICAL HISTORY:  Past Medical History:  Diagnosis Date   Cancer [facility])    lung cancer   Depression    Duodenitis    Dyspnea    Family history of cancer    High cholesterol    Personal history of colonic polyps    Pre-diabetes    Umbilical hernia    UTI (urinary tract infection)     SURGICAL HISTORY: Past Surgical History:  Procedure Laterality Date   COLONOSCOPY WITH PROPOFOL N/A 03/24/2021   Procedure: COLONOSCOPY WITH PROPOFOL;  Surgeon: Jonathon Bellows, MD;  [facility] ENDOSCOPY;  Service: Gastroenterology;  Laterality: N/A;   INTERCOSTAL NERVE BLOCK  09/19/2020   Procedure: INTERCOSTAL NERVE BLOCK;  Surgeon: Melrose Nakayama, MD;  Location: [place];  Service: Thoracic;;   IR IMAGING GUIDED PORT INSERTION  09/23/2021   LUNG LOBECTOMY Right    LUNG REMOVAL, PARTIAL Right 09/19/2020   NODE DISSECTION Right 09/19/2020   Procedure: NODE DISSECTION;  Surgeon: Melrose Nakayama, MD;  Location: [place];  Service: Thoracic;  Laterality: Right;   VIDEO BRONCHOSCOPY N/A 07/08/2021   Procedure: VIDEO BRONCHOSCOPY;  Surgeon: Melrose Nakayama, MD;  Location: [facility] OR;  Service: Thoracic;  Laterality: N/A;    SOCIAL HISTORY: Social History   Socioeconomic History   Marital status: Single    Spouse name: Not on file   Number of children: Not on file   Years of education: Not on file    Highest education level: Not on file  Occupational History   Not on file  Tobacco Use   Smoking status: Some Days    Packs/day: 0.25    Years: 43.00    Total pack years: 10.75    Types: Cigarettes   Smokeless tobacco: Never   Tobacco comments:    states she is slowing, trying to quit  Vaping Use   Vaping Use: Never used  Substance and Sexual Activity   Alcohol use: Yes    Alcohol/week: 2.0 standard drinks of alcohol    Types: 2 Cans of beer per week    Comment: occasional   Drug use: No   Sexual activity: Not on file  Other Topics Concern   Not on file  Social History Narrative   Lives in [place]; smokes; now and then beer; with boy friend. 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    Social Determinants of Health   Financial Resource Strain: High Risk (04/22/2022)   Overall Financial Resource Strain (CARDIA)    Difficulty of Paying Living Expenses: Hard  Food Insecurity: Food Insecurity Present (07/09/2021)   Hunger Vital Sign    Worried About Charity fundraiser in  the Last Year: Sometimes true    Ran Out of Food in the Last Year: Sometimes true  Transportation Needs: No Transportation Needs (07/09/2021)   PRAPARE - Hydrologist (Medical): No    Lack of Transportation (Non-Medical): No  Physical Activity: Insufficiently Active (04/16/2021)   Exercise Vital Sign    Days of Exercise per Week: 7 days    Minutes of Exercise per Session: 20 min  Stress: Stress Concern Present (04/22/2022)   [place]    Feeling of Stress : Very much  Social Connections: Socially Isolated (04/22/2022)   Social Connection and Isolation Panel [NHANES]    Frequency of Communication with Friends and Family: Once a week    Frequency of Social Gatherings with Friends and Family: Once a week    Attends Religious Services: Never    Marine scientist or Organizations: No    Attends English as a second language teacher Meetings: Never    Marital Status: Living with partner  Intimate Partner Violence: At Risk (04/22/2022)   Humiliation, Afraid, Rape, and Kick questionnaire    Fear of Current or Ex-Partner: No    Emotionally Abused: Yes    Physically Abused: No    Sexually Abused: No    FAMILY HISTORY: Family History  Problem Relation Age of Onset   Diabetes Mother    Cancer Mother    Diabetes Father    Stroke Father    Heart attack Father        50s   Healthy Sister    Cancer Brother    Hepatitis Brother    Diabetes Brother    Hypertension Brother    Heart disease Brother     ALLERGIES:  has No Known Allergies.  MEDICATIONS:  Current Outpatient Medications  Medication Sig Dispense Refill   albuterol (PROVENTIL HFA) 108 (90 Base) MCG/ACT inhaler Inhale 2 puffs into the lungs once every 6 (six) hours as needed for wheezing or shortness of breath. 18 g 2   DULoxetine (CYMBALTA) 30 MG capsule Take 1 capsule (30 mg total) by mouth daily. (Patient not taking: Reported on 09/06/2022) 30 capsule 3   lidocaine (LIDODERM) 5 % Place 1 patch onto the skin daily. Remove & Discard patch within 12 hours or as directed by MD (Patient not taking: Reported on 06/29/2022) 30 patch 2   lidocaine-prilocaine (EMLA) cream Apply one application topically the the affected area(s) daily as needed. 30 g 3   loratadine (CLARITIN) 10 MG tablet Take 1 tablet (10 mg total) by mouth daily. (Patient not taking: Reported on 06/18/2022) 30 tablet 2   metFORMIN (GLUCOPHAGE) 500 MG tablet Take 500 mg by mouth daily with breakfast.     naloxone (NARCAN) nasal spray 4 mg/0.1 mL SPRAY 1 SPRAY INTO ONE NOSTRIL AS DIRECTED FOR OPIOID OVERDOSE (TURN PERSON ON SIDE AFTER DOSE. IF NO RESPONSE IN 2-3 MINUTES OR PERSON RESPONDS BUT RELAPSES, REPEAT USING A NEW SPRAY DEVICE AND SPRAY INTO THE OTHER NOSTRIL. CALL 911 AFTER USE.) * EMERGENCY USE ONLY * 2 each 0   ondansetron (ZOFRAN) 4 MG tablet TAKE 2 TABLETS (8MG) BY MOUTH ONCE  EVERY 8 HOURS AS NEEDED FOR NAUSEA OR VOMITING. (Patient not taking: Reported on 06/29/2022) 80 tablet 1   oxyCODONE (OXYCONTIN) 20 mg 12 hr tablet Take 1 tablet (20 mg total) by mouth every 12 (twelve) hours. 60 tablet 0   oxyCODONE-acetaminophen (PERCOCET) 10-325 MG tablet Take 1-2 tablets by mouth  every 4 (four) hours as needed for pain. 60 tablet 0   predniSONE (DELTASONE) 20 MG tablet Take 1 tablet (20 mg total) by mouth daily with breakfast. Once a day with food. (Patient not taking: Reported on 09/06/2022) 30 tablet 0   prochlorperazine (COMPAZINE) 10 MG tablet Take 1 tablet (10 mg total) by mouth once every 6 (six) hours as needed for nausea or vomiting. (Patient not taking: Reported on 07/09/2022) 40 tablet 1   No current facility-administered medications for this visit.   Facility-Administered Medications Ordered in Other Visits  Medication Dose Route Frequency Provider Last Rate Last Admin   heparin lock flush 100 UNIT/ML injection            heparin lock flush 100 unit/mL  500 Units Intravenous Once Charlaine Dalton R, MD       morphine (PF) 2 MG/ML injection            sodium chloride flush (NS) 0.9 % injection 10 mL  10 mL Intravenous PRN Charlaine Dalton R, MD   10 mL at 03/01/22 0857   sodium chloride flush (NS) 0.9 % injection 10 mL  10 mL Intravenous PRN Cammie Sickle, MD   10 mL at 09/27/22 0824     PHYSICAL EXAMINATION: ECOG PERFORMANCE STATUS: 0 - Asymptomatic  Vitals:   09/27/22 0901  BP: 115/81  Pulse: 66  Resp: 16  Temp: 97.6 F (36.4 C)  SpO2: 96%   Filed Weights   09/27/22 0901  Weight: 146 lb (66.2 kg)   Physical Exam HENT:     Head: Normocephalic and atraumatic.     Mouth/Throat:     Pharynx: No oropharyngeal exudate.  Eyes:     Pupils: Pupils are equal, round, and reactive to light.  Cardiovascular:     Rate and Rhythm: Normal rate and regular rhythm.  Pulmonary:     Effort: Pulmonary effort is normal. No respiratory distress.      Breath sounds: Normal breath sounds. No wheezing.  Abdominal:     General: Bowel sounds are normal. There is no distension.     Palpations: Abdomen is soft. There is no mass.     Tenderness: There is no abdominal tenderness. There is no guarding or rebound.  Musculoskeletal:        General: No tenderness. Normal range of motion.     Cervical back: Normal range of motion and neck supple.  Skin:    General: Skin is warm.  Neurological:     Mental Status: She is alert and oriented to person, place, and time.  Psychiatric:        Mood and Affect: Affect normal.     LABORATORY DATA:  I have reviewed the data as listed Lab Results  Component Value Date   WBC 12.7 (H) 09/27/2022   HGB 12.5 09/27/2022   HCT 36.6 09/27/2022   MCV 88.2 09/27/2022   PLT 488 (H) 09/27/2022   Recent Labs    07/26/22 1156 08/09/22 0932 09/06/22 1005  NA 135 137 135  K 3.8 3.5 3.6  CL 103 106 105  CO2 _0 GLUCOSE 94 113* 102*  BUN _1 CREATININE 0.42* 0.47 0.45  CALCIUM 9.1 9.0 8.8*  GFRNONAA >60 >60 >60  PROT 7.4 6.9 7.0  ALBUMIN 3.8 3.5 3.5  AST 18 15 13*  ALT _2 ALKPHOS 83 82 83  BILITOT 0.3 <0.1* 0.4     RADIOGRAPHIC STUDIES: I have personally  reviewed the radiological images as listed and agreed with the findings in the report. No results found.   ASSESSMENT & PLAN:   No problem-specific Assessment & Plan notes found for this encounter.  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 SEP 6th 2023-PET scan- Two hypermetabolic LEFT hepatic lobe metastasis. Anterior lesion is increased in size from comparison CT;  Intense hypermetabolic RIGHT scapular metastasis with soft tissue extension;  No new areas of metastatic disease. SEP 2023-liver biopsy shows neuroendocrine carcinoma/large cell; no adenocarcinoma noted.  Tecentriq  discontinued. 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 Given progression, chemotherapy started carbo-etoposide. No immunotherapy (progressed on atezo). She is now s/p cycle 2 of carbo-etoposide with udenyca support. Labs today reviewed and acceptable for treatment. Proceed with D1C3 carbo-etoposide. She will rtc for C3D2 and C3D3 of etoposide only then receive udenyca on D5.   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 Stable. Zometa discontinued.   3. Pain of metastatic malignancy- Continue percocet 10 mg q4-6h with oxycontin for long acting pain control. Stable.   4. Hypokalemia: K- 3.9. Stable.   5. Constipation- Miralax BID, fluids, dulcolax. Controlled.   6. DM- on metformin. Monitor closely on steroids  7. COPD- refilled albuterol. Stable.   8. IV access- port functional.   9. Goals of care- treatment given with palliative intent   DISPOSITION:  Chemo today- carbo-etoposide D2 & D3- etoposide D4 or D5- udenyca Rtc in 3 weeks for evaluation and consideration of cycle 4- IS- la   All questions were answered. The patient knows to call the clinic with any problems, questions or concerns.   Verlon Au, NP 09/27/2022

## 2022-09-28 ENCOUNTER — Inpatient Hospital Stay: Payer: Medicaid Other

## 2022-09-28 VITALS — BP 120/68 | HR 65 | Temp 98.1°F | Resp 16

## 2022-09-28 DIAGNOSIS — Z5189 Encounter for other specified aftercare: Secondary | ICD-10-CM | POA: Diagnosis not present

## 2022-09-28 DIAGNOSIS — C3411 Malignant neoplasm of upper lobe, right bronchus or lung: Secondary | ICD-10-CM

## 2022-09-28 MED ORDER — SODIUM CHLORIDE 0.9 % IV SOLN
120.0000 mg/m2 | Freq: Once | INTRAVENOUS | Status: AC
  Administered 2022-09-28: 210 mg via INTRAVENOUS
  Filled 2022-09-28: qty 10.5

## 2022-09-28 MED ORDER — HEPARIN SOD (PORK) LOCK FLUSH 100 UNIT/ML IV SOLN
500.0000 [IU] | Freq: Once | INTRAVENOUS | Status: DC | PRN
  Filled 2022-09-28: qty 5

## 2022-09-28 MED ORDER — SODIUM CHLORIDE 0.9 % IV SOLN
Freq: Once | INTRAVENOUS | Status: AC
  Filled 2022-09-28: qty 250

## 2022-09-28 MED ORDER — SODIUM CHLORIDE 0.9 % IV SOLN
10.0000 mg | Freq: Once | INTRAVENOUS | Status: AC
  Administered 2022-09-28: 10 mg via INTRAVENOUS
  Filled 2022-09-28: qty 10

## 2022-09-28 MED FILL — Dexamethasone Sodium Phosphate Inj 100 MG/10ML: INTRAMUSCULAR | Qty: 1 | Status: AC

## 2022-09-28 NOTE — Patient Instructions (Signed)
[facility] CANCER CTR AT [place]  Discharge Instructions: Thank you for choosing [facility] to provide your oncology and hematology care.  If you have a lab appointment with the [place], please go directly to the [place] and check in at the registration area.  Wear comfortable clothing and clothing appropriate for easy access to any Portacath or PICC line.   We strive to give you quality time with your provider. You may need to reschedule your appointment if you arrive late (15 or more minutes).  Arriving late affects you and other patients whose appointments are after yours.  Also, if you miss three or more appointments without notifying the office, you may be dismissed from the clinic at the provider's discretion.      For prescription refill requests, have your pharmacy contact our office and allow 72 hours for refills to be completed.    Today you received the following chemotherapy and/or immunotherapy agents Etoposide      To help prevent nausea and vomiting after your treatment, we encourage you to take your nausea medication as directed.  BELOW ARE SYMPTOMS THAT SHOULD BE REPORTED IMMEDIATELY: *FEVER GREATER THAN 100.4 F (38 C) OR HIGHER *CHILLS OR SWEATING *NAUSEA AND VOMITING THAT IS NOT CONTROLLED WITH YOUR NAUSEA MEDICATION *UNUSUAL SHORTNESS OF BREATH *UNUSUAL BRUISING OR BLEEDING *URINARY PROBLEMS (pain or burning when urinating, or frequent urination) *BOWEL PROBLEMS (unusual diarrhea, constipation, pain near the anus) TENDERNESS IN MOUTH AND THROAT WITH OR WITHOUT PRESENCE OF ULCERS (sore throat, sores in mouth, or a toothache) UNUSUAL RASH, SWELLING OR PAIN  UNUSUAL VAGINAL DISCHARGE OR ITCHING   Items with * indicate a potential emergency and should be followed up as soon as possible or go to the Emergency Department if any problems should occur.  Please show the CHEMOTHERAPY ALERT CARD or IMMUNOTHERAPY ALERT CARD at check-in to  the Emergency Department and triage nurse.  Should you have questions after your visit or need to cancel or reschedule your appointment, please contact [facility] CANCER [place] AT [place]  [phone] and follow the prompts.  Office hours are 8:00 a.m. to 4:30 p.m. Monday - Friday. Please note that voicemails left after 4:00 p.m. may not be returned until the following business day.  We are closed weekends and major holidays. You have access to a nurse at all times for urgent questions. Please call the main number to the clinic [phone] and follow the prompts.  For any non-urgent questions, you may also contact your provider using MyChart. We now offer e-Visits for anyone 24 and older to request care online for non-urgent symptoms. For details visit [email or web address].   Also download the MyChart app! Go to the app store, search "MyChart", open the app, select [place], and log in with your MyChart username and password.  Masks are optional in the cancer centers. If you would like for your care team to wear a mask while they are taking care of you, please let them know. For doctor visits, patients may have with them one support person who is at least 61 years old. At this time, visitors are not allowed in the infusion area.

## 2022-09-29 ENCOUNTER — Inpatient Hospital Stay: Payer: Medicaid Other

## 2022-09-29 VITALS — BP 118/70 | HR 55 | Temp 97.4°F | Resp 16

## 2022-09-29 DIAGNOSIS — Z5189 Encounter for other specified aftercare: Secondary | ICD-10-CM | POA: Diagnosis not present

## 2022-09-29 DIAGNOSIS — C3411 Malignant neoplasm of upper lobe, right bronchus or lung: Secondary | ICD-10-CM

## 2022-09-29 MED ORDER — SODIUM CHLORIDE 0.9% FLUSH
10.0000 mL | INTRAVENOUS | Status: DC | PRN
  Administered 2022-09-29: 10 mL
  Filled 2022-09-29: qty 10

## 2022-09-29 MED ORDER — HEPARIN SOD (PORK) LOCK FLUSH 100 UNIT/ML IV SOLN
500.0000 [IU] | Freq: Once | INTRAVENOUS | Status: AC | PRN
  Administered 2022-09-29: 500 [IU]
  Filled 2022-09-29: qty 5

## 2022-09-29 MED ORDER — SODIUM CHLORIDE 0.9 % IV SOLN
120.0000 mg/m2 | Freq: Once | INTRAVENOUS | Status: AC
  Administered 2022-09-29: 210 mg via INTRAVENOUS
  Filled 2022-09-29: qty 10.5

## 2022-09-29 MED ORDER — SODIUM CHLORIDE 0.9 % IV SOLN
Freq: Once | INTRAVENOUS | Status: AC
  Filled 2022-09-29: qty 250

## 2022-09-29 MED ORDER — SODIUM CHLORIDE 0.9 % IV SOLN
10.0000 mg | Freq: Once | INTRAVENOUS | Status: AC
  Administered 2022-09-29: 10 mg via INTRAVENOUS
  Filled 2022-09-29: qty 10

## 2022-09-30 ENCOUNTER — Inpatient Hospital Stay: Payer: Medicaid Other

## 2022-10-01 ENCOUNTER — Inpatient Hospital Stay: Payer: Medicaid Other

## 2022-10-01 DIAGNOSIS — C3411 Malignant neoplasm of upper lobe, right bronchus or lung: Secondary | ICD-10-CM

## 2022-10-01 DIAGNOSIS — Z5189 Encounter for other specified aftercare: Secondary | ICD-10-CM | POA: Diagnosis not present

## 2022-10-01 MED ORDER — PEGFILGRASTIM-CBQV 6 MG/0.6ML ~~LOC~~ SOSY
6.0000 mg | PREFILLED_SYRINGE | Freq: Once | SUBCUTANEOUS | Status: AC
  Administered 2022-10-01: 6 mg via SUBCUTANEOUS
  Filled 2022-10-01: qty 0.6

## 2022-10-05 ENCOUNTER — Other Ambulatory Visit: Payer: Self-pay | Admitting: *Deleted

## 2022-10-05 ENCOUNTER — Other Ambulatory Visit: Payer: Self-pay

## 2022-10-05 MED ORDER — OXYCODONE-ACETAMINOPHEN 10-325 MG PO TABS
1.0000 | ORAL_TABLET | ORAL | 0 refills | Status: DC | PRN
  Filled 2022-10-05: qty 60, 5d supply, fill #0

## 2022-10-05 MED ORDER — OXYCODONE HCL ER 20 MG PO T12A
20.0000 mg | EXTENDED_RELEASE_TABLET | Freq: Two times a day (BID) | ORAL | 0 refills | Status: DC
  Filled 2022-10-05: qty 60, 30d supply, fill #0

## 2022-10-08 ENCOUNTER — Other Ambulatory Visit (HOSPITAL_BASED_OUTPATIENT_CLINIC_OR_DEPARTMENT_OTHER): Payer: Self-pay

## 2022-10-15 MED FILL — Dexamethasone Sodium Phosphate Inj 100 MG/10ML: INTRAMUSCULAR | Qty: 1 | Status: AC

## 2022-10-15 MED FILL — Fosaprepitant Dimeglumine For IV Infusion 150 MG (Base Eq): INTRAVENOUS | Qty: 5 | Status: AC

## 2022-10-18 ENCOUNTER — Encounter: Payer: Self-pay | Admitting: Internal Medicine

## 2022-10-18 ENCOUNTER — Inpatient Hospital Stay: Payer: Medicaid Other

## 2022-10-18 ENCOUNTER — Inpatient Hospital Stay: Payer: Medicaid Other | Attending: Radiation Oncology | Admitting: Internal Medicine

## 2022-10-18 VITALS — BP 109/71 | HR 72 | Temp 98.6°F | Resp 19 | Wt 146.2 lb

## 2022-10-18 DIAGNOSIS — Z79899 Other long term (current) drug therapy: Secondary | ICD-10-CM | POA: Insufficient documentation

## 2022-10-18 DIAGNOSIS — Z7952 Long term (current) use of systemic steroids: Secondary | ICD-10-CM | POA: Diagnosis not present

## 2022-10-18 DIAGNOSIS — F17210 Nicotine dependence, cigarettes, uncomplicated: Secondary | ICD-10-CM | POA: Diagnosis not present

## 2022-10-18 DIAGNOSIS — C3411 Malignant neoplasm of upper lobe, right bronchus or lung: Secondary | ICD-10-CM | POA: Diagnosis not present

## 2022-10-18 DIAGNOSIS — Z5111 Encounter for antineoplastic chemotherapy: Secondary | ICD-10-CM | POA: Insufficient documentation

## 2022-10-18 DIAGNOSIS — E876 Hypokalemia: Secondary | ICD-10-CM | POA: Diagnosis not present

## 2022-10-18 DIAGNOSIS — Z7984 Long term (current) use of oral hypoglycemic drugs: Secondary | ICD-10-CM | POA: Diagnosis not present

## 2022-10-18 DIAGNOSIS — Z902 Acquired absence of lung [part of]: Secondary | ICD-10-CM | POA: Diagnosis not present

## 2022-10-18 DIAGNOSIS — Z5189 Encounter for other specified aftercare: Secondary | ICD-10-CM | POA: Diagnosis not present

## 2022-10-18 DIAGNOSIS — E119 Type 2 diabetes mellitus without complications: Secondary | ICD-10-CM | POA: Insufficient documentation

## 2022-10-18 DIAGNOSIS — C787 Secondary malignant neoplasm of liver and intrahepatic bile duct: Secondary | ICD-10-CM | POA: Diagnosis present

## 2022-10-18 DIAGNOSIS — E7800 Pure hypercholesterolemia, unspecified: Secondary | ICD-10-CM | POA: Insufficient documentation

## 2022-10-18 DIAGNOSIS — C7951 Secondary malignant neoplasm of bone: Secondary | ICD-10-CM | POA: Insufficient documentation

## 2022-10-18 DIAGNOSIS — J449 Chronic obstructive pulmonary disease, unspecified: Secondary | ICD-10-CM | POA: Diagnosis not present

## 2022-10-18 LAB — CBC WITH DIFFERENTIAL/PLATELET
Abs Immature Granulocytes: 0.07 10*3/uL (ref 0.00–0.07)
Basophils Absolute: 0 10*3/uL (ref 0.0–0.1)
Basophils Relative: 0 %
Eosinophils Absolute: 0 10*3/uL (ref 0.0–0.5)
Eosinophils Relative: 0 %
HCT: 35.3 % — ABNORMAL LOW (ref 36.0–46.0)
Hemoglobin: 12 g/dL (ref 12.0–15.0)
Immature Granulocytes: 1 %
Lymphocytes Relative: 20 %
Lymphs Abs: 2.8 10*3/uL (ref 0.7–4.0)
MCH: 30.6 pg (ref 26.0–34.0)
MCHC: 34 g/dL (ref 30.0–36.0)
MCV: 90.1 fL (ref 80.0–100.0)
Monocytes Absolute: 1.1 10*3/uL — ABNORMAL HIGH (ref 0.1–1.0)
Monocytes Relative: 8 %
Neutro Abs: 10.3 10*3/uL — ABNORMAL HIGH (ref 1.7–7.7)
Neutrophils Relative %: 71 %
Platelets: 380 10*3/uL (ref 150–400)
RBC: 3.92 MIL/uL (ref 3.87–5.11)
RDW: 20.8 % — ABNORMAL HIGH (ref 11.5–15.5)
WBC: 14.3 10*3/uL — ABNORMAL HIGH (ref 4.0–10.5)
nRBC: 0 % (ref 0.0–0.2)

## 2022-10-18 LAB — COMPREHENSIVE METABOLIC PANEL
ALT: 10 U/L (ref 0–44)
AST: 20 U/L (ref 15–41)
Albumin: 3.8 g/dL (ref 3.5–5.0)
Alkaline Phosphatase: 101 U/L (ref 38–126)
Anion gap: 4 — ABNORMAL LOW (ref 5–15)
BUN: 11 mg/dL (ref 8–23)
CO2: 26 mmol/L (ref 22–32)
Calcium: 8.7 mg/dL — ABNORMAL LOW (ref 8.9–10.3)
Chloride: 106 mmol/L (ref 98–111)
Creatinine, Ser: 0.54 mg/dL (ref 0.44–1.00)
GFR, Estimated: >60 mL/min (ref >60)
Glucose, Bld: 97 mg/dL (ref 70–99)
Potassium: 3.6 mmol/L (ref 3.5–5.1)
Sodium: 136 mmol/L (ref 135–145)
Total Bilirubin: 0.3 mg/dL (ref 0.3–1.2)
Total Protein: 6.8 g/dL (ref 6.5–8.1)

## 2022-10-18 MED ORDER — SODIUM CHLORIDE 0.9 % IV SOLN
150.0000 mg | Freq: Once | INTRAVENOUS | Status: AC
  Administered 2022-10-18: 150 mg via INTRAVENOUS
  Filled 2022-10-18: qty 150

## 2022-10-18 MED ORDER — SODIUM CHLORIDE 0.9 % IV SOLN
10.0000 mg | Freq: Once | INTRAVENOUS | Status: AC
  Administered 2022-10-18: 10 mg via INTRAVENOUS
  Filled 2022-10-18: qty 10

## 2022-10-18 MED ORDER — HEPARIN SOD (PORK) LOCK FLUSH 100 UNIT/ML IV SOLN
INTRAVENOUS | Status: AC
  Administered 2022-10-18: 500 [IU]
  Filled 2022-10-18: qty 5

## 2022-10-18 MED ORDER — SODIUM CHLORIDE 0.9 % IV SOLN
120.0000 mg/m2 | Freq: Once | INTRAVENOUS | Status: AC
  Administered 2022-10-18: 210 mg via INTRAVENOUS
  Filled 2022-10-18: qty 10.5

## 2022-10-18 MED ORDER — SODIUM CHLORIDE 0.9 % IV SOLN
Freq: Once | INTRAVENOUS | Status: AC
  Filled 2022-10-18: qty 250

## 2022-10-18 MED ORDER — FAMOTIDINE IN NACL 20-0.9 MG/50ML-% IV SOLN
20.0000 mg | Freq: Once | INTRAVENOUS | Status: AC
  Administered 2022-10-18: 20 mg via INTRAVENOUS
  Filled 2022-10-18: qty 50

## 2022-10-18 MED ORDER — ACETAMINOPHEN 325 MG PO TABS
650.0000 mg | ORAL_TABLET | Freq: Once | ORAL | Status: AC
  Administered 2022-10-18: 650 mg via ORAL
  Filled 2022-10-18: qty 2

## 2022-10-18 MED ORDER — HEPARIN SOD (PORK) LOCK FLUSH 100 UNIT/ML IV SOLN
500.0000 [IU] | Freq: Once | INTRAVENOUS | Status: AC | PRN
  Filled 2022-10-18: qty 5

## 2022-10-18 MED ORDER — SODIUM CHLORIDE 0.9 % IV SOLN
520.0000 mg | Freq: Once | INTRAVENOUS | Status: AC
  Administered 2022-10-18: 500 mg via INTRAVENOUS
  Filled 2022-10-18: qty 50

## 2022-10-18 MED ORDER — PALONOSETRON HCL INJECTION 0.25 MG/5ML
0.2500 mg | Freq: Once | INTRAVENOUS | Status: AC
  Administered 2022-10-18: 0.25 mg via INTRAVENOUS
  Filled 2022-10-18: qty 5

## 2022-10-18 MED FILL — Dexamethasone Sodium Phosphate Inj 100 MG/10ML: INTRAMUSCULAR | Qty: 1 | Status: AC

## 2022-10-18 NOTE — Patient Instructions (Signed)
[facility] CANCER CTR AT [place]  Discharge Instructions: Thank you for choosing [facility] to provide your oncology and hematology care.  If you have a lab appointment with the [place], please go directly to the [place] and check in at the registration area.  Wear comfortable clothing and clothing appropriate for easy access to any Portacath or PICC line.   We strive to give you quality time with your provider. You may need to reschedule your appointment if you arrive late (15 or more minutes).  Arriving late affects you and other patients whose appointments are after yours.  Also, if you miss three or more appointments without notifying the office, you may be dismissed from the clinic at the provider's discretion.      For prescription refill requests, have your pharmacy contact our office and allow 72 hours for refills to be completed.    Today you received the following chemotherapy and/or immunotherapy agents Carboplatin and Etoposide      To help prevent nausea and vomiting after your treatment, we encourage you to take your nausea medication as directed.  BELOW ARE SYMPTOMS THAT SHOULD BE REPORTED IMMEDIATELY: *FEVER GREATER THAN 100.4 F (38 C) OR HIGHER *CHILLS OR SWEATING *NAUSEA AND VOMITING THAT IS NOT CONTROLLED WITH YOUR NAUSEA MEDICATION *UNUSUAL SHORTNESS OF BREATH *UNUSUAL BRUISING OR BLEEDING *URINARY PROBLEMS (pain or burning when urinating, or frequent urination) *BOWEL PROBLEMS (unusual diarrhea, constipation, pain near the anus) TENDERNESS IN MOUTH AND THROAT WITH OR WITHOUT PRESENCE OF ULCERS (sore throat, sores in mouth, or a toothache) UNUSUAL RASH, SWELLING OR PAIN  UNUSUAL VAGINAL DISCHARGE OR ITCHING   Items with * indicate a potential emergency and should be followed up as soon as possible or go to the Emergency Department if any problems should occur.  Please show the CHEMOTHERAPY ALERT CARD or IMMUNOTHERAPY ALERT CARD  at check-in to the Emergency Department and triage nurse.  Should you have questions after your visit or need to cancel or reschedule your appointment, please contact [facility] CANCER [place] AT [place]  [phone] and follow the prompts.  Office hours are 8:00 a.m. to 4:30 p.m. Monday - Friday. Please note that voicemails left after 4:00 p.m. may not be returned until the following business day.  We are closed weekends and major holidays. You have access to a nurse at all times for urgent questions. Please call the main number to the clinic [phone] and follow the prompts.  For any non-urgent questions, you may also contact your provider using MyChart. We now offer e-Visits for anyone 88 and older to request care online for non-urgent symptoms. For details visit [email or web address].   Also download the MyChart app! Go to the app store, search "MyChart", open the app, select [place], and log in with your MyChart username and password.

## 2022-10-18 NOTE — Progress Notes (Signed)
Patient has no concerns today. 

## 2022-10-18 NOTE — Progress Notes (Signed)
[facility] CONSULT [facility]  Patient Care Team: Earna Coder, MD as PCP - General (Internal Medicine) Glory Buff, RN as Oncology Nurse Navigator  CHIEF COMPLAINTS/PURPOSE OF CONSULTATION: lung cancer  #  Oncology History Overview Note  # NOV -[id number] [facility] CANCER SCREENING PROGRAM]-18 mm right upper lobe lung nodule; DEC 2021- s/p right upper lobectomy [Dr. Dorris Fetch; [place]]; STAGE: I [pT-18 mm; LN-12=0]; predominant large cell neuroendocrine; minor adenocarcinoma.  Declines adjuvant chemotherapy.  #Recurrent/stage IV cancer NOV 2022- PET scan-scapular lesion liver lesion gastrohepatic lymphadenopathy.  11/21- start RT to right scapular lesion  # DEC 3rd, 2022- CARBO=ETOP+TECEN; udenyca #1; SEP 2023- STOP Tencetriq- progression  SEP 2023-liver biopsy shows neuroendocrine carcinoma/large cell; no adenocarcinoma noted.  Discontinue Tecentriq any progression of disease.  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   # OCTOBER, 2023-  # # MAY 2023- Right swollen eye/orbital inflammation/uveitis s/p Zometa status post steroids improved.-reviewed literature case reports noted; less concerning for tumor involvement.  DISCONTINUE ZOMETA.  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  Visceral Pleura Invasion: Not identified  Direct Invasion of Adjacent Structures: No adjacent structures present  Lymphovascular Invasion: Not identified  Margins: All margins negative for invasive carcinoma       Closest Margin(s) to Invasive Carcinoma: Bronchovascular margin  Treatment Effect: No known presurgical therapy   Regional Lymph Nodes:       Number of Lymph Nodes Involved: 0                            Nodal Sites with Tumor: Not applicable       Number of Lymph Nodes Examined: 12      Primary cancer of right upper lobe of lung (HCC)  11/03/2020 Initial Diagnosis   Primary cancer of right upper lobe of lung (HCC)   11/03/2020 Cancer Staging   Staging form: Lung, AJCC 8th Edition - Pathologic: Stage IA3 (pT1c, pN0, cM0) - Signed by Earna Coder, MD on 11/04/2020   09/14/2021 - 04/30/2022 Chemotherapy   Patient is on Treatment Plan : LUNG SCLC Carboplatin + Etoposide + Atezolizumab Induction q21d / Atezolizumab Maintenance q21d     09/15/2021 - 06/18/2022 Chemotherapy   Patient is on Treatment Plan : LUNG SCLC Carboplatin + Etoposide + Atezolizumab Induction q21d x 4 cycles / Atezolizumab Maintenance q21d     11/09/2021 Cancer Staging   Staging form: Lung, AJCC 8th Edition - Pathologic: Stage IVB (pTX, pNX, cM1c) - Signed by Earna Coder, MD on 11/09/2021   06/22/2022 Pathology Results   Liver biopsy showed metastatic cancer with neuroendocrine features    08/09/2022 -  Chemotherapy   Patient is on Treatment Plan : BRAIN Carboplatin (AUC 5) D1 + Etoposide (120) D1-3 q28d       HISTORY OF PRESENTING ILLNESS: Ambulating independently.  Alone.   Stacie Kennedy 62 y.o.  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  Shoulder pain better- currently on oxycontin BID; and percocets up to 3-4 day  Patient has no concerns today.    Mild nausea after chemotherapy. No vomiting.  No headaches. Chronic shortness of breath-not any worse.  Chronic intermittent constipation not any worse.  Using laxatives.  Review of Systems  Constitutional:  Negative for chills, diaphoresis, fever, malaise/fatigue and weight loss.  HENT:  Negative for nosebleeds and sore  throat.   Eyes:  Negative for double vision.  Respiratory:  Negative for cough, hemoptysis, sputum production, shortness of breath and wheezing.   Cardiovascular:  Negative for chest pain, palpitations, orthopnea and leg swelling.  Gastrointestinal:  Negative for abdominal pain, blood in stool, constipation, diarrhea, heartburn, melena, nausea and vomiting.  Genitourinary:  Negative for dysuria, frequency and urgency.  Musculoskeletal:  Positive for back pain, joint pain and myalgias.  Skin: Negative.  Negative for itching and rash.  Neurological:  Negative for dizziness, tingling, focal weakness, weakness and headaches.  Endo/Heme/Allergies:  Does not bruise/bleed easily.  Psychiatric/Behavioral:  Negative for depression. The patient is not nervous/anxious and does not have insomnia.      MEDICAL HISTORY:  Past Medical History:  Diagnosis Date   Cancer [facility])    lung cancer   Depression    Duodenitis    Dyspnea    Family history of cancer    High cholesterol    Personal history of colonic polyps    Pre-diabetes    Umbilical hernia    UTI (urinary tract infection)     SURGICAL HISTORY: Past Surgical History:  Procedure Laterality Date   COLONOSCOPY WITH PROPOFOL N/A 03/24/2021   Procedure: COLONOSCOPY WITH PROPOFOL;  Surgeon: Wyline Mood, MD;  Location: [facility] ENDOSCOPY;  Service: Gastroenterology;  Laterality: N/A;   INTERCOSTAL NERVE BLOCK  09/19/2020   Procedure: INTERCOSTAL NERVE BLOCK;  Surgeon: Loreli Slot, MD;  [facility] OR;  Service: Thoracic;;   IR IMAGING GUIDED PORT INSERTION  09/23/2021   LUNG LOBECTOMY Right    LUNG REMOVAL, PARTIAL Right 09/19/2020   NODE DISSECTION Right 09/19/2020   Procedure: NODE DISSECTION;  Surgeon: Loreli Slot, MD;  Location: [facility] OR;  Service: Thoracic;  Laterality: Right;   VIDEO BRONCHOSCOPY N/A 07/08/2021   Procedure: VIDEO BRONCHOSCOPY;  Surgeon: Loreli Slot, MD;  Location: [facility] OR;  Service: Thoracic;   Laterality: N/A;    SOCIAL HISTORY: Social History   Socioeconomic History   Marital status: Single    Spouse name: Not on file   Number of children: Not on file   Years of education: Not on file   Highest education level: Not on file  Occupational History   Not on file  Tobacco Use   Smoking status: Some Days    Packs/day: 0.25    Years: 43.00    Total pack years: 10.75    Types: Cigarettes   Smokeless tobacco: Never   Tobacco comments:    states she is slowing, trying to quit  Vaping Use   Vaping Use: Never used  Substance and Sexual Activity   Alcohol use: Yes    Alcohol/week: 2.0 standard drinks of alcohol    Types: 2 Cans of beer per week    Comment: occasional   Drug use: No   Sexual activity: Not on file  Other Topics Concern   Not on file  Social History Narrative   Lives in [place]; smokes; now and then beer; with boy friend. 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    Social Determinants of Health   Financial Resource Strain: High Risk (04/22/2022)   Overall  Financial Resource Strain (CARDIA)    Difficulty of Paying Living Expenses: Hard  Food Insecurity: Food Insecurity Present (07/09/2021)   Hunger Vital Sign    Worried About Running Out of Food in the Last Year: Sometimes true    Ran Out of Food in the Last Year: Sometimes true  Transportation Needs: No Transportation Needs (07/09/2021)   PRAPARE - Hydrologist (Medical): No    Lack of Transportation (Non-Medical): No  Physical Activity: Insufficiently Active (04/16/2021)   Exercise Vital Sign    Days of Exercise per Week: 7 days    Minutes of Exercise per Session: 20 min  Stress: Stress Concern Present (04/22/2022)   [place]    Feeling of Stress : Very much  Social Connections: Socially Isolated (04/22/2022)   Social Connection and Isolation Panel [NHANES]    Frequency of Communication with  Friends and Family: Once a week    Frequency of Social Gatherings with Friends and Family: Once a week    Attends Religious Services: Never    Marine scientist or Organizations: No    Attends Archivist Meetings: Never    Marital Status: Living with partner  Intimate Partner Violence: At Risk (04/22/2022)   Humiliation, Afraid, Rape, and Kick questionnaire    Fear of Current or Ex-Partner: No    Emotionally Abused: Yes    Physically Abused: No    Sexually Abused: No    FAMILY HISTORY: Family History  Problem Relation Age of Onset   Diabetes Mother    Cancer Mother    Diabetes Father    Stroke Father    Heart attack Father        44s   Healthy Sister    Cancer Brother    Hepatitis Brother    Diabetes Brother    Hypertension Brother    Heart disease Brother     ALLERGIES:  has No Known Allergies.  MEDICATIONS:  Current Outpatient Medications  Medication Sig Dispense Refill   albuterol (PROVENTIL HFA) 108 (90 Base) MCG/ACT inhaler Inhale 2 puffs into the lungs once every 6 (six) hours as needed for wheezing or shortness of breath. 18 g 2   lidocaine-prilocaine (EMLA) cream Apply one application topically the the affected area(s) daily as needed. 30 g 3   loratadine (CLARITIN) 10 MG tablet Take 1 tablet (10 mg total) by mouth daily. 30 tablet 2   metFORMIN (GLUCOPHAGE) 500 MG tablet Take 500 mg by mouth daily with breakfast.     oxyCODONE (OXYCONTIN) 20 mg 12 hr tablet Take 1 tablet (20 mg total) by mouth every 12 (twelve) hours. 60 tablet 0   oxyCODONE-acetaminophen (PERCOCET) 10-325 MG tablet Take 1-2 tablets by mouth every 4 (four) hours as needed for pain. 60 tablet 0   DULoxetine (CYMBALTA) 30 MG capsule Take 1 capsule (30 mg total) by mouth daily. (Patient not taking: Reported on 09/06/2022) 30 capsule 3   lidocaine (LIDODERM) 5 % Place 1 patch onto the skin daily. Remove & Discard patch within 12 hours or as directed by MD (Patient not taking: Reported on  06/29/2022) 30 patch 2   naloxone (NARCAN) nasal spray 4 mg/0.1 mL SPRAY 1 SPRAY INTO ONE NOSTRIL AS DIRECTED FOR OPIOID OVERDOSE (TURN PERSON ON SIDE AFTER DOSE. IF NO RESPONSE IN 2-3 MINUTES OR PERSON RESPONDS BUT RELAPSES, REPEAT USING A NEW SPRAY DEVICE AND SPRAY INTO THE OTHER NOSTRIL. CALL 911 AFTER USE.) *  EMERGENCY USE ONLY * (Patient not taking: Reported on 09/27/2022) 2 each 0   ondansetron (ZOFRAN) 4 MG tablet TAKE 2 TABLETS (8MG ) BY MOUTH ONCE EVERY 8 HOURS AS NEEDED FOR NAUSEA OR VOMITING. (Patient not taking: Reported on 06/29/2022) 80 tablet 1   predniSONE (DELTASONE) 20 MG tablet Take 1 tablet (20 mg total) by mouth daily with breakfast. Once a day with food. (Patient not taking: Reported on 09/06/2022) 30 tablet 0   prochlorperazine (COMPAZINE) 10 MG tablet Take 1 tablet (10 mg total) by mouth once every 6 (six) hours as needed for nausea or vomiting. (Patient not taking: Reported on 07/09/2022) 40 tablet 1   No current facility-administered medications for this visit.   Facility-Administered Medications Ordered in Other Visits  Medication Dose Route Frequency Provider Last Rate Last Admin   etoposide (VEPESID) 210 mg in sodium chloride 0.9 % 1,000 mL chemo infusion  120 mg/m2 (Treatment Plan Recorded) Intravenous Once 07/11/2022, NP       heparin lock flush 100 UNIT/ML injection            heparin lock flush 100 unit/mL  500 Units Intravenous Once Alinda Dooms R, MD       morphine (PF) 2 MG/ML injection            sodium chloride flush (NS) 0.9 % injection 10 mL  10 mL Intravenous PRN Louretta Shorten, MD   10 mL at 03/01/22 0857      .  PHYSICAL EXAMINATION: ECOG PERFORMANCE STATUS: 0 - Asymptomatic  Vitals:   10/18/22 0927  BP: 109/71  Pulse: 72  Resp: 19  Temp: 98.6 F (37 C)  SpO2: 95%          Filed Weights   10/18/22 0927  Weight: 146 lb 3.2 oz (66.3 kg)           Physical Exam HENT:     Head: Normocephalic and atraumatic.      Mouth/Throat:     Pharynx: No oropharyngeal exudate.  Eyes:     Pupils: Pupils are equal, round, and reactive to light.  Cardiovascular:     Rate and Rhythm: Normal rate and regular rhythm.  Pulmonary:     Effort: Pulmonary effort is normal. No respiratory distress.     Breath sounds: Normal breath sounds. No wheezing.  Abdominal:     General: Bowel sounds are normal. There is no distension.     Palpations: Abdomen is soft. There is no mass.     Tenderness: There is no abdominal tenderness. There is no guarding or rebound.  Musculoskeletal:        General: No tenderness. Normal range of motion.     Cervical back: Normal range of motion and neck supple.  Skin:    General: Skin is warm.  Neurological:     Mental Status: She is alert and oriented to person, place, and time.  Psychiatric:        Mood and Affect: Affect normal.      LABORATORY DATA:  I have reviewed the data as listed Lab Results  Component Value Date   WBC 14.3 (H) 10/18/2022   HGB 12.0 10/18/2022   HCT 35.3 (L) 10/18/2022   MCV 90.1 10/18/2022   PLT 380 10/18/2022   Recent Labs    09/06/22 1005 09/27/22 0820 10/18/22 0909  NA 135 140 136  K 3.6 3.9 3.6  CL 105 107 106  CO2 24 25 26   GLUCOSE 102* 114* 97  BUN 9 12 11   CREATININE 0.45 0.50 0.54  CALCIUM 8.8* 9.0 8.7*  GFRNONAA >60 >60 >60  PROT 7.0 6.9 6.8  ALBUMIN 3.5 3.4* 3.8  AST 13* 15 20  ALT 8 10 10   ALKPHOS 83 100 101  BILITOT 0.4 0.3 0.3    RADIOGRAPHIC STUDIES: I have personally reviewed the radiological images as listed and agreed with the findings in the report. No results found.   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 SEP 6th 2023-PET scan- Two hypermetabolic LEFT hepatic lobe metastasis. Anterior lesion is increased in size from comparison CT;   Intense hypermetabolic RIGHT scapular metastasis with soft tissue extension;  No new areas of metastatic disease. SEP 2023-liver biopsy shows neuroendocrine carcinoma/large cell; no adenocarcinoma noted.   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 Given the progressive disease-patient currently on chemotherapy with carboplatin etoposide [last received in February 2023]; without immunotherapy [progressed while on Atezo].  # Proceed with cycle #4.  of carboplatin etoposide [no Atezo]- Labs today reviewed;  acceptable for treatment today. D-4 05-26-1970. Will do a scan after 4th cycle; will order today-  # mets to bone/ Pain right shoulder- currently s/p RT [ 09/16/2021]; STABLE;  continue percocet 10 mg every 4-6 hours also. S/p  RT on Oct 16th , 2023-. 10 fractions. OFF  fentanyl patch; On oxycontin; and oxycodone as per palliative care. STABLE. Refilled by 14/04/2021. Wants to Hold off IR ablation as pain is improved.   # Hypokalemia: K- 3.6; discussed re: dietary supp-STABLE.  # constipation:  Miralax BID; fluids; Dulcolax- STABLE.  # DM:  On Metformin- STABLE.   # COPD: refilled albuterol;STABLE.  #IV access: port functional-  functional  # DISPOSITION:  # chemo today; and this week as planned.  # follow up in 4  weeks-  MD: labs- cbc/cmp; no chemo-  CT CAP- Dr.B        All questions were answered. The patient knows to call the clinic with any problems, questions or concerns.    Oct 18, MD 10/25/2022 9:45 AM

## 2022-10-18 NOTE — Progress Notes (Signed)
Clarified medication order with [facility], Per [facility] give Carboplatin prior to Etoposide as ordered on the [facility].

## 2022-10-18 NOTE — Assessment & Plan Note (Addendum)
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 SEP 6th 2023-PET scan- Two hypermetabolic LEFT hepatic lobe metastasis. Anterior lesion is increased in size from comparison CT;  Intense hypermetabolic RIGHT scapular metastasis with soft tissue extension;  No new areas of metastatic disease. SEP 2023-liver biopsy shows neuroendocrine carcinoma/large cell; no adenocarcinoma noted.   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 Given the progressive disease-patient currently on chemotherapy with carboplatin etoposide [last received in February 2023]; without immunotherapy [progressed while on Atezo].  # Proceed with cycle #4.  of carboplatin etoposide [no Atezo]- Labs today reviewed;  acceptable for treatment today. D-4 Ellen Henri. Will do a scan after 4th cycle; will order today-  # mets to bone/ Pain right shoulder- currently s/p RT [ 09/16/2021]; STABLE;  continue percocet 10 mg every 4-6 hours also. S/p  RT on Oct 16th , 2023-. 10 fractions. OFF  fentanyl patch; On oxycontin; and oxycodone as per palliative care. STABLE. Refilled by Merrily Pew. Wants to Hold off IR ablation as pain is improved.   # Hypokalemia: K- 3.6; discussed re: dietary supp-STABLE.  # constipation:  Miralax BID; fluids; Dulcolax- STABLE.  # DM:  On Metformin- STABLE.   # COPD: refilled albuterol;STABLE.  #IV access: port functional-  functional  # DISPOSITION:  # chemo today; and this week as planned.  # follow up in 4  weeks-  MD: labs- cbc/cmp; no chemo-  CT CAP- Dr.B

## 2022-10-19 ENCOUNTER — Other Ambulatory Visit: Payer: Self-pay | Admitting: Internal Medicine

## 2022-10-19 ENCOUNTER — Telehealth: Payer: Self-pay | Admitting: Internal Medicine

## 2022-10-19 ENCOUNTER — Inpatient Hospital Stay: Payer: Medicaid Other

## 2022-10-19 DIAGNOSIS — C3411 Malignant neoplasm of upper lobe, right bronchus or lung: Secondary | ICD-10-CM

## 2022-10-19 MED ORDER — SODIUM CHLORIDE 0.9 % IV SOLN
120.0000 mg/m2 | Freq: Once | INTRAVENOUS | Status: DC
  Filled 2022-10-19: qty 10.5

## 2022-10-19 MED FILL — Dexamethasone Sodium Phosphate Inj 100 MG/10ML: INTRAMUSCULAR | Qty: 1 | Status: AC

## 2022-10-19 NOTE — Telephone Encounter (Signed)
pt scheduled for treatment Mon,Tues, and Wed. Pt called in stating that she could not make the appt for today. Not feeling well. Will be here tommorrow. please advise on rescheduling.

## 2022-10-20 ENCOUNTER — Inpatient Hospital Stay (HOSPITAL_BASED_OUTPATIENT_CLINIC_OR_DEPARTMENT_OTHER): Payer: Medicaid Other | Admitting: Hospice and Palliative Medicine

## 2022-10-20 ENCOUNTER — Inpatient Hospital Stay: Payer: Medicaid Other

## 2022-10-20 VITALS — BP 110/60 | HR 75 | Temp 98.1°F | Resp 18

## 2022-10-20 DIAGNOSIS — C3411 Malignant neoplasm of upper lobe, right bronchus or lung: Secondary | ICD-10-CM

## 2022-10-20 DIAGNOSIS — Z5111 Encounter for antineoplastic chemotherapy: Secondary | ICD-10-CM | POA: Diagnosis not present

## 2022-10-20 MED ORDER — HEPARIN SOD (PORK) LOCK FLUSH 100 UNIT/ML IV SOLN
500.0000 [IU] | Freq: Once | INTRAVENOUS | Status: AC | PRN
  Administered 2022-10-20: 500 [IU]
  Filled 2022-10-20: qty 5

## 2022-10-20 MED ORDER — SODIUM CHLORIDE 0.9 % IV SOLN
120.0000 mg/m2 | Freq: Once | INTRAVENOUS | Status: AC
  Administered 2022-10-20: 210 mg via INTRAVENOUS
  Filled 2022-10-20: qty 10.5

## 2022-10-20 MED ORDER — SODIUM CHLORIDE 0.9 % IV SOLN
10.0000 mg | Freq: Once | INTRAVENOUS | Status: AC
  Administered 2022-10-20: 10 mg via INTRAVENOUS
  Filled 2022-10-20: qty 10

## 2022-10-20 MED ORDER — SODIUM CHLORIDE 0.9% FLUSH
10.0000 mL | INTRAVENOUS | Status: DC | PRN
  Administered 2022-10-20: 10 mL
  Filled 2022-10-20: qty 10

## 2022-10-20 MED ORDER — SODIUM CHLORIDE 0.9 % IV SOLN
Freq: Once | INTRAVENOUS | Status: AC
  Filled 2022-10-20: qty 250

## 2022-10-20 MED FILL — Dexamethasone Sodium Phosphate Inj 100 MG/10ML: INTRAMUSCULAR | Qty: 1 | Status: AC

## 2022-10-20 NOTE — Patient Instructions (Signed)
[facility]CER [facility] AT [place]  Discharge Instructions: Thank you for choosing [facility] to provide your oncology and hematology care.  If you have a lab appointment with the [facility], please go directly to the [facility] and check in at the registration area.  Wear comfortable clothing and clothing appropriate for easy access to any Portacath or PICC line.   We strive to give you quality time with your provider. You may need to reschedule your appointment if you arrive late (15 or more minutes).  Arriving late affects you and other patients whose appointments are after yours.  Also, if you miss three or more appointments without notifying the office, you may be dismissed from the clinic at the provider's discretion.      For prescription refill requests, have your pharmacy contact our office and allow 72 hours for refills to be completed.    Today you received the following chemotherapy and/or immunotherapy agents ETOPOSIDE      To help prevent nausea and vomiting after your treatment, we encourage you to take your nausea medication as directed.  BELOW ARE SYMPTOMS THAT SHOULD BE REPORTED IMMEDIATELY: *FEVER GREATER THAN 100.4 F (38 C) OR HIGHER *CHILLS OR SWEATING *NAUSEA AND VOMITING THAT IS NOT CONTROLLED WITH YOUR NAUSEA MEDICATION *UNUSUAL SHORTNESS OF BREATH *UNUSUAL BRUISING OR BLEEDING *URINARY PROBLEMS (pain or burning when urinating, or frequent urination) *BOWEL PROBLEMS (unusual diarrhea, constipation, pain near the anus) TENDERNESS IN MOUTH AND THROAT WITH OR WITHOUT PRESENCE OF ULCERS (sore throat, sores in mouth, or a toothache) UNUSUAL RASH, SWELLING OR PAIN  UNUSUAL VAGINAL DISCHARGE OR ITCHING   Items with * indicate a potential emergency and should be followed up as soon as possible or go to the Emergency Department if any problems should occur.  Please show the CHEMOTHERAPY ALERT CARD or IMMUNOTHERAPY ALERT CARD at check-in to  the Emergency Department and triage nurse.  Should you have questions after your visit or need to cancel or reschedule your appointment, please contact [facility]CER [place] AT [place]  [phone] and follow the prompts.  Office hours are 8:00 a.m. to 4:30 p.m. Monday - Friday. Please note that voicemails left after 4:00 p.m. may not be returned until the following business day.  We are closed weekends and major holidays. You have access to a nurse at all times for urgent questions. Please call the main number to the clinic [phone] and follow the prompts.  For any non-urgent questions, you may also contact your provider using MyChart. We now offer e-Visits for anyone 47 and older to request care online for non-urgent symptoms. For details visit [email or web address].   Also download the MyChart app! Go to the app store, search "MyChart", open the app, select [facility], and log in with your MyChart username and password.  Etoposide Capsules What is this medication? ETOPOSIDE (e toe POE side) treats lung cancer. It works by slowing down the growth of cancer cells. This medicine may be used for other purposes; ask your health care provider or pharmacist if you have questions. COMMON BRAND NAME(S): VePesid What should I tell my care team before I take this medication? They need to know if you have any of these conditions: Infection Kidney disease Liver disease Low blood counts, such as low white cell, platelet, red cell counts An unusual or allergic reaction to etoposide, other medications, foods, dyes, or preservatives Pregnant or trying to get pregnant Breastfeeding How should I use this medication? Take this medication  by mouth with a glass of water. Take it as directed on the prescription label. Do not cut, crush, or chew this medication. Swallow the capsules whole. Do not take it more often than directed. Keep taking it unless your care team tells you to stop. Handling  this medication may be harmful. Wear gloves while touching this medication or bottle. Talk to your care team about how to handle this medication. Special instructions may apply. Talk to your care team about the use of this medication in children. Special care may be needed. Overdosage: If you think you have taken too much of this medicine contact a poison control center or emergency room at once. NOTE: This medicine is only for you. Do not share this medicine with others. What if I miss a dose? If you miss a dose, take it as soon as you can. If it is almost time for your next dose, take only that dose. Do not take double or extra doses. What may interact with this medication? Cyclosporine Warfarin This list may not describe all possible interactions. Give your health care provider a list of all the medicines, herbs, non-prescription drugs, or dietary supplements you use. Also tell them if you smoke, drink alcohol, or use illegal drugs. Some items may interact with your medicine. What should I watch for while using this medication? Your condition will be monitored carefully while you are receiving this medication. This medication may make you feel generally unwell. This is not uncommon as chemotherapy can affect healthy cells as well as cancer cells. Report any side effects. Continue your course of treatment even though you feel ill unless your care team tells you to stop. This medication may increase your risk of getting an infection. Call your care team for advice if you get a fever, chills, sore throat, or other symptoms of a cold or flu. Do not treat yourself. Try to avoid being around people who are sick. This medication may increase your risk to bruise or bleed. Call your care team if you notice any unusual bleeding. Talk to your care team about your risk of cancer. You may be more at risk for certain types of cancers if you take this medication. Talk to your care team if you may be pregnant.  Serious birth defects can occur if you take this medication during pregnancy and for 6 months after the last dose. You will need a negative pregnancy test before starting this medication. Contraception is recommended while taking this medication and for 6 months after the last dose. Your care team can help you find the option that works for you. If your partner can get pregnant, use a condom during sex while taking this medication and for 4 months after the last dose. Do not breastfeed while taking this medication. This medication may cause infertility. Talk to your care team if you are concerned about your fertility. What side effects may I notice from receiving this medication? Side effects that you should report to your care team as soon as possible: Allergic reactions--skin rash, itching, hives, swelling of the face, lips, tongue, or throat Infection--fever, chills, cough, sore throat, wounds that don't heal, pain or trouble when passing urine, general feeling of discomfort or being unwell Low red blood cell level--unusual weakness or fatigue, dizziness, headache, trouble breathing Unusual bruising or bleeding Side effects that usually do not require medical attention (report to your care team if they continue or are bothersome): Diarrhea Fatigue Hair loss Loss of appetite Nausea Pain,  redness, or swelling with sores inside the mouth or throat Vomiting This list may not describe all possible side effects. Call your doctor for medical advice about side effects. You may report side effects to FDA at 1-800-FDA-1088. Where should I keep my medication? Keep out of the reach of children and pets. Store in a refrigerator between 2 and 8 degrees C (36 and 46 degrees F). Do not freeze. Get rid any unused medication after the expiration date. To get rid of medications that are no longer needed or have expired: Take the medication to a medication take-back program. Check with your pharmacy or law  enforcement to find a location. If you cannot return the medication, ask your pharmacist or care team how to get rid of this medication safely. NOTE: This sheet is a summary. It may not cover all possible information. If you have questions about this medicine, talk to your doctor, pharmacist, or health care provider.  2023 Elsevier/Gold Standard (2022-02-17 00:00:00)

## 2022-10-20 NOTE — Progress Notes (Signed)
Voicemail left.  Will reschedule.

## 2022-10-21 ENCOUNTER — Inpatient Hospital Stay: Payer: Medicaid Other

## 2022-10-21 VITALS — BP 124/68 | HR 66 | Temp 98.2°F | Resp 18

## 2022-10-21 DIAGNOSIS — Z5111 Encounter for antineoplastic chemotherapy: Secondary | ICD-10-CM | POA: Diagnosis not present

## 2022-10-21 DIAGNOSIS — C3411 Malignant neoplasm of upper lobe, right bronchus or lung: Secondary | ICD-10-CM

## 2022-10-21 MED ORDER — SODIUM CHLORIDE 0.9 % IV SOLN
10.0000 mg | Freq: Once | INTRAVENOUS | Status: AC
  Administered 2022-10-21: 10 mg via INTRAVENOUS
  Filled 2022-10-21: qty 10
  Filled 2022-10-21: qty 1

## 2022-10-21 MED ORDER — SODIUM CHLORIDE 0.9 % IV SOLN
120.0000 mg/m2 | Freq: Once | INTRAVENOUS | Status: AC
  Administered 2022-10-21: 210 mg via INTRAVENOUS
  Filled 2022-10-21: qty 10.5

## 2022-10-21 MED ORDER — SODIUM CHLORIDE 0.9 % IV SOLN
Freq: Once | INTRAVENOUS | Status: AC
  Filled 2022-10-21: qty 250

## 2022-10-22 ENCOUNTER — Inpatient Hospital Stay: Payer: Medicaid Other

## 2022-10-22 DIAGNOSIS — C3411 Malignant neoplasm of upper lobe, right bronchus or lung: Secondary | ICD-10-CM

## 2022-10-22 DIAGNOSIS — Z5111 Encounter for antineoplastic chemotherapy: Secondary | ICD-10-CM | POA: Diagnosis not present

## 2022-10-22 MED ORDER — PEGFILGRASTIM-CBQV 6 MG/0.6ML ~~LOC~~ SOSY
6.0000 mg | PREFILLED_SYRINGE | Freq: Once | SUBCUTANEOUS | Status: AC
  Administered 2022-10-22: 6 mg via SUBCUTANEOUS
  Filled 2022-10-22: qty 0.6

## 2022-10-25 ENCOUNTER — Other Ambulatory Visit: Payer: Self-pay | Admitting: *Deleted

## 2022-10-25 ENCOUNTER — Encounter: Payer: Self-pay | Admitting: Internal Medicine

## 2022-10-25 ENCOUNTER — Other Ambulatory Visit: Payer: Self-pay

## 2022-10-25 MED ORDER — OXYCODONE-ACETAMINOPHEN 10-325 MG PO TABS
1.0000 | ORAL_TABLET | ORAL | 0 refills | Status: DC | PRN
  Filled 2022-10-25: qty 60, 5d supply, fill #0

## 2022-11-05 ENCOUNTER — Other Ambulatory Visit: Payer: Self-pay

## 2022-11-05 ENCOUNTER — Other Ambulatory Visit: Payer: Self-pay | Admitting: *Deleted

## 2022-11-05 MED ORDER — OXYCODONE-ACETAMINOPHEN 10-325 MG PO TABS
1.0000 | ORAL_TABLET | ORAL | 0 refills | Status: DC | PRN
  Filled 2022-11-05: qty 60, 5d supply, fill #0

## 2022-11-05 MED ORDER — OXYCODONE HCL ER 20 MG PO T12A
20.0000 mg | EXTENDED_RELEASE_TABLET | Freq: Two times a day (BID) | ORAL | 0 refills | Status: DC
  Filled 2022-11-05: qty 60, 30d supply, fill #0

## 2022-11-08 ENCOUNTER — Other Ambulatory Visit: Payer: Self-pay

## 2022-11-11 ENCOUNTER — Ambulatory Visit
Admission: RE | Admit: 2022-11-11 | Discharge: 2022-11-11 | Disposition: A | Discharge status: Home / Self Care | Payer: Medicaid Other | Source: Ambulatory Visit | Attending: Internal Medicine | Admitting: Internal Medicine

## 2022-11-11 DIAGNOSIS — C3411 Malignant neoplasm of upper lobe, right bronchus or lung: Secondary | ICD-10-CM | POA: Insufficient documentation

## 2022-11-11 MED ORDER — IOHEXOL 300 MG/ML  SOLN
100.0000 mL | Freq: Once | INTRAMUSCULAR | Status: AC | PRN
  Administered 2022-11-11: 100 mL via INTRAVENOUS

## 2022-11-17 ENCOUNTER — Inpatient Hospital Stay (HOSPITAL_BASED_OUTPATIENT_CLINIC_OR_DEPARTMENT_OTHER): Payer: Medicaid Other | Admitting: Internal Medicine

## 2022-11-17 ENCOUNTER — Encounter: Payer: Self-pay | Admitting: Internal Medicine

## 2022-11-17 ENCOUNTER — Inpatient Hospital Stay: Payer: Medicaid Other | Attending: Radiation Oncology

## 2022-11-17 VITALS — BP 107/72 | HR 64 | Wt 142.0 lb

## 2022-11-17 DIAGNOSIS — Z7984 Long term (current) use of oral hypoglycemic drugs: Secondary | ICD-10-CM | POA: Diagnosis not present

## 2022-11-17 DIAGNOSIS — E119 Type 2 diabetes mellitus without complications: Secondary | ICD-10-CM | POA: Diagnosis not present

## 2022-11-17 DIAGNOSIS — D701 Agranulocytosis secondary to cancer chemotherapy: Secondary | ICD-10-CM | POA: Diagnosis not present

## 2022-11-17 DIAGNOSIS — Z7952 Long term (current) use of systemic steroids: Secondary | ICD-10-CM | POA: Diagnosis not present

## 2022-11-17 DIAGNOSIS — K439 Ventral hernia without obstruction or gangrene: Secondary | ICD-10-CM | POA: Insufficient documentation

## 2022-11-17 DIAGNOSIS — C3411 Malignant neoplasm of upper lobe, right bronchus or lung: Secondary | ICD-10-CM

## 2022-11-17 DIAGNOSIS — T451X5A Adverse effect of antineoplastic and immunosuppressive drugs, initial encounter: Secondary | ICD-10-CM | POA: Diagnosis not present

## 2022-11-17 DIAGNOSIS — C787 Secondary malignant neoplasm of liver and intrahepatic bile duct: Secondary | ICD-10-CM | POA: Insufficient documentation

## 2022-11-17 DIAGNOSIS — D3502 Benign neoplasm of left adrenal gland: Secondary | ICD-10-CM | POA: Insufficient documentation

## 2022-11-17 DIAGNOSIS — F17210 Nicotine dependence, cigarettes, uncomplicated: Secondary | ICD-10-CM | POA: Insufficient documentation

## 2022-11-17 DIAGNOSIS — I7143 Infrarenal abdominal aortic aneurysm, without rupture: Secondary | ICD-10-CM | POA: Insufficient documentation

## 2022-11-17 DIAGNOSIS — Z9221 Personal history of antineoplastic chemotherapy: Secondary | ICD-10-CM | POA: Insufficient documentation

## 2022-11-17 DIAGNOSIS — R809 Proteinuria, unspecified: Secondary | ICD-10-CM | POA: Diagnosis not present

## 2022-11-17 DIAGNOSIS — Z79899 Other long term (current) drug therapy: Secondary | ICD-10-CM | POA: Insufficient documentation

## 2022-11-17 DIAGNOSIS — Z5111 Encounter for antineoplastic chemotherapy: Secondary | ICD-10-CM | POA: Insufficient documentation

## 2022-11-17 LAB — CBC WITH DIFFERENTIAL/PLATELET
Abs Immature Granulocytes: 0.02 10*3/uL (ref 0.00–0.07)
Basophils Absolute: 0.1 10*3/uL (ref 0.0–0.1)
Basophils Relative: 1 %
Eosinophils Absolute: 0 10*3/uL (ref 0.0–0.5)
Eosinophils Relative: 0 %
HCT: 37.4 % (ref 36.0–46.0)
Hemoglobin: 12.2 g/dL (ref 12.0–15.0)
Immature Granulocytes: 0 %
Lymphocytes Relative: 36 %
Lymphs Abs: 2.7 10*3/uL (ref 0.7–4.0)
MCH: 31.5 pg (ref 26.0–34.0)
MCHC: 32.6 g/dL (ref 30.0–36.0)
MCV: 96.6 fL (ref 80.0–100.0)
Monocytes Absolute: 0.8 10*3/uL (ref 0.1–1.0)
Monocytes Relative: 11 %
Neutro Abs: 3.8 10*3/uL (ref 1.7–7.7)
Neutrophils Relative %: 52 %
Platelets: 370 10*3/uL (ref 150–400)
RBC: 3.87 MIL/uL (ref 3.87–5.11)
RDW: 21.1 % — ABNORMAL HIGH (ref 11.5–15.5)
WBC: 7.4 10*3/uL (ref 4.0–10.5)
nRBC: 0 % (ref 0.0–0.2)

## 2022-11-17 LAB — COMPREHENSIVE METABOLIC PANEL
ALT: 10 U/L (ref 0–44)
AST: 19 U/L (ref 15–41)
Albumin: 4 g/dL (ref 3.5–5.0)
Alkaline Phosphatase: 94 U/L (ref 38–126)
Anion gap: 8 (ref 5–15)
BUN: 10 mg/dL (ref 8–23)
CO2: 25 mmol/L (ref 22–32)
Calcium: 9.4 mg/dL (ref 8.9–10.3)
Chloride: 104 mmol/L (ref 98–111)
Creatinine, Ser: 0.5 mg/dL (ref 0.44–1.00)
GFR, Estimated: >60 mL/min (ref >60)
Glucose, Bld: 92 mg/dL (ref 70–99)
Potassium: 3.6 mmol/L (ref 3.5–5.1)
Sodium: 137 mmol/L (ref 135–145)
Total Bilirubin: 0.4 mg/dL (ref 0.3–1.2)
Total Protein: 7.2 g/dL (ref 6.5–8.1)

## 2022-11-17 NOTE — Progress Notes (Signed)
Reports her stomach feels tight ever since she finished her last chemotherapy. Occ mild nusea. Pt has congested cough, chronic in nature. Sputum is white. Has some fatigue. Taking pain meds as ordered for pain in her stomach.

## 2022-11-17 NOTE — Assessment & Plan Note (Addendum)
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 SEP 2023-liver biopsy shows neuroendocrine carcinoma/large cell; no adenocarcinoma noted.   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 Patient currently on chemotherapy with carboplatin etoposide x4 cycles- without immunotherapy [progressed while on Atezo].CT scan FEB 1st, 2024- Two new solid pulmonary nodules, largest 0.8 cm in the posterior left upper lobe, suspicious for new pulmonary metastases. Two enlarging left liver metastases, including substantial growth of the dominant bulky 9.7 cm segment 3 left liver metastasis. Stable expansile mixed lytic and sclerotic right scapular body metastasis.  # Discussed the use of Lubrinectidin-second line setting small cell lung cancer; again reviewed the modest/limited response rates given patient refractoriness to carbo-etoposide therapy. Discussed unfortunately given the progression of disease/refractory disease-prognosis is unfortunately poor.  Patient is also at high risk of complications from third line therapy.    #  She understands high risk of complications from chemotherapy.  Also discussed about possibility of hospice if patient chooses to forego any therapy.  In the absence of any chemotherapy the average survival would be less than 6 months if lung cancer/liver mets take the natural course.  See discussion below regarding hospice.  # mets to bone/ Pain right shoulder- currently s/p RT [ 09/16/2021]; STABLE;  continue percocet 10 mg every 4-6 hours also. S/p  RT on Oct 16th , 2023-. 10 fractions. OFF  fentanyl patch; On oxycontin; and oxycodone as per palliative care.  Refilled by [facility]. Wants to Hold off IR ablation as pain is improved. stable  # Hypokalemia: K- 3.6; discussed re: dietary supp-stable  # constipation:  Miralax BID; fluids; Dulcolax- stable  # DM:  On  Metformin- stable  # COPD: refilled albuterol; stable  #IV access: port functional-  functional  # Goals of care/prognosis discussion : Given overall poor prognosis hospice is reasonable if patient choose to forego therapy given the concern for side effects.  I introduced hospice philosophy to the patient.  Discussed that in hospice goal of care will directed to symptom management rather than treating the underlying disease; and in the process help improve quality of life rather than quantity.  Discussed with hospice team would include-nurse, nurse aide, social worker and chaplain for help take care of patient with physical/emotional needs.  Patient's mother went through hospice 5 to 6 years ago.  However patient is leaning to therapy at this time.  Hold off hospice for now.  However will have patient follow-up with Josh for continued discussions regarding goals of care/symptom management.  I counseled the patient to bring assisted to the next appointment.  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 Stable left adrenal adenoma, Stable myomatous uterus. Stable small to moderate fat containing midline supraumbilical ventral abdominal hernia. Aortic Atherosclerosis and Emphysema reviewed/discussed/counseled the patient.   # DISPOSITION:  # follow up in 2/16-   MD: labs- cbc/cmp;LDH-  lurbinectedin [new]  # follow up with Josh/ on 2/16- re: palliative care/goals of care-Dr.B   # I reviewed the blood work- with the patient in detail; also reviewed the imaging independently [as summarized above]; and with the patient in detail.   # 40 minutes face-to-face with the patient discussing the above plan of care; more than 50% of time spent on prognosis/ natural history; counseling and coordination.

## 2022-11-17 NOTE — Progress Notes (Signed)
DISCONTINUE OFF PATHWAY REGIMEN - Other   [id number]:Carboplatin AUC=5 D1 + Etoposide 120 mg/m2 D1-3 q28 Days:   A cycle is every 28 days:     Carboplatin      Etoposide   **Always confirm dose/schedule in your pharmacy ordering system**  REASON: Disease Progression PRIOR TREATMENT: Carboplatin AUC=5 D1 + Etoposide 120 mg/m2 D1-3 q28 Days TREATMENT RESPONSE: Progressive Disease (PD)  START OFF PATHWAY REGIMEN - Other   [id number]:Lurbinectedin 3.2 mg/m2 IV D1 q21 Days:   A cycle is every 21 days:     Lurbinectedin   **Always confirm dose/schedule in your pharmacy ordering system**  Patient Characteristics: Intent of Therapy: Non-Curative / Palliative Intent, Discussed with Patient
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
## 2022-11-23 ENCOUNTER — Other Ambulatory Visit: Payer: Self-pay | Admitting: *Deleted

## 2022-11-23 ENCOUNTER — Other Ambulatory Visit: Payer: Self-pay

## 2022-11-23 MED ORDER — OXYCODONE-ACETAMINOPHEN 10-325 MG PO TABS
1.0000 | ORAL_TABLET | ORAL | 0 refills | Status: DC | PRN
  Filled 2022-11-23: qty 60, 5d supply, fill #0

## 2022-11-25 MED FILL — Dexamethasone Sodium Phosphate Inj 100 MG/10ML: INTRAMUSCULAR | Qty: 1 | Status: AC

## 2022-11-26 ENCOUNTER — Inpatient Hospital Stay (HOSPITAL_BASED_OUTPATIENT_CLINIC_OR_DEPARTMENT_OTHER): Payer: Medicaid Other | Admitting: Internal Medicine

## 2022-11-26 ENCOUNTER — Inpatient Hospital Stay (HOSPITAL_BASED_OUTPATIENT_CLINIC_OR_DEPARTMENT_OTHER): Payer: Medicaid Other | Admitting: Hospice and Palliative Medicine

## 2022-11-26 ENCOUNTER — Inpatient Hospital Stay: Payer: Medicaid Other

## 2022-11-26 ENCOUNTER — Encounter: Payer: Self-pay | Admitting: Internal Medicine

## 2022-11-26 VITALS — BP 121/79 | HR 79 | Temp 96.7°F | Resp 18 | Wt 140.6 lb

## 2022-11-26 DIAGNOSIS — C3411 Malignant neoplasm of upper lobe, right bronchus or lung: Secondary | ICD-10-CM

## 2022-11-26 DIAGNOSIS — Z515 Encounter for palliative care: Secondary | ICD-10-CM

## 2022-11-26 DIAGNOSIS — Z5111 Encounter for antineoplastic chemotherapy: Secondary | ICD-10-CM | POA: Diagnosis not present

## 2022-11-26 LAB — CBC WITH DIFFERENTIAL/PLATELET
Abs Immature Granulocytes: 0.03 10*3/uL (ref 0.00–0.07)
Basophils Absolute: 0 10*3/uL (ref 0.0–0.1)
Basophils Relative: 0 %
Eosinophils Absolute: 0.1 10*3/uL (ref 0.0–0.5)
Eosinophils Relative: 1 %
HCT: 33.8 % — ABNORMAL LOW (ref 36.0–46.0)
Hemoglobin: 11 g/dL — ABNORMAL LOW (ref 12.0–15.0)
Immature Granulocytes: 0 %
Lymphocytes Relative: 22 %
Lymphs Abs: 2.1 10*3/uL (ref 0.7–4.0)
MCH: 31.9 pg (ref 26.0–34.0)
MCHC: 32.5 g/dL (ref 30.0–36.0)
MCV: 98 fL (ref 80.0–100.0)
Monocytes Absolute: 1.4 10*3/uL — ABNORMAL HIGH (ref 0.1–1.0)
Monocytes Relative: 15 %
Neutro Abs: 6.2 10*3/uL (ref 1.7–7.7)
Neutrophils Relative %: 62 %
Platelets: 294 10*3/uL (ref 150–400)
RBC: 3.45 MIL/uL — ABNORMAL LOW (ref 3.87–5.11)
RDW: 18.2 % — ABNORMAL HIGH (ref 11.5–15.5)
WBC: 9.9 10*3/uL (ref 4.0–10.5)
nRBC: 0 % (ref 0.0–0.2)

## 2022-11-26 LAB — COMPREHENSIVE METABOLIC PANEL
ALT: 6 U/L (ref 0–44)
AST: 15 U/L (ref 15–41)
Albumin: 3.7 g/dL (ref 3.5–5.0)
Alkaline Phosphatase: 90 U/L (ref 38–126)
Anion gap: 10 (ref 5–15)
BUN: 11 mg/dL (ref 8–23)
CO2: 24 mmol/L (ref 22–32)
Calcium: 8.8 mg/dL — ABNORMAL LOW (ref 8.9–10.3)
Chloride: 99 mmol/L (ref 98–111)
Creatinine, Ser: 0.48 mg/dL (ref 0.44–1.00)
GFR, Estimated: >60 mL/min (ref >60)
Glucose, Bld: 98 mg/dL (ref 70–99)
Potassium: 3.4 mmol/L — ABNORMAL LOW (ref 3.5–5.1)
Sodium: 133 mmol/L — ABNORMAL LOW (ref 135–145)
Total Bilirubin: 0.6 mg/dL (ref 0.3–1.2)
Total Protein: 7.5 g/dL (ref 6.5–8.1)

## 2022-11-26 LAB — CK: Total CK: 54 U/L (ref 38–234)

## 2022-11-26 MED ORDER — SODIUM CHLORIDE 0.9 % IV SOLN
10.0000 mg | Freq: Once | INTRAVENOUS | Status: AC
  Administered 2022-11-26: 10 mg via INTRAVENOUS
  Filled 2022-11-26: qty 10

## 2022-11-26 MED ORDER — SODIUM CHLORIDE 0.9 % IV SOLN
3.2000 mg/m2 | Freq: Once | INTRAVENOUS | Status: AC
  Administered 2022-11-26: 5.55 mg via INTRAVENOUS
  Filled 2022-11-26: qty 11.1

## 2022-11-26 MED ORDER — HEPARIN SOD (PORK) LOCK FLUSH 100 UNIT/ML IV SOLN
500.0000 [IU] | Freq: Once | INTRAVENOUS | Status: AC | PRN
  Administered 2022-11-26: 500 [IU]
  Filled 2022-11-26: qty 5

## 2022-11-26 MED ORDER — PALONOSETRON HCL INJECTION 0.25 MG/5ML
0.2500 mg | Freq: Once | INTRAVENOUS | Status: AC
  Administered 2022-11-26: 0.25 mg via INTRAVENOUS
  Filled 2022-11-26: qty 5

## 2022-11-26 MED ORDER — SODIUM CHLORIDE 0.9 % IV SOLN
Freq: Once | INTRAVENOUS | Status: AC
  Filled 2022-11-26: qty 250
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
## 2022-11-26 NOTE — Assessment & Plan Note (Addendum)
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 SEP 2023-liver biopsy shows neuroendocrine carcinoma/large cell; no adenocarcinoma noted.   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 Patient currently on chemotherapy with carboplatin etoposide x4 cycles- without immunotherapy [progressed while on Atezo].CT scan FEB 1st, 2024- Two new solid pulmonary nodules, largest 0.8 cm in the posterior left upper lobe, suspicious for new pulmonary metastases. Two enlarging left liver metastases, including substantial growth of the dominant bulky 9.7 cm segment 3 left liver metastasis. Stable expansile mixed lytic and sclerotic right scapular body metastasis.  # proceed with Lubrinectidin cycle #1 today. Labs today reviewed;  acceptable for treatment today.    # She understands high risk of complications from chemotherapy.  Also discussed about possibility of hospice if patient chooses to forego any therapy.  In the absence of any chemotherapy the average survival would be less than 6 months if lung cancer/liver mets take the natural course.   # mets to bone/ Pain right shoulder- currently s/p RT [ 09/16/2021]; STABLE;  continue percocet 10 mg every 4-6 hours also. S/p  RT on Oct 16th , 2023-. 10 fractions. OFF  fentanyl patch; On oxycontin; and oxycodone as per palliative care.  Refilled by Merrily Pew. Wants to Hold off IR ablation as pain is improved. Stable   # Hypokalemia: K- 3.6; discussed re: dietary supp-stable  # constipation:  Miralax BID; fluids; Dulcolax- stable  # DM:  On Metformin-stable  # COPD: refilled albuterol; stable  #IV access: port functional-  functional  # Goals of care: Again reviewed the goals of care being palliative.  Discussed that if patient has progressive decline in performance status or poor tolerance to chemotherapy patient would be recommended hospice.   Also await follow-up with with Josh todayre: palliative care/goals of care.  Patient/sister in agreement.  # DISPOSITION:  # lurbinectdin chemo today # follow up in 1 week- APP- labs- possible IVFs 1 lit /1 hour # follow up in  3 weeks- MD; labs- cbc/cmp; lurbinectdin- Dr.B  # I reviewed the blood work- with the patient in detail; also reviewed the imaging independently [as summarized above]; and with the patient in detail.   # 40 minutes face-to-face with the patient discussing the above plan of care; more than 50% of time spent on prognosis/ natural history; counseling and coordination.

## 2022-11-26 NOTE — Progress Notes (Signed)
[place]  [phone] [phone]   Name: Stacie Kennedy Date: 11/26/2022 MRN: [id number]  DOB: 07-Mar-1961  Patient Care Team: Cammie Sickle, MD as PCP - General (Internal Medicine) Telford Nab, RN as Oncology Nurse Navigator    REASON FOR CONSULTATION: Stacie Kennedy is a 62 y.o. 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  Patient has had severe pain with scapular metastases and was started on XRT.  She is referred to palliative care to help address goals and manage ongoing symptoms.   SOCIAL HISTORY:     reports that she has been smoking cigarettes. She has a 10.75 pack-year smoking history. She has never used smokeless tobacco. She reports current alcohol use of about 2.0 standard drinks of alcohol per week. She reports that she does not use drugs.  Patient lives at home with her boyfriend.  ADVANCE DIRECTIVES:  Does not have  CODE STATUS:   PAST MEDICAL HISTORY: Past Medical History:  Diagnosis Date   Cancer ([place])    lung cancer   Depression    Duodenitis    Dyspnea    Family history of cancer    High cholesterol    Personal history of colonic polyps    Pre-diabetes    Umbilical hernia    UTI (urinary tract infection)     PAST SURGICAL HISTORY:  Past Surgical History:  Procedure Laterality Date   COLONOSCOPY WITH PROPOFOL N/A 03/24/2021   Procedure: COLONOSCOPY WITH PROPOFOL;  Surgeon: Jonathon Bellows, MD;  Location: [facility];  Service: Gastroenterology;  Laterality: N/A;   INTERCOSTAL NERVE BLOCK  09/19/2020   Procedure: INTERCOSTAL NERVE BLOCK;  Surgeon: Melrose Nakayama, MD;  Location: [place];  Service: Thoracic;;   IR IMAGING GUIDED PORT INSERTION  09/23/2021   LUNG LOBECTOMY  Right    LUNG REMOVAL, PARTIAL Right 09/19/2020   NODE DISSECTION Right 09/19/2020   Procedure: NODE DISSECTION;  Surgeon: Melrose Nakayama, MD;  Location: [place];  Service: Thoracic;  Laterality: Right;   VIDEO BRONCHOSCOPY N/A 07/08/2021   Procedure: VIDEO BRONCHOSCOPY;  Surgeon: Melrose Nakayama, MD;  Location: [place];  Service: Thoracic;  Laterality: N/A;    HEMATOLOGY/ONCOLOGY HISTORY:  Oncology History Overview Note  # NOV -[id number] [LUNG CANCER SCREENING PROGRAM]-18 mm right upper lobe lung nodule; [place] 2021- s/p right upper lobectomy [Dr. Roxan Hockey; [place]]; STAGE: I [pT-18 mm; LN-12=0]; predominant large cell neuroendocrine; minor adenocarcinoma.  Declines adjuvant chemotherapy.  #Recurrent/stage IV cancer NOV 2022- PET scan-scapular lesion liver lesion gastrohepatic lymphadenopathy.  11/21- start RT to right scapular lesion  # DEC 3rd, 2022- CARBO=ETOP+TECEN; udenyca #1; SEP 2023- STOP Tencetriq- progression  SEP 2023-liver biopsy shows neuroendocrine carcinoma/large cell; no adenocarcinoma noted.  Discontinue Tecentriq any progression of disease.  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   # OCTOBER, 2023-  # # MAY 2023- Right swollen eye/orbital inflammation/uveitis s/p Zometa status post steroids improved.-reviewed literature case reports noted; less concerning for tumor involvement.  DISCONTINUE ZOMETA.  NGS: Negative for any targetable mutation; PD-L1 0; KEPASAKE*  # SURVIVORSHIP:   # GENETICS:       Total Number of Primary Tumors: 1  Procedure: Lung lobectomy  Specimen Laterality: Right  Tumor Focality: Unifocal  Tumor Site: Upper lobe  Tumor Size: 1.8 cm  Histologic Type:  Combined large cell neuroendocrine carcinoma with a  minor component of lung adenocarcinoma  Visceral Pleura Invasion: Not identified  Direct Invasion of Adjacent Structures: No adjacent structures present  Lymphovascular Invasion: Not identified   Margins: All margins negative for invasive carcinoma       Closest Margin(s) to Invasive Carcinoma: Bronchovascular margin  Treatment Effect: No known presurgical therapy  Regional Lymph Nodes:       Number of Lymph Nodes Involved: 0                            Nodal Sites with Tumor: Not applicable       Number of Lymph Nodes Examined: 12      Primary cancer of right upper lobe of lung ([place])  11/03/2020 Initial Diagnosis   Primary cancer of right upper lobe of lung ([place])   11/03/2020 Cancer Staging   Staging form: Lung, AJCC 8th Edition - Pathologic: Stage IA3 (pT1c, pN0, cM0) - Signed by Cammie Sickle, MD on 11/04/2020   09/14/2021 - 04/30/2022 Chemotherapy   Patient is on Treatment Plan : LUNG SCLC Carboplatin + Etoposide + Atezolizumab Induction q21d / Atezolizumab Maintenance q21d     09/15/2021 - 06/18/2022 Chemotherapy   Patient is on Treatment Plan : LUNG SCLC Carboplatin + Etoposide + Atezolizumab Induction q21d x 4 cycles / Atezolizumab Maintenance q21d     11/09/2021 Cancer Staging   Staging form: Lung, AJCC 8th Edition - Pathologic: Stage IVB (pTX, pNX, cM1c) - Signed by Cammie Sickle, MD on 11/09/2021   06/22/2022 Pathology Results   Liver biopsy showed metastatic cancer with neuroendocrine features    08/09/2022 - 10/22/2022 Chemotherapy   Patient is on Treatment Plan : BRAIN Carboplatin (AUC 5) D1 + Etoposide (120) D1-3 q28d     11/26/2022 -  Chemotherapy   Patient is on Treatment Plan : LUNG SMALL CELL Lurbinectedin q21d       ALLERGIES:  has No Known Allergies.  MEDICATIONS:  Current Outpatient Medications  Medication Sig Dispense Refill   albuterol (PROVENTIL HFA) 108 (90 Base) MCG/ACT inhaler Inhale 2 puffs into the lungs once every 6 (six) hours as needed for wheezing or shortness of breath. 18 g 2   DULoxetine (CYMBALTA) 30 MG capsule Take 1 capsule (30 mg total) by mouth daily. (Patient not taking: Reported on 09/06/2022) 30 capsule 3    lidocaine (LIDODERM) 5 % Place 1 patch onto the skin daily. Remove & Discard patch within 12 hours or as directed by MD (Patient not taking: Reported on 06/29/2022) 30 patch 2   lidocaine-prilocaine (EMLA) cream Apply one application topically the the affected area(s) daily as needed. 30 g 3   loratadine (CLARITIN) 10 MG tablet Take 1 tablet (10 mg total) by mouth daily. 30 tablet 2   metFORMIN (GLUCOPHAGE) 500 MG tablet Take 500 mg by mouth daily with breakfast.     naloxone (NARCAN) nasal spray 4 mg/0.1 mL SPRAY 1 SPRAY INTO ONE NOSTRIL AS DIRECTED FOR OPIOID OVERDOSE (TURN PERSON ON SIDE AFTER DOSE. IF NO RESPONSE IN 2-3 MINUTES OR PERSON RESPONDS BUT RELAPSES, REPEAT USING A NEW SPRAY DEVICE AND SPRAY INTO THE OTHER NOSTRIL. CALL 911 AFTER USE.) * EMERGENCY USE ONLY * (Patient not taking: Reported on 09/27/2022) 2 each 0   ondansetron (ZOFRAN) 4 MG tablet TAKE 2 TABLETS (8MG ) BY MOUTH ONCE EVERY 8 HOURS AS NEEDED FOR NAUSEA OR VOMITING. 80 tablet 1   oxyCODONE (OXYCONTIN)  20 mg 12 hr tablet Take 1 tablet (20 mg total) by mouth every 12 (twelve) hours. 60 tablet 0   oxyCODONE-acetaminophen (PERCOCET) 10-325 MG tablet Take 1-2 tablets by mouth every 4 (four) hours as needed for pain. 60 tablet 0   predniSONE (DELTASONE) 20 MG tablet Take 1 tablet (20 mg total) by mouth daily with breakfast. Once a day with food. (Patient not taking: Reported on 09/06/2022) 30 tablet 0   prochlorperazine (COMPAZINE) 10 MG tablet Take 1 tablet (10 mg total) by mouth once every 6 (six) hours as needed for nausea or vomiting. (Patient not taking: Reported on 07/09/2022) 40 tablet 1   No current facility-administered medications for this visit.   Facility-Administered Medications Ordered in Other Visits  Medication Dose Route Frequency Provider Last Rate Last Admin   heparin lock flush 100 UNIT/ML injection            heparin lock flush 100 unit/mL  500 Units Intravenous Once Charlaine Dalton R, MD       heparin lock  flush 100 unit/mL  500 Units Intracatheter Once PRN Cammie Sickle, MD       lurbinectedin (ZEPZELCA) 5.55 mg in sodium chloride 0.9 % 250 mL chemo infusion  3.2 mg/m2 (Treatment Plan Recorded) Intravenous Once Cammie Sickle, MD       morphine (PF) 2 MG/ML injection            sodium chloride flush (NS) 0.9 % injection 10 mL  10 mL Intravenous PRN Cammie Sickle, MD   10 mL at 03/01/22 0857    VITAL SIGNS: There were no vitals taken for this visit. There were no vitals filed for this visit.   Estimated body mass index is 22.02 kg/m as calculated from the following:   Height as of 09/06/22: 5\' 7"  (1.702 m).   Weight as of an earlier encounter on 11/26/22: 140 lb 9.6 oz (63.8 kg).  LABS: CBC:    Component Value Date/Time   WBC 9.9 11/26/2022 0904   HGB 11.0 (L) 11/26/2022 0904   HGB 15.5 11/26/2020 1214   HCT 33.8 (L) 11/26/2022 0904   HCT 47.6 (H) 11/26/2020 1214   PLT 294 11/26/2022 0904   PLT 317 11/26/2020 1214   MCV 98.0 11/26/2022 0904   MCV 87 11/26/2020 1214   NEUTROABS 6.2 11/26/2022 0904   NEUTROABS 2.5 11/26/2020 1214   LYMPHSABS 2.1 11/26/2022 0904   LYMPHSABS 3.6 (H) 11/26/2020 1214   MONOABS 1.4 (H) 11/26/2022 0904   EOSABS 0.1 11/26/2022 0904   EOSABS 0.2 11/26/2020 1214   BASOSABS 0.0 11/26/2022 0904   BASOSABS 0.1 11/26/2020 1214   Comprehensive Metabolic Panel:    Component Value Date/Time   NA 133 (L) 11/26/2022 0904   NA 139 11/26/2020 1214   K 3.4 (L) 11/26/2022 0904   CL 99 11/26/2022 0904   CO2 24 11/26/2022 0904   BUN 11 11/26/2022 0904   BUN 8 11/26/2020 1214   CREATININE 0.48 11/26/2022 0904   GLUCOSE 98 11/26/2022 0904   CALCIUM 8.8 (L) 11/26/2022 0904   AST 15 11/26/2022 0904   ALT 6 11/26/2022 0904   ALKPHOS 90 11/26/2022 0904   BILITOT 0.6 11/26/2022 0904   BILITOT 0.3 11/26/2020 1214   PROT 7.5 11/26/2022 0904   PROT 6.9 11/26/2020 1214   ALBUMIN 3.7 11/26/2022 0904   ALBUMIN 4.0 11/26/2020 1214     RADIOGRAPHIC STUDIES: CT CHEST ABDOMEN PELVIS W CONTRAST  Result Date: 11/11/2022 CLINICAL DATA:  Non-small cell right upper lobe lung cancer diagnosed in 2021 status post right upper lobectomy, with recurrent metastatic disease in November 2022. Ongoing chemotherapy. Restaging. * Tracking Code: BO * 2 EXAM: CT CHEST, ABDOMEN, AND PELVIS WITH CONTRAST TECHNIQUE: Multidetector CT imaging of the chest, abdomen and pelvis was performed following the standard protocol during bolus administration of intravenous contrast. RADIATION DOSE REDUCTION: This exam was performed according to the departmental dose-optimization program which includes automated exposure control, adjustment of the mA and/or kV according to patient size and/or use of iterative reconstruction technique. CONTRAST:  145mL OMNIPAQUE IOHEXOL 300 MG/ML  SOLN COMPARISON:  06/15/2022 PET-CT. 03/16/2022 CT chest, abdomen and pelvis. FINDINGS: CT CHEST FINDINGS Cardiovascular: Normal heart size. No significant pericardial effusion/thickening. Right internal jugular Port-A-Cath terminates in the lower third of the SVC. Three-vessel coronary atherosclerosis. Atherosclerotic nonaneurysmal thoracic aorta. Normal caliber pulmonary arteries. No central pulmonary emboli. Mediastinum/Nodes: Stable heterogeneous top-normal size thyroid gland. Unremarkable esophagus. No pathologically enlarged axillary, mediastinal or hilar lymph nodes. Lungs/Pleura: No pneumothorax. No pleural effusion. Status post right upper lobectomy. Severe centrilobular emphysema. New solid 0.8 cm posterior left upper lobe pulmonary nodule and new tiny 0.1 cm anterior right middle lobe solid pulmonary nodule (series 3/image 60). Musculoskeletal: No appreciable change in expansile mixed lytic and sclerotic 3.6 cm right scapular body lesion (series 3/image 58). No new focal osseous lesions. CT ABDOMEN PELVIS FINDINGS Hepatobiliary: Bulky lobulated 9.7 x 8.7 cm segment 3 left liver  heterogeneous hypoenhancing mass (series 2/image 64), significantly increased from 4.6 x 4.3 cm on 06/15/2022 PET-CT. Heterogeneous hypoenhancing posterior segment 2 left liver 2.8 x 2.4 cm mass (series 2/ image 55), increased from 2.4 x 2.0 cm. Normal gallbladder with no radiopaque cholelithiasis. No biliary ductal dilatation. Pancreas: Normal, with no mass or duct dilation. Spleen: Normal size. No mass. Adrenals/Urinary Tract: Left adrenal 2.9 x 2.4 cm nodule with density 81 HU, stable and previously characterized as an adenoma on PET-CT. No right adrenal nodule. No hydronephrosis. Subcentimeter hypodense lower right renal cortical lesion is too small to characterize and unchanged, considered benign, for which no follow-up imaging is recommended. Normal bladder. Stomach/Bowel: Normal non-distended stomach. Normal caliber small bowel with no small bowel wall thickening. Normal appendix. Normal large bowel with no diverticulosis, large bowel wall thickening or pericolonic fat stranding. Vascular/Lymphatic: Atherosclerotic abdominal aorta with 3.0 cm infrarenal abdominal aortic aneurysm. Patent portal, splenic, hepatic and renal veins. No pathologically enlarged lymph nodes in the abdomen or pelvis. Reproductive: Stable enlarged myomatous uterus with coarsely calcified degenerated uterine fibroids measuring up to 2.5 cm posteriorly. No adnexal masses. Other: No pneumoperitoneum, ascites or focal fluid collection. Small to moderate fat containing midline supraumbilical ventral abdominal hernia, unchanged. Musculoskeletal: No aggressive appearing focal osseous lesions. IMPRESSION: 1. Two new solid pulmonary nodules, largest 0.8 cm in the posterior left upper lobe, suspicious for new pulmonary metastases. 2. Two enlarging left liver metastases, including substantial growth of the dominant bulky 9.7 cm segment 3 left liver metastasis. 3. Stable expansile mixed lytic and sclerotic right scapular body metastasis. 4.  Chronic findings include: Infrarenal 3.0 cm abdominal aortic aneurysm. Recommend follow-up ultrasound every 3 years. Three-vessel coronary atherosclerosis. Stable left adrenal adenoma, for which no follow-up imaging is recommended. Stable myomatous uterus. Stable small to moderate fat containing midline supraumbilical ventral abdominal hernia. Aortic Atherosclerosis (ICD10-I70.0) and Emphysema (ICD10-J43.9). Electronically Signed   By: Ilona Sorrel M.D.   On: 11/11/2022 21:11    PERFORMANCE STATUS (ECOG) : 1 - Symptomatic but completely ambulatory  Review  of Systems Unless otherwise noted, a complete review of systems is negative.  Physical Exam General: NAD Pulmonary: Unlabored Extremities: no edema, no joint deformities Skin: no rashes Neurological: Weakness but otherwise nonfocal  IMPRESSION: CT of the chest abdomen and pelvis on 11/11/2022 showed disease progression with new pulmonary nodules, enlarging liver metastases.  Patient seen today in infusion.  Patient tearful as she described the news of cancer progression and likely life prognosis of only months.  She confirmed a desire to pursue last line lurbinectedin.  However, if poor tolerance, she recognizes that we would be transitioning to best supportive care/hospice.  Currently, she says that she is fairly symptomatically controlled.  Will plan to follow along closely and further conversations regarding goals.  Will need to readdress CODE STATUS.  PLAN: -Continue current scope of treatment -Continue OxyContin/Percocet -Follow up 3 weeks   Patient expressed understanding and was in agreement with this plan. She also understands that She can call the clinic at any time with any questions, concerns, or complaints.     Time Total: 15 minutes  Visit consisted of counseling and education dealing with the complex and emotionally intense issues of symptom management and palliative care in the setting of serious and potentially  life-threatening illness.Greater than 50%  of this time was spent counseling and coordinating care related to the above assessment and plan.  Signed by: Altha Harm, PhD, NP-C

## 2022-11-26 NOTE — Progress Notes (Signed)
Patient denies new problems/concerns today.    Pain is well controlled on current medication regimen.  Stable appetite with 2 lb wt loss since last visit.

## 2022-11-26 NOTE — Patient Instructions (Signed)
[facility]  Discharge Instructions: Thank you for choosing [facility] to provide your oncology and hematology care.  If you have a lab appointment with the [facility], please go directly to the [facility] and check in at the registration area.  Wear comfortable clothing and clothing appropriate for easy access to any Portacath or PICC line.   We strive to give you quality time with your provider. You may need to reschedule your appointment if you arrive late (15 or more minutes).  Arriving late affects you and other patients whose appointments are after yours.  Also, if you miss three or more appointments without notifying the office, you may be dismissed from the clinic at the provider's discretion.      For prescription refill requests, have your pharmacy contact our office and allow 72 hours for refills to be completed.    Today you received the following chemotherapy and/or immunotherapy agents ZEPZELCA      To help prevent nausea and vomiting after your treatment, we encourage you to take your nausea medication as directed.  BELOW ARE SYMPTOMS THAT SHOULD BE REPORTED IMMEDIATELY: *FEVER GREATER THAN 100.4 F (38 C) OR HIGHER *CHILLS OR SWEATING *NAUSEA AND VOMITING THAT IS NOT CONTROLLED WITH YOUR NAUSEA MEDICATION *UNUSUAL SHORTNESS OF BREATH *UNUSUAL BRUISING OR BLEEDING *URINARY PROBLEMS (pain or burning when urinating, or frequent urination) *BOWEL PROBLEMS (unusual diarrhea, constipation, pain near the anus) TENDERNESS IN MOUTH AND THROAT WITH OR WITHOUT PRESENCE OF ULCERS (sore throat, sores in mouth, or a toothache) UNUSUAL RASH, SWELLING OR PAIN  UNUSUAL VAGINAL DISCHARGE OR ITCHING   Items with * indicate a potential emergency and should be followed up as soon as possible or go to the Emergency Department if any problems should occur.  Please show the CHEMOTHERAPY ALERT CARD or IMMUNOTHERAPY ALERT CARD at check-in to  the Emergency Department and triage nurse.  Should you have questions after your visit or need to cancel or reschedule your appointment, please contact [facility]  [phone] and follow the prompts.  Office hours are 8:00 a.m. to 4:30 p.m. Monday - Friday. Please note that voicemails left after 4:00 p.m. may not be returned until the following business day.  We are closed weekends and major holidays. You have access to a nurse at all times for urgent questions. Please call the main number to the clinic [phone] and follow the prompts.  For any non-urgent questions, you may also contact your provider using MyChart. We now offer e-Visits for anyone 102 and older to request care online for non-urgent symptoms. For details visit [email or web address].   Also download the MyChart app! Go to the app store, search "MyChart", open the app, select [facility], and log in with your MyChart username and password.  Lurbinectedin Injection What is this medication? LURBINECTEDIN (LOOR bin EK te din) treats lung cancer. It works by slowing down the growth of cancer cells. This medicine may be used for other purposes; ask your health care provider or pharmacist if you have questions. COMMON BRAND NAME(S): ZEPZELCA What should I tell my care team before I take this medication? They need to know if you have any of these conditions: Liver disease Low blood cell levels, such as low white cells, platelets, red blood cells An unusual or allergic reaction to lurbinectedin, other medications, foods, dyes, or preservatives If you or your partner are pregnant or trying to get pregnant Breastfeeding How should  I use this medication? This medication is injected into a vein. It is given by your care team in a hospital or clinic setting. Talk to your care team about the use of this medication in children. Special care may be needed. Overdosage: If you think you have taken too much  of this medicine contact a poison control center or emergency room at once. NOTE: This medicine is only for you. Do not share this medicine with others. What if I miss a dose? Keep appointments for follow-up doses. It is important not to miss your dose. Call your care team if you are unable to keep an appointment. What may interact with this medication? Grapefruit juice or [place] oranges Other medications may affect the way this medication works. Talk with your care team about all of the medications you take. They may suggest changes to your treatment plan to lower the risk of side effects and to make sure your medications work as intended. This list may not describe all possible interactions. Give your health care provider a list of all the medicines, herbs, non-prescription drugs, or dietary supplements you use. Also tell them if you smoke, drink alcohol, or use illegal drugs. Some items may interact with your medicine. What should I watch for while using this medication? Your condition will be monitored carefully while you are receiving this medication. This medication may make you feel generally unwell. This is not uncommon as chemotherapy can affect healthy cells as well as cancer cells. Report any side effects. Continue your course of treatment even though you feel ill unless your care team tells you to stop. This medication may increase your risk of getting an infection. Call your care team for advice if you get a fever, chills, sore throat, or other symptoms of a cold or flu. Do not treat yourself. Try to avoid being around people who are sick. Avoid taking medications that contain aspirin, acetaminophen, ibuprofen, naproxen, or ketoprofen unless instructed by your care team. These medications may hide a fever. Be careful brushing or flossing your teeth or using a toothpick because you may get an infection or bleed more easily. If you have any dental work done, tell your dentist you are  receiving this medication. Talk to your care team if you may be pregnant. Serious birth defects can occur if you take this medication during pregnancy and for 6 months after the last dose. You will need a negative pregnancy test before starting this medication. Contraception is recommended while taking this medication and for 6 months after the last dose. If your partner can get pregnant, use a condom during sex while taking this medication and for 4 months after the last dose. Do not breastfeed while taking this medication and for 2 weeks after the last dose. What side effects may I notice from receiving this medication? Side effects that you should report to your care team as soon as possible: Allergic reactions--skin rash, itching, hives, swelling of the face, lips, tongue, or throat Infection--fever, chills, cough, sore throat, wounds that don't heal, pain or trouble when passing urine, general feeling of discomfort or being unwell Liver injury--right upper belly pain, loss of appetite, nausea, light-colored stool, dark yellow or brown urine, yellowing skin or eyes, unusual weakness or fatigue Low red blood cell level--unusual weakness or fatigue, dizziness, headache, trouble breathing Muscle injury--unusual weakness or fatigue, muscle pain, dark yellow or brown urine, decrease in amount of urine Painful swelling, warmth, or redness of the skin, blisters or sores  at the infusion site Unusual bruising or bleeding Side effects that usually do not require medical attention (report these to your care team if they continue or are bothersome): Constipation Cough Diarrhea Fatigue Loss of appetite Nausea This list may not describe all possible side effects. Call your doctor for medical advice about side effects. You may report side effects to FDA at 1-800-FDA-1088. Where should I keep my medication? This medication is given in a hospital or clinic. It will not be stored at home. NOTE: This sheet  is a summary. It may not cover all possible information. If you have questions about this medicine, talk to your doctor, pharmacist, or health care provider.  2023 Elsevier/Gold Standard (2021-01-21 00:00:00)

## 2022-11-26 NOTE — Patient Instructions (Signed)
[facility]  Discharge Instructions: Thank you for choosing [facility] to provide your oncology and hematology care.  If you have a lab appointment with the [facility], please go directly to the [facility] and check in at the registration area.  Wear comfortable clothing and clothing appropriate for easy access to any Portacath or PICC line.   We strive to give you quality time with your provider. You may need to reschedule your appointment if you arrive late (15 or more minutes).  Arriving late affects you and other patients whose appointments are after yours.  Also, if you miss three or more appointments without notifying the office, you may be dismissed from the clinic at the provider's discretion.      For prescription refill requests, have your pharmacy contact our office and allow 72 hours for refills to be completed.    Today you received the following chemotherapy and/or immunotherapy agents ZEPZELCA      To help prevent nausea and vomiting after your treatment, we encourage you to take your nausea medication as directed.  BELOW ARE SYMPTOMS THAT SHOULD BE REPORTED IMMEDIATELY: *FEVER GREATER THAN 100.4 F (38 C) OR HIGHER *CHILLS OR SWEATING *NAUSEA AND VOMITING THAT IS NOT CONTROLLED WITH YOUR NAUSEA MEDICATION *UNUSUAL SHORTNESS OF BREATH *UNUSUAL BRUISING OR BLEEDING *URINARY PROBLEMS (pain or burning when urinating, or frequent urination) *BOWEL PROBLEMS (unusual diarrhea, constipation, pain near the anus) TENDERNESS IN MOUTH AND THROAT WITH OR WITHOUT PRESENCE OF ULCERS (sore throat, sores in mouth, or a toothache) UNUSUAL RASH, SWELLING OR PAIN  UNUSUAL VAGINAL DISCHARGE OR ITCHING   Items with * indicate a potential emergency and should be followed up as soon as possible or go to the Emergency Department if any problems should occur.  Please show the CHEMOTHERAPY ALERT CARD or IMMUNOTHERAPY ALERT CARD at check-in to  the Emergency Department and triage nurse.  Should you have questions after your visit or need to cancel or reschedule your appointment, please contact [facility]  [phone] and follow the prompts.  Office hours are 8:00 a.m. to 4:30 p.m. Monday - Friday. Please note that voicemails left after 4:00 p.m. may not be returned until the following business day.  We are closed weekends and major holidays. You have access to a nurse at all times for urgent questions. Please call the main number to the clinic [phone] and follow the prompts.  For any non-urgent questions, you may also contact your provider using MyChart. We now offer e-Visits for anyone 50 and older to request care online for non-urgent symptoms. For details visit [email or web address].   Also download the MyChart app! Go to the app store, search "MyChart", open the app, select [facility], and log in with your MyChart username and password.   Lurbinectedin Injection What is this medication? LURBINECTEDIN (LOOR bin EK te din) treats lung cancer. It works by slowing down the growth of cancer cells. This medicine may be used for other purposes; ask your health care provider or pharmacist if you have questions. COMMON BRAND NAME(S): ZEPZELCA What should I tell my care team before I take this medication? They need to know if you have any of these conditions: Liver disease Low blood cell levels, such as low white cells, platelets, red blood cells An unusual or allergic reaction to lurbinectedin, other medications, foods, dyes, or preservatives If you or your partner are pregnant or trying to get pregnant Breastfeeding How  should I use this medication? This medication is injected into a vein. It is given by your care team in a hospital or clinic setting. Talk to your care team about the use of this medication in children. Special care may be needed. Overdosage: If you think you have taken too  much of this medicine contact a poison control center or emergency room at once. NOTE: This medicine is only for you. Do not share this medicine with others. What if I miss a dose? Keep appointments for follow-up doses. It is important not to miss your dose. Call your care team if you are unable to keep an appointment. What may interact with this medication? Grapefruit juice or [place] oranges Other medications may affect the way this medication works. Talk with your care team about all of the medications you take. They may suggest changes to your treatment plan to lower the risk of side effects and to make sure your medications work as intended. This list may not describe all possible interactions. Give your health care provider a list of all the medicines, herbs, non-prescription drugs, or dietary supplements you use. Also tell them if you smoke, drink alcohol, or use illegal drugs. Some items may interact with your medicine. What should I watch for while using this medication? Your condition will be monitored carefully while you are receiving this medication. This medication may make you feel generally unwell. This is not uncommon as chemotherapy can affect healthy cells as well as cancer cells. Report any side effects. Continue your course of treatment even though you feel ill unless your care team tells you to stop. This medication may increase your risk of getting an infection. Call your care team for advice if you get a fever, chills, sore throat, or other symptoms of a cold or flu. Do not treat yourself. Try to avoid being around people who are sick. Avoid taking medications that contain aspirin, acetaminophen, ibuprofen, naproxen, or ketoprofen unless instructed by your care team. These medications may hide a fever. Be careful brushing or flossing your teeth or using a toothpick because you may get an infection or bleed more easily. If you have any dental work done, tell your dentist you are  receiving this medication. Talk to your care team if you may be pregnant. Serious birth defects can occur if you take this medication during pregnancy and for 6 months after the last dose. You will need a negative pregnancy test before starting this medication. Contraception is recommended while taking this medication and for 6 months after the last dose. If your partner can get pregnant, use a condom during sex while taking this medication and for 4 months after the last dose. Do not breastfeed while taking this medication and for 2 weeks after the last dose. What side effects may I notice from receiving this medication? Side effects that you should report to your care team as soon as possible: Allergic reactions--skin rash, itching, hives, swelling of the face, lips, tongue, or throat Infection--fever, chills, cough, sore throat, wounds that don't heal, pain or trouble when passing urine, general feeling of discomfort or being unwell Liver injury--right upper belly pain, loss of appetite, nausea, light-colored stool, dark yellow or brown urine, yellowing skin or eyes, unusual weakness or fatigue Low red blood cell level--unusual weakness or fatigue, dizziness, headache, trouble breathing Muscle injury--unusual weakness or fatigue, muscle pain, dark yellow or brown urine, decrease in amount of urine Painful swelling, warmth, or redness of the skin, blisters or  sores at the infusion site Unusual bruising or bleeding Side effects that usually do not require medical attention (report these to your care team if they continue or are bothersome): Constipation Cough Diarrhea Fatigue Loss of appetite Nausea This list may not describe all possible side effects. Call your doctor for medical advice about side effects. You may report side effects to FDA at 1-800-FDA-1088. Where should I keep my medication? This medication is given in a hospital or clinic. It will not be stored at home. NOTE: This sheet  is a summary. It may not cover all possible information. If you have questions about this medicine, talk to your doctor, pharmacist, or health care provider.  2023 Elsevier/Gold Standard (2021-01-21 00:00:00)

## 2022-11-30 ENCOUNTER — Other Ambulatory Visit: Payer: Self-pay

## 2022-12-03 ENCOUNTER — Inpatient Hospital Stay: Payer: Medicaid Other

## 2022-12-03 ENCOUNTER — Inpatient Hospital Stay (HOSPITAL_BASED_OUTPATIENT_CLINIC_OR_DEPARTMENT_OTHER): Payer: Medicaid Other | Admitting: Medical Oncology

## 2022-12-03 ENCOUNTER — Encounter: Payer: Self-pay | Admitting: Medical Oncology

## 2022-12-03 ENCOUNTER — Other Ambulatory Visit: Payer: Self-pay

## 2022-12-03 VITALS — BP 113/73 | HR 86 | Temp 96.9°F | Resp 19 | Wt 139.9 lb

## 2022-12-03 DIAGNOSIS — T451X5A Adverse effect of antineoplastic and immunosuppressive drugs, initial encounter: Secondary | ICD-10-CM

## 2022-12-03 DIAGNOSIS — C3411 Malignant neoplasm of upper lobe, right bronchus or lung: Secondary | ICD-10-CM

## 2022-12-03 DIAGNOSIS — Z5111 Encounter for antineoplastic chemotherapy: Secondary | ICD-10-CM | POA: Diagnosis not present

## 2022-12-03 DIAGNOSIS — D701 Agranulocytosis secondary to cancer chemotherapy: Secondary | ICD-10-CM

## 2022-12-03 DIAGNOSIS — R300 Dysuria: Secondary | ICD-10-CM

## 2022-12-03 LAB — CBC WITH DIFFERENTIAL (CANCER CENTER ONLY)
Abs Immature Granulocytes: 0.04 10*3/uL (ref 0.00–0.07)
Basophils Absolute: 0 10*3/uL (ref 0.0–0.1)
Basophils Relative: 1 %
Eosinophils Absolute: 0 10*3/uL (ref 0.0–0.5)
Eosinophils Relative: 1 %
HCT: 35.7 % — ABNORMAL LOW (ref 36.0–46.0)
Hemoglobin: 11.6 g/dL — ABNORMAL LOW (ref 12.0–15.0)
Immature Granulocytes: 1 %
Lymphocytes Relative: 32 %
Lymphs Abs: 1.1 10*3/uL (ref 0.7–4.0)
MCH: 31.9 pg (ref 26.0–34.0)
MCHC: 32.5 g/dL (ref 30.0–36.0)
MCV: 98.1 fL (ref 80.0–100.0)
Monocytes Absolute: 0.1 10*3/uL (ref 0.1–1.0)
Monocytes Relative: 2 %
Neutro Abs: 2.2 10*3/uL (ref 1.7–7.7)
Neutrophils Relative %: 63 %
Platelet Count: 175 10*3/uL (ref 150–400)
RBC: 3.64 MIL/uL — ABNORMAL LOW (ref 3.87–5.11)
RDW: 16.9 % — ABNORMAL HIGH (ref 11.5–15.5)
WBC Count: 3.4 10*3/uL — ABNORMAL LOW (ref 4.0–10.5)
nRBC: 0 % (ref 0.0–0.2)

## 2022-12-03 LAB — URINALYSIS, COMPLETE (UACMP) WITH MICROSCOPIC
Bilirubin Urine: NEGATIVE
Glucose, UA: NEGATIVE mg/dL
Hgb urine dipstick: NEGATIVE
Ketones, ur: 5 mg/dL — AB
Leukocytes,Ua: NEGATIVE
Nitrite: NEGATIVE
Protein, ur: 100 mg/dL — AB
Specific Gravity, Urine: 1.028 (ref 1.005–1.030)
pH: 5 (ref 5.0–8.0)

## 2022-12-03 LAB — CMP (CANCER CENTER ONLY)
ALT: 17 U/L (ref 0–44)
AST: 29 U/L (ref 15–41)
Albumin: 3.7 g/dL (ref 3.5–5.0)
Alkaline Phosphatase: 104 U/L (ref 38–126)
Anion gap: 11 (ref 5–15)
BUN: 10 mg/dL (ref 8–23)
CO2: 23 mmol/L (ref 22–32)
Calcium: 9.2 mg/dL (ref 8.9–10.3)
Chloride: 102 mmol/L (ref 98–111)
Creatinine: 0.62 mg/dL (ref 0.44–1.00)
GFR, Estimated: >60 mL/min (ref >60)
Glucose, Bld: 114 mg/dL — ABNORMAL HIGH (ref 70–99)
Potassium: 3.7 mmol/L (ref 3.5–5.1)
Sodium: 136 mmol/L (ref 135–145)
Total Bilirubin: 0.5 mg/dL (ref 0.3–1.2)
Total Protein: 7.6 g/dL (ref 6.5–8.1)

## 2022-12-03 LAB — LACTATE DEHYDROGENASE: LDH: 208 U/L — ABNORMAL HIGH (ref 98–192)

## 2022-12-03 MED ORDER — SODIUM CHLORIDE 0.9% FLUSH
10.0000 mL | Freq: Once | INTRAVENOUS | Status: DC
  Filled 2022-12-03: qty 10

## 2022-12-03 MED ORDER — HEPARIN SOD (PORK) LOCK FLUSH 100 UNIT/ML IV SOLN
500.0000 [IU] | Freq: Once | INTRAVENOUS | Status: DC
  Filled 2022-12-03: qty 5

## 2022-12-03 NOTE — Progress Notes (Unsigned)
Pt arrived to clinic without EMLA cream on port. Refused to let staff access port without it being numb. Pt sent to lab for peripheral draw

## 2022-12-03 NOTE — Progress Notes (Signed)
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 female who presents today for:  Consideration of IVF and bloodwork following first cycle of Lurbinectedin which she had on 11/26/2022. Today she reports that she is feeling ok. Chronic fatigue is present but not worsened. She denies any nausea, vomiting, diarrhea or constipation. She has noticed a bit of bladder pressure and some mild dysuria over the past few weeks. No hematuria, lower abdominal pain, flank pain, fever. She has not taken anything for symptoms.    Denies any neurologic complaints. Denies recent fevers or illnesses. Denies any easy bleeding or bruising. Reports good appetite but weight is down by 1 pound. Denies chest pain. Denies any nausea, vomiting, constipation, or diarrhea. Patient offers no further specific complaints today.  Wt Readings from Last 3 Encounters:  12/03/22 139 lb 14.4 oz (63.5 kg)  11/26/22 140 lb 9.6 oz (63.8 kg)  11/17/22 142 lb (64.4 kg)     PAST MEDICAL HISTORY: Past Medical History:  Diagnosis Date   Cancer ([place])    lung cancer   Depression    Duodenitis    Dyspnea    Family history of cancer    High cholesterol    Personal history of colonic polyps    Pre-diabetes    Umbilical hernia    UTI (urinary tract infection)     PAST SURGICAL HISTORY:  Past Surgical History:  Procedure Laterality Date   COLONOSCOPY WITH PROPOFOL N/A 03/24/2021   Procedure: COLONOSCOPY WITH PROPOFOL;  Surgeon:  Jonathon Bellows, MD;  Location: [facility] ENDOSCOPY;  Service: Gastroenterology;  Laterality: N/A;   INTERCOSTAL NERVE BLOCK  09/19/2020   Procedure: INTERCOSTAL NERVE BLOCK;  Surgeon: Melrose Nakayama, MD;  Location: [facility];  Service: Thoracic;;   IR IMAGING GUIDED PORT INSERTION  09/23/2021   LUNG LOBECTOMY Right    LUNG REMOVAL, PARTIAL Right 09/19/2020   NODE DISSECTION Right 09/19/2020   Procedure: NODE DISSECTION;  Surgeon: Melrose Nakayama, MD;  Location: [facility];  Service: Thoracic;  Laterality: Right;   VIDEO BRONCHOSCOPY N/A 07/08/2021   Procedure: VIDEO BRONCHOSCOPY;  Surgeon: Melrose Nakayama, MD;  Location: [facility];  Service: Thoracic;  Laterality: N/A;    HEMATOLOGY/ONCOLOGY HISTORY:  Oncology History Overview Note  # NOV [id number] [LUNG CANCER SCREENING PROGRAM]-18 mm right upper lobe lung nodule; [place] 2021- s/p right upper lobectomy [Dr. Roxan Hockey; [place]]; STAGE: I [pT-18 mm; LN-12=0]; predominant large cell neuroendocrine; minor adenocarcinoma.  Declines adjuvant chemotherapy.  #Recurrent/stage IV cancer NOV 2022- PET scan-scapular lesion liver lesion gastrohepatic lymphadenopathy.  11/21- start RT to right scapular lesion  # DEC 3rd, 2022- CARBO=ETOP+TECEN; udenyca #1; SEP 2023- STOP Tencetriq- progression  SEP 2023-liver biopsy shows neuroendocrine carcinoma/large cell; no adenocarcinoma noted.  Discontinue Tecentriq any progression of disease.  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   # OCTOBER, 2023-  # # MAY 2023- Right swollen eye/orbital inflammation/uveitis s/p Zometa status post steroids improved.-reviewed literature case reports noted; less concerning for tumor involvement.  DISCONTINUE ZOMETA.  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  Visceral Pleura Invasion: Not identified  Direct Invasion of Adjacent Structures: No adjacent structures present  Lymphovascular Invasion: Not identified  Margins: All margins negative for invasive carcinoma       Closest Margin(s) to Invasive Carcinoma: Bronchovascular margin  Treatment Effect: No known presurgical therapy  Regional Lymph Nodes:       Number of Lymph Nodes Involved: 0                            Nodal Sites with Tumor: Not applicable       Number of Lymph Nodes Examined: 12      Primary cancer of right upper lobe of lung ([place])  11/03/2020 Initial Diagnosis   Primary cancer of right upper lobe of lung ([place])   11/03/2020 Cancer Staging   Staging form: Lung, AJCC 8th Edition - Pathologic: Stage IA3 (pT1c, pN0, cM0) - Signed by Cammie Sickle, MD on 11/04/2020   09/14/2021 - 04/30/2022 Chemotherapy   Patient is on Treatment Plan : LUNG SCLC Carboplatin + Etoposide + Atezolizumab Induction q21d / Atezolizumab Maintenance q21d     09/15/2021 - 06/18/2022 Chemotherapy   Patient is on Treatment Plan : LUNG SCLC Carboplatin + Etoposide + Atezolizumab Induction q21d x 4 cycles / Atezolizumab Maintenance q21d     11/09/2021 Cancer Staging   Staging form: Lung, AJCC 8th Edition - Pathologic: Stage IVB (pTX, pNX, cM1c) - Signed by Cammie Sickle, MD on 11/09/2021   06/22/2022 Pathology Results   Liver biopsy showed metastatic cancer with neuroendocrine features    08/09/2022 - 10/22/2022 Chemotherapy   Patient is on Treatment Plan : BRAIN Carboplatin (AUC 5) D1 + Etoposide (120) D1-3 q28d     11/26/2022 -  Chemotherapy   Patient is on Treatment Plan : LUNG SMALL CELL Lurbinectedin q21d       ALLERGIES:  has No Known Allergies.  MEDICATIONS:  Current Outpatient Medications  Medication Sig Dispense Refill   albuterol  (PROVENTIL HFA) 108 (90 Base) MCG/ACT inhaler Inhale 2 puffs into the lungs once every 6 (six) hours as needed for wheezing or shortness of breath. 18 g 2   lidocaine-prilocaine (EMLA) cream Apply one application topically the the affected area(s) daily as needed. 30 g 3   loratadine (CLARITIN) 10 MG tablet Take 1 tablet (10 mg total) by mouth daily. 30 tablet 2   metFORMIN (GLUCOPHAGE) 500 MG tablet Take 500 mg by mouth daily with breakfast.     naloxone (NARCAN) nasal spray 4 mg/0.1 mL SPRAY 1 SPRAY INTO ONE NOSTRIL AS DIRECTED FOR OPIOID OVERDOSE (TURN PERSON ON SIDE AFTER DOSE. IF NO RESPONSE IN 2-3 MINUTES OR PERSON RESPONDS BUT RELAPSES, REPEAT USING A NEW SPRAY DEVICE AND SPRAY INTO THE OTHER NOSTRIL. CALL 911 AFTER USE.) * EMERGENCY USE ONLY * 2 each 0   ondansetron (ZOFRAN) 4 MG tablet TAKE 2 TABLETS ('8MG'$ ) BY MOUTH ONCE EVERY 8 HOURS AS NEEDED FOR NAUSEA OR VOMITING. 80 tablet 1   oxyCODONE (OXYCONTIN) 20 mg 12 hr tablet Take 1 tablet (20 mg total) by mouth every 12 (twelve)  hours. 60 tablet 0   oxyCODONE-acetaminophen (PERCOCET) 10-325 MG tablet Take 1-2 tablets by mouth every 4 (four) hours as needed for pain. 60 tablet 0   prochlorperazine (COMPAZINE) 10 MG tablet Take 1 tablet (10 mg total) by mouth once every 6 (six) hours as needed for nausea or vomiting. 40 tablet 1   DULoxetine (CYMBALTA) 30 MG capsule Take 1 capsule (30 mg total) by mouth daily. (Patient not taking: Reported on 09/06/2022) 30 capsule 3   lidocaine (LIDODERM) 5 % Place 1 patch onto the skin daily. Remove & Discard patch within 12 hours or as directed by MD (Patient not taking: Reported on 06/29/2022) 30 patch 2   predniSONE (DELTASONE) 20 MG tablet Take 1 tablet (20 mg total) by mouth daily with breakfast. Once a day with food. (Patient not taking: Reported on 09/06/2022) 30 tablet 0   No current facility-administered medications for this visit.   Facility-Administered Medications Ordered in Other Visits  Medication  Dose Route Frequency Provider Last Rate Last Admin   heparin lock flush 100 UNIT/ML injection            heparin lock flush 100 unit/mL  500 Units Intravenous Once Charlaine Dalton R, MD       morphine (PF) 2 MG/ML injection            sodium chloride flush (NS) 0.9 % injection 10 mL  10 mL Intravenous PRN Cammie Sickle, MD   10 mL at 03/01/22 0857    VITAL SIGNS: BP 113/73   Pulse 86   Temp (!) 96.9 F (36.1 C)   Resp 19   Wt 139 lb 14.4 oz (63.5 kg)   SpO2 99%   BMI 21.91 kg/m  Filed Weights   12/03/22 0942  Weight: 139 lb 14.4 oz (63.5 kg)    Estimated body mass index is 21.91 kg/m as calculated from the following:   Height as of 09/06/22: '5\' 7"'$  (1.702 m).   Weight as of this encounter: 139 lb 14.4 oz (63.5 kg).  LABS: CBC:    Component Value Date/Time   WBC 3.4 (L) 12/03/2022 0925   WBC 9.9 11/26/2022 0904   HGB 11.6 (L) 12/03/2022 0925   HGB 15.5 11/26/2020 1214   HCT 35.7 (L) 12/03/2022 0925   HCT 47.6 (H) 11/26/2020 1214   PLT 175 12/03/2022 0925   PLT 317 11/26/2020 1214   MCV 98.1 12/03/2022 0925   MCV 87 11/26/2020 1214   NEUTROABS PENDING 12/03/2022 0925   NEUTROABS 2.5 11/26/2020 1214   LYMPHSABS PENDING 12/03/2022 0925   LYMPHSABS 3.6 (H) 11/26/2020 1214   MONOABS PENDING 12/03/2022 0925   EOSABS PENDING 12/03/2022 0925   EOSABS 0.2 11/26/2020 1214   BASOSABS PENDING 12/03/2022 0925   BASOSABS 0.1 11/26/2020 1214   Comprehensive Metabolic Panel:    Component Value Date/Time   NA 136 12/03/2022 0925   NA 139 11/26/2020 1214   K 3.7 12/03/2022 0925   CL 102 12/03/2022 0925   CO2 23 12/03/2022 0925   BUN 10 12/03/2022 0925   BUN 8 11/26/2020 1214   CREATININE 0.62 12/03/2022 0925   GLUCOSE 114 (H) 12/03/2022 0925   CALCIUM 9.2 12/03/2022 0925   AST 29 12/03/2022 0925   ALT 17 12/03/2022 0925   ALKPHOS 104 12/03/2022 0925   BILITOT 0.5 12/03/2022 0925   PROT 7.6 12/03/2022 0925   PROT 6.9 11/26/2020 1214   ALBUMIN 3.7 12/03/2022  0925   ALBUMIN 4.0 11/26/2020 1214  PERFORMANCE STATUS (ECOG) : 2 - Symptomatic, <50% confined to bed  Review of Systems Unless otherwise noted, a complete review of systems is negative.  Physical Exam General: NAD. No jaundice  Cardiovascular: regular rate and rhythm Pulmonary: clear ant fields Abdomen: soft, nontender, + bowel sounds, no CVA tenderness GU: no suprapubic tenderness Extremities: no edema, no joint deformities Skin: no rashes Neurological: Weakness but otherwise nonfocal  Assessment and Plan- Patient is a 62 y.o. female    Encounter Diagnoses  Name Primary?   Dysuria Yes   Primary cancer of right upper lobe of lung (HCC)    Chemotherapy-induced neutropenia (HCC)     Dysuria: New. Given that she is on active treatment we will check a UA/Culture and see if ABX is needed for potential UTI.   Neutropenia: Acute on chronic. Mild. Secondary to her active treatment. She is afebrile. WBC is 3.4, ANC is 2.2.   Lung Cancer: So far she appears to be tolerating the Zepzelca well with mild neutropenia- common side effect in this medication. Should improve over the next 2 weeks prior to cycle 2. Hoping that this medication can give her some response. She has follow up in 2 weeks. Precautions discussed.   Hyponatremia: Seen on last visit but has resolved with better oral intake. No IVF needed today. Will continue to monitor.   *update: UA does not appear to be a clean catch- no leukocytes or nitrites. Has some proteinuria - I would recommend a repeat UA at next visit. Culture pending.    Disposition No IVF today Urine/Urineculture RTC as previously planned.    Patient expressed understanding and was in agreement with this plan. She also understands that She can call clinic at any time with any questions, concerns, or complaints.   Thank you for allowing me to participate in the care of this very pleasant patient.   Time Total: 25  Visit consisted of counseling  and education dealing with the complex and emotionally intense issues of symptom management in the setting of serious illness.Greater than 50%  of this time was spent counseling and coordinating care related to the above assessment and plan.  Signed by: Nelwyn Salisbury, PA-C

## 2022-12-05 LAB — URINE CULTURE: Culture: >=100000 — AB

## 2022-12-06 ENCOUNTER — Telehealth: Payer: Self-pay | Admitting: Medical Oncology

## 2022-12-06 ENCOUNTER — Other Ambulatory Visit: Payer: Self-pay | Admitting: Medical Oncology

## 2022-12-06 ENCOUNTER — Other Ambulatory Visit: Payer: Self-pay

## 2022-12-06 MED ORDER — SULFAMETHOXAZOLE-TRIMETHOPRIM 800-160 MG PO TABS
1.0000 | ORAL_TABLET | Freq: Two times a day (BID) | ORAL | 0 refills | Status: AC
  Filled 2022-12-06: qty 10, 5d supply, fill #0

## 2022-12-06 NOTE — Telephone Encounter (Signed)
We discussed her urine culture results. Sending in bactrim to the pharmacy for her. Discussed how to take along with red flags. Confirmed pharmacy. She will alert [place] if symptoms worse or fail to resolve.

## 2022-12-08 ENCOUNTER — Other Ambulatory Visit: Payer: Self-pay | Admitting: *Deleted

## 2022-12-08 ENCOUNTER — Other Ambulatory Visit: Payer: Self-pay

## 2022-12-08 DIAGNOSIS — R0602 Shortness of breath: Secondary | ICD-10-CM

## 2022-12-08 MED ORDER — OXYCODONE HCL ER 20 MG PO T12A
20.0000 mg | EXTENDED_RELEASE_TABLET | Freq: Two times a day (BID) | ORAL | 0 refills | Status: DC
  Filled 2022-12-08: qty 60, 30d supply, fill #0

## 2022-12-08 MED ORDER — OXYCODONE-ACETAMINOPHEN 10-325 MG PO TABS
1.0000 | ORAL_TABLET | ORAL | 0 refills | Status: DC | PRN
  Filled 2022-12-08: qty 60, 5d supply, fill #0

## 2022-12-08 MED ORDER — ALBUTEROL SULFATE HFA 108 (90 BASE) MCG/ACT IN AERS
2.0000 | INHALATION_SPRAY | Freq: Four times a day (QID) | RESPIRATORY_TRACT | 2 refills | Status: DC | PRN
  Filled 2022-12-08: qty 18, 25d supply, fill #0

## 2022-12-09 ENCOUNTER — Other Ambulatory Visit: Payer: Self-pay

## 2022-12-16 MED FILL — Dexamethasone Sodium Phosphate Inj 100 MG/10ML: INTRAMUSCULAR | Qty: 1 | Status: AC

## 2022-12-17 ENCOUNTER — Inpatient Hospital Stay (HOSPITAL_BASED_OUTPATIENT_CLINIC_OR_DEPARTMENT_OTHER): Payer: Medicaid Other | Admitting: Hospice and Palliative Medicine

## 2022-12-17 ENCOUNTER — Encounter: Payer: Self-pay | Admitting: Internal Medicine

## 2022-12-17 ENCOUNTER — Other Ambulatory Visit: Payer: Self-pay

## 2022-12-17 ENCOUNTER — Inpatient Hospital Stay (HOSPITAL_BASED_OUTPATIENT_CLINIC_OR_DEPARTMENT_OTHER): Payer: Medicaid Other | Admitting: Internal Medicine

## 2022-12-17 ENCOUNTER — Inpatient Hospital Stay: Payer: Medicaid Other

## 2022-12-17 ENCOUNTER — Inpatient Hospital Stay: Payer: Medicaid Other | Attending: Radiation Oncology

## 2022-12-17 VITALS — BP 114/70 | HR 73 | Temp 99.4°F | Resp 16 | Wt 141.5 lb

## 2022-12-17 DIAGNOSIS — C7951 Secondary malignant neoplasm of bone: Secondary | ICD-10-CM | POA: Diagnosis not present

## 2022-12-17 DIAGNOSIS — Z7984 Long term (current) use of oral hypoglycemic drugs: Secondary | ICD-10-CM | POA: Diagnosis not present

## 2022-12-17 DIAGNOSIS — J449 Chronic obstructive pulmonary disease, unspecified: Secondary | ICD-10-CM | POA: Diagnosis not present

## 2022-12-17 DIAGNOSIS — C3411 Malignant neoplasm of upper lobe, right bronchus or lung: Secondary | ICD-10-CM

## 2022-12-17 DIAGNOSIS — E876 Hypokalemia: Secondary | ICD-10-CM | POA: Insufficient documentation

## 2022-12-17 DIAGNOSIS — E119 Type 2 diabetes mellitus without complications: Secondary | ICD-10-CM | POA: Insufficient documentation

## 2022-12-17 DIAGNOSIS — K5900 Constipation, unspecified: Secondary | ICD-10-CM | POA: Diagnosis not present

## 2022-12-17 DIAGNOSIS — Z79899 Other long term (current) drug therapy: Secondary | ICD-10-CM | POA: Diagnosis not present

## 2022-12-17 DIAGNOSIS — C787 Secondary malignant neoplasm of liver and intrahepatic bile duct: Secondary | ICD-10-CM | POA: Diagnosis not present

## 2022-12-17 DIAGNOSIS — Z7952 Long term (current) use of systemic steroids: Secondary | ICD-10-CM | POA: Insufficient documentation

## 2022-12-17 DIAGNOSIS — Z9221 Personal history of antineoplastic chemotherapy: Secondary | ICD-10-CM | POA: Diagnosis not present

## 2022-12-17 DIAGNOSIS — Z5111 Encounter for antineoplastic chemotherapy: Secondary | ICD-10-CM | POA: Insufficient documentation

## 2022-12-17 LAB — COMPREHENSIVE METABOLIC PANEL
ALT: 8 U/L (ref 0–44)
AST: 15 U/L (ref 15–41)
Albumin: 3.2 g/dL — ABNORMAL LOW (ref 3.5–5.0)
Alkaline Phosphatase: 80 U/L (ref 38–126)
Anion gap: 8 (ref 5–15)
BUN: 7 mg/dL — ABNORMAL LOW (ref 8–23)
CO2: 25 mmol/L (ref 22–32)
Calcium: 8.8 mg/dL — ABNORMAL LOW (ref 8.9–10.3)
Chloride: 105 mmol/L (ref 98–111)
Creatinine, Ser: 0.45 mg/dL (ref 0.44–1.00)
GFR, Estimated: >60 mL/min (ref >60)
Glucose, Bld: 97 mg/dL (ref 70–99)
Potassium: 3.3 mmol/L — ABNORMAL LOW (ref 3.5–5.1)
Sodium: 138 mmol/L (ref 135–145)
Total Bilirubin: 0.3 mg/dL (ref 0.3–1.2)
Total Protein: 6.9 g/dL (ref 6.5–8.1)

## 2022-12-17 LAB — CBC WITH DIFFERENTIAL/PLATELET
Abs Immature Granulocytes: 0.01 10*3/uL (ref 0.00–0.07)
Basophils Absolute: 0 10*3/uL (ref 0.0–0.1)
Basophils Relative: 0 %
Eosinophils Absolute: 0 10*3/uL (ref 0.0–0.5)
Eosinophils Relative: 0 %
HCT: 30.9 % — ABNORMAL LOW (ref 36.0–46.0)
Hemoglobin: 10.2 g/dL — ABNORMAL LOW (ref 12.0–15.0)
Immature Granulocytes: 0 %
Lymphocytes Relative: 45 %
Lymphs Abs: 1.2 10*3/uL (ref 0.7–4.0)
MCH: 32.3 pg (ref 26.0–34.0)
MCHC: 33 g/dL (ref 30.0–36.0)
MCV: 97.8 fL (ref 80.0–100.0)
Monocytes Absolute: 0.4 10*3/uL (ref 0.1–1.0)
Monocytes Relative: 15 %
Neutro Abs: 1.1 10*3/uL — ABNORMAL LOW (ref 1.7–7.7)
Neutrophils Relative %: 40 %
Platelets: 275 10*3/uL (ref 150–400)
RBC: 3.16 MIL/uL — ABNORMAL LOW (ref 3.87–5.11)
RDW: 16.4 % — ABNORMAL HIGH (ref 11.5–15.5)
WBC: 2.8 10*3/uL — ABNORMAL LOW (ref 4.0–10.5)
nRBC: 0 % (ref 0.0–0.2)

## 2022-12-17 MED ORDER — SODIUM CHLORIDE 0.9 % IV SOLN
Freq: Once | INTRAVENOUS | Status: AC
  Filled 2022-12-17: qty 250

## 2022-12-17 MED ORDER — POTASSIUM CHLORIDE CRYS ER 20 MEQ PO TBCR
20.0000 meq | EXTENDED_RELEASE_TABLET | Freq: Two times a day (BID) | ORAL | 3 refills | Status: DC
  Filled 2022-12-17: qty 60, 30d supply, fill #0

## 2022-12-17 MED ORDER — SODIUM CHLORIDE 0.9 % IV SOLN
10.0000 mg | Freq: Once | INTRAVENOUS | Status: AC
  Administered 2022-12-17: 10 mg via INTRAVENOUS
  Filled 2022-12-17: qty 10

## 2022-12-17 MED ORDER — HEPARIN SOD (PORK) LOCK FLUSH 100 UNIT/ML IV SOLN
500.0000 [IU] | Freq: Once | INTRAVENOUS | Status: AC | PRN
  Administered 2022-12-17: 500 [IU]
  Filled 2022-12-17: qty 5

## 2022-12-17 MED ORDER — SODIUM CHLORIDE 0.9 % IV SOLN
3.2000 mg/m2 | Freq: Once | INTRAVENOUS | Status: AC
  Administered 2022-12-17: 5.55 mg via INTRAVENOUS
  Filled 2022-12-17: qty 11.1

## 2022-12-17 MED ORDER — SODIUM CHLORIDE 0.9% FLUSH
10.0000 mL | INTRAVENOUS | Status: DC | PRN
  Administered 2022-12-17: 10 mL
  Filled 2022-12-17: qty 10

## 2022-12-17 MED ORDER — PALONOSETRON HCL INJECTION 0.25 MG/5ML
0.2500 mg | Freq: Once | INTRAVENOUS | Status: AC
  Administered 2022-12-17: 0.25 mg via INTRAVENOUS
  Filled 2022-12-17: qty 5

## 2022-12-17 NOTE — Patient Instructions (Signed)
[facility]  Discharge Instructions: Thank you for choosing [facility] to provide your oncology and hematology care.  If you have a lab appointment with the Cancer Center, please go directly to the Cancer Center and check in at the registration area.  Wear comfortable clothing and clothing appropriate for easy access to any Portacath or PICC line.   We strive to give you quality time with your provider. You may need to reschedule your appointment if you arrive late (15 or more minutes).  Arriving late affects you and other patients whose appointments are after yours.  Also, if you miss three or more appointments without notifying the office, you may be dismissed from the clinic at the provider's discretion.      For prescription refill requests, have your pharmacy contact our office and allow 72 hours for refills to be completed.    Today you received the following chemotherapy and/or immunotherapy agents ZEPZELCA      To help prevent nausea and vomiting after your treatment, we encourage you to take your nausea medication as directed.  BELOW ARE SYMPTOMS THAT SHOULD BE REPORTED IMMEDIATELY: *FEVER GREATER THAN 100.4 F (38 C) OR HIGHER *CHILLS OR SWEATING *NAUSEA AND VOMITING THAT IS NOT CONTROLLED WITH YOUR NAUSEA MEDICATION *UNUSUAL SHORTNESS OF BREATH *UNUSUAL BRUISING OR BLEEDING *URINARY PROBLEMS (pain or burning when urinating, or frequent urination) *BOWEL PROBLEMS (unusual diarrhea, constipation, pain near the anus) TENDERNESS IN MOUTH AND THROAT WITH OR WITHOUT PRESENCE OF ULCERS (sore throat, sores in mouth, or a toothache) UNUSUAL RASH, SWELLING OR PAIN  UNUSUAL VAGINAL DISCHARGE OR ITCHING   Items with * indicate a potential emergency and should be followed up as soon as possible or go to the Emergency Department if any problems should occur.  Please show the CHEMOTHERAPY ALERT CARD or IMMUNOTHERAPY ALERT CARD at check-in to  the Emergency Department and triage nurse.  Should you have questions after your visit or need to cancel or reschedule your appointment, please contact [facility]  [phone] and follow the prompts.  Office hours are 8:00 a.m. to 4:30 p.m. Monday - Friday. Please note that voicemails left after 4:00 p.m. may not be returned until the following business day.  We are closed weekends and major holidays. You have access to a nurse at all times for urgent questions. Please call the main number to the clinic [phone] and follow the prompts.  For any non-urgent questions, you may also contact your provider using MyChart. We now offer e-Visits for anyone 18 and older to request care online for non-urgent symptoms. For details visit [email or web address].   Also download the MyChart app! Go to the app store, search "MyChart", open the app, select [facility], and log in with your MyChart username and password.   Lurbinectedin Injection What is this medication? LURBINECTEDIN (LOOR bin EK te din) treats lung cancer. It works by slowing down the growth of cancer cells. This medicine may be used for other purposes; ask your health care provider or pharmacist if you have questions. COMMON BRAND NAME(S): ZEPZELCA What should I tell my care team before I take this medication? They need to know if you have any of these conditions: Liver disease Low blood cell levels, such as low white cells, platelets, red blood cells An unusual or allergic reaction to lurbinectedin, other medications, foods, dyes, or preservatives If you or your partner are pregnant or trying to get pregnant Breastfeeding How   should I use this medication? This medication is injected into a vein. It is given by your care team in a hospital or clinic setting. Talk to your care team about the use of this medication in children. Special care may be needed. Overdosage: If you think you have taken too  much of this medicine contact a poison control center or emergency room at once. NOTE: This medicine is only for you. Do not share this medicine with others. What if I miss a dose? Keep appointments for follow-up doses. It is important not to miss your dose. Call your care team if you are unable to keep an appointment. What may interact with this medication? Grapefruit juice or [place] oranges Other medications may affect the way this medication works. Talk with your care team about all of the medications you take. They may suggest changes to your treatment plan to lower the risk of side effects and to make sure your medications work as intended. This list may not describe all possible interactions. Give your health care provider a list of all the medicines, herbs, non-prescription drugs, or dietary supplements you use. Also tell them if you smoke, drink alcohol, or use illegal drugs. Some items may interact with your medicine. What should I watch for while using this medication? Your condition will be monitored carefully while you are receiving this medication. This medication may make you feel generally unwell. This is not uncommon as chemotherapy can affect healthy cells as well as cancer cells. Report any side effects. Continue your course of treatment even though you feel ill unless your care team tells you to stop. This medication may increase your risk of getting an infection. Call your care team for advice if you get a fever, chills, sore throat, or other symptoms of a cold or flu. Do not treat yourself. Try to avoid being around people who are sick. Avoid taking medications that contain aspirin, acetaminophen, ibuprofen, naproxen, or ketoprofen unless instructed by your care team. These medications may hide a fever. Be careful brushing or flossing your teeth or using a toothpick because you may get an infection or bleed more easily. If you have any dental work done, tell your dentist you are  receiving this medication. Talk to your care team if you may be pregnant. Serious birth defects can occur if you take this medication during pregnancy and for 6 months after the last dose. You will need a negative pregnancy test before starting this medication. Contraception is recommended while taking this medication and for 6 months after the last dose. If your partner can get pregnant, use a condom during sex while taking this medication and for 4 months after the last dose. Do not breastfeed while taking this medication and for 2 weeks after the last dose. What side effects may I notice from receiving this medication? Side effects that you should report to your care team as soon as possible: Allergic reactions--skin rash, itching, hives, swelling of the face, lips, tongue, or throat Infection--fever, chills, cough, sore throat, wounds that don't heal, pain or trouble when passing urine, general feeling of discomfort or being unwell Liver injury--right upper belly pain, loss of appetite, nausea, light-colored stool, dark yellow or brown urine, yellowing skin or eyes, unusual weakness or fatigue Low red blood cell level--unusual weakness or fatigue, dizziness, headache, trouble breathing Muscle injury--unusual weakness or fatigue, muscle pain, dark yellow or brown urine, decrease in amount of urine Painful swelling, warmth, or redness of the skin, blisters or   sores at the infusion site Unusual bruising or bleeding Side effects that usually do not require medical attention (report these to your care team if they continue or are bothersome): Constipation Cough Diarrhea Fatigue Loss of appetite Nausea This list may not describe all possible side effects. Call your doctor for medical advice about side effects. You may report side effects to FDA at 1-800-FDA-1088. Where should I keep my medication? This medication is given in a hospital or clinic. It will not be stored at home. NOTE: This sheet  is a summary. It may not cover all possible information. If you have questions about this medicine, talk to your doctor, pharmacist, or health care provider.  2023 Elsevier/Gold Standard (2021-01-21 00:00:00)    
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
## 2022-12-17 NOTE — Progress Notes (Signed)
[facility]  [phone] [phone]   Name: Stacie Kennedy Date: 12/17/2022 MRN: [id number]  DOB: 1961/03/18  Patient Care Team: Cammie Sickle, MD as PCP - General (Internal Medicine) Telford Nab, RN as Oncology Nurse Navigator    REASON FOR CONSULTATION: Stacie Kennedy is a 62 y.o. 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  Patient has had severe pain with scapular metastases and was started on XRT.  She is referred to palliative care to help address goals and manage ongoing symptoms.   SOCIAL HISTORY:     reports that she has been smoking cigarettes. She has a 10.75 pack-year smoking history. She has never used smokeless tobacco. She reports current alcohol use of about 2.0 standard drinks of alcohol per week. She reports that she does not use drugs.  Patient lives at home with her boyfriend.  ADVANCE DIRECTIVES:  Does not have  CODE STATUS:   PAST MEDICAL HISTORY: Past Medical History:  Diagnosis Date   Cancer ([place])    lung cancer   Depression    Duodenitis    Dyspnea    Family history of cancer    High cholesterol    Personal history of colonic polyps    Pre-diabetes    Umbilical hernia    UTI (urinary tract infection)     PAST SURGICAL HISTORY:  Past Surgical History:  Procedure Laterality Date   COLONOSCOPY WITH PROPOFOL N/A 03/24/2021   Procedure: COLONOSCOPY WITH PROPOFOL;  Surgeon: Jonathon Bellows, MD;  Location: [facility] ENDOSCOPY;  Service: Gastroenterology;  Laterality: N/A;   INTERCOSTAL NERVE BLOCK  09/19/2020   Procedure: INTERCOSTAL NERVE BLOCK;  Surgeon: Melrose Nakayama, MD;  Location: [facility];  Service: Thoracic;;   IR IMAGING GUIDED PORT INSERTION  09/23/2021   LUNG LOBECTOMY Right     LUNG REMOVAL, PARTIAL Right 09/19/2020   NODE DISSECTION Right 09/19/2020   Procedure: NODE DISSECTION;  Surgeon: Melrose Nakayama, MD;  Location: [facility];  Service: Thoracic;  Laterality: Right;   VIDEO BRONCHOSCOPY N/A 07/08/2021   Procedure: VIDEO BRONCHOSCOPY;  Surgeon: Melrose Nakayama, MD;  Location: [facility];  Service: Thoracic;  Laterality: N/A;    HEMATOLOGY/ONCOLOGY HISTORY:  Oncology History Overview Note  # NOV [id number] [LUNG CANCER SCREENING PROGRAM]-18 mm right upper lobe lung nodule; [place] 2021- s/p right upper lobectomy [Dr. Roxan Hockey; [place]]; STAGE: I [pT-18 mm; LN-12=0]; predominant large cell neuroendocrine; minor adenocarcinoma.  Declines adjuvant chemotherapy.  #Recurrent/stage IV cancer NOV 2022- PET scan-scapular lesion liver lesion gastrohepatic lymphadenopathy.  11/21- start RT to right scapular lesion  # DEC 3rd, 2022- CARBO=ETOP+TECEN; udenyca #1; SEP 2023- STOP Tencetriq- progression  SEP 2023-liver biopsy shows neuroendocrine carcinoma/large cell; no adenocarcinoma noted.  Discontinue Tecentriq any progression of disease.  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   # OCTOBER, 2023-s /p carboplatin etoposide x4 cycles- without immunotherapy [progressed while on Atezo].  CT scan FEB 1st, 2024- Two new solid pulmonary nodules, largest 0.8 cm in the posterior left upper lobe, suspicious for new pulmonary metastases. Two enlarging left liver metastases, including substantial growth of the dominant bulky 9.7 cm segment 3 left liver metastasis. Stable expansile mixed lytic and sclerotic right scapular body metastasis.  # FEB 2024- Lubrinectidin cycle #1.   # # MAY 2023- Right swollen eye/orbital  inflammation/uveitis s/p Zometa status post steroids improved.-reviewed literature case reports noted; less concerning for tumor involvement.  DISCONTINUE ZOMETA.  NGS: Negative for any targetable mutation; PD-L1 0; KEPASAKE*  #  SURVIVORSHIP:   # GENETICS:       Total Number of Primary Tumors: 1  Procedure: Lung lobectomy  Specimen Laterality: Right  Tumor Focality: Unifocal  Tumor Site: Upper lobe  Tumor Size: 1.8 cm  Histologic Type: Combined large cell neuroendocrine carcinoma with a  minor component of lung adenocarcinoma  Visceral Pleura Invasion: Not identified  Direct Invasion of Adjacent Structures: No adjacent structures present  Lymphovascular Invasion: Not identified  Margins: All margins negative for invasive carcinoma       Closest Margin(s) to Invasive Carcinoma: Bronchovascular margin  Treatment Effect: No known presurgical therapy  Regional Lymph Nodes:       Number of Lymph Nodes Involved: 0                            Nodal Sites with Tumor: Not applicable       Number of Lymph Nodes Examined: 12      Primary cancer of right upper lobe of lung ([place])  11/03/2020 Initial Diagnosis   Primary cancer of right upper lobe of lung ([place])   11/03/2020 Cancer Staging   Staging form: Lung, AJCC 8th Edition - Pathologic: Stage IA3 (pT1c, pN0, cM0) - Signed by Cammie Sickle, MD on 11/04/2020   09/14/2021 - 04/30/2022 Chemotherapy   Patient is on Treatment Plan : LUNG SCLC Carboplatin + Etoposide + Atezolizumab Induction q21d / Atezolizumab Maintenance q21d     09/15/2021 - 06/18/2022 Chemotherapy   Patient is on Treatment Plan : LUNG SCLC Carboplatin + Etoposide + Atezolizumab Induction q21d x 4 cycles / Atezolizumab Maintenance q21d     11/09/2021 Cancer Staging   Staging form: Lung, AJCC 8th Edition - Pathologic: Stage IVB (pTX, pNX, cM1c) - Signed by Cammie Sickle, MD on 11/09/2021   06/22/2022 Pathology Results   Liver biopsy showed metastatic cancer with neuroendocrine features    08/09/2022 - 10/22/2022 Chemotherapy   Patient is on Treatment Plan : BRAIN Carboplatin (AUC 5) D1 + Etoposide (120) D1-3 q28d     11/26/2022 -  Chemotherapy   Patient is on Treatment Plan : LUNG  SMALL CELL Lurbinectedin q21d       ALLERGIES:  has No Known Allergies.  MEDICATIONS:  Current Outpatient Medications  Medication Sig Dispense Refill   albuterol (PROVENTIL HFA) 108 (90 Base) MCG/ACT inhaler Inhale 2 puffs into the lungs once every 6 (six) hours as needed for wheezing or shortness of breath. 18 g 2   DULoxetine (CYMBALTA) 30 MG capsule Take 1 capsule (30 mg total) by mouth daily. (Patient not taking: Reported on 09/06/2022) 30 capsule 3   lidocaine (LIDODERM) 5 % Place 1 patch onto the skin daily. Remove & Discard patch within 12 hours or as directed by MD (Patient not taking: Reported on 06/29/2022) 30 patch 2   lidocaine-prilocaine (EMLA) cream Apply one application topically the the affected area(s) daily as needed. 30 g 3   loratadine (CLARITIN) 10 MG tablet Take 1 tablet (10 mg total) by mouth daily. 30 tablet 2   metFORMIN (GLUCOPHAGE) 500 MG tablet Take 500 mg by mouth daily with breakfast.     naloxone (NARCAN) nasal spray 4 mg/0.1 mL SPRAY 1 SPRAY INTO ONE NOSTRIL AS DIRECTED FOR OPIOID OVERDOSE (TURN PERSON ON SIDE  AFTER DOSE. IF NO RESPONSE IN 2-3 MINUTES OR PERSON RESPONDS BUT RELAPSES, REPEAT USING A NEW SPRAY DEVICE AND SPRAY INTO THE OTHER NOSTRIL. CALL 911 AFTER USE.) * EMERGENCY USE ONLY * 2 each 0   ondansetron (ZOFRAN) 4 MG tablet TAKE 2 TABLETS ('8MG'$ ) BY MOUTH ONCE EVERY 8 HOURS AS NEEDED FOR NAUSEA OR VOMITING. 80 tablet 1   oxyCODONE (OXYCONTIN) 20 mg 12 hr tablet Take 1 tablet (20 mg total) by mouth every 12 (twelve) hours. 60 tablet 0   oxyCODONE-acetaminophen (PERCOCET) 10-325 MG tablet Take 1-2 tablets by mouth every 4 (four) hours as needed for pain. 60 tablet 0   potassium chloride SA (KLOR-CON M) 20 MEQ tablet Take 1 tablet (20 mEq total) by mouth 2 (two) times daily. 60 tablet 3   predniSONE (DELTASONE) 20 MG tablet Take 1 tablet (20 mg total) by mouth daily with breakfast. Once a day with food. (Patient not taking: Reported on 09/06/2022) 30 tablet 0    prochlorperazine (COMPAZINE) 10 MG tablet Take 1 tablet (10 mg total) by mouth once every 6 (six) hours as needed for nausea or vomiting. 40 tablet 1   No current facility-administered medications for this visit.   Facility-Administered Medications Ordered in Other Visits  Medication Dose Route Frequency Provider Last Rate Last Admin   heparin lock flush 100 UNIT/ML injection            heparin lock flush 100 unit/mL  500 Units Intravenous Once Charlaine Dalton R, MD       morphine (PF) 2 MG/ML injection            sodium chloride flush (NS) 0.9 % injection 10 mL  10 mL Intravenous PRN Charlaine Dalton R, MD   10 mL at 03/01/22 0857   sodium chloride flush (NS) 0.9 % injection 10 mL  10 mL Intracatheter PRN Cammie Sickle, MD   10 mL at 12/17/22 1124    VITAL SIGNS: There were no vitals taken for this visit. There were no vitals filed for this visit.   Estimated body mass index is 22.16 kg/m as calculated from the following:   Height as of 09/06/22: '5\' 7"'$  (1.702 m).   Weight as of an earlier encounter on 12/17/22: 141 lb 8 oz (64.2 kg).  LABS: CBC:    Component Value Date/Time   WBC 2.8 (L) 12/17/2022 0825   HGB 10.2 (L) 12/17/2022 0825   HGB 11.6 (L) 12/03/2022 0925   HGB 15.5 11/26/2020 1214   HCT 30.9 (L) 12/17/2022 0825   HCT 47.6 (H) 11/26/2020 1214   PLT 275 12/17/2022 0825   PLT 175 12/03/2022 0925   PLT 317 11/26/2020 1214   MCV 97.8 12/17/2022 0825   MCV 87 11/26/2020 1214   NEUTROABS 1.1 (L) 12/17/2022 0825   NEUTROABS 2.5 11/26/2020 1214   LYMPHSABS 1.2 12/17/2022 0825   LYMPHSABS 3.6 (H) 11/26/2020 1214   MONOABS 0.4 12/17/2022 0825   EOSABS 0.0 12/17/2022 0825   EOSABS 0.2 11/26/2020 1214   BASOSABS 0.0 12/17/2022 0825   BASOSABS 0.1 11/26/2020 1214   Comprehensive Metabolic Panel:    Component Value Date/Time   NA 138 12/17/2022 0825   NA 139 11/26/2020 1214   K 3.3 (L) 12/17/2022 0825   CL 105 12/17/2022 0825   CO2 25 12/17/2022 0825    BUN 7 (L) 12/17/2022 0825   BUN 8 11/26/2020 1214   CREATININE 0.45 12/17/2022 0825   CREATININE 0.62 12/03/2022 0925   GLUCOSE 97  12/17/2022 0825   CALCIUM 8.8 (L) 12/17/2022 0825   AST 15 12/17/2022 0825   AST 29 12/03/2022 0925   ALT 8 12/17/2022 0825   ALT 17 12/03/2022 0925   ALKPHOS 80 12/17/2022 0825   BILITOT 0.3 12/17/2022 0825   BILITOT 0.5 12/03/2022 0925   PROT 6.9 12/17/2022 0825   PROT 6.9 11/26/2020 1214   ALBUMIN 3.2 (L) 12/17/2022 0825   ALBUMIN 4.0 11/26/2020 1214    RADIOGRAPHIC STUDIES: No results found.  PERFORMANCE STATUS (ECOG) : 1 - Symptomatic but completely ambulatory  Review of Systems Unless otherwise noted, a complete review of systems is negative.  Physical Exam General: NAD Pulmonary: Unlabored Extremities: no edema, no joint deformities Skin: no rashes Neurological: Weakness but otherwise nonfocal  IMPRESSION: CT of the chest abdomen and pelvis on 11/11/2022 showed disease progression with new pulmonary nodules, enlarging liver metastases.  Patient seen in infusion.  She denies any significant changes or concerns today.  She denies any distressing symptoms.  She feels like things are going reasonably well.  MOST form reviewed patient took document home to discuss with family.  PLAN: -Continue current scope of treatment -Continue OxyContin/Percocet -MOST Form reviewed -Follow-up telephone visit 1 to 2 months   Patient expressed understanding and was in agreement with this plan. She also understands that She can call the clinic at any time with any questions, concerns, or complaints.     Time Total: 15 minutes  Visit consisted of counseling and education dealing with the complex and emotionally intense issues of symptom management and palliative care in the setting of serious and potentially life-threatening illness.Greater than 50%  of this time was spent counseling and coordinating care related to the above assessment and  plan.  Signed by: Altha Harm, PhD, NP-C

## 2022-12-17 NOTE — Progress Notes (Signed)
No new concerns today 

## 2022-12-17 NOTE — Assessment & Plan Note (Addendum)
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 S/p carboplatin etoposide x4 cycles- without immunotherapy [progressed while on Atezo].CT scan FEB 1st, 2024- Two new solid pulmonary nodules, largest 0.8 cm in the posterior left upper lobe, suspicious for new pulmonary metastases. Two enlarging left liver metastases, including substantial growth of the dominant bulky 9.7 cm segment 3 left liver metastasis. Stable expansile mixed lytic and sclerotic right scapular body metastasis. Stable   # proceed with Lubrinectidin cycle #2  today. Labs today reviewed;  acceptable for treatment today. ANC 1.1.  Consider adding growth factor support if continues to be low at next visit.  # Hypokalemia- mild: recommend Kdur BID.   # mets to bone/ Pain right shoulder- currently s/p RT [ 09/16/2021]; STABLE;  continue percocet 10 mg every 4-6 hours also. S/p  RT on Oct 16th , 2023-. 10 fractions. OFF  fentanyl patch; On oxycontin; and oxycodone as per palliative care.  Refilled by Merrily Pew. Wants to Hold off IR ablation as pain is improved. Stable   # constipation:  Miralax BID; fluids; Dulcolax- Stable   # DM:  On Metformin-stable  # COPD: refilled albuterol; stable  #IV access: port functional-  functional  # Goals of care: Palliative.  S/p evaluation with Josh Borders.  Patient understands that if she has progression of disease/worsening performance status-transition to hospice.   # DISPOSITION:  # lurbinectdin chemo today # follow up in April 1st- MD; labs- cbc/cmp; lurbinectdin- Dr.B

## 2022-12-20 ENCOUNTER — Telehealth: Payer: Medicaid Other | Admitting: Hospice and Palliative Medicine

## 2022-12-27 ENCOUNTER — Other Ambulatory Visit: Payer: Self-pay

## 2022-12-27 ENCOUNTER — Other Ambulatory Visit: Payer: Self-pay | Admitting: *Deleted

## 2022-12-27 ENCOUNTER — Other Ambulatory Visit (HOSPITAL_COMMUNITY): Payer: Self-pay

## 2022-12-27 MED ORDER — OXYCODONE-ACETAMINOPHEN 10-325 MG PO TABS
1.0000 | ORAL_TABLET | ORAL | 0 refills | Status: DC | PRN
  Filled 2022-12-27: qty 60, 5d supply, fill #0

## 2023-01-06 ENCOUNTER — Other Ambulatory Visit: Payer: Self-pay

## 2023-01-06 ENCOUNTER — Other Ambulatory Visit: Payer: Self-pay | Admitting: *Deleted

## 2023-01-06 MED ORDER — OXYCODONE-ACETAMINOPHEN 10-325 MG PO TABS
1.0000 | ORAL_TABLET | ORAL | 0 refills | Status: DC | PRN
  Filled 2023-01-06: qty 90, 8d supply, fill #0

## 2023-01-06 MED ORDER — OXYCODONE HCL ER 20 MG PO T12A
20.0000 mg | EXTENDED_RELEASE_TABLET | Freq: Two times a day (BID) | ORAL | 0 refills | Status: DC
  Filled 2023-01-06: qty 60, 30d supply, fill #0

## 2023-01-07 ENCOUNTER — Other Ambulatory Visit: Payer: Self-pay

## 2023-01-07 MED FILL — Dexamethasone Sodium Phosphate Inj 100 MG/10ML: INTRAMUSCULAR | Qty: 1 | Status: AC

## 2023-01-10 ENCOUNTER — Encounter: Payer: Self-pay | Admitting: Internal Medicine

## 2023-01-10 ENCOUNTER — Inpatient Hospital Stay: Payer: Medicaid Other

## 2023-01-10 ENCOUNTER — Other Ambulatory Visit: Payer: Self-pay

## 2023-01-10 ENCOUNTER — Inpatient Hospital Stay: Payer: Medicaid Other | Attending: Radiation Oncology | Admitting: Internal Medicine

## 2023-01-10 VITALS — BP 115/78 | HR 77 | Temp 98.0°F | Resp 16 | Wt 137.2 lb

## 2023-01-10 DIAGNOSIS — J439 Emphysema, unspecified: Secondary | ICD-10-CM | POA: Diagnosis not present

## 2023-01-10 DIAGNOSIS — Z87440 Personal history of urinary (tract) infections: Secondary | ICD-10-CM | POA: Diagnosis not present

## 2023-01-10 DIAGNOSIS — C3411 Malignant neoplasm of upper lobe, right bronchus or lung: Secondary | ICD-10-CM | POA: Insufficient documentation

## 2023-01-10 DIAGNOSIS — K5900 Constipation, unspecified: Secondary | ICD-10-CM | POA: Insufficient documentation

## 2023-01-10 DIAGNOSIS — I700 Atherosclerosis of aorta: Secondary | ICD-10-CM | POA: Insufficient documentation

## 2023-01-10 DIAGNOSIS — Z79899 Other long term (current) drug therapy: Secondary | ICD-10-CM | POA: Diagnosis not present

## 2023-01-10 DIAGNOSIS — K760 Fatty (change of) liver, not elsewhere classified: Secondary | ICD-10-CM | POA: Insufficient documentation

## 2023-01-10 DIAGNOSIS — R591 Generalized enlarged lymph nodes: Secondary | ICD-10-CM | POA: Insufficient documentation

## 2023-01-10 DIAGNOSIS — C7951 Secondary malignant neoplasm of bone: Secondary | ICD-10-CM | POA: Diagnosis not present

## 2023-01-10 DIAGNOSIS — E119 Type 2 diabetes mellitus without complications: Secondary | ICD-10-CM | POA: Insufficient documentation

## 2023-01-10 DIAGNOSIS — Z8719 Personal history of other diseases of the digestive system: Secondary | ICD-10-CM | POA: Diagnosis not present

## 2023-01-10 DIAGNOSIS — M25511 Pain in right shoulder: Secondary | ICD-10-CM | POA: Insufficient documentation

## 2023-01-10 DIAGNOSIS — F17210 Nicotine dependence, cigarettes, uncomplicated: Secondary | ICD-10-CM | POA: Diagnosis not present

## 2023-01-10 DIAGNOSIS — Z7952 Long term (current) use of systemic steroids: Secondary | ICD-10-CM | POA: Diagnosis not present

## 2023-01-10 DIAGNOSIS — C787 Secondary malignant neoplasm of liver and intrahepatic bile duct: Secondary | ICD-10-CM | POA: Insufficient documentation

## 2023-01-10 DIAGNOSIS — Z5111 Encounter for antineoplastic chemotherapy: Secondary | ICD-10-CM | POA: Diagnosis present

## 2023-01-10 DIAGNOSIS — R112 Nausea with vomiting, unspecified: Secondary | ICD-10-CM | POA: Diagnosis not present

## 2023-01-10 DIAGNOSIS — E876 Hypokalemia: Secondary | ICD-10-CM | POA: Insufficient documentation

## 2023-01-10 DIAGNOSIS — Z95828 Presence of other vascular implants and grafts: Secondary | ICD-10-CM

## 2023-01-10 DIAGNOSIS — R200 Anesthesia of skin: Secondary | ICD-10-CM | POA: Diagnosis not present

## 2023-01-10 LAB — COMPREHENSIVE METABOLIC PANEL
ALT: 8 U/L (ref 0–44)
AST: 15 U/L (ref 15–41)
Albumin: 3.4 g/dL — ABNORMAL LOW (ref 3.5–5.0)
Alkaline Phosphatase: 114 U/L (ref 38–126)
Anion gap: 7 (ref 5–15)
BUN: 11 mg/dL (ref 8–23)
CO2: 24 mmol/L (ref 22–32)
Calcium: 9 mg/dL (ref 8.9–10.3)
Chloride: 106 mmol/L (ref 98–111)
Creatinine, Ser: 0.47 mg/dL (ref 0.44–1.00)
GFR, Estimated: >60 mL/min (ref >60)
Glucose, Bld: 96 mg/dL (ref 70–99)
Potassium: 3.6 mmol/L (ref 3.5–5.1)
Sodium: 137 mmol/L (ref 135–145)
Total Bilirubin: 0.3 mg/dL (ref 0.3–1.2)
Total Protein: 7.2 g/dL (ref 6.5–8.1)

## 2023-01-10 LAB — CBC WITH DIFFERENTIAL/PLATELET
Abs Immature Granulocytes: 0.02 10*3/uL (ref 0.00–0.07)
Basophils Absolute: 0 10*3/uL (ref 0.0–0.1)
Basophils Relative: 0 %
Eosinophils Absolute: 0 10*3/uL (ref 0.0–0.5)
Eosinophils Relative: 0 %
HCT: 31.3 % — ABNORMAL LOW (ref 36.0–46.0)
Hemoglobin: 10.3 g/dL — ABNORMAL LOW (ref 12.0–15.0)
Immature Granulocytes: 0 %
Lymphocytes Relative: 32 %
Lymphs Abs: 1.8 10*3/uL (ref 0.7–4.0)
MCH: 31.2 pg (ref 26.0–34.0)
MCHC: 32.9 g/dL (ref 30.0–36.0)
MCV: 94.8 fL (ref 80.0–100.0)
Monocytes Absolute: 0.6 10*3/uL (ref 0.1–1.0)
Monocytes Relative: 10 %
Neutro Abs: 3 10*3/uL (ref 1.7–7.7)
Neutrophils Relative %: 58 %
Platelets: 335 10*3/uL (ref 150–400)
RBC: 3.3 MIL/uL — ABNORMAL LOW (ref 3.87–5.11)
RDW: 16.6 % — ABNORMAL HIGH (ref 11.5–15.5)
WBC: 5.4 10*3/uL (ref 4.0–10.5)
nRBC: 0 % (ref 0.0–0.2)

## 2023-01-10 MED ORDER — SODIUM CHLORIDE 0.9 % IV SOLN
10.0000 mg | Freq: Once | INTRAVENOUS | Status: AC
  Administered 2023-01-10: 10 mg via INTRAVENOUS
  Filled 2023-01-10: qty 10

## 2023-01-10 MED ORDER — HEPARIN SOD (PORK) LOCK FLUSH 100 UNIT/ML IV SOLN
INTRAVENOUS | Status: AC
  Filled 2023-01-10: qty 5

## 2023-01-10 MED ORDER — SODIUM CHLORIDE 0.9 % IV SOLN
3.2000 mg/m2 | Freq: Once | INTRAVENOUS | Status: AC
  Administered 2023-01-10: 5.55 mg via INTRAVENOUS
  Filled 2023-01-10: qty 11.1

## 2023-01-10 MED ORDER — HEPARIN SOD (PORK) LOCK FLUSH 100 UNIT/ML IV SOLN
500.0000 [IU] | Freq: Once | INTRAVENOUS | Status: AC | PRN
  Filled 2023-01-10: qty 5

## 2023-01-10 MED ORDER — OLANZAPINE 5 MG PO TABS
5.0000 mg | ORAL_TABLET | Freq: Every day | ORAL | 3 refills | Status: DC
  Filled 2023-01-10: qty 30, 30d supply, fill #0

## 2023-01-10 MED ORDER — SODIUM CHLORIDE 0.9 % IV SOLN
Freq: Once | INTRAVENOUS | Status: AC
  Filled 2023-01-10: qty 250

## 2023-01-10 MED ORDER — PALONOSETRON HCL INJECTION 0.25 MG/5ML
0.2500 mg | Freq: Once | INTRAVENOUS | Status: AC
  Administered 2023-01-10: 0.25 mg via INTRAVENOUS
  Filled 2023-01-10: qty 5

## 2023-01-10 MED ORDER — ALTEPLASE 2 MG IJ SOLR
2.0000 mg | Freq: Once | INTRAMUSCULAR | Status: AC
  Administered 2023-01-10: 2 mg
  Filled 2023-01-10: qty 2

## 2023-01-10 NOTE — Assessment & Plan Note (Addendum)
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 S/p carboplatin etoposide x4 cycles- without immunotherapy [progressed while on Atezo].CT scan FEB 1st, 2024- Two new solid pulmonary nodules, largest 0.8 cm in the posterior left upper lobe, suspicious for new pulmonary metastases. Two enlarging left liver metastases, including substantial growth of the dominant bulky 9.7 cm segment 3 left liver metastasis. Stable expansile mixed lytic and sclerotic right scapular body metastasis. Stable   # proceed with Lubrinectidin cycle #3 today. Labs today reviewed;  acceptable for treatment today. ANC 3.0  Consider adding growth factor support if continues to be low at next visit. Will order CT scan after this cycle.   # right foot drop sec to chemo-low clinical suspicion for CNS metastases at this time.  Hold off MRI brain at this time.  Recommend evaluation with [facility] Greet.   # Hypokalemia- 3.6 today- hold off kdur- sec to nausea/vomiting.   # mets to bone/ Pain right shoulder- currently s/p RT [ 09/16/2021]; STABLE;  continue percocet 10 mg every 4-6 hours also. S/p  RT on Oct 16th , 2023-. 10 fractions. OFF  fentanyl patch; On oxycontin; and oxycodone as per palliative care.  Refilled by Merrily Pew. Wants to Hold off IR ablation as pain is improved. Stable   # weight loss/ loss of apetite/ nausea- recommend zyprexa qhs.   # constipation:  Miralax BID; fluids; Dulcolax- Stable   # DM:  On Metformin-stable  # COPD: refilled albuterol; stable  #IV access: port functional-  functional  # DISPOSITION:  # lurbinectdin chemo today # referral to [facility] Greet re: right foot drop. # follow up in 3 weeks-  MD; labs- cbc/cmp; lurbinectdin; CT CAP prior-  Dr.B

## 2023-01-10 NOTE — Progress Notes (Signed)
No appetite. Having numbness in roght foot to mid calf. Started yest morning, no pain, no swelling.Has chronic pain in Right arm. Feels weak and rundown. Constipation at times mostly after treatment. Taking metamucil for that.
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
## 2023-01-10 NOTE — Patient Instructions (Signed)
[facility]  Discharge Instructions: Thank you for choosing [facility] to provide your oncology and hematology care.  If you have a lab appointment with the [facility], please go directly to the [facility] and check in at the registration area.  Wear comfortable clothing and clothing appropriate for easy access to any Portacath or PICC line.   We strive to give you quality time with your provider. You may need to reschedule your appointment if you arrive late (15 or more minutes).  Arriving late affects you and other patients whose appointments are after yours.  Also, if you miss three or more appointments without notifying the office, you may be dismissed from the clinic at the provider's discretion.      For prescription refill requests, have your pharmacy contact our office and allow 72 hours for refills to be completed.    Today you received the following chemotherapy and/or immunotherapy agents lurbinectdin   To help prevent nausea and vomiting after your treatment, we encourage you to take your nausea medication as directed.  BELOW ARE SYMPTOMS THAT SHOULD BE REPORTED IMMEDIATELY: *FEVER GREATER THAN 100.4 F (38 C) OR HIGHER *CHILLS OR SWEATING *NAUSEA AND VOMITING THAT IS NOT CONTROLLED WITH YOUR NAUSEA MEDICATION *UNUSUAL SHORTNESS OF BREATH *UNUSUAL BRUISING OR BLEEDING *URINARY PROBLEMS (pain or burning when urinating, or frequent urination) *BOWEL PROBLEMS (unusual diarrhea, constipation, pain near the anus) TENDERNESS IN MOUTH AND THROAT WITH OR WITHOUT PRESENCE OF ULCERS (sore throat, sores in mouth, or a toothache) UNUSUAL RASH, SWELLING OR PAIN  UNUSUAL VAGINAL DISCHARGE OR ITCHING   Items with * indicate a potential emergency and should be followed up as soon as possible or go to the Emergency Department if any problems should occur.  Please show the CHEMOTHERAPY ALERT CARD or IMMUNOTHERAPY ALERT CARD at check-in to  the Emergency Department and triage nurse.  Should you have questions after your visit or need to cancel or reschedule your appointment, please contact [facility]  [phone] and follow the prompts.  Office hours are 8:00 a.m. to 4:30 p.m. Monday - Friday. Please note that voicemails left after 4:00 p.m. may not be returned until the following business day.  We are closed weekends and major holidays. You have access to a nurse at all times for urgent questions. Please call the main number to the clinic [phone] and follow the prompts.  For any non-urgent questions, you may also contact your provider using MyChart. We now offer e-Visits for anyone 40 and older to request care online for non-urgent symptoms. For details visit [email or web address].   Also download the MyChart app! Go to the app store, search "MyChart", open the app, select [facility], and log in with your MyChart username and password.

## 2023-01-12 ENCOUNTER — Telehealth: Payer: Self-pay | Admitting: *Deleted

## 2023-01-12 NOTE — Telephone Encounter (Signed)
Initiated prior auth/ safety documentation with [facility] for olanzipine 5 mg q hs verbally over the telephone. Pharmacy was able to fill that initial prescription x 1  while prior auth in status. We will receive a fax once authorization is decided.

## 2023-01-13 ENCOUNTER — Other Ambulatory Visit: Payer: Self-pay

## 2023-01-25 ENCOUNTER — Other Ambulatory Visit: Payer: Self-pay | Admitting: *Deleted

## 2023-01-26 ENCOUNTER — Other Ambulatory Visit: Payer: Self-pay

## 2023-01-26 ENCOUNTER — Encounter: Payer: Self-pay | Admitting: Internal Medicine

## 2023-01-26 ENCOUNTER — Inpatient Hospital Stay: Payer: Medicaid Other | Admitting: Occupational Therapy

## 2023-01-26 MED ORDER — OXYCODONE-ACETAMINOPHEN 10-325 MG PO TABS
1.0000 | ORAL_TABLET | ORAL | 0 refills | Status: DC | PRN
  Filled 2023-01-26: qty 90, 8d supply, fill #0

## 2023-01-27 ENCOUNTER — Encounter: Payer: Self-pay | Admitting: Internal Medicine

## 2023-01-27 ENCOUNTER — Other Ambulatory Visit: Payer: Self-pay

## 2023-01-27 ENCOUNTER — Ambulatory Visit
Admission: RE | Admit: 2023-01-27 | Discharge: 2023-01-27 | Disposition: A | Discharge status: Home / Self Care | Payer: Medicaid Other | Source: Ambulatory Visit | Attending: Internal Medicine | Admitting: Internal Medicine

## 2023-01-27 DIAGNOSIS — C3411 Malignant neoplasm of upper lobe, right bronchus or lung: Secondary | ICD-10-CM | POA: Diagnosis present

## 2023-01-27 MED ORDER — IOHEXOL 300 MG/ML  SOLN
80.0000 mL | Freq: Once | INTRAMUSCULAR | Status: AC | PRN
  Administered 2023-01-27: 80 mL via INTRAVENOUS

## 2023-01-28 MED FILL — Dexamethasone Sodium Phosphate Inj 100 MG/10ML: INTRAMUSCULAR | Qty: 1 | Status: AC

## 2023-01-31 ENCOUNTER — Encounter: Payer: Self-pay | Admitting: Internal Medicine

## 2023-01-31 ENCOUNTER — Inpatient Hospital Stay: Payer: Medicaid Other

## 2023-01-31 ENCOUNTER — Other Ambulatory Visit: Payer: Self-pay

## 2023-01-31 ENCOUNTER — Inpatient Hospital Stay (HOSPITAL_BASED_OUTPATIENT_CLINIC_OR_DEPARTMENT_OTHER): Payer: Medicaid Other | Admitting: Internal Medicine

## 2023-01-31 VITALS — BP 128/74 | HR 76 | Temp 97.7°F | Ht 67.0 in | Wt 136.0 lb

## 2023-01-31 DIAGNOSIS — C3411 Malignant neoplasm of upper lobe, right bronchus or lung: Secondary | ICD-10-CM

## 2023-01-31 DIAGNOSIS — Z5111 Encounter for antineoplastic chemotherapy: Secondary | ICD-10-CM | POA: Diagnosis not present

## 2023-01-31 DIAGNOSIS — Z95828 Presence of other vascular implants and grafts: Secondary | ICD-10-CM

## 2023-01-31 LAB — CBC WITH DIFFERENTIAL/PLATELET
Abs Immature Granulocytes: 0.02 10*3/uL (ref 0.00–0.07)
Basophils Absolute: 0 10*3/uL (ref 0.0–0.1)
Basophils Relative: 1 %
Eosinophils Absolute: 0 10*3/uL (ref 0.0–0.5)
Eosinophils Relative: 1 %
HCT: 34.4 % — ABNORMAL LOW (ref 36.0–46.0)
Hemoglobin: 11.3 g/dL — ABNORMAL LOW (ref 12.0–15.0)
Immature Granulocytes: 1 %
Lymphocytes Relative: 42 %
Lymphs Abs: 1.7 10*3/uL (ref 0.7–4.0)
MCH: 30.5 pg (ref 26.0–34.0)
MCHC: 32.8 g/dL (ref 30.0–36.0)
MCV: 92.7 fL (ref 80.0–100.0)
Monocytes Absolute: 0.8 10*3/uL (ref 0.1–1.0)
Monocytes Relative: 19 %
Neutro Abs: 1.4 10*3/uL — ABNORMAL LOW (ref 1.7–7.7)
Neutrophils Relative %: 36 %
Platelets: 340 10*3/uL (ref 150–400)
RBC: 3.71 MIL/uL — ABNORMAL LOW (ref 3.87–5.11)
RDW: 17.5 % — ABNORMAL HIGH (ref 11.5–15.5)
WBC: 3.9 10*3/uL — ABNORMAL LOW (ref 4.0–10.5)
nRBC: 0 % (ref 0.0–0.2)

## 2023-01-31 LAB — COMPREHENSIVE METABOLIC PANEL
ALT: 8 U/L (ref 0–44)
AST: 17 U/L (ref 15–41)
Albumin: 3.5 g/dL (ref 3.5–5.0)
Alkaline Phosphatase: 119 U/L (ref 38–126)
Anion gap: 9 (ref 5–15)
BUN: 9 mg/dL (ref 8–23)
CO2: 24 mmol/L (ref 22–32)
Calcium: 9.1 mg/dL (ref 8.9–10.3)
Chloride: 104 mmol/L (ref 98–111)
Creatinine, Ser: 0.45 mg/dL (ref 0.44–1.00)
GFR, Estimated: >60 mL/min (ref >60)
Glucose, Bld: 93 mg/dL (ref 70–99)
Potassium: 3.6 mmol/L (ref 3.5–5.1)
Sodium: 137 mmol/L (ref 135–145)
Total Bilirubin: 0.2 mg/dL — ABNORMAL LOW (ref 0.3–1.2)
Total Protein: 7.2 g/dL (ref 6.5–8.1)

## 2023-01-31 MED ORDER — LIDOCAINE-PRILOCAINE 2.5-2.5 % EX CREA
1.0000 | TOPICAL_CREAM | CUTANEOUS | 3 refills | Status: DC | PRN
  Filled 2023-01-31: qty 30, 20d supply, fill #0

## 2023-01-31 MED ORDER — HEPARIN SOD (PORK) LOCK FLUSH 100 UNIT/ML IV SOLN
500.0000 [IU] | Freq: Once | INTRAVENOUS | Status: AC
  Administered 2023-01-31: 500 [IU] via INTRAVENOUS
  Filled 2023-01-31: qty 5

## 2023-01-31 MED ORDER — ALTEPLASE 2 MG IJ SOLR
2.0000 mg | Freq: Once | INTRAMUSCULAR | Status: AC
  Administered 2023-01-31: 2 mg
  Filled 2023-01-31: qty 2

## 2023-01-31 NOTE — Addendum Note (Signed)
Addended by: Darrold Span A on: 01/31/2023 01:06 PM   Modules accepted: Orders

## 2023-01-31 NOTE — Addendum Note (Signed)
Addended by: Earna Coder on: 01/31/2023 12:37 PM   Modules accepted: Orders

## 2023-01-31 NOTE — Assessment & Plan Note (Addendum)
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 #  s/p  Lubrinectidin cycle #3- APRIL 19th CT -shows progressive disease in the liver; New or at least significantly enlarged subpleural nodule of the peripheral right lower lobe, measuring 0.7 x 0.5 cm. This is also nonspecific but is worrisome for a small metastasis. Unchanged mixed lytic and sclerotic metastasis of the right scapular body. No new osseous lesions identified.  # DISCONTINUE Lubrinectidin-given the progressive disease. Given STK-11/KEAPSAKE mutations-discussed regarding use of IPI+NIVO every 3 weeks x 4 followed by annual by maintenance.  Patient understands treatments are palliative not curative.  Chance of response rates about less than 20%.    I discussed the mechanism of action; The goal of therapy is palliative; and length of treatments are likely ongoing/based upon the results of the scans. Discussed the potential side effects of immunotherapy including but not limited to diarrhea; skin rash; elevated LFTs/endocrine abnormalities etc. check TSH.  # right foot drop sec to chemo-stable/improved- monitor for now.   # Hypokalemia- 3.6 today- Stable   # mets to bone/ Pain right shoulder- currently s/p RT [ 09/16/2021]; STABLE;  continue percocet 10 mg every 4-6 hours also. S/p  RT on Oct 16th , 2023-. 10 fractions. OFF  fentanyl patch; On oxycontin; and oxycodone as per palliative care.  Refilled by Sharia Reeve. Wants to Hold off IR ablation as pain is improved. Stable   # weight loss/ loss of apetite/ nausea- recommend zyprexa qhs.   # constipation:  Miralax BID; fluids; Dulcolax- Stable   # DM:  On Metformin-stable  # COPD: refilled albuterol; stable  #IV access: port functional- s/p TPA- functional  # DISPOSITION:  # HOLD  lurbinectdin chemo today; de-access # follow up in 1 week-  MD; port-labs- cbc/cmp;TSH-  IPI+NIVO- Dr.B  # 40 minutes face-to-face with the patient discussing the above plan of care; more than 50% of time spent on prognosis/ natural history; counseling and coordination.  # I reviewed the blood work- with the patient in detail; also reviewed the imaging independently [as summarized above]; and with the patient in detail.

## 2023-01-31 NOTE — Progress Notes (Signed)
DISCONTINUE OFF PATHWAY REGIMEN - Other   [id number]:Lurbinectedin 3.2 mg/m2 IV D1 q21 Days:   A cycle is every 21 days:     Lurbinectedin   **Always confirm dose/schedule in your pharmacy ordering system**  REASON: Disease Progression PRIOR TREATMENT: Lurbinectedin 3.2 mg/m2 IV D1 q21 Days TREATMENT RESPONSE: Progressive Disease (PD)  START OFF PATHWAY REGIMEN - Other   [id number]:Ipilimumab 1 mg/kg IV D1 + Nivolumab 3 mg/kg IV D1 q21 Days x 4 Cycles Followed by Nivolumab 240 mg IV D1 q14 Days:   Cycles 1 through 4: A cycle is every 21 days:     Nivolumab      Ipilimumab    Cycles 5 and beyond: A cycle is every 14 days:     Nivolumab   **Always confirm dose/schedule in your pharmacy ordering system**  Patient Characteristics: Intent of Therapy: Non-Curative / Palliative Intent, Discussed with Patient
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
## 2023-02-01 ENCOUNTER — Other Ambulatory Visit: Payer: Self-pay

## 2023-02-03 ENCOUNTER — Other Ambulatory Visit: Payer: Self-pay | Admitting: *Deleted

## 2023-02-04 ENCOUNTER — Other Ambulatory Visit: Payer: Self-pay

## 2023-02-04 ENCOUNTER — Encounter: Payer: Self-pay | Admitting: Internal Medicine

## 2023-02-04 MED ORDER — OXYCODONE HCL ER 20 MG PO T12A
20.0000 mg | EXTENDED_RELEASE_TABLET | Freq: Two times a day (BID) | ORAL | 0 refills | Status: DC
  Filled 2023-02-04: qty 60, 30d supply, fill #0

## 2023-02-11 ENCOUNTER — Inpatient Hospital Stay (HOSPITAL_BASED_OUTPATIENT_CLINIC_OR_DEPARTMENT_OTHER): Payer: Medicaid Other | Admitting: Internal Medicine

## 2023-02-11 ENCOUNTER — Inpatient Hospital Stay: Payer: Medicaid Other

## 2023-02-11 ENCOUNTER — Encounter: Payer: Self-pay | Admitting: Internal Medicine

## 2023-02-11 ENCOUNTER — Inpatient Hospital Stay: Payer: Medicaid Other | Attending: Radiation Oncology

## 2023-02-11 VITALS — BP 115/71 | HR 93 | Temp 97.8°F | Ht 67.0 in | Wt 133.6 lb

## 2023-02-11 DIAGNOSIS — Z8719 Personal history of other diseases of the digestive system: Secondary | ICD-10-CM | POA: Insufficient documentation

## 2023-02-11 DIAGNOSIS — M25511 Pain in right shoulder: Secondary | ICD-10-CM | POA: Insufficient documentation

## 2023-02-11 DIAGNOSIS — Z5112 Encounter for antineoplastic immunotherapy: Secondary | ICD-10-CM | POA: Diagnosis present

## 2023-02-11 DIAGNOSIS — R110 Nausea: Secondary | ICD-10-CM | POA: Insufficient documentation

## 2023-02-11 DIAGNOSIS — Z7952 Long term (current) use of systemic steroids: Secondary | ICD-10-CM | POA: Insufficient documentation

## 2023-02-11 DIAGNOSIS — K5900 Constipation, unspecified: Secondary | ICD-10-CM | POA: Insufficient documentation

## 2023-02-11 DIAGNOSIS — F17210 Nicotine dependence, cigarettes, uncomplicated: Secondary | ICD-10-CM | POA: Insufficient documentation

## 2023-02-11 DIAGNOSIS — C3411 Malignant neoplasm of upper lobe, right bronchus or lung: Secondary | ICD-10-CM | POA: Diagnosis not present

## 2023-02-11 DIAGNOSIS — C7B8 Other secondary neuroendocrine tumors: Secondary | ICD-10-CM | POA: Diagnosis not present

## 2023-02-11 DIAGNOSIS — E876 Hypokalemia: Secondary | ICD-10-CM | POA: Diagnosis not present

## 2023-02-11 DIAGNOSIS — I2510 Atherosclerotic heart disease of native coronary artery without angina pectoris: Secondary | ICD-10-CM | POA: Insufficient documentation

## 2023-02-11 DIAGNOSIS — E7800 Pure hypercholesterolemia, unspecified: Secondary | ICD-10-CM | POA: Insufficient documentation

## 2023-02-11 DIAGNOSIS — I700 Atherosclerosis of aorta: Secondary | ICD-10-CM | POA: Diagnosis not present

## 2023-02-11 DIAGNOSIS — Z79899 Other long term (current) drug therapy: Secondary | ICD-10-CM | POA: Insufficient documentation

## 2023-02-11 DIAGNOSIS — Z7984 Long term (current) use of oral hypoglycemic drugs: Secondary | ICD-10-CM | POA: Diagnosis not present

## 2023-02-11 DIAGNOSIS — J439 Emphysema, unspecified: Secondary | ICD-10-CM | POA: Diagnosis not present

## 2023-02-11 DIAGNOSIS — E119 Type 2 diabetes mellitus without complications: Secondary | ICD-10-CM | POA: Diagnosis not present

## 2023-02-11 DIAGNOSIS — M21371 Foot drop, right foot: Secondary | ICD-10-CM | POA: Diagnosis not present

## 2023-02-11 LAB — CBC WITH DIFFERENTIAL (CANCER CENTER ONLY)
Abs Immature Granulocytes: 0.02 10*3/uL (ref 0.00–0.07)
Basophils Absolute: 0 10*3/uL (ref 0.0–0.1)
Basophils Relative: 0 %
Eosinophils Absolute: 0 10*3/uL (ref 0.0–0.5)
Eosinophils Relative: 0 %
HCT: 34.9 % — ABNORMAL LOW (ref 36.0–46.0)
Hemoglobin: 11.6 g/dL — ABNORMAL LOW (ref 12.0–15.0)
Immature Granulocytes: 0 %
Lymphocytes Relative: 26 %
Lymphs Abs: 1.6 10*3/uL (ref 0.7–4.0)
MCH: 30.1 pg (ref 26.0–34.0)
MCHC: 33.2 g/dL (ref 30.0–36.0)
MCV: 90.6 fL (ref 80.0–100.0)
Monocytes Absolute: 0.8 10*3/uL (ref 0.1–1.0)
Monocytes Relative: 12 %
Neutro Abs: 3.8 10*3/uL (ref 1.7–7.7)
Neutrophils Relative %: 62 %
Platelet Count: 309 10*3/uL (ref 150–400)
RBC: 3.85 MIL/uL — ABNORMAL LOW (ref 3.87–5.11)
RDW: 17.7 % — ABNORMAL HIGH (ref 11.5–15.5)
WBC Count: 6.3 10*3/uL (ref 4.0–10.5)
nRBC: 0 % (ref 0.0–0.2)

## 2023-02-11 LAB — CMP (CANCER CENTER ONLY)
ALT: 9 U/L (ref 0–44)
AST: 22 U/L (ref 15–41)
Albumin: 3.7 g/dL (ref 3.5–5.0)
Alkaline Phosphatase: 118 U/L (ref 38–126)
Anion gap: 9 (ref 5–15)
BUN: 10 mg/dL (ref 8–23)
CO2: 26 mmol/L (ref 22–32)
Calcium: 9.6 mg/dL (ref 8.9–10.3)
Chloride: 101 mmol/L (ref 98–111)
Creatinine: 0.63 mg/dL (ref 0.44–1.00)
GFR, Estimated: >60 mL/min (ref >60)
Glucose, Bld: 92 mg/dL (ref 70–99)
Potassium: 3.4 mmol/L — ABNORMAL LOW (ref 3.5–5.1)
Sodium: 136 mmol/L (ref 135–145)
Total Bilirubin: 0.4 mg/dL (ref 0.3–1.2)
Total Protein: 7.7 g/dL (ref 6.5–8.1)

## 2023-02-11 LAB — TSH: TSH: 1.573 u[IU]/mL (ref 0.350–4.500)

## 2023-02-11 MED ORDER — FAMOTIDINE IN NACL 20-0.9 MG/50ML-% IV SOLN
20.0000 mg | Freq: Once | INTRAVENOUS | Status: AC
  Administered 2023-02-11: 20 mg via INTRAVENOUS
  Filled 2023-02-11: qty 50

## 2023-02-11 MED ORDER — SODIUM CHLORIDE 0.9 % IV SOLN
2.9200 mg/kg | Freq: Once | INTRAVENOUS | Status: AC
  Administered 2023-02-11: 180 mg via INTRAVENOUS
  Filled 2023-02-11: qty 8

## 2023-02-11 MED ORDER — DIPHENHYDRAMINE HCL 50 MG/ML IJ SOLN
25.0000 mg | Freq: Once | INTRAMUSCULAR | Status: AC
  Administered 2023-02-11: 25 mg via INTRAVENOUS
  Filled 2023-02-11: qty 1

## 2023-02-11 MED ORDER — SODIUM CHLORIDE 0.9 % IV SOLN
1.0000 mg/kg | Freq: Once | INTRAVENOUS | Status: AC
  Administered 2023-02-11: 60 mg via INTRAVENOUS
  Filled 2023-02-11: qty 12

## 2023-02-11 MED ORDER — SODIUM CHLORIDE 0.9 % IV SOLN
Freq: Once | INTRAVENOUS | Status: AC
  Filled 2023-02-11: qty 250

## 2023-02-11 MED ORDER — HEPARIN SOD (PORK) LOCK FLUSH 100 UNIT/ML IV SOLN
500.0000 [IU] | Freq: Once | INTRAVENOUS | Status: AC | PRN
  Administered 2023-02-11: 500 [IU]
  Filled 2023-02-11: qty 5

## 2023-02-11 NOTE — Progress Notes (Signed)
Ipilimumab (YERVOY) Patient Monitoring Assessment   Is the patient experiencing any of the following general symptoms?:  [ ] Difficulty performing normal activities [ ] Feeling sluggish or cold all the time [ ] Unusual weight gain [ ] Constant or unusual headaches [ ] Feeling dizzy or faint [ ] Changes in eyesight (blurry vision, double vision, or other vision problems) [ ] Changes in mood or behavior (ex: decreased sex drive, irritability, or forgetfulness) [ ] Starting new medications (ex: steroids, other medications that lower immune response) [X ]Patient is not experiencing any of the general symptoms above.   Gastrointestinal  Patient is having 2-3 bowel movements each week.  Is this different from baseline? [ ] Yes [X ]No Are your stools watery or do they have a foul smell? [ ] Yes [X ]No Have you seen blood in your stools? [ ] Yes [ X]No Are your stools dark, tarry, or sticky? [ ] Yes [X ]No Are you having pain or tenderness in your belly? [ X]Yes [ ] No  Skin Does your skin itch? [ ] Yes [ X]No Do you have a rash? [ ] Yes [X ]No Has your skin blistered and/or peeled? [ ] Yes [X ]No Do you have sores in your mouth? [ ] Yes [ X]No  Hepatic Has your urine been dark or tea colored? [ ] Yes [X ]No Have you noticed that your skin or the whites of your eyes are turning yellow? [ ] Yes [X ]No Are you bleeding or bruising more easily than normal? [ ] Yes [X ]No Are you nauseous and/or vomiting? [ ] Yes [X ]No Do you have pain on the right side of your stomach? [X ]Yes [ ] No  Neurologic  Are you having unusual weakness of legs, arms, or face? [ ] Yes [ X]No Are you having numbness or tingling in your hands or feet? [ ] Yes [ X]No  Robbie Lis

## 2023-02-11 NOTE — Assessment & Plan Note (Addendum)
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 #  s/p  Lubrinectidin cycle #3- APRIL 19th CT -shows progressive disease in the liver; New or at least significantly enlarged subpleural nodule of the peripheral right lower lobe, measuring 0.7 x 0.5 cm. This is also nonspecific but is worrisome for a small metastasis. Unchanged mixed lytic and sclerotic metastasis of the right scapular body. No new osseous lesions identified.  #  Given STK-11/KEAPSAKE mutations-discussed regarding use of IPI-1+NIVO-3 every 3 weeks x 4 followed by annual by maintenance.  Patient understands treatments are palliative not curative.  Chance of response rates about less than 20%.   # Proceed with cycle #1 of IPI+ NIVO today CBC CMP are reviewed.Awaiting TSH.   Again reviewed the side effects I discussed the mechanism of action; The goal of therapy is palliative; and length of treatments are likely ongoing/based upon the results of the scans. Discussed the potential side effects of immunotherapy including but not limited to diarrhea; skin rash; elevated LFTs/endocrine abnormalities etc.   # right foot drop sec to chemo-stable/improved- monitor for now. stable  # Mild Hypokalemia-  stable  # mets to bone/ Pain right shoulder- currently s/p RT [ 09/16/2021]; STABLE;  continue percocet 10 mg every 4-6 hours also. S/p  RT on Oct 16th , 2023-. 10 fractions. OFF  fentanyl patch; On oxycontin; and oxycodone as per palliative care.  Refilled by Sharia Reeve. Wants to Hold off IR ablation as pain is improved. Stable   # weight loss/ loss of apetite/ nausea- worse- recommend zyprexa qhs; add anti-emetic preemptively prn.   # constipation:  Miralax BID; fluids; Dulcolax- Stable   # DM:  On Metformin-stable  # COPD: refilled  albuterol; stable  #IV access: port functional- s/p TPA- functional  # DISPOSITION:  # IPI+NIVO today # follow up in 3 weeks- -  MD; port-labs- cbc/cmp;LDH-  IPI+NIVO- Dr.B

## 2023-02-11 NOTE — Progress Notes (Signed)
[facility] CONSULT NOTE  Patient Care Team: Earna Coder, MD as PCP - General (Internal Medicine) Glory Buff, RN as Oncology Nurse Navigator  CHIEF COMPLAINTS/PURPOSE OF CONSULTATION: lung cancer  #  Oncology History Overview Note  # NOV -[id number] [facility] CANCER SCREENING PROGRAM]-18 mm right upper lobe lung nodule; DEC 2021- s/p right upper lobectomy [Dr. Dorris Fetch; [place]]; STAGE: I [pT-18 mm; LN-12=0]; predominant large cell neuroendocrine; minor adenocarcinoma.  DeclineD adjuvant chemotherapy.  #Recurrent/stage IV cancer NOV 2022- PET scan-scapular lesion liver lesion gastrohepatic lymphadenopathy.  11/21- start RT to right scapular lesion  # DEC 3rd, 2022- CARBO=ETOP+TECEN; udenyca #1; SEP 2023- STOP Tencetriq- progression  SEP 2023-liver biopsy shows neuroendocrine carcinoma/large cell; no adenocarcinoma noted.  Discontinue Tecentriq any progression of disease.  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   # OCTOBER, 2023-s /p carboplatin etoposide x4 cycles- without immunotherapy [progressed while on Atezo].  CT scan FEB 1st, 2024- Two new solid pulmonary nodules, largest 0.8 cm in the posterior left upper lobe, suspicious for new pulmonary metastases. Two enlarging left liver metastases, including substantial growth of the dominant bulky 9.7 cm segment 3 left liver metastasis. Stable expansile mixed lytic and sclerotic right scapular body metastasis.  # FEB 2024- Lubrinectidin cycle #3-progressive disease-noted; discontinue Lubrinectidin.   # MAY 3rd, 2024- IPI-1+NIVO-3 [KEPASAKE]- cycle #1-   # # MAY 2023- Right swollen eye/orbital inflammation/uveitis s/p Zometa status post steroids improved.-reviewed literature case reports noted; less concerning for tumor involvement.  DISCONTINUE ZOMETA.  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  Visceral Pleura Invasion: Not identified  Direct Invasion of Adjacent Structures: No adjacent structures present  Lymphovascular Invasion: Not identified  Margins: All margins negative for invasive carcinoma       Closest Margin(s) to Invasive Carcinoma: Bronchovascular margin  Treatment Effect: No known presurgical therapy  Regional Lymph Nodes:       Number of Lymph Nodes Involved: 0                            Nodal Sites with Tumor: Not applicable       Number of Lymph Nodes Examined: 12      Primary cancer of right upper lobe of lung (HCC)  11/03/2020 Initial Diagnosis   Primary cancer of right upper lobe of lung (HCC)   11/03/2020 Cancer Staging   Staging form: Lung, AJCC 8th Edition - Pathologic: Stage IA3 (pT1c, pN0, cM0) - Signed by Earna Coder, MD on 11/04/2020   09/14/2021 - 04/30/2022 Chemotherapy   Patient is on Treatment Plan : LUNG SCLC Carboplatin + Etoposide + Atezolizumab Induction q21d / Atezolizumab Maintenance q21d     09/15/2021 - 06/18/2022 Chemotherapy   Patient is on Treatment Plan : LUNG SCLC Carboplatin + Etoposide + Atezolizumab Induction q21d x 4 cycles / Atezolizumab Maintenance q21d     11/09/2021 Cancer Staging   Staging form: Lung, AJCC 8th Edition - Pathologic: Stage IVB (pTX, pNX, cM1c) - Signed by Earna Coder, MD on 11/09/2021   06/22/2022 Pathology Results   Liver biopsy showed metastatic cancer with neuroendocrine features  08/09/2022 - 10/22/2022 Chemotherapy   Patient is on Treatment Plan : BRAIN Carboplatin (AUC 5) D1 + Etoposide (120) D1-3 q28d     11/26/2022 - 01/10/2023 Chemotherapy   Patient is on Treatment Plan : LUNG SMALL CELL Lurbinectedin q21d     02/11/2023 -  Chemotherapy    Patient is on Treatment Plan : MELANOMA Nivolumab (3) + Ipilimumab (1) q21d / Nivolumab (240) q14d      HISTORY OF PRESENTING ILLNESS: Ambulating independently.  Alone.   Stacie Kennedy 62 y.o.  female with stage IV lung cancer/RECURENT predominant large cell neuroendocrine cancer - ON palliative chemotherapy is here to proceed with IPI+NIVO.  Patient continues to complain of abdominal discomfort/tightness.  Intermittent constipation. Patient continues to have shoulder pain which is better; currently on oxycontin BID; and percocets up to 3-4 day     Otherwise patient states pain is well controlled on current medication regimen.   Chronic shortness of breath-not any worse.   Review of Systems  Constitutional:  Positive for malaise/fatigue and weight loss. Negative for chills, diaphoresis and fever.  HENT:  Negative for nosebleeds and sore throat.   Eyes:  Negative for double vision.  Respiratory:  Negative for cough, hemoptysis, sputum production, shortness of breath and wheezing.   Cardiovascular:  Negative for chest pain, palpitations, orthopnea and leg swelling.  Gastrointestinal:  Positive for nausea and vomiting. Negative for abdominal pain, blood in stool, constipation, diarrhea, heartburn and melena.  Genitourinary:  Negative for dysuria, frequency and urgency.  Musculoskeletal:  Positive for back pain, joint pain and myalgias.  Skin: Negative.  Negative for itching and rash.  Neurological:  Negative for dizziness, tingling, focal weakness, weakness and headaches.  Endo/Heme/Allergies:  Does not bruise/bleed easily.  Psychiatric/Behavioral:  Negative for depression. The patient is not nervous/anxious and does not have insomnia.      MEDICAL HISTORY:  Past Medical History:  Diagnosis Date   Cancer [facility])    lung cancer   Depression    Duodenitis    Dyspnea    Family history of cancer    High cholesterol    Personal history of colonic polyps    Pre-diabetes    Umbilical  hernia    UTI (urinary tract infection)     SURGICAL HISTORY: Past Surgical History:  Procedure Laterality Date   COLONOSCOPY WITH PROPOFOL N/A 03/24/2021   Procedure: COLONOSCOPY WITH PROPOFOL;  Surgeon: Wyline Mood, MD;  Location: [facility] ENDOSCOPY;  Service: Gastroenterology;  Laterality: N/A;   INTERCOSTAL NERVE BLOCK  09/19/2020   Procedure: INTERCOSTAL NERVE BLOCK;  Surgeon: Loreli Slot, MD;  Location: [facility] OR;  Service: Thoracic;;   IR IMAGING GUIDED PORT INSERTION  09/23/2021   LUNG LOBECTOMY Right    LUNG REMOVAL, PARTIAL Right 09/19/2020   NODE DISSECTION Right 09/19/2020   Procedure: NODE DISSECTION;  Surgeon: Loreli Slot, MD;  Location: [facility] OR;  Service: Thoracic;  Laterality: Right;   VIDEO BRONCHOSCOPY N/A 07/08/2021   Procedure: VIDEO BRONCHOSCOPY;  Surgeon: Loreli Slot, MD;  Location: [facility] OR;  Service: Thoracic;  Laterality: N/A;    SOCIAL HISTORY: Social History   Socioeconomic History   Marital status: Single    Spouse name: Not on file   Number of children: Not on file   Years of education: Not on file   Highest education level: Not on file  Occupational History   Not on file  Tobacco Use   Smoking status: Some Days    Packs/day: 0.25  Years: 43.00    Additional pack years: 0.00    Total pack years: 10.75    Types: Cigarettes   Smokeless tobacco: Never   Tobacco comments:    states she is slowing, trying to quit  Vaping Use   Vaping Use: Never used  Substance and Sexual Activity   Alcohol use: Yes    Alcohol/week: 2.0 standard drinks of alcohol    Types: 2 Cans of beer per week    Comment: occasional   Drug use: No   Sexual activity: Not on file  Other Topics Concern   Not on file  Social History Narrative   Lives in [place]; smokes; now and then beer; with boy friend. 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    Social Determinants of Health   Financial Resource Strain: High Risk (04/22/2022)   Overall  Financial Resource Strain (CARDIA)    Difficulty of Paying Living Expenses: Hard  Food Insecurity: Food Insecurity Present (07/09/2021)   Hunger Vital Sign    Worried About Running Out of Food in the Last Year: Sometimes true    Ran Out of Food in the Last Year: Sometimes true  Transportation Needs: No Transportation Needs (07/09/2021)   PRAPARE - Administrator, Civil Service (Medical): No    Lack of Transportation (Non-Medical): No  Physical Activity: Insufficiently Active (04/16/2021)   Exercise Vital Sign    Days of Exercise per Week: 7 days    Minutes of Exercise per Session: 20 min  Stress: Stress Concern Present (04/22/2022)   [employer] of Occupational Health - Occupational Stress Questionnaire    Feeling of Stress : Very much  Social Connections: Socially Isolated (04/22/2022)   Social Connection and Isolation Panel [NHANES]    Frequency of Communication with Friends and Family: Once a week    Frequency of Social Gatherings with Friends and Family: Once a week    Attends Religious Services: Never    Database administrator or Organizations: No    Attends Banker Meetings: Never    Marital Status: Living with partner  Intimate Partner Violence: At Risk (04/22/2022)   Humiliation, Afraid, Rape, and Kick questionnaire    Fear of Current or Ex-Partner: No    Emotionally Abused: Yes    Physically Abused: No    Sexually Abused: No    FAMILY HISTORY: Family History  Problem Relation Age of Onset   Diabetes Mother    Cancer Mother    Diabetes Father    Stroke Father    Heart attack Father        34s   Healthy Sister    Cancer Brother    Hepatitis Brother    Diabetes Brother    Hypertension Brother    Heart disease Brother     ALLERGIES:  has No Known Allergies.  MEDICATIONS:  Current Outpatient Medications  Medication Sig Dispense Refill   albuterol (PROVENTIL HFA) 108 (90 Base) MCG/ACT inhaler Inhale 2 puffs into the lungs once every 6  (six) hours as needed for wheezing or shortness of breath. 18 g 2   lidocaine-prilocaine (EMLA) cream Apply one application topically the the affected area(s) daily as needed. 30 g 3   loratadine (CLARITIN) 10 MG tablet Take 1 tablet (10 mg total) by mouth daily. 30 tablet 2   metFORMIN (GLUCOPHAGE) 500 MG tablet Take 500 mg by mouth daily with breakfast.     naloxone (NARCAN) nasal spray 4 mg/0.1 mL SPRAY 1 SPRAY INTO ONE  NOSTRIL AS DIRECTED FOR OPIOID OVERDOSE (TURN PERSON ON SIDE AFTER DOSE. IF NO RESPONSE IN 2-3 MINUTES OR PERSON RESPONDS BUT RELAPSES, REPEAT USING A NEW SPRAY DEVICE AND SPRAY INTO THE OTHER NOSTRIL. CALL 911 AFTER USE.) * EMERGENCY USE ONLY * 2 each 0   OLANZapine (ZYPREXA) 5 MG tablet Take 1 tablet (5 mg total) by mouth at bedtime. 30 tablet 3   oxyCODONE (OXYCONTIN) 20 mg 12 hr tablet Take 1 tablet (20 mg total) by mouth every 12 (twelve) hours. 60 tablet 0   oxyCODONE-acetaminophen (PERCOCET) 10-325 MG tablet Take 1-2 tablets by mouth every 4 (four) hours as needed for pain. 90 tablet 0   potassium chloride SA (KLOR-CON M) 20 MEQ tablet Take 1 tablet (20 mEq total) by mouth 2 (two) times daily. 60 tablet 3   predniSONE (DELTASONE) 20 MG tablet Take 1 tablet (20 mg total) by mouth daily with breakfast. Once a day with food. 30 tablet 0   DULoxetine (CYMBALTA) 30 MG capsule Take 1 capsule (30 mg total) by mouth daily. (Patient not taking: Reported on 09/06/2022) 30 capsule 3   lidocaine (LIDODERM) 5 % Place 1 patch onto the skin daily. Remove & Discard patch within 12 hours or as directed by MD (Patient not taking: Reported on 06/29/2022) 30 patch 2   ondansetron (ZOFRAN) 4 MG tablet TAKE 2 TABLETS (8MG ) BY MOUTH ONCE EVERY 8 HOURS AS NEEDED FOR NAUSEA OR VOMITING. (Patient not taking: Reported on 01/31/2023) 80 tablet 1   prochlorperazine (COMPAZINE) 10 MG tablet Take 1 tablet (10 mg total) by mouth once every 6 (six) hours as needed for nausea or vomiting. (Patient not taking:  Reported on 01/31/2023) 40 tablet 1   No current facility-administered medications for this visit.   Facility-Administered Medications Ordered in Other Visits  Medication Dose Route Frequency Provider Last Rate Last Admin   famotidine (PEPCID) IVPB 20 mg premix  20 mg Intravenous Once Louretta Shorten R, MD 200 mL/hr at 02/11/23 1014 20 mg at 02/11/23 1014   heparin lock flush 100 UNIT/ML injection            heparin lock flush 100 unit/mL  500 Units Intravenous Once Louretta Shorten R, MD       heparin lock flush 100 unit/mL  500 Units Intracatheter Once PRN Earna Coder, MD       ipilimumab (YERVOY) 60 mg in sodium chloride 0.9 % 25 mL chemo infusion  1 mg/kg (Treatment Plan Recorded) Intravenous Once Earna Coder, MD       morphine (PF) 2 MG/ML injection            nivolumab (OPDIVO) 180 mg in sodium chloride 0.9 % 100 mL chemo infusion  2.92 mg/kg (Treatment Plan Recorded) Intravenous Once Louretta Shorten R, MD       sodium chloride flush (NS) 0.9 % injection 10 mL  10 mL Intravenous PRN Earna Coder, MD   10 mL at 03/01/22 0857      .  PHYSICAL EXAMINATION: ECOG PERFORMANCE STATUS: 0 - Asymptomatic  Vitals:   02/11/23 0905  BP: 115/71  Pulse: 93  Temp: 97.8 F (36.6 C)  SpO2: 95%       Filed Weights   02/11/23 0905  Weight: 133 lb 9.6 oz (60.6 kg)   Hepatomegaly noted.   Physical Exam HENT:     Head: Normocephalic and atraumatic.     Mouth/Throat:     Pharynx: No oropharyngeal exudate.  Eyes:  Pupils: Pupils are equal, round, and reactive to light.  Cardiovascular:     Rate and Rhythm: Normal rate and regular rhythm.  Pulmonary:     Effort: Pulmonary effort is normal. No respiratory distress.     Breath sounds: Normal breath sounds. No wheezing.  Abdominal:     General: Bowel sounds are normal. There is no distension.     Palpations: Abdomen is soft. There is no mass.     Tenderness: There is no abdominal tenderness.  There is no guarding or rebound.  Musculoskeletal:        General: No tenderness. Normal range of motion.     Cervical back: Normal range of motion and neck supple.  Skin:    General: Skin is warm.  Neurological:     Mental Status: She is alert and oriented to person, place, and time.  Psychiatric:        Mood and Affect: Affect normal.      LABORATORY DATA:  I have reviewed the data as listed Lab Results  Component Value Date   WBC 6.3 02/11/2023   HGB 11.6 (L) 02/11/2023   HCT 34.9 (L) 02/11/2023   MCV 90.6 02/11/2023   PLT 309 02/11/2023   Recent Labs    01/10/23 1016 01/31/23 0857 02/11/23 0910  NA 137 137 136  K 3.6 3.6 3.4*  CL 106 104 101  CO2 24 24 26   GLUCOSE 96 93 92  BUN 11 9 10   CREATININE 0.47 0.45 0.63  CALCIUM 9.0 9.1 9.6  GFRNONAA >60 >60 >60  PROT 7.2 7.2 7.7  ALBUMIN 3.4* 3.5 3.7  AST 15 17 22   ALT 8 8 9   ALKPHOS 114 119 118  BILITOT 0.3 0.2* 0.4    RADIOGRAPHIC STUDIES: I have personally reviewed the radiological images as listed and agreed with the findings in the report. CT CHEST ABDOMEN PELVIS W CONTRAST  Result Date: 01/28/2023 CLINICAL DATA:  Metastatic lung cancer restaging * Tracking Code: BO * EXAM: CT CHEST, ABDOMEN, AND PELVIS WITH CONTRAST TECHNIQUE: Multidetector CT imaging of the chest, abdomen and pelvis was performed following the standard protocol during bolus administration of intravenous contrast. RADIATION DOSE REDUCTION: This exam was performed according to the departmental dose-optimization program which includes automated exposure control, adjustment of the mA and/or kV according to patient size and/or use of iterative reconstruction technique. CONTRAST:  80mL OMNIPAQUE IOHEXOL 300 MG/ML  SOLN COMPARISON:  11/11/2022 FINDINGS: CT CHEST FINDINGS Cardiovascular: Right chest port catheter. Aortic atherosclerosis. Normal heart size. Three-vessel coronary artery calcifications. No pericardial effusion. Mediastinum/Nodes: No  enlarged mediastinal, hilar, or axillary lymph nodes. Thyroid gland, trachea, and esophagus demonstrate no significant findings. Lungs/Pleura: Severe emphysema and diffuse bilateral bronchial wall thickening. Status post right upper lobectomy. Bandlike scarring and volume loss of the paramedian right lung. Near complete resolution of a previously seen new nodule of the posterior left upper lobe, now with irregular ground-glass residual measuring 0.4 cm (series 3, image 80). New or at least significantly enlarged subpleural nodule of the peripheral right lower lobe, measuring 0.7 x 0.5 cm (series 3, image 101). No pleural effusion or pneumothorax. Musculoskeletal: No chest wall abnormality. No acute osseous findings. Unchanged mixed lytic and sclerotic osseous metastasis of the right scapular body (series 2, image 21). CT ABDOMEN PELVIS FINDINGS Hepatobiliary: Hepatic steatosis. Interval increase in size of bulky left lobe liver metastases, dominant mass occupying the majority of the left lobe measuring 12.3 x 10.6 cm, previously 9.7 x 8.7 cm (series 2,  image 64). No gallstones, gallbladder wall thickening, or biliary dilatation. Pancreas: Unremarkable. No pancreatic ductal dilatation or surrounding inflammatory changes. Spleen: Normal in size without significant abnormality. Adrenals/Urinary Tract: No significant change in a left adrenal nodule measuring 2.8 x 2.3 cm (series 2, image 61). Normal right adrenal gland. Kidneys are normal, without renal calculi, solid lesion, or hydronephrosis. Bladder is unremarkable. Stomach/Bowel: Stomach is within normal limits. Appendix not clearly visualized. No evidence of bowel wall thickening, distention, or inflammatory changes. Vascular/Lymphatic: Severe aortic atherosclerosis with a large burden of mural thrombus of the lower abdominal aorta (series 2, image 74). No enlarged abdominal or pelvic lymph nodes. Reproductive: Calcified uterine fibroids. Other: Small fat and fluid  containing epigastric hernia (series 2, image 71) no ascites. Musculoskeletal: No acute osseous findings. IMPRESSION: 1. Interval increase in size of bulky left lobe liver metastases, consistent with worsened hepatic metastatic disease. 2. Near complete resolution of a previously seen new nodule of the posterior left upper lobe, now with irregular ground-glass residual measuring 0.4 cm. This is nonspecific and may reflect resolution of infectious or inflammatory nodule. 3. New or at least significantly enlarged subpleural nodule of the peripheral right lower lobe, measuring 0.7 x 0.5 cm. This is also nonspecific but is worrisome for a small metastasis. 4. Unchanged mixed lytic and sclerotic metastasis of the right scapular body. No new osseous lesions identified. 5. Unchanged left adrenal nodule, previously characterized as as a benign adenoma. 6. Severe emphysema and diffuse bilateral bronchial wall thickening. 7. Coronary artery disease. Aortic Atherosclerosis (ICD10-I70.0) and Emphysema (ICD10-J43.9). Electronically Signed   By: Jearld Lesch M.D.   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 #  s/p  Lubrinectidin cycle #3- APRIL 19th CT -shows progressive disease in the liver; New or at least significantly enlarged subpleural nodule of the peripheral right lower lobe, measuring 0.7 x 0.5 cm. This is also nonspecific but is worrisome for a small metastasis. Unchanged mixed lytic and sclerotic metastasis of the right scapular body. No new osseous lesions identified.  #  Given STK-11/KEAPSAKE mutations-discussed regarding use of IPI-1+NIVO-3 every 3 weeks x 4 followed by annual by maintenance.  Patient  understands treatments are palliative not curative.  Chance of response rates about less than 20%.   # Proceed with cycle #1 of IPI+ NIVO today CBC CMP are reviewed.Awaiting TSH.   Again reviewed the side effects I discussed the mechanism of action; The goal of therapy is palliative; and length of treatments are likely ongoing/based upon the results of the scans. Discussed the potential side effects of immunotherapy including but not limited to diarrhea; skin rash; elevated LFTs/endocrine abnormalities etc.   # right foot drop sec to chemo-stable/improved- monitor for now. stable  # Mild Hypokalemia-  stable  # mets to bone/ Pain right shoulder- currently s/p RT [ 09/16/2021]; STABLE;  continue percocet 10 mg every 4-6 hours also. S/p  RT on Oct 16th , 2023-. 10 fractions. OFF  fentanyl patch; On oxycontin; and oxycodone as per palliative care.  Refilled by Sharia Reeve. Wants to Hold off IR ablation as pain is improved. Stable   # weight loss/ loss of apetite/ nausea- worse- recommend zyprexa qhs; add anti-emetic preemptively prn.   # constipation:  Miralax BID; fluids; Dulcolax- Stable   #  DM:  On Metformin-stable  # COPD: refilled albuterol; stable  #IV access: port functional- s/p TPA- functional  # DISPOSITION:  # IPI+NIVO today # follow up in 3 weeks- -  MD; port-labs- cbc/cmp;LDH-  IPI+NIVO- Dr.B     Earna Coder, MD 02/11/2023 10:16 AM

## 2023-02-11 NOTE — Progress Notes (Signed)
No concerns today 

## 2023-02-11 NOTE — Patient Instructions (Addendum)
[facility]  Discharge Instructions: Thank you for choosing [facility] to provide your oncology and hematology care.  If you have a lab appointment with the Cancer Center, please go directly to the Cancer Center and check in at the registration area.  Wear comfortable clothing and clothing appropriate for easy access to any Portacath or PICC line.   We strive to give you quality time with your provider. You may need to reschedule your appointment if you arrive late (15 or more minutes).  Arriving late affects you and other patients whose appointments are after yours.  Also, if you miss three or more appointments without notifying the office, you may be dismissed from the clinic at the provider's discretion.      For prescription refill requests, have your pharmacy contact our office and allow 72 hours for refills to be completed.    Today you received the following chemotherapy and/or immunotherapy agents- opdivo, yervoy       To help prevent nausea and vomiting after your treatment, we encourage you to take your nausea medication as directed.  BELOW ARE SYMPTOMS THAT SHOULD BE REPORTED IMMEDIATELY: *FEVER GREATER THAN 100.4 F (38 C) OR HIGHER *CHILLS OR SWEATING *NAUSEA AND VOMITING THAT IS NOT CONTROLLED WITH YOUR NAUSEA MEDICATION *UNUSUAL SHORTNESS OF BREATH *UNUSUAL BRUISING OR BLEEDING *URINARY PROBLEMS (pain or burning when urinating, or frequent urination) *BOWEL PROBLEMS (unusual diarrhea, constipation, pain near the anus) TENDERNESS IN MOUTH AND THROAT WITH OR WITHOUT PRESENCE OF ULCERS (sore throat, sores in mouth, or a toothache) UNUSUAL RASH, SWELLING OR PAIN  UNUSUAL VAGINAL DISCHARGE OR ITCHING   Items with * indicate a potential emergency and should be followed up as soon as possible or go to the Emergency Department if any problems should occur.  Please show the CHEMOTHERAPY ALERT CARD or IMMUNOTHERAPY ALERT CARD at  check-in to the Emergency Department and triage nurse.  Should you have questions after your visit or need to cancel or reschedule your appointment, please contact [facility]  [phone] and follow the prompts.  Office hours are 8:00 a.m. to 4:30 p.m. Monday - Friday. Please note that voicemails left after 4:00 p.m. may not be returned until the following business day.  We are closed weekends and major holidays. You have access to a nurse at all times for urgent questions. Please call the main number to the clinic [phone] and follow the prompts.  For any non-urgent questions, you may also contact your provider using MyChart. We now offer e-Visits for anyone 71 and older to request care online for non-urgent symptoms. For details visit [email or web address].   Also download the MyChart app! Go to the app store, search "MyChart", open the app, select [place], and log in with your MyChart username and password.  Nivolumab Injection What is this medication? NIVOLUMAB (nye VOL ue mab) treats some types of cancer. It works by helping your immune system slow or stop the spread of cancer cells. It is a monoclonal antibody. This medicine may be used for other purposes; ask your health care provider or pharmacist if you have questions. COMMON BRAND NAME(S): Opdivo What should I tell my care team before I take this medication? They need to know if you have any of these conditions: Allogeneic stem cell transplant (uses someone else's stem cells) Autoimmune diseases, such as Crohn disease, ulcerative colitis, lupus History of chest radiation Nervous system problems, such as Guillain-Barre syndrome or  myasthenia gravis Organ transplant An unusual or allergic reaction to nivolumab, other medications, foods, dyes, or preservatives Pregnant or trying to get pregnant Breast-feeding How should I use this medication? This medication is infused into a vein. It is  given in a hospital or clinic setting. A special MedGuide will be given to you before each treatment. Be sure to read this information carefully each time. Talk to your care team about the use of this medication in children. While it may be prescribed for children as young as 12 years for selected conditions, precautions do apply. Overdosage: If you think you have taken too much of this medicine contact a poison control center or emergency room at once. NOTE: This medicine is only for you. Do not share this medicine with others. What if I miss a dose? Keep appointments for follow-up doses. It is important not to miss your dose. Call your care team if you are unable to keep an appointment. What may interact with this medication? Interactions have not been studied. This list may not describe all possible interactions. Give your health care provider a list of all the medicines, herbs, non-prescription drugs, or dietary supplements you use. Also tell them if you smoke, drink alcohol, or use illegal drugs. Some items may interact with your medicine. What should I watch for while using this medication? Your condition will be monitored carefully while you are receiving this medication. You may need blood work while taking this medication. This medication may cause serious skin reactions. They can happen weeks to months after starting the medication. Contact your care team right away if you notice fevers or flu-like symptoms with a rash. The rash may be red or purple and then turn into blisters or peeling of the skin. You may also notice a red rash with swelling of the face, lips, or lymph nodes in your neck or under your arms. Tell your care team right away if you have any change in your eyesight. Talk to your care team if you are pregnant or think you might be pregnant. A negative pregnancy test is required before starting this medication. A reliable form of contraception is recommended while taking this  medication and for 5 months after the last dose. Talk to your care team about effective forms of contraception. Do not breast-feed while taking this medication and for 5 months after the last dose. What side effects may I notice from receiving this medication? Side effects that you should report to your care team as soon as possible: Allergic reactions--skin rash, itching, hives, swelling of the face, lips, tongue, or throat Dry cough, shortness of breath or trouble breathing Eye pain, redness, irritation, or discharge with blurry or decreased vision Heart muscle inflammation--unusual weakness or fatigue, shortness of breath, chest pain, fast or irregular heartbeat, dizziness, swelling of the ankles, feet, or hands Hormone gland problems--headache, sensitivity to light, unusual weakness or fatigue, dizziness, fast or irregular heartbeat, increased sensitivity to cold or heat, excessive sweating, constipation, hair loss, increased thirst or amount of urine, tremors or shaking, irritability Infusion reactions--chest pain, shortness of breath or trouble breathing, feeling faint or lightheaded Kidney injury (glomerulonephritis)--decrease in the amount of urine, red or dark brown urine, foamy or bubbly urine, swelling of the ankles, hands, or feet Liver injury--right upper belly pain, loss of appetite, nausea, light-colored stool, dark yellow or brown urine, yellowing skin or eyes, unusual weakness or fatigue Pain, tingling, or numbness in the hands or feet, muscle weakness, change in vision, confusion  or trouble speaking, loss of balance or coordination, trouble walking, seizures Rash, fever, and swollen lymph nodes Redness, blistering, peeling, or loosening of the skin, including inside the mouth Sudden or severe stomach pain, bloody diarrhea, fever, nausea, vomiting Side effects that usually do not require medical attention (report these to your care team if they continue or are bothersome): Bone,  joint, or muscle pain Diarrhea Fatigue Loss of appetite Nausea Skin rash This list may not describe all possible side effects. Call your doctor for medical advice about side effects. You may report side effects to FDA at 1-800-FDA-1088. Where should I keep my medication? This medication is given in a hospital or clinic. It will not be stored at home. NOTE: This sheet is a summary. It may not cover all possible information. If you have questions about this medicine, talk to your doctor, pharmacist, or health care provider.  2023 Elsevier/Gold Standard (2014-08-19 00:00:00)  Ipilimumab Injection What is this medication? IPILIMUMAB (IP i LIM ue mab) treats some types of cancer. It works by helping your immune system slow or stop the spread of cancer cells. It is a monoclonal antibody. This medicine may be used for other purposes; ask your health care provider or pharmacist if you have questions. COMMON BRAND NAME(S): YERVOY What should I tell my care team before I take this medication? They need to know if you have any of these conditions: Allogeneic stem cell transplant (uses someone else's stem cells) Autoimmune diseases, such as Crohn disease, ulcerative colitis, lupus Nervous system problems, such as Guillain-Barre syndrome or myasthenia gravis Organ transplant An unusual or allergic reaction to ipilimumab, other medications, foods, dyes, or preservatives Pregnant or trying to get pregnant Breast-feeding How should I use this medication? This medication is infused into a vein. It is given by your care team in a hospital or clinic setting. A special MedGuide will be given to you before each treatment. Be sure to read this information carefully each time. Talk to your care team about the use of this medication in children. While it may be prescribed for children as young as 12 years for selected conditions, precautions do apply. Overdosage: If you think you have taken too much of this  medicine contact a poison control center or emergency room at once. NOTE: This medicine is only for you. Do not share this medicine with others. What if I miss a dose? Keep appointments for follow-up doses. It is important not to miss your dose. Call your care team if you are unable to keep an appointment. What may interact with this medication? Interactions are not expected. This list may not describe all possible interactions. Give your health care provider a list of all the medicines, herbs, non-prescription drugs, or dietary supplements you use. Also tell them if you smoke, drink alcohol, or use illegal drugs. Some items may interact with your medicine. What should I watch for while using this medication? Your condition will be monitored carefully while you are receiving this medication. You may need blood work while taking this medication. This medication may cause serious skin reactions. They can happen weeks to months after starting the medication. Contact your care team right away if you notice fevers or flu-like symptoms with a rash. The rash may be red or purple and then turn into blisters or peeling of the skin. You may also notice a red rash with swelling of the face, lips, or lymph nodes in your neck or under your arms. Tell your care team  right away if you have any change in your eyesight. Talk to your care team if you may be pregnant. Serious birth defects can occur if you take this medication during pregnancy and for 3 months after the last dose. You will need a negative pregnancy test before starting this medication. Contraception is recommended while taking this medication and for 3 months after the last dose. Your care team can help you find the option that works for you. Do not breastfeed while taking this medication and for 3 months after the last dose. What side effects may I notice from receiving this medication? Side effects that you should report to your care team as soon as  possible: Allergic reactions--skin rash, itching, hives, swelling of the face, lips, tongue, or throat Dry cough, shortness of breath or trouble breathing Eye pain, redness, irritation, or discharge with blurry or decreased vision Heart muscle inflammation--unusual weakness or fatigue, shortness of breath, chest pain, fast or irregular heartbeat, dizziness, swelling of the ankles, feet, or hands Hormone gland problems--headache, sensitivity to light, unusual weakness or fatigue, dizziness, fast or irregular heartbeat, increased sensitivity to cold or heat, excessive sweating, constipation, hair loss, increased thirst or amount of urine, tremors or shaking, irritability Infusion reactions--chest pain, shortness of breath or trouble breathing, feeling faint or lightheaded Kidney injury (glomerulonephritis)--decrease in the amount of urine, red or dark brown urine, foamy or bubbly urine, swelling of the ankles, hands, or feet Liver injury--right upper belly pain, loss of appetite, nausea, light-colored stool, dark yellow or brown urine, yellowing skin or eyes, unusual weakness or fatigue Pain, tingling, or numbness in the hands or feet, muscle weakness, change in vision, confusion or trouble speaking, loss of balance or coordination, trouble walking, seizures Rash, fever, and swollen lymph nodes Redness, blistering, peeling, or loosening of the skin, including inside the mouth Sudden or severe stomach pain, bloody diarrhea, fever, nausea, vomiting Side effects that usually do not require medical attention (report to your care team if they continue or are bothersome): Bone, joint, or muscle pain Diarrhea Fatigue Loss of appetite Nausea Skin rash This list may not describe all possible side effects. Call your doctor for medical advice about side effects. You may report side effects to FDA at 1-800-FDA-1088. Where should I keep my medication? This medication is given in a hospital or clinic. It will  not be stored at home. NOTE: This sheet is a summary. It may not cover all possible information. If you have questions about this medicine, talk to your doctor, pharmacist, or health care provider.  2023 Elsevier/Gold Standard (2022-02-09 00:00:00)

## 2023-02-12 LAB — T4: T4, Total: 9.8 ug/dL (ref 4.5–12.0)

## 2023-02-16 ENCOUNTER — Inpatient Hospital Stay: Payer: Medicaid Other | Admitting: Occupational Therapy

## 2023-02-17 ENCOUNTER — Inpatient Hospital Stay (HOSPITAL_BASED_OUTPATIENT_CLINIC_OR_DEPARTMENT_OTHER): Payer: Medicaid Other | Admitting: Hospice and Palliative Medicine

## 2023-02-17 ENCOUNTER — Other Ambulatory Visit: Payer: Self-pay

## 2023-02-17 ENCOUNTER — Other Ambulatory Visit: Payer: Self-pay | Admitting: *Deleted

## 2023-02-17 DIAGNOSIS — G893 Neoplasm related pain (acute) (chronic): Secondary | ICD-10-CM | POA: Diagnosis not present

## 2023-02-17 DIAGNOSIS — C3411 Malignant neoplasm of upper lobe, right bronchus or lung: Secondary | ICD-10-CM

## 2023-02-17 MED ORDER — OXYCODONE-ACETAMINOPHEN 10-325 MG PO TABS
1.0000 | ORAL_TABLET | ORAL | 0 refills | Status: DC | PRN
  Filled 2023-02-17: qty 90, 8d supply, fill #0

## 2023-02-17 NOTE — Progress Notes (Signed)
Virtual Visit via Telephone Note  I connected with Stacie Kennedy on 02/17/23 at 11:30 AM EDT by telephone and verified that I am speaking with the correct person using two identifiers.  Location: Patient: Home Provider: Clinic   I discussed the limitations, risks, security and privacy concerns of performing an evaluation and management service by telephone and the availability of in person appointments. I also discussed with the patient that there may be a patient responsible charge related to this service. The patient expressed understanding and agreed to proceed.   History of Present Illness: Stacie Kennedy is a 62 y.o. 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  Patient has had severe pain with scapular metastases and was started on XRT.  She is referred to palliative care to help address goals and manage ongoing symptoms.    Observations/Objective: I spoke with patient by phone.  She reports she is doing reasonably well.  She denies any acute changes or concerns.  No new symptomatic complaints.  Patient continues to endorse generalized pain.  However, she is has recently she has had more good days and bad.  On a bad day, she says that she requires the Percocet 1 tablet every 4 hours around-the-clock.  She request refill of the Percocet today.  Denies any adverse effects from pain medications.  Assessment and Plan: Stage IV lung cancer -on immunotherapy  Neoplasm related pain -continue OxyContin.  Refill Percocet.  PDMP reviewed.  Follow Up Instructions: Follow-up telephone   I discussed the assessment and treatment plan with the patient. The patient was provided an opportunity to ask questions and all were answered. The patient agreed with the plan and demonstrated an understanding of the  instructions.   The patient was advised to call back or seek an in-person evaluation if the symptoms worsen or if the condition fails to improve as anticipated.  I provided 10 minutes of non-face-to-face time during this encounter.   Malachy Moan, NP

## 2023-02-18 ENCOUNTER — Other Ambulatory Visit: Payer: Self-pay

## 2023-02-23 ENCOUNTER — Inpatient Hospital Stay: Payer: Medicaid Other | Admitting: Occupational Therapy

## 2023-02-23 DIAGNOSIS — M21371 Foot drop, right foot: Secondary | ICD-10-CM

## 2023-02-23 NOTE — Therapy (Signed)
[facility] at [facility] [address] [place], [place], [address] Phone: [phone]   Fax:  [phone]  Occupational Therapy Screen  Patient Details  Name: Stacie Kennedy MRN: [id number] Date of Birth: Feb 05, 1961 No data recorded  Encounter Date: 02/23/2023   OT End of Session - 02/23/23 1712     Visit Number 0             Past Medical History:  Diagnosis Date   Cancer [facility])    lung cancer   Depression    Duodenitis    Dyspnea    Family history of cancer    High cholesterol    Personal history of colonic polyps    Pre-diabetes    Umbilical hernia    UTI (urinary tract infection)     Past Surgical History:  Procedure Laterality Date   COLONOSCOPY WITH PROPOFOL N/A 03/24/2021   Procedure: COLONOSCOPY WITH PROPOFOL;  Surgeon: Wyline Mood, MD;  Location: [facility] ENDOSCOPY;  Service: Gastroenterology;  Laterality: N/A;   INTERCOSTAL NERVE BLOCK  09/19/2020   Procedure: INTERCOSTAL NERVE BLOCK;  Surgeon: Loreli Slot, MD;  Location: [facility] OR;  Service: Thoracic;;   IR IMAGING GUIDED PORT INSERTION  09/23/2021   LUNG LOBECTOMY Right    LUNG REMOVAL, PARTIAL Right 09/19/2020   NODE DISSECTION Right 09/19/2020   Procedure: NODE DISSECTION;  Surgeon: Loreli Slot, MD;  Location: [facility] OR;  Service: Thoracic;  Laterality: Right;   VIDEO BRONCHOSCOPY N/A 07/08/2021   Procedure: VIDEO BRONCHOSCOPY;  Surgeon: Loreli Slot, MD;  Location: [facility] OR;  Service: Thoracic;  Laterality: N/A;    There were no vitals filed for this visit.   Subjective Assessment - 02/23/23 1711     Subjective  My right foot drop was worse.  The last month it got much better.  I have feeling no numbness.  Did not had any falls.    Currently in Pain? 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 #  s/p  Lubrinectidin cycle #3- APRIL 19th CT -shows progressive disease in the liver; New or at least significantly enlarged subpleural nodule of the peripheral right lower lobe, measuring 0.7 x 0.5 cm. This is also nonspecific but is worrisome for a small metastasis. Unchanged mixed lytic and sclerotic metastasis of the right scapular body. No new osseous lesions identified.   #  Given STK-11/KEAPSAKE mutations-discussed regarding use of IPI-1+NIVO-3 every 3 weeks x 4 followed by annual by maintenance.  Patient understands treatments are palliative not curative.  Chance of response rates about less than 20%.    # Proceed with cycle #1 of IPI+ NIVO today CBC CMP are reviewed.Awaiting TSH.    Again reviewed the side effects I discussed the mechanism of action; The goal of therapy is palliative; and length of treatments are likely ongoing/based upon the results of the scans. Discussed the potential side effects of immunotherapy including but not limited to diarrhea; skin rash; elevated LFTs/endocrine abnormalities etc.    # right foot drop sec to chemo-stable/improved- monitor for now. stable   # Mild Hypokalemia-  stable   # mets to bone/ Pain right shoulder- currently s/p RT [  09/16/2021]; STABLE;  continue percocet 10 mg every 4-6 hours also. S/p  RT on Oct 16th , 2023-. 10 fractions. OFF  fentanyl patch; On oxycontin; and oxycodone as per palliative care.  Refilled by Sharia Reeve. Wants to Hold off IR ablation as pain is improved. Stable    # weight loss/ loss of apetite/ nausea- worse- recommend zyprexa qhs; add anti-emetic preemptively prn.    # constipation:  Miralax BID; fluids; Dulcolax- Stable    # DM:  On Metformin-stable   # COPD: refilled albuterol; stable   #IV access: port functional- s/p  TPA- functional   # DISPOSITION:  # IPI+NIVO today # follow up in 3 weeks- -  MD; port-labs- cbc/cmp;LDH-  IPI+NIVO- Dr.B     OT SCREEN 01/24/23  Patient present at OT screen with a history of right foot drop.  Patient report improved greatly in the last month. Patient denies any falls. Patient able to do plantar and dorsiflex he Hip and knee strength within functional limits BERG balance test 56/56 risk for fall low  Provided patient with home exercises for calf stretch Prior to standing plantar and dorsiflexion as well as inversion extraversion of foot 12 reps to 3 times a day Sit and stand from regular height chair using her hands 12 reps 3 times a day Encourage patient also to walk and do sidestepping at the kitchen counter Recommend 150 minutes of activity and exercise a week. Can break it up and smaller sessions. Patient can follow-up with me as needed.                                  Visit Diagnosis: Foot drop, right    Problem List Patient Active Problem List   Diagnosis Date Noted   Neoplasm of right scapula 08/18/2021   Acute pain of right shoulder 07/14/2021   Cyst of bone of left hand 07/14/2021   Shortness of breath 07/14/2021   Genetic testing 05/27/2021   Family history of cancer 04/09/2021   Personal history of colonic polyps 04/09/2021   Health care maintenance 03/19/2021   Major depressive disorder 01/01/2021   Primary cancer of right upper lobe of lung (HCC) 11/03/2020   S/P lobectomy of lung 09/19/2020   Lung mass 08/28/2020   Encounter for screening colonoscopy 05/22/2020   Prediabetes 05/22/2020   Elevated lipids 05/22/2020   Stress incontinence of urine 05/22/2020   Encounter to establish care 04/24/2020   Urinary tract infection symptoms 04/24/2020   Smoking 04/24/2020    Oletta Cohn, OTR/L,CLT 02/23/2023, 5:12 PM   [facility] [address] [place], [place], [address] Phone: [phone]   Fax:  [phone]  Name: Stacie Kennedy MRN: [id number] Date of Birth: 11-Jun-1961

## 2023-03-03 ENCOUNTER — Other Ambulatory Visit: Payer: Self-pay

## 2023-03-03 ENCOUNTER — Other Ambulatory Visit: Payer: Self-pay | Admitting: *Deleted

## 2023-03-03 MED ORDER — OXYCODONE-ACETAMINOPHEN 10-325 MG PO TABS
1.0000 | ORAL_TABLET | ORAL | 0 refills | Status: DC | PRN
  Filled 2023-03-03: qty 90, 8d supply, fill #0

## 2023-03-03 MED ORDER — METFORMIN HCL 500 MG PO TABS
500.0000 mg | ORAL_TABLET | Freq: Every day | ORAL | 3 refills | Status: DC
  Filled 2023-03-03: qty 90, 90d supply, fill #0

## 2023-03-03 MED ORDER — OXYCODONE HCL ER 20 MG PO T12A
20.0000 mg | EXTENDED_RELEASE_TABLET | Freq: Two times a day (BID) | ORAL | 0 refills | Status: DC
  Filled 2023-03-03: qty 60, 30d supply, fill #0

## 2023-03-04 ENCOUNTER — Other Ambulatory Visit: Payer: Self-pay | Admitting: Internal Medicine

## 2023-03-04 ENCOUNTER — Inpatient Hospital Stay (HOSPITAL_BASED_OUTPATIENT_CLINIC_OR_DEPARTMENT_OTHER): Payer: Medicaid Other | Admitting: Internal Medicine

## 2023-03-04 ENCOUNTER — Encounter: Payer: Self-pay | Admitting: Internal Medicine

## 2023-03-04 ENCOUNTER — Inpatient Hospital Stay: Payer: Medicaid Other

## 2023-03-04 ENCOUNTER — Other Ambulatory Visit: Payer: Self-pay

## 2023-03-04 VITALS — BP 113/71 | HR 93 | Temp 97.6°F | Ht 67.0 in | Wt 134.8 lb

## 2023-03-04 DIAGNOSIS — R7303 Prediabetes: Secondary | ICD-10-CM

## 2023-03-04 DIAGNOSIS — C3411 Malignant neoplasm of upper lobe, right bronchus or lung: Secondary | ICD-10-CM

## 2023-03-04 DIAGNOSIS — Z5112 Encounter for antineoplastic immunotherapy: Secondary | ICD-10-CM | POA: Diagnosis not present

## 2023-03-04 LAB — CBC WITH DIFFERENTIAL (CANCER CENTER ONLY)
Abs Immature Granulocytes: 0.01 10*3/uL (ref 0.00–0.07)
Basophils Absolute: 0 10*3/uL (ref 0.0–0.1)
Basophils Relative: 1 %
Eosinophils Absolute: 0.1 10*3/uL (ref 0.0–0.5)
Eosinophils Relative: 1 %
HCT: 33.7 % — ABNORMAL LOW (ref 36.0–46.0)
Hemoglobin: 11.4 g/dL — ABNORMAL LOW (ref 12.0–15.0)
Immature Granulocytes: 0 %
Lymphocytes Relative: 26 %
Lymphs Abs: 1.7 10*3/uL (ref 0.7–4.0)
MCH: 30.4 pg (ref 26.0–34.0)
MCHC: 33.8 g/dL (ref 30.0–36.0)
MCV: 89.9 fL (ref 80.0–100.0)
Monocytes Absolute: 0.8 10*3/uL (ref 0.1–1.0)
Monocytes Relative: 13 %
Neutro Abs: 3.9 10*3/uL (ref 1.7–7.7)
Neutrophils Relative %: 59 %
Platelet Count: 272 10*3/uL (ref 150–400)
RBC: 3.75 MIL/uL — ABNORMAL LOW (ref 3.87–5.11)
RDW: 18.8 % — ABNORMAL HIGH (ref 11.5–15.5)
WBC Count: 6.4 10*3/uL (ref 4.0–10.5)
nRBC: 0 % (ref 0.0–0.2)

## 2023-03-04 LAB — CMP (CANCER CENTER ONLY)
ALT: 11 U/L (ref 0–44)
AST: 25 U/L (ref 15–41)
Albumin: 3.7 g/dL (ref 3.5–5.0)
Alkaline Phosphatase: 165 U/L — ABNORMAL HIGH (ref 38–126)
Anion gap: 13 (ref 5–15)
BUN: 11 mg/dL (ref 8–23)
CO2: 23 mmol/L (ref 22–32)
Calcium: 9.4 mg/dL (ref 8.9–10.3)
Chloride: 99 mmol/L (ref 98–111)
Creatinine: 0.49 mg/dL (ref 0.44–1.00)
GFR, Estimated: >60 mL/min (ref >60)
Glucose, Bld: 90 mg/dL (ref 70–99)
Potassium: 3.6 mmol/L (ref 3.5–5.1)
Sodium: 135 mmol/L (ref 135–145)
Total Bilirubin: 0.4 mg/dL (ref 0.3–1.2)
Total Protein: 7.6 g/dL (ref 6.5–8.1)

## 2023-03-04 LAB — LACTATE DEHYDROGENASE: LDH: 345 U/L — ABNORMAL HIGH (ref 98–192)

## 2023-03-04 MED ORDER — FAMOTIDINE IN NACL 20-0.9 MG/50ML-% IV SOLN: 20.0000 mg | Freq: Once | INTRAVENOUS | Status: DC

## 2023-03-04 MED ORDER — DIPHENHYDRAMINE HCL 50 MG/ML IJ SOLN
25.0000 mg | Freq: Once | INTRAMUSCULAR | Status: AC
  Administered 2023-03-04: 25 mg via INTRAVENOUS
  Filled 2023-03-04: qty 1

## 2023-03-04 MED ORDER — METFORMIN HCL 500 MG PO TABS
500.0000 mg | ORAL_TABLET | Freq: Every day | ORAL | 3 refills | Status: DC
Start: 2023-03-04 — End: 2023-08-28
  Filled 2023-03-04: qty 90, 90d supply, fill #0
  Filled 2023-07-11: qty 90, 90d supply, fill #1

## 2023-03-04 MED ORDER — SODIUM CHLORIDE 0.9 % IV SOLN
2.9200 mg/kg | Freq: Once | INTRAVENOUS | Status: AC
  Administered 2023-03-04: 180 mg via INTRAVENOUS
  Filled 2023-03-04: qty 18

## 2023-03-04 MED ORDER — FAMOTIDINE 20 MG IN NS 100 ML IVPB
20.0000 mg | Freq: Once | INTRAVENOUS | Status: AC
  Administered 2023-03-04: 20 mg via INTRAVENOUS
  Filled 2023-03-04: qty 20

## 2023-03-04 MED ORDER — SODIUM CHLORIDE 0.9 % IV SOLN
Freq: Once | INTRAVENOUS | Status: AC
  Filled 2023-03-04: qty 250

## 2023-03-04 MED ORDER — IPILIMUMAB CHEMO INJECTION 200 MG/40ML: 1.0000 mg/kg | Freq: Once | INTRAVENOUS | Status: DC

## 2023-03-04 MED ORDER — HEPARIN SOD (PORK) LOCK FLUSH 100 UNIT/ML IV SOLN
500.0000 [IU] | Freq: Once | INTRAVENOUS | Status: AC | PRN
  Administered 2023-03-04: 500 [IU]
  Filled 2023-03-04: qty 5

## 2023-03-04 NOTE — Progress Notes (Signed)
Confirmed w/ Dr. Donneta Romberg - pt will only receive Nivolumab today.  Ipilimumab is not due.  Ebony Hail, Pharm.D., CPP 03/04/2023@11 :03 AM

## 2023-03-04 NOTE — Progress Notes (Signed)
Nutrition Follow-up:  Last seen by RD on 11/26/22   Reconsulted for poor appetite.  62 year old female with recurrent lung cancer.  Patient receiving immunotherapy.  Met with patient during infusion.  Patient reports decreased appetite. Sometimes does not eat breakfast but if she does will eat cereal or oatmeal.  Lunch is a sandwich or chicken or leftovers.  Dinner maybe a hamburger and fries from [employer] or pork chops and vegetables.  Drinks glucerna shakes at times.  Tearful during visit.      Medications: zyprexa  Labs: reviewed  Anthropometrics:   Weight 134 lb 12.8 oz on 5/24  137 lb 3.2 oz on 3/8 140 lb 9.6 oz on 2/16  4% weight loss in the last 3 months  NUTRITION DIAGNOSIS:  Inadequate oral intake related to cancer and related treatment side effects as evidenced by     INTERVENTION:  Encouraged high calorie, high protein diet. Discussed ways to add calories and protein in diet Encouraged nibbling/snacking q 2 hours     MONITORING, EVALUATION, GOAL: weight trends, intake   NEXT VISIT: Friday, June 14 during infusion  Ehsan Corvin B. Freida Busman, RD, LDN Registered Dietitian [phone]

## 2023-03-04 NOTE — Patient Instructions (Addendum)
[facility] [place] [facility]  Discharge Instructions: Thank you for choosing [facility] to provide your oncology and hematology care.  If you have a lab appointment with the Cancer Center, please go directly to the Cancer Center and check in at the registration area.  Wear comfortable clothing and clothing appropriate for easy access to any Portacath or PICC line.   We strive to give you quality time with your provider. You may need to reschedule your appointment if you arrive late (15 or more minutes).  Arriving late affects you and other patients whose appointments are after yours.  Also, if you miss three or more appointments without notifying the office, you may be dismissed from the clinic at the provider's discretion.      For prescription refill requests, have your pharmacy contact our office and allow 72 hours for refills to be completed.    Today you received the following chemotherapy and/or immunotherapy agents Opdivo      To help prevent nausea and vomiting after your treatment, we encourage you to take your nausea medication as directed.  BELOW ARE SYMPTOMS THAT SHOULD BE REPORTED IMMEDIATELY: *FEVER GREATER THAN 100.4 F (38 C) OR HIGHER *CHILLS OR SWEATING *NAUSEA AND VOMITING THAT IS NOT CONTROLLED WITH YOUR NAUSEA MEDICATION *UNUSUAL SHORTNESS OF BREATH *UNUSUAL BRUISING OR BLEEDING *URINARY PROBLEMS (pain or burning when urinating, or frequent urination) *BOWEL PROBLEMS (unusual diarrhea, constipation, pain near the anus) TENDERNESS IN MOUTH AND THROAT WITH OR WITHOUT PRESENCE OF ULCERS (sore throat, sores in mouth, or a toothache) UNUSUAL RASH, SWELLING OR PAIN  UNUSUAL VAGINAL DISCHARGE OR ITCHING   Items with * indicate a potential emergency and should be followed up as soon as possible or go to the Emergency Department if any problems should occur.  Please show the CHEMOTHERAPY ALERT CARD or IMMUNOTHERAPY ALERT CARD at check-in to the  Emergency Department and triage nurse.  Should you have questions after your visit or need to cancel or reschedule your appointment, please contact [facility] [place] [facility]  [phone] and follow the prompts.  Office hours are 8:00 a.m. to 4:30 p.m. Monday - Friday. Please note that voicemails left after 4:00 p.m. may not be returned until the following business day.  We are closed weekends and major holidays. You have access to a nurse at all times for urgent questions. Please call the main number to the clinic [phone] and follow the prompts.  For any non-urgent questions, you may also contact your provider using MyChart. We now offer e-Visits for anyone 18 and older to request care online for non-urgent symptoms. For details visit [email or web address].   Also download the MyChart app! Go to the app store, search "MyChart", open the app, select [facility], and log in with your MyChart username and password.    

## 2023-03-04 NOTE — Assessment & Plan Note (Addendum)
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 #  s/p  Lubrinectidin cycle #3- APRIL 19th CT -shows progressive disease in the liver; New or at least significantly enlarged subpleural nodule of the peripheral right lower lobe, measuring 0.7 x 0.5 cm. This is also nonspecific but is worrisome for a small metastasis. Unchanged mixed lytic and sclerotic metastasis of the right scapular body. No new osseous lesions identified . STK-11/KEAPSAKE mutations-discussed regarding use of IPI-1+NIVO-3 every 3 weeks x 4 followed by annual by maintenance.   # Patient tolerating treatment well.  Proceed with cycle #2 of IPI+ NIVO today CBC CMP are reviewed. APRIL  TSH- WNL.  Discussed that we will plan to have a repeat scan after 3 cycles of treatment.  Will order next week.  # right foot drop sec to chemo-stable/improved- monitor for now. stable.  # Mild Hypokalemia-  stable. .   # mets to bone/ Pain right shoulder- currently s/p RT [ 09/16/2021]; STABLE;  continue percocet 10 mg every 4-6 hours also. S/p  RT on Oct 16th , 2023-. 10 fractions. OFF  fentanyl patch; On oxycontin; and oxycodone as per palliative care.  Refilled by Sharia Reeve. Wants to Hold off IR ablation as pain is improved. stable. .   # weight loss/ loss of apetite/ nausea- on zyprexa qhs; add anti-emetic preemptively prn-   # constipation:  Miralax BID; fluids; Dulcolax- stable   # DM:  On Metformin-stable  # COPD: refilled albuterol; stable  #IV access: port functional- s/p TPA- functional  IPI q6W+ NIVOq3W # DISPOSITION:  # IPI ONLY # follow up in 3 weeks- -  MD; port-labs- cbc/cmp;LDH-Thyroid profile-  IPI+NIVO- Dr.B

## 2023-03-04 NOTE — Progress Notes (Signed)
Needs refill on metformin

## 2023-03-04 NOTE — Progress Notes (Signed)
[facility]  Patient Care Team: Earna Coder, MD as PCP - General (Internal Medicine) Glory Buff, RN as Oncology Nurse Navigator  CHIEF COMPLAINTS/PURPOSE OF CONSULTATION: lung cancer  #  Oncology History Overview Note  # NOV -[id number] [facility] CANCER SCREENING PROGRAM]-18 mm right upper lobe lung nodule; DEC 2021- s/p right upper lobectomy [Dr. Dorris Fetch; [place]]; STAGE: I [pT-18 mm; LN-12=0]; predominant large cell neuroendocrine; minor adenocarcinoma.  DeclineD adjuvant chemotherapy.  #Recurrent/stage IV cancer NOV 2022- PET scan-scapular lesion liver lesion gastrohepatic lymphadenopathy.  11/21- start RT to right scapular lesion  # DEC 3rd, 2022- CARBO=ETOP+TECEN; udenyca #1; SEP 2023- STOP Tencetriq- progression  SEP 2023-liver biopsy shows neuroendocrine carcinoma/large cell; no adenocarcinoma noted.  Discontinue Tecentriq any progression of disease.  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   # OCTOBER, 2023-s /p carboplatin etoposide x4 cycles- without immunotherapy [progressed while on Atezo].  CT scan FEB 1st, 2024- Two new solid pulmonary nodules, largest 0.8 cm in the posterior left upper lobe, suspicious for new pulmonary metastases. Two enlarging left liver metastases, including substantial growth of the dominant bulky 9.7 cm segment 3 left liver metastasis. Stable expansile mixed lytic and sclerotic right scapular body metastasis.  # FEB 2024- Lubrinectidin cycle #3-progressive disease-noted; discontinue Lubrinectidin.   # MAY 3rd, 2024- IPI-1+NIVO-3 [KEPASAKE]- cycle #1-   # # MAY 2023- Right swollen eye/orbital inflammation/uveitis s/p Zometa status post steroids improved.-reviewed literature case reports noted; less concerning for tumor involvement.  DISCONTINUE ZOMETA.  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  Visceral Pleura Invasion: Not identified  Direct Invasion of Adjacent Structures: No adjacent structures present  Lymphovascular Invasion: Not identified  Margins: All margins negative for invasive carcinoma       Closest Margin(s) to Invasive Carcinoma: Bronchovascular margin  Treatment Effect: No known presurgical therapy  Regional Lymph Nodes:       Number of Lymph Nodes Involved: 0                            Nodal Sites with Tumor: Not applicable       Number of Lymph Nodes Examined: 12      Primary cancer of right upper lobe of lung (HCC)  11/03/2020 Initial Diagnosis   Primary cancer of right upper lobe of lung (HCC)   11/03/2020 Cancer Staging   Staging form: Lung, AJCC 8th Edition - Pathologic: Stage IA3 (pT1c, pN0, cM0) - Signed by Earna Coder, MD on 11/04/2020   09/14/2021 - 04/30/2022 Chemotherapy   Patient is on Treatment Plan : LUNG SCLC Carboplatin + Etoposide + Atezolizumab Induction q21d / Atezolizumab Maintenance q21d     09/15/2021 - 06/18/2022 Chemotherapy   Patient is on Treatment Plan : LUNG SCLC Carboplatin + Etoposide + Atezolizumab Induction q21d x 4 cycles / Atezolizumab Maintenance q21d     11/09/2021 Cancer Staging   Staging form: Lung, AJCC 8th Edition - Pathologic: Stage IVB (pTX, pNX, cM1c) - Signed by Earna Coder, MD on 11/09/2021   06/22/2022 Pathology Results   Liver biopsy showed metastatic cancer with neuroendocrine features  08/09/2022 - 10/22/2022 Chemotherapy   Patient is on Treatment Plan : BRAIN Carboplatin (AUC 5) D1 + Etoposide (120) D1-3 q28d     11/26/2022 - 01/10/2023 Chemotherapy   Patient is on Treatment Plan : LUNG SMALL CELL Lurbinectedin q21d     02/11/2023 -  Chemotherapy    Patient is on Treatment Plan : MELANOMA Nivolumab (3) + Ipilimumab (1) q21d / Nivolumab (240) q14d      HISTORY OF PRESENTING ILLNESS: Ambulating independently.  Alone.   Stacie Kennedy 62 y.o.  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   Currently s/p cycle #1 IPI+NIVO.  No nausea/ vomiting or diarrhea. NO skin rash.   Patient continues to complain of abdominal discomfort/tightness.  Intermittent constipation. Patient continues to have shoulder pain which is better; currently on oxycontin BID; and percocets up to 3-4 day. Otherwise patient states pain is well controlled on current medication regimen.   Chronic shortness of breath-not any worse.   Review of Systems  Constitutional:  Positive for malaise/fatigue and weight loss. Negative for chills, diaphoresis and fever.  HENT:  Negative for nosebleeds and sore throat.   Eyes:  Negative for double vision.  Respiratory:  Negative for cough, hemoptysis, sputum production, shortness of breath and wheezing.   Cardiovascular:  Negative for chest pain, palpitations, orthopnea and leg swelling.  Gastrointestinal:  Positive for nausea and vomiting. Negative for abdominal pain, blood in stool, constipation, diarrhea, heartburn and melena.  Genitourinary:  Negative for dysuria, frequency and urgency.  Musculoskeletal:  Positive for back pain, joint pain and myalgias.  Skin: Negative.  Negative for itching and rash.  Neurological:  Negative for dizziness, tingling, focal weakness, weakness and headaches.  Endo/Heme/Allergies:  Does not bruise/bleed easily.  Psychiatric/Behavioral:  Negative for depression. The patient is not nervous/anxious and does not have insomnia.      MEDICAL HISTORY:  Past Medical History:  Diagnosis Date   Cancer [facility])    lung cancer   Depression    Duodenitis    Dyspnea    Family history of cancer    High cholesterol    Personal  history of colonic polyps    Pre-diabetes    Umbilical hernia    UTI (urinary tract infection)     SURGICAL HISTORY: Past Surgical History:  Procedure Laterality Date   COLONOSCOPY WITH PROPOFOL N/A 03/24/2021   Procedure: COLONOSCOPY WITH PROPOFOL;  Surgeon: Wyline Mood, MD;  Location: [facility] ENDOSCOPY;  Service: Gastroenterology;  Laterality: N/A;   INTERCOSTAL NERVE BLOCK  09/19/2020   Procedure: INTERCOSTAL NERVE BLOCK;  Surgeon: Loreli Slot, MD;  Location: [facility] OR;  Service: Thoracic;;   IR IMAGING GUIDED PORT INSERTION  09/23/2021   LUNG LOBECTOMY Right    LUNG REMOVAL, PARTIAL Right 09/19/2020   NODE DISSECTION Right 09/19/2020   Procedure: NODE DISSECTION;  Surgeon: Loreli Slot, MD;  Location: [facility] OR;  Service: Thoracic;  Laterality: Right;   VIDEO BRONCHOSCOPY N/A 07/08/2021   Procedure: VIDEO BRONCHOSCOPY;  Surgeon: Loreli Slot, MD;  Location: [facility] OR;  Service: Thoracic;  Laterality: N/A;    SOCIAL HISTORY: Social History   Socioeconomic History   Marital status: Single    Spouse name: Not on file   Number of children: Not on file   Years of education: Not on file   Highest education level: Not on file  Occupational History   Not on file  Tobacco Use   Smoking status: Some Days  Packs/day: 0.25    Years: 43.00    Additional pack years: 0.00    Total pack years: 10.75    Types: Cigarettes   Smokeless tobacco: Never   Tobacco comments:    states she is slowing, trying to quit  Vaping Use   Vaping Use: Never used  Substance and Sexual Activity   Alcohol use: Yes    Alcohol/week: 2.0 standard drinks of alcohol    Types: 2 Cans of beer per week    Comment: occasional   Drug use: No   Sexual activity: Not on file  Other Topics Concern   Not on file  Social History Narrative   Lives in [place]; smokes; now and then beer; with boy friend. 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    Social Determinants of Health    Financial Resource Strain: High Risk (04/22/2022)   Overall Financial Resource Strain (CARDIA)    Difficulty of Paying Living Expenses: Hard  Food Insecurity: Food Insecurity Present (07/09/2021)   Hunger Vital Sign    Worried About Running Out of Food in the Last Year: Sometimes true    Ran Out of Food in the Last Year: Sometimes true  Transportation Needs: No Transportation Needs (07/09/2021)   PRAPARE - Administrator, Civil Service (Medical): No    Lack of Transportation (Non-Medical): No  Physical Activity: Insufficiently Active (04/16/2021)   Exercise Vital Sign    Days of Exercise per Week: 7 days    Minutes of Exercise per Session: 20 min  Stress: Stress Concern Present (04/22/2022)   [employer] of Occupational Health - Occupational Stress Questionnaire    Feeling of Stress : Very much  Social Connections: Socially Isolated (04/22/2022)   Social Connection and Isolation Panel [NHANES]    Frequency of Communication with Friends and Family: Once a week    Frequency of Social Gatherings with Friends and Family: Once a week    Attends Religious Services: Never    Database administrator or Organizations: No    Attends Banker Meetings: Never    Marital Status: Living with partner  Intimate Partner Violence: At Risk (04/22/2022)   Humiliation, Afraid, Rape, and Kick questionnaire    Fear of Current or Ex-Partner: No    Emotionally Abused: Yes    Physically Abused: No    Sexually Abused: No    FAMILY HISTORY: Family History  Problem Relation Age of Onset   Diabetes Mother    Cancer Mother    Diabetes Father    Stroke Father    Heart attack Father        6s   Healthy Sister    Cancer Brother    Hepatitis Brother    Diabetes Brother    Hypertension Brother    Heart disease Brother     ALLERGIES:  has No Known Allergies.  MEDICATIONS:  Current Outpatient Medications  Medication Sig Dispense Refill   albuterol (PROVENTIL HFA) 108 (90  Base) MCG/ACT inhaler Inhale 2 puffs into the lungs once every 6 (six) hours as needed for wheezing or shortness of breath. 18 g 2   lidocaine-prilocaine (EMLA) cream Apply one application topically the the affected area(s) daily as needed. 30 g 3   loratadine (CLARITIN) 10 MG tablet Take 1 tablet (10 mg total) by mouth daily. 30 tablet 2   naloxone (NARCAN) nasal spray 4 mg/0.1 mL SPRAY 1 SPRAY INTO ONE NOSTRIL AS DIRECTED FOR OPIOID OVERDOSE (TURN PERSON ON SIDE AFTER DOSE.  IF NO RESPONSE IN 2-3 MINUTES OR PERSON RESPONDS BUT RELAPSES, REPEAT USING A NEW SPRAY DEVICE AND SPRAY INTO THE OTHER NOSTRIL. CALL 911 AFTER USE.) * EMERGENCY USE ONLY * 2 each 0   OLANZapine (ZYPREXA) 5 MG tablet Take 1 tablet (5 mg total) by mouth at bedtime. 30 tablet 3   ondansetron (ZOFRAN) 4 MG tablet TAKE 2 TABLETS (8MG ) BY MOUTH ONCE EVERY 8 HOURS AS NEEDED FOR NAUSEA OR VOMITING. 80 tablet 1   oxyCODONE (OXYCONTIN) 20 mg 12 hr tablet Take 1 tablet (20 mg total) by mouth every 12 (twelve) hours. 60 tablet 0   oxyCODONE-acetaminophen (PERCOCET) 10-325 MG tablet Take 1-2 tablets by mouth every 4 (four) hours as needed for pain. 90 tablet 0   potassium chloride SA (KLOR-CON M) 20 MEQ tablet Take 1 tablet (20 mEq total) by mouth 2 (two) times daily. 60 tablet 3   predniSONE (DELTASONE) 20 MG tablet Take 1 tablet (20 mg total) by mouth daily with breakfast. Once a day with food. 30 tablet 0   prochlorperazine (COMPAZINE) 10 MG tablet Take 1 tablet (10 mg total) by mouth once every 6 (six) hours as needed for nausea or vomiting. 40 tablet 1   DULoxetine (CYMBALTA) 30 MG capsule Take 1 capsule (30 mg total) by mouth daily. (Patient not taking: Reported on 09/06/2022) 30 capsule 3   lidocaine (LIDODERM) 5 % Place 1 patch onto the skin daily. Remove & Discard patch within 12 hours or as directed by MD (Patient not taking: Reported on 06/29/2022) 30 patch 2   metFORMIN (GLUCOPHAGE) 500 MG tablet Take 1 tablet (500 mg total) by  mouth daily with breakfast. 90 tablet 3   No current facility-administered medications for this visit.   Facility-Administered Medications Ordered in Other Visits  Medication Dose Route Frequency Provider Last Rate Last Admin   heparin lock flush 100 UNIT/ML injection            heparin lock flush 100 unit/mL  500 Units Intravenous Once Louretta Shorten R, MD       morphine (PF) 2 MG/ML injection            sodium chloride flush (NS) 0.9 % injection 10 mL  10 mL Intravenous PRN Earna Coder, MD   10 mL at 03/01/22 0857      .  PHYSICAL EXAMINATION: ECOG PERFORMANCE STATUS: 0 - Asymptomatic  Vitals:   03/04/23 0852  BP: 113/71  Pulse: 93  Temp: 97.6 F (36.4 C)  SpO2: 90%       Filed Weights   03/04/23 0852  Weight: 134 lb 12.8 oz (61.1 kg)   Hepatomegaly noted.   Physical Exam HENT:     Head: Normocephalic and atraumatic.     Mouth/Throat:     Pharynx: No oropharyngeal exudate.  Eyes:     Pupils: Pupils are equal, round, and reactive to light.  Cardiovascular:     Rate and Rhythm: Normal rate and regular rhythm.  Pulmonary:     Effort: Pulmonary effort is normal. No respiratory distress.     Breath sounds: Normal breath sounds. No wheezing.  Abdominal:     General: Bowel sounds are normal. There is no distension.     Palpations: Abdomen is soft. There is no mass.     Tenderness: There is no abdominal tenderness. There is no guarding or rebound.  Musculoskeletal:        General: No tenderness. Normal range of motion.     Cervical  back: Normal range of motion and neck supple.  Skin:    General: Skin is warm.  Neurological:     Mental Status: She is alert and oriented to person, place, and time.  Psychiatric:        Mood and Affect: Affect normal.      LABORATORY DATA:  I have reviewed the data as listed Lab Results  Component Value Date   WBC 6.4 03/04/2023   HGB 11.4 (L) 03/04/2023   HCT 33.7 (L) 03/04/2023   MCV 89.9 03/04/2023    PLT 272 03/04/2023   Recent Labs    01/31/23 0857 02/11/23 0910 03/04/23 0852  NA 137 136 135  K 3.6 3.4* 3.6  CL 104 101 99  CO2 24 26 23   GLUCOSE 93 92 90  BUN 9 10 11   CREATININE 0.45 0.63 0.49  CALCIUM 9.1 9.6 9.4  GFRNONAA >60 >60 >60  PROT 7.2 7.7 7.6  ALBUMIN 3.5 3.7 3.7  AST 17 22 25   ALT 8 9 11   ALKPHOS 119 118 165*  BILITOT 0.2* 0.4 0.4    RADIOGRAPHIC STUDIES: I have personally reviewed the radiological images as listed and agreed with the findings in the report. No results found.   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 #  s/p  Lubrinectidin cycle #3- APRIL 19th CT -shows progressive disease in the liver; New or at least significantly enlarged subpleural nodule of the peripheral right lower lobe, measuring 0.7 x 0.5 cm. This is also nonspecific but is worrisome for a small metastasis. Unchanged mixed lytic and sclerotic metastasis of the right scapular body. No new osseous lesions identified . STK-11/KEAPSAKE mutations-discussed regarding use of IPI-1+NIVO-3 every 3 weeks x 4 followed by annual by maintenance.   # Patient tolerating treatment well.  Proceed with cycle #2 of IPI+ NIVO today CBC CMP are reviewed. APRIL  TSH- WNL.  Discussed that we will plan to have a repeat scan after 3 cycles of treatment.  Will order next week.  # right foot drop sec to chemo-stable/improved- monitor for now. stable.  # Mild Hypokalemia-  stable. .   # mets to bone/ Pain right shoulder- currently s/p RT [ 09/16/2021]; STABLE;  continue percocet 10 mg every 4-6 hours also. S/p  RT on Oct 16th , 2023-. 10 fractions. OFF  fentanyl patch; On oxycontin; and oxycodone as per palliative care.  Refilled  by Sharia Reeve. Wants to Hold off IR ablation as pain is improved. stable. .   # weight loss/ loss of apetite/ nausea- on zyprexa qhs; add anti-emetic preemptively prn-   # constipation:  Miralax BID; fluids; Dulcolax- stable   # DM:  On Metformin-stable  # COPD: refilled albuterol; stable  #IV access: port functional- s/p TPA- functional  # DISPOSITION:  # IPI+NIVO today # follow up in 3 weeks- -  MD; port-labs- cbc/cmp;LDH-Thyroid profile-  IPI+NIVO- Dr.B     Earna Coder, MD 03/04/2023 9:53 AM

## 2023-03-16 ENCOUNTER — Other Ambulatory Visit: Payer: Self-pay | Admitting: *Deleted

## 2023-03-17 ENCOUNTER — Other Ambulatory Visit: Payer: Self-pay

## 2023-03-17 ENCOUNTER — Other Ambulatory Visit: Payer: Self-pay | Admitting: Internal Medicine

## 2023-03-17 ENCOUNTER — Encounter: Payer: Self-pay | Admitting: Internal Medicine

## 2023-03-17 MED FILL — Oxycodone w/ Acetaminophen Tab 10-325 MG: ORAL | 8 days supply | Qty: 90 | Fill #0 | Status: CN

## 2023-03-18 ENCOUNTER — Other Ambulatory Visit: Payer: Self-pay

## 2023-03-18 ENCOUNTER — Encounter: Payer: Self-pay | Admitting: Internal Medicine

## 2023-03-18 MED FILL — Oxycodone w/ Acetaminophen Tab 10-325 MG: ORAL | 8 days supply | Qty: 90 | Fill #0 | Status: AC

## 2023-03-23 ENCOUNTER — Other Ambulatory Visit: Payer: Self-pay | Admitting: Internal Medicine

## 2023-03-25 ENCOUNTER — Inpatient Hospital Stay: Payer: Medicare Other | Attending: Radiation Oncology

## 2023-03-25 ENCOUNTER — Inpatient Hospital Stay: Payer: Medicare Other

## 2023-03-25 ENCOUNTER — Other Ambulatory Visit: Payer: Self-pay

## 2023-03-25 ENCOUNTER — Inpatient Hospital Stay: Payer: Medicare Other | Attending: Radiation Oncology | Admitting: Internal Medicine

## 2023-03-25 ENCOUNTER — Encounter: Payer: Self-pay | Admitting: Internal Medicine

## 2023-03-25 DIAGNOSIS — C7B8 Other secondary neuroendocrine tumors: Secondary | ICD-10-CM | POA: Insufficient documentation

## 2023-03-25 DIAGNOSIS — F17210 Nicotine dependence, cigarettes, uncomplicated: Secondary | ICD-10-CM | POA: Insufficient documentation

## 2023-03-25 DIAGNOSIS — C7A8 Other malignant neuroendocrine tumors: Secondary | ICD-10-CM | POA: Diagnosis not present

## 2023-03-25 DIAGNOSIS — K5900 Constipation, unspecified: Secondary | ICD-10-CM | POA: Diagnosis not present

## 2023-03-25 DIAGNOSIS — M25511 Pain in right shoulder: Secondary | ICD-10-CM | POA: Diagnosis not present

## 2023-03-25 DIAGNOSIS — Z79620 Long term (current) use of immunosuppressive biologic: Secondary | ICD-10-CM | POA: Diagnosis not present

## 2023-03-25 DIAGNOSIS — Z902 Acquired absence of lung [part of]: Secondary | ICD-10-CM | POA: Insufficient documentation

## 2023-03-25 DIAGNOSIS — M21371 Foot drop, right foot: Secondary | ICD-10-CM | POA: Diagnosis not present

## 2023-03-25 DIAGNOSIS — J449 Chronic obstructive pulmonary disease, unspecified: Secondary | ICD-10-CM | POA: Insufficient documentation

## 2023-03-25 DIAGNOSIS — E876 Hypokalemia: Secondary | ICD-10-CM | POA: Insufficient documentation

## 2023-03-25 DIAGNOSIS — C3411 Malignant neoplasm of upper lobe, right bronchus or lung: Secondary | ICD-10-CM | POA: Diagnosis not present

## 2023-03-25 DIAGNOSIS — Z5112 Encounter for antineoplastic immunotherapy: Secondary | ICD-10-CM | POA: Insufficient documentation

## 2023-03-25 DIAGNOSIS — E119 Type 2 diabetes mellitus without complications: Secondary | ICD-10-CM | POA: Insufficient documentation

## 2023-03-25 DIAGNOSIS — G893 Neoplasm related pain (acute) (chronic): Secondary | ICD-10-CM | POA: Diagnosis not present

## 2023-03-25 LAB — CMP (CANCER CENTER ONLY)
ALT: 11 U/L (ref 0–44)
AST: 24 U/L (ref 15–41)
Albumin: 3.4 g/dL — ABNORMAL LOW (ref 3.5–5.0)
Alkaline Phosphatase: 175 U/L — ABNORMAL HIGH (ref 38–126)
Anion gap: 9 (ref 5–15)
BUN: 12 mg/dL (ref 8–23)
CO2: 24 mmol/L (ref 22–32)
Calcium: 9 mg/dL (ref 8.9–10.3)
Chloride: 103 mmol/L (ref 98–111)
Creatinine: 0.43 mg/dL — ABNORMAL LOW (ref 0.44–1.00)
GFR, Estimated: >60 mL/min (ref >60)
Glucose, Bld: 82 mg/dL (ref 70–99)
Potassium: 3.7 mmol/L (ref 3.5–5.1)
Sodium: 136 mmol/L (ref 135–145)
Total Bilirubin: 0.5 mg/dL (ref 0.3–1.2)
Total Protein: 7.3 g/dL (ref 6.5–8.1)

## 2023-03-25 LAB — CBC WITH DIFFERENTIAL (CANCER CENTER ONLY)
Abs Immature Granulocytes: 0.02 10*3/uL (ref 0.00–0.07)
Basophils Absolute: 0 10*3/uL (ref 0.0–0.1)
Basophils Relative: 1 %
Eosinophils Absolute: 0.1 10*3/uL (ref 0.0–0.5)
Eosinophils Relative: 1 %
HCT: 32.5 % — ABNORMAL LOW (ref 36.0–46.0)
Hemoglobin: 10.6 g/dL — ABNORMAL LOW (ref 12.0–15.0)
Immature Granulocytes: 0 %
Lymphocytes Relative: 38 %
Lymphs Abs: 2.3 10*3/uL (ref 0.7–4.0)
MCH: 29 pg (ref 26.0–34.0)
MCHC: 32.6 g/dL (ref 30.0–36.0)
MCV: 89 fL (ref 80.0–100.0)
Monocytes Absolute: 0.8 10*3/uL (ref 0.1–1.0)
Monocytes Relative: 13 %
Neutro Abs: 2.9 10*3/uL (ref 1.7–7.7)
Neutrophils Relative %: 47 %
Platelet Count: 320 10*3/uL (ref 150–400)
RBC: 3.65 MIL/uL — ABNORMAL LOW (ref 3.87–5.11)
RDW: 19.1 % — ABNORMAL HIGH (ref 11.5–15.5)
WBC Count: 6.1 10*3/uL (ref 4.0–10.5)
nRBC: 0 % (ref 0.0–0.2)

## 2023-03-25 LAB — TSH: TSH: 2.564 u[IU]/mL (ref 0.350–4.500)

## 2023-03-25 MED ORDER — SODIUM CHLORIDE 0.9 % IV SOLN
Freq: Once | INTRAVENOUS | Status: AC
  Filled 2023-03-25: qty 250

## 2023-03-25 MED ORDER — FAMOTIDINE IN NACL 20-0.9 MG/50ML-% IV SOLN
20.0000 mg | Freq: Once | INTRAVENOUS | Status: AC
  Administered 2023-03-25: 20 mg via INTRAVENOUS
  Filled 2023-03-25: qty 50

## 2023-03-25 MED ORDER — SODIUM CHLORIDE 0.9 % IV SOLN
2.9200 mg/kg | Freq: Once | INTRAVENOUS | Status: AC
  Administered 2023-03-25: 180 mg via INTRAVENOUS
  Filled 2023-03-25: qty 10

## 2023-03-25 MED ORDER — DIPHENHYDRAMINE HCL 50 MG/ML IJ SOLN
25.0000 mg | Freq: Once | INTRAMUSCULAR | Status: AC
  Administered 2023-03-25: 25 mg via INTRAVENOUS
  Filled 2023-03-25: qty 1

## 2023-03-25 MED ORDER — HEPARIN SOD (PORK) LOCK FLUSH 100 UNIT/ML IV SOLN
500.0000 [IU] | Freq: Once | INTRAVENOUS | Status: AC | PRN
  Administered 2023-03-25: 500 [IU]
  Filled 2023-03-25: qty 5

## 2023-03-25 MED ORDER — SODIUM CHLORIDE 0.9 % IV SOLN
1.0000 mg/kg | Freq: Once | INTRAVENOUS | Status: AC
  Administered 2023-03-25: 60 mg via INTRAVENOUS
  Filled 2023-03-25: qty 12

## 2023-03-25 MED ORDER — OXYCODONE HCL ER 40 MG PO T12A
40.0000 mg | EXTENDED_RELEASE_TABLET | Freq: Two times a day (BID) | ORAL | 0 refills | Status: DC
  Filled 2023-03-25: qty 60, 30d supply, fill #0

## 2023-03-25 NOTE — Progress Notes (Signed)
Pt states she may need increase in dose of the prilosec.

## 2023-03-25 NOTE — Assessment & Plan Note (Addendum)
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 #  s/p  Lubrinectidin cycle #3- APRIL 19th CT -shows progressive disease in the liver; New or at least significantly enlarged subpleural nodule of the peripheral right lower lobe, measuring 0.7 x 0.5 cm. This is also nonspecific but is worrisome for a small metastasis. Unchanged mixed lytic and sclerotic metastasis of the right scapular body. No new osseous lesions identified . STK-11/KEAPSAKE mutations- currently on IPI-1 every 6 weeks+NIVO- every 3 weeks-   # Patient tolerating treatment well except noted to worsening pain- right arm [see below]- Proceed with cycle #3 of IPI+ NIVO today CBC CMP are reviewed. APRIL  TSH- WNL.  Discussed that we will plan to have a repeat scan after 3 cycles of treatment.  Will order today- review in 2 weeks.  I am clinically concerned about progression of disease.  # right foot drop sec to chemo-stable/improved- monitor for now. stable.  # Mild Hypokalemia-  stable.   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 10 fractions. OFF  fentanyl patch; INCREASE the to oxycontin 40 BID; ; and oxycodone as per palliative care. Worsening pain- right arm [see below]- continue percocet 10 mg every 4-6 hours also. Defer to Josh re- eval on 6/19.   # weight loss/ loss of apetite/ nausea- on zyprexa qhs; add anti-emetic preemptively prn- stable  # constipation:  Miralax BID; fluids; Dulcolax-stable  # DM:  On Metformin-stable  # COPD: refilled albuterol; stable  #IV access: port functional- s/p TPA- functional  IPI q6W+ NIVOq3W # DISPOSITION:  # IPI + NIVO ONLY # follow up in 2  weeks - MD; port-labs- cbc/cmp;LDH-Thyroid profile; no treatment--  CT scan CAP-   Dr.B

## 2023-03-25 NOTE — Patient Instructions (Signed)
[place] [facility] REGION[facility]  Discharge Instructions: Thank you for choosing [facility] to provide your oncology and hematology care.  If you have a lab appointment with the Cancer Center, please go directly to the Cancer Center and check in at the registration area.  Wear comfortable clothing and clothing appropriate for easy access to any Portacath or PICC line.   We strive to give you quality time with your provider. You may need to reschedule your appointment if you arrive late (15 or more minutes).  Arriving late affects you and other patients whose appointments are after yours.  Also, if you miss three or more appointments without notifying the office, you may be dismissed from the clinic at the provider's discretion.      For prescription refill requests, have your pharmacy contact our office and allow 72 hours for refills to be completed.    Today you received the following chemotherapy and/or immunotherapy agents Nivolumab and Ipilmimab.      To help prevent nausea and vomiting after your treatment, we encourage you to take your nausea medication as directed.  BELOW ARE SYMPTOMS THAT SHOULD BE REPORTED IMMEDIATELY: *FEVER GREATER THAN 100.4 F (38 C) OR HIGHER *CHILLS OR SWEATING *NAUSEA AND VOMITING THAT IS NOT CONTROLLED WITH YOUR NAUSEA MEDICATION *UNUSUAL SHORTNESS OF BREATH *UNUSUAL BRUISING OR BLEEDING *URINARY PROBLEMS (pain or burning when urinating, or frequent urination) *BOWEL PROBLEMS (unusual diarrhea, constipation, pain near the anus) TENDERNESS IN MOUTH AND THROAT WITH OR WITHOUT PRESENCE OF ULCERS (sore throat, sores in mouth, or a toothache) UNUSUAL RASH, SWELLING OR PAIN  UNUSUAL VAGINAL DISCHARGE OR ITCHING   Items with * indicate a potential emergency and should be followed up as soon as possible or go to the Emergency Department if any problems should occur.  Please show the CHEMOTHERAPY ALERT CARD or IMMUNOTHERAPY ALERT CARD  at check-in to the Emergency Department and triage nurse.  Should you have questions after your visit or need to cancel or reschedule your appointment, please contact [place] [facility]  [phone] and follow the prompts.  Office hours are 8:00 a.m. to 4:30 p.m. Monday - Friday. Please note that voicemails left after 4:00 p.m. may not be returned until the following business day.  We are closed weekends and major holidays. You have access to a nurse at all times for urgent questions. Please call the main number to the clinic [phone] and follow the prompts.  For any non-urgent questions, you may also contact your provider using MyChart. We now offer e-Visits for anyone 56 and older to request care online for non-urgent symptoms. For details visit [email or web address].   Also download the MyChart app! Go to the app store, search "MyChart", open the app, select [facility], and log in with your MyChart username and password.

## 2023-03-25 NOTE — Progress Notes (Signed)
Nutrition Follow-up:  Patient with recurrent lung cancer.  Receiving immunotherapy.    Met with patient during infusion. Reports that appetite is a little better.  Last night was able to eat a [place] sub.  Also had chicken wing and green beans and corn yesterday.  Drinking glucerna shakes at times. Reports nausea but medications help    Medications: reviewed  Labs: reviewed  Anthropometrics:   Weight 133 lb 12.8 oz today 134 lb 12.8 oz on 5/24 137 lb 3.2 oz on 3/8 140 lb 9.6 oz on 2/16   NUTRITION DIAGNOSIS: Inadequate oral intake stable    INTERVENTION:  Encouraged patient to continue high calorie/high protein foods q 2 hours Continue glucerna shakes    MONITORING, EVALUATION, GOAL: weight trends, intake   NEXT VISIT: as needed  Osmani Kersten B. Freida Busman, RD, LDN Registered Dietitian [phone]
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
## 2023-03-26 LAB — T4: T4, Total: 9 ug/dL (ref 4.5–12.0)

## 2023-03-28 ENCOUNTER — Other Ambulatory Visit: Payer: Self-pay

## 2023-03-28 ENCOUNTER — Other Ambulatory Visit: Payer: Self-pay | Admitting: *Deleted

## 2023-03-28 MED ORDER — OXYCODONE-ACETAMINOPHEN 10-325 MG PO TABS
1.0000 | ORAL_TABLET | ORAL | 0 refills | Status: DC | PRN
  Filled 2023-03-28: qty 90, 15d supply, fill #0

## 2023-03-28 NOTE — Telephone Encounter (Signed)
Pt states needs refill of percocet and will run out before her scheduled telephone visit with Josh on 6/19. Pt states that she does need to discuss her dosage of oxycontin with Josh because she is having to take more pain medication to help control her pain.

## 2023-03-30 ENCOUNTER — Other Ambulatory Visit: Payer: Self-pay

## 2023-03-30 ENCOUNTER — Encounter: Payer: Self-pay | Admitting: Internal Medicine

## 2023-03-30 ENCOUNTER — Inpatient Hospital Stay: Payer: Medicare Other | Admitting: Hospice and Palliative Medicine

## 2023-03-30 DIAGNOSIS — C3411 Malignant neoplasm of upper lobe, right bronchus or lung: Secondary | ICD-10-CM

## 2023-03-30 DIAGNOSIS — Z515 Encounter for palliative care: Secondary | ICD-10-CM

## 2023-03-30 MED ORDER — OLANZAPINE 5 MG PO TABS
5.0000 mg | ORAL_TABLET | Freq: Every day | ORAL | 3 refills | Status: DC
  Filled 2023-03-30: qty 30, 30d supply, fill #0

## 2023-03-30 NOTE — Progress Notes (Signed)
Virtual Visit via Telephone Note  I connected with Stacie Kennedy on 03/30/23 at  1:00 PM EDT by telephone and verified that I am speaking with the correct person using two identifiers.  Location: Patient: Home Provider: Clinic   I discussed the limitations, risks, security and privacy concerns of performing an evaluation and management service by telephone and the availability of in person appointments. I also discussed with the patient that there may be a patient responsible charge related to this service. The patient expressed understanding and agreed to proceed.   History of Present Illness: Stacie Kennedy is a 62 y.o. 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  Patient has had severe pain with scapular metastases and was started on XRT.  She is referred to palliative care to help address goals and manage ongoing symptoms.    Observations/Objective: I spoke with patient by phone.  She says that she is doing about the same.  Patient says that she is having some nausea today without vomiting.  She takes Zofran on average once or twice a day but is not currently taking olanzapine or prochlorperazine.  I suggested that she restart the olanzapine at bedtime to see if that better controls her nausea.  Patient says that her pain is stable on OxyContin with as needed Percocet.  On average, she is taking the Percocet 3 times daily.  She denies any adverse effects.  Assessment and Plan: Stage IV lung cancer -on immunotherapy  Neoplasm related pain -continue OxyContin/Percocet  Nausea -refill olanzapine.  Continue as needed ondansetron.  Follow Up Instructions: Follow-up telephone visit 1 to 2 months   I discussed the assessment and treatment plan with the patient. The patient was provided an  opportunity to ask questions and all were answered. The patient agreed with the plan and demonstrated an understanding of the instructions.   The patient was advised to call back or seek an in-person evaluation if the symptoms worsen or if the condition fails to improve as anticipated.  I provided 10 minutes of non-face-to-face time during this encounter.   Malachy Moan, NP

## 2023-04-01 ENCOUNTER — Other Ambulatory Visit: Payer: Self-pay | Admitting: *Deleted

## 2023-04-01 DIAGNOSIS — C3411 Malignant neoplasm of upper lobe, right bronchus or lung: Secondary | ICD-10-CM

## 2023-04-07 ENCOUNTER — Ambulatory Visit
Admission: RE | Admit: 2023-04-07 | Discharge: 2023-04-07 | Disposition: A | Discharge status: Home / Self Care | Payer: Medicare Other | Source: Ambulatory Visit | Attending: Internal Medicine | Admitting: Internal Medicine

## 2023-04-07 DIAGNOSIS — C3411 Malignant neoplasm of upper lobe, right bronchus or lung: Secondary | ICD-10-CM | POA: Insufficient documentation

## 2023-04-07 MED ORDER — IOHEXOL 300 MG/ML  SOLN
100.0000 mL | Freq: Once | INTRAMUSCULAR | Status: AC | PRN
  Administered 2023-04-07: 100 mL via INTRAVENOUS

## 2023-04-08 ENCOUNTER — Inpatient Hospital Stay (HOSPITAL_BASED_OUTPATIENT_CLINIC_OR_DEPARTMENT_OTHER): Payer: Medicare Other | Admitting: Internal Medicine

## 2023-04-08 ENCOUNTER — Encounter: Payer: Self-pay | Admitting: Internal Medicine

## 2023-04-08 ENCOUNTER — Other Ambulatory Visit: Payer: Self-pay

## 2023-04-08 ENCOUNTER — Telehealth: Payer: Self-pay | Admitting: Internal Medicine

## 2023-04-08 ENCOUNTER — Inpatient Hospital Stay: Payer: Medicare Other

## 2023-04-08 ENCOUNTER — Other Ambulatory Visit: Payer: Self-pay | Admitting: *Deleted

## 2023-04-08 VITALS — BP 114/65 | HR 103 | Temp 97.8°F | Ht 67.0 in | Wt 130.4 lb

## 2023-04-08 DIAGNOSIS — Z95828 Presence of other vascular implants and grafts: Secondary | ICD-10-CM

## 2023-04-08 DIAGNOSIS — Z5112 Encounter for antineoplastic immunotherapy: Secondary | ICD-10-CM | POA: Diagnosis not present

## 2023-04-08 DIAGNOSIS — C3411 Malignant neoplasm of upper lobe, right bronchus or lung: Secondary | ICD-10-CM

## 2023-04-08 LAB — CMP (CANCER CENTER ONLY)
ALT: 14 U/L (ref 0–44)
AST: 28 U/L (ref 15–41)
Albumin: 3.8 g/dL (ref 3.5–5.0)
Alkaline Phosphatase: 199 U/L — ABNORMAL HIGH (ref 38–126)
Anion gap: 15 (ref 5–15)
BUN: 18 mg/dL (ref 8–23)
CO2: 22 mmol/L (ref 22–32)
Calcium: 10.3 mg/dL (ref 8.9–10.3)
Chloride: 101 mmol/L (ref 98–111)
Creatinine: 0.69 mg/dL (ref 0.44–1.00)
GFR, Estimated: >60 mL/min (ref >60)
Glucose, Bld: 105 mg/dL — ABNORMAL HIGH (ref 70–99)
Potassium: 3.5 mmol/L (ref 3.5–5.1)
Sodium: 138 mmol/L (ref 135–145)
Total Bilirubin: 0.7 mg/dL (ref 0.3–1.2)
Total Protein: 7.8 g/dL (ref 6.5–8.1)

## 2023-04-08 LAB — CBC WITH DIFFERENTIAL (CANCER CENTER ONLY)
Abs Immature Granulocytes: 0.03 10*3/uL (ref 0.00–0.07)
Basophils Absolute: 0 10*3/uL (ref 0.0–0.1)
Basophils Relative: 1 %
Eosinophils Absolute: 0.1 10*3/uL (ref 0.0–0.5)
Eosinophils Relative: 1 %
HCT: 35.3 % — ABNORMAL LOW (ref 36.0–46.0)
Hemoglobin: 11.5 g/dL — ABNORMAL LOW (ref 12.0–15.0)
Immature Granulocytes: 1 %
Lymphocytes Relative: 25 %
Lymphs Abs: 1.4 10*3/uL (ref 0.7–4.0)
MCH: 28.8 pg (ref 26.0–34.0)
MCHC: 32.6 g/dL (ref 30.0–36.0)
MCV: 88.3 fL (ref 80.0–100.0)
Monocytes Absolute: 0.7 10*3/uL (ref 0.1–1.0)
Monocytes Relative: 12 %
Neutro Abs: 3.3 10*3/uL (ref 1.7–7.7)
Neutrophils Relative %: 60 %
Platelet Count: 303 10*3/uL (ref 150–400)
RBC: 4 MIL/uL (ref 3.87–5.11)
RDW: 18.6 % — ABNORMAL HIGH (ref 11.5–15.5)
WBC Count: 5.5 10*3/uL (ref 4.0–10.5)
nRBC: 0 % (ref 0.0–0.2)

## 2023-04-08 LAB — LACTATE DEHYDROGENASE: LDH: 514 U/L — ABNORMAL HIGH (ref 98–192)

## 2023-04-08 MED ORDER — HEPARIN SOD (PORK) LOCK FLUSH 100 UNIT/ML IV SOLN
500.0000 [IU] | Freq: Once | INTRAVENOUS | Status: DC
  Filled 2023-04-08: qty 5

## 2023-04-08 MED ORDER — OXYCODONE-ACETAMINOPHEN 10-325 MG PO TABS
1.0000 | ORAL_TABLET | ORAL | 0 refills | Status: DC | PRN
  Filled 2023-04-08: qty 55, 7d supply, fill #0
  Filled 2023-04-08: qty 90, 18d supply, fill #0

## 2023-04-08 MED ORDER — SODIUM CHLORIDE 0.9% FLUSH
10.0000 mL | Freq: Once | INTRAVENOUS | Status: DC
  Filled 2023-04-08: qty 10

## 2023-04-08 NOTE — Telephone Encounter (Signed)
Pt already requesting refill Percocet

## 2023-04-08 NOTE — Assessment & Plan Note (Addendum)
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 APRIL 19th CT -shows progressive disease in the liver; New or at least significantly enlarged subpleural nodule of the peripheral right lower lobe, measuring 0.7 x 0.5 cm. This is also nonspecific but is worrisome for a small metastasis. Unchanged mixed lytic and sclerotic metastasis of the right scapular body. No new osseous lesions identified . 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 Unchanged mixed lytic and sclerotic osseous metastatic lesion of the right scapular body.  DISCONTINUE IPI+ NIVO given the progressive disease.  # Had a long discussion regarding overall poor prognosis of patients malignancy in the context of progressive disease status post multiple lines of therapy.  Discussed the pros and cons of subsequent line of palliative chemotherapy versus hospice.  Given the low response rates and subsequent chemotherapy note of 10%-I did recommend hospice.  However patient is adamant that she would not want to go to hospice.   # Given patient's reasonable performance status of PS1-I think is reasonable to consider palliative chemotherapy-with gemcitabine.  Orders in.  Patient wants to follow-up with after July 4th for treatment.  # right foot drop sec to chemo-stable/improved- monitor for now. stable.  # Mild Hypokalemia-  stable.  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 10 fractions. OFF  fentanyl patch;  INCREASE the to oxycontin 40 BID; ; and oxycodone as per palliative care. Clonically stable.    # weight loss/ loss of apetite/ nausea- on zyprexa qhs; add anti-emetic preemptively prn- stable  # constipation:  Miralax BID; fluids; Dulcolax-stable  # DM:  On Metformin-stable  # COPD: refilled albuterol; stable  #IV access: port functional- s/p TPA- functional  # DISPOSITION:  As per-  # follow up in 7/08  APP- ; port-labs- cbc/cmp;LDH-- Gemciatibine- # follow up in 7/ 15 - APP- ; port-labs- cbc/cmp;LDH-- Gemciatibine-   # follow up in 7/29 -  MD ; port-labs- cbc/cmp;LDH-- Gemciatibine-   # follow up in 8/05 MD - ; port-labs- cbc/cmp;LDH-- Gemciatibine- Dr.B  # I reviewed the blood work- with the patient in detail; also reviewed the imaging independently [as summarized above]; and with the patient in detail.   # 40 minutes face-to-face with the patient discussing the above plan of care; more than 50% of time spent on prognosis/ natural history; counseling and coordination.

## 2023-04-08 NOTE — Progress Notes (Signed)
C/o sob, cough.  Called twice for CT results, still not read.

## 2023-04-08 NOTE — Progress Notes (Signed)
Patient declined port to be accessed. She requested peripheral labs.

## 2023-04-08 NOTE — Progress Notes (Signed)
DISCONTINUE OFF PATHWAY REGIMEN - Other   [id number]:Ipilimumab 1 mg/kg IV D1 + Nivolumab 3 mg/kg IV D1 q21 Days x 4 Cycles Followed by Nivolumab 240 mg IV D1 q14 Days:   Cycles 1 through 4: A cycle is every 21 days:     Nivolumab      Ipilimumab    Cycles 5 and beyond: A cycle is every 14 days:     Nivolumab   **Always confirm dose/schedule in your pharmacy ordering system**  REASON: Disease Progression PRIOR TREATMENT: Ipilimumab 1 mg/kg IV D1 + Nivolumab 3 mg/kg IV D1 q21 Days x 4 Cycles Followed by Nivolumab 240 mg IV D1 q14 Days TREATMENT RESPONSE: Progressive Disease (PD)  START OFF PATHWAY REGIMEN - Other   [id number]:Gemcitabine 1,000 mg/m2 IV D1,8 q21 Days:   A cycle is every 21 days:     Gemcitabine   **Always confirm dose/schedule in your pharmacy ordering system**  Patient Characteristics: Intent of Therapy: Non-Curative / Palliative Intent, Discussed with Patient

## 2023-04-08 NOTE — Telephone Encounter (Signed)
Received Vm from patient requesting percocet refill be sent to [facility].
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
## 2023-04-11 ENCOUNTER — Encounter: Payer: Self-pay | Admitting: Internal Medicine

## 2023-04-11 LAB — THYROID PANEL WITH TSH
Free Thyroxine Index: 2.7 (ref 1.2–4.9)
T3 Uptake Ratio: 27 % (ref 24–39)
T4, Total: 10 ug/dL (ref 4.5–12.0)
TSH: 1.89 u[IU]/mL (ref 0.450–4.500)

## 2023-04-18 ENCOUNTER — Inpatient Hospital Stay: Payer: Medicare Other | Attending: Radiation Oncology

## 2023-04-18 ENCOUNTER — Encounter: Payer: Self-pay | Admitting: Nurse Practitioner

## 2023-04-18 ENCOUNTER — Inpatient Hospital Stay: Payer: Medicare Other

## 2023-04-18 ENCOUNTER — Inpatient Hospital Stay (HOSPITAL_BASED_OUTPATIENT_CLINIC_OR_DEPARTMENT_OTHER): Payer: Medicare Other | Admitting: Nurse Practitioner

## 2023-04-18 VITALS — BP 111/59 | HR 63 | Resp 18

## 2023-04-18 VITALS — BP 118/61 | HR 79 | Temp 98.2°F | Wt 133.0 lb

## 2023-04-18 DIAGNOSIS — Z7189 Other specified counseling: Secondary | ICD-10-CM | POA: Diagnosis not present

## 2023-04-18 DIAGNOSIS — R630 Anorexia: Secondary | ICD-10-CM | POA: Insufficient documentation

## 2023-04-18 DIAGNOSIS — E119 Type 2 diabetes mellitus without complications: Secondary | ICD-10-CM | POA: Diagnosis not present

## 2023-04-18 DIAGNOSIS — I700 Atherosclerosis of aorta: Secondary | ICD-10-CM | POA: Insufficient documentation

## 2023-04-18 DIAGNOSIS — F17210 Nicotine dependence, cigarettes, uncomplicated: Secondary | ICD-10-CM | POA: Diagnosis not present

## 2023-04-18 DIAGNOSIS — I2510 Atherosclerotic heart disease of native coronary artery without angina pectoris: Secondary | ICD-10-CM | POA: Diagnosis not present

## 2023-04-18 DIAGNOSIS — Z79899 Other long term (current) drug therapy: Secondary | ICD-10-CM | POA: Diagnosis not present

## 2023-04-18 DIAGNOSIS — M25511 Pain in right shoulder: Secondary | ICD-10-CM | POA: Insufficient documentation

## 2023-04-18 DIAGNOSIS — C787 Secondary malignant neoplasm of liver and intrahepatic bile duct: Secondary | ICD-10-CM | POA: Diagnosis not present

## 2023-04-18 DIAGNOSIS — R110 Nausea: Secondary | ICD-10-CM

## 2023-04-18 DIAGNOSIS — K5909 Other constipation: Secondary | ICD-10-CM | POA: Diagnosis not present

## 2023-04-18 DIAGNOSIS — M21371 Foot drop, right foot: Secondary | ICD-10-CM | POA: Diagnosis not present

## 2023-04-18 DIAGNOSIS — C3411 Malignant neoplasm of upper lobe, right bronchus or lung: Secondary | ICD-10-CM | POA: Insufficient documentation

## 2023-04-18 DIAGNOSIS — Z7952 Long term (current) use of systemic steroids: Secondary | ICD-10-CM | POA: Diagnosis not present

## 2023-04-18 DIAGNOSIS — E7800 Pure hypercholesterolemia, unspecified: Secondary | ICD-10-CM | POA: Diagnosis not present

## 2023-04-18 DIAGNOSIS — J439 Emphysema, unspecified: Secondary | ICD-10-CM | POA: Diagnosis not present

## 2023-04-18 DIAGNOSIS — Z5111 Encounter for antineoplastic chemotherapy: Secondary | ICD-10-CM

## 2023-04-18 DIAGNOSIS — E876 Hypokalemia: Secondary | ICD-10-CM | POA: Diagnosis not present

## 2023-04-18 DIAGNOSIS — R42 Dizziness and giddiness: Secondary | ICD-10-CM | POA: Insufficient documentation

## 2023-04-18 DIAGNOSIS — Z5112 Encounter for antineoplastic immunotherapy: Secondary | ICD-10-CM | POA: Diagnosis not present

## 2023-04-18 DIAGNOSIS — Z7984 Long term (current) use of oral hypoglycemic drugs: Secondary | ICD-10-CM | POA: Insufficient documentation

## 2023-04-18 DIAGNOSIS — T451X5A Adverse effect of antineoplastic and immunosuppressive drugs, initial encounter: Secondary | ICD-10-CM

## 2023-04-18 DIAGNOSIS — C7951 Secondary malignant neoplasm of bone: Secondary | ICD-10-CM | POA: Diagnosis not present

## 2023-04-18 LAB — CBC WITH DIFFERENTIAL (CANCER CENTER ONLY)
Abs Immature Granulocytes: 0.03 10*3/uL (ref 0.00–0.07)
Basophils Absolute: 0 10*3/uL (ref 0.0–0.1)
Basophils Relative: 1 %
Eosinophils Absolute: 0.1 10*3/uL (ref 0.0–0.5)
Eosinophils Relative: 1 %
HCT: 32.8 % — ABNORMAL LOW (ref 36.0–46.0)
Hemoglobin: 10.5 g/dL — ABNORMAL LOW (ref 12.0–15.0)
Immature Granulocytes: 1 %
Lymphocytes Relative: 27 %
Lymphs Abs: 1.5 10*3/uL (ref 0.7–4.0)
MCH: 28.8 pg (ref 26.0–34.0)
MCHC: 32 g/dL (ref 30.0–36.0)
MCV: 89.9 fL (ref 80.0–100.0)
Monocytes Absolute: 0.8 10*3/uL (ref 0.1–1.0)
Monocytes Relative: 14 %
Neutro Abs: 3.2 10*3/uL (ref 1.7–7.7)
Neutrophils Relative %: 56 %
Platelet Count: 321 10*3/uL (ref 150–400)
RBC: 3.65 MIL/uL — ABNORMAL LOW (ref 3.87–5.11)
RDW: 18.7 % — ABNORMAL HIGH (ref 11.5–15.5)
WBC Count: 5.6 10*3/uL (ref 4.0–10.5)
nRBC: 0 % (ref 0.0–0.2)

## 2023-04-18 LAB — CMP (CANCER CENTER ONLY)
ALT: 11 U/L (ref 0–44)
AST: 27 U/L (ref 15–41)
Albumin: 3.6 g/dL (ref 3.5–5.0)
Alkaline Phosphatase: 190 U/L — ABNORMAL HIGH (ref 38–126)
Anion gap: 9 (ref 5–15)
BUN: 14 mg/dL (ref 8–23)
CO2: 25 mmol/L (ref 22–32)
Calcium: 9.3 mg/dL (ref 8.9–10.3)
Chloride: 102 mmol/L (ref 98–111)
Creatinine: 0.45 mg/dL (ref 0.44–1.00)
GFR, Estimated: >60 mL/min (ref >60)
Glucose, Bld: 106 mg/dL — ABNORMAL HIGH (ref 70–99)
Potassium: 3.7 mmol/L (ref 3.5–5.1)
Sodium: 136 mmol/L (ref 135–145)
Total Bilirubin: 0.3 mg/dL (ref 0.3–1.2)
Total Protein: 7.3 g/dL (ref 6.5–8.1)

## 2023-04-18 LAB — LACTATE DEHYDROGENASE: LDH: 475 U/L — ABNORMAL HIGH (ref 98–192)

## 2023-04-18 MED ORDER — HEPARIN SOD (PORK) LOCK FLUSH 100 UNIT/ML IV SOLN
500.0000 [IU] | Freq: Once | INTRAVENOUS | Status: AC | PRN
  Administered 2023-04-18: 500 [IU]
  Filled 2023-04-18: qty 5

## 2023-04-18 MED ORDER — SODIUM CHLORIDE 0.9 % IV SOLN
Freq: Once | INTRAVENOUS | Status: AC
  Filled 2023-04-18: qty 250

## 2023-04-18 MED ORDER — SODIUM CHLORIDE 0.9 % IV SOLN
1000.0000 mg/m2 | Freq: Once | INTRAVENOUS | Status: AC
  Administered 2023-04-18: 1672 mg via INTRAVENOUS
  Filled 2023-04-18: qty 43.97

## 2023-04-18 MED ORDER — DULOXETINE HCL 30 MG PO CPEP: 30.0000 mg | ORAL_CAPSULE | Freq: Every day | ORAL | 3 refills | Status: DC

## 2023-04-18 MED ORDER — SODIUM CHLORIDE 0.9% FLUSH
10.0000 mL | Freq: Once | INTRAVENOUS | Status: AC
  Administered 2023-04-18: 10 mL via INTRAVENOUS
  Filled 2023-04-18: qty 10

## 2023-04-18 MED ORDER — PROCHLORPERAZINE MALEATE 10 MG PO TABS
10.0000 mg | ORAL_TABLET | Freq: Once | ORAL | Status: AC
  Administered 2023-04-18: 10 mg via ORAL
  Filled 2023-04-18: qty 1
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
## 2023-04-18 NOTE — Patient Instructions (Signed)
[facility]  Discharge Instructions: Thank you for choosing [facility] to provide your oncology and hematology care.  If you have a lab appointment with the Cancer Center, please go directly to the Cancer Center and check in at the registration area.  Wear comfortable clothing and clothing appropriate for easy access to any Portacath or PICC line.   We strive to give you quality time with your provider. You may need to reschedule your appointment if you arrive late (15 or more minutes).  Arriving late affects you and other patients whose appointments are after yours.  Also, if you miss three or more appointments without notifying the office, you may be dismissed from the clinic at the provider's discretion.      For prescription refill requests, have your pharmacy contact our office and allow 72 hours for refills to be completed.    Today you received the following chemotherapy and/or immunotherapy agents gemzar    To help prevent nausea and vomiting after your treatment, we encourage you to take your nausea medication as directed.  BELOW ARE SYMPTOMS THAT SHOULD BE REPORTED IMMEDIATELY: *FEVER GREATER THAN 100.4 F (38 C) OR HIGHER *CHILLS OR SWEATING *NAUSEA AND VOMITING THAT IS NOT CONTROLLED WITH YOUR NAUSEA MEDICATION *UNUSUAL SHORTNESS OF BREATH *UNUSUAL BRUISING OR BLEEDING *URINARY PROBLEMS (pain or burning when urinating, or frequent urination) *BOWEL PROBLEMS (unusual diarrhea, constipation, pain near the anus) TENDERNESS IN MOUTH AND THROAT WITH OR WITHOUT PRESENCE OF ULCERS (sore throat, sores in mouth, or a toothache) UNUSUAL RASH, SWELLING OR PAIN  UNUSUAL VAGINAL DISCHARGE OR ITCHING   Items with * indicate a potential emergency and should be followed up as soon as possible or go to the Emergency Department if any problems should occur.  Please show the CHEMOTHERAPY ALERT CARD or IMMUNOTHERAPY ALERT CARD at check-in to the  Emergency Department and triage nurse.  Should you have questions after your visit or need to cancel or reschedule your appointment, please contact [facility]  [phone] and follow the prompts.  Office hours are 8:00 a.m. to 4:30 p.m. Monday - Friday. Please note that voicemails left after 4:00 p.m. may not be returned until the following business day.  We are closed weekends and major holidays. You have access to a nurse at all times for urgent questions. Please call the main number to the clinic [phone] and follow the prompts.  For any non-urgent questions, you may also contact your provider using MyChart. We now offer e-Visits for anyone 84 and older to request care online for non-urgent symptoms. For details visit [email or web address].   Also download the MyChart app! Go to the app store, search "MyChart", open the app, select Gem, and log in with your MyChart username and password.   Gemcitabine Injection What is this medication? GEMCITABINE (jem SYE ta been) treats some types of cancer. It works by slowing down the growth of cancer cells. This medicine may be used for other purposes; ask your health care provider or pharmacist if you have questions. COMMON BRAND NAME(S): Gemzar, Infugem What should I tell my care team before I take this medication? They need to know if you have any of these conditions: Blood disorders Infection Kidney disease Liver disease Lung or breathing disease, such as asthma or COPD Recent or ongoing radiation therapy An unusual or allergic reaction to gemcitabine, other medications, foods, dyes, or preservatives If you or your partner are pregnant or  trying to get pregnant Breast-feeding How should I use this medication? This medication is injected into a vein. It is given by your care team in a hospital or clinic setting. Talk to your care team about the use of this medication in children. Special care may  be needed. Overdosage: If you think you have taken too much of this medicine contact a poison control center or emergency room at once. NOTE: This medicine is only for you. Do not share this medicine with others. What if I miss a dose? Keep appointments for follow-up doses. It is important not to miss your dose. Call your care team if you are unable to keep an appointment. What may interact with this medication? Interactions have not been studied. This list may not describe all possible interactions. Give your health care provider a list of all the medicines, herbs, non-prescription drugs, or dietary supplements you use. Also tell them if you smoke, drink alcohol, or use illegal drugs. Some items may interact with your medicine. What should I watch for while using this medication? Your condition will be monitored carefully while you are receiving this medication. This medication may make you feel generally unwell. This is not uncommon, as chemotherapy can affect healthy cells as well as cancer cells. Report any side effects. Continue your course of treatment even though you feel ill unless your care team tells you to stop. In some cases, you may be given additional medications to help with side effects. Follow all directions for their use. This medication may increase your risk of getting an infection. Call your care team for advice if you get a fever, chills, sore throat, or other symptoms of a cold or flu. Do not treat yourself. Try to avoid being around people who are sick. This medication may increase your risk to bruise or bleed. Call your care team if you notice any unusual bleeding. Be careful brushing or flossing your teeth or using a toothpick because you may get an infection or bleed more easily. If you have any dental work done, tell your dentist you are receiving this medication. Avoid taking medications that contain aspirin, acetaminophen, ibuprofen, naproxen, or ketoprofen unless  instructed by your care team. These medications may hide a fever. Talk to your care team if you or your partner wish to become pregnant or think you might be pregnant. This medication can cause serious birth defects if taken during pregnancy and for 6 months after the last dose. A negative pregnancy test is required before starting this medication. A reliable form of contraception is recommended while taking this medication and for 6 months after the last dose. Talk to your care team about effective forms of contraception. Do not father a child while taking this medication and for 3 months after the last dose. Use a condom while having sex during this time period. Do not breastfeed while taking this medication and for at least 1 week after the last dose. This medication may cause infertility. Talk to your care team if you are concerned about your fertility. What side effects may I notice from receiving this medication? Side effects that you should report to your care team as soon as possible: Allergic reactions--skin rash, itching, hives, swelling of the face, lips, tongue, or throat Capillary leak syndrome--stomach or muscle pain, unusual weakness or fatigue, feeling faint or lightheaded, decrease in the amount of urine, swelling of the ankles, hands, or feet, trouble breathing Infection--fever, chills, cough, sore throat, wounds that don't heal, pain  or trouble when passing urine, general feeling of discomfort or being unwell Liver injury--right upper belly pain, loss of appetite, nausea, light-colored stool, dark yellow or brown urine, yellowing skin or eyes, unusual weakness or fatigue Low red blood cell level--unusual weakness or fatigue, dizziness, headache, trouble breathing Lung injury--shortness of breath or trouble breathing, cough, spitting up blood, chest pain, fever Stomach pain, bloody diarrhea, pale skin, unusual weakness or fatigue, decrease in the amount of urine, which may be signs of  hemolytic uremic syndrome Sudden and severe headache, confusion, change in vision, seizures, which may be signs of posterior reversible encephalopathy syndrome (PRES) Unusual bruising or bleeding Side effects that usually do not require medical attention (report to your care team if they continue or are bothersome): Diarrhea Drowsiness Hair loss Nausea Pain, redness, or swelling with sores inside the mouth or throat Vomiting This list may not describe all possible side effects. Call your doctor for medical advice about side effects. You may report side effects to FDA at 1-800-FDA-1088. Where should I keep my medication? This medication is given in a hospital or clinic. It will not be stored at home. NOTE: This sheet is a summary. It may not cover all possible information. If you have questions about this medicine, talk to your doctor, pharmacist, or health care provider.  2024 [employer] Standard (2022-02-02 00:00:00)

## 2023-04-19 ENCOUNTER — Other Ambulatory Visit: Payer: Self-pay

## 2023-04-19 ENCOUNTER — Other Ambulatory Visit (HOSPITAL_COMMUNITY): Payer: Self-pay

## 2023-04-19 ENCOUNTER — Other Ambulatory Visit: Payer: Self-pay | Admitting: *Deleted

## 2023-04-19 MED ORDER — OXYCODONE-ACETAMINOPHEN 10-325 MG PO TABS
1.0000 | ORAL_TABLET | ORAL | 0 refills | Status: DC | PRN
  Filled 2023-04-19: qty 90, 8d supply, fill #0
  Filled 2023-04-21: qty 84, 7d supply, fill #0

## 2023-04-19 MED ORDER — OXYCODONE HCL ER 40 MG PO T12A
40.0000 mg | EXTENDED_RELEASE_TABLET | Freq: Two times a day (BID) | ORAL | 0 refills | Status: DC
  Filled 2023-04-19: qty 60, 30d supply, fill #0
  Filled 2023-04-21: qty 50, 25d supply, fill #0
  Filled 2023-04-21: qty 10, 5d supply, fill #0

## 2023-04-20 ENCOUNTER — Other Ambulatory Visit: Payer: Self-pay

## 2023-04-21 ENCOUNTER — Other Ambulatory Visit: Payer: Self-pay

## 2023-04-21 ENCOUNTER — Encounter: Payer: Self-pay | Admitting: Internal Medicine

## 2023-04-21 ENCOUNTER — Telehealth: Payer: Self-pay | Admitting: *Deleted

## 2023-04-21 NOTE — Telephone Encounter (Signed)
Received message from pt that was unable to pick up prescriptions at pharmacy. Spoke with pharmacy who stated that pt's insurance coverage changed from medicaid to medicare and they were able to run both prescriptions through medicare without issues. Medicare will cover initial 7-day supply (#84) of percocet then subsequent refills can be sent into for #90. Pt informed that can pick up both prescriptions today for percocet and oxycontin.

## 2023-04-22 ENCOUNTER — Other Ambulatory Visit: Payer: Self-pay

## 2023-04-25 ENCOUNTER — Inpatient Hospital Stay: Payer: Medicare Other

## 2023-04-25 ENCOUNTER — Encounter: Payer: Self-pay | Admitting: Internal Medicine

## 2023-04-25 ENCOUNTER — Inpatient Hospital Stay (HOSPITAL_BASED_OUTPATIENT_CLINIC_OR_DEPARTMENT_OTHER): Payer: Medicare Other | Admitting: Internal Medicine

## 2023-04-25 VITALS — BP 113/74 | HR 96 | Temp 97.7°F | Ht 67.0 in | Wt 129.8 lb

## 2023-04-25 DIAGNOSIS — Z5112 Encounter for antineoplastic immunotherapy: Secondary | ICD-10-CM | POA: Diagnosis not present

## 2023-04-25 DIAGNOSIS — C3411 Malignant neoplasm of upper lobe, right bronchus or lung: Secondary | ICD-10-CM | POA: Diagnosis not present

## 2023-04-25 DIAGNOSIS — R42 Dizziness and giddiness: Secondary | ICD-10-CM

## 2023-04-25 LAB — CBC WITH DIFFERENTIAL (CANCER CENTER ONLY)
Abs Immature Granulocytes: 0.03 10*3/uL (ref 0.00–0.07)
Basophils Absolute: 0 10*3/uL (ref 0.0–0.1)
Basophils Relative: 0 %
Eosinophils Absolute: 0 10*3/uL (ref 0.0–0.5)
Eosinophils Relative: 0 %
HCT: 32.1 % — ABNORMAL LOW (ref 36.0–46.0)
Hemoglobin: 10.4 g/dL — ABNORMAL LOW (ref 12.0–15.0)
Immature Granulocytes: 1 %
Lymphocytes Relative: 29 %
Lymphs Abs: 1.6 10*3/uL (ref 0.7–4.0)
MCH: 28.7 pg (ref 26.0–34.0)
MCHC: 32.4 g/dL (ref 30.0–36.0)
MCV: 88.7 fL (ref 80.0–100.0)
Monocytes Absolute: 0.9 10*3/uL (ref 0.1–1.0)
Monocytes Relative: 17 %
Neutro Abs: 2.9 10*3/uL (ref 1.7–7.7)
Neutrophils Relative %: 53 %
Platelet Count: 173 10*3/uL (ref 150–400)
RBC: 3.62 MIL/uL — ABNORMAL LOW (ref 3.87–5.11)
RDW: 18.1 % — ABNORMAL HIGH (ref 11.5–15.5)
WBC Count: 5.4 10*3/uL (ref 4.0–10.5)
nRBC: 0 % (ref 0.0–0.2)

## 2023-04-25 LAB — CMP (CANCER CENTER ONLY)
ALT: 11 U/L (ref 0–44)
AST: 24 U/L (ref 15–41)
Albumin: 3.8 g/dL (ref 3.5–5.0)
Alkaline Phosphatase: 191 U/L — ABNORMAL HIGH (ref 38–126)
Anion gap: 10 (ref 5–15)
BUN: 15 mg/dL (ref 8–23)
CO2: 24 mmol/L (ref 22–32)
Calcium: 9.7 mg/dL (ref 8.9–10.3)
Chloride: 100 mmol/L (ref 98–111)
Creatinine: 0.59 mg/dL (ref 0.44–1.00)
GFR, Estimated: >60 mL/min (ref >60)
Glucose, Bld: 104 mg/dL — ABNORMAL HIGH (ref 70–99)
Potassium: 3.6 mmol/L (ref 3.5–5.1)
Sodium: 134 mmol/L — ABNORMAL LOW (ref 135–145)
Total Bilirubin: 0.3 mg/dL (ref 0.3–1.2)
Total Protein: 7.7 g/dL (ref 6.5–8.1)

## 2023-04-25 MED ORDER — PROCHLORPERAZINE MALEATE 10 MG PO TABS
10.0000 mg | ORAL_TABLET | Freq: Once | ORAL | Status: AC
  Administered 2023-04-25: 10 mg via ORAL
  Filled 2023-04-25: qty 1

## 2023-04-25 MED ORDER — SODIUM CHLORIDE 0.9 % IV SOLN
1000.0000 mg/m2 | Freq: Once | INTRAVENOUS | Status: AC
  Administered 2023-04-25: 1672 mg via INTRAVENOUS
  Filled 2023-04-25: qty 43.97

## 2023-04-25 MED ORDER — SODIUM CHLORIDE 0.9% FLUSH
10.0000 mL | Freq: Once | INTRAVENOUS | Status: AC
  Administered 2023-04-25: 10 mL via INTRAVENOUS
  Filled 2023-04-25: qty 10

## 2023-04-25 MED ORDER — HEPARIN SOD (PORK) LOCK FLUSH 100 UNIT/ML IV SOLN
500.0000 [IU] | Freq: Once | INTRAVENOUS | Status: AC | PRN
  Administered 2023-04-25: 500 [IU]
  Filled 2023-04-25: qty 5

## 2023-04-25 MED ORDER — SODIUM CHLORIDE 0.9 % IV SOLN
Freq: Once | INTRAVENOUS | Status: AC
  Filled 2023-04-25: qty 250

## 2023-04-25 NOTE — Progress Notes (Signed)
No concerns today 

## 2023-04-25 NOTE — Assessment & Plan Note (Addendum)
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  Progression noted on currently s/p  IPI-1 every 6 weeks+NIVO- every 3 weeks x3 cycles- CT JUNE 27th-  CT CAP-  Multiple new and enlarged bilateral pulmonary nodules, consistent with worsened pulmonary metastatic disease; Continued interval enlargement of very bulky metastases occupying the left lobe of the liver. Unchanged mixed lytic and sclerotic osseous metastatic lesion of the right scapular body.  Patient on gemcitabine chemotherapy.  # Proceed with gemcitabine chemotherapy- cycle #1- d-8- Labs-CBC/chemistries were reviewed with the patient.  Repeat imaging after cycle 2-3 cycles.   # Right temporal mass- ? Metastatic disease to bone- will repeat MRI brain.   # right foot drop sec to chemo-stable/improved- monitor for now. stable.  # Mild Hypokalemia-  stable.  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 10 fractions. OFF  fentanyl patch; continue  oxycontin 40 BID; ; and oxycodone as per palliative care. stable.    # weight loss/ loss of apetite/ nausea- on zyprexa qhs; add anti-emetic preemptively prn- stable  # constipation:  Miralax BID; fluids; Dulcolax-stable  # DM:  On Metformin-stable  # COPD: refilled albuterol; stable  #IV access: port functional- s/p TPA- functional  # DISPOSITION:  # MRI brain ASAP. # chemo today-  As per IS # follow up in 7/29 -  MD ; port-labs- cbc/cmp;LDH-- Gemciatibine-   # follow up in 8/05 MD - ; port-labs- cbc/cmp;LDH-- Gemciatibine- Dr.B

## 2023-04-25 NOTE — Patient Instructions (Signed)
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
## 2023-05-02 ENCOUNTER — Other Ambulatory Visit: Payer: Self-pay

## 2023-05-02 ENCOUNTER — Other Ambulatory Visit: Payer: Self-pay | Admitting: *Deleted

## 2023-05-02 MED ORDER — OXYCODONE-ACETAMINOPHEN 10-325 MG PO TABS
1.0000 | ORAL_TABLET | ORAL | 0 refills | Status: DC | PRN
  Filled 2023-05-02: qty 90, 8d supply, fill #0

## 2023-05-09 ENCOUNTER — Other Ambulatory Visit: Payer: Self-pay

## 2023-05-09 ENCOUNTER — Telehealth: Payer: Self-pay | Admitting: *Deleted

## 2023-05-09 ENCOUNTER — Inpatient Hospital Stay: Payer: Medicare Other

## 2023-05-09 ENCOUNTER — Encounter: Payer: Self-pay | Admitting: Internal Medicine

## 2023-05-09 ENCOUNTER — Inpatient Hospital Stay (HOSPITAL_BASED_OUTPATIENT_CLINIC_OR_DEPARTMENT_OTHER): Payer: Medicare Other | Admitting: Internal Medicine

## 2023-05-09 VITALS — BP 111/66 | HR 67

## 2023-05-09 DIAGNOSIS — C3411 Malignant neoplasm of upper lobe, right bronchus or lung: Secondary | ICD-10-CM

## 2023-05-09 DIAGNOSIS — G893 Neoplasm related pain (acute) (chronic): Secondary | ICD-10-CM

## 2023-05-09 DIAGNOSIS — Z5112 Encounter for antineoplastic immunotherapy: Secondary | ICD-10-CM | POA: Diagnosis not present

## 2023-05-09 LAB — CBC WITH DIFFERENTIAL (CANCER CENTER ONLY)
Abs Immature Granulocytes: 0.02 10*3/uL (ref 0.00–0.07)
Basophils Absolute: 0 10*3/uL (ref 0.0–0.1)
Basophils Relative: 1 %
Eosinophils Absolute: 0.1 10*3/uL (ref 0.0–0.5)
Eosinophils Relative: 1 %
HCT: 32.4 % — ABNORMAL LOW (ref 36.0–46.0)
Hemoglobin: 10.4 g/dL — ABNORMAL LOW (ref 12.0–15.0)
Immature Granulocytes: 0 %
Lymphocytes Relative: 33 %
Lymphs Abs: 1.9 10*3/uL (ref 0.7–4.0)
MCH: 28.9 pg (ref 26.0–34.0)
MCHC: 32.1 g/dL (ref 30.0–36.0)
MCV: 90 fL (ref 80.0–100.0)
Monocytes Absolute: 0.9 10*3/uL (ref 0.1–1.0)
Monocytes Relative: 15 %
Neutro Abs: 3 10*3/uL (ref 1.7–7.7)
Neutrophils Relative %: 50 %
Platelet Count: 285 10*3/uL (ref 150–400)
RBC: 3.6 MIL/uL — ABNORMAL LOW (ref 3.87–5.11)
RDW: 18.9 % — ABNORMAL HIGH (ref 11.5–15.5)
WBC Count: 5.9 10*3/uL (ref 4.0–10.5)
nRBC: 0 % (ref 0.0–0.2)

## 2023-05-09 LAB — CMP (CANCER CENTER ONLY)
ALT: 11 U/L (ref 0–44)
AST: 24 U/L (ref 15–41)
Albumin: 3.7 g/dL (ref 3.5–5.0)
Alkaline Phosphatase: 183 U/L — ABNORMAL HIGH (ref 38–126)
Anion gap: 9 (ref 5–15)
BUN: 19 mg/dL (ref 8–23)
CO2: 25 mmol/L (ref 22–32)
Calcium: 9.6 mg/dL (ref 8.9–10.3)
Chloride: 102 mmol/L (ref 98–111)
Creatinine: 0.55 mg/dL (ref 0.44–1.00)
GFR, Estimated: >60 mL/min (ref >60)
Glucose, Bld: 87 mg/dL (ref 70–99)
Potassium: 3.9 mmol/L (ref 3.5–5.1)
Sodium: 136 mmol/L (ref 135–145)
Total Bilirubin: 0.3 mg/dL (ref 0.3–1.2)
Total Protein: 7.3 g/dL (ref 6.5–8.1)

## 2023-05-09 LAB — LACTATE DEHYDROGENASE: LDH: 519 U/L — ABNORMAL HIGH (ref 98–192)

## 2023-05-09 MED ORDER — OXYCODONE-ACETAMINOPHEN 10-325 MG PO TABS
1.0000 | ORAL_TABLET | ORAL | 0 refills | Status: DC | PRN
Start: 2023-05-09 — End: 2023-05-16
  Filled 2023-05-09: qty 90, 8d supply, fill #0

## 2023-05-09 MED ORDER — HEPARIN SOD (PORK) LOCK FLUSH 100 UNIT/ML IV SOLN
500.0000 [IU] | Freq: Once | INTRAVENOUS | Status: AC | PRN
  Filled 2023-05-09: qty 5

## 2023-05-09 MED ORDER — SODIUM CHLORIDE 0.9 % IV SOLN
1000.0000 mg/m2 | Freq: Once | INTRAVENOUS | Status: AC
  Administered 2023-05-09: 1672 mg via INTRAVENOUS
  Filled 2023-05-09: qty 43.97

## 2023-05-09 MED ORDER — PROCHLORPERAZINE MALEATE 10 MG PO TABS
10.0000 mg | ORAL_TABLET | Freq: Once | ORAL | Status: AC
  Administered 2023-05-09: 10 mg via ORAL
  Filled 2023-05-09: qty 1

## 2023-05-09 MED ORDER — SODIUM CHLORIDE 0.9 % IV SOLN
Freq: Once | INTRAVENOUS | Status: AC
  Filled 2023-05-09: qty 250

## 2023-05-09 MED ORDER — HEPARIN SOD (PORK) LOCK FLUSH 100 UNIT/ML IV SOLN
INTRAVENOUS | Status: AC
  Filled 2023-05-09: qty 5

## 2023-05-09 MED ORDER — OXYCODONE HCL 5 MG PO TABS
10.0000 mg | ORAL_TABLET | Freq: Once | ORAL | Status: AC
  Administered 2023-05-09: 10 mg via ORAL
  Filled 2023-05-09: qty 2

## 2023-05-09 NOTE — Patient Instructions (Signed)
[facility]  Discharge Instructions: Thank you for choosing [facility] to provide your oncology and hematology care.  If you have a lab appointment with the [facility], please go directly to the [facility] and check in at the registration area.  Wear comfortable clothing and clothing appropriate for easy access to any Portacath or PICC line.   We strive to give you quality time with your provider. You may need to reschedule your appointment if you arrive late (15 or more minutes).  Arriving late affects you and other patients whose appointments are after yours.  Also, if you miss three or more appointments without notifying the office, you may be dismissed from the clinic at the provider's discretion.      For prescription refill requests, have your pharmacy contact our office and allow 72 hours for refills to be completed.    Today you received the following chemotherapy and/or immunotherapy agents gemzar    To help prevent nausea and vomiting after your treatment, we encourage you to take your nausea medication as directed.  BELOW ARE SYMPTOMS THAT SHOULD BE REPORTED IMMEDIATELY: *FEVER GREATER THAN 100.4 F (38 C) OR HIGHER *CHILLS OR SWEATING *NAUSEA AND VOMITING THAT IS NOT CONTROLLED WITH YOUR NAUSEA MEDICATION *UNUSUAL SHORTNESS OF BREATH *UNUSUAL BRUISING OR BLEEDING *URINARY PROBLEMS (pain or burning when urinating, or frequent urination) *BOWEL PROBLEMS (unusual diarrhea, constipation, pain near the anus) TENDERNESS IN MOUTH AND THROAT WITH OR WITHOUT PRESENCE OF ULCERS (sore throat, sores in mouth, or a toothache) UNUSUAL RASH, SWELLING OR PAIN  UNUSUAL VAGINAL DISCHARGE OR ITCHING   Items with * indicate a potential emergency and should be followed up as soon as possible or go to the Emergency Department if any problems should occur.  Please show the CHEMOTHERAPY ALERT CARD or IMMUNOTHERAPY ALERT CARD at check-in to the  Emergency Department and triage nurse.  Should you have questions after your visit or need to cancel or reschedule your appointment, please contact [facility]  [phone] and follow the prompts.  Office hours are 8:00 a.m. to 4:30 p.m. Monday - Friday. Please note that voicemails left after 4:00 p.m. may not be returned until the following business day.  We are closed weekends and major holidays. You have access to a nurse at all times for urgent questions. Please call the main number to the clinic [phone] and follow the prompts.  For any non-urgent questions, you may also contact your provider using MyChart. We now offer e-Visits for anyone 63 and older to request care online for non-urgent symptoms. For details visit [email or web address].   Also download the MyChart app! Go to the app store, search "MyChart", open the app, select [facility], and log in with your MyChart username and password.

## 2023-05-09 NOTE — Assessment & Plan Note (Addendum)
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  Progression noted on currently s/p  IPI-1 every 6 weeks+NIVO- every 3 weeks x3 cycles- CT JUNE 27th-  CT CAP-  Multiple new and enlarged bilateral pulmonary nodules, consistent with worsened pulmonary metastatic disease; Continued interval enlargement of very bulky metastases occupying the left lobe of the liver. Unchanged mixed lytic and sclerotic osseous metastatic lesion of the right scapular body.  Patient on gemcitabine chemotherapy.  # Proceed with gemcitabine chemotherapy- cycle #2- d-1- Labs-CBC/chemistries were reviewed with the patient.  Repeat imaging after cycle  3 cycles.   # Worsening dyspnea-  ambulatory pul Ox- 85; qualification of home O2.   # Right temporal mass- ? Metastatic disease to bone- ordered MRI brain.   # right foot drop sec to chemo-stable/improved- monitor for now. stable.  # Mild Hypokalemia-  stable.  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 10 fractions. OFF  fentanyl patch; continue  oxycontin 40 BID; and oxycodone as per palliative care; refilled. stable.    # weight loss/ loss of apetite/ nausea- on zyprexa qhs; add anti-emetic preemptively prn- stable.  # constipation:  Miralax BID; fluids; Dulcolax-stable.  # DM:  On Metformin-stable  # COPD: refilled albuterol; stable  #IV access: port functional- s/p TPA- functional  # DISPOSITION:  # schedule MRI brain ASAP. # chemo today-  As per IS # as planned- follow up in 8/05 MD - ; port-labs- cbc/cmp;LDH-- Gemciatibine- Dr.B

## 2023-05-09 NOTE — Telephone Encounter (Signed)
Faxed request for home oxygen to [facility] with demographics and medical information/ signed order form.

## 2023-05-09 NOTE — Progress Notes (Signed)
Patient complaining of increasing dyspnea with exertion/  walking. Sitting at rest O2 sat 96 %. Ambulated patient down hallway O2 sat began dropping to 84 %. Pt sat back down applied oxygen at 2 L via [place].O2 sat came up to 97 % after 3 mins. Ambulated with O2 on~ sat 90%. MD notified. Request orders for home oxygen. Concentrator and portable oxygen for visits to cancer center and treatments.

## 2023-05-09 NOTE — Progress Notes (Signed)
Pt asking for an oxygen machine.

## 2023-05-09 NOTE — Progress Notes (Signed)
[facility] CON[facility]ULT [facility]  Patient Care Team: Earna Coder, MD as PCP - General (Internal Medicine) Glory Buff, RN as Oncology Nurse Navigator  CHIEF COMPLAINTS/PURPOSE OF CONSULTATION: lung cancer  #  Oncology History Overview Note  # NOV -[id number] [facility] CANCER SCREENING PROGRAM]-18 mm right upper lobe lung nodule; DEC 2021- s/p right upper lobectomy [Dr. Dorris Fetch; [place]]; STAGE: I [pT-18 mm; LN-12=0]; predominant large cell neuroendocrine; minor adenocarcinoma.  DeclineD adjuvant chemotherapy.  #Recurrent/stage IV cancer NOV 2022- PET scan-scapular lesion liver lesion gastrohepatic lymphadenopathy.  11/21- start RT to right scapular lesion  # DEC 3rd, 2022- CARBO=ETOP+TECEN; udenyca #1; SEP 2023- STOP Tencetriq- progression  SEP 2023-liver biopsy shows neuroendocrine carcinoma/large cell; no adenocarcinoma noted.  Discontinue Tecentriq any progression of disease.  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   # OCTOBER, 2023-s /p carboplatin etoposide x4 cycles- without immunotherapy [progressed while on Atezo].  CT scan FEB 1st, 2024- Two new solid pulmonary nodules, largest 0.8 cm in the posterior left upper lobe, suspicious for new pulmonary metastases. Two enlarging left liver metastases, including substantial growth of the dominant bulky 9.7 cm segment 3 left liver metastasis. Stable expansile mixed lytic and sclerotic right scapular body metastasis.  # FEB 2024- Lubrinectidin cycle #3-progressive disease-noted; discontinue Lubrinectidin.   # MAY 3rd, 2024- IPI-1+NIVO-3 [KEPASAKE]- cycle #1- CT JUNE 27th-  CT CAP-  Multiple new and enlarged bilateral pulmonary nodules, consistent with worsened pulmonary metastatic disease; Continued interval enlargement of very bulky metastases occupying the left lobe of the liver. Unchanged mixed lytic and sclerotic osseous metastatic lesion of the right scapular body.  DISCONTINUE IPI+ NIVO  given the progressive disease.  # JULY 8th, 2024- Gemcitabine  # # MAY 2023- Right swollen eye/orbital inflammation/uveitis s/p Zometa status post steroids improved.-reviewed literature case reports noted; less concerning for tumor involvement.  DISCONTINUE ZOMETA.  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  Visceral Pleura Invasion: Not identified  Direct Invasion of Adjacent Structures: No adjacent structures present  Lymphovascular Invasion: Not identified  Margins: All margins negative for invasive carcinoma       Closest Margin(s) to Invasive Carcinoma: Bronchovascular margin  Treatment Effect: No known presurgical therapy  Regional Lymph Nodes:       Number of Lymph Nodes Involved: 0                            Nodal Sites with Tumor: Not applicable       Number of Lymph Nodes Examined: 12      Primary cancer of right upper lobe of lung (HCC)  11/03/2020 Initial Diagnosis   Primary cancer of right upper lobe of lung (HCC)   11/03/2020 Cancer Staging   Staging form: Lung, AJCC 8th Edition - Pathologic: Stage IA3 (pT1c, pN0, cM0) - Signed by Earna Coder, MD on 11/04/2020   09/14/2021 - 04/30/2022 Chemotherapy   Patient is on Treatment Plan : LUNG SCLC Carboplatin + Etoposide + Atezolizumab Induction q21d / Atezolizumab Maintenance q21d     09/15/2021 - 06/18/2022 Chemotherapy   Patient is on Treatment Plan : LUNG SCLC  Carboplatin + Etoposide + Atezolizumab Induction q21d x 4 cycles / Atezolizumab Maintenance q21d     11/09/2021 Cancer Staging   Staging form: Lung, AJCC 8th Edition - Pathologic: Stage IVB (pTX, pNX, cM1c) - Signed by  Earna Coder, MD on 11/09/2021   06/22/2022 Pathology Results   Liver biopsy showed metastatic cancer with neuroendocrine features    08/09/2022 - 10/22/2022 Chemotherapy   Patient is on Treatment Plan : BRAIN Carboplatin (AUC 5) D1 + Etoposide (120) D1-3 q28d     11/26/2022 - 01/10/2023 Chemotherapy   Patient is on Treatment Plan : LUNG SMALL CELL Lurbinectedin q21d     02/11/2023 - 03/25/2023 Chemotherapy   Patient is on Treatment Plan : MELANOMA Nivolumab (3) + Ipilimumab (1) q21d / Nivolumab (240) q14d     04/18/2023 -  Chemotherapy   Patient is on Treatment Plan : LUNG Gemcitabine (1000) D1,8 q21d      HISTORY OF PRESENTING ILLNESS: Ambulating independently.  Alone.   Stacie Kennedy 62 y.o.  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  Complains of ongoing right shoulder pain.  Patient says that her pain is stable on OxyContin with as needed Percocet.  On average, she is taking 2 Percocets q 3-4 hours daily.   Complains of worsening shortness of breath on exertion.  Complains of difficulty with ambulation especially with hot weather.  No worsening cough.  Mild intermittent nausea without any vomiting.   Complains of a lump around the right temple.  Also complains of dizziness. Chronic mild to moderate constipation. NO skin rash.   Review of Systems  Constitutional:  Positive for malaise/fatigue and weight loss. Negative for chills, diaphoresis and fever.  HENT:  Negative for nosebleeds and sore throat.   Eyes:  Negative for double vision.  Respiratory:  Negative for cough, hemoptysis, sputum production, shortness of breath and wheezing.   Cardiovascular:  Negative for chest pain, palpitations, orthopnea and leg swelling.  Gastrointestinal:  Positive for nausea and vomiting. Negative for abdominal pain, blood in stool, constipation, diarrhea, heartburn and melena.   Genitourinary:  Negative for dysuria, frequency and urgency.  Musculoskeletal:  Positive for back pain, joint pain and myalgias.  Skin: Negative.  Negative for itching and rash.  Neurological:  Negative for dizziness, tingling, focal weakness, weakness and headaches.  Endo/Heme/Allergies:  Does not bruise/bleed easily.  Psychiatric/Behavioral:  Negative for depression. The patient is not nervous/anxious and does not have insomnia.      MEDICAL HISTORY:  Past Medical History:  Diagnosis Date   Cancer [facility])    lung cancer   Depression    Duodenitis    Dyspnea    Family history of cancer    High cholesterol    Personal history of colonic polyps    Pre-diabetes    Umbilical hernia    UTI (urinary tract infection)     SURGICAL HISTORY: Past Surgical History:  Procedure Laterality Date   COLONOSCOPY WITH PROPOFOL N/A 03/24/2021   Procedure: COLONOSCOPY WITH PROPOFOL;  Surgeon: Wyline Mood, MD;  Location: [facility] ENDOSCOPY;  Service: Gastroenterology;  Laterality: N/A;   INTERCOSTAL NERVE BLOCK  09/19/2020   Procedure: INTERCOSTAL NERVE BLOCK;  Surgeon: Loreli Slot, MD;  Location[facility] OR;  Service: Thoracic;;   IR IMAGING GUIDED PORT INSERTION  09/23/2021   LUNG LOBECTOMY Right    LUNG REMOVAL, PARTIAL Right 09/19/2020   NODE DISSECTION Right 09/19/2020   Procedure:  NODE DISSECTION;  Surgeon: Loreli Slot, MD;  Location: [facility] OR;  Service: Thoracic;  Laterality: Right;   VIDEO BRONCHOSCOPY N/A 07/08/2021   Procedure: VIDEO BRONCHOSCOPY;  Surgeon: Loreli Slot, MD;  Location: [facility] OR;  Service: Thoracic;  Laterality: N/A;    SOCIAL HISTORY: Social History   Socioeconomic History   Marital status: Single    Spouse name: Not on file   Number of children: Not on file   Years of education: Not on file   Highest education level: Not on file  Occupational History   Not on file  Tobacco Use   Smoking status: Some Days    Current packs/day: 0.25    Average  packs/day: 0.3 packs/day for 43.0 years (10.8 ttl pk-yrs)    Types: Cigarettes   Smokeless tobacco: Never   Tobacco comments:    states she is slowing, trying to quit  Vaping Use   Vaping status: Never Used  Substance and Sexual Activity   Alcohol use: Yes    Alcohol/week: 2.0 standard drinks of alcohol    Types: 2 Cans of beer per week    Comment: occasional   Drug use: No   Sexual activity: Not on file  Other Topics Concern   Not on file  Social History Narrative   Lives in [place]; smokes; now and then beer; with boy friend. 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    Social Determinants of Health   Financial Resource Strain: High Risk (04/22/2022)   Overall Financial Resource Strain (CARDIA)    Difficulty of Paying Living Expenses: Hard  Food Insecurity: Food Insecurity Present (07/09/2021)   Hunger Vital Sign    Worried About Running Out of Food in the Last Year: Sometimes true    Ran Out of Food in the Last Year: Sometimes true  Transportation Needs: No Transportation Needs (07/09/2021)   PRAPARE - Administrator, Civil Service (Medical): No    Lack of Transportation (Non-Medical): No  Physical Activity: Insufficiently Active (04/16/2021)   Exercise Vital Sign    Days of Exercise per Week: 7 days    Minutes of Exercise per Session: 20 min  Stress: Stress Concern Present (04/22/2022)   [employer] of Occupational Health - Occupational Stress Questionnaire    Feeling of Stress : Very much  Social Connections: Socially Isolated (04/22/2022)   Social Connection and Isolation Panel [NHANES]    Frequency of Communication with Friends and Family: Once a week    Frequency of Social Gatherings with Friends and Family: Once a week    Attends Religious Services: Never    Database administrator or Organizations: No    Attends Banker Meetings: Never    Marital Status: Living with partner  Intimate Partner Violence: At Risk (04/22/2022)    Humiliation, Afraid, Rape, and Kick questionnaire    Fear of Current or Ex-Partner: No    Emotionally Abused: Yes    Physically Abused: No    Sexually Abused: No    FAMILY HISTORY: Family History  Problem Relation Age of Onset   Diabetes Mother    Cancer Mother    Diabetes Father    Stroke Father    Heart attack Father        28s   Healthy Sister    Cancer Brother    Hepatitis Brother    Diabetes Brother    Hypertension Brother    Heart disease Brother     ALLERGIES:  has No Known  Allergies.  MEDICATIONS:  Current Outpatient Medications  Medication Sig Dispense Refill   albuterol (PROVENTIL HFA) 108 (90 Base) MCG/ACT inhaler Inhale 2 puffs into the lungs once every 6 (six) hours as needed for wheezing or shortness of breath. 18 g 2   DULoxetine (CYMBALTA) 30 MG capsule Take 1 capsule (30 mg total) by mouth daily. For nausea, appetite, sleep 30 capsule 3   lidocaine-prilocaine (EMLA) cream Apply one application topically the the affected area(s) daily as needed. 30 g 3   loratadine (CLARITIN) 10 MG tablet Take 1 tablet (10 mg total) by mouth daily. 30 tablet 2   metFORMIN (GLUCOPHAGE) 500 MG tablet Take 1 tablet (500 mg total) by mouth daily with breakfast. 90 tablet 3   naloxone (NARCAN) nasal spray 4 mg/0.1 mL SPRAY 1 SPRAY INTO ONE NOSTRIL AS DIRECTED FOR OPIOID OVERDOSE (TURN PERSON ON SIDE AFTER DOSE. IF NO RESPONSE IN 2-3 MINUTES OR PERSON RESPONDS BUT RELAPSES, REPEAT USING A NEW SPRAY DEVICE AND SPRAY INTO THE OTHER NOSTRIL. CALL 911 AFTER USE.) * EMERGENCY USE ONLY * 2 each 0   OLANZapine (ZYPREXA) 5 MG tablet Take 1 tablet (5 mg total) by mouth at bedtime. 30 tablet 3   ondansetron (ZOFRAN) 4 MG tablet TAKE 2 TABLETS (8MG ) BY MOUTH ONCE EVERY 8 HOURS AS NEEDED FOR NAUSEA OR VOMITING. 80 tablet 1   oxyCODONE (OXYCONTIN) 40 mg 12 hr tablet Take 1 tablet (40 mg total) by mouth every 12 (twelve) hours. 60 tablet 0   potassium chloride SA (KLOR-CON M) 20 MEQ tablet Take 1  tablet (20 mEq total) by mouth 2 (two) times daily. 60 tablet 3   prochlorperazine (COMPAZINE) 10 MG tablet Take 1 tablet (10 mg total) by mouth once every 6 (six) hours as needed for nausea or vomiting. 40 tablet 1   lidocaine (LIDODERM) 5 % Place 1 patch onto the skin daily. Remove & Discard patch within 12 hours or as directed by MD (Patient not taking: Reported on 06/29/2022) 30 patch 2   oxyCODONE-acetaminophen (PERCOCET) 10-325 MG tablet Take 1-2 tablets by mouth every 4 (four) hours as needed for pain. 90 tablet 0   No current facility-administered medications for this visit.   Facility-Administered Medications Ordered in Other Visits  Medication Dose Route Frequency Provider Last Rate Last Admin   0.9 %  sodium chloride infusion   Intravenous Once Earna Coder, MD       gemcitabine (GEMZAR) 1,672 mg in sodium chloride 0.9 % 250 mL chemo infusion  1,000 mg/m2 (Treatment Plan Recorded) Intravenous Once Louretta Shorten R, MD       heparin lock flush 100 UNIT/ML injection            heparin lock flush 100 unit/mL  500 Units Intravenous Once Louretta Shorten R, MD       heparin lock flush 100 unit/mL  500 Units Intracatheter Once PRN Earna Coder, MD       morphine (PF) 2 MG/ML injection            prochlorperazine (COMPAZINE) tablet 10 mg  10 mg Oral Once Louretta Shorten R, MD       sodium chloride flush (NS) 0.9 % injection 10 mL  10 mL Intravenous PRN Earna Coder, MD   10 mL at 03/01/22 0857      .  PHYSICAL EXAMINATION: ECOG PERFORMANCE STATUS: 0 - Asymptomatic  Vitals:   05/09/23 1000  BP: (!) 95/57  Pulse: 81  Temp: 97.7 F (  36.5 C)  SpO2: 97%          Filed Weights   05/09/23 1000  Weight: 129 lb 12.8 oz (58.9 kg)      Hepatomegaly noted.   Physical Exam HENT:     Head: Normocephalic and atraumatic.     Mouth/Throat:     Pharynx: No oropharyngeal exudate.  Eyes:     Pupils: Pupils are equal, round, and reactive to  light.  Cardiovascular:     Rate and Rhythm: Normal rate and regular rhythm.  Pulmonary:     Effort: Pulmonary effort is normal. No respiratory distress.     Breath sounds: Normal breath sounds. No wheezing.  Abdominal:     General: Bowel sounds are normal. There is no distension.     Palpations: Abdomen is soft. There is no mass.     Tenderness: There is no abdominal tenderness. There is no guarding or rebound.  Musculoskeletal:        General: No tenderness. Normal range of motion.     Cervical back: Normal range of motion and neck supple.  Skin:    General: Skin is warm.  Neurological:     Mental Status: She is alert and oriented to person, place, and time.  Psychiatric:        Mood and Affect: Affect normal.      LABORATORY DATA:  I have reviewed the data as listed Lab Results  Component Value Date   WBC 5.9 05/09/2023   HGB 10.4 (L) 05/09/2023   HCT 32.4 (L) 05/09/2023   MCV 90.0 05/09/2023   PLT 285 05/09/2023   Recent Labs    04/18/23 1129 04/25/23 0837 05/09/23 1014  NA 136 134* 136  K 3.7 3.6 3.9  CL 102 100 102  CO2 25 24 25   GLUCOSE 106* 104* 87  BUN 14 15 19   CREATININE 0.45 0.59 0.55  CALCIUM 9.3 9.7 9.6  GFRNONAA >60 >60 >60  PROT 7.3 7.7 7.3  ALBUMIN 3.6 3.8 3.7  AST 27 24 24   ALT 11 11 11   ALKPHOS 190* 191* 183*  BILITOT 0.3 0.3 0.3    RADIOGRAPHIC STUDIES: I have personally reviewed the radiological images as listed and agreed with the findings in the report. No results found.   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  Progression noted on currently s/p  IPI-1 every 6 weeks+NIVO- every 3 weeks x3 cycles- CT JUNE 27th-  CT CAP-  Multiple new and enlarged bilateral pulmonary nodules, consistent with worsened pulmonary metastatic  disease; Continued interval enlargement of very bulky metastases occupying the left lobe of the liver. Unchanged mixed lytic and sclerotic osseous metastatic lesion of the right scapular body.  Patient on gemcitabine chemotherapy.  # Proceed with gemcitabine chemotherapy- cycle #2- d-1- Labs-CBC/chemistries were reviewed with the patient.  Repeat imaging after cycle  3 cycles.   # Worsening dyspnea-  ambulatory pul Ox- 85; qualification of home O2.   # Right temporal mass- ? Metastatic disease to bone- ordered MRI brain.   # right foot drop sec to chemo-stable/improved- monitor for now. stable.  # Mild Hypokalemia-  stable.  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 10 fractions. OFF  fentanyl patch; continue  oxycontin 40 BID; and oxycodone  as per palliative care; refilled. stable.    # weight loss/ loss of apetite/ nausea- on zyprexa qhs; add anti-emetic preemptively prn- stable.  # constipation:  Miralax BID; fluids; Dulcolax-stable.  # DM:  On Metformin-stable  # COPD: refilled albuterol; stable  #IV access: port functional- s/p TPA- functional  # DISPOSITION:  # schedule MRI brain ASAP. # chemo today-  As per IS # as planned- follow up in 8/05 MD - ; port-labs- cbc/cmp;LDH-- Gemciatibine- Dr.B       Earna Coder, MD 05/09/2023 11:40 AM

## 2023-05-10 ENCOUNTER — Other Ambulatory Visit: Payer: Self-pay | Admitting: *Deleted

## 2023-05-10 ENCOUNTER — Telehealth: Payer: Self-pay

## 2023-05-10 DIAGNOSIS — C3411 Malignant neoplasm of upper lobe, right bronchus or lung: Secondary | ICD-10-CM

## 2023-05-10 DIAGNOSIS — R0902 Hypoxemia: Secondary | ICD-10-CM

## 2023-05-10 NOTE — Telephone Encounter (Signed)
Ashly from [employer] for [place]. He states that patient is asking for a portable concentrator as well as nighttime concentrator. He states that if you call him back he can walk you through how to put the order in Epic. # is [phone].

## 2023-05-11 ENCOUNTER — Ambulatory Visit
Admission: RE | Admit: 2023-05-11 | Discharge: 2023-05-11 | Disposition: A | Discharge status: Home / Self Care | Payer: Medicare Other | Source: Ambulatory Visit | Attending: Internal Medicine | Admitting: Internal Medicine

## 2023-05-11 ENCOUNTER — Other Ambulatory Visit: Payer: Self-pay

## 2023-05-11 DIAGNOSIS — C3411 Malignant neoplasm of upper lobe, right bronchus or lung: Secondary | ICD-10-CM | POA: Insufficient documentation

## 2023-05-11 DIAGNOSIS — R42 Dizziness and giddiness: Secondary | ICD-10-CM | POA: Insufficient documentation

## 2023-05-11 MED ORDER — GADOBUTROL 1 MMOL/ML IV SOLN
6.0000 mL | Freq: Once | INTRAVENOUS | Status: AC | PRN
  Administered 2023-05-11: 6 mL via INTRAVENOUS

## 2023-05-13 ENCOUNTER — Other Ambulatory Visit: Payer: Self-pay | Admitting: *Deleted

## 2023-05-13 DIAGNOSIS — C3411 Malignant neoplasm of upper lobe, right bronchus or lung: Secondary | ICD-10-CM

## 2023-05-16 ENCOUNTER — Inpatient Hospital Stay: Payer: Medicare Other | Attending: Radiation Oncology

## 2023-05-16 ENCOUNTER — Inpatient Hospital Stay (HOSPITAL_BASED_OUTPATIENT_CLINIC_OR_DEPARTMENT_OTHER): Payer: Medicare Other | Admitting: Hospice and Palliative Medicine

## 2023-05-16 ENCOUNTER — Telehealth: Payer: Self-pay | Admitting: *Deleted

## 2023-05-16 ENCOUNTER — Inpatient Hospital Stay (HOSPITAL_BASED_OUTPATIENT_CLINIC_OR_DEPARTMENT_OTHER): Payer: Medicare Other | Admitting: Internal Medicine

## 2023-05-16 ENCOUNTER — Inpatient Hospital Stay: Payer: Medicare Other

## 2023-05-16 ENCOUNTER — Other Ambulatory Visit: Payer: Self-pay

## 2023-05-16 ENCOUNTER — Encounter: Payer: Self-pay | Admitting: Internal Medicine

## 2023-05-16 VITALS — BP 110/87 | HR 97 | Temp 97.7°F | Resp 18 | Ht 67.0 in | Wt 128.6 lb

## 2023-05-16 VITALS — BP 114/62 | HR 72 | Resp 16

## 2023-05-16 DIAGNOSIS — C3411 Malignant neoplasm of upper lobe, right bronchus or lung: Secondary | ICD-10-CM

## 2023-05-16 DIAGNOSIS — Z7984 Long term (current) use of oral hypoglycemic drugs: Secondary | ICD-10-CM | POA: Diagnosis not present

## 2023-05-16 DIAGNOSIS — R42 Dizziness and giddiness: Secondary | ICD-10-CM | POA: Insufficient documentation

## 2023-05-16 DIAGNOSIS — Z515 Encounter for palliative care: Secondary | ICD-10-CM | POA: Diagnosis not present

## 2023-05-16 DIAGNOSIS — C7B8 Other secondary neuroendocrine tumors: Secondary | ICD-10-CM | POA: Insufficient documentation

## 2023-05-16 DIAGNOSIS — E7800 Pure hypercholesterolemia, unspecified: Secondary | ICD-10-CM | POA: Diagnosis not present

## 2023-05-16 DIAGNOSIS — Z8719 Personal history of other diseases of the digestive system: Secondary | ICD-10-CM | POA: Diagnosis not present

## 2023-05-16 DIAGNOSIS — Z5111 Encounter for antineoplastic chemotherapy: Secondary | ICD-10-CM | POA: Diagnosis present

## 2023-05-16 DIAGNOSIS — I2510 Atherosclerotic heart disease of native coronary artery without angina pectoris: Secondary | ICD-10-CM | POA: Insufficient documentation

## 2023-05-16 DIAGNOSIS — R058 Other specified cough: Secondary | ICD-10-CM | POA: Diagnosis not present

## 2023-05-16 DIAGNOSIS — I700 Atherosclerosis of aorta: Secondary | ICD-10-CM | POA: Diagnosis not present

## 2023-05-16 DIAGNOSIS — M79601 Pain in right arm: Secondary | ICD-10-CM | POA: Insufficient documentation

## 2023-05-16 DIAGNOSIS — E119 Type 2 diabetes mellitus without complications: Secondary | ICD-10-CM | POA: Diagnosis not present

## 2023-05-16 DIAGNOSIS — Z79899 Other long term (current) drug therapy: Secondary | ICD-10-CM | POA: Insufficient documentation

## 2023-05-16 DIAGNOSIS — Z86010 Personal history of colonic polyps: Secondary | ICD-10-CM | POA: Insufficient documentation

## 2023-05-16 DIAGNOSIS — F17210 Nicotine dependence, cigarettes, uncomplicated: Secondary | ICD-10-CM | POA: Diagnosis not present

## 2023-05-16 DIAGNOSIS — J432 Centrilobular emphysema: Secondary | ICD-10-CM | POA: Insufficient documentation

## 2023-05-16 DIAGNOSIS — C7A1 Malignant poorly differentiated neuroendocrine tumors: Secondary | ICD-10-CM | POA: Diagnosis not present

## 2023-05-16 DIAGNOSIS — E876 Hypokalemia: Secondary | ICD-10-CM | POA: Diagnosis not present

## 2023-05-16 DIAGNOSIS — G893 Neoplasm related pain (acute) (chronic): Secondary | ICD-10-CM | POA: Insufficient documentation

## 2023-05-16 DIAGNOSIS — M21371 Foot drop, right foot: Secondary | ICD-10-CM | POA: Insufficient documentation

## 2023-05-16 DIAGNOSIS — K5900 Constipation, unspecified: Secondary | ICD-10-CM | POA: Insufficient documentation

## 2023-05-16 DIAGNOSIS — J449 Chronic obstructive pulmonary disease, unspecified: Secondary | ICD-10-CM | POA: Diagnosis not present

## 2023-05-16 DIAGNOSIS — M25511 Pain in right shoulder: Secondary | ICD-10-CM | POA: Diagnosis not present

## 2023-05-16 LAB — CBC WITH DIFFERENTIAL (CANCER CENTER ONLY)
Abs Immature Granulocytes: 0.05 10*3/uL (ref 0.00–0.07)
Basophils Absolute: 0 10*3/uL (ref 0.0–0.1)
Basophils Relative: 1 %
Eosinophils Absolute: 0 10*3/uL (ref 0.0–0.5)
Eosinophils Relative: 0 %
HCT: 33 % — ABNORMAL LOW (ref 36.0–46.0)
Hemoglobin: 10.8 g/dL — ABNORMAL LOW (ref 12.0–15.0)
Immature Granulocytes: 1 %
Lymphocytes Relative: 41 %
Lymphs Abs: 2.2 10*3/uL (ref 0.7–4.0)
MCH: 29 pg (ref 26.0–34.0)
MCHC: 32.7 g/dL (ref 30.0–36.0)
MCV: 88.5 fL (ref 80.0–100.0)
Monocytes Absolute: 0.9 10*3/uL (ref 0.1–1.0)
Monocytes Relative: 18 %
Neutro Abs: 2 10*3/uL (ref 1.7–7.7)
Neutrophils Relative %: 39 %
Platelet Count: 213 10*3/uL (ref 150–400)
RBC: 3.73 MIL/uL — ABNORMAL LOW (ref 3.87–5.11)
RDW: 18.8 % — ABNORMAL HIGH (ref 11.5–15.5)
WBC Count: 5.2 10*3/uL (ref 4.0–10.5)
nRBC: 0 % (ref 0.0–0.2)

## 2023-05-16 LAB — CMP (CANCER CENTER ONLY)
ALT: 11 U/L (ref 0–44)
AST: 24 U/L (ref 15–41)
Albumin: 3.5 g/dL (ref 3.5–5.0)
Alkaline Phosphatase: 193 U/L — ABNORMAL HIGH (ref 38–126)
Anion gap: 11 (ref 5–15)
BUN: 10 mg/dL (ref 8–23)
CO2: 24 mmol/L (ref 22–32)
Calcium: 9.3 mg/dL (ref 8.9–10.3)
Chloride: 100 mmol/L (ref 98–111)
Creatinine: 0.55 mg/dL (ref 0.44–1.00)
GFR, Estimated: >60 mL/min (ref >60)
Glucose, Bld: 98 mg/dL (ref 70–99)
Potassium: 3.4 mmol/L — ABNORMAL LOW (ref 3.5–5.1)
Sodium: 135 mmol/L (ref 135–145)
Total Bilirubin: 0.2 mg/dL — ABNORMAL LOW (ref 0.3–1.2)
Total Protein: 7.1 g/dL (ref 6.5–8.1)

## 2023-05-16 LAB — LACTATE DEHYDROGENASE: LDH: 469 U/L — ABNORMAL HIGH (ref 98–192)

## 2023-05-16 MED ORDER — OLANZAPINE 5 MG PO TABS
5.0000 mg | ORAL_TABLET | Freq: Every day | ORAL | 3 refills | Status: DC
  Filled 2023-05-16: qty 30, 30d supply, fill #0

## 2023-05-16 MED ORDER — OLANZAPINE 5 MG PO TABS
5.0000 mg | ORAL_TABLET | Freq: Every day | ORAL | 3 refills | Status: DC
  Filled 2023-05-16: qty 30, 30d supply, fill #0
  Filled 2023-07-11: qty 30, 30d supply, fill #1

## 2023-05-16 MED ORDER — SODIUM CHLORIDE 0.9 % IV SOLN
Freq: Once | INTRAVENOUS | Status: AC
  Filled 2023-05-16: qty 250

## 2023-05-16 MED ORDER — HEPARIN SOD (PORK) LOCK FLUSH 100 UNIT/ML IV SOLN
500.0000 [IU] | Freq: Once | INTRAVENOUS | Status: AC | PRN
  Administered 2023-05-16: 500 [IU]
  Filled 2023-05-16: qty 5

## 2023-05-16 MED ORDER — LIDOCAINE-PRILOCAINE 2.5-2.5 % EX CREA
1.0000 | TOPICAL_CREAM | CUTANEOUS | 3 refills | Status: DC | PRN
  Filled 2023-05-16: qty 30, 20d supply, fill #0

## 2023-05-16 MED ORDER — SODIUM CHLORIDE 0.9% FLUSH
10.0000 mL | INTRAVENOUS | Status: DC | PRN
  Filled 2023-05-16: qty 10

## 2023-05-16 MED ORDER — PROCHLORPERAZINE MALEATE 10 MG PO TABS
10.0000 mg | ORAL_TABLET | Freq: Once | ORAL | Status: AC
  Administered 2023-05-16: 10 mg via ORAL
  Filled 2023-05-16: qty 1

## 2023-05-16 MED ORDER — SODIUM CHLORIDE 0.9 % IV SOLN
1000.0000 mg/m2 | Freq: Once | INTRAVENOUS | Status: AC
  Administered 2023-05-16: 1672 mg via INTRAVENOUS
  Filled 2023-05-16: qty 17.67

## 2023-05-16 MED ORDER — ONDANSETRON HCL 4 MG PO TABS
ORAL_TABLET | ORAL | 1 refills | Status: DC
  Filled 2023-05-16: qty 80, 18d supply, fill #0

## 2023-05-16 MED ORDER — HYDROMORPHONE HCL 2 MG PO TABS
2.0000 mg | ORAL_TABLET | ORAL | 0 refills | Status: DC | PRN
Start: 2023-05-16 — End: 2023-05-24
  Filled 2023-05-16: qty 45, 4d supply, fill #0

## 2023-05-16 NOTE — Progress Notes (Signed)
Virtual Visit via Telephone Note  I connected with Stacie Kennedy on 05/16/23 at  3:00 PM EDT by telephone and verified that I am speaking with the correct person using two identifiers.  Location: Patient: Home Provider: Clinic   I discussed the limitations, risks, security and privacy concerns of performing an evaluation and management service by telephone and the availability of in person appointments. I also discussed with the patient that there may be a patient responsible charge related to this service. The patient expressed understanding and agreed to proceed.   History of Present Illness: Stacie Kennedy is a 62 y.o. 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  Patient has had severe pain with scapular metastases and was started on XRT.  She is referred to palliative care to help address goals and manage ongoing symptoms.    Observations/Objective: Add on telephone visit per Dr. Sharlette Dense request to address pain.  Patient was seen today by Dr. Donneta Romberg.  Patient pending repeat CTs but symptoms concerning for disease progression.  Patient status post multiple previous lines of chemotherapy and unlikely to have additional options for treatment.  Hospice was recommended but patient not agreeable.  Symptomatically, she endorses poorly controlled pain.  She is on OxyContin 40 mg every 12 hours and takes 2 Percocet every 4 hours around-the-clock.  She says that pain often rates 10 out of 10.  He will ease slightly after taking the Percocet but she still rates pain as severe.  She denies any adverse effects from pain medication.  Assessment and Plan: Stage IV lung cancer -pending repeat CTs.  May benefit from hospice care.  Neoplasm related pain -continue OxyContin.  Will rotate from Percocet  to hydromorphone for breakthrough pain.  Could consider increasing dose or frequency of OxyContin if needed.  Case and plan discussed with Dr. Donneta Romberg  Follow Up Instructions: Follow-up telephone visit 1 to 2 weeks   I discussed the assessment and treatment plan with the patient. The patient was provided an opportunity to ask questions and all were answered. The patient agreed with the plan and demonstrated an understanding of the instructions.   The patient was advised to call back or seek an in-person evaluation if the symptoms worsen or if the condition fails to improve as anticipated.  I provided 10 minutes of non-face-to-face time during this encounter.   Malachy Moan, NP
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
## 2023-05-16 NOTE — Assessment & Plan Note (Addendum)
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  Progression noted on currently s/p  IPI-1 every 6 weeks+NIVO- every 3 weeks x3 cycles- CT JUNE 27th-  CT CAP-  Multiple new and enlarged bilateral pulmonary nodules, consistent with worsened pulmonary metastatic disease; Continued interval enlargement of very bulky metastases occupying the left lobe of the liver. Unchanged mixed lytic and sclerotic osseous metastatic lesion of the right scapular body.  Patient on gemcitabine chemotherapy.  # Proceed with gemcitabine chemotherapy- cycle #2- d-8 Labs-CBC/chemistries were reviewed with the patient.  Repeat imaging after cycle  2 cycles. CT ordered today,   # # COPD: refilled albuterol;-  ambulatory pul Ox- 85; qualification of home O2- stable.  # Right temporal mass- ? Metastatic disease to bone- awaiting the MRI brain.   # right foot drop sec to chemo-stable/improved- monitor for now. stable.  # Mild Hypokalemia-  stable.  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 10 fractions. OFF  fentanyl patch; continue  oxycontin 40 BID; and oxycodone as per palliative care;- poorly controlled- Discussed with palliative care re: alternative medications.   # weight loss/ loss of apetite/ nausea- on zyprexa qhs; add anti-emetic preemptively prn- stable.  # constipation:  Miralax BID; fluids; Dulcolax-stable.  # DM:  On Metformin-stable.  #IV access: port functional- s/p TPA- functional  # DISPOSITION:  # chemo today-  As per IS # as planned- follow up in 2 MD - ; port-labs- cbc/cmp;LDH-- Gemciatibine; CT CAP prior-- Dr.B

## 2023-05-16 NOTE — Patient Instructions (Signed)
[facility]  Discharge Instructions: Thank you for choosing [facility] to provide your oncology and hematology care.  If you have a lab appointment with the [facility], please go directly to the [facility] and check in at the registration area.  Wear comfortable clothing and clothing appropriate for easy access to any Portacath or PICC line.   We strive to give you quality time with your provider. You may need to reschedule your appointment if you arrive late (15 or more minutes).  Arriving late affects you and other patients whose appointments are after yours.  Also, if you miss three or more appointments without notifying the office, you may be dismissed from the clinic at the provider's discretion.      For prescription refill requests, have your pharmacy contact our office and allow 72 hours for refills to be completed.    Today you received the following chemotherapy and/or immunotherapy agents gemzar    To help prevent nausea and vomiting after your treatment, we encourage you to take your nausea medication as directed.  BELOW ARE SYMPTOMS THAT SHOULD BE REPORTED IMMEDIATELY: *FEVER GREATER THAN 100.4 F (38 C) OR HIGHER *CHILLS OR SWEATING *NAUSEA AND VOMITING THAT IS NOT CONTROLLED WITH YOUR NAUSEA MEDICATION *UNUSUAL SHORTNESS OF BREATH *UNUSUAL BRUISING OR BLEEDING *URINARY PROBLEMS (pain or burning when urinating, or frequent urination) *BOWEL PROBLEMS (unusual diarrhea, constipation, pain near the anus) TENDERNESS IN MOUTH AND THROAT WITH OR WITHOUT PRESENCE OF ULCERS (sore throat, sores in mouth, or a toothache) UNUSUAL RASH, SWELLING OR PAIN  UNUSUAL VAGINAL DISCHARGE OR ITCHING   Items with * indicate a potential emergency and should be followed up as soon as possible or go to the Emergency Department if any problems should occur.  Please show the CHEMOTHERAPY ALERT CARD or IMMUNOTHERAPY ALERT CARD at check-in to the  Emergency Department and triage nurse.  Should you have questions after your visit or need to cancel or reschedule your appointment, please contact [facility]  [phone] and follow the prompts.  Office hours are 8:00 a.m. to 4:30 p.m. Monday - Friday. Please note that voicemails left after 4:00 p.m. may not be returned until the following business day.  We are closed weekends and major holidays. You have access to a nurse at all times for urgent questions. Please call the main number to the clinic [phone] and follow the prompts.  For any non-urgent questions, you may also contact your provider using MyChart. We now offer e-Visits for anyone 63 and older to request care online for non-urgent symptoms. For details visit [email or web address].   Also download the MyChart app! Go to the app store, search "MyChart", open the app, select [facility], and log in with your MyChart username and password.

## 2023-05-16 NOTE — Progress Notes (Signed)
Oxygen ha been delivered to her home. Fatigued. Productive cough, clear to yellow. Denies pain. No appetite. Eating small bland meals. No meats. Encourage more liquids.

## 2023-05-16 NOTE — Telephone Encounter (Signed)
Refills sent in

## 2023-05-18 ENCOUNTER — Telehealth: Payer: Self-pay | Admitting: *Deleted

## 2023-05-18 ENCOUNTER — Encounter: Payer: Self-pay | Admitting: *Deleted

## 2023-05-18 ENCOUNTER — Other Ambulatory Visit: Payer: Self-pay

## 2023-05-18 NOTE — Telephone Encounter (Signed)
Stacie Kennedy (Key: [id number]) PA Case ID #: [id number] PA submitted for pt's dilaudid.

## 2023-05-18 NOTE — Telephone Encounter (Signed)
Josh- fyi- regarding Quantity max limit of 180 tablets per 30 days.   Outcome Approved today by [facility] Medicaid 2017 Approved. The requested medication is covered on the formulary with a Quantity Limit (QL) of 180 tablets per 30 days. Since this request is within the QL, it does not require a Coverage Determination Request. Please call the pharmacy to process the prescription claim. Authorization Expiration Date: 10/11/2023

## 2023-05-19 ENCOUNTER — Telehealth: Payer: Self-pay | Admitting: *Deleted

## 2023-05-19 ENCOUNTER — Encounter: Payer: Self-pay | Admitting: Internal Medicine

## 2023-05-19 IMAGING — MR MR HEAD WO/W CM
15 series · 48 of 48 positions shown · IV contrast (7 ml Gadavist)
Comparison: None.

CLINICAL DATA: Diplopia metastatic lung cancer

EXAM:
MRI HEAD WITHOUT AND WITH CONTRAST
TECHNIQUE: Multiplanar, multiecho pulse sequences of the brain and surrounding
structures were obtained without and with intravenous contrast.
CONTRAST:  7mL GADAVIST GADOBUTROL 1 MMOL/ML IV SOLN

[Series 5: ax dwi_tracew · axial · 3.0mm · 0.65mm/px · z∈[-125,+24]mm · 4 of 48 slices shown]
[im 1/48]
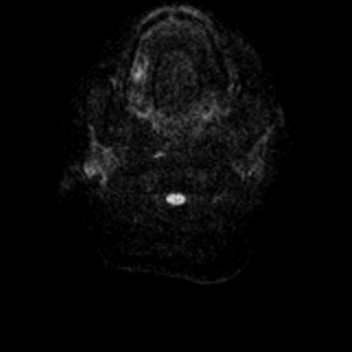
[im 16/48]
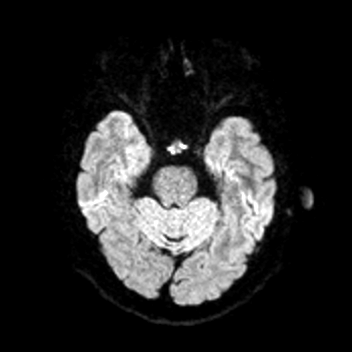
[im 32/48]
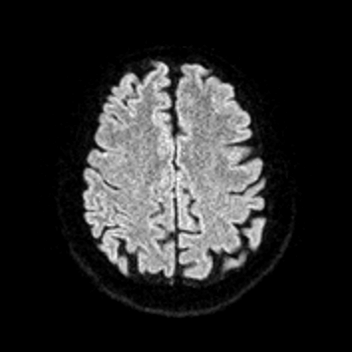
[im 48/48]
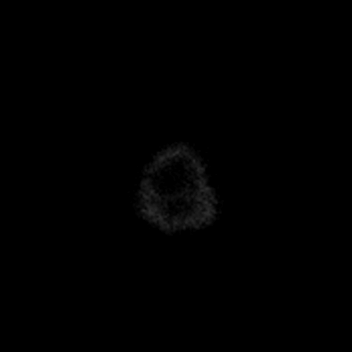

[Series 6: ax dwi_adc · axial · 3.0mm · 0.65mm/px · z∈[-125,+21]mm · 3 of 47 slices shown]
[im 1/47]
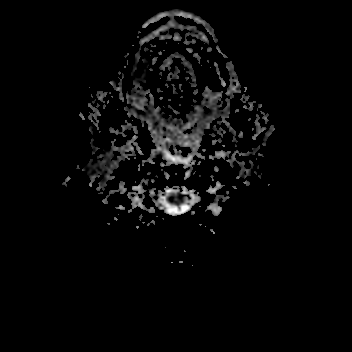
[im 24/47]
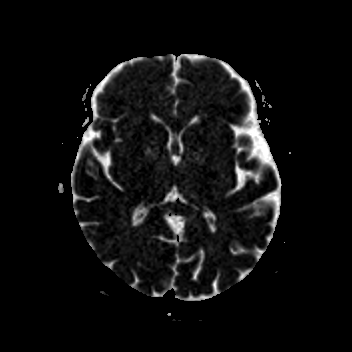
[im 47/47]
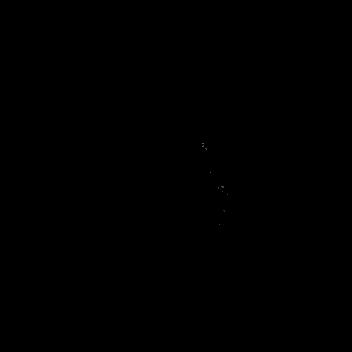

[Series 7: cor dwi_tracew · coronal · 5.0mm · 0.68mm/px · 2 of 40 slices shown]
[im 1/40]
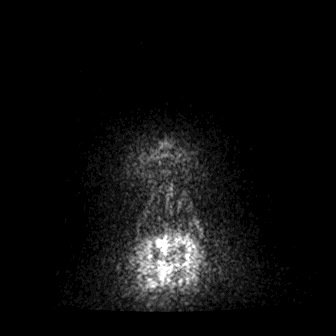
[im 40/40]
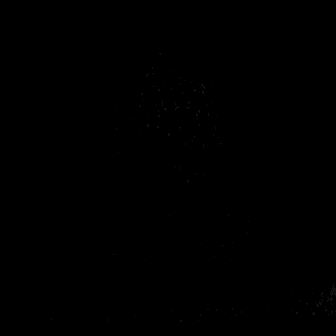

[Series 8: cor dwi_adc · coronal · 5.0mm · 0.68mm/px · 2 of 38 slices shown]
[im 1/38]
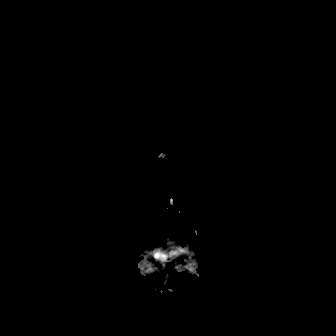
[im 38/38]
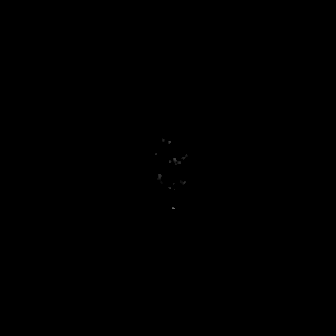

[Series 9: T1 · sagittal · 5.0mm · 0.62mm/px · 1 of 25 slices shown (1 of 2)]
[im 1/25]
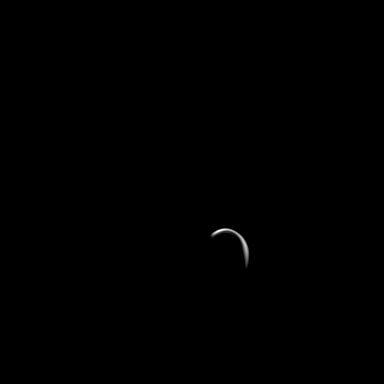

[Series 10: T2 · axial · 5.0mm · 0.53mm/px · 1 of 26 slices shown (1 of 2)]
[im 1/26]
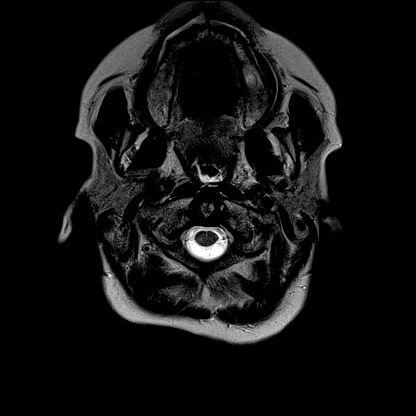

[Series 11: mag_images · axial · 3.0mm · 0.90mm/px · z∈[-138,+32]mm · 3 of 60 slices shown]
[im 1/60]
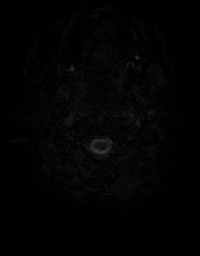
[im 30/60]
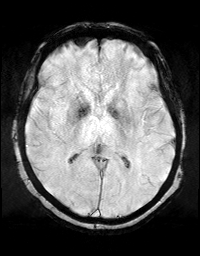
[im 60/60]
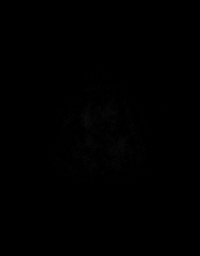

[Series 12: pha_images · axial · 3.0mm · 0.90mm/px · z∈[-138,+30]mm · 3 of 59 slices shown]
[im 1/59]
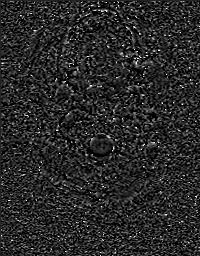
[im 30/59]
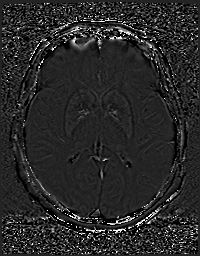
[im 59/59]
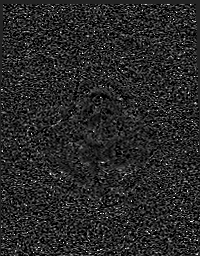

[Series 13: swi_images · axial · 3.0mm · 0.90mm/px · z∈[-138,+32]mm · 3 of 60 slices shown]
[im 1/60]
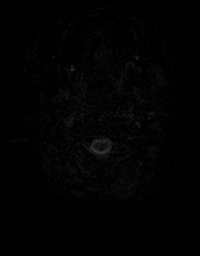
[im 30/60]
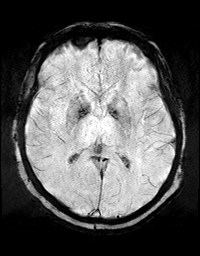
[im 60/60]
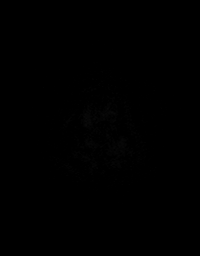

[Series 15: FLAIR · axial · 3.0mm · 0.53mm/px · z∈[-135,+20]mm · 3 of 55 slices shown]
[im 1/55]
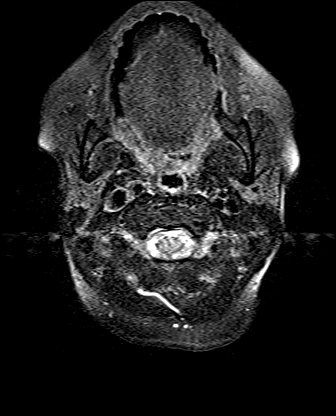
[im 28/55]
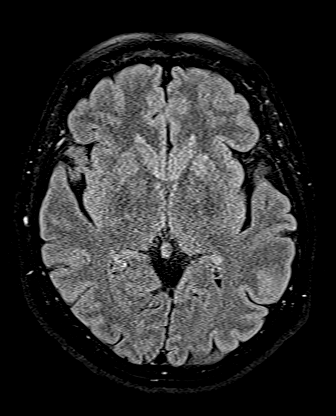
[im 55/55]
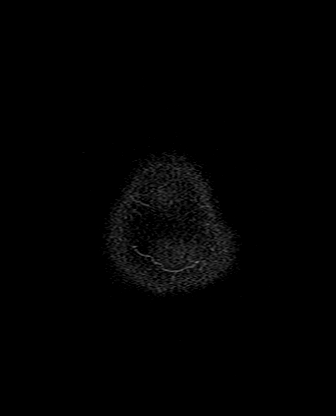

[Series 16: T1 · axial · 1.0mm · 0.98mm/px · z∈[-139,+29]mm · 9 of 176 slices shown (2 of 2)]
[im 1/176]
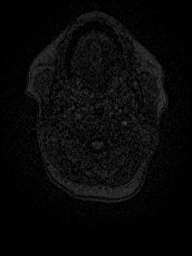
[im 22/176]
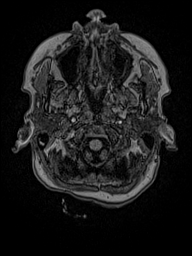
[im 44/176]
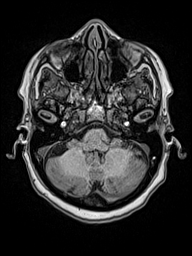
[im 66/176]
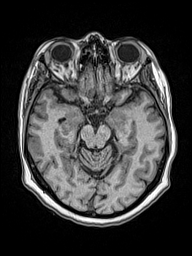
[im 88/176]
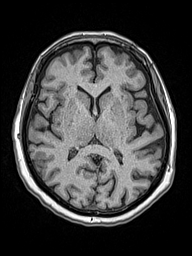
[im 110/176]
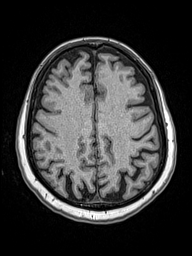
[im 132/176]
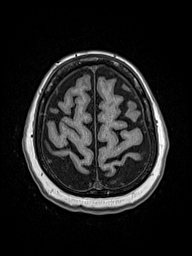
[im 154/176]
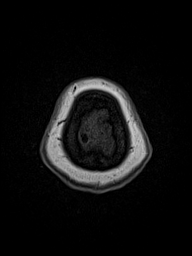
[im 176/176]
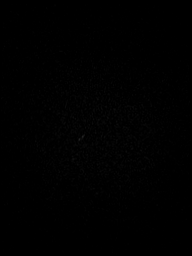

[Series 17: T2 · coronal · 5.0mm · 0.57mm/px · 2 of 29 slices shown (2 of 2)]
[im 1/29]
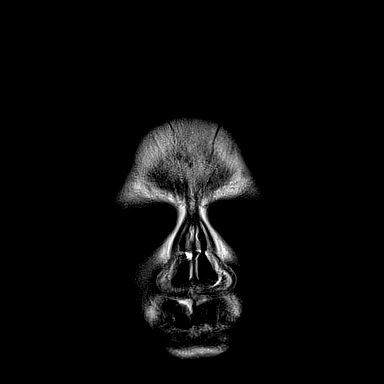
[im 29/29]
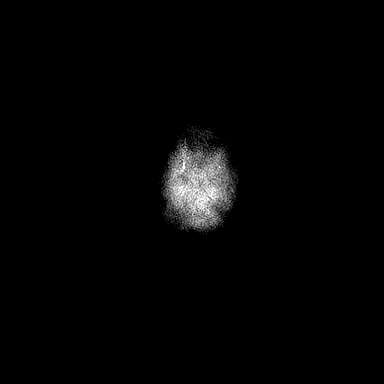

[Series 18: T1 post-contrast · sagittal · 5.0mm · 0.62mm/px · 1 of 25 slices shown (1 of 3)]
[im 1/25]
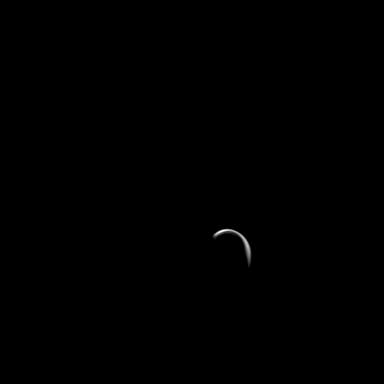

[Series 19: T1 post-contrast · coronal · 5.0mm · 0.57mm/px · 2 of 29 slices shown (2 of 3)]
[im 1/29]
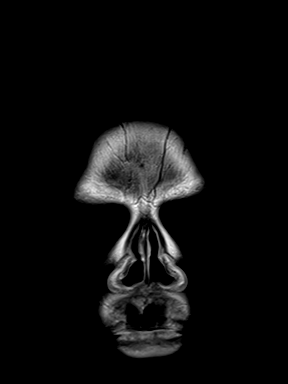
[im 29/29]
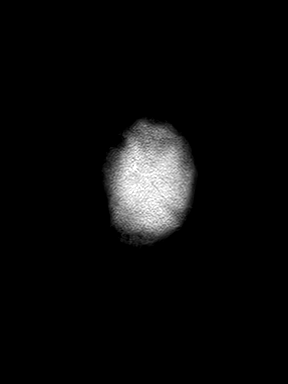

[Series 20: T1 post-contrast · axial · 1.0mm · 0.98mm/px · z∈[-139,+29]mm · 9 of 176 slices shown (3 of 3)]
[im 1/176]
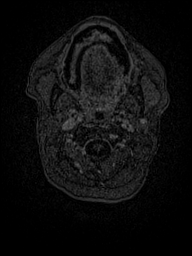
[im 22/176]
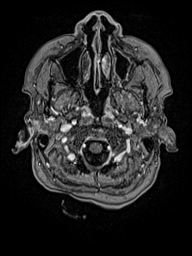
[im 44/176]
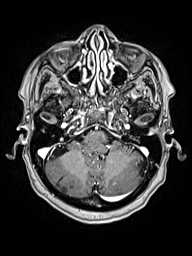
[im 66/176]
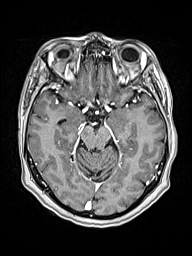
[im 88/176]
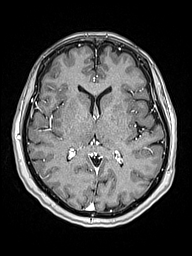
[im 110/176]
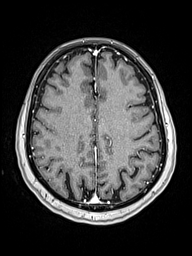
[im 132/176]
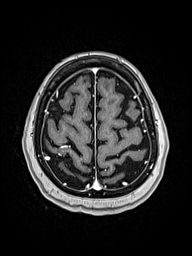
[im 154/176]
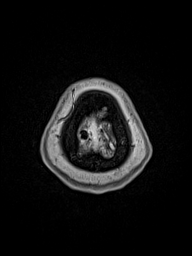
[im 176/176]
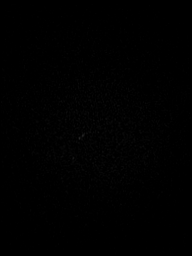

[48 of 48 positions shown; findings below may reference images not displayed]

FINDINGS: Brain: Punctate focus of nonenhancing DWI hyperintensity in the high
left parietal cortex (series 5, image 30; series 7, image 11). No
mass effect or edema. No definite ADC correlate or enhancement. No
abnormal enhancement in the remainder of brain. Remote small
bilateral cerebellar infarcts. No extra-axial fluid collection,
abnormal mass effect, or midline shift. No hydrocephalus. No acute
hemorrhage

Vascular: Major arterial flow voids are maintained skull base.

Skull and upper cervical spine: Normal marrow signal.

Sinuses/Orbits: Mild-to-moderate paranasal sinus mucosal thickening.

Other: No mastoid effusions
IMPRESSION: 1. Punctate focus of nonenhancing DWI hyperintensity in the high
left parietal cortex without mass effect or edema. This could
represent a tiny acute/subacute infarct or artifact. Although
thought less likely, tiny nonenhancing metastatic lesion is not
excluded in the setting of known metastatic lung cancer. Follow-up
MRI with contrast in 3-4 weeks is recommended to ensure expected
evolution/resolution.
2. Remote small bilateral cerebellar infarcts.

## 2023-05-19 NOTE — Telephone Encounter (Signed)
Initiated Coverage review of special circumstances with [facility] for EMLA cream. Ticket # [phone].

## 2023-05-23 ENCOUNTER — Other Ambulatory Visit: Payer: Self-pay

## 2023-05-23 ENCOUNTER — Ambulatory Visit
Admission: RE | Admit: 2023-05-23 | Discharge: 2023-05-23 | Disposition: A | Discharge status: Home / Self Care | Payer: Medicare Other | Source: Ambulatory Visit | Attending: Internal Medicine | Admitting: Internal Medicine

## 2023-05-23 ENCOUNTER — Encounter: Payer: Self-pay | Admitting: Internal Medicine

## 2023-05-23 ENCOUNTER — Telehealth: Payer: Self-pay | Admitting: *Deleted

## 2023-05-23 ENCOUNTER — Other Ambulatory Visit: Payer: Self-pay | Admitting: *Deleted

## 2023-05-23 DIAGNOSIS — C3411 Malignant neoplasm of upper lobe, right bronchus or lung: Secondary | ICD-10-CM | POA: Diagnosis not present

## 2023-05-23 MED ORDER — IOHEXOL 300 MG/ML  SOLN
100.0000 mL | Freq: Once | INTRAMUSCULAR | Status: AC | PRN
  Administered 2023-05-23: 100 mL via INTRAVENOUS

## 2023-05-23 MED ORDER — OXYCODONE HCL ER 40 MG PO T12A
40.0000 mg | EXTENDED_RELEASE_TABLET | Freq: Two times a day (BID) | ORAL | 0 refills | Status: DC
Start: 2023-05-23 — End: 2023-06-14
  Filled 2023-05-23 (×2): qty 60, 30d supply, fill #0

## 2023-05-23 NOTE — Telephone Encounter (Signed)
Outcome Approved today by [facility] Medicare 2017 Approved. This drug has been approved under the Member's Medicare Part D benefit. Approved quantity: 60 units per 30 day(s). You may fill up to a 90 day supply except for those on Specialty Tier 5, which can be filled up to a 30 day supply. Please call the pharmacy to process the prescription claim.

## 2023-05-23 NOTE — Telephone Encounter (Signed)
Submitted PA for Stacie Kennedy (Key: [id number]). OxyCONTIN 40MG  er tablets via covermymeds for [facility] Electronic Prior Authorization

## 2023-05-24 ENCOUNTER — Telehealth: Payer: Self-pay | Admitting: *Deleted

## 2023-05-24 ENCOUNTER — Inpatient Hospital Stay (HOSPITAL_BASED_OUTPATIENT_CLINIC_OR_DEPARTMENT_OTHER): Payer: Medicare Other | Admitting: Hospice and Palliative Medicine

## 2023-05-24 ENCOUNTER — Other Ambulatory Visit: Payer: Self-pay | Admitting: *Deleted

## 2023-05-24 ENCOUNTER — Other Ambulatory Visit: Payer: Self-pay

## 2023-05-24 DIAGNOSIS — C3411 Malignant neoplasm of upper lobe, right bronchus or lung: Secondary | ICD-10-CM | POA: Diagnosis not present

## 2023-05-24 DIAGNOSIS — G893 Neoplasm related pain (acute) (chronic): Secondary | ICD-10-CM | POA: Diagnosis not present

## 2023-05-24 MED ORDER — OXYCODONE-ACETAMINOPHEN 10-325 MG PO TABS
1.0000 | ORAL_TABLET | ORAL | 0 refills | Status: DC | PRN
Start: 2023-05-24 — End: 2023-05-24
  Filled 2023-05-24: qty 84, 7d supply, fill #0

## 2023-05-24 MED ORDER — OXYCODONE HCL 15 MG PO TABS
15.0000 mg | ORAL_TABLET | ORAL | 0 refills | Status: DC | PRN
  Filled 2023-05-24: qty 60, 5d supply, fill #0

## 2023-05-24 NOTE — Telephone Encounter (Signed)
Pt left message stating that they new pain medication is not helping and she would prefer to rotate back to percocet for breakthrough pain.   Please advise.

## 2023-05-24 NOTE — Progress Notes (Signed)
Virtual Visit via Telephone Note  I connected with Stacie Kennedy on 05/24/23 at  3:05 PM EDT by telephone and verified that I am speaking with the correct person using two identifiers.  Location: Patient: Home Provider: Clinic   I discussed the limitations, risks, security and privacy concerns of performing an evaluation and management service by telephone and the availability of in person appointments. I also discussed with the patient that there may be a patient responsible charge related to this service. The patient expressed understanding and agreed to proceed.   History of Present Illness: Stacie Kennedy is a 62 y.o. 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  Patient has had severe pain with scapular metastases and was started on XRT.  She is referred to palliative care to help address goals and manage ongoing symptoms.    Observations/Objective: Spoke with patient by phone.  She reports poorly controlled right arm pain.  She has not filled hydromorphone has helped and requests rotating back to Percocet.  However, patient had poorly controlled pain on previous dosing of Percocet.  Discussed rotation to oxycodone IR with liberalized dosing.  Will send new prescription.  Patient describes pain in the mid right arm.  She is known to have scapular mass which shows previously radiated.   CT of the chest abdomen and pelvis yesterday revealed mildly progressive pulmonary metastasis, smaller right scapula osseous metastasis and no other new osseous metastasis identified.  Given pain in right arm, would recommend MR humerus.  Will discuss with oncology.  Assessment and Plan: Stage IV lung cancer -progressive pulmonary metastasis on CT.  Patient pending follow-up with Dr. Donneta Romberg. Unfortunately,  systemic options are limited. Hospice would be reasonable.   Neoplasm related pain -continue OxyContin.  Will rotate from Percocet to her last dose of oxycodone IR for breakthrough pain.  Discussed with Dr. B who recommended Xray prior to considering further imaging. Could consider XRT if needed.   Case and plan discussed with Dr. Donneta Romberg  Follow Up Instructions: Follow-up telephone visit 1 to 2 weeks   I discussed the assessment and treatment plan with the patient. The patient was provided an opportunity to ask questions and all were answered. The patient agreed with the plan and demonstrated an understanding of the instructions.   The patient was advised to call back or seek an in-person evaluation if the symptoms worsen or if the condition fails to improve as anticipated.  I provided 10 minutes of non-face-to-face time during this encounter.   Malachy Moan, NP

## 2023-05-25 ENCOUNTER — Telehealth: Payer: Self-pay | Admitting: *Deleted

## 2023-05-25 ENCOUNTER — Other Ambulatory Visit: Payer: Self-pay

## 2023-05-25 NOTE — Telephone Encounter (Signed)
Message left with Stacie Kennedy informing that Josh and Dr. B would like for her to have an xray of her right arm to further evaluate her pain. Instructed Stacie Kennedy to arrive at the [facility] or [facility] to have xray performed during day time business hours, no appt needed. Encouraged Stacie Kennedy to have xray completed this week when she is able. Instructed Stacie Kennedy to call back with any questions or needs.

## 2023-05-27 IMAGING — US IR IMAGING GUIDED PORT INSERTION
1 series · 1 of 1 positions shown · non-contrast
Comparison: none

CLINICAL DATA: Non-small cell lung carcinoma post right upper
lobectomy
TECHNIQUE: The procedure, risks, benefits, and alternatives were explained to
the patient. Questions regarding the procedure were encouraged and
answered. The patient understands and consents to the procedure.

[Series 1: ir imaging guided port insertion · 0.07mm/px · 1 of 1 slices shown]
[im 1/1]
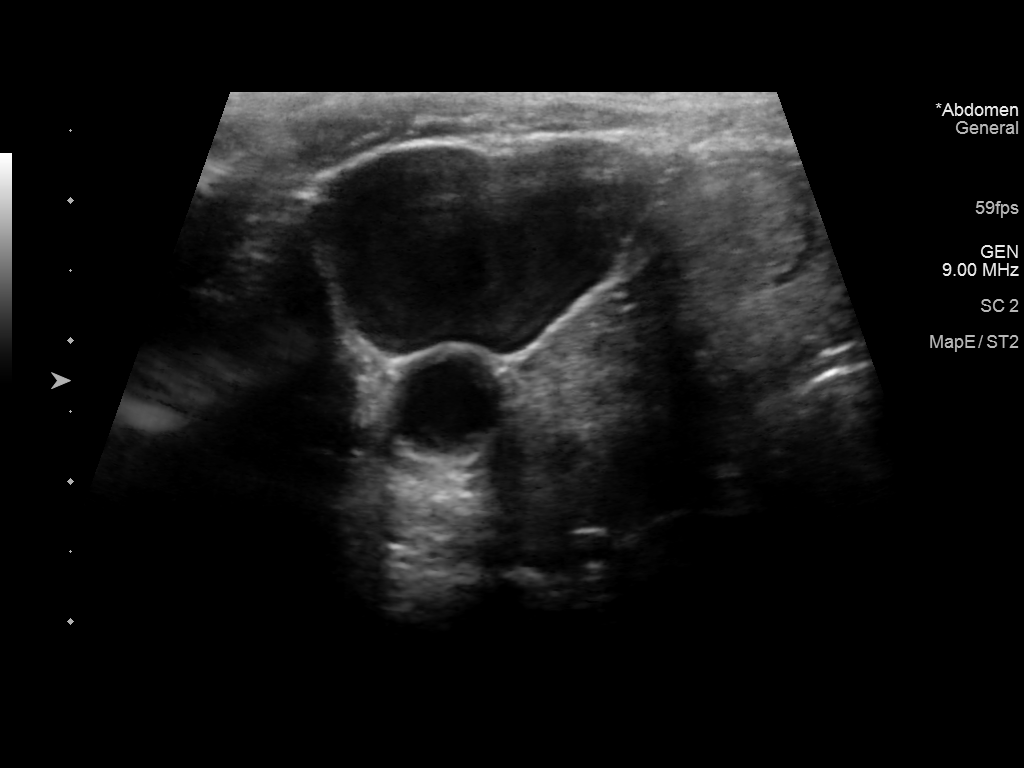

[1 of 1 positions shown; findings below may reference images not displayed]

EXAM:
TUNNELED PORT CATHETER PLACEMENT WITH ULTRASOUND AND FLUOROSCOPIC
GUIDANCE

FLUOROSCOPY TIME:  0.2 minutes; 42  uGym4 DAP

ANESTHESIA/SEDATION:
Intravenous Fentanyl 499mcg and Versed 2mg were administered as
conscious sedation during continuous monitoring of the patient's
level of consciousness and physiological / cardiorespiratory status
by the radiology RN, with a total moderate sedation time of 14
minutes.
Patency of the right IJ vein was confirmed with ultrasound with
image documentation. An appropriate skin site was determined. Skin
site was marked. Region was prepped using maximum barrier technique
including cap and mask, sterile gown, sterile gloves, large sterile
sheet, and Chlorhexidine as cutaneous antisepsis. The region was
infiltrated locally with 1% lidocaine. Under real-time ultrasound
guidance, the right IJ vein was accessed with a 21 gauge
micropuncture needle; the needle tip within the vein was confirmed
with ultrasound image documentation. Needle was exchanged over a 018
guidewire for transitional dilator, and vascular measurement was
performed.

A small incision was made on the right anterior chest wall and a
subcutaneous pocket fashioned. The power-injectable port was
positioned and its catheter tunneled to the right IJ dermatotomy
site. The transitional dilator was exchanged over an Amplatz wire
for a peel-away sheath, through which the port catheter, which had
been trimmed to the appropriate length, was advanced and positioned
under fluoroscopy with its tip at the cavoatrial junction. Spot
chest radiograph confirms good catheter position and no
pneumothorax. The port was flushed per protocol. The pocket was
closed with deep interrupted and subcuticular continuous 3-0
Monocryl sutures. The incisions were covered with Dermabond then
covered with a sterile dressing.

The patient tolerated the procedure well.

COMPLICATIONS:
COMPLICATIONS
None immediate
IMPRESSION: Technically successful right IJ power-injectable port catheter
placement. Ready for routine use.

## 2023-05-30 ENCOUNTER — Inpatient Hospital Stay: Payer: Medicare Other

## 2023-05-30 ENCOUNTER — Encounter: Payer: Self-pay | Admitting: Internal Medicine

## 2023-05-30 ENCOUNTER — Inpatient Hospital Stay (HOSPITAL_BASED_OUTPATIENT_CLINIC_OR_DEPARTMENT_OTHER): Payer: Medicare Other | Admitting: Internal Medicine

## 2023-05-30 DIAGNOSIS — C3411 Malignant neoplasm of upper lobe, right bronchus or lung: Secondary | ICD-10-CM | POA: Diagnosis not present

## 2023-05-30 DIAGNOSIS — Z5111 Encounter for antineoplastic chemotherapy: Secondary | ICD-10-CM | POA: Diagnosis not present

## 2023-05-30 LAB — CMP (CANCER CENTER ONLY)
ALT: 10 U/L (ref 0–44)
AST: 24 U/L (ref 15–41)
Albumin: 3.3 g/dL — ABNORMAL LOW (ref 3.5–5.0)
Alkaline Phosphatase: 168 U/L — ABNORMAL HIGH (ref 38–126)
Anion gap: 8 (ref 5–15)
BUN: 8 mg/dL (ref 8–23)
CO2: 24 mmol/L (ref 22–32)
Calcium: 8.9 mg/dL (ref 8.9–10.3)
Chloride: 101 mmol/L (ref 98–111)
Creatinine: 0.41 mg/dL — ABNORMAL LOW (ref 0.44–1.00)
GFR, Estimated: >60 mL/min (ref >60)
Glucose, Bld: 88 mg/dL (ref 70–99)
Potassium: 3.5 mmol/L (ref 3.5–5.1)
Sodium: 133 mmol/L — ABNORMAL LOW (ref 135–145)
Total Bilirubin: 0.3 mg/dL (ref 0.3–1.2)
Total Protein: 6.5 g/dL (ref 6.5–8.1)

## 2023-05-30 LAB — CBC WITH DIFFERENTIAL (CANCER CENTER ONLY)
Abs Immature Granulocytes: 0.05 10*3/uL (ref 0.00–0.07)
Basophils Absolute: 0 10*3/uL (ref 0.0–0.1)
Basophils Relative: 0 %
Eosinophils Absolute: 0 10*3/uL (ref 0.0–0.5)
Eosinophils Relative: 1 %
HCT: 30.5 % — ABNORMAL LOW (ref 36.0–46.0)
Hemoglobin: 9.6 g/dL — ABNORMAL LOW (ref 12.0–15.0)
Immature Granulocytes: 1 %
Lymphocytes Relative: 24 %
Lymphs Abs: 1.7 10*3/uL (ref 0.7–4.0)
MCH: 29.2 pg (ref 26.0–34.0)
MCHC: 31.5 g/dL (ref 30.0–36.0)
MCV: 92.7 fL (ref 80.0–100.0)
Monocytes Absolute: 1.2 10*3/uL — ABNORMAL HIGH (ref 0.1–1.0)
Monocytes Relative: 17 %
Neutro Abs: 4.2 10*3/uL (ref 1.7–7.7)
Neutrophils Relative %: 57 %
Platelet Count: 309 10*3/uL (ref 150–400)
RBC: 3.29 MIL/uL — ABNORMAL LOW (ref 3.87–5.11)
RDW: 20.4 % — ABNORMAL HIGH (ref 11.5–15.5)
WBC Count: 7.2 10*3/uL (ref 4.0–10.5)
nRBC: 0 % (ref 0.0–0.2)

## 2023-05-30 LAB — LACTATE DEHYDROGENASE: LDH: 393 U/L — ABNORMAL HIGH (ref 98–192)

## 2023-05-30 MED ORDER — HEPARIN SOD (PORK) LOCK FLUSH 100 UNIT/ML IV SOLN
500.0000 [IU] | Freq: Once | INTRAVENOUS | Status: AC
  Administered 2023-05-30: 500 [IU] via INTRAVENOUS
  Filled 2023-05-30: qty 5

## 2023-05-30 NOTE — Progress Notes (Unsigned)
C/o feet swelling.  CAP scan done 05/23/23.  Does have some nausea.  Appetite is only 50% normal.  On O2, 2 liters at home.

## 2023-05-30 NOTE — Progress Notes (Unsigned)
[facility] CONSULT NOTE  Patient Care Team: Earna Coder, MD as PCP - General (Internal Medicine) Glory Buff, RN as Oncology Nurse Navigator  CHIEF COMPLAINTS/PURPOSE OF CONSULTATION: lung cancer  #  Oncology History Overview Note  # NOV -[id number] [facility] CANCER SCREENING PROGRAM]-18 mm right upper lobe lung nodule; DEC 2021- s/p right upper lobectomy [Dr. Dorris Fetch; [place]]; STAGE: I [pT-18 mm; LN-12=0]; predominant large cell neuroendocrine; minor adenocarcinoma.  DeclineD adjuvant chemotherapy.  #Recurrent/stage IV cancer NOV 2022- PET scan-scapular lesion liver lesion gastrohepatic lymphadenopathy.  11/21- start RT to right scapular lesion  # DEC 3rd, 2022- CARBO=ETOP+TECEN; udenyca #1; SEP 2023- STOP Tencetriq- progression  SEP 2023-liver biopsy shows neuroendocrine carcinoma/large cell; no adenocarcinoma noted.  Discontinue Tecentriq any progression of disease.  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   # OCTOBER, 2023-s /p carboplatin etoposide x4 cycles- without immunotherapy [progressed while on Atezo].  CT scan FEB 1st, 2024- Two new solid pulmonary nodules, largest 0.8 cm in the posterior left upper lobe, suspicious for new pulmonary metastases. Two enlarging left liver metastases, including substantial growth of the dominant bulky 9.7 cm segment 3 left liver metastasis. Stable expansile mixed lytic and sclerotic right scapular body metastasis.  # FEB 2024- Lubrinectidin cycle #3-progressive disease-noted; discontinue Lubrinectidin.   # MAY 3rd, 2024- IPI-1+NIVO-3 [KEPASAKE]- cycle #1- CT JUNE 27th-  CT CAP-  Multiple new and enlarged bilateral pulmonary nodules, consistent with worsened pulmonary metastatic disease; Continued interval enlargement of very bulky metastases occupying the left lobe of the liver. Unchanged mixed lytic and sclerotic osseous metastatic lesion of the right scapular body.  DISCONTINUE IPI+ NIVO  given the progressive disease.  # JULY 8th, 2024- Gemcitabine  # # MAY 2023- Right swollen eye/orbital inflammation/uveitis s/p Zometa status post steroids improved.-reviewed literature case reports noted; less concerning for tumor involvement.  DISCONTINUE ZOMETA.  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  Visceral Pleura Invasion: Not identified  Direct Invasion of Adjacent Structures: No adjacent structures present  Lymphovascular Invasion: Not identified  Margins: All margins negative for invasive carcinoma       Closest Margin(s) to Invasive Carcinoma: Bronchovascular margin  Treatment Effect: No known presurgical therapy  Regional Lymph Nodes:       Number of Lymph Nodes Involved: 0                            Nodal Sites with Tumor: Not applicable       Number of Lymph Nodes Examined: 12      Primary cancer of right upper lobe of lung (HCC)  11/03/2020 Initial Diagnosis   Primary cancer of right upper lobe of lung (HCC)   11/03/2020 Cancer Staging   Staging form: Lung, AJCC 8th Edition - Pathologic: Stage IA3 (pT1c, pN0, cM0) - Signed by Earna Coder, MD on 11/04/2020   09/14/2021 - 04/30/2022 Chemotherapy   Patient is on Treatment Plan : LUNG SCLC Carboplatin + Etoposide + Atezolizumab Induction q21d / Atezolizumab Maintenance q21d     09/15/2021 - 06/18/2022 Chemotherapy   Patient is on Treatment Plan : LUNG SCLC  Carboplatin + Etoposide + Atezolizumab Induction q21d x 4 cycles / Atezolizumab Maintenance q21d     11/09/2021 Cancer Staging   Staging form: Lung, AJCC 8th Edition - Pathologic: Stage IVB (pTX, pNX, cM1c) - Signed by  Earna Coder, MD on 11/09/2021   06/22/2022 Pathology Results   Liver biopsy showed metastatic cancer with neuroendocrine features    08/09/2022 - 10/22/2022 Chemotherapy   Patient is on Treatment Plan : BRAIN Carboplatin (AUC 5) D1 + Etoposide (120) D1-3 q28d     11/26/2022 - 01/10/2023 Chemotherapy   Patient is on Treatment Plan : LUNG SMALL CELL Lurbinectedin q21d     02/11/2023 - 03/25/2023 Chemotherapy   Patient is on Treatment Plan : MELANOMA Nivolumab (3) + Ipilimumab (1) q21d / Nivolumab (240) q14d     04/18/2023 -  Chemotherapy   Patient is on Treatment Plan : LUNG Gemcitabine (1000) D1,8 q21d      HISTORY OF PRESENTING ILLNESS: Ambulating independently.  Alone.   Stacie Kennedy 62 y.o.  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  c/o feet swelling. Does have some nausea.   Appetite is only 50% normal.   On O2, 2 liters at hom  Oxygen ha been delivered to her home. Fatigued. Productive cough, clear to yellow. Denies pain. No appetite. Eating small bland meals. Encourage more liquid .   Patient says that her pain is stable on OxyContin with as needed Percocet.  On average, she is taking 2 Percocets q 3-4 hours daily.   Complains of a lump around the right temple.  Also complains of dizziness. Chronic mild to moderate constipation. NO skin rash.   Review of Systems  Constitutional:  Positive for malaise/fatigue and weight loss. Negative for chills, diaphoresis and fever.  HENT:  Negative for nosebleeds and sore throat.   Eyes:  Negative for double vision.  Respiratory:  Negative for cough, hemoptysis, sputum production, shortness of breath and wheezing.   Cardiovascular:  Negative for chest pain, palpitations, orthopnea and leg swelling.  Gastrointestinal:  Positive for nausea and vomiting. Negative for abdominal pain,  blood in stool, constipation, diarrhea, heartburn and melena.  Genitourinary:  Negative for dysuria, frequency and urgency.  Musculoskeletal:  Positive for back pain, joint pain and myalgias.  Skin: Negative.  Negative for itching and rash.  Neurological:  Negative for dizziness, tingling, focal weakness, weakness and headaches.  Endo/Heme/Allergies:  Does not bruise/bleed easily.  Psychiatric/Behavioral:  Negative for depression. The patient is not nervous/anxious and does not have insomnia.      MEDICAL HISTORY:  Past Medical History:  Diagnosis Date  . Cancer (HCC)    lung cancer  . Cancer associated pain   . Depression   . Duodenitis   . Dyspnea   . Family history of cancer   . High cholesterol   . Personal history of colonic polyps   . Pre-diabetes   . Umbilical hernia   . UTI (urinary tract infection)     SURGICAL HISTORY: Past Surgical History:  Procedure Laterality Date  . COLONOSCOPY WITH PROPOFOL N/A 03/24/2021   Procedure: COLONOSCOPY WITH PROPOFOL;  Surgeon: Wyline Mood, MD;  Location[facility] ENDOSCOPY;  Service: Gastroenterology;  Laterality: N/A;  . INTERCOSTAL NERVE BLOCK  09/19/2020   Procedure: INTERCOSTAL NERVE BLOCK;  Surgeon: Loreli Slot, MD;  Location: [facility] OR;  Service: Thoracic;;  . IR IMAGING GUIDED  PORT INSERTION  09/23/2021  . LUNG LOBECTOMY Right   . LUNG REMOVAL, PARTIAL Right 09/19/2020  . NODE DISSECTION Right 09/19/2020   Procedure: NODE DISSECTION;  Surgeon: Loreli Slot, MD;  Location: [facility] OR;  Service: Thoracic;  Laterality: Right;  Marland Kitchen VIDEO BRONCHOSCOPY N/A 07/08/2021   Procedure: VIDEO BRONCHOSCOPY;  Surgeon: Loreli Slot, MD;  Location: [facility] OR;  Service: Thoracic;  Laterality: N/A;    SOCIAL HISTORY: Social History   Socioeconomic History  . Marital status: Single    Spouse name: Not on file  . Number of children: Not on file  . Years of education: Not on file  . Highest education level: Not on file   Occupational History  . Not on file  Tobacco Use  . Smoking status: Some Days    Current packs/day: 0.25    Average packs/day: 0.3 packs/day for 43.0 years (10.8 ttl pk-yrs)    Types: Cigarettes  . Smokeless tobacco: Never  . Tobacco comments:    states she is slowing, trying to quit  Vaping Use  . Vaping status: Never Used  Substance and Sexual Activity  . Alcohol use: Yes    Alcohol/week: 2.0 standard drinks of alcohol    Types: 2 Cans of beer per week    Comment: occasional  . Drug use: No  . Sexual activity: Not on file  Other Topics Concern  . Not on file  Social History Narrative   Lives in [place]; smokes; now and then beer; with boy friend. 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    Social Determinants of Health   Financial Resource Strain: High Risk (04/22/2022)   Overall Financial Resource Strain (CARDIA)   . Difficulty of Paying Living Expenses: Hard  Food Insecurity: Food Insecurity Present (07/09/2021)   Hunger Vital Sign   . Worried About Programme researcher, broadcasting/film/video in the Last Year: Sometimes true   . Ran Out of Food in the Last Year: Sometimes true  Transportation Needs: No Transportation Needs (07/09/2021)   PRAPARE - Transportation   . Lack of Transportation (Medical): No   . Lack of Transportation (Non-Medical): No  Physical Activity: Insufficiently Active (04/16/2021)   Exercise Vital Sign   . Days of Exercise per Week: 7 days   . Minutes of Exercise per Session: 20 min  Stress: Stress Concern Present (04/22/2022)   [employer] of Occupational Health - Occupational Stress Questionnaire   . Feeling of Stress : Very much  Social Connections: Socially Isolated (04/22/2022)   Social Connection and Isolation Panel [NHANES]   . Frequency of Communication with Friends and Family: Once a week   . Frequency of Social Gatherings with Friends and Family: Once a week   . Attends Religious Services: Never   . Active Member of Clubs or Organizations: No    . Attends Banker Meetings: Never   . Marital Status: Living with partner  Intimate Partner Violence: At Risk (04/22/2022)   Humiliation, Afraid, Rape, and Kick questionnaire   . Fear of Current or Ex-Partner: No   . Emotionally Abused: Yes   . Physically Abused: No   . Sexually Abused: No    FAMILY HISTORY: Family History  Problem Relation Age of Onset  . Diabetes Mother   . Cancer Mother   . Diabetes Father   . Stroke Father   . Heart attack Father        40s  . Healthy Sister   . Cancer Brother   . Hepatitis  Brother   . Diabetes Brother   . Hypertension Brother   . Heart disease Brother     ALLERGIES:  has No Known Allergies.  MEDICATIONS:  Current Outpatient Medications  Medication Sig Dispense Refill  . albuterol (PROVENTIL HFA) 108 (90 Base) MCG/ACT inhaler Inhale 2 puffs into the lungs once every 6 (six) hours as needed for wheezing or shortness of breath. 18 g 2  . DULoxetine (CYMBALTA) 30 MG capsule Take 1 capsule (30 mg total) by mouth daily. For nausea, appetite, sleep 30 capsule 3  . lidocaine-prilocaine (EMLA) cream Apply one application topically the the affected area(s) daily as needed. 30 g 3  . loratadine (CLARITIN) 10 MG tablet Take 1 tablet (10 mg total) by mouth daily. 30 tablet 2  . metFORMIN (GLUCOPHAGE) 500 MG tablet Take 1 tablet (500 mg total) by mouth daily with breakfast. 90 tablet 3  . naloxone (NARCAN) nasal spray 4 mg/0.1 mL SPRAY 1 SPRAY INTO ONE NOSTRIL AS DIRECTED FOR OPIOID OVERDOSE (TURN PERSON ON SIDE AFTER DOSE. IF NO RESPONSE IN 2-3 MINUTES OR PERSON RESPONDS BUT RELAPSES, REPEAT USING A NEW SPRAY DEVICE AND SPRAY INTO THE OTHER NOSTRIL. CALL 911 AFTER USE.) * EMERGENCY USE ONLY * 2 each 0  . OLANZapine (ZYPREXA) 5 MG tablet Take 1 tablet (5 mg total) by mouth at bedtime. 30 tablet 3  . ondansetron (ZOFRAN) 4 MG tablet TAKE 2 TABLETS (8MG ) BY MOUTH ONCE EVERY 8 HOURS AS NEEDED FOR NAUSEA OR VOMITING. 80 tablet 1  . oxyCODONE  (OXYCONTIN) 40 mg 12 hr tablet Take 1 tablet (40 mg total) by mouth every 12 (twelve) hours. 60 tablet 0  . oxyCODONE (ROXICODONE) 15 MG immediate release tablet Take 1-2 tablets (15-30 mg total) by mouth every 4 (four) hours as needed for pain. 60 tablet 0  . prochlorperazine (COMPAZINE) 10 MG tablet Take 1 tablet (10 mg total) by mouth once every 6 (six) hours as needed for nausea or vomiting. 40 tablet 1  . potassium chloride SA (KLOR-CON M) 20 MEQ tablet Take 1 tablet (20 mEq total) by mouth 2 (two) times daily. (Patient not taking: Reported on 05/16/2023) 60 tablet 3   No current facility-administered medications for this visit.   Facility-Administered Medications Ordered in Other Visits  Medication Dose Route Frequency Provider Last Rate Last Admin  . heparin lock flush 100 UNIT/ML injection           . heparin lock flush 100 unit/mL  500 Units Intravenous Once Louretta Shorten R, MD      . morphine (PF) 2 MG/ML injection           . sodium chloride flush (NS) 0.9 % injection 10 mL  10 mL Intravenous PRN Earna Coder, MD   10 mL at 03/01/22 0857      .  PHYSICAL EXAMINATION: ECOG PERFORMANCE STATUS: 0 - Asymptomatic  Vitals:   05/30/23 1038  BP: 101/68  Pulse: 93  Temp: 99.2 F (37.3 C)  SpO2: 96%           Filed Weights   05/30/23 1038  Weight: 127 lb 9.6 oz (57.9 kg)       Hepatomegaly noted.   Physical Exam HENT:     Head: Normocephalic and atraumatic.     Mouth/Throat:     Pharynx: No oropharyngeal exudate.  Eyes:     Pupils: Pupils are equal, round, and reactive to light.  Cardiovascular:     Rate and Rhythm: Normal rate and  regular rhythm.  Pulmonary:     Effort: Pulmonary effort is normal. No respiratory distress.     Breath sounds: Normal breath sounds. No wheezing.  Abdominal:     General: Bowel sounds are normal. There is no distension.     Palpations: Abdomen is soft. There is no mass.     Tenderness: There is no abdominal  tenderness. There is no guarding or rebound.  Musculoskeletal:        General: No tenderness. Normal range of motion.     Cervical back: Normal range of motion and neck supple.  Skin:    General: Skin is warm.  Neurological:     Mental Status: She is alert and oriented to person, place, and time.  Psychiatric:        Mood and Affect: Affect normal.     LABORATORY DATA:  I have reviewed the data as listed Lab Results  Component Value Date   WBC 7.2 05/30/2023   HGB 9.6 (L) 05/30/2023   HCT 30.5 (L) 05/30/2023   MCV 92.7 05/30/2023   PLT 309 05/30/2023   Recent Labs    05/09/23 1014 05/16/23 1023 05/30/23 1043  NA 136 135 133*  K 3.9 3.4* 3.5  CL 102 100 101  CO2 25 24 24   GLUCOSE 87 98 88  BUN 19 10 8   CREATININE 0.55 0.55 0.41*  CALCIUM 9.6 9.3 8.9  GFRNONAA >60 >60 >60  PROT 7.3 7.1 6.5  ALBUMIN 3.7 3.5 3.3*  AST 24 24 24   ALT 11 11 10   ALKPHOS 183* 193* 168*  BILITOT 0.3 0.2* 0.3    RADIOGRAPHIC STUDIES: I have personally reviewed the radiological images as listed and agreed with the findings in the report. CT CHEST ABDOMEN PELVIS W CONTRAST  Result Date: 05/23/2023 CLINICAL DATA:  Non-small-cell lung cancer. Evaluate treatment response. * Tracking Code: BO * EXAM: CT CHEST, ABDOMEN, AND PELVIS WITH CONTRAST TECHNIQUE: Multidetector CT imaging of the chest, abdomen and pelvis was performed following the standard protocol during bolus administration of intravenous contrast. RADIATION DOSE REDUCTION: This exam was performed according to the departmental dose-optimization program which includes automated exposure control, adjustment of the mA and/or kV according to patient size and/or use of iterative reconstruction technique. CONTRAST:  OMNIPAQUE IOHEXOL 300 MG/ML  SOLN COMPARISON:  04/07/2023 FINDINGS: CT CHEST FINDINGS Cardiovascular: Right Port-A-Cath tip high right atrium. Aortic atherosclerosis. Ulcerative plaque in the proximal descending thoracic aorta.  Normal heart size with minimal anterior pericardial fluid. Three vessel coronary artery calcification. No central pulmonary embolism, on this non-dedicated study. Mediastinum/Nodes: No supraclavicular adenopathy. No mediastinal or hilar adenopathy. Lungs/Pleura: No pleural fluid.  Moderate centrilobular emphysema. Right upper lobectomy. Subpleural predominant pulmonary nodules are again identified bilaterally. Index right lower lobe pulmonary nodule measures 2.3 x 1.7 cm on 95/4 versus 1.7 x 1.1 cm on the prior. Left lower lobe 2.7 x 1.8 cm nodule on 137/4 measured 2.3 x 1.5 cm on the prior. An anterior right upper lobe index nodule measures 1.1 x 1.1 cm on 60/4 versus 1.0 x 0.9 cm on the prior. Musculoskeletal: Right scapular body lytic lesion with surrounding soft tissue component measures 3.9 x 3.3 cm in 15/2 versus 4.1 x 3.5 cm on the prior exam (when remeasured). CT ABDOMEN PELVIS FINDINGS Hepatobiliary: Mass or confluence of multiple masses replacing the majority of the left hepatic lobe. Measures on the order of 17.9 x 12.2 cm on 69/2 versus 15.1 x 12.1 cm on the prior. Separate segment 2  mass measures 7.3 x 6.0 cm on 58/2 versus 6.4 x 5.3 cm on the prior exam (when remeasured). Normal gallbladder, without biliary ductal dilatation. Pancreas: Normal, without mass or ductal dilatation. Spleen: Normal in size, without focal abnormality. Adrenals/Urinary Tract: Normal right adrenal gland and left kidney. Right renal low-density lesions are too small to characterize but likely cysts . In the absence of clinically indicated signs/symptoms require(s) no independent follow-up. 3.1 x 2.3 cm left adrenal lesion is similar to on the prior exam and was low-density on 06/15/2022 PET, most consistent with an adenoma. Normal urinary bladder. Stomach/Bowel: Normal stomach, without wall thickening. Colonic stool burden suggests constipation. Normal terminal ileum and appendix. Normal small bowel. Vascular/Lymphatic: Advanced  aortic and branch vessel atherosclerosis. No abdominopelvic adenopathy. Reproductive: Multiple uterine fibroids including at up to 2.3 cm. No adnexal mass. Other: No significant free fluid. No evidence of omental or peritoneal disease. Musculoskeletal: No acute osseous abnormality.  Disc bulge at L4-5. IMPRESSION: CT CHEST IMPRESSION 1. Mildly progressive pulmonary metastasis. 2. Right scapular osseous metastasis is slightly smaller today. No new osseous metastasis identified. 3. No thoracic adenopathy. 4. Aortic atherosclerosis (ICD10-I70.0), coronary artery atherosclerosis and emphysema (ICD10-J43.9). CT ABDOMEN AND PELVIS IMPRESSION 1. Mildly progressive hepatic metastasis. 2. Left adrenal adenoma. 3. Fibroid uterus Electronically Signed   By: Jeronimo Greaves M.D.   On: 05/23/2023 15:11   MR BRAIN W WO CONTRAST  Result Date: 05/22/2023 CLINICAL DATA:  Non-small cell lung carcinoma.  Right temporal mass. EXAM: MRI HEAD WITHOUT AND WITH CONTRAST TECHNIQUE: Multiplanar, multiecho pulse sequences of the brain and surrounding structures were obtained without and with intravenous contrast. CONTRAST:  6mL GADAVIST GADOBUTROL 1 MMOL/ML IV SOLN COMPARISON:  Brain MRI 09/15/21 and Orbits MRI 03/08/22 FINDINGS: Brain: No acute infarct, mass effect or extra-axial collection. No acute or chronic hemorrhage. Normal white matter signal, parenchymal volume and CSF spaces. 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 The midline structures are normal. There is a trans calvarial, contrast-enhancing extra-axial mass of the right middle cranial fossa. The intracranial component measures 4.1 x 0.8 cm. The extracranial component measures 4.8 x 1.5 cm. No intraparenchymal contrast enhancement of the brain. Vascular: Major flow voids are preserved. Skull and upper cervical spine: Transcalvarial mass as above. There is also a small calvarial mass of the midline occipital calvarium. Neither of these lesions were visible on the prior studies.  Sinuses/Orbits:No paranasal sinus fluid levels or advanced mucosal thickening. No mastoid or middle ear effusion. Normal orbits. IMPRESSION: 1. Transcalvarial, contrast-enhancing extra-axial mass of the right middle cranial fossa, concerning for metastatic disease. 2. Second, smaller calvarial mass at the occipital bone. Both of these lesions are new. 3. No intraparenchymal metastatic disease. 4. 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 Electronically Signed   By: Deatra Robinson M.D.   On: 05/22/2023 01:51     ASSESSMENT & PLAN:   No problem-specific Assessment & Plan notes found for this encounter.     Earna Coder, MD 05/30/2023 11:31 AM

## 2023-05-30 NOTE — Assessment & Plan Note (Addendum)
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  Progression noted on currently s/p  gemcitabine chemotherapy- 2 cylecs- CT AUG 14th, 2024- Mildly progressive pulmonary metastasis;  Right scapular osseous metastasis is slightly smaller today. No new osseous metastasis identified.  Mildly progressive hepatic metastasis.  # Along with the patient regarding the progression of disease noted on imaging.  Currently patient s/p fifth line chemotherapy.  Discussed with patient that unfortunately further chemotherapy will not be helpful.  I am concerned the chemotherapy will cause more harm than benefit.  See discussion below  # # COPD: refilled albuterol;-  ambulatory pul Ox- 85; qualification of home O2- stable.  # Right temporal mass- ? Metastatic disease to bone- JULy 2024 MRI brain- c/w mets to scalp-.    # right foot drop sec to chemo-stable/improved- monitor for now. stable.  # Mild Hypokalemia-  stable.  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 10 fractions. OFF  fentanyl patch; continue  oxycontin 40 BID; and oxycodone as per palliative care;- poorly controlled- Discussed with palliative care re: alternative medications. Appt with josh in 1 week.  Also await x-ray of the right humerus.  If any metastatic disease noted recommend-palliative radiation.  # weight loss/ loss of apetite/ nausea- on zyprexa at bedtime-  # constipation:  Miralax BID; fluids; Dulcolax-stable.  # DM:  On Metformin-stable.  #IV access: port functional- s/p TPA- functional  # I had a long discussion with the patient  given the incurable nature of the disease /poor tolerance of therapy.  I also discussed given general less than 6 months of life expectancy, I introduced hospice philosophy to the patient in detail.   # I also  discussed that goal of care should be directed to symptom management rather than treating the underlying disease; and in the process help improve quality of life rather than quantity.  Discussed with hospice team would include-nurse, nurse aide, social worker and chaplain for help take care of patient with physical/emotional needs.  Unfortunately patient continues to decline hospice.   # DISPOSITION:  #NO Chemo; de-access  # Follow up in 4 weeks- MD; labs- cbc/cmp; port flush- [facility] Dr.B  # 40 minutes face-to-face with the patient discussing the above plan of care; more than 50% of time spent on prognosis/ natural history; counseling and coordination.

## 2023-05-31 ENCOUNTER — Encounter: Payer: Self-pay | Admitting: Internal Medicine

## 2023-05-31 ENCOUNTER — Telehealth: Payer: Self-pay | Admitting: Dietician

## 2023-05-31 NOTE — Telephone Encounter (Signed)
Attempt to reach patient in response to recent weight loss. Provided my cell# on voice mail to return call to set up a nutrition consult.  Gennaro Africa, RDN, LDN Registered Dietitian, [facility] Part Time Remote (Usual office hours: Tuesday-Thursday) Cell: [phone]

## 2023-06-01 ENCOUNTER — Other Ambulatory Visit: Payer: Self-pay

## 2023-06-01 ENCOUNTER — Other Ambulatory Visit: Payer: Self-pay | Admitting: *Deleted

## 2023-06-01 MED ORDER — OXYCODONE HCL 15 MG PO TABS
15.0000 mg | ORAL_TABLET | ORAL | 0 refills | Status: DC | PRN
  Filled 2023-06-01: qty 57, 4d supply, fill #0
  Filled 2023-06-01: qty 3, 1d supply, fill #0

## 2023-06-01 NOTE — Telephone Encounter (Signed)
Pt requesting refill of oxycodone

## 2023-06-02 ENCOUNTER — Ambulatory Visit
Admission: RE | Admit: 2023-06-02 | Discharge: 2023-06-02 | Disposition: A | Discharge status: Home / Self Care | Payer: Medicare Other | Source: Ambulatory Visit | Attending: Hospice and Palliative Medicine | Admitting: Hospice and Palliative Medicine

## 2023-06-02 ENCOUNTER — Ambulatory Visit
Admission: RE | Admit: 2023-06-02 | Discharge: 2023-06-02 | Disposition: A | Discharge status: Home / Self Care | Payer: Medicare Other | Source: Home / Self Care | Attending: Hospice and Palliative Medicine | Admitting: Hospice and Palliative Medicine

## 2023-06-02 DIAGNOSIS — G893 Neoplasm related pain (acute) (chronic): Secondary | ICD-10-CM

## 2023-06-02 DIAGNOSIS — C3411 Malignant neoplasm of upper lobe, right bronchus or lung: Secondary | ICD-10-CM | POA: Diagnosis present

## 2023-06-06 ENCOUNTER — Inpatient Hospital Stay (HOSPITAL_BASED_OUTPATIENT_CLINIC_OR_DEPARTMENT_OTHER): Payer: Medicare Other | Admitting: Hospice and Palliative Medicine

## 2023-06-06 DIAGNOSIS — C3411 Malignant neoplasm of upper lobe, right bronchus or lung: Secondary | ICD-10-CM

## 2023-06-06 NOTE — Progress Notes (Signed)
Unable to reach patient by phone.  Will reschedule.

## 2023-06-07 ENCOUNTER — Other Ambulatory Visit: Payer: Self-pay

## 2023-06-08 ENCOUNTER — Other Ambulatory Visit: Payer: Self-pay

## 2023-06-08 ENCOUNTER — Other Ambulatory Visit: Payer: Self-pay | Admitting: *Deleted

## 2023-06-08 ENCOUNTER — Telehealth: Payer: Medicare Other | Admitting: Hospice and Palliative Medicine

## 2023-06-08 DIAGNOSIS — C3411 Malignant neoplasm of upper lobe, right bronchus or lung: Secondary | ICD-10-CM

## 2023-06-08 MED ORDER — OXYCODONE-ACETAMINOPHEN 10-325 MG PO TABS
1.0000 | ORAL_TABLET | ORAL | 0 refills | Status: DC | PRN
  Filled 2023-06-08: qty 90, 7d supply, fill #0

## 2023-06-08 NOTE — Telephone Encounter (Signed)
Pt called in to request to change her short acting pain medication back to percocet since that helped manage her pain better. Prescription for percocet pended for approval. Previous prescription for oxycodone immediate release removed from current med list.

## 2023-06-09 ENCOUNTER — Telehealth: Payer: Self-pay | Admitting: Dietician

## 2023-06-09 NOTE — Telephone Encounter (Signed)
Second attempt to reach patient in response to recent weight loss. Provided my cell# on voice mail to return call to set up a nutrition consult.  Gennaro Africa, RDN, LDN Registered Dietitian, [facility] Part Time Remote (Usual office hours: Tuesday-Thursday) Cell: [phone]

## 2023-06-10 ENCOUNTER — Inpatient Hospital Stay: Payer: Medicare Other | Admitting: Hospice and Palliative Medicine

## 2023-06-14 ENCOUNTER — Encounter: Payer: Self-pay | Admitting: Hospice and Palliative Medicine

## 2023-06-14 ENCOUNTER — Other Ambulatory Visit: Payer: Self-pay

## 2023-06-14 ENCOUNTER — Inpatient Hospital Stay: Payer: Medicare Other | Attending: Radiation Oncology | Admitting: Hospice and Palliative Medicine

## 2023-06-14 ENCOUNTER — Other Ambulatory Visit: Payer: Self-pay | Admitting: *Deleted

## 2023-06-14 VITALS — BP 112/66 | HR 82 | Temp 98.1°F | Resp 20 | Ht 67.0 in | Wt 128.6 lb

## 2023-06-14 DIAGNOSIS — F17210 Nicotine dependence, cigarettes, uncomplicated: Secondary | ICD-10-CM | POA: Diagnosis not present

## 2023-06-14 DIAGNOSIS — D3502 Benign neoplasm of left adrenal gland: Secondary | ICD-10-CM | POA: Diagnosis not present

## 2023-06-14 DIAGNOSIS — K5900 Constipation, unspecified: Secondary | ICD-10-CM | POA: Diagnosis not present

## 2023-06-14 DIAGNOSIS — E119 Type 2 diabetes mellitus without complications: Secondary | ICD-10-CM | POA: Diagnosis not present

## 2023-06-14 DIAGNOSIS — E7800 Pure hypercholesterolemia, unspecified: Secondary | ICD-10-CM | POA: Diagnosis not present

## 2023-06-14 DIAGNOSIS — C7951 Secondary malignant neoplasm of bone: Secondary | ICD-10-CM | POA: Diagnosis present

## 2023-06-14 DIAGNOSIS — I2510 Atherosclerotic heart disease of native coronary artery without angina pectoris: Secondary | ICD-10-CM | POA: Diagnosis not present

## 2023-06-14 DIAGNOSIS — Z87440 Personal history of urinary (tract) infections: Secondary | ICD-10-CM | POA: Diagnosis not present

## 2023-06-14 DIAGNOSIS — M21371 Foot drop, right foot: Secondary | ICD-10-CM | POA: Diagnosis not present

## 2023-06-14 DIAGNOSIS — D259 Leiomyoma of uterus, unspecified: Secondary | ICD-10-CM | POA: Diagnosis not present

## 2023-06-14 DIAGNOSIS — Z9221 Personal history of antineoplastic chemotherapy: Secondary | ICD-10-CM | POA: Insufficient documentation

## 2023-06-14 DIAGNOSIS — K7689 Other specified diseases of liver: Secondary | ICD-10-CM | POA: Diagnosis not present

## 2023-06-14 DIAGNOSIS — Z7984 Long term (current) use of oral hypoglycemic drugs: Secondary | ICD-10-CM | POA: Diagnosis not present

## 2023-06-14 DIAGNOSIS — Z79899 Other long term (current) drug therapy: Secondary | ICD-10-CM | POA: Insufficient documentation

## 2023-06-14 DIAGNOSIS — J432 Centrilobular emphysema: Secondary | ICD-10-CM | POA: Insufficient documentation

## 2023-06-14 DIAGNOSIS — E876 Hypokalemia: Secondary | ICD-10-CM | POA: Diagnosis not present

## 2023-06-14 DIAGNOSIS — I700 Atherosclerosis of aorta: Secondary | ICD-10-CM | POA: Insufficient documentation

## 2023-06-14 DIAGNOSIS — M79601 Pain in right arm: Secondary | ICD-10-CM

## 2023-06-14 DIAGNOSIS — Z86010 Personal history of colonic polyps: Secondary | ICD-10-CM | POA: Insufficient documentation

## 2023-06-14 DIAGNOSIS — K429 Umbilical hernia without obstruction or gangrene: Secondary | ICD-10-CM | POA: Diagnosis not present

## 2023-06-14 DIAGNOSIS — C3411 Malignant neoplasm of upper lobe, right bronchus or lung: Secondary | ICD-10-CM | POA: Diagnosis not present

## 2023-06-14 DIAGNOSIS — G893 Neoplasm related pain (acute) (chronic): Secondary | ICD-10-CM | POA: Insufficient documentation

## 2023-06-14 MED ORDER — OXYCONTIN 60 MG PO T12A
1.0000 | EXTENDED_RELEASE_TABLET | Freq: Two times a day (BID) | ORAL | 0 refills | Status: DC
  Filled 2023-06-14: qty 60, 30d supply, fill #0

## 2023-06-14 NOTE — Addendum Note (Signed)
Addended by: Laurette Schimke R on: 06/14/2023 02:00 PM   Modules accepted: Orders

## 2023-06-14 NOTE — Progress Notes (Addendum)
Palliative Medicine [facility]  [phone] [phone]   Name: Stacie Kennedy Date: 06/14/2023 MRN: [id number]  DOB: 20-Jul-1961  Patient Care Team: Earna Coder, MD as PCP - General (Internal Medicine) Glory Buff, RN as Oncology Nurse Navigator    REASON FOR CONSULTATION: Stacie Kennedy is a 62 y.o. 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  Patient has had severe pain with scapular metastases and was started on XRT.  She is referred to palliative care to help address goals and manage ongoing symptoms.   SOCIAL HISTORY:     reports that she has been smoking cigarettes. She has a 10.8 pack-year smoking history. She has never used smokeless tobacco. She reports current alcohol use of about 2.0 standard drinks of alcohol per week. She reports that she does not use drugs.  Patient lives at home with her boyfriend.  ADVANCE DIRECTIVES:  Does not have  CODE STATUS:   PAST MEDICAL HISTORY: Past Medical History:  Diagnosis Date   Cancer (HCC)    lung cancer   Cancer associated pain    Depression    Duodenitis    Dyspnea    Family history of cancer    High cholesterol    Personal history of colonic polyps    Pre-diabetes    Umbilical hernia    UTI (urinary tract infection)     PAST SURGICAL HISTORY:  Past Surgical History:  Procedure Laterality Date   COLONOSCOPY WITH PROPOFOL N/A 03/24/2021   Procedure: COLONOSCOPY WITH PROPOFOL;  Surgeon: Wyline Mood, MD;  Location: [facility] ENDOSCOPY;  Service: Gastroenterology;  Laterality: N/A;   INTERCOSTAL NERVE BLOCK  09/19/2020   Procedure: INTERCOSTAL NERVE BLOCK;  Surgeon: Loreli Slot, MD;  Location: [facility] OR;  Service: Thoracic;;   IR IMAGING GUIDED PORT INSERTION   09/23/2021   LUNG LOBECTOMY Right    LUNG REMOVAL, PARTIAL Right 09/19/2020   NODE DISSECTION Right 09/19/2020   Procedure: NODE DISSECTION;  Surgeon: Loreli Slot, MD;  Location: [facility] OR;  Service: Thoracic;  Laterality: Right;   VIDEO BRONCHOSCOPY N/A 07/08/2021   Procedure: VIDEO BRONCHOSCOPY;  Surgeon: Loreli Slot, MD;  Location: [facility] OR;  Service: Thoracic;  Laterality: N/A;    HEMATOLOGY/ONCOLOGY HISTORY:  Oncology History Overview Note  # NOV -[id number] [LUNG CANCER SCREENING PROGRAM]-18 mm right upper lobe lung nodule; DEC 2021- s/p right upper lobectomy [Dr. Dorris Fetch; [place]]; STAGE: I [pT-18 mm; LN-12=0]; predominant large cell neuroendocrine; minor adenocarcinoma.  DeclineD adjuvant chemotherapy.  #Recurrent/stage IV cancer NOV 2022- PET scan-scapular lesion liver lesion gastrohepatic lymphadenopathy.  11/21- start RT to right scapular lesion  # DEC 3rd, 2022- CARBO=ETOP+TECEN; udenyca #1; SEP 2023- STOP Tencetriq- progression  SEP 2023-liver biopsy shows neuroendocrine carcinoma/large cell; no adenocarcinoma noted.  Discontinue Tecentriq any progression of disease.  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   # OCTOBER, 2023-s /p carboplatin etoposide x4 cycles- without immunotherapy [progressed while on Atezo].  CT scan FEB 1st, 2024- Two new solid pulmonary nodules, largest 0.8 cm in the posterior left upper lobe, suspicious for new pulmonary metastases. Two enlarging left liver metastases, including substantial growth of the dominant bulky 9.7 cm segment 3 left liver metastasis. Stable expansile mixed lytic and sclerotic right scapular body metastasis.  # FEB 2024- Lubrinectidin cycle #3-progressive disease-noted; discontinue Lubrinectidin.   #  MAY 3rd, 2024- IPI-1+NIVO-3 [KEPASAKE]- cycle #1- CT JUNE 27th-  CT CAP-  Multiple new and enlarged bilateral pulmonary nodules, consistent with worsened pulmonary metastatic disease;  Continued interval enlargement of very bulky metastases occupying the left lobe of the liver. Unchanged mixed lytic and sclerotic osseous metastatic lesion of the right scapular body.  DISCONTINUE IPI+ NIVO given the progressive disease.  # JULY 8th, 2024- Gemcitabine x2 ccyles- AUG 14th CT CAP- PROGRESSIVE.   # # MAY 2023- Right swollen eye/orbital inflammation/uveitis s/p Zometa status post steroids improved.-reviewed literature case reports noted; less concerning for tumor involvement.  DISCONTINUE ZOMETA.  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  Visceral Pleura Invasion: Not identified  Direct Invasion of Adjacent Structures: No adjacent structures present  Lymphovascular Invasion: Not identified  Margins: All margins negative for invasive carcinoma       Closest Margin(s) to Invasive Carcinoma: Bronchovascular margin  Treatment Effect: No known presurgical therapy  Regional Lymph Nodes:       Number of Lymph Nodes Involved: 0                            Nodal Sites with Tumor: Not applicable       Number of Lymph Nodes Examined: 12      Primary cancer of right upper lobe of lung (HCC)  11/03/2020 Initial Diagnosis   Primary cancer of right upper lobe of lung (HCC)   11/03/2020 Cancer Staging   Staging form: Lung, AJCC 8th Edition - Pathologic: Stage IA3 (pT1c, pN0, cM0) - Signed by Earna Coder, MD on 11/04/2020   09/14/2021 - 04/30/2022 Chemotherapy   Patient is on Treatment Plan : LUNG SCLC Carboplatin + Etoposide + Atezolizumab Induction q21d / Atezolizumab Maintenance q21d     09/15/2021 - 06/18/2022 Chemotherapy   Patient is on  Treatment Plan : LUNG SCLC Carboplatin + Etoposide + Atezolizumab Induction q21d x 4 cycles / Atezolizumab Maintenance q21d     11/09/2021 Cancer Staging   Staging form: Lung, AJCC 8th Edition - Pathologic: Stage IVB (pTX, pNX, cM1c) - Signed by Earna Coder, MD on 11/09/2021   06/22/2022 Pathology Results   Liver biopsy showed metastatic cancer with neuroendocrine features    08/09/2022 - 10/22/2022 Chemotherapy   Patient is on Treatment Plan : BRAIN Carboplatin (AUC 5) D1 + Etoposide (120) D1-3 q28d     11/26/2022 - 01/10/2023 Chemotherapy   Patient is on Treatment Plan : LUNG SMALL CELL Lurbinectedin q21d     02/11/2023 - 03/25/2023 Chemotherapy   Patient is on Treatment Plan : MELANOMA Nivolumab (3) + Ipilimumab (1) q21d / Nivolumab (240) q14d     04/18/2023 - 05/16/2023 Chemotherapy   Patient is on Treatment Plan : LUNG Gemcitabine (1000) D1,8 q21d       ALLERGIES:  has No Known Allergies.  MEDICATIONS:  Current Outpatient Medications  Medication Sig Dispense Refill   albuterol (PROVENTIL HFA) 108 (90 Base) MCG/ACT inhaler Inhale 2 puffs into the lungs once every 6 (six) hours as needed for wheezing or shortness of breath. 18 g 2   DULoxetine (CYMBALTA) 30 MG  capsule Take 1 capsule (30 mg total) by mouth daily. For nausea, appetite, sleep 30 capsule 3   lidocaine-prilocaine (EMLA) cream Apply one application topically the the affected area(s) daily as needed. 30 g 3   loratadine (CLARITIN) 10 MG tablet Take 1 tablet (10 mg total) by mouth daily. 30 tablet 2   metFORMIN (GLUCOPHAGE) 500 MG tablet Take 1 tablet (500 mg total) by mouth daily with breakfast. 90 tablet 3   OLANZapine (ZYPREXA) 5 MG tablet Take 1 tablet (5 mg total) by mouth at bedtime. 30 tablet 3   ondansetron (ZOFRAN) 4 MG tablet TAKE 2 TABLETS (8MG ) BY MOUTH ONCE EVERY 8 HOURS AS NEEDED FOR NAUSEA OR VOMITING. 80 tablet 1   oxyCODONE (OXYCONTIN) 40 mg 12 hr tablet Take 1 tablet (40 mg total) by mouth every 12  (twelve) hours. 60 tablet 0   oxyCODONE-acetaminophen (PERCOCET) 10-325 MG tablet Take 1-2 tablets by mouth every 4 (four) hours as needed for pain. 90 tablet 0   prochlorperazine (COMPAZINE) 10 MG tablet Take 1 tablet (10 mg total) by mouth once every 6 (six) hours as needed for nausea or vomiting. 40 tablet 1   psyllium (METAMUCIL) 58.6 % packet Take 1 packet by mouth daily.     naloxone (NARCAN) nasal spray 4 mg/0.1 mL SPRAY 1 SPRAY INTO ONE NOSTRIL AS DIRECTED FOR OPIOID OVERDOSE (TURN PERSON ON SIDE AFTER DOSE. IF NO RESPONSE IN 2-3 MINUTES OR PERSON RESPONDS BUT RELAPSES, REPEAT USING A NEW SPRAY DEVICE AND SPRAY INTO THE OTHER NOSTRIL. CALL 911 AFTER USE.) * EMERGENCY USE ONLY * (Patient not taking: Reported on 06/14/2023) 2 each 0   No current facility-administered medications for this visit.   Facility-Administered Medications Ordered in Other Visits  Medication Dose Route Frequency Provider Last Rate Last Admin   heparin lock flush 100 UNIT/ML injection            heparin lock flush 100 unit/mL  500 Units Intravenous Once Louretta Shorten R, MD       morphine (PF) 2 MG/ML injection            sodium chloride flush (NS) 0.9 % injection 10 mL  10 mL Intravenous PRN Earna Coder, MD   10 mL at 03/01/22 0857    VITAL SIGNS: BP 112/66   Pulse 82   Temp 98.1 F (36.7 C) (Tympanic)   Resp 20   Ht 5\' 7"  (1.702 m)   Wt 128 lb 9.6 oz (58.3 kg)   SpO2 96% Comment: room air  BMI 20.14 kg/m  Filed Weights   06/14/23 0841  Weight: 128 lb 9.6 oz (58.3 kg)    Estimated body mass index is 20.14 kg/m as calculated from the following:   Height as of this encounter: 5\' 7"  (1.702 m).   Weight as of this encounter: 128 lb 9.6 oz (58.3 kg).  LABS: CBC:    Component Value Date/Time   WBC 7.2 05/30/2023 1043   WBC 3.9 (L) 01/31/2023 0857   HGB 9.6 (L) 05/30/2023 1043   HGB 15.5 11/26/2020 1214   HCT 30.5 (L) 05/30/2023 1043   HCT 47.6 (H) 11/26/2020 1214   PLT 309 05/30/2023  1043   PLT 317 11/26/2020 1214   MCV 92.7 05/30/2023 1043   MCV 87 11/26/2020 1214   NEUTROABS 4.2 05/30/2023 1043   NEUTROABS 2.5 11/26/2020 1214   LYMPHSABS 1.7 05/30/2023 1043   LYMPHSABS 3.6 (H) 11/26/2020 1214   MONOABS 1.2 (H) 05/30/2023 1043  EOSABS 0.0 05/30/2023 1043   EOSABS 0.2 11/26/2020 1214   BASOSABS 0.0 05/30/2023 1043   BASOSABS 0.1 11/26/2020 1214   Comprehensive Metabolic Panel:    Component Value Date/Time   NA 133 (L) 05/30/2023 1043   NA 139 11/26/2020 1214   K 3.5 05/30/2023 1043   CL 101 05/30/2023 1043   CO2 24 05/30/2023 1043   BUN 8 05/30/2023 1043   BUN 8 11/26/2020 1214   CREATININE 0.41 (L) 05/30/2023 1043   GLUCOSE 88 05/30/2023 1043   CALCIUM 8.9 05/30/2023 1043   AST 24 05/30/2023 1043   ALT 10 05/30/2023 1043   ALKPHOS 168 (H) 05/30/2023 1043   BILITOT 0.3 05/30/2023 1043   PROT 6.5 05/30/2023 1043   PROT 6.9 11/26/2020 1214   ALBUMIN 3.3 (L) 05/30/2023 1043   ALBUMIN 4.0 11/26/2020 1214    RADIOGRAPHIC STUDIES: DG Humerus Right  Result Date: 06/02/2023 CLINICAL DATA:  Right arm pain for several years. History of lung cancer. EXAM: RIGHT HUMERUS - 2+ VIEW COMPARISON:  July 30, 2022. FINDINGS: No fracture or other bony abnormality is seen involving the right humerus. Stable expansile sclerotic lesion is seen involving right scapula as noted on prior study consistent with metastatic lesion. IMPRESSION: Normal right humerus. Stable expansile sclerotic right scapular lesion is noted above. Electronically Signed   By: Lupita Raider M.D.   On: 06/02/2023 11:59   CT CHEST ABDOMEN PELVIS W CONTRAST  Result Date: 05/23/2023 CLINICAL DATA:  Non-small-cell lung cancer. Evaluate treatment response. * Tracking Code: BO * EXAM: CT CHEST, ABDOMEN, AND PELVIS WITH CONTRAST TECHNIQUE: Multidetector CT imaging of the chest, abdomen and pelvis was performed following the standard protocol during bolus administration of intravenous contrast. RADIATION  DOSE REDUCTION: This exam was performed according to the departmental dose-optimization program which includes automated exposure control, adjustment of the mA and/or kV according to patient size and/or use of iterative reconstruction technique. CONTRAST:  OMNIPAQUE IOHEXOL 300 MG/ML  SOLN COMPARISON:  04/07/2023 FINDINGS: CT CHEST FINDINGS Cardiovascular: Right Port-A-Cath tip high right atrium. Aortic atherosclerosis. Ulcerative plaque in the proximal descending thoracic aorta. Normal heart size with minimal anterior pericardial fluid. Three vessel coronary artery calcification. No central pulmonary embolism, on this non-dedicated study. Mediastinum/Nodes: No supraclavicular adenopathy. No mediastinal or hilar adenopathy. Lungs/Pleura: No pleural fluid.  Moderate centrilobular emphysema. Right upper lobectomy. Subpleural predominant pulmonary nodules are again identified bilaterally. Index right lower lobe pulmonary nodule measures 2.3 x 1.7 cm on 95/4 versus 1.7 x 1.1 cm on the prior. Left lower lobe 2.7 x 1.8 cm nodule on 137/4 measured 2.3 x 1.5 cm on the prior. An anterior right upper lobe index nodule measures 1.1 x 1.1 cm on 60/4 versus 1.0 x 0.9 cm on the prior. Musculoskeletal: Right scapular body lytic lesion with surrounding soft tissue component measures 3.9 x 3.3 cm in 15/2 versus 4.1 x 3.5 cm on the prior exam (when remeasured). CT ABDOMEN PELVIS FINDINGS Hepatobiliary: Mass or confluence of multiple masses replacing the majority of the left hepatic lobe. Measures on the order of 17.9 x 12.2 cm on 69/2 versus 15.1 x 12.1 cm on the prior. Separate segment 2 mass measures 7.3 x 6.0 cm on 58/2 versus 6.4 x 5.3 cm on the prior exam (when remeasured). Normal gallbladder, without biliary ductal dilatation. Pancreas: Normal, without mass or ductal dilatation. Spleen: Normal in size, without focal abnormality. Adrenals/Urinary Tract: Normal right adrenal gland and left kidney. Right renal low-density  lesions are too small  to characterize but likely cysts . In the absence of clinically indicated signs/symptoms require(s) no independent follow-up. 3.1 x 2.3 cm left adrenal lesion is similar to on the prior exam and was low-density on 06/15/2022 PET, most consistent with an adenoma. Normal urinary bladder. Stomach/Bowel: Normal stomach, without wall thickening. Colonic stool burden suggests constipation. Normal terminal ileum and appendix. Normal small bowel. Vascular/Lymphatic: Advanced aortic and branch vessel atherosclerosis. No abdominopelvic adenopathy. Reproductive: Multiple uterine fibroids including at up to 2.3 cm. No adnexal mass. Other: No significant free fluid. No evidence of omental or peritoneal disease. Musculoskeletal: No acute osseous abnormality.  Disc bulge at L4-5. IMPRESSION: CT CHEST IMPRESSION 1. Mildly progressive pulmonary metastasis. 2. Right scapular osseous metastasis is slightly smaller today. No new osseous metastasis identified. 3. No thoracic adenopathy. 4. Aortic atherosclerosis (ICD10-I70.0), coronary artery atherosclerosis and emphysema (ICD10-J43.9). CT ABDOMEN AND PELVIS IMPRESSION 1. Mildly progressive hepatic metastasis. 2. Left adrenal adenoma. 3. Fibroid uterus Electronically Signed   By: Jeronimo Greaves M.D.   On: 05/23/2023 15:11    PERFORMANCE STATUS (ECOG) : 1 - Symptomatic but completely ambulatory  Review of Systems Unless otherwise noted, a complete review of systems is negative.  Physical Exam General: NAD Pulmonary: Unlabored Extremities: no edema, no joint deformities Skin: no rashes Neurological: Weakness but otherwise nonfocal  IMPRESSION: Patient seen in clinic for follow-up.  She continues to endorse right arm pain.  She has been taking 2 Percocet every 4 hours including at night.  Patient says that the Percocet will help lessen the pain for a few hours before wearing off.  Overall, she feels the pain is poorly controlled.  She says that the pain  is primarily in her arm and not shoulder or back.  She has noted to have a right scapular mass, which has previously been radiated.  Recent x-ray of the humerus did not show any new findings.  Patient does endorse and appears to have a soft tissue mass near her bicep.  However, this is not focally tender nor red or fluctuant.  Discussed with Dr. Donneta Romberg.  Will refer patient back to radiation oncology for consideration of additional XRT.  Will also reach out to IR to explore any other interventional options for pain management.  Will increase OxyContin to 60 mg every 12 hours and continue Percocet as needed for breakthrough pain.  Patient endorses depression. Recommended referral for counseling/[facility] but patient declined. She says she will think about it.   PLAN: -Continue current scope of treatment -Continue Percocet -Increase OxyContin 60 mg every 12 hours -Referral for consideration of XRT -Bone scan -Referral to IR -Recommend referral for counseling services if patient agrees -MOST Form previously reviewed -Follow-up telephone visit 2 weeks  Case and plan discussed with Dr. Donneta Romberg  Patient expressed understanding and was in agreement with this plan. She also understands that She can call the clinic at any time with any questions, concerns, or complaints.     Time Total: 20 minutes  Visit consisted of counseling and education dealing with the complex and emotionally intense issues of symptom management and palliative care in the setting of serious and potentially life-threatening illness.Greater than 50%  of this time was spent counseling and coordinating care related to the above assessment and plan.  Signed by: Laurette Schimke, PhD, NP-C

## 2023-06-20 ENCOUNTER — Encounter: Payer: Self-pay | Admitting: Internal Medicine

## 2023-06-20 ENCOUNTER — Other Ambulatory Visit: Payer: Self-pay | Admitting: *Deleted

## 2023-06-20 ENCOUNTER — Other Ambulatory Visit: Payer: Self-pay

## 2023-06-20 DIAGNOSIS — C3411 Malignant neoplasm of upper lobe, right bronchus or lung: Secondary | ICD-10-CM

## 2023-06-20 MED ORDER — OXYCODONE-ACETAMINOPHEN 10-325 MG PO TABS
1.0000 | ORAL_TABLET | ORAL | 0 refills | Status: DC | PRN
Start: 2023-06-20 — End: 2023-06-30
  Filled 2023-06-20: qty 90, 7d supply, fill #0

## 2023-06-23 ENCOUNTER — Encounter
Admission: RE | Admit: 2023-06-23 | Discharge: 2023-06-23 | Disposition: A | Discharge status: Home / Self Care | Payer: Medicare Other | Source: Ambulatory Visit | Attending: Internal Medicine | Admitting: Internal Medicine

## 2023-06-23 DIAGNOSIS — M79601 Pain in right arm: Secondary | ICD-10-CM | POA: Diagnosis present

## 2023-06-23 DIAGNOSIS — C3411 Malignant neoplasm of upper lobe, right bronchus or lung: Secondary | ICD-10-CM | POA: Diagnosis present

## 2023-06-23 MED ORDER — TECHNETIUM TC 99M MEDRONATE IV KIT
20.0000 | PACK | Freq: Once | INTRAVENOUS | Status: AC | PRN
  Administered 2023-06-23: 20.99 via INTRAVENOUS

## 2023-06-30 ENCOUNTER — Inpatient Hospital Stay (HOSPITAL_BASED_OUTPATIENT_CLINIC_OR_DEPARTMENT_OTHER): Payer: Medicare Other | Admitting: Internal Medicine

## 2023-06-30 ENCOUNTER — Encounter: Payer: Self-pay | Admitting: Internal Medicine

## 2023-06-30 ENCOUNTER — Inpatient Hospital Stay: Payer: Medicare Other

## 2023-06-30 ENCOUNTER — Other Ambulatory Visit: Payer: Self-pay

## 2023-06-30 ENCOUNTER — Inpatient Hospital Stay (HOSPITAL_BASED_OUTPATIENT_CLINIC_OR_DEPARTMENT_OTHER): Payer: Medicare Other | Admitting: Hospice and Palliative Medicine

## 2023-06-30 ENCOUNTER — Ambulatory Visit
Admission: RE | Admit: 2023-06-30 | Discharge: 2023-06-30 | Disposition: A | Discharge status: Home / Self Care | Payer: Medicare Other | Source: Ambulatory Visit | Attending: Radiation Oncology | Admitting: Radiation Oncology

## 2023-06-30 DIAGNOSIS — C7951 Secondary malignant neoplasm of bone: Secondary | ICD-10-CM | POA: Diagnosis not present

## 2023-06-30 DIAGNOSIS — C3411 Malignant neoplasm of upper lobe, right bronchus or lung: Secondary | ICD-10-CM | POA: Diagnosis not present

## 2023-06-30 DIAGNOSIS — G893 Neoplasm related pain (acute) (chronic): Secondary | ICD-10-CM

## 2023-06-30 DIAGNOSIS — Z515 Encounter for palliative care: Secondary | ICD-10-CM | POA: Diagnosis not present

## 2023-06-30 LAB — CBC WITH DIFFERENTIAL (CANCER CENTER ONLY)
Abs Immature Granulocytes: 0.13 10*3/uL — ABNORMAL HIGH (ref 0.00–0.07)
Basophils Absolute: 0 10*3/uL (ref 0.0–0.1)
Basophils Relative: 1 %
Eosinophils Absolute: 0.1 10*3/uL (ref 0.0–0.5)
Eosinophils Relative: 1 %
HCT: 30.7 % — ABNORMAL LOW (ref 36.0–46.0)
Hemoglobin: 10 g/dL — ABNORMAL LOW (ref 12.0–15.0)
Immature Granulocytes: 2 %
Lymphocytes Relative: 18 %
Lymphs Abs: 1.4 10*3/uL (ref 0.7–4.0)
MCH: 29.4 pg (ref 26.0–34.0)
MCHC: 32.6 g/dL (ref 30.0–36.0)
MCV: 90.3 fL (ref 80.0–100.0)
Monocytes Absolute: 1 10*3/uL (ref 0.1–1.0)
Monocytes Relative: 12 %
Neutro Abs: 5.2 10*3/uL (ref 1.7–7.7)
Neutrophils Relative %: 66 %
Platelet Count: 273 10*3/uL (ref 150–400)
RBC: 3.4 MIL/uL — ABNORMAL LOW (ref 3.87–5.11)
RDW: 20.7 % — ABNORMAL HIGH (ref 11.5–15.5)
WBC Count: 7.8 10*3/uL (ref 4.0–10.5)
nRBC: 0 % (ref 0.0–0.2)

## 2023-06-30 LAB — CMP (CANCER CENTER ONLY)
ALT: 11 U/L (ref 0–44)
AST: 26 U/L (ref 15–41)
Albumin: 3.1 g/dL — ABNORMAL LOW (ref 3.5–5.0)
Alkaline Phosphatase: 236 U/L — ABNORMAL HIGH (ref 38–126)
Anion gap: 12 (ref 5–15)
BUN: 13 mg/dL (ref 8–23)
CO2: 25 mmol/L (ref 22–32)
Calcium: 9.1 mg/dL (ref 8.9–10.3)
Chloride: 98 mmol/L (ref 98–111)
Creatinine: 0.47 mg/dL (ref 0.44–1.00)
GFR, Estimated: >60 mL/min (ref >60)
Glucose, Bld: 97 mg/dL (ref 70–99)
Potassium: 4.3 mmol/L (ref 3.5–5.1)
Sodium: 135 mmol/L (ref 135–145)
Total Bilirubin: 0.5 mg/dL (ref 0.3–1.2)
Total Protein: 6.8 g/dL (ref 6.5–8.1)

## 2023-06-30 LAB — LACTATE DEHYDROGENASE: LDH: 504 U/L — ABNORMAL HIGH (ref 98–192)

## 2023-06-30 MED ORDER — OXYCODONE HCL 15 MG PO TABS
15.0000 mg | ORAL_TABLET | ORAL | 0 refills | Status: DC | PRN
  Filled 2023-06-30: qty 90, 8d supply, fill #0

## 2023-06-30 NOTE — Progress Notes (Signed)
Refill percocet, pending.  Appetite 50%, drinks 1 ensure a day, lost 4lbs.  C/o feet swelling.

## 2023-06-30 NOTE — Progress Notes (Signed)
Palliative Medicine [facility]  [phone] [phone]   Name: Stacie Kennedy Date: 06/30/2023 MRN: [id number]  DOB: [birth year]  Patient Care Team: Earna Coder, MD as PCP - General (Internal Medicine) Glory Buff, RN as Oncology Nurse Navigator    REASON FOR CONSULTATION: Stacie Kennedy is a 62 y.o. 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  Patient has had severe pain with scapular metastases and was started on XRT.  She is referred to palliative care to help address goals and manage ongoing symptoms.   SOCIAL HISTORY:     reports that she has been smoking cigarettes. She has a 10.8 pack-year smoking history. She has never used smokeless tobacco. She reports current alcohol use of about 2.0 standard drinks of alcohol per week. She reports that she does not use drugs.  Patient lives at home with her boyfriend.  ADVANCE DIRECTIVES:  Does not have  CODE STATUS:   PAST MEDICAL HISTORY: Past Medical History:  Diagnosis Date   Cancer (HCC)    lung cancer   Cancer associated pain    Depression    Duodenitis    Dyspnea    Family history of cancer    High cholesterol    Personal history of colonic polyps    Pre-diabetes    Umbilical hernia    UTI (urinary tract infection)     PAST SURGICAL HISTORY:  Past Surgical History:  Procedure Laterality Date   COLONOSCOPY WITH PROPOFOL N/A 03/24/2021   Procedure: COLONOSCOPY WITH PROPOFOL;  Surgeon: Wyline Mood, MD;  Location: [facility] ENDOSCOPY;  Service: Gastroenterology;  Laterality: N/A;   INTERCOSTAL NERVE BLOCK  09/19/2020   Procedure: INTERCOSTAL NERVE BLOCK;  Surgeon: Loreli Slot, MD;  Location: [facility] OR;  Service: Thoracic;;   IR IMAGING GUIDED PORT INSERTION   09/23/2021   LUNG LOBECTOMY Right    LUNG REMOVAL, PARTIAL Right 09/19/2020   NODE DISSECTION Right 09/19/2020   Procedure: NODE DISSECTION;  Surgeon: Loreli Slot, MD;  Location: [facility] OR;  Service: Thoracic;  Laterality: Right;   VIDEO BRONCHOSCOPY N/A 07/08/2021   Procedure: VIDEO BRONCHOSCOPY;  Surgeon: Loreli Slot, MD;  Location: [facility] OR;  Service: Thoracic;  Laterality: N/A;    HEMATOLOGY/ONCOLOGY HISTORY:  Oncology History Overview Note  # NOV -[id number] [LUNG CANCER SCREENING PROGRAM]-18 mm right upper lobe lung nodule; DEC 2021- s/p right upper lobectomy [Dr. Dorris Fetch; [place]]; STAGE: I [pT-18 mm; LN-12=0]; predominant large cell neuroendocrine; minor adenocarcinoma.  DeclineD adjuvant chemotherapy.  #Recurrent/stage IV cancer NOV 2022- PET scan-scapular lesion liver lesion gastrohepatic lymphadenopathy.  11/21- start RT to right scapular lesion  # DEC 3rd, 2022- CARBO=ETOP+TECEN; udenyca #1; SEP 2023- STOP Tencetriq- progression  SEP 2023-liver biopsy shows neuroendocrine carcinoma/large cell; no adenocarcinoma noted.  Discontinue Tecentriq any progression of disease.  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   # OCTOBER, 2023-s /p carboplatin etoposide x4 cycles- without immunotherapy [progressed while on Atezo].  CT scan FEB 1st, 2024- Two new solid pulmonary nodules, largest 0.8 cm in the posterior left upper lobe, suspicious for new pulmonary metastases. Two enlarging left liver metastases, including substantial growth of the dominant bulky 9.7 cm segment 3 left liver metastasis. Stable expansile mixed lytic and sclerotic right scapular body metastasis.  # FEB 2024- Lubrinectidin cycle #3-progressive disease-noted; discontinue Lubrinectidin.   #  MAY 3rd, 2024- IPI-1+NIVO-3 [KEPASAKE]- cycle #1- CT JUNE 27th-  CT CAP-  Multiple new and enlarged bilateral pulmonary nodules, consistent with worsened pulmonary metastatic disease;  Continued interval enlargement of very bulky metastases occupying the left lobe of the liver. Unchanged mixed lytic and sclerotic osseous metastatic lesion of the right scapular body.  DISCONTINUE IPI+ NIVO given the progressive disease.  # JULY 8th, 2024- Gemcitabine x2 ccyles- AUG 14th CT CAP- PROGRESSIVE.   # # MAY 2023- Right swollen eye/orbital inflammation/uveitis s/p Zometa status post steroids improved.-reviewed literature case reports noted; less concerning for tumor involvement.  DISCONTINUE ZOMETA.  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  Visceral Pleura Invasion: Not identified  Direct Invasion of Adjacent Structures: No adjacent structures present  Lymphovascular Invasion: Not identified  Margins: All margins negative for invasive carcinoma       Closest Margin(s) to Invasive Carcinoma: Bronchovascular margin  Treatment Effect: No known presurgical therapy  Regional Lymph Nodes:       Number of Lymph Nodes Involved: 0                            Nodal Sites with Tumor: Not applicable       Number of Lymph Nodes Examined: 12      Primary cancer of right upper lobe of lung (HCC)  11/03/2020 Initial Diagnosis   Primary cancer of right upper lobe of lung (HCC)   11/03/2020 Cancer Staging   Staging form: Lung, AJCC 8th Edition - Pathologic: Stage IA3 (pT1c, pN0, cM0) - Signed by Earna Coder, MD on 11/04/2020   09/14/2021 - 04/30/2022 Chemotherapy   Patient is on Treatment Plan : LUNG SCLC Carboplatin + Etoposide + Atezolizumab Induction q21d / Atezolizumab Maintenance q21d     09/15/2021 - 06/18/2022 Chemotherapy   Patient is on  Treatment Plan : LUNG SCLC Carboplatin + Etoposide + Atezolizumab Induction q21d x 4 cycles / Atezolizumab Maintenance q21d     11/09/2021 Cancer Staging   Staging form: Lung, AJCC 8th Edition - Pathologic: Stage IVB (pTX, pNX, cM1c) - Signed by Earna Coder, MD on 11/09/2021   06/22/2022 Pathology Results   Liver biopsy showed metastatic cancer with neuroendocrine features    08/09/2022 - 10/22/2022 Chemotherapy   Patient is on Treatment Plan : BRAIN Carboplatin (AUC 5) D1 + Etoposide (120) D1-3 q28d     11/26/2022 - 01/10/2023 Chemotherapy   Patient is on Treatment Plan : LUNG SMALL CELL Lurbinectedin q21d     02/11/2023 - 03/25/2023 Chemotherapy   Patient is on Treatment Plan : MELANOMA Nivolumab (3) + Ipilimumab (1) q21d / Nivolumab (240) q14d     04/18/2023 - 05/16/2023 Chemotherapy   Patient is on Treatment Plan : LUNG Gemcitabine (1000) D1,8 q21d       ALLERGIES:  has No Known Allergies.  MEDICATIONS:  Current Outpatient Medications  Medication Sig Dispense Refill   albuterol (PROVENTIL HFA) 108 (90 Base) MCG/ACT inhaler Inhale 2 puffs into the lungs once every 6 (six) hours as needed for wheezing or shortness of breath. 18 g 2   DULoxetine (CYMBALTA) 30 MG  capsule Take 1 capsule (30 mg total) by mouth daily. For nausea, appetite, sleep 30 capsule 3   lidocaine-prilocaine (EMLA) cream Apply one application topically the the affected area(s) daily as needed. 30 g 3   loratadine (CLARITIN) 10 MG tablet Take 1 tablet (10 mg total) by mouth daily. 30 tablet 2   metFORMIN (GLUCOPHAGE) 500 MG tablet Take 1 tablet (500 mg total) by mouth daily with breakfast. 90 tablet 3   naloxone (NARCAN) nasal spray 4 mg/0.1 mL SPRAY 1 SPRAY INTO ONE NOSTRIL AS DIRECTED FOR OPIOID OVERDOSE (TURN PERSON ON SIDE AFTER DOSE. IF NO RESPONSE IN 2-3 MINUTES OR PERSON RESPONDS BUT RELAPSES, REPEAT USING A NEW SPRAY DEVICE AND SPRAY INTO THE OTHER NOSTRIL. CALL 911 AFTER USE.) * EMERGENCY USE ONLY * (Patient  not taking: Reported on 06/14/2023) 2 each 0   OLANZapine (ZYPREXA) 5 MG tablet Take 1 tablet (5 mg total) by mouth at bedtime. 30 tablet 3   ondansetron (ZOFRAN) 4 MG tablet TAKE 2 TABLETS (8MG ) BY MOUTH ONCE EVERY 8 HOURS AS NEEDED FOR NAUSEA OR VOMITING. 80 tablet 1   oxyCODONE (OXYCONTIN) 60 MG 12 hr tablet Take 1 tablet by mouth every 12 (twelve) hours. 60 tablet 0   prochlorperazine (COMPAZINE) 10 MG tablet Take 1 tablet (10 mg total) by mouth once every 6 (six) hours as needed for nausea or vomiting. 40 tablet 1   psyllium (METAMUCIL) 58.6 % packet Take 1 packet by mouth daily.     No current facility-administered medications for this visit.   Facility-Administered Medications Ordered in Other Visits  Medication Dose Route Frequency Provider Last Rate Last Admin   heparin lock flush 100 UNIT/ML injection            heparin lock flush 100 unit/mL  500 Units Intravenous Once Louretta Shorten R, MD       morphine (PF) 2 MG/ML injection            sodium chloride flush (NS) 0.9 % injection 10 mL  10 mL Intravenous PRN Earna Coder, MD   10 mL at 03/01/22 0857    VITAL SIGNS: There were no vitals taken for this visit. There were no vitals filed for this visit.   Estimated body mass index is 19.45 kg/m as calculated from the following:   Height as of an earlier encounter on 06/30/23: 5\' 7"  (1.702 m).   Weight as of an earlier encounter on 06/30/23: 124 lb 3.2 oz (56.3 kg).  LABS: CBC:    Component Value Date/Time   WBC 7.8 06/30/2023 1004   WBC 3.9 (L) 01/31/2023 0857   HGB 10.0 (L) 06/30/2023 1004   HGB 15.5 11/26/2020 1214   HCT 30.7 (L) 06/30/2023 1004   HCT 47.6 (H) 11/26/2020 1214   PLT 273 06/30/2023 1004   PLT 317 11/26/2020 1214   MCV 90.3 06/30/2023 1004   MCV 87 11/26/2020 1214   NEUTROABS 5.2 06/30/2023 1004   NEUTROABS 2.5 11/26/2020 1214   LYMPHSABS 1.4 06/30/2023 1004   LYMPHSABS 3.6 (H) 11/26/2020 1214   MONOABS 1.0 06/30/2023 1004   EOSABS 0.1  06/30/2023 1004   EOSABS 0.2 11/26/2020 1214   BASOSABS 0.0 06/30/2023 1004   BASOSABS 0.1 11/26/2020 1214   Comprehensive Metabolic Panel:    Component Value Date/Time   NA 135 06/30/2023 1004   NA 139 11/26/2020 1214   K 4.3 06/30/2023 1004   CL 98 06/30/2023 1004   CO2 25 06/30/2023 1004   BUN  13 06/30/2023 1004   BUN 8 11/26/2020 1214   CREATININE 0.47 06/30/2023 1004   GLUCOSE 97 06/30/2023 1004   CALCIUM 9.1 06/30/2023 1004   AST 26 06/30/2023 1004   ALT 11 06/30/2023 1004   ALKPHOS 236 (H) 06/30/2023 1004   BILITOT 0.5 06/30/2023 1004   PROT 6.8 06/30/2023 1004   PROT 6.9 11/26/2020 1214   ALBUMIN 3.1 (L) 06/30/2023 1004   ALBUMIN 4.0 11/26/2020 1214    RADIOGRAPHIC STUDIES: DG Humerus Right  Result Date: 06/02/2023 CLINICAL DATA:  Right arm pain for several years. History of lung cancer. EXAM: RIGHT HUMERUS - 2+ VIEW COMPARISON:  July 30, 2022. FINDINGS: No fracture or other bony abnormality is seen involving the right humerus. Stable expansile sclerotic lesion is seen involving right scapula as noted on prior study consistent with metastatic lesion. IMPRESSION: Normal right humerus. Stable expansile sclerotic right scapular lesion is noted above. Electronically Signed   By: Lupita Raider M.D.   On: 06/02/2023 11:59    PERFORMANCE STATUS (ECOG) : 1 - Symptomatic but completely ambulatory  Review of Systems Unless otherwise noted, a complete review of systems is negative.  Physical Exam General: NAD Pulmonary: Unlabored Extremities: no edema, no joint deformities Skin: no rashes Neurological: Weakness but otherwise nonfocal  IMPRESSION: Patient seen in clinic for follow-up.  Patient off treatment with progressive disease.  She is no longer treatment candidate.  I had a lengthy conversation with patient today regarding end-of-life care, symptom management, and recommendation for hospice.  Patient is familiar with hospice through the care of of her mother  and sister.  Patient says that she is willing to consider in the future but does not feel that she is ready for hospice at this time.  We also discussed CODE STATUS.  I strongly recommended DNR/DNI and described the futility associated with resuscitative efforts in the setting of end-stage cancer.  Patient said that she needed to speak with her family prior to making any decisions.  I recommended that patient bring her family in to the clinic at time of next visit to facilitate further discussions.  Symptomatically, she continues to endorse pain in the shoulder and scapula area.  She has been referred for XRT.  Patient has not found much improvement with dose escalation of OxyContin.  She says that the Percocet is helping slightly but is short-lived with effects wearing off after couple of hours.  Will rotate from Percocet to oxycodone IR and liberalize dose to 15 to 30 mg every 4 hours as needed.  I have placed a referral to IR for consideration of RFA.  Patient is status post bone scan on 9/12 but this has not yet been read by radiology.  PLAN: -Continue current scope of treatment -DC Percocet -Start oxycodone IR 15 to 30 mg every 4 hours as needed #90 -Continue OxyContin 60 mg every 12 hours -Pending evaluation by rad onc and IR -MOST Form previously reviewed -RTC 1 month  Case and plan discussed with Dr. Donneta Romberg  Patient expressed understanding and was in agreement with this plan. She also understands that She can call the clinic at any time with any questions, concerns, or complaints.     Time Total: 20 minutes  Visit consisted of counseling and education dealing with the complex and emotionally intense issues of symptom management and palliative care in the setting of serious and potentially life-threatening illness.Greater than 50%  of this time was spent counseling and coordinating care related to the above  assessment and plan.  Signed by: Laurette Schimke, PhD, NP-C

## 2023-06-30 NOTE — Progress Notes (Signed)
[facility] CONSULT NOTE  Patient Care Team: Earna Coder, MD as PCP - General (Internal Medicine) Glory Buff, RN as Oncology Nurse Navigator  CHIEF COMPLAINTS/PURPOSE OF CONSULTATION: lung cancer  #  Oncology History Overview Note  # NOV -[id number] [facility] CANCER SCREENING PROGRAM]-18 mm right upper lobe lung nodule; DEC 2021- s/p right upper lobectomy [Dr. Dorris Fetch; [place]]; STAGE: I [pT-18 mm; LN-12=0]; predominant large cell neuroendocrine; minor adenocarcinoma.  DeclineD adjuvant chemotherapy.  #Recurrent/stage IV cancer NOV 2022- PET scan-scapular lesion liver lesion gastrohepatic lymphadenopathy.  11/21- start RT to right scapular lesion  # DEC 3rd, 2022- CARBO=ETOP+TECEN; udenyca #1; SEP 2023- STOP Tencetriq- progression  SEP 2023-liver biopsy shows neuroendocrine carcinoma/large cell; no adenocarcinoma noted.  Discontinue Tecentriq any progression of disease.  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   # OCTOBER, 2023-s /p carboplatin etoposide x4 cycles- without immunotherapy [progressed while on Atezo].  CT scan FEB 1st, 2024- Two new solid pulmonary nodules, largest 0.8 cm in the posterior left upper lobe, suspicious for new pulmonary metastases. Two enlarging left liver metastases, including substantial growth of the dominant bulky 9.7 cm segment 3 left liver metastasis. Stable expansile mixed lytic and sclerotic right scapular body metastasis.  # FEB 2024- Lubrinectidin cycle #3-progressive disease-noted; discontinue Lubrinectidin.   # MAY 3rd, 2024- IPI-1+NIVO-3 [KEPASAKE]- cycle #1- CT JUNE 27th-  CT CAP-  Multiple new and enlarged bilateral pulmonary nodules, consistent with worsened pulmonary metastatic disease; Continued interval enlargement of very bulky metastases occupying the left lobe of the liver. Unchanged mixed lytic and sclerotic osseous metastatic lesion of the right scapular body.  DISCONTINUE IPI+ NIVO  given the progressive disease.  # JULY 8th, 2024- Gemcitabine x2 ccyles- AUG 14th CT CAP- PROGRESSIVE.   # # MAY 2023- Right swollen eye/orbital inflammation/uveitis s/p Zometa status post steroids improved.-reviewed literature case reports noted; less concerning for tumor involvement.  DISCONTINUE ZOMETA.  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  Visceral Pleura Invasion: Not identified  Direct Invasion of Adjacent Structures: No adjacent structures present  Lymphovascular Invasion: Not identified  Margins: All margins negative for invasive carcinoma       Closest Margin(s) to Invasive Carcinoma: Bronchovascular margin  Treatment Effect: No known presurgical therapy  Regional Lymph Nodes:       Number of Lymph Nodes Involved: 0                            Nodal Sites with Tumor: Not applicable       Number of Lymph Nodes Examined: 12      Primary cancer of right upper lobe of lung (HCC)  11/03/2020 Initial Diagnosis   Primary cancer of right upper lobe of lung (HCC)   11/03/2020 Cancer Staging   Staging form: Lung, AJCC 8th Edition - Pathologic: Stage IA3 (pT1c, pN0, cM0) - Signed by Earna Coder, MD on 11/04/2020   09/14/2021 - 04/30/2022 Chemotherapy   Patient is on Treatment Plan : LUNG SCLC Carboplatin + Etoposide + Atezolizumab Induction q21d / Atezolizumab Maintenance q21d     09/15/2021 - 06/18/2022 Chemotherapy  Patient is on Treatment Plan : LUNG SCLC Carboplatin + Etoposide + Atezolizumab Induction q21d x 4 cycles / Atezolizumab Maintenance q21d     11/09/2021 Cancer Staging   Staging form: Lung, AJCC 8th Edition - Pathologic:  Stage IVB (pTX, pNX, cM1c) - Signed by Earna Coder, MD on 11/09/2021   06/22/2022 Pathology Results   Liver biopsy showed metastatic cancer with neuroendocrine features    08/09/2022 - 10/22/2022 Chemotherapy   Patient is on Treatment Plan : BRAIN Carboplatin (AUC 5) D1 + Etoposide (120) D1-3 q28d     11/26/2022 - 01/10/2023 Chemotherapy   Patient is on Treatment Plan : LUNG SMALL CELL Lurbinectedin q21d     02/11/2023 - 03/25/2023 Chemotherapy   Patient is on Treatment Plan : MELANOMA Nivolumab (3) + Ipilimumab (1) q21d / Nivolumab (240) q14d     04/18/2023 - 05/16/2023 Chemotherapy   Patient is on Treatment Plan : LUNG Gemcitabine (1000) D1,8 q21d      HISTORY OF PRESENTING ILLNESS: Ambulating independently.  Alone.   Stacie Kennedy 62 y.o.  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  The interim evaluated by [facility] for ongoing arm pain.  Also status post evaluation with radiation oncology.   C/o feet swelling; Appetite 50%, drinks 1 ensure a day, lost 4lbs.  Appetite is only 50% normal.   Currently On O2, 2 liters at home.  C/o pain in right shoulder/arm. Patient says that her pain is stable on OxyContin with as needed Percocet.  On average, she is taking 2 Percocets q 3-4 hours daily.   Complains of a lump around the right temple.  Also complains of dizziness. Chronic mild to moderate constipation. NO skin rash.   Review of Systems  Constitutional:  Positive for malaise/fatigue and weight loss. Negative for chills, diaphoresis and fever.  HENT:  Negative for nosebleeds and sore throat.   Eyes:  Negative for double vision.  Respiratory:  Negative for cough, hemoptysis, sputum production, shortness of breath and wheezing.   Cardiovascular:  Negative for chest pain, palpitations, orthopnea and leg swelling.   Gastrointestinal:  Positive for nausea and vomiting. Negative for abdominal pain, blood in stool, constipation, diarrhea, heartburn and melena.  Genitourinary:  Negative for dysuria, frequency and urgency.  Musculoskeletal:  Positive for back pain, joint pain and myalgias.  Skin: Negative.  Negative for itching and rash.  Neurological:  Negative for dizziness, tingling, focal weakness, weakness and headaches.  Endo/Heme/Allergies:  Does not bruise/bleed easily.  Psychiatric/Behavioral:  Negative for depression. The patient is not nervous/anxious and does not have insomnia.      MEDICAL HISTORY:  Past Medical History:  Diagnosis Date   Cancer [facility])    lung cancer   Cancer associated pain    Depression    Duodenitis    Dyspnea    Family history of cancer    High cholesterol    Personal history of colonic polyps    Pre-diabetes    Umbilical hernia    UTI (urinary tract infection)     SURGICAL HISTORY: Past Surgical History:  Procedure Laterality Date   COLONOSCOPY WITH PROPOFOL N/A 03/24/2021   Procedure: COLONOSCOPY WITH PROPOFOL;  Surgeon: Wyline Mood, MD;  Location: [facility] ENDOSCOPY;  Service: Gastroenterology;  Laterality: N/A;   INTERCOSTAL NERVE BLOCK  09/19/2020   Procedure: INTERCOSTAL NERVE BLOCK;  Surgeon: Loreli Slot, MD;  Location: [facility]  OR;  Service: Thoracic;;   IR IMAGING GUIDED PORT INSERTION  09/23/2021   LUNG LOBECTOMY Right    LUNG REMOVAL, PARTIAL Right 09/19/2020   NODE DISSECTION Right 09/19/2020   Procedure: NODE DISSECTION;  Surgeon: Loreli Slot, MD;  Location: [facility] OR;  Service: Thoracic;  Laterality: Right;   VIDEO BRONCHOSCOPY N/A 07/08/2021   Procedure: VIDEO BRONCHOSCOPY;  Surgeon: Loreli Slot, MD;  Location: [facility] OR;  Service: Thoracic;  Laterality: N/A;    SOCIAL HISTORY: Social History   Socioeconomic History   Marital status: Single    Spouse name: Not on file   Number of children: Not on file   Years of education:  Not on file   Highest education level: Not on file  Occupational History   Not on file  Tobacco Use   Smoking status: Some Days    Current packs/day: 0.25    Average packs/day: 0.3 packs/day for 43.0 years (10.8 ttl pk-yrs)    Types: Cigarettes   Smokeless tobacco: Never   Tobacco comments:    states she is slowing, trying to quit  Vaping Use   Vaping status: Never Used  Substance and Sexual Activity   Alcohol use: Yes    Alcohol/week: 2.0 standard drinks of alcohol    Types: 2 Cans of beer per week    Comment: occasional   Drug use: No   Sexual activity: Not on file  Other Topics Concern   Not on file  Social History Narrative   Lives in [place]; smokes; now and then beer; with boy friend. 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    Social Determinants of Health   Financial Resource Strain: High Risk (04/22/2022)   Overall Financial Resource Strain (CARDIA)    Difficulty of Paying Living Expenses: Hard  Food Insecurity: Food Insecurity Present (07/09/2021)   Hunger Vital Sign    Worried About Running Out of Food in the Last Year: Sometimes true    Ran Out of Food in the Last Year: Sometimes true  Transportation Needs: No Transportation Needs (07/09/2021)   PRAPARE - Administrator, Civil Service (Medical): No    Lack of Transportation (Non-Medical): No  Physical Activity: Insufficiently Active (04/16/2021)   Exercise Vital Sign    Days of Exercise per Week: 7 days    Minutes of Exercise per Session: 20 min  Stress: Stress Concern Present (04/22/2022)   [employer] of Occupational Health - Occupational Stress Questionnaire    Feeling of Stress : Very much  Social Connections: Socially Isolated (04/22/2022)   Social Connection and Isolation Panel [NHANES]    Frequency of Communication with Friends and Family: Once a week    Frequency of Social Gatherings with Friends and Family: Once a week    Attends Religious Services: Never    Loss adjuster, chartered or Organizations: No    Attends Banker Meetings: Never    Marital Status: Living with partner  Intimate Partner Violence: At Risk (04/22/2022)   Humiliation, Afraid, Rape, and Kick questionnaire    Fear of Current or Ex-Partner: No    Emotionally Abused: Yes    Physically Abused: No    Sexually Abused: No    FAMILY HISTORY: Family History  Problem Relation Age of Onset   Diabetes Mother    Cancer Mother    Diabetes Father    Stroke Father    Heart attack Father        83s   Healthy Sister  Cancer Brother    Hepatitis Brother    Diabetes Brother    Hypertension Brother    Heart disease Brother     ALLERGIES:  has No Known Allergies.  MEDICATIONS:  Current Outpatient Medications  Medication Sig Dispense Refill   albuterol (PROVENTIL HFA) 108 (90 Base) MCG/ACT inhaler Inhale 2 puffs into the lungs once every 6 (six) hours as needed for wheezing or shortness of breath. 18 g 2   DULoxetine (CYMBALTA) 30 MG capsule Take 1 capsule (30 mg total) by mouth daily. For nausea, appetite, sleep 30 capsule 3   lidocaine-prilocaine (EMLA) cream Apply one application topically the the affected area(s) daily as needed. 30 g 3   loratadine (CLARITIN) 10 MG tablet Take 1 tablet (10 mg total) by mouth daily. 30 tablet 2   metFORMIN (GLUCOPHAGE) 500 MG tablet Take 1 tablet (500 mg total) by mouth daily with breakfast. 90 tablet 3   OLANZapine (ZYPREXA) 5 MG tablet Take 1 tablet (5 mg total) by mouth at bedtime. 30 tablet 3   ondansetron (ZOFRAN) 4 MG tablet TAKE 2 TABLETS (8MG ) BY MOUTH ONCE EVERY 8 HOURS AS NEEDED FOR NAUSEA OR VOMITING. 80 tablet 1   oxyCODONE (OXYCONTIN) 60 MG 12 hr tablet Take 1 tablet by mouth every 12 (twelve) hours. 60 tablet 0   prochlorperazine (COMPAZINE) 10 MG tablet Take 1 tablet (10 mg total) by mouth once every 6 (six) hours as needed for nausea or vomiting. 40 tablet 1   psyllium (METAMUCIL) 58.6 % packet Take 1 packet by mouth daily.      naloxone (NARCAN) nasal spray 4 mg/0.1 mL SPRAY 1 SPRAY INTO ONE NOSTRIL AS DIRECTED FOR OPIOID OVERDOSE (TURN PERSON ON SIDE AFTER DOSE. IF NO RESPONSE IN 2-3 MINUTES OR PERSON RESPONDS BUT RELAPSES, REPEAT USING A NEW SPRAY DEVICE AND SPRAY INTO THE OTHER NOSTRIL. CALL 911 AFTER USE.) * EMERGENCY USE ONLY * (Patient not taking: Reported on 06/14/2023) 2 each 0   oxyCODONE (ROXICODONE) 15 MG immediate release tablet Take 1-2 tablets (15-30 mg total) by mouth every 4 (four) hours as needed for pain. 90 tablet 0   No current facility-administered medications for this visit.   Facility-Administered Medications Ordered in Other Visits  Medication Dose Route Frequency Provider Last Rate Last Admin   heparin lock flush 100 UNIT/ML injection            heparin lock flush 100 unit/mL  500 Units Intravenous Once Louretta Shorten R, MD       morphine (PF) 2 MG/ML injection            sodium chloride flush (NS) 0.9 % injection 10 mL  10 mL Intravenous PRN Earna Coder, MD   10 mL at 03/01/22 0857      .  PHYSICAL EXAMINATION: ECOG PERFORMANCE STATUS: 0 - Asymptomatic  Vitals:   06/30/23 0950  BP: 104/63  Pulse: (!) 59  Temp: 98.7 F (37.1 C)      Filed Weights   06/30/23 0950  Weight: 124 lb 3.2 oz (56.3 kg)    Hepatomegaly noted.   Physical Exam HENT:     Head: Normocephalic and atraumatic.     Mouth/Throat:     Pharynx: No oropharyngeal exudate.  Eyes:     Pupils: Pupils are equal, round, and reactive to light.  Cardiovascular:     Rate and Rhythm: Normal rate and regular rhythm.  Pulmonary:     Effort: Pulmonary effort is normal. No respiratory distress.  Breath sounds: Normal breath sounds. No wheezing.  Abdominal:     General: Bowel sounds are normal. There is no distension.     Palpations: Abdomen is soft. There is no mass.     Tenderness: There is no abdominal tenderness. There is no guarding or rebound.  Musculoskeletal:        General: No tenderness.  Normal range of motion.     Cervical back: Normal range of motion and neck supple.  Skin:    General: Skin is warm.  Neurological:     Mental Status: She is alert and oriented to person, place, and time.  Psychiatric:        Mood and Affect: Affect normal.      LABORATORY DATA:  I have reviewed the data as listed Lab Results  Component Value Date   WBC 7.8 06/30/2023   HGB 10.0 (L) 06/30/2023   HCT 30.7 (L) 06/30/2023   MCV 90.3 06/30/2023   PLT 273 06/30/2023   Recent Labs    05/16/23 1023 05/30/23 1043 06/30/23 1004  NA 135 133* 135  K 3.4* 3.5 4.3  CL 100 101 98  CO2 24 24 25   GLUCOSE 98 88 97  BUN 10 8 13   CREATININE 0.55 0.41* 0.47  CALCIUM 9.3 8.9 9.1  GFRNONAA >60 >60 >60  PROT 7.1 6.5 6.8  ALBUMIN 3.5 3.3* 3.1*  AST 24 24 26   ALT 11 10 11   ALKPHOS 193* 168* 236*  BILITOT 0.2* 0.3 0.5    RADIOGRAPHIC STUDIES: I have personally reviewed the radiological images as listed and agreed with the findings in the report. DG Humerus Right  Result Date: 06/02/2023 CLINICAL DATA:  Right arm pain for several years. History of lung cancer. EXAM: RIGHT HUMERUS - 2+ VIEW COMPARISON:  July 30, 2022. FINDINGS: No fracture or other bony abnormality is seen involving the right humerus. Stable expansile sclerotic lesion is seen involving right scapula as noted on prior study consistent with metastatic lesion. IMPRESSION: Normal right humerus. Stable expansile sclerotic right scapular lesion is noted above. Electronically Signed   By: Lupita Raider M.D.   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  Progression noted on currently s/p  gemcitabine chemotherapy- 2 cylecs- CT AUG 14th, 2024- Mildly progressive pulmonary metastasis;  Right scapular  osseous metastasis is slightly smaller today. No new osseous metastasis identified.  Mildly progressive hepatic metastasis.  # Given progression of disease- and lack of effective chemo options- continue to recommend supportive care. Discussed again re: hospice- declines hospice at this time.   # # COPD: refilled albuterol;-  ambulatory pul Ox- 85; qualification of home O2- stable.  # Right temporal mass- ? Metastatic disease to bone- JULy 2024 MRI brain- c/w mets to scalp-.  stable.  # right foot drop sec to chemo-stable/improved- monitor for now. stable.  # Mild Hypokalemia-  stable.  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 10 fractions.  poorly controlled- Discussed with palliative care re: alternative medications-increasing the dose.  Status post bone scan pending evaluation with Dr. Aggie Cosier regarding radiation.  Also IR referral for possible ablation.  # weight loss/ loss of apetite/ nausea- on zyprexa at bedtime-worsening- sec to progressive cancer  #  constipation:  Miralax BID; fluids; Dulcolax-stable.  # DM:  On Metformin-stable.  #IV access: port functional- s/p TPA- functional  # DISPOSITION:  # Follow up in 1 month - Josh ; labs- cbc/cmp;- [facility] Dr.B # Follow up in 2 months - MD; labs- cbc/cmp; port flush- [facility] Dr.B       Earna Coder, MD 06/30/2023 12:17 PM

## 2023-06-30 NOTE — Assessment & Plan Note (Addendum)
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  Progression noted on currently s/p  gemcitabine chemotherapy- 2 cylecs- CT AUG 14th, 2024- Mildly progressive pulmonary metastasis;  Right scapular osseous metastasis is slightly smaller today. No new osseous metastasis identified.  Mildly progressive hepatic metastasis.  # Given progression of disease- and lack of effective chemo options- continue to recommend supportive care. Discussed again re: hospice- declines hospice at this time.   # # COPD: refilled albuterol;-  ambulatory pul Ox- 85; qualification of home O2- stable.  # Right temporal mass- ? Metastatic disease to bone- JULy 2024 MRI brain- c/w mets to scalp-.  stable.  # right foot drop sec to chemo-stable/improved- monitor for now. stable.  # Mild Hypokalemia-  stable.  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 10 fractions.  poorly controlled- Discussed with palliative care re: alternative medications-increasing the dose.  Status post bone scan pending evaluation with Dr. Aggie Cosier regarding radiation.  Also IR referral for possible ablation.  # weight loss/ loss of apetite/ nausea- on zyprexa at bedtime-worsening- sec to progressive cancer  # constipation:  Miralax BID; fluids; Dulcolax-stable.  # DM:  On Metformin-stable.  #IV access: port functional- s/p TPA- functional  # DISPOSITION:  # Follow up in 1 month - Josh ; labs- cbc/cmp;- [facility] Dr.B # Follow up in 2 months - MD; labs- cbc/cmp; port flush- [facility] Dr.B

## 2023-07-01 ENCOUNTER — Other Ambulatory Visit: Payer: Self-pay

## 2023-07-11 ENCOUNTER — Encounter: Payer: Self-pay | Admitting: Radiation Oncology

## 2023-07-11 ENCOUNTER — Ambulatory Visit
Admission: RE | Admit: 2023-07-11 | Discharge: 2023-07-11 | Disposition: A | Discharge status: Home / Self Care | Payer: Medicare Other | Source: Ambulatory Visit | Attending: Radiation Oncology | Admitting: Radiation Oncology

## 2023-07-11 ENCOUNTER — Other Ambulatory Visit: Payer: Self-pay | Admitting: Radiation Oncology

## 2023-07-11 ENCOUNTER — Other Ambulatory Visit: Payer: Self-pay | Admitting: *Deleted

## 2023-07-11 ENCOUNTER — Other Ambulatory Visit: Payer: Self-pay

## 2023-07-11 VITALS — BP 107/69 | HR 100 | Temp 98.6°F | Resp 20 | Wt 124.2 lb

## 2023-07-11 DIAGNOSIS — C3490 Malignant neoplasm of unspecified part of unspecified bronchus or lung: Secondary | ICD-10-CM

## 2023-07-11 DIAGNOSIS — C7951 Secondary malignant neoplasm of bone: Secondary | ICD-10-CM

## 2023-07-11 MED ORDER — OXYCONTIN 60 MG PO T12A
1.0000 | EXTENDED_RELEASE_TABLET | Freq: Two times a day (BID) | ORAL | 0 refills | Status: DC
  Filled 2023-07-11: qty 60, 30d supply, fill #0

## 2023-07-11 MED ORDER — OXYCODONE HCL 15 MG PO TABS
15.0000 mg | ORAL_TABLET | ORAL | 0 refills | Status: DC | PRN
  Filled 2023-07-11: qty 90, 15d supply, fill #0

## 2023-07-11 NOTE — Progress Notes (Signed)
Radiation Oncology Follow up Note  Name: Stacie Kennedy   Date:   07/11/2023 MRN:  [id number] DOB: 1961/01/31    This 62 y.o. 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  REFERRING PROVIDER: Earna Kennedy, *  HPI: Patient is a 62 year old female well-known to department having been treated approximately 2 years prior to her right scapula for palliation involvement of stage IV lung cancer predominate large cell neuroendocrine tumor..  She does have liver bone scalp metastasis.  She initially did achieve some good palliation of pain in the right scapula although recently CT scans as well as bone scan shows an intense uptake with destruction of her scapula from metastatic disease.  She is having difficulty mobilizing her right upper extremity.  She is currently on narcotic analgesics.  COMPLICATIONS OF TREATMENT: none  FOLLOW UP COMPLIANCE: keeps appointments   PHYSICAL EXAM:  BP 107/69   Pulse 100   Temp 98.6 F (37 C)   Resp 20   Wt 124 lb 3.2 oz (56.3 kg)   SpO2 100%   BMI 19.45 kg/m  Range of motion of right upper extremity is limited by based on pain.  Palpation of her medial scapula on the right does elicit pain.  She is thin cachectic.  Well-developed well-nourished patient in NAD. HEENT reveals PERLA, EOMI, discs not visualized.  Oral cavity is clear. No oral mucosal lesions are identified. Neck is clear without evidence of cervical or supraclavicular adenopathy. Lungs are clear to A&P. Cardiac examination is essentially unremarkable with regular rate and rhythm without murmur rub or thrill. Abdomen is benign with no organomegaly or masses noted. Motor sensory and DTR levels are equal and symmetric in the upper and lower extremities. Cranial nerves II through XII are grossly intact.  Proprioception is intact. No peripheral adenopathy or edema is identified. No motor or sensory levels are noted. Crude visual fields are within normal range.  RADIOLOGY RESULTS: CT scans and bone scan reviewed compatible with above-stated findings  PLAN: This time elect to head when we treat the right scapula with IMRT treatment planning and delivery over 5 fractions.  Will treat to 30 Gray.  Risks and benefits of treatment including skin reaction slight fatigue retreatment of previously radiated area all were discussed in detail with the patient.  I have personally set up and ordered CT simulation for tomorrow.  Patient comprehends her recommendations well.  I would like to take this opportunity to thank you for allowing me to participate in the care of your patient.Stacie Miller, MD

## 2023-07-12 ENCOUNTER — Ambulatory Visit
Admission: RE | Admit: 2023-07-12 | Discharge: 2023-07-12 | Disposition: A | Discharge status: Home / Self Care | Payer: Medicare Other | Source: Ambulatory Visit | Attending: Radiation Oncology | Admitting: Radiation Oncology

## 2023-07-12 DIAGNOSIS — R54 Age-related physical debility: Secondary | ICD-10-CM | POA: Diagnosis not present

## 2023-07-12 DIAGNOSIS — C7951 Secondary malignant neoplasm of bone: Secondary | ICD-10-CM | POA: Diagnosis not present

## 2023-07-12 DIAGNOSIS — C787 Secondary malignant neoplasm of liver and intrahepatic bile duct: Secondary | ICD-10-CM | POA: Insufficient documentation

## 2023-07-12 DIAGNOSIS — C3411 Malignant neoplasm of upper lobe, right bronchus or lung: Secondary | ICD-10-CM | POA: Diagnosis not present

## 2023-07-12 DIAGNOSIS — G893 Neoplasm related pain (acute) (chronic): Secondary | ICD-10-CM | POA: Insufficient documentation

## 2023-07-12 DIAGNOSIS — Z860100 Personal history of colon polyps, unspecified: Secondary | ICD-10-CM | POA: Insufficient documentation

## 2023-07-12 DIAGNOSIS — Z7984 Long term (current) use of oral hypoglycemic drugs: Secondary | ICD-10-CM | POA: Diagnosis not present

## 2023-07-12 DIAGNOSIS — Z510 Encounter for antineoplastic radiation therapy: Secondary | ICD-10-CM | POA: Insufficient documentation

## 2023-07-12 DIAGNOSIS — K449 Diaphragmatic hernia without obstruction or gangrene: Secondary | ICD-10-CM | POA: Insufficient documentation

## 2023-07-12 DIAGNOSIS — R600 Localized edema: Secondary | ICD-10-CM | POA: Diagnosis not present

## 2023-07-12 DIAGNOSIS — F17210 Nicotine dependence, cigarettes, uncomplicated: Secondary | ICD-10-CM | POA: Insufficient documentation

## 2023-07-12 DIAGNOSIS — E46 Unspecified protein-calorie malnutrition: Secondary | ICD-10-CM | POA: Insufficient documentation

## 2023-07-12 DIAGNOSIS — Z8719 Personal history of other diseases of the digestive system: Secondary | ICD-10-CM | POA: Insufficient documentation

## 2023-07-12 DIAGNOSIS — E7800 Pure hypercholesterolemia, unspecified: Secondary | ICD-10-CM | POA: Insufficient documentation

## 2023-07-12 DIAGNOSIS — Z79899 Other long term (current) drug therapy: Secondary | ICD-10-CM | POA: Insufficient documentation

## 2023-07-12 DIAGNOSIS — M7989 Other specified soft tissue disorders: Secondary | ICD-10-CM | POA: Diagnosis not present

## 2023-07-16 DIAGNOSIS — Z510 Encounter for antineoplastic radiation therapy: Secondary | ICD-10-CM | POA: Diagnosis not present

## 2023-07-19 ENCOUNTER — Ambulatory Visit: Payer: Medicare Other

## 2023-07-20 ENCOUNTER — Ambulatory Visit
Admission: RE | Admit: 2023-07-20 | Discharge: 2023-07-20 | Disposition: A | Discharge status: Home / Self Care | Payer: Medicare Other | Source: Ambulatory Visit | Attending: Radiation Oncology | Admitting: Radiation Oncology

## 2023-07-20 ENCOUNTER — Other Ambulatory Visit: Payer: Self-pay

## 2023-07-20 DIAGNOSIS — Z510 Encounter for antineoplastic radiation therapy: Secondary | ICD-10-CM | POA: Diagnosis not present

## 2023-07-20 LAB — RAD ONC ARIA SESSION SUMMARY
Course Elapsed Days: 0
Plan Fractions Treated to Date: 1
Plan Prescribed Dose Per Fraction: 6 Gy
Plan Total Fractions Prescribed: 5
Plan Total Prescribed Dose: 30 Gy
Reference Point Dosage Given to Date: 6 Gy
Reference Point Session Dosage Given: 6 Gy
Session Number: 1

## 2023-07-21 ENCOUNTER — Ambulatory Visit
Admission: RE | Admit: 2023-07-21 | Discharge: 2023-07-21 | Disposition: A | Discharge status: Home / Self Care | Payer: Medicare Other | Source: Ambulatory Visit | Attending: Radiation Oncology | Admitting: Radiation Oncology

## 2023-07-21 ENCOUNTER — Other Ambulatory Visit: Payer: Self-pay

## 2023-07-21 DIAGNOSIS — Z510 Encounter for antineoplastic radiation therapy: Secondary | ICD-10-CM | POA: Diagnosis not present

## 2023-07-21 LAB — RAD ONC ARIA SESSION SUMMARY
Course Elapsed Days: 1
Plan Fractions Treated to Date: 2
Plan Prescribed Dose Per Fraction: 6 Gy
Plan Total Fractions Prescribed: 5
Plan Total Prescribed Dose: 30 Gy
Reference Point Dosage Given to Date: 12 Gy
Reference Point Session Dosage Given: 6 Gy
Session Number: 2

## 2023-07-22 ENCOUNTER — Ambulatory Visit: Payer: Medicare Other

## 2023-07-25 ENCOUNTER — Other Ambulatory Visit: Payer: Self-pay

## 2023-07-25 ENCOUNTER — Ambulatory Visit
Admission: RE | Admit: 2023-07-25 | Discharge: 2023-07-25 | Disposition: A | Discharge status: Home / Self Care | Payer: Medicare Other | Source: Ambulatory Visit | Attending: Radiation Oncology | Admitting: Radiation Oncology

## 2023-07-25 ENCOUNTER — Telehealth: Payer: Self-pay | Admitting: *Deleted

## 2023-07-25 DIAGNOSIS — Z510 Encounter for antineoplastic radiation therapy: Secondary | ICD-10-CM | POA: Diagnosis not present

## 2023-07-25 LAB — RAD ONC ARIA SESSION SUMMARY
Course Elapsed Days: 5
Plan Fractions Treated to Date: 3
Plan Prescribed Dose Per Fraction: 6 Gy
Plan Total Fractions Prescribed: 5
Plan Total Prescribed Dose: 30 Gy
Reference Point Dosage Given to Date: 18 Gy
Reference Point Session Dosage Given: 6 Gy
Session Number: 3

## 2023-07-25 NOTE — Telephone Encounter (Signed)
Pt added to [facility] schedule tomorrow to see lauren, NP - new nodules on her back and head, her legs are swollen and she has fallen several times at home. And weakness.

## 2023-07-26 ENCOUNTER — Inpatient Hospital Stay (HOSPITAL_BASED_OUTPATIENT_CLINIC_OR_DEPARTMENT_OTHER): Payer: Medicare Other | Admitting: Nurse Practitioner

## 2023-07-26 ENCOUNTER — Ambulatory Visit: Payer: Medicare Other

## 2023-07-26 ENCOUNTER — Other Ambulatory Visit: Payer: Self-pay

## 2023-07-26 ENCOUNTER — Ambulatory Visit
Admission: RE | Admit: 2023-07-26 | Discharge: 2023-07-26 | Disposition: A | Discharge status: Home / Self Care | Payer: Medicare Other | Source: Ambulatory Visit | Attending: Radiation Oncology | Admitting: Radiation Oncology

## 2023-07-26 ENCOUNTER — Other Ambulatory Visit: Payer: Self-pay | Admitting: Hospice and Palliative Medicine

## 2023-07-26 ENCOUNTER — Encounter: Payer: Self-pay | Admitting: Nurse Practitioner

## 2023-07-26 VITALS — BP 94/54 | HR 91 | Temp 98.9°F | Resp 18 | Ht 67.0 in | Wt 101.0 lb

## 2023-07-26 DIAGNOSIS — Z7189 Other specified counseling: Secondary | ICD-10-CM | POA: Insufficient documentation

## 2023-07-26 DIAGNOSIS — C7951 Secondary malignant neoplasm of bone: Secondary | ICD-10-CM | POA: Insufficient documentation

## 2023-07-26 DIAGNOSIS — R600 Localized edema: Secondary | ICD-10-CM | POA: Insufficient documentation

## 2023-07-26 DIAGNOSIS — R54 Age-related physical debility: Secondary | ICD-10-CM | POA: Insufficient documentation

## 2023-07-26 DIAGNOSIS — G893 Neoplasm related pain (acute) (chronic): Secondary | ICD-10-CM | POA: Insufficient documentation

## 2023-07-26 DIAGNOSIS — Z860100 Personal history of colon polyps, unspecified: Secondary | ICD-10-CM | POA: Insufficient documentation

## 2023-07-26 DIAGNOSIS — Z79899 Other long term (current) drug therapy: Secondary | ICD-10-CM | POA: Insufficient documentation

## 2023-07-26 DIAGNOSIS — E46 Unspecified protein-calorie malnutrition: Secondary | ICD-10-CM | POA: Insufficient documentation

## 2023-07-26 DIAGNOSIS — Z8719 Personal history of other diseases of the digestive system: Secondary | ICD-10-CM | POA: Insufficient documentation

## 2023-07-26 DIAGNOSIS — C3411 Malignant neoplasm of upper lobe, right bronchus or lung: Secondary | ICD-10-CM

## 2023-07-26 DIAGNOSIS — C787 Secondary malignant neoplasm of liver and intrahepatic bile duct: Secondary | ICD-10-CM | POA: Insufficient documentation

## 2023-07-26 DIAGNOSIS — W19XXXA Unspecified fall, initial encounter: Secondary | ICD-10-CM

## 2023-07-26 DIAGNOSIS — Z7984 Long term (current) use of oral hypoglycemic drugs: Secondary | ICD-10-CM | POA: Insufficient documentation

## 2023-07-26 DIAGNOSIS — Z510 Encounter for antineoplastic radiation therapy: Secondary | ICD-10-CM | POA: Diagnosis not present

## 2023-07-26 DIAGNOSIS — K449 Diaphragmatic hernia without obstruction or gangrene: Secondary | ICD-10-CM | POA: Insufficient documentation

## 2023-07-26 DIAGNOSIS — F17210 Nicotine dependence, cigarettes, uncomplicated: Secondary | ICD-10-CM | POA: Insufficient documentation

## 2023-07-26 DIAGNOSIS — M7989 Other specified soft tissue disorders: Secondary | ICD-10-CM | POA: Insufficient documentation

## 2023-07-26 DIAGNOSIS — E7800 Pure hypercholesterolemia, unspecified: Secondary | ICD-10-CM | POA: Insufficient documentation

## 2023-07-26 LAB — RAD ONC ARIA SESSION SUMMARY
Course Elapsed Days: 6
Plan Fractions Treated to Date: 4
Plan Prescribed Dose Per Fraction: 6 Gy
Plan Total Fractions Prescribed: 5
Plan Total Prescribed Dose: 30 Gy
Reference Point Dosage Given to Date: 24 Gy
Reference Point Session Dosage Given: 6 Gy
Session Number: 4

## 2023-07-26 NOTE — Patient Instructions (Signed)
Moderate compression stockings- try knee high but thigh high might be most effective. I've ordered walker and 3 in 1. I'll send the referral to [facility] for hospice and they should be in touch to schedule an appointment to meet with you in your home. Please let me know if you need anything in the meantime.  Leotis Shames, NP

## 2023-07-26 NOTE — Progress Notes (Addendum)
Symptom Management Clinic  [facility] at [facility] A Department of the [place]. [facility] [address] [place], [place] [address] [phone]) [phone] (fax)  Patient Care Team: Earna Coder, MD as PCP - General (Internal Medicine) Glory Buff, RN as Oncology Nurse Navigator   Name of the patient: Stacie Kennedy  [id number]  01/23/1961   Date of visit: 07/26/23  Diagnosis- Stage IV Lung cancer, predominantly large cell neuroendocrine  Chief complaint/ Reason for visit- Worsening weakness, falls, peripheral edema  Heme/Onc history:  Oncology History Overview Note  # NOV -[id number] [facility] CANCER SCREENING PROGRAM]-18 mm right upper lobe lung nodule; DEC 2021- s/p right upper lobectomy [Dr. Dorris Fetch; [place]]; STAGE: I [pT-18 mm; LN-12=0]; predominant large cell neuroendocrine; minor adenocarcinoma.  DeclineD adjuvant chemotherapy.  #Recurrent/stage IV cancer NOV 2022- PET scan-scapular lesion liver lesion gastrohepatic lymphadenopathy.  11/21- start RT to right scapular lesion  # DEC 3rd, 2022- CARBO=ETOP+TECEN; udenyca #1; SEP 2023- STOP Tencetriq- progression  SEP 2023-liver biopsy shows neuroendocrine carcinoma/large cell; no adenocarcinoma noted.  Discontinue Tecentriq any progression of disease.  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   # OCTOBER, 2023-s /p carboplatin etoposide x4 cycles- without immunotherapy [progressed while on Atezo].  CT scan FEB 1st, 2024- Two new solid pulmonary nodules, largest 0.8 cm in the posterior left upper lobe, suspicious for new pulmonary metastases. Two enlarging left liver metastases, including substantial growth of the dominant bulky 9.7 cm segment 3 left liver metastasis. Stable expansile mixed lytic and sclerotic right scapular body metastasis.  # FEB 2024- Lubrinectidin cycle #3-progressive disease-noted; discontinue Lubrinectidin.    # MAY 3rd, 2024- IPI-1+NIVO-3 [KEPASAKE]- cycle #1- CT JUNE 27th-  CT CAP-  Multiple new and enlarged bilateral pulmonary nodules, consistent with worsened pulmonary metastatic disease; Continued interval enlargement of very bulky metastases occupying the left lobe of the liver. Unchanged mixed lytic and sclerotic osseous metastatic lesion of the right scapular body.  DISCONTINUE IPI+ NIVO given the progressive disease.  # JULY 8th, 2024- Gemcitabine x2 ccyles- AUG 14th CT CAP- PROGRESSIVE.   # # MAY 2023- Right swollen eye/orbital inflammation/uveitis s/p Zometa status post steroids improved.-reviewed literature case reports noted; less concerning for tumor involvement.  DISCONTINUE ZOMETA.  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  Visceral Pleura Invasion: Not identified  Direct Invasion of Adjacent Structures: No adjacent structures present  Lymphovascular Invasion: Not identified  Margins: All margins negative for invasive carcinoma       Closest Margin(s) to Invasive Carcinoma: Bronchovascular margin  Treatment Effect: No known presurgical therapy  Regional Lymph Nodes:       Number of Lymph Nodes Involved: 0                            Nodal Sites with Tumor: Not applicable       Number of Lymph Nodes Examined: 12      Primary cancer of right upper lobe of lung (HCC)  11/03/2020 Initial Diagnosis   Primary cancer of right upper lobe of lung (HCC)   11/03/2020 Cancer Staging  Staging form: Lung, AJCC 8th Edition - Pathologic: Stage IA3 (pT1c, pN0, cM0) - Signed by Earna Coder, MD on 11/04/2020   09/14/2021 - 04/30/2022 Chemotherapy    Patient is on Treatment Plan : LUNG SCLC Carboplatin + Etoposide + Atezolizumab Induction q21d / Atezolizumab Maintenance q21d     09/15/2021 - 06/18/2022 Chemotherapy   Patient is on Treatment Plan : LUNG SCLC Carboplatin + Etoposide + Atezolizumab Induction q21d x 4 cycles / Atezolizumab Maintenance q21d     11/09/2021 Cancer Staging   Staging form: Lung, AJCC 8th Edition - Pathologic: Stage IVB (pTX, pNX, cM1c) - Signed by Earna Coder, MD on 11/09/2021   06/22/2022 Pathology Results   Liver biopsy showed metastatic cancer with neuroendocrine features    08/09/2022 - 10/22/2022 Chemotherapy   Patient is on Treatment Plan : BRAIN Carboplatin (AUC 5) D1 + Etoposide (120) D1-3 q28d     11/26/2022 - 01/10/2023 Chemotherapy   Patient is on Treatment Plan : LUNG SMALL CELL Lurbinectedin q21d     02/11/2023 - 03/25/2023 Chemotherapy   Patient is on Treatment Plan : MELANOMA Nivolumab (3) + Ipilimumab (1) q21d / Nivolumab (240) q14d     04/18/2023 - 05/16/2023 Chemotherapy   Patient is on Treatment Plan : LUNG Gemcitabine (1000) D1,8 q21d       Interval history- Patient is 62 year old female diagnosed with stage IV lung cancer status post multiple lines of therapy, currently on treatment holiday due to progressive weakness, declined hospice, who presents to symptom management clinic for complaints of increased bilateral peripheral edema, further loss of appetite, pain, and falls at home.  She is accompanied by her Sister Misty Stanley today.  Symptoms have been gradually worsening over the past several weeks.  Leg swelling is most bothersome.  She would like to continue to stay in her home where she lives with her significant other Judie Petit.  Requests a walker and assistive devices.  ECOG FS:3 - Symptomatic, >50% confined to bed  Review of systems- Review of Systems  Constitutional:  Positive for malaise/fatigue and weight loss.  Respiratory:  Positive for cough, sputum production and shortness of breath.    Cardiovascular:  Positive for leg swelling.  Musculoskeletal:  Positive for falls.  Neurological:  Positive for weakness.     No Known Allergies  Past Medical History:  Diagnosis Date   Cancer (HCC)    lung cancer   Cancer associated pain    Depression    Duodenitis    Dyspnea    Family history of cancer    High cholesterol    Personal history of colonic polyps    Pre-diabetes    Umbilical hernia    UTI (urinary tract infection)     Past Surgical History:  Procedure Laterality Date   COLONOSCOPY WITH PROPOFOL N/A 03/24/2021   Procedure: COLONOSCOPY WITH PROPOFOL;  Surgeon: Wyline Mood, MD;  Location[facility] ENDOSCOPY;  Service: Gastroenterology;  Laterality: N/A;   INTERCOSTAL NERVE BLOCK  09/19/2020   Procedure: INTERCOSTAL NERVE BLOCK;  Surgeon: Loreli Slot, MD;  Location: [facility] OR;  Service: Thoracic;;   IR IMAGING GUIDED PORT INSERTION  09/23/2021   LUNG LOBECTOMY Right    LUNG REMOVAL, PARTIAL Right 09/19/2020   NODE DISSECTION Right 09/19/2020   Procedure: NODE DISSECTION;  Surgeon: Loreli Slot, MD;  Location: [facility] OR;  Service: Thoracic;  Laterality: Right;   VIDEO BRONCHOSCOPY N/A 07/08/2021   Procedure: VIDEO BRONCHOSCOPY;  Surgeon: Loreli Slot, MD;  Location: [facility]  OR;  Service: Thoracic;  Laterality: N/A;    Social History   Socioeconomic History   Marital status: Single    Spouse name: Not on file   Number of children: Not on file   Years of education: Not on file   Highest education level: Not on file  Occupational History   Not on file  Tobacco Use   Smoking status: Some Days    Current packs/day: 0.25    Average packs/day: 0.3 packs/day for 43.0 years (10.8 ttl pk-yrs)    Types: Cigarettes   Smokeless tobacco: Never   Tobacco comments:    states she is slowing, trying to quit  Vaping Use   Vaping status: Never Used  Substance and Sexual Activity   Alcohol use: Yes    Alcohol/week: 2.0 standard drinks of alcohol    Types:  2 Cans of beer per week    Comment: occasional   Drug use: No   Sexual activity: Not on file  Other Topics Concern   Not on file  Social History Narrative   Lives in [place]; smokes; now and then beer; with boy friend. 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    Social Determinants of Health   Financial Resource Strain: High Risk (04/22/2022)   Overall Financial Resource Strain (CARDIA)    Difficulty of Paying Living Expenses: Hard  Food Insecurity: Food Insecurity Present (07/09/2021)   Hunger Vital Sign    Worried About Running Out of Food in the Last Year: Sometimes true    Ran Out of Food in the Last Year: Sometimes true  Transportation Needs: No Transportation Needs (07/09/2021)   PRAPARE - Administrator, Civil Service (Medical): No    Lack of Transportation (Non-Medical): No  Physical Activity: Insufficiently Active (04/16/2021)   Exercise Vital Sign    Days of Exercise per Week: 7 days    Minutes of Exercise per Session: 20 min  Stress: Stress Concern Present (04/22/2022)   [employer] of Occupational Health - Occupational Stress Questionnaire    Feeling of Stress : Very much  Social Connections: Socially Isolated (04/22/2022)   Social Connection and Isolation Panel [NHANES]    Frequency of Communication with Friends and Family: Once a week    Frequency of Social Gatherings with Friends and Family: Once a week    Attends Religious Services: Never    Database administrator or Organizations: No    Attends Banker Meetings: Never    Marital Status: Living with partner  Intimate Partner Violence: At Risk (04/22/2022)   Humiliation, Afraid, Rape, and Kick questionnaire    Fear of Current or Ex-Partner: No    Emotionally Abused: Yes    Physically Abused: No    Sexually Abused: No    Family History  Problem Relation Age of Onset   Diabetes Mother    Cancer Mother    Diabetes Father    Stroke Father    Heart attack Father         64s   Healthy Sister    Cancer Brother    Hepatitis Brother    Diabetes Brother    Hypertension Brother    Heart disease Brother      Current Outpatient Medications:    albuterol (PROVENTIL HFA) 108 (90 Base) MCG/ACT inhaler, Inhale 2 puffs into the lungs once every 6 (six) hours as needed for wheezing or shortness of breath., Disp: 18 g, Rfl: 2   DULoxetine (CYMBALTA) 30 MG capsule, Take  1 capsule (30 mg total) by mouth daily. For nausea, appetite, sleep, Disp: 30 capsule, Rfl: 3   lidocaine-prilocaine (EMLA) cream, Apply one application topically the the affected area(s) daily as needed., Disp: 30 g, Rfl: 3   loratadine (CLARITIN) 10 MG tablet, Take 1 tablet (10 mg total) by mouth daily., Disp: 30 tablet, Rfl: 2   metFORMIN (GLUCOPHAGE) 500 MG tablet, Take 1 tablet (500 mg total) by mouth daily with breakfast., Disp: 90 tablet, Rfl: 3   OLANZapine (ZYPREXA) 5 MG tablet, Take 1 tablet (5 mg total) by mouth at bedtime., Disp: 30 tablet, Rfl: 3   ondansetron (ZOFRAN) 4 MG tablet, TAKE 2 TABLETS (8MG ) BY MOUTH ONCE EVERY 8 HOURS AS NEEDED FOR NAUSEA OR VOMITING., Disp: 80 tablet, Rfl: 1   oxyCODONE (OXYCONTIN) 60 MG 12 hr tablet, Take 1 tablet by mouth every 12 (twelve) hours., Disp: 60 tablet, Rfl: 0   oxyCODONE (ROXICODONE) 15 MG immediate release tablet, Take 1-2 tablets (15-30 mg total) by mouth every 4 (four) hours as needed for pain., Disp: 90 tablet, Rfl: 0   prochlorperazine (COMPAZINE) 10 MG tablet, Take 1 tablet (10 mg total) by mouth once every 6 (six) hours as needed for nausea or vomiting., Disp: 40 tablet, Rfl: 1   psyllium (METAMUCIL) 58.6 % packet, Take 1 packet by mouth daily., Disp: , Rfl:    naloxone (NARCAN) nasal spray 4 mg/0.1 mL, SPRAY 1 SPRAY INTO ONE NOSTRIL AS DIRECTED FOR OPIOID OVERDOSE (TURN PERSON ON SIDE AFTER DOSE. IF NO RESPONSE IN 2-3 MINUTES OR PERSON RESPONDS BUT RELAPSES, REPEAT USING A NEW SPRAY DEVICE AND SPRAY INTO THE OTHER NOSTRIL. CALL 911 AFTER USE.) *  EMERGENCY USE ONLY * (Patient not taking: Reported on 07/26/2023), Disp: 2 each, Rfl: 0 No current facility-administered medications for this visit.  Facility-Administered Medications Ordered in Other Visits:    heparin lock flush 100 UNIT/ML injection, , , ,    heparin lock flush 100 unit/mL, 500 Units, Intravenous, Once, Brahmanday, Govinda R, MD   morphine (PF) 2 MG/ML injection, , , ,    sodium chloride flush (NS) 0.9 % injection 10 mL, 10 mL, Intravenous, PRN, Earna Coder, MD, 10 mL at 03/01/22 0857  Physical exam:  Vitals:   07/26/23 1515 07/26/23 1523  BP: (!) 94/57 (!) 94/54  Pulse: 93 91  Resp: 18   Temp: 98.9 F (37.2 C)   TempSrc: Tympanic   SpO2: 100%   Weight: 101 lb (45.8 kg)   Height: 5\' 7"  (1.702 m)    Physical Exam Constitutional:      Comments: Cachectic. Temporal wasting. Frail appearing. Accompanied by sister. In wheelchair.   Pulmonary:     Comments: cough Musculoskeletal:     Right lower leg: Edema present.     Left lower leg: Edema present.  Skin:    Coloration: Skin is pale.  Neurological:     Mental Status: She is oriented to person, place, and time.  Psychiatric:        Mood and Affect: Mood normal.     No results found.  Assessment and plan- Patient is a 62 y.o. female   NSCLC- Non-small cell lung cancer-[mixed-predominant large cell neuroendocrine; adenocarcinoma];-SEP 2023-liver biopsy shows neuroendocrine carcinoma/large cell; no adenocarcinoma noted.  Progression noted on currently s/p  gemcitabine chemotherapy- 2 cylecs- CT AUG 14th, 2024- Mildly progressive pulmonary metastasis;  Right scapular osseous metastasis is slightly smaller today. No new osseous metastasis identified.  Mildly progressive hepatic metastasis. Progressive disease, lack  of effective chemo options, supportive care was recommended.  Metastatic disease to bone and mets to scalp. - Increased nodularity clinically which is likely malignancy. . Peripheral edema-  d/t decreased mobility, malnutrition, etc. Unlikely to improve. Diuresis contraindicated and would likely worsen her clinical condition. Recommend conservative measures such as elevation and compression. Usefulness guarded however.  Falls- due to increased frailty and weakness d/t progressive malignancy. She is able to ambulate short distances with use of a walker and I recommend a rolling walker with seat to help maintain her independence within her home. Also recommend 3 in 1 for BSC, raised toilet seat, and shower chair.  Cancer related pain- currently controlled per patient Weight loss- and loss of appetite d/t progressive malignancy.  Goals of care- previously declined hospice. Has been followed by outpatient palliative care. Today she agrees to meet with hospice to consider services. Also has f/u scheduled with Kathyrn Drown, NP for [facility] later this week.   Disposition:  Ref to hospice Follow up with Palliative care as scheduled RTC as needed - la   Visit Diagnosis 1. Primary cancer of right upper lobe of lung (HCC)   2. Bilateral lower extremity edema   3. Goals of care, counseling/discussion   4. Fall, initial encounter   5. Frailty     Patient expressed understanding and was in agreement with this plan. She also understands that She can call clinic at any time with any questions, concerns, or complaints.   Thank you for allowing me to participate in the care of this very pleasant patient.   Consuello Masse, DNP, AGNP-C, AOCNP [facility] [phone]  CC: Dr Donneta Romberg & Elouise Munroe, NP

## 2023-07-27 ENCOUNTER — Other Ambulatory Visit: Payer: Self-pay

## 2023-07-27 ENCOUNTER — Ambulatory Visit
Admission: RE | Admit: 2023-07-27 | Discharge: 2023-07-27 | Disposition: A | Discharge status: Home / Self Care | Payer: Medicare Other | Source: Ambulatory Visit | Attending: Radiation Oncology | Admitting: Radiation Oncology

## 2023-07-27 ENCOUNTER — Other Ambulatory Visit: Payer: Self-pay | Admitting: *Deleted

## 2023-07-27 ENCOUNTER — Ambulatory Visit: Payer: Medicare Other

## 2023-07-27 DIAGNOSIS — R0602 Shortness of breath: Secondary | ICD-10-CM

## 2023-07-27 DIAGNOSIS — Z510 Encounter for antineoplastic radiation therapy: Secondary | ICD-10-CM | POA: Diagnosis not present

## 2023-07-27 LAB — RAD ONC ARIA SESSION SUMMARY
Course Elapsed Days: 7
Plan Fractions Treated to Date: 5
Plan Prescribed Dose Per Fraction: 6 Gy
Plan Total Fractions Prescribed: 5
Plan Total Prescribed Dose: 30 Gy
Reference Point Dosage Given to Date: 30 Gy
Reference Point Session Dosage Given: 6 Gy
Session Number: 5

## 2023-07-27 MED ORDER — ALBUTEROL SULFATE HFA 108 (90 BASE) MCG/ACT IN AERS
2.0000 | INHALATION_SPRAY | Freq: Four times a day (QID) | RESPIRATORY_TRACT | 2 refills | Status: DC | PRN
  Filled 2023-07-27: qty 18, 25d supply, fill #0

## 2023-07-27 MED ORDER — OXYCODONE HCL 15 MG PO TABS
15.0000 mg | ORAL_TABLET | ORAL | 0 refills | Status: DC | PRN
  Filled 2023-07-27: qty 90, 15d supply, fill #0

## 2023-07-28 ENCOUNTER — Other Ambulatory Visit: Payer: Self-pay | Admitting: Hospice and Palliative Medicine

## 2023-07-28 ENCOUNTER — Other Ambulatory Visit: Payer: Self-pay

## 2023-07-29 ENCOUNTER — Other Ambulatory Visit: Payer: Self-pay

## 2023-07-29 ENCOUNTER — Encounter: Payer: Self-pay | Admitting: Internal Medicine

## 2023-07-29 ENCOUNTER — Inpatient Hospital Stay: Payer: Medicare Other | Admitting: Internal Medicine

## 2023-07-29 ENCOUNTER — Inpatient Hospital Stay (HOSPITAL_BASED_OUTPATIENT_CLINIC_OR_DEPARTMENT_OTHER): Payer: Medicare Other | Admitting: Hospice and Palliative Medicine

## 2023-07-29 ENCOUNTER — Inpatient Hospital Stay: Payer: Medicare Other

## 2023-07-29 DIAGNOSIS — Z515 Encounter for palliative care: Secondary | ICD-10-CM | POA: Diagnosis not present

## 2023-07-29 DIAGNOSIS — C3411 Malignant neoplasm of upper lobe, right bronchus or lung: Secondary | ICD-10-CM

## 2023-07-29 DIAGNOSIS — G893 Neoplasm related pain (acute) (chronic): Secondary | ICD-10-CM | POA: Diagnosis not present

## 2023-07-29 DIAGNOSIS — C3490 Malignant neoplasm of unspecified part of unspecified bronchus or lung: Secondary | ICD-10-CM | POA: Diagnosis not present

## 2023-07-29 MED ORDER — ALBUTEROL SULFATE (2.5 MG/3ML) 0.083% IN NEBU
3.0000 mL | INHALATION_SOLUTION | Freq: Four times a day (QID) | RESPIRATORY_TRACT | 0 refills | Status: DC | PRN
  Filled 2023-07-29: qty 180, 15d supply, fill #0

## 2023-07-29 MED ORDER — SENNOSIDES 8.6 MG PO TABS
1.0000 | ORAL_TABLET | Freq: Every day | ORAL | 0 refills | Status: DC
  Filled 2023-07-29: qty 30, 30d supply, fill #0

## 2023-07-29 NOTE — Progress Notes (Signed)
Virtual Visit via Telephone Note  I connected with Stacie Kennedy on 07/29/23 at  2:00 PM EDT by telephone and verified that I am speaking with the correct person using two identifiers.  Location: Patient: Home Provider: Clinic   I discussed the limitations, risks, security and privacy concerns of performing an evaluation and management service by telephone and the availability of in person appointments. I also discussed with the patient that there may be a patient responsible charge related to this service. The patient expressed understanding and agreed to proceed.   History of Present Illness: Stacie Kennedy is a 62 y.o. 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  Patient has had severe pain with scapular metastases and was started on XRT.  She is referred to palliative care to help address goals and manage ongoing symptoms.    Observations/Objective: Patient seen in [facility] on 07/26/2023 with overall decline secondary to progressive metastatic disease.  Patient was referred to hospice.  Patient was supposed to have follow-up in clinic today to see Dr. Donneta Romberg and me.  However, I received a call today from hospice RN who was in the home.  I had a conversation with patient, sister, and RN to discuss overall goals.  Discussed her progressive cancer with limited life expectancy.  Recommended comfort measures at home and continued hospice involvement. Strongly recommended DNR/DNI but the patient stated that she does not "want to talk about it."  Discussed symptom management with RN.  Assessment and Plan: Stage IV NSCLC -best supportive care on hospice.  Recommend DNR/DNI  Neoplasm related pain -continue OxyContin/oxycodone  Follow Up Instructions: As needed   I discussed the assessment and  treatment plan with the patient. The patient was provided an opportunity to ask questions and all were answered. The patient agreed with the plan and demonstrated an understanding of the instructions.   The patient was advised to call back or seek an in-person evaluation if the symptoms worsen or if the condition fails to improve as anticipated.  I provided 15 minutes of non-face-to-face time during this encounter.   Malachy Moan, NP

## 2023-08-01 ENCOUNTER — Other Ambulatory Visit: Payer: Self-pay

## 2023-08-05 ENCOUNTER — Other Ambulatory Visit: Payer: Self-pay | Admitting: *Deleted

## 2023-08-05 MED ORDER — OXYCONTIN 60 MG PO T12A: 1.0000 | EXTENDED_RELEASE_TABLET | Freq: Two times a day (BID) | ORAL | 0 refills | Status: DC

## 2023-08-05 MED ORDER — OXYCODONE HCL 15 MG PO TABS: 15.0000 mg | ORAL_TABLET | ORAL | 0 refills | Status: DC | PRN

## 2023-08-23 ENCOUNTER — Inpatient Hospital Stay
Admission: EM | Admit: 2023-08-23 | Discharge: 2023-09-11 | DRG: 843 | Disposition: E | Attending: Internal Medicine | Admitting: Internal Medicine

## 2023-08-23 ENCOUNTER — Encounter: Payer: Self-pay | Admitting: Internal Medicine

## 2023-08-23 ENCOUNTER — Emergency Department

## 2023-08-23 ENCOUNTER — Other Ambulatory Visit: Payer: Self-pay

## 2023-08-23 DIAGNOSIS — R64 Cachexia: Secondary | ICD-10-CM | POA: Diagnosis present

## 2023-08-23 DIAGNOSIS — C3490 Malignant neoplasm of unspecified part of unspecified bronchus or lung: Secondary | ICD-10-CM

## 2023-08-23 DIAGNOSIS — E860 Dehydration: Secondary | ICD-10-CM | POA: Diagnosis present

## 2023-08-23 DIAGNOSIS — R627 Adult failure to thrive: Secondary | ICD-10-CM | POA: Diagnosis present

## 2023-08-23 DIAGNOSIS — C7951 Secondary malignant neoplasm of bone: Secondary | ICD-10-CM | POA: Diagnosis not present

## 2023-08-23 DIAGNOSIS — Z79891 Long term (current) use of opiate analgesic: Secondary | ICD-10-CM

## 2023-08-23 DIAGNOSIS — J9621 Acute and chronic respiratory failure with hypoxia: Secondary | ICD-10-CM | POA: Diagnosis present

## 2023-08-23 DIAGNOSIS — Z8249 Family history of ischemic heart disease and other diseases of the circulatory system: Secondary | ICD-10-CM

## 2023-08-23 DIAGNOSIS — R791 Abnormal coagulation profile: Secondary | ICD-10-CM | POA: Diagnosis present

## 2023-08-23 DIAGNOSIS — Z91198 Patient's noncompliance with other medical treatment and regimen for other reason: Secondary | ICD-10-CM

## 2023-08-23 DIAGNOSIS — D630 Anemia in neoplastic disease: Secondary | ICD-10-CM | POA: Diagnosis present

## 2023-08-23 DIAGNOSIS — I959 Hypotension, unspecified: Secondary | ICD-10-CM | POA: Diagnosis present

## 2023-08-23 DIAGNOSIS — Z9981 Dependence on supplemental oxygen: Secondary | ICD-10-CM

## 2023-08-23 DIAGNOSIS — G893 Neoplasm related pain (acute) (chronic): Secondary | ICD-10-CM | POA: Diagnosis present

## 2023-08-23 DIAGNOSIS — E7800 Pure hypercholesterolemia, unspecified: Secondary | ICD-10-CM | POA: Diagnosis present

## 2023-08-23 DIAGNOSIS — Z85118 Personal history of other malignant neoplasm of bronchus and lung: Secondary | ICD-10-CM

## 2023-08-23 DIAGNOSIS — Z9221 Personal history of antineoplastic chemotherapy: Secondary | ICD-10-CM

## 2023-08-23 DIAGNOSIS — C799 Secondary malignant neoplasm of unspecified site: Secondary | ICD-10-CM

## 2023-08-23 DIAGNOSIS — E43 Unspecified severe protein-calorie malnutrition: Secondary | ICD-10-CM | POA: Diagnosis present

## 2023-08-23 DIAGNOSIS — Z1152 Encounter for screening for COVID-19: Secondary | ICD-10-CM

## 2023-08-23 DIAGNOSIS — E871 Hypo-osmolality and hyponatremia: Secondary | ICD-10-CM | POA: Diagnosis present

## 2023-08-23 DIAGNOSIS — R7401 Elevation of levels of liver transaminase levels: Secondary | ICD-10-CM | POA: Diagnosis present

## 2023-08-23 DIAGNOSIS — R531 Weakness: Secondary | ICD-10-CM | POA: Diagnosis present

## 2023-08-23 DIAGNOSIS — C7B8 Other secondary neuroendocrine tumors: Secondary | ICD-10-CM | POA: Diagnosis present

## 2023-08-23 DIAGNOSIS — Z7984 Long term (current) use of oral hypoglycemic drugs: Secondary | ICD-10-CM

## 2023-08-23 DIAGNOSIS — L89150 Pressure ulcer of sacral region, unstageable: Secondary | ICD-10-CM | POA: Diagnosis present

## 2023-08-23 DIAGNOSIS — F32A Depression, unspecified: Secondary | ICD-10-CM | POA: Diagnosis present

## 2023-08-23 DIAGNOSIS — Z85820 Personal history of malignant melanoma of skin: Secondary | ICD-10-CM

## 2023-08-23 DIAGNOSIS — Z923 Personal history of irradiation: Secondary | ICD-10-CM

## 2023-08-23 DIAGNOSIS — Z860100 Personal history of colon polyps, unspecified: Secondary | ICD-10-CM

## 2023-08-23 DIAGNOSIS — Z66 Do not resuscitate: Secondary | ICD-10-CM | POA: Diagnosis not present

## 2023-08-23 DIAGNOSIS — Z902 Acquired absence of lung [part of]: Secondary | ICD-10-CM

## 2023-08-23 DIAGNOSIS — N179 Acute kidney failure, unspecified: Secondary | ICD-10-CM | POA: Diagnosis present

## 2023-08-23 DIAGNOSIS — Z79899 Other long term (current) drug therapy: Secondary | ICD-10-CM

## 2023-08-23 DIAGNOSIS — E162 Hypoglycemia, unspecified: Secondary | ICD-10-CM | POA: Diagnosis not present

## 2023-08-23 DIAGNOSIS — F17210 Nicotine dependence, cigarettes, uncomplicated: Secondary | ICD-10-CM | POA: Diagnosis present

## 2023-08-23 DIAGNOSIS — Z833 Family history of diabetes mellitus: Secondary | ICD-10-CM

## 2023-08-23 DIAGNOSIS — Z716 Tobacco abuse counseling: Secondary | ICD-10-CM

## 2023-08-23 DIAGNOSIS — Z6821 Body mass index (BMI) 21.0-21.9, adult: Secondary | ICD-10-CM

## 2023-08-23 DIAGNOSIS — Z515 Encounter for palliative care: Secondary | ICD-10-CM

## 2023-08-23 DIAGNOSIS — I2693 Single subsegmental pulmonary embolism without acute cor pulmonale: Secondary | ICD-10-CM | POA: Diagnosis present

## 2023-08-23 DIAGNOSIS — R7303 Prediabetes: Secondary | ICD-10-CM | POA: Diagnosis present

## 2023-08-23 DIAGNOSIS — I2699 Other pulmonary embolism without acute cor pulmonale: Secondary | ICD-10-CM | POA: Diagnosis not present

## 2023-08-23 LAB — CBC WITH DIFFERENTIAL/PLATELET
Abs Immature Granulocytes: 0.06 10*3/uL (ref 0.00–0.07)
Basophils Absolute: 0 10*3/uL (ref 0.0–0.1)
Basophils Relative: 0 %
Eosinophils Absolute: 0 10*3/uL (ref 0.0–0.5)
Eosinophils Relative: 0 %
HCT: 27.9 % — ABNORMAL LOW (ref 36.0–46.0)
Hemoglobin: 9.5 g/dL — ABNORMAL LOW (ref 12.0–15.0)
Immature Granulocytes: 1 %
Lymphocytes Relative: 6 %
Lymphs Abs: 0.5 10*3/uL — ABNORMAL LOW (ref 0.7–4.0)
MCH: 29.7 pg (ref 26.0–34.0)
MCHC: 34.1 g/dL (ref 30.0–36.0)
MCV: 87.2 fL (ref 80.0–100.0)
Monocytes Absolute: 0.6 10*3/uL (ref 0.1–1.0)
Monocytes Relative: 7 %
Neutro Abs: 7.4 10*3/uL (ref 1.7–7.7)
Neutrophils Relative %: 86 %
Platelets: 242 10*3/uL (ref 150–400)
RBC: 3.2 MIL/uL — ABNORMAL LOW (ref 3.87–5.11)
RDW: 18.8 % — ABNORMAL HIGH (ref 11.5–15.5)
WBC: 8.6 10*3/uL (ref 4.0–10.5)
nRBC: 0.2 % (ref 0.0–0.2)

## 2023-08-23 LAB — COMPREHENSIVE METABOLIC PANEL
ALT: 31 U/L (ref 0–44)
AST: 96 U/L — ABNORMAL HIGH (ref 15–41)
Albumin: 2.6 g/dL — ABNORMAL LOW (ref 3.5–5.0)
Alkaline Phosphatase: 162 U/L — ABNORMAL HIGH (ref 38–126)
Anion gap: 15 (ref 5–15)
BUN: 34 mg/dL — ABNORMAL HIGH (ref 8–23)
CO2: 24 mmol/L (ref 22–32)
Calcium: 7.8 mg/dL — ABNORMAL LOW (ref 8.9–10.3)
Chloride: 90 mmol/L — ABNORMAL LOW (ref 98–111)
Creatinine, Ser: 1.32 mg/dL — ABNORMAL HIGH (ref 0.44–1.00)
GFR, Estimated: 46 mL/min — ABNORMAL LOW (ref >60)
Glucose, Bld: 62 mg/dL — ABNORMAL LOW (ref 70–99)
Potassium: 4.6 mmol/L (ref 3.5–5.1)
Sodium: 129 mmol/L — ABNORMAL LOW (ref 135–145)
Total Bilirubin: 1.6 mg/dL — ABNORMAL HIGH (ref <1.2)
Total Protein: 6.4 g/dL — ABNORMAL LOW (ref 6.5–8.1)

## 2023-08-23 LAB — PROTIME-INR
INR: 1.6 — ABNORMAL HIGH (ref 0.8–1.2)
Prothrombin Time: 18.8 s — ABNORMAL HIGH (ref 11.4–15.2)

## 2023-08-23 LAB — GLUCOSE, CAPILLARY
Glucose-Capillary: 69 mg/dL — ABNORMAL LOW (ref 70–99)
Glucose-Capillary: 95 mg/dL (ref 70–99)

## 2023-08-23 LAB — TROPONIN I (HIGH SENSITIVITY): Troponin I (High Sensitivity): 18 ng/L — ABNORMAL HIGH (ref <18)

## 2023-08-23 LAB — BRAIN NATRIURETIC PEPTIDE: B Natriuretic Peptide: 281.3 pg/mL — ABNORMAL HIGH (ref 0.0–100.0)

## 2023-08-23 LAB — LACTIC ACID, PLASMA: Lactic Acid, Venous: 2 mmol/L (ref 0.5–1.9)

## 2023-08-23 LAB — PHOSPHORUS: Phosphorus: 3.5 mg/dL (ref 2.5–4.6)

## 2023-08-23 LAB — MAGNESIUM: Magnesium: 1.4 mg/dL — ABNORMAL LOW (ref 1.7–2.4)

## 2023-08-23 MED ORDER — DOCUSATE SODIUM 100 MG PO CAPS: 100.0000 mg | ORAL_CAPSULE | Freq: Two times a day (BID) | ORAL | Status: DC | PRN

## 2023-08-23 MED ORDER — NOREPINEPHRINE 4 MG/250ML-% IV SOLN
0.0000 ug/min | INTRAVENOUS | Status: DC
  Administered 2023-08-23: 2 ug/min via INTRAVENOUS
  Administered 2023-08-24 (×2): 10 ug/min via INTRAVENOUS
  Filled 2023-08-23 (×2): qty 250

## 2023-08-23 MED ORDER — LACTATED RINGERS IV SOLN: INTRAVENOUS | Status: DC

## 2023-08-23 MED ORDER — PANTOPRAZOLE SODIUM 40 MG IV SOLR
40.0000 mg | INTRAVENOUS | Status: DC
  Administered 2023-08-23: 40 mg via INTRAVENOUS
  Filled 2023-08-23: qty 10

## 2023-08-23 MED ORDER — HEPARIN (PORCINE) 25000 UT/250ML-% IV SOLN: 800.0000 [IU]/h | INTRAVENOUS | Status: DC

## 2023-08-23 MED ORDER — SODIUM CHLORIDE 0.9% FLUSH: 10.0000 mL | INTRAVENOUS | Status: DC | PRN

## 2023-08-23 MED ORDER — CHLORHEXIDINE GLUCONATE CLOTH 2 % EX PADS
6.0000 | MEDICATED_PAD | Freq: Every day | CUTANEOUS | Status: DC
  Administered 2023-08-24: 6 via TOPICAL

## 2023-08-23 MED ORDER — FENTANYL CITRATE PF 50 MCG/ML IJ SOSY
50.0000 ug | PREFILLED_SYRINGE | Freq: Once | INTRAMUSCULAR | Status: AC | PRN
  Administered 2023-08-23: 50 ug via INTRAVENOUS
  Filled 2023-08-23: qty 1

## 2023-08-23 MED ORDER — FENTANYL CITRATE PF 50 MCG/ML IJ SOSY
50.0000 ug | PREFILLED_SYRINGE | INTRAMUSCULAR | Status: DC | PRN
  Administered 2023-08-24 (×2): 50 ug via INTRAVENOUS
  Filled 2023-08-23 (×2): qty 1

## 2023-08-23 MED ORDER — SODIUM CHLORIDE 0.9 % IV BOLUS
1000.0000 mL | Freq: Once | INTRAVENOUS | Status: AC
  Administered 2023-08-23: 1000 mL via INTRAVENOUS

## 2023-08-23 MED ORDER — DEXTROSE 10 % IV BOLUS
250.0000 mL | Freq: Once | INTRAVENOUS | Status: AC
  Administered 2023-08-23: 250 mL via INTRAVENOUS
  Filled 2023-08-23: qty 500

## 2023-08-23 MED ORDER — LIDOCAINE 5 % EX PTCH
2.0000 | MEDICATED_PATCH | CUTANEOUS | Status: DC
  Administered 2023-08-23 – 2023-08-26 (×4): 2 via TRANSDERMAL
  Filled 2023-08-23 (×6): qty 2

## 2023-08-23 MED ORDER — PANTOPRAZOLE SODIUM 40 MG PO TBEC: 40.0000 mg | DELAYED_RELEASE_TABLET | Freq: Every day | ORAL | Status: DC

## 2023-08-23 MED ORDER — FENTANYL CITRATE PF 50 MCG/ML IJ SOSY
50.0000 ug | PREFILLED_SYRINGE | Freq: Once | INTRAMUSCULAR | Status: AC
  Administered 2023-08-23: 50 ug via INTRAVENOUS
  Filled 2023-08-23: qty 1

## 2023-08-23 MED ORDER — HEPARIN BOLUS VIA INFUSION
2800.0000 [IU] | Freq: Once | INTRAVENOUS | Status: DC
  Filled 2023-08-23: qty 2800

## 2023-08-23 MED ORDER — ENOXAPARIN SODIUM 40 MG/0.4ML IJ SOSY: 40.0000 mg | PREFILLED_SYRINGE | INTRAMUSCULAR | Status: DC

## 2023-08-23 MED ORDER — IOHEXOL 350 MG/ML SOLN
100.0000 mL | Freq: Once | INTRAVENOUS | Status: AC | PRN
  Administered 2023-08-23: 100 mL via INTRAVENOUS

## 2023-08-23 MED ORDER — ENOXAPARIN SODIUM 30 MG/0.3ML IJ SOSY
30.0000 mg | PREFILLED_SYRINGE | INTRAMUSCULAR | Status: DC
Start: 2023-08-24 — End: 2023-08-24
  Administered 2023-08-23: 30 mg via SUBCUTANEOUS
  Filled 2023-08-23: qty 0.3

## 2023-08-23 MED ORDER — SODIUM CHLORIDE 0.9 % IV SOLN: INTRAVENOUS | Status: DC

## 2023-08-23 MED ORDER — SODIUM CHLORIDE 0.9% FLUSH
10.0000 mL | Freq: Two times a day (BID) | INTRAVENOUS | Status: DC
  Administered 2023-08-24 – 2023-08-27 (×6): 10 mL

## 2023-08-23 MED ORDER — POLYETHYLENE GLYCOL 3350 17 G PO PACK: 17.0000 g | PACK | Freq: Every day | ORAL | Status: DC | PRN

## 2023-08-23 MED ORDER — OXYCODONE HCL 5 MG PO TABS: 5.0000 mg | ORAL_TABLET | Freq: Once | ORAL | Status: DC

## 2023-08-23 NOTE — ED Notes (Signed)
Pt at ct

## 2023-08-23 NOTE — H&P (Signed)
NAME:  Stacie Kennedy, MRN:  [id number], DOB:  06/12/1961, LOS: 0 ADMISSION DATE:  08/23/2023, CONSULTATION DATE:  08/23/23 REFERRING MD:  Dr. Rosalia Hammers, CHIEF COMPLAINT:  Leg swelling   History of Present Illness:  62 yo F presenting to [facility] ED via EMS from home on 08/23/23 for evaluation of leg swelling and back pain from a sacral wound.  History obtained per chart review, patient bedside report and conversation with sister and niece. 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  Per oncology notation patient's initial diagnosis of primary cancer of the RUL in January 2022 s/p lobectomy.  She is post multiple rounds of chemotherapy.  In November 2022 PET scan revealed right scapular lesion status post radiation therapy. Liver biopsy in September 2023 revealed metastatic cancer with neuroendocrine features.  This was followed by four other runs of chemotherapy ending in August 2024.  Two new brain lesions were found in 04/2023. She was referred to palliative care and more recently hospice.  Patient lives at home with a "friend"/significant other, her sister niece and son are all involved in her care.  Patient interactive with persistence during bedside interview, but only with brief answers and not interactive during goals of care conversation.  Sister and niece describe that she appears to have been declining over the last month but more significantly over the last week.  The patient has had exertional dyspnea with increasing weakness and has been less able to get up and move around regularly over the last 3 days.  They have also noticed increasing BLE edema.  They confirm complaints of abdominal/back and buttocks pain. They report the patient has not been eating the past two days, even though the patient reports eating "some cereal yesterday". They deny recent falls, chest pain, fever/ chills, vomiting/ diarrhea. She has a 10.8 pack year history and is still  actively smoking. Family denies any recent ETOH use, and denies recreational drug use. Most recent documentation from oncology has the patient prescribed OxyContin 60 mg BID and Oxycodone IR 15 mg 4 times daily. Her sister states she is no longer taking the 60 mg BID only 15 mg oxycodone 4 times a day. She does not wear oxygen at home.  ED course: Upon arrival, patient with flat affect, but responsive with soft BP and hypoxia requiring 5 L [place] support. Labs significant for hypoglycemia, AKI, slightly improved Alk phos, but elevated T. Bili & ALT, BNP & INR also elevated with mild anemia close to baseline. Imaging concerning for enlarging brain masses, 1 mass has doubled in size since MRI in July 2024 and second mass has quadrupled in size with mass effect and right temporal lobe.  CT angio revealed small segmental and subsegmental pulmonary emboli, with progression of metastatic disease noted throughout CT abdomen and pelvis. BP has remained soft with SBP in the 80's while MAP's >/= 65. Medications given: fentanyl, D10W bolus, 1 L NS bolus, IV contrast Initial Vitals: 99, 19, 84, 90/60 & 90% on 2 L [place] Significant labs: (Labs/ Imaging personally reviewed) I, Cheryll Cockayne Rust-Chester, AGACNP-BC, personally viewed and interpreted this ECG. EKG Interpretation: Date: 08/23/2023, EKG Time: 14:30, Rate: 89, Rhythm: NSR, QRS Axis: Normal, Intervals: LAE & probable LVH, ST/T Wave abnormalities: None, Narrative Interpretation: NSR with probable LVH and LAE Chemistry: Na+:129, K+: 4.6, BUN/Cr.: 34/1.32, Serum CO2/ AG: 24/15, Cl: 90, Alk phos: 162, albumin: 2.6, AST/ALT: 96/31, T.Bili: 1.6 Hematology: WBC: 8.6, Hgb: 9.5,  Troponin: Pending, BNP:  281.3, Lactic/ PCT: Pending, COVID-19: pending  CXR 08/23/23: emphysema with numerous bilateral pulmonary masses consistent with metastatic disease, appears increased compared with CT from august CT head wo contrast 08/23/23: Marked enlargement of a metastatic lesion  affecting the calvarium at the lateral margin of the middle cranial fossa on the right. Previously, this lesion measured up to 2 cm in thickness, with both external and internal soft tissue component. Now, the thickness is up to 4.3 cm. This bulges much more into the lateral aspect of the middle cranial fossa and has some mass-effect upon the right temporal lobe. Second bone lesion previously seen in the occiput put has grown extensively. In July, this only measured a cm in size. Presently, the infiltrating bone component measures up to 4.4 cm, with tumor bulging external to the calvarium and inward towards the brain. There could be involvement of the superior sagittal sinus. Maximal thickness of tumor is 2.5 cm. No third calvarial lesion is seen. 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 CT angio PE 08/23/23: Small nonocclusive right segmental and subsegmental pulmonary emboli in this patient is status post right upper lobectomy. Progression of pulmonary metastatic disease. Right scapular metastatic lesion with involvement of adjacent muscle is again noted. CT abdomen/pelvis w contrast 08/23/23: Progression of hepatic metastatic disease. Interim development or enlargement of multiple right renal and perirenal/retroperitoneal masses consistent with metastatic disease. Increased lobulated mass in the left gluteal subcutaneous soft tissues consistent with metastatic lesion. Increased size and heterogeneity of previously noted left adrenal adenoma, now contiguous with solid enhancing mass and concerning for metastatic disease. Ill-defined bilateral foci of hypoenhancement within both kidneys on delayed views, question pyelonephritis versus metastatic disease. Extensive subcutaneous edema consistent with anasarca. Incompletely visualized low-density appearance of left abductor muscles which could be due to nonspecific muscle edema or inflammatory process. Aortic atherosclerosis.  PCCM consulted  for admission due to hypotension and small pulmonary emboli with probable need for vasopressor support in the setting of extensive metastatic disease with high risk for decline.  Pertinent  Medical History  Stage IV NSCLC with mets Depression HLD Pre-diabetes Current everyday smoker Significant Hospital Events: Including procedures, antibiotic start and stop dates in addition to other pertinent events   08/23/23: Admit to ICU with metastatic NSCLC and hypotension in the setting of small PE with high risk of decline.  Interim History / Subjective:  Patient RASS -1, responsive and appropriate with persistence.  Patient purposefully not interactive during goals of care conversation.  Objective   Blood pressure (!) 91/59, pulse 88, temperature 98.2 F (36.8 C), temperature source Oral, resp. rate 18, weight 47 kg, SpO2 95%.       No intake or output data in the 24 hours ending 08/23/23 2116 Filed Weights   08/23/23 2028  Weight: 47 kg    Examination: General: Adult female, critically ill, lying in bed, NAD HEENT: MM pink/moist, anicteric, atraumatic, neck supple Neuro: RASS -1, A&O x 4, able to follow commands, PERRL +3, MAE -with bilateral lower extremity weakness CV: s1s2 RRR, NSR on monitor, no r/m/g Pulm: Regular, non labored on 4-5L [place], breath sounds coarse rhonchi-BUL & diminished-BLL GI: soft, distended, non tender to palpation, bs x 4 Skin: sacral pressure injury noted on admission  Extremities: warm/dry, pulses + 2 R/P, +3 edema noted BLE  Resolved Hospital Problem list     Assessment & Plan:  Acute Hypoxic Respiratory Failure suspect multifocal secondary to small RIGHT segmental & subsegmental pulmonary emboli in the setting of Metastatic  NSCLC Current Everyday smoker Small RIGHT segmental and subsegmental pulmonary emboli - Supplemental O2 to maintain SpO2 > 90%, initiate HHFNC PRN -Echocardiogram ordered -Trend troponin -Will hold systemic anticoagulation at  this time due to to enlarging brain masses with high risk for hemorrhage - Intermittent chest x-ray & ABG PRN - Ensure adequate pulmonary hygiene  - Covid test, trend PCT - consider empiric antibiotics depending on PCT trend - Duo nebs TID, bronchodilators PRN - SLP consulted to evaluate for aspiration risk - smoking cessation counseling when appropriate  Hypotension suspect multifocal secondary to pain medication & hypovolemia/dehydration +/- sepsis Elevated BNP - consider levophed administration PRN, wean as tolerated to maintain MAP > 65 - holding additional IVF at this time due to pulmonary status & elevated BNP - f/u Echocardiogram   Acute Kidney Injury suspect prerenal secondary to hypovolemia and dehydration Hyponatremia Baseline Cr: 0.47, Cr on admission:1.32 - Strict I/O's: alert provider if UOP < 0.5 mL/kg/hr - gentle IVF hydration, 1 L IVF resuscitation given > due to patient's respiratory status will hold off on additional IVF at this time - Daily BMP, replace electrolytes PRN - Avoid nephrotoxic agents as able, ensure adequate renal perfusion - monitor Sodium, suspect will improve with hydration  Stage IV NSCLC with metastases Chronic Pain - fentanyl 50 mcg Q 3 H PRN - lidocaine patches for back - supportive care with offloading devices - hold outpatient oxycodone at this time, consider restarting if patient's hemodynamics stabilize  Sacral Pressure Injury - PTA - turn patient Q 2 h, utilize offloading devices PRN - consult to WOC - consider dietary consult depending on SLP eval  Pre-Diabetes Hypoglycemia Hemoglobin A1C: pending - Monitor CBG Q 4 hours - consider D5 NS if patient remains hypoglycemic - target range while in ICU: 140-180 - follow ICU hyper/hypo-glycemia protocol - metformin on hold  Best Practice (right click and "Reselect all SmartList Selections" daily)  Diet/type: NPO DVT prophylaxis: LMWH GI prophylaxis: PPI Lines: yes and it is still  needed PORT in place Foley:  N/A Code Status:  full code Last date of multidisciplinary goals of care discussion [08/23/23]  Labs   CBC: Recent Labs  Lab 08/23/23 1431  WBC 8.6  NEUTROABS 7.4  HGB 9.5*  HCT 27.9*  MCV 87.2  PLT 242    Basic Metabolic Panel: Recent Labs  Lab 08/23/23 1431  NA 129*  K 4.6  CL 90*  CO2 24  GLUCOSE 62*  BUN 34*  CREATININE 1.32*  CALCIUM 7.8*   GFR: Estimated Creatinine Clearance: 32.8 mL/min (A) (by C-G formula based on SCr of 1.32 mg/dL (H)). Recent Labs  Lab 08/23/23 1431  WBC 8.6    Liver Function Tests: Recent Labs  Lab 08/23/23 1431  AST 96*  ALT 31  ALKPHOS 162*  BILITOT 1.6*  PROT 6.4*  ALBUMIN 2.6*   No results for input(s): "LIPASE", "AMYLASE" in the last 168 hours. No results for input(s): "AMMONIA" in the last 168 hours.  ABG    Component Value Date/Time   PHART 7.413 09/17/2020 1201   PCO2ART 33.9 09/17/2020 1201   PO2ART 75.5 (L) 09/17/2020 1201   HCO3 21.2 09/17/2020 1201   ACIDBASEDEF 2.7 (H) 09/17/2020 1201   O2SAT 96.0 09/17/2020 1201     Coagulation Profile: Recent Labs  Lab 08/23/23 2042  INR 1.6*    Cardiac Enzymes: No results for input(s): "CKTOTAL", "CKMB", "CKMBINDEX", "TROPONINI" in the last 168 hours.  HbA1C: Hgb A1c MFr Bld  Date/Time Value Ref Range  Status  11/26/2020 12:14 PM 6.2 (H) 4.8 - 5.6 % Final    Comment:             Prediabetes: 5.7 - 6.4          Diabetes: >6.4          Glycemic control for adults with diabetes: <7.0   08/27/2020 12:25 PM 6.2 (H) 4.8 - 5.6 % Final    Comment:             Prediabetes: 5.7 - 6.4          Diabetes: >6.4          Glycemic control for adults with diabetes: <7.0     CBG: No results for input(s): "GLUCAP" in the last 168 hours.  Review of Systems: Positives in BOLD  Gen: Denies fever, chills, weight change, fatigue, poor PO intake, night sweats HEENT: Denies blurred vision, double vision, hearing loss, tinnitus, sinus  congestion, rhinorrhea, sore throat, neck stiffness, dysphagia PULM: Denies shortness of breath, cough, sputum production, hemoptysis, wheezing CV: Denies chest pain, edema, orthopnea, paroxysmal nocturnal dyspnea, palpitations GI: Denies abdominal pain, nausea, vomiting, diarrhea, hematochezia, melena, constipation, change in bowel habits GU: Denies dysuria, hematuria, polyuria, oliguria, urethral discharge Endocrine: Denies hot or cold intolerance, polyuria, polyphagia or appetite change Derm: Denies rash, dry skin, scaling or peeling skin change Heme: Denies easy bruising, bleeding, bleeding gums Neuro: Denies headache, numbness, weakness, slurred speech, loss of memory or consciousness  Past Medical History:  She,  has a past medical history of Cancer (HCC), Cancer associated pain, Depression, Duodenitis, Dyspnea, Family history of cancer, High cholesterol, Personal history of colonic polyps, Pre-diabetes, Umbilical hernia, and UTI (urinary tract infection).   Surgical History:   Past Surgical History:  Procedure Laterality Date   COLONOSCOPY WITH PROPOFOL N/A 03/24/2021   Procedure: COLONOSCOPY WITH PROPOFOL;  Surgeon: Wyline Mood, MD;  Location: [facility];  Service: Gastroenterology;  Laterality: N/A;   INTERCOSTAL NERVE BLOCK  09/19/2020   Procedure: INTERCOSTAL NERVE BLOCK;  Surgeon: Loreli Slot, MD;  Location: [facility] OR;  Service: Thoracic;;   IR IMAGING GUIDED PORT INSERTION  09/23/2021   LUNG LOBECTOMY Right    LUNG REMOVAL, PARTIAL Right 09/19/2020   NODE DISSECTION Right 09/19/2020   Procedure: NODE DISSECTION;  Surgeon: Loreli Slot, MD;  Location: [facility] OR;  Service: Thoracic;  Laterality: Right;   VIDEO BRONCHOSCOPY N/A 07/08/2021   Procedure: VIDEO BRONCHOSCOPY;  Surgeon: Loreli Slot, MD;  Location: [facility] OR;  Service: Thoracic;  Laterality: N/A;     Social History:   reports that she has been smoking cigarettes. She has a 10.8 pack-year smoking  history. She has never used smokeless tobacco. She reports current alcohol use of about 2.0 standard drinks of alcohol per week. She reports that she does not use drugs.   Family History:  Her family history includes Cancer in her brother and mother; Diabetes in her brother, father, and mother; Healthy in her sister; Heart attack in her father; Heart disease in her brother; Hepatitis in her brother; Hypertension in her brother; Stroke in her father.   Allergies No Known Allergies   Home Medications  Prior to Admission medications   Medication Sig Start Date End Date Taking? Authorizing Provider  albuterol (PROVENTIL HFA) 108 (90 Base) MCG/ACT inhaler Inhale 2 puffs into the lungs once every 6 (six) hours as needed for wheezing or shortness of breath. 07/27/23   Earna Coder, MD  DULoxetine (CYMBALTA) 30  MG capsule Take 1 capsule (30 mg total) by mouth daily. For nausea, appetite, sleep 04/18/23   Alinda Dooms, NP  lidocaine-prilocaine (EMLA) cream Apply one application topically the the affected area(s) daily as needed. 05/16/23   Earna Coder, MD  loratadine (CLARITIN) 10 MG tablet Take 1 tablet (10 mg total) by mouth daily. 10/16/21   Earna Coder, MD  metFORMIN (GLUCOPHAGE) 500 MG tablet Take 1 tablet (500 mg total) by mouth daily with breakfast. 03/04/23   Earna Coder, MD  naloxone [facility]) nasal spray 4 mg/0.1 mL SPRAY 1 SPRAY INTO ONE NOSTRIL AS DIRECTED FOR OPIOID OVERDOSE (TURN PERSON ON SIDE AFTER DOSE. IF NO RESPONSE IN 2-3 MINUTES OR PERSON RESPONDS BUT RELAPSES, REPEAT USING A NEW SPRAY DEVICE AND SPRAY INTO THE OTHER NOSTRIL. CALL 911 AFTER USE.) * EMERGENCY USE ONLY * Patient not taking: Reported on 07/26/2023 07/29/22   Borders, Daryl Eastern, NP  OLANZapine (ZYPREXA) 5 MG tablet Take 1 tablet (5 mg total) by mouth at bedtime. 05/16/23   Earna Coder, MD  ondansetron (ZOFRAN) 4 MG tablet TAKE 2 TABLETS (8MG ) BY MOUTH ONCE EVERY 8 HOURS AS NEEDED  FOR NAUSEA OR VOMITING. 05/16/23   Earna Coder, MD  oxyCODONE (OXYCONTIN) 60 MG 12 hr tablet Take 1 tablet by mouth every 12 (twelve) hours. 08/05/23   Borders, Daryl Eastern, NP  oxyCODONE (ROXICODONE) 15 MG immediate release tablet Take 1-2 tablets (15-30 mg total) by mouth every 4 (four) hours as needed for pain. 08/05/23   Borders, Daryl Eastern, NP  prochlorperazine (COMPAZINE) 10 MG tablet Take 1 tablet (10 mg total) by mouth once every 6 (six) hours as needed for nausea or vomiting. 08/31/21   Earna Coder, MD  psyllium (METAMUCIL) 58.6 % packet Take 1 packet by mouth daily.    [provider]     Critical care time: 68 minutes     Betsey Holiday, AGACNP-BC Acute Care Nurse Practitioner [facility]   [phone] / [phone] Please see Amion for details.

## 2023-08-23 NOTE — Progress Notes (Signed)
[facility] Liaison note:   This patient is a current [facility] patient. [facility] will continue to follow for discharge disposition.    Please call for any Hospice  related questions or concerns.   [facility] Liaison [phone]

## 2023-08-23 NOTE — ED Notes (Signed)
Rotated pt by placing folded sheet under left buttocks.

## 2023-08-23 NOTE — ED Provider Notes (Signed)
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  Patient's sister is at bedside.  She reports that about 3 days ago patient began to have increased weakness and was no longer able to walk and was less responsive.  Does report that the patient is on hospice for metastatic lung cancer, but reports that they were told that her "blood is low" and are concerned about why she is so weak.  They report she is supposed to wear oxygen at home but is frequently not adherent with this.  Patient requesting pain meds, not able to provide further history.   I did review the patient's oncology note from 07/29/2023.  At that time, hospice with DNR/DNI was recommended but family wished to keep the patient full code.    Physical Exam   Triage Vital Signs: ED Triage Vitals [08/23/23 1429]  Encounter Vitals Group     BP (!) 87/58     Systolic BP Percentile      Diastolic BP Percentile      Pulse Rate 92     Resp 16     Temp 99 F (37.2 C)     Temp Source Oral     SpO2 91 %     Weight      Height      Head Circumference      Peak Flow      Pain Score      Pain Loc      Pain Education      Exclude from Growth Chart     Most recent vital signs: Vitals:   08/23/23 2200 08/23/23 2224  BP: (!) 93/57 105/65  Pulse: 87 91  Resp: (!) 21 (!) 21  Temp:  98.3 F (36.8 C)  SpO2: 92% 98%     General: Awake, not answering questions, but intermittently asking for pain medicine and water CV:  Regular rate, good peripheral perfusion.  Resp:  Lung sounds coarse bilaterally, mildly labored respirations Abd:  Soft, prominent firm swelling over the upper abdomen that is not obviously tender to palpation Neuro:  No gross  facial asymmetry, intermittently following commands in extremities, does not take part in strength testing   ED Results / Procedures / Treatments   Labs (all labs ordered are listed, but only abnormal results are displayed) Labs Reviewed  CBC WITH DIFFERENTIAL/PLATELET - Abnormal; Notable for the following components:      Result Value   RBC 3.20 (*)    Hemoglobin 9.5 (*)    HCT 27.9 (*)    RDW 18.8 (*)    Lymphs Abs 0.5 (*)    All other components within normal limits  COMPREHENSIVE METABOLIC PANEL - Abnormal; Notable for the following components:   Sodium 129 (*)    Chloride 90 (*)    Glucose, Bld 62 (*)    BUN 34 (*)    Creatinine, Ser 1.32 (*)    Calcium 7.8 (*)    Total Protein 6.4 (*)    Albumin 2.6 (*)    AST 96 (*)    Alkaline Phosphatase 162 (*)    Total Bilirubin 1.6 (*)    GFR, Estimated 46 (*)    All  other components within normal limits  BRAIN NATRIURETIC PEPTIDE - Abnormal; Notable for the following components:   B Natriuretic Peptide 281.3 (*)    All other components within normal limits  PROTIME-INR - Abnormal; Notable for the following components:   Prothrombin Time 18.8 (*)    INR 1.6 (*)    All other components within normal limits  MRSA NEXT GEN BY PCR, NASAL  GLUCOSE, CAPILLARY  CBC  HIV ANTIBODY (ROUTINE TESTING W REFLEX)  MAGNESIUM  PHOSPHORUS  COMPREHENSIVE METABOLIC PANEL  PHOSPHORUS  MAGNESIUM  PROCALCITONIN  LACTIC ACID, PLASMA  TROPONIN I (HIGH SENSITIVITY)  TROPONIN I (HIGH SENSITIVITY)     EKG EKG independently reviewed interpreted by myself (ER attending) demonstrates:  EKG demonstrates sinus rhythm at a rate of 89, PR 121, QRS 82, QTc 456, no acute ST changes  RADIOLOGY Imaging independently reviewed and interpreted by myself demonstrates:  CT head with significant enlargement of a metastatic lesion now causing some mass effect on the right temporal lobe.  Multiple additional lesions noted. CTA of the chest without large  central PE on my review, significant metastatic disease noted.  Radiology does note small nonocclusive segmental and subsegmental pulmonary embolism. CT abdomen pelvis with worsened metastatic disease burden  PROCEDURES:  Critical Care performed: Yes, see critical care procedure note(s)  CRITICAL CARE Performed by: Trinna Post   Total critical care time: 50 minutes  Critical care time was exclusive of separately billable procedures and treating other patients.  Critical care was necessary to treat or prevent imminent or life-threatening deterioration.  Critical care was time spent personally by me on the following activities: development of treatment plan with patient and/or surrogate as well as nursing, discussions with consultants, evaluation of patient's response to treatment, examination of patient, obtaining history from patient or surrogate, ordering and performing treatments and interventions, ordering and review of laboratory studies, ordering and review of radiographic studies, pulse oximetry and re-evaluation of patient's condition.   Procedures   MEDICATIONS ORDERED IN ED: Medications  docusate sodium (COLACE) capsule 100 mg (has no administration in time range)  polyethylene glycol (MIRALAX / GLYCOLAX) packet 17 g (has no administration in time range)  enoxaparin (LOVENOX) injection 30 mg (has no administration in time range)  norepinephrine (LEVOPHED) 4mg  in (0.016 mg/mL) premix infusion (has no administration in time range)  lidocaine (LIDODERM) 5 % 2 patch (2 patches Transdermal Patch Applied 08/23/23 2251)  pantoprazole (PROTONIX) injection 40 mg (40 mg Intravenous Given 08/23/23 2248)  Chlorhexidine Gluconate Cloth 2 % PADS 6 each (has no administration in time range)  fentaNYL (SUBLIMAZE) injection 50 mcg (has no administration in time range)  fentaNYL (SUBLIMAZE) injection 50 mcg (50 mcg Intravenous Given 08/23/23 1531)  sodium chloride 0.9 % bolus 1,000 mL (0  mLs Intravenous Stopped 08/23/23 1735)  iohexol (OMNIPAQUE) 350 MG/ML injection 100 mL (100 mLs Intravenous Contrast Given 08/23/23 1610)  dextrose (D10W) 10% bolus 250 mL (0 mLs Intravenous Stopped 08/23/23 2139)  fentaNYL (SUBLIMAZE) injection 50 mcg (50 mcg Intravenous Given 08/23/23 2254)     IMPRESSION / MDM / ASSESSMENT AND PLAN / ED COURSE  I reviewed the triage vital signs and the nursing notes.  Differential diagnosis includes, but is not limited to, progression of known metastatic cancer, intracranial bleed, pulmonary embolism, CHF exacerbation, COPD exacerbation, anemia, electrolyte abnormality  Patient's presentation is most consistent with acute presentation with potential threat to life or bodily function.  62 year old female presenting with worsened weakness in the setting of known  metastatic cancer, found to be hypoxic requiring 5 to 6 L O2, family uncertain on baseline home O2.  Labs here with stable anemia, multiple electrolyte derangements.  Mild hypoglycemia, ordered for D10 bolus.  AKI from recent baseline.  CT scans with extensive burden from known metastatic cancer.  Patient's daughter initially present at bedside, had extended conversation with her regarding the patient's goals of care.  They are adamant that they want the patient treated for her pain, but would also like her to remain a full code.  I did discuss how pain medication can affect her blood pressure and discussed consideration of comfort care.  Daughter was not interested in this option currently and would like to pursue continued care.  CT angio ultimately resulted with small PE.  No family present on reevaluation.  Do think patient would certainly benefit from further palliative care discussion given her worsened clinical status.  Will reach out to hospitalist team to discuss admission.  Case was discussed with Dr. Allena Katz.  As the patient is not currently on any comfort measures, she did express concern for  patient's blood pressure as well as her CT findings including her CT head.  With large intracranial mass and anemia, she did feel that it would be best to hold off on heparin which was canceled.  She did recommend discussion with ICU team.  Case was reviewed with NP Andrey Farmer with ICU team.  She will evaluate the patient for anticipated admission.     FINAL CLINICAL IMPRESSION(S) / ED DIAGNOSES   Final diagnoses:  Acute on chronic hypoxic respiratory failure (HCC)  Other acute pulmonary embolism without acute cor pulmonale (HCC)  Metastatic malignant neoplasm, unspecified site [facility])     Rx / DC Orders   ED Discharge Orders     None        Note:  This document was prepared using Dragon voice recognition software and may include unintentional dictation errors.   Trinna Post, MD 08/23/23 [phone]

## 2023-08-23 NOTE — H&P (Incomplete)
NAME:  Stacie Kennedy, MRN:  [id number], DOB:  06/30/1961, LOS: 0 ADMISSION DATE:  08/23/2023, CONSULTATION DATE:  08/23/23 REFERRING MD:  Dr. Rosalia Hammers, CHIEF COMPLAINT:  Leg swelling   History of Present Illness:  62 yo F presenting to [facility] ED via EMS from home on 08/23/23 for evaluation of leg swelling and back pain from a sacral wound.  History obtained per chart review, patient bedside report and conversation with sister and niece. 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  Per oncology notation patient's initial diagnosis of primary cancer of the RUL in January 2022 s/p lobectomy.  She is post multiple rounds of chemotherapy.  Liver biopsy in September 2023 revealed metastatic cancer with neuroendocrine features.  This was followed for other runs of chemotherapy ending in August 2024. declined adjuvant therapy and was recently found to have a large destructive mass in the scapula with probable liver metastasis.  Due to severe pain with scapular metastases she was started on XRT as well as referred to palliative care.    ED course: ***.  Medications given: fentanyl, D10W bolus, 1 L NS bolus, IV contrast Initial Vitals: 99, 19, 84, 90/60 & 90% on 2 L [place] Significant labs: (Labs/ Imaging personally reviewed) I, Cheryll Cockayne Rust-Chester, AGACNP-BC, personally viewed and interpreted this ECG. EKG Interpretation: Date: ***, EKG Time: ***, Rate: ***, Rhythm: ***, QRS Axis:  *** Intervals: ***, ST/T Wave abnormalities: ***, Narrative Interpretation: *** Chemistry: Na+:129, K+: 4.6, BUN/Cr.: 34/1.32, Serum CO2/ AG: 24/15, Cl: 90, Alk phos: 162, albumin: 2.6, AST/ALT: 96/31, T.Bili: 1.6 Hematology: WBC: 8.6, Hgb: 9.5,  Troponin: ***, BNP: 281.3, Lactic/ PCT: ***, COVID-19 & Influenza A/B: ***  ABG: *** CXR 08/23/23: emphysema with numerous bilateral pulmonary masses consistent with metastatic disease, appears increased compared with CT from august CT  head wo contrast 08/23/23: Marked enlargement of a metastatic lesion affecting the calvarium at the lateral margin of the middle cranial fossa on the right. Previously, this lesion measured up to 2 cm in thickness, with both external and internal soft tissue component. Now, the thickness is up to 4.3 cm. This bulges much more into the lateral aspect of the middle cranial fossa and has some mass-effect upon the right temporal lobe. Second bone lesion previously seen in the occiput put has grown extensively. In July, this only measured a cm in size. Presently, the infiltrating bone component measures up to 4.4 cm, with tumor bulging external to the calvarium and inward towards the brain. There could be involvement of the superior sagittal sinus. Maximal thickness of tumor is 2.5 cm. No third calvarial lesion is seen. 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 CT angio PE 08/23/23: Small nonocclusive right segmental and subsegmental pulmonary emboli in this patient is status post right upper lobectomy. Progression of pulmonary metastatic disease. Right scapular metastatic lesion with involvement of adjacent muscle is again noted. CT abdomen/pelvis w contrast 08/23/23: Progression of hepatic metastatic disease. Interim development or enlargement of multiple right renal and perirenal/retroperitoneal masses consistent with metastatic disease. Increased lobulated mass in the left gluteal subcutaneous soft tissues consistent with metastatic lesion. Increased size and heterogeneity of previously noted left adrenal adenoma, now contiguous with solid enhancing mass and concerning for metastatic disease. Ill-defined bilateral foci of hypoenhancement within both kidneys on delayed views, question pyelonephritis versus metastatic disease. Extensive subcutaneous edema consistent with anasarca. Incompletely visualized low-density appearance of left abductor muscles which could be due to nonspecific muscle edema  or inflammatory process. Aortic atherosclerosis.  PCCM consulted for admission due to ***.  Pertinent  Medical History  Stage IV NSCLC with mets Depression HLD Pre-diabetes  Significant Hospital Events: Including procedures, antibiotic start and stop dates in addition to other pertinent events   08/23/23: Admit to ICU with metastatic NSCLC and hypotension in the setting of bilateral small PE with high risk of decline.  Interim History / Subjective:  ***  Objective   Blood pressure (!) 91/59, pulse 88, temperature 98.2 F (36.8 C), temperature source Oral, resp. rate 18, weight 47 kg, SpO2 95%.       No intake or output data in the 24 hours ending 08/23/23 2116 Filed Weights   08/23/23 2028  Weight: 47 kg    Examination: General: *** HENT: *** Lungs: *** Cardiovascular: *** Abdomen: *** Extremities: *** Neuro: *** GU: ***  Resolved Hospital Problem list   ***  Assessment & Plan:  ***  Best Practice (right click and "Reselect all SmartList Selections" daily)   Diet/type: {diet type:25684} DVT prophylaxis: {anticoagulation (Optional):25687} GI prophylaxis: {ZO:10960} Lines: {Central Venous Access:25771} Foley:  {Central Venous Access:25691} Code Status:  {Code Status:26939} Last date of multidisciplinary goals of care discussion [***]  Labs   CBC: Recent Labs  Lab 08/23/23 1431  WBC 8.6  NEUTROABS 7.4  HGB 9.5*  HCT 27.9*  MCV 87.2  PLT 242    Basic Metabolic Panel: Recent Labs  Lab 08/23/23 1431  NA 129*  K 4.6  CL 90*  CO2 24  GLUCOSE 62*  BUN 34*  CREATININE 1.32*  CALCIUM 7.8*   GFR: Estimated Creatinine Clearance: 32.8 mL/min (A) (by C-G formula based on SCr of 1.32 mg/dL (H)). Recent Labs  Lab 08/23/23 1431  WBC 8.6    Liver Function Tests: Recent Labs  Lab 08/23/23 1431  AST 96*  ALT 31  ALKPHOS 162*  BILITOT 1.6*  PROT 6.4*  ALBUMIN 2.6*   No results for input(s): "LIPASE", "AMYLASE" in the last 168 hours. No  results for input(s): "AMMONIA" in the last 168 hours.  ABG    Component Value Date/Time   PHART 7.413 09/17/2020 1201   PCO2ART 33.9 09/17/2020 1201   PO2ART 75.5 (L) 09/17/2020 1201   HCO3 21.2 09/17/2020 1201   ACIDBASEDEF 2.7 (H) 09/17/2020 1201   O2SAT 96.0 09/17/2020 1201     Coagulation Profile: Recent Labs  Lab 08/23/23 2042  INR 1.6*    Cardiac Enzymes: No results for input(s): "CKTOTAL", "CKMB", "CKMBINDEX", "TROPONINI" in the last 168 hours.  HbA1C: Hgb A1c MFr Bld  Date/Time Value Ref Range Status  11/26/2020 12:14 PM 6.2 (H) 4.8 - 5.6 % Final    Comment:             Prediabetes: 5.7 - 6.4          Diabetes: >6.4          Glycemic control for adults with diabetes: <7.0   08/27/2020 12:25 PM 6.2 (H) 4.8 - 5.6 % Final    Comment:             Prediabetes: 5.7 - 6.4          Diabetes: >6.4          Glycemic control for adults with diabetes: <7.0     CBG: No results for input(s): "GLUCAP" in the last 168 hours.  Review of Systems:   ***  Past Medical History:  She,  has a past medical history of Cancer (HCC),  Cancer associated pain, Depression, Duodenitis, Dyspnea, Family history of cancer, High cholesterol, Personal history of colonic polyps, Pre-diabetes, Umbilical hernia, and UTI (urinary tract infection).   Surgical History:   Past Surgical History:  Procedure Laterality Date  . COLONOSCOPY WITH PROPOFOL N/A 03/24/2021   Procedure: COLONOSCOPY WITH PROPOFOL;  Surgeon: Wyline Mood, MD;  Location: [facility] ENDOSCOPY;  Service: Gastroenterology;  Laterality: N/A;  . INTERCOSTAL NERVE BLOCK  09/19/2020   Procedure: INTERCOSTAL NERVE BLOCK;  Surgeon: Loreli Slot, MD;  Location: [facility] OR;  Service: Thoracic;;  . IR IMAGING GUIDED PORT INSERTION  09/23/2021  . LUNG LOBECTOMY Right   . LUNG REMOVAL, PARTIAL Right 09/19/2020  . NODE DISSECTION Right 09/19/2020   Procedure: NODE DISSECTION;  Surgeon: Loreli Slot, MD;  Location: [facility] OR;   Service: Thoracic;  Laterality: Right;  Marland Kitchen VIDEO BRONCHOSCOPY N/A 07/08/2021   Procedure: VIDEO BRONCHOSCOPY;  Surgeon: Loreli Slot, MD;  Location: [facility] OR;  Service: Thoracic;  Laterality: N/A;     Social History:   reports that she has been smoking cigarettes. She has a 10.8 pack-year smoking history. She has never used smokeless tobacco. She reports current alcohol use of about 2.0 standard drinks of alcohol per week. She reports that she does not use drugs.   Family History:  Her family history includes Cancer in her brother and mother; Diabetes in her brother, father, and mother; Healthy in her sister; Heart attack in her father; Heart disease in her brother; Hepatitis in her brother; Hypertension in her brother; Stroke in her father.   Allergies No Known Allergies   Home Medications  Prior to Admission medications   Medication Sig Start Date End Date Taking? Authorizing Provider  albuterol (PROVENTIL HFA) 108 (90 Base) MCG/ACT inhaler Inhale 2 puffs into the lungs once every 6 (six) hours as needed for wheezing or shortness of breath. 07/27/23   Earna Coder, MD  DULoxetine (CYMBALTA) 30 MG capsule Take 1 capsule (30 mg total) by mouth daily. For nausea, appetite, sleep 04/18/23   Alinda Dooms, NP  lidocaine-prilocaine (EMLA) cream Apply one application topically the the affected area(s) daily as needed. 05/16/23   Earna Coder, MD  loratadine (CLARITIN) 10 MG tablet Take 1 tablet (10 mg total) by mouth daily. 10/16/21   Earna Coder, MD  metFORMIN (GLUCOPHAGE) 500 MG tablet Take 1 tablet (500 mg total) by mouth daily with breakfast. 03/04/23   Earna Coder, MD  naloxone [facility]) nasal spray 4 mg/0.1 mL SPRAY 1 SPRAY INTO ONE NOSTRIL AS DIRECTED FOR OPIOID OVERDOSE (TURN PERSON ON SIDE AFTER DOSE. IF NO RESPONSE IN 2-3 MINUTES OR PERSON RESPONDS BUT RELAPSES, REPEAT USING A NEW SPRAY DEVICE AND SPRAY INTO THE OTHER NOSTRIL. CALL 911 AFTER USE.) *  EMERGENCY USE ONLY * Patient not taking: Reported on 07/26/2023 07/29/22   Borders, Daryl Eastern, NP  OLANZapine (ZYPREXA) 5 MG tablet Take 1 tablet (5 mg total) by mouth at bedtime. 05/16/23   Earna Coder, MD  ondansetron (ZOFRAN) 4 MG tablet TAKE 2 TABLETS (8MG ) BY MOUTH ONCE EVERY 8 HOURS AS NEEDED FOR NAUSEA OR VOMITING. 05/16/23   Earna Coder, MD  oxyCODONE (OXYCONTIN) 60 MG 12 hr tablet Take 1 tablet by mouth every 12 (twelve) hours. 08/05/23   Borders, Daryl Eastern, NP  oxyCODONE (ROXICODONE) 15 MG immediate release tablet Take 1-2 tablets (15-30 mg total) by mouth every 4 (four) hours as needed for pain. 08/05/23   Borders, Daryl Eastern, NP  prochlorperazine (COMPAZINE) 10 MG tablet Take 1 tablet (10 mg total) by mouth once every 6 (six) hours as needed for nausea or vomiting. 08/31/21   Earna Coder, MD  psyllium (METAMUCIL) 58.6 % packet Take 1 packet by mouth daily.    [provider]     Critical care time: ***

## 2023-08-23 NOTE — ED Triage Notes (Signed)
Pt to Ed via [facility] from home for leg swelling and bedsore to backside.  BP 103/56 R-20 O2-96 4L [place] GBS 96 T-98.4 Axillary

## 2023-08-23 NOTE — Progress Notes (Signed)
eLink Physician-Brief Progress Note Patient Name: Stacie Kennedy DOB: April 22, 1961 MRN: [id number]   Date of Service  08/23/2023  HPI/Events of Note  Patient with end stage metastatic lung cancer admitted for failure to thrive and small pulmonary emboli, patient was previously on hospice care.  eICU Interventions  New Patient Evaluation.        Thomasene Lot Mu[place] Hallam 08/23/2023, 11:07 PM

## 2023-08-23 NOTE — ED Notes (Signed)
EDP notified about pt's bp of 75/45.

## 2023-08-23 NOTE — IPAL (Signed)
  Interdisciplinary Goals of Care Family Meeting   Date carried out: 08/23/2023  Location of the meeting: Unit  Member's involved: Nurse Practitioner and Family Member or next of kin  Durable Power of Attorney or acting medical decision maker: Sister, Misty Stanley - Niece, Son- Ethelene Browns    Discussion: We discussed goals of care for [employer]. We discussed extensive metastasis of cancer, with significant enlargement of brain lesions. We discussed pulmonary emboli and inability to treat with systemic anticoagulation. We discussed concerns that patient is in the active dying process and the challenge of adequately controlling her pain without focusing solely on comfort. We discussed FULL code vs DNR/DNI vs comfort measures, with PCCM recommendations for comfort measures and DNR/DNI status. Son left before the conversation concluded. The patient's sister and niece reported that the patient has been adamant regarding FULL code status. The family does, however, understand the critical nature of her disease and will consider our discussion. They are open to palliative care follow up, and prioritize controlling her pain which we discussed might be difficult given her hemodynamics.  Code status:   Code Status: Full Code   Disposition: Continue current acute care  Time spent for the meeting: 25 minutes    Cecelia Byars Rust-Chester, NP  08/23/2023, 11:01 PM

## 2023-08-23 NOTE — Progress Notes (Addendum)
PHARMACY - ANTICOAGULATION CONSULT NOTE  Pharmacy Consult for heparin Indication: pulmonary embolus  No Known Allergies  Patient Measurements: Weight: 47 kg (103 lb 9.9 oz) Heparin Dosing Weight: 47 kg  Vital Signs: Temp: 98.2 F (36.8 C) (11/12 1958) Temp Source: Oral (11/12 1958) BP: 97/66 (11/12 1945) Pulse Rate: 100 (11/12 1945)  Labs: Recent Labs    08/23/23 1431  HGB 9.5*  HCT 27.9*  PLT 242  CREATININE 1.32*   CrCl cannot be calculated (Unknown ideal weight.).  Medical History: Past Medical History:  Diagnosis Date   Cancer (HCC)    lung cancer   Cancer associated pain    Depression    Duodenitis    Dyspnea    Family history of cancer    High cholesterol    Personal history of colonic polyps    Pre-diabetes    Umbilical hernia    UTI (urinary tract infection)    Assessment: 62 y/o female presenting with weakness. PMH significant for metastatic lung cancer on hospice. CTA shows small nonocclusive right segmental and subsegmental pulmonary emboli. Pharmacy has been consulted to initiate heparin infusion. Patient not on anticoagulation prior to admission.  Baseline labs: Hgb 9.5, plt 242, INR ordered.  Goal of Therapy:  Heparin level 0.3-0.7 units/ml Monitor platelets by anticoagulation protocol: Yes   Plan:  Give heparin bolus of 2800 units x1 Start heparin infusion at 800 units/hour Check 6-hour Anti-Xa level Monitor daily Anti-Xa levels while on heparin infusion Monitor CBC and signs/symptoms of bleeding  Thank you for involving pharmacy in this patient's care.   Rockwell Alexandria, PharmD Clinical Pharmacist 08/23/2023 8:34 PM

## 2023-08-24 ENCOUNTER — Inpatient Hospital Stay

## 2023-08-24 DIAGNOSIS — I2699 Other pulmonary embolism without acute cor pulmonale: Secondary | ICD-10-CM | POA: Diagnosis not present

## 2023-08-24 DIAGNOSIS — J9621 Acute and chronic respiratory failure with hypoxia: Principal | ICD-10-CM

## 2023-08-24 DIAGNOSIS — C799 Secondary malignant neoplasm of unspecified site: Secondary | ICD-10-CM | POA: Diagnosis not present

## 2023-08-24 DIAGNOSIS — C3490 Malignant neoplasm of unspecified part of unspecified bronchus or lung: Secondary | ICD-10-CM | POA: Diagnosis not present

## 2023-08-24 DIAGNOSIS — Z515 Encounter for palliative care: Secondary | ICD-10-CM | POA: Diagnosis not present

## 2023-08-24 DIAGNOSIS — C7951 Secondary malignant neoplasm of bone: Secondary | ICD-10-CM | POA: Diagnosis not present

## 2023-08-24 LAB — URINALYSIS, COMPLETE (UACMP) WITH MICROSCOPIC
Bilirubin Urine: NEGATIVE
Glucose, UA: NEGATIVE mg/dL
Hgb urine dipstick: NEGATIVE
Ketones, ur: 5 mg/dL — AB
Nitrite: NEGATIVE
Protein, ur: NEGATIVE mg/dL
Specific Gravity, Urine: 1.028 (ref 1.005–1.030)
pH: 5 (ref 5.0–8.0)

## 2023-08-24 LAB — PROCALCITONIN
Procalcitonin: 0.65 ng/mL
Procalcitonin: 0.61 ng/mL
Procalcitonin: 0.55 ng/mL

## 2023-08-24 LAB — CORTISOL-AM, BLOOD: Cortisol - AM: 43.1 ug/dL — ABNORMAL HIGH (ref 6.7–22.6)

## 2023-08-24 LAB — CBC
HCT: 24.8 % — ABNORMAL LOW (ref 36.0–46.0)
Hemoglobin: 8.1 g/dL — ABNORMAL LOW (ref 12.0–15.0)
MCH: 28.7 pg (ref 26.0–34.0)
MCHC: 32.7 g/dL (ref 30.0–36.0)
MCV: 87.9 fL (ref 80.0–100.0)
Platelets: 210 10*3/uL (ref 150–400)
RBC: 2.82 MIL/uL — ABNORMAL LOW (ref 3.87–5.11)
RDW: 19 % — ABNORMAL HIGH (ref 11.5–15.5)
WBC: 8 10*3/uL (ref 4.0–10.5)
nRBC: 0 % (ref 0.0–0.2)

## 2023-08-24 LAB — LACTIC ACID, PLASMA
Lactic Acid, Venous: 2 mmol/L (ref 0.5–1.9)
Lactic Acid, Venous: 1.8 mmol/L (ref 0.5–1.9)

## 2023-08-24 LAB — COMPREHENSIVE METABOLIC PANEL
ALT: 26 U/L (ref 0–44)
AST: 69 U/L — ABNORMAL HIGH (ref 15–41)
Albumin: 2.3 g/dL — ABNORMAL LOW (ref 3.5–5.0)
Alkaline Phosphatase: 126 U/L (ref 38–126)
Anion gap: 17 — ABNORMAL HIGH (ref 5–15)
BUN: 32 mg/dL — ABNORMAL HIGH (ref 8–23)
CO2: 22 mmol/L (ref 22–32)
Calcium: 7.8 mg/dL — ABNORMAL LOW (ref 8.9–10.3)
Chloride: 93 mmol/L — ABNORMAL LOW (ref 98–111)
Creatinine, Ser: 1.02 mg/dL — ABNORMAL HIGH (ref 0.44–1.00)
GFR, Estimated: >60 mL/min (ref >60)
Glucose, Bld: 80 mg/dL (ref 70–99)
Potassium: 4.1 mmol/L (ref 3.5–5.1)
Sodium: 132 mmol/L — ABNORMAL LOW (ref 135–145)
Total Bilirubin: 1.4 mg/dL — ABNORMAL HIGH (ref <1.2)
Total Protein: 5.6 g/dL — ABNORMAL LOW (ref 6.5–8.1)

## 2023-08-24 LAB — HIV ANTIBODY (ROUTINE TESTING W REFLEX): HIV Screen 4th Generation wRfx: NONREACTIVE

## 2023-08-24 LAB — TROPONIN I (HIGH SENSITIVITY): Troponin I (High Sensitivity): 16 ng/L (ref <18)

## 2023-08-24 LAB — HEMOGLOBIN A1C
Hgb A1c MFr Bld: 5.2 % (ref 4.8–5.6)
Mean Plasma Glucose: 102.54 mg/dL

## 2023-08-24 LAB — SARS CORONAVIRUS 2 BY RT PCR: SARS Coronavirus 2 by RT PCR: NEGATIVE

## 2023-08-24 LAB — MAGNESIUM
Magnesium: 1.3 mg/dL — ABNORMAL LOW (ref 1.7–2.4)
Magnesium: 1.9 mg/dL (ref 1.7–2.4)

## 2023-08-24 LAB — GLUCOSE, CAPILLARY
Glucose-Capillary: 90 mg/dL (ref 70–99)
Glucose-Capillary: 88 mg/dL (ref 70–99)
Glucose-Capillary: 95 mg/dL (ref 70–99)

## 2023-08-24 LAB — PHOSPHORUS: Phosphorus: 3.2 mg/dL (ref 2.5–4.6)

## 2023-08-24 LAB — MRSA NEXT GEN BY PCR, NASAL: MRSA by PCR Next Gen: NOT DETECTED

## 2023-08-24 MED ORDER — ORAL CARE MOUTH RINSE: 15.0000 mL | OROMUCOSAL | Status: DC | PRN

## 2023-08-24 MED ORDER — GLYCOPYRROLATE 0.2 MG/ML IJ SOLN
0.2000 mg | INTRAMUSCULAR | Status: DC | PRN
  Administered 2023-08-27: 0.2 mg via INTRAVENOUS
  Filled 2023-08-24: qty 1

## 2023-08-24 MED ORDER — MAGNESIUM SULFATE 2 GM/50ML IV SOLN
2.0000 g | Freq: Once | INTRAVENOUS | Status: AC
  Administered 2023-08-24: 2 g via INTRAVENOUS
  Filled 2023-08-24: qty 50

## 2023-08-24 MED ORDER — LORAZEPAM 2 MG/ML IJ SOLN
2.0000 mg | INTRAMUSCULAR | Status: DC | PRN
  Administered 2023-08-26 – 2023-08-27 (×3): 2 mg via INTRAVENOUS
  Filled 2023-08-24 (×3): qty 1

## 2023-08-24 MED ORDER — POLYVINYL ALCOHOL 1.4 % OP SOLN: 1.0000 [drp] | Freq: Four times a day (QID) | OPHTHALMIC | Status: DC | PRN

## 2023-08-24 MED ORDER — IPRATROPIUM-ALBUTEROL 0.5-2.5 (3) MG/3ML IN SOLN: 3.0000 mL | RESPIRATORY_TRACT | Status: DC | PRN

## 2023-08-24 MED ORDER — HYDROMORPHONE HCL 1 MG/ML IJ SOLN
1.0000 mg | INTRAMUSCULAR | Status: DC | PRN
  Administered 2023-08-24: 1 mg via INTRAVENOUS
  Administered 2023-08-25: 2 mg via INTRAVENOUS
  Filled 2023-08-24: qty 2
  Filled 2023-08-24: qty 1

## 2023-08-24 MED ORDER — HYDROMORPHONE HCL 1 MG/ML IJ SOLN
0.5000 mg | INTRAMUSCULAR | Status: DC | PRN
  Administered 2023-08-24: 1 mg via INTRAVENOUS
  Filled 2023-08-24: qty 1

## 2023-08-24 MED ORDER — SODIUM CHLORIDE 0.9 % IV BOLUS
500.0000 mL | Freq: Once | INTRAVENOUS | Status: AC
  Administered 2023-08-24: 500 mL via INTRAVENOUS

## 2023-08-24 MED ORDER — GLYCOPYRROLATE 1 MG PO TABS: 1.0000 mg | ORAL_TABLET | ORAL | Status: DC | PRN

## 2023-08-24 MED ORDER — DEXTROSE-SODIUM CHLORIDE 5-0.9 % IV SOLN: INTRAVENOUS | Status: DC

## 2023-08-24 MED ORDER — MEDIHONEY WOUND/BURN DRESSING EX PSTE
1.0000 | PASTE | Freq: Every day | CUTANEOUS | Status: DC
  Administered 2023-08-24 – 2023-08-27 (×4): 1 via TOPICAL
  Filled 2023-08-24: qty 44

## 2023-08-24 MED ORDER — OXYCODONE HCL 5 MG PO TABS
15.0000 mg | ORAL_TABLET | ORAL | Status: DC | PRN
  Administered 2023-08-24: 15 mg via ORAL
  Filled 2023-08-24: qty 3

## 2023-08-24 MED ORDER — GLYCOPYRROLATE 0.2 MG/ML IJ SOLN: 0.2000 mg | INTRAMUSCULAR | Status: DC | PRN

## 2023-08-24 MED ORDER — HYDROMORPHONE HCL-NACL 50-0.9 MG/50ML-% IV SOLN
0.0000 mg/h | INTRAVENOUS | Status: DC
  Administered 2023-08-24: 1 mg/h via INTRAVENOUS
  Administered 2023-08-25: 2 mg/h via INTRAVENOUS
  Administered 2023-08-25: 1.5 mg/h via INTRAVENOUS
  Administered 2023-08-26 – 2023-08-27 (×2): 2 mg/h via INTRAVENOUS
  Filled 2023-08-24 (×5): qty 50

## 2023-08-24 MED ORDER — HYDROMORPHONE BOLUS VIA INFUSION
1.0000 mg | INTRAVENOUS | Status: DC | PRN
  Administered 2023-08-25: 1 mg via INTRAVENOUS

## 2023-08-24 MED ORDER — IPRATROPIUM-ALBUTEROL 0.5-2.5 (3) MG/3ML IN SOLN
3.0000 mL | Freq: Three times a day (TID) | RESPIRATORY_TRACT | Status: DC
  Administered 2023-08-24: 3 mL via RESPIRATORY_TRACT
  Filled 2023-08-24: qty 3

## 2023-08-24 MED ORDER — SODIUM CHLORIDE 0.9 % IV SOLN
2.0000 g | INTRAVENOUS | Status: DC
  Administered 2023-08-24: 2 g via INTRAVENOUS
  Filled 2023-08-24: qty 20

## 2023-08-24 MED ORDER — ORAL CARE MOUTH RINSE
15.0000 mL | OROMUCOSAL | Status: DC
  Administered 2023-08-24 (×2): 15 mL via OROMUCOSAL

## 2023-08-24 MED ORDER — IPRATROPIUM-ALBUTEROL 0.5-2.5 (3) MG/3ML IN SOLN: 3.0000 mL | Freq: Two times a day (BID) | RESPIRATORY_TRACT | Status: DC

## 2023-08-24 NOTE — Plan of Care (Signed)

## 2023-08-24 NOTE — IPAL (Signed)
  Interdisciplinary Goals of Care Family Meeting   Date carried out: 08/24/2023  Location of the meeting: Conference room  Member's involved: Physician, Nurse Practitioner, and Family Member or next of kin  Durable Power of Attorney or acting medical decision maker: Sister Misty Stanley), Son Ethelene Browns), and Niece Deanna Artis).  Discussion: We discussed goals of care for [facility]. Family fully understanding that Ms. Giarratano's condition is terminal. She has end stage metastatic lung cancer with mets to multiple organs, including to the liver with near consumption of the whole organ. She is coming in with failure to thrive, wasting and cancer cachexia. Family understands that there are no other interventions to offer her. Explained that CPR would involve breaking her ribs and causing her and immense amount of pain and suffering. I explained that it is my medical opinion that CPR is futile and would not be indicated. It would not alter the course of her treatment. The oncology team concurs and they have no further interventions to offer Ms. Renae Fickle.  We discussed options that include in-patient comfort measures, hospice at facility, and hospice at home. Family relayed patient's wishes wanting to pass away at home, and we will work towards that. Will discuss with family their comfort level in administering medications and in taking care of her.  Code status:   Code Status: Full Code   Disposition: Home with Hospice  Time spent for the meeting: 25 minutes    Raechel Chute, MD  08/24/2023, 11:34 AM

## 2023-08-24 NOTE — Consult Note (Signed)
WOC Nurse Consult Note: per family member hospice comes out and cares for wound at home   Reason for Consult: Sacral wound  Wound type: Unstageable Pressure Injury Sacrum  Pressure Injury POA: Yes Measurement: 2 cm x 2 cm  Wound bed: 80% yellow slough 20% pink moist  Drainage (amount, consistency, odor) minimal tan exudate  Periwound: mild erythema  Dressing procedure/placement/frequency: Clean sacral wound with NS, apply Medihoney to wound bed daily, cover with dry gauze and silicone foam. May lift foam daily to replace Medihoney. Change foam q3 days and prn soiling.   Patient should remain on a low air loss mattress throughout hospitalization for moisture management and pressure redistribution.     POC discussed with bedside nurse.    WOC team will not follow.  Reconsult if further needs arise   Thank you,    Priscella Mann MSN, RN-BC, [employer] [phone]

## 2023-08-24 NOTE — Evaluation (Signed)
Clinical/Bedside Swallow Evaluation Patient Details  Name: Stacie Kennedy MRN: [id number] Date of Birth: July 10, 1961  Today's Date: 08/24/2023 Time: SLP Start Time (ACUTE ONLY): 0900 SLP Stop Time (ACUTE ONLY): 0950 SLP Time Calculation (min) (ACUTE ONLY): 50 min  Past Medical History:  Past Medical History:  Diagnosis Date   Cancer (HCC)    lung cancer   Cancer associated pain    Depression    Duodenitis    Dyspnea    Family history of cancer    High cholesterol    Personal history of colonic polyps    Pre-diabetes    Umbilical hernia    UTI (urinary tract infection)    Past Surgical History:  Past Surgical History:  Procedure Laterality Date   COLONOSCOPY WITH PROPOFOL N/A 03/24/2021   Procedure: COLONOSCOPY WITH PROPOFOL;  Surgeon: Wyline Mood, MD;  Location: [facility] ENDOSCOPY;  Service: Gastroenterology;  Laterality: N/A;   INTERCOSTAL NERVE BLOCK  09/19/2020   Procedure: INTERCOSTAL NERVE BLOCK;  Surgeon: Loreli Slot, MD;  Location: [facility] OR;  Service: Thoracic;;   IR IMAGING GUIDED PORT INSERTION  09/23/2021   LUNG LOBECTOMY Right    LUNG REMOVAL, PARTIAL Right 09/19/2020   NODE DISSECTION Right 09/19/2020   Procedure: NODE DISSECTION;  Surgeon: Loreli Slot, MD;  Location: [facility] OR;  Service: Thoracic;  Laterality: Right;   VIDEO BRONCHOSCOPY N/A 07/08/2021   Procedure: VIDEO BRONCHOSCOPY;  Surgeon: Loreli Slot, MD;  Location[facility] OR;  Service: Thoracic;  Laterality: N/A;   HPI:  Pt is a 62 yo F presenting to [facility] ED via EMS from home on 08/23/23 for evaluation of leg swelling and back pain from a sacral wound.  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  Per oncology notation, patient's initial diagnosis of primary cancer of the RUL in January 2022 s/p lobectomy.  She is post multiple rounds of chemotherapy.  In November 2022 PET scan revealed right scapular lesion status post radiation therapy. Liver  biopsy in September 2023 revealed metastatic cancer with neuroendocrine features.  This was followed by four other runs of chemotherapy ending in August 2024.  Two new brain lesions were found in 04/2023. She was referred to palliative care and more recently Hospice services.   Patient lives at home with a "friend"/significant other, her sister niece and son are all involved in her care.  Per report, pt appears to have been declining over the last month but more significantly over the last week.   CHest CT: No acute airspace disease or pleural  effusion. Progression of pulmonary metastatic disease. Dominant  medial left lung base pulmonary mass measures 5.5 x 3 cm compared  with 2.7 x 1.8 cm previously. Peripheral right lower lobe index  pulmonary nodule measures 4 by 2.7 cm on series 7, previously 2.3 x 1.7 cm. Enlargement of numerous additional pulmonary nodules. Right middle lobe collapse. Status post right upper lobectomy. Musculoskeletal: Right scapular metastatic lesion with involvement of adjacent muscle is noted.  Imaging concerning for enlarging brain masses, 1 mass has doubled in size since MRI in July 2024.    Assessment / Plan / Recommendation  Clinical Impression   Pt seen today for BSE; Sister at bedside. Palliative Care has been consult for further discussion of pt's GOC in setting of Cancer progression per chart notes.  SLP Visit Diagnosis: Dysphagia, unspecified (R13.10)    Aspiration Risk  Mild aspiration risk;Risk for inadequate nutrition/hydration    Diet Recommendation  Thin;Dysphagia 1 (puree)  Medication Administration: Crushed with puree    Other  Recommendations Recommended Consults:  (Palliative Care following) Oral Care Recommendations: Oral care BID;Oral care before and after PO;Staff/trained caregiver to provide oral care    Recommendations for follow up therapy are one component of a multi-disciplinary discharge planning process, led by the attending physician.   Recommendations may be updated based on patient status, additional functional criteria and insurance authorization.  Follow up Recommendations Follow physician's recommendations for discharge plan and follow up therapies      Assistance Recommended at Discharge    Functional Status Assessment Patient has had a recent decline in their functional status and/or demonstrates limited ability to make significant improvements in function in a reasonable and predictable amount of time  Frequency and Duration min 1 x/week  1 week       Prognosis Prognosis for improved oropharyngeal function: Guarded Barriers to Reach Goals: Time post onset;Severity of deficits Barriers/Prognosis Comment: Ca progression      Swallow Study   General Date of Onset: 08/23/23 HPI: Pt is a 62 yo F presenting to [facility] ED via EMS from home on 08/23/23 for evaluation of leg swelling and back pain from a sacral wound.  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  Per oncology notation, patient's initial diagnosis of primary cancer of the RUL in January 2022 s/p lobectomy.  She is post multiple rounds of chemotherapy.  In November 2022 PET scan revealed right scapular lesion status post radiation therapy. Liver biopsy in September 2023 revealed metastatic cancer with neuroendocrine features.  This was followed by four other runs of chemotherapy ending in August 2024.  Two new brain lesions were found in 04/2023. She was referred to palliative care and more recently Hospice services.   Patient lives at home with a "friend"/significant other, her sister niece and son are all involved in her care.  Per report, pt appears to have been declining over the last month but more significantly over the last week.   CHest CT: No acute airspace disease or pleural  effusion. Progression of pulmonary metastatic disease. Dominant  medial left lung base pulmonary mass measures 5.5 x 3 cm compared   with 2.7 x 1.8 cm previously. Peripheral right lower lobe index  pulmonary nodule measures 4 by 2.7 cm on series 7, previously 2.3 x 1.7 cm. Enlargement of numerous additional pulmonary nodules. Right middle lobe collapse. Status post right upper lobectomy. Musculoskeletal: Right scapular metastatic lesion with involvement of adjacent muscle is noted.  Imaging concerning for enlarging brain masses, 1 mass has doubled in size since MRI in July 2024. Type of Study: Bedside Swallow Evaluation Previous Swallow Assessment: none Diet Prior to this Study: NPO Temperature Spikes Noted: No (wbc 8.0) Respiratory Status: Nasal cannula (4L) History of Recent Intubation: No Behavior/Cognition: Alert;Cooperative;Pleasant mood;Distractible;Requires cueing Oral Cavity Assessment: Dry (min) Oral Care Completed by SLP: Yes Oral Cavity - Dentition: Edentulous Vision: Functional for self-feeding Self-Feeding Abilities: Able to feed self;Needs assist;Needs set up;Total assist (weak but could hold cup) Patient Positioning: Upright in bed (needed full support) Baseline Vocal Quality: Low vocal intensity Volitional Cough: Congested;Weak Volitional Swallow: Able to elicit    Oral/Motor/Sensory Function Overall Oral Motor/Sensory Function: Within functional limits   Ice Chips Ice chips: Not tested   Thin Liquid Thin Liquid: Within functional limits Presentation: Straw;Self Fed (supported; 9 trials)    Nectar Thick Nectar Thick Liquid: Not tested   Honey Thick Honey  Thick Liquid: Not tested   Puree Puree: Within functional limits Presentation: Spoon (fed; 3 trials) Other Comments: declined more   Solid     Solid: Not tested         Jerilynn Som, MS, CCC-SLP Speech Language Pathologist Rehab Services; [facility] [phone] ([phone]) Emilee Market 08/24/2023,4:03 PM

## 2023-08-24 NOTE — Progress Notes (Addendum)
NAME:  Stacie Kennedy, MRN:  [id number], DOB:  January 25, 1961, LOS: 1 ADMISSION DATE:  08/23/2023, CONSULTATION DATE:  08/23/23 REFERRING MD:  Dr. Rosalia Hammers, CHIEF COMPLAINT:  Leg swelling   History of Present Illness:  62 yo F presenting to [facility] ED via EMS from home on 08/23/23 for evaluation of leg swelling and back pain from a sacral wound.  History obtained per chart review, patient bedside report and conversation with sister and niece. 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  Per oncology notation patient's initial diagnosis of primary cancer of the RUL in January 2022 s/p lobectomy.  She is post multiple rounds of chemotherapy.  In November 2022 PET scan revealed right scapular lesion status post radiation therapy. Liver biopsy in September 2023 revealed metastatic cancer with neuroendocrine features.  This was followed by four other runs of chemotherapy ending in August 2024.  Two new brain lesions were found in 04/2023. She was referred to palliative care and more recently hospice.  Patient lives at home with a "friend"/significant other, her sister niece and son are all involved in her care.  Patient interactive with persistence during bedside interview, but only with brief answers and not interactive during goals of care conversation.  Sister and niece describe that she appears to have been declining over the last month but more significantly over the last week.  The patient has had exertional dyspnea with increasing weakness and has been less able to get up and move around regularly over the last 3 days.  They have also noticed increasing BLE edema.  They confirm complaints of abdominal/back and buttocks pain. They report the patient has not been eating the past two days, even though the patient reports eating "some cereal yesterday". They deny recent falls, chest pain, fever/ chills, vomiting/ diarrhea. She has a 10.8 pack year history and is still  actively smoking. Family denies any recent ETOH use, and denies recreational drug use. Most recent documentation from oncology has the patient prescribed OxyContin 60 mg BID and Oxycodone IR 15 mg 4 times daily. Her sister states she is no longer taking the 60 mg BID only 15 mg oxycodone 4 times a day. She does not wear oxygen at home.  ED course: Upon arrival, patient with flat affect, but responsive with soft BP and hypoxia requiring 5 L [place] support. Labs significant for hypoglycemia, AKI, slightly improved Alk phos, but elevated T. Bili & ALT, BNP & INR also elevated with mild anemia close to baseline. Imaging concerning for enlarging brain masses, 1 mass has doubled in size since MRI in July 2024 and second mass has quadrupled in size with mass effect and right temporal lobe.  CT angio revealed small segmental and subsegmental pulmonary emboli, with progression of metastatic disease noted throughout CT abdomen and pelvis. BP has remained soft with SBP in the 80's while MAP's >/= 65. Medications given: fentanyl, D10W bolus, 1 L NS bolus, IV contrast Initial Vitals: 99, 19, 84, 90/60 & 90% on 2 L [place] Significant labs: (Labs/ Imaging personally reviewed) I, Cheryll Cockayne Rust-Chester, AGACNP-BC, personally viewed and interpreted this ECG. EKG Interpretation: Date: 08/23/2023, EKG Time: 14:30, Rate: 89, Rhythm: NSR, QRS Axis: Normal, Intervals: LAE & probable LVH, ST/T Wave abnormalities: None, Narrative Interpretation: NSR with probable LVH and LAE Chemistry: Na+:129, K+: 4.6, BUN/Cr.: 34/1.32, Serum CO2/ AG: 24/15, Cl: 90, Alk phos: 162, albumin: 2.6, AST/ALT: 96/31, T.Bili: 1.6 Hematology: WBC: 8.6, Hgb: 9.5,  Troponin: Pending, BNP:  281.3, Lactic/ PCT: Pending, COVID-19: pending  CXR 08/23/23: emphysema with numerous bilateral pulmonary masses consistent with metastatic disease, appears increased compared with CT from august CT head wo contrast 08/23/23: Marked enlargement of a metastatic lesion  affecting the calvarium at the lateral margin of the middle cranial fossa on the right. Previously, this lesion measured up to 2 cm in thickness, with both external and internal soft tissue component. Now, the thickness is up to 4.3 cm. This bulges much more into the lateral aspect of the middle cranial fossa and has some mass-effect upon the right temporal lobe. Second bone lesion previously seen in the occiput put has grown extensively. In July, this only measured a cm in size. Presently, the infiltrating bone component measures up to 4.4 cm, with tumor bulging external to the calvarium and inward towards the brain. There could be involvement of the superior sagittal sinus. Maximal thickness of tumor is 2.5 cm. No third calvarial lesion is seen. 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 CT angio PE 08/23/23: Small nonocclusive right segmental and subsegmental pulmonary emboli in this patient is status post right upper lobectomy. Progression of pulmonary metastatic disease. Right scapular metastatic lesion with involvement of adjacent muscle is again noted. CT abdomen/pelvis w contrast 08/23/23: Progression of hepatic metastatic disease. Interim development or enlargement of multiple right renal and perirenal/retroperitoneal masses consistent with metastatic disease. Increased lobulated mass in the left gluteal subcutaneous soft tissues consistent with metastatic lesion. Increased size and heterogeneity of previously noted left adrenal adenoma, now contiguous with solid enhancing mass and concerning for metastatic disease. Ill-defined bilateral foci of hypoenhancement within both kidneys on delayed views, question pyelonephritis versus metastatic disease. Extensive subcutaneous edema consistent with anasarca. Incompletely visualized low-density appearance of left abductor muscles which could be due to nonspecific muscle edema or inflammatory process. Aortic atherosclerosis.  PCCM consulted  for admission due to hypotension and small pulmonary emboli with probable need for vasopressor support in the setting of extensive metastatic disease with high risk for decline.  Pertinent  Medical History  Stage IV NSCLC with mets Depression HLD Pre-diabetes Current everyday smoker  Micro Data:   11/12: MRSA PCR>>negative  11/13: COVID>>negative  11/13: Urine>>  Anti-infectives (From admission, onward)    Start     Dose/Rate Route Frequency Ordered Stop   08/24/23 0930  cefTRIAXone (ROCEPHIN) 2 g in sodium chloride 0.9 % 100 mL IVPB        2 g 200 mL/hr over 30 Minutes Intravenous Every 24 hours 08/24/23 0844        Significant Hospital Events: Including procedures, antibiotic start and stop dates in addition to other pertinent events   08/23/23: Admit to ICU with metastatic NSCLC and hypotension in the setting of small PE with high risk of decline 08/24/23: Pt currently requiring levophed gtt @10  mcg/min to maintain map 65 or higher.     Interim History / Subjective:  She is currently on 3L O2 via nasal canula denies shortness of breath.  She denies pain although with abdominal palpation she does grimace   Objective   Blood pressure (!) 105/54, pulse 72, temperature 98.2 F (36.8 C), temperature source Oral, resp. rate 17, height 5' 7.5" (1.715 m), weight 62.9 kg, SpO2 99%.        Intake/Output Summary (Last 24 hours) at 08/24/2023 0744 Last data filed at 08/24/2023 0700 Gross per 24 hour  Intake 984.7 ml  Output 250 ml  Net 734.7 ml   [employer]  08/23/23 2028 08/23/23 2224 08/24/23 0307  Weight: 47 kg 62.9 kg 62.9 kg    Examination: General: Acute on chronically-ill cachetic appearing female, NAD on 6L  HEENT: Anicteric, atraumatic, neck supple Neuro: Alert and oriented, following commands, PERRLA  CV: NSR, s1s2, no m/r/g, 2+ radial/1+ distal pulses, 3+ BLE edema  Pulm: Regular, non labored on 4-5L [place], breath sounds coarse rhonchi-BUL &  diminished-BLL GI: Hypoactive BS x4, firm, distended, tender  Skin: Sacral pressure injury noted on admission  Extremities: Warm/dry  [facility] Problem list     Assessment & Plan:  #Acute hypoxic respiratory failure suspect multifocal secondary to small RIGHT segmental & subsegmental pulmonary emboli in the setting of Metastatic NSCLC #Current everyday smoker #Small RIGHT segmental and subsegmental pulmonary emboli - Supplemental O2 to maintain SpO2 > 90%, initiate HHFNC PRN - Will hold systemic anticoagulation at this time due to to enlarging brain masses with high risk for hemorrhage - Intermittent chest x-ray & ABG PRN - Ensure adequate pulmonary hygiene  - Duo nebs TID, bronchodilators PRN - SLP consulted to evaluate for aspiration risk - Smoking cessation counseling when appropriate  #Hypotension suspect multifocal secondary to pain medication & hypovolemia/dehydration low suspicion for sepsis  #Elevated BNP - Consider levophed administration PRN, wean as tolerated to maintain MAP > 65 - Holding additional IVF at this time due to pulmonary status & elevated BNP - Troponin peaked 18  - Echo pending  - Will check cortisol level to r/o adrenal insufficiency  - Venous US Bilateral Lower Extremity pending to r/o VTE   #Acute Kidney Injury suspect prerenal secondary to hypovolemia and dehydration #Hyponatremia suspect secondary to volume depletion  Baseline Cr: 0.47, Cr on admission: 1.32 - Strict I/O's: alert provider if UOP < 0.5 mL/kg/hr - Gentle IVF hydration, 1 L IVF resuscitation given > due to patient's respiratory status will hold off on additional IVF at this time - Daily BMP - Replace electrolytes as indicated  - Avoid nephrotoxic agents as able, ensure adequate renal perfusion  #Possible UTI  - Trend WBC and monitor fever curve  - Trend PCT - Follow cultures  - Will start ceftriaxone  #Stage IV NSCLC with metastases #Chronic Pain - Scheduled lidocaine  patches and prn oxycodone or fentanyl for pain management  - Supportive care with offloading devices  #Sacral Pressure Injury: present on admission  - Turn patient q2h, utilize offloading devices PRN - Consult to WOC  #Severe protein calorie malnutrition  - Speech therapy consulted appreciate input  - Dietitian consulted appreciate input   #Pre-Diabetes #Hypoglycemia Hemoglobin A1C 08/24/23: 5.2 - Monitor CBG Q 4 hours - Continue D5NS if patient remains hypoglycemic - Target range while in ICU: 140-180 - Follow ICU hyper/hypo-glycemia protocol - Hold outpatient metformin   Best Practice (right click and "Reselect all SmartList Selections" daily)  Diet/type: NPO pending speech evaluation  DVT prophylaxis: LMWH GI prophylaxis: PPI Lines: yes and it is still needed PORT in place present on admission  Foley:  N/A Code Status:  full code Last date of multidisciplinary goals of care discussion [08/24/23]  Pt with poor prognosis per Oncology notes pt no longer on treatment due to progression of disease.  She is no longer a candidate for treatment.  Pt is HIGH risk for cardiac/respiratory arrest and sudden death.  Recommend changing code status to DNI/DNR.  Attempting to set up a family meeting today to discuss code status and goals of treatment.  Palliative Care consulted.   11/13: Updated pt regarding  plan of care although pt has a flat affect and minimally engaging in conversation  Labs   CBC: Recent Labs  Lab 08/23/23 1431 08/24/23 0147  WBC 8.6 8.0  NEUTROABS 7.4  --   HGB 9.5* 8.1*  HCT 27.9* 24.8*  MCV 87.2 87.9  PLT 242 210    Basic Metabolic Panel: Recent Labs  Lab 08/23/23 1431 08/23/23 2311 08/24/23 0147 08/24/23 0518  NA 129*  --  132*  --   K 4.6  --  4.1  --   CL 90*  --  93*  --   CO2 24  --  22  --   GLUCOSE 62*  --  80  --   BUN 34*  --  32*  --   CREATININE 1.32*  --  1.02*  --   CALCIUM 7.8*  --  7.8*  --   MG  --  1.4* 1.3* 1.9  PHOS  --  3.5  3.2  --    GFR: Estimated Creatinine Clearance: 56.7 mL/min (A) (by C-G formula based on SCr of 1.02 mg/dL (H)). Recent Labs  Lab 08/23/23 1431 08/23/23 2311 08/24/23 0147 08/24/23 0518  PROCALCITON  --  0.65 0.61 0.55  WBC 8.6  --  8.0  --   LATICACIDVEN  --  2.0* 2.0* 1.8    Liver Function Tests: Recent Labs  Lab 08/23/23 1431 08/24/23 0147  AST 96* 69*  ALT 31 26  ALKPHOS 162* 126  BILITOT 1.6* 1.4*  PROT 6.4* 5.6*  ALBUMIN 2.6* 2.3*   No results for input(s): "LIPASE", "AMYLASE" in the last 168 hours. No results for input(s): "AMMONIA" in the last 168 hours.  ABG    Component Value Date/Time   PHART 7.413 09/17/2020 1201   PCO2ART 33.9 09/17/2020 1201   PO2ART 75.5 (L) 09/17/2020 1201   HCO3 21.2 09/17/2020 1201   ACIDBASEDEF 2.7 (H) 09/17/2020 1201   O2SAT 96.0 09/17/2020 1201     Coagulation Profile: Recent Labs  Lab 08/23/23 2042  INR 1.6*    Cardiac Enzymes: No results for input(s): "CKTOTAL", "CKMB", "CKMBINDEX", "TROPONINI" in the last 168 hours.  HbA1C: Hgb A1c MFr Bld  Date/Time Value Ref Range Status  08/24/2023 01:47 AM 5.2 4.8 - 5.6 % Final    Comment:    (NOTE) Pre diabetes:          5.7%-6.4%  Diabetes:              >6.4%  Glycemic control for   <7.0% adults with diabetes   11/26/2020 12:14 PM 6.2 (H) 4.8 - 5.6 % Final    Comment:             Prediabetes: 5.7 - 6.4          Diabetes: >6.4          Glycemic control for adults with diabetes: <7.0     CBG: Recent Labs  Lab 08/23/23 2224 08/23/23 2357 08/24/23 0359 08/24/23 0732  GLUCAP 95 69* 90 88    Review of Systems: Positives in BOLD  Gen: Denies fever, chills, weight change, fatigue, poor PO intake, night sweats HEENT: Denies blurred vision, double vision, hearing loss, tinnitus, sinus congestion, rhinorrhea, sore throat, neck stiffness, dysphagia PULM: Denies shortness of breath, cough, sputum production, hemoptysis, wheezing CV: Denies chest pain, edema,  orthopnea, paroxysmal nocturnal dyspnea, palpitations GI: Denies abdominal pain, nausea, vomiting, diarrhea, hematochezia, melena, constipation, change in bowel habits GU: Denies dysuria, hematuria, polyuria, oliguria, urethral discharge Endocrine:  Denies hot or cold intolerance, polyuria, polyphagia or appetite change Derm: Denies rash, dry skin, scaling or peeling skin change Heme: Denies easy bruising, bleeding, bleeding gums Neuro: Denies headache, numbness, weakness, slurred speech, loss of memory or consciousness  Past Medical History:  She,  has a past medical history of Cancer (HCC), Cancer associated pain, Depression, Duodenitis, Dyspnea, Family history of cancer, High cholesterol, Personal history of colonic polyps, Pre-diabetes, Umbilical hernia, and UTI (urinary tract infection).   Surgical History:   Past Surgical History:  Procedure Laterality Date   COLONOSCOPY WITH PROPOFOL N/A 03/24/2021   Procedure: COLONOSCOPY WITH PROPOFOL;  Surgeon: Wyline Mood, MD;  Location: [facility] ENDOSCOPY;  Service: Gastroenterology;  Laterality: N/A;   INTERCOSTAL NERVE BLOCK  09/19/2020   Procedure: INTERCOSTAL NERVE BLOCK;  Surgeon: Loreli Slot, MD;  Location: [facility] OR;  Service: Thoracic;;   IR IMAGING GUIDED PORT INSERTION  09/23/2021   LUNG LOBECTOMY Right    LUNG REMOVAL, PARTIAL Right 09/19/2020   NODE DISSECTION Right 09/19/2020   Procedure: NODE DISSECTION;  Surgeon: Loreli Slot, MD;  Location: [facility] OR;  Service: Thoracic;  Laterality: Right;   VIDEO BRONCHOSCOPY N/A 07/08/2021   Procedure: VIDEO BRONCHOSCOPY;  Surgeon: Loreli Slot, MD;  Location: [facility] OR;  Service: Thoracic;  Laterality: N/A;     Social History:   reports that she has been smoking cigarettes. She has a 10.8 pack-year smoking history. She has never used smokeless tobacco. She reports current alcohol use of about 2.0 standard drinks of alcohol per week. She reports that she does not use drugs.    Family History:  Her family history includes Cancer in her brother and mother; Diabetes in her brother, father, and mother; Healthy in her sister; Heart attack in her father; Heart disease in her brother; Hepatitis in her brother; Hypertension in her brother; Stroke in her father.   Allergies No Known Allergies   Home Medications  Prior to Admission medications   Medication Sig Start Date End Date Taking? Authorizing Provider  albuterol (PROVENTIL HFA) 108 (90 Base) MCG/ACT inhaler Inhale 2 puffs into the lungs once every 6 (six) hours as needed for wheezing or shortness of breath. 07/27/23   Earna Coder, MD  DULoxetine (CYMBALTA) 30 MG capsule Take 1 capsule (30 mg total) by mouth daily. For nausea, appetite, sleep 04/18/23   Alinda Dooms, NP  lidocaine-prilocaine (EMLA) cream Apply one application topically the the affected area(s) daily as needed. 05/16/23   Earna Coder, MD  loratadine (CLARITIN) 10 MG tablet Take 1 tablet (10 mg total) by mouth daily. 10/16/21   Earna Coder, MD  metFORMIN (GLUCOPHAGE) 500 MG tablet Take 1 tablet (500 mg total) by mouth daily with breakfast. 03/04/23   Earna Coder, MD  naloxone [facility]) nasal spray 4 mg/0.1 mL SPRAY 1 SPRAY INTO ONE NOSTRIL AS DIRECTED FOR OPIOID OVERDOSE (TURN PERSON ON SIDE AFTER DOSE. IF NO RESPONSE IN 2-3 MINUTES OR PERSON RESPONDS BUT RELAPSES, REPEAT USING A NEW SPRAY DEVICE AND SPRAY INTO THE OTHER NOSTRIL. CALL 911 AFTER USE.) * EMERGENCY USE ONLY * Patient not taking: Reported on 07/26/2023 07/29/22   Borders, Daryl Eastern, NP  OLANZapine (ZYPREXA) 5 MG tablet Take 1 tablet (5 mg total) by mouth at bedtime. 05/16/23   Earna Coder, MD  ondansetron (ZOFRAN) 4 MG tablet TAKE 2 TABLETS (8MG ) BY MOUTH ONCE EVERY 8 HOURS AS NEEDED FOR NAUSEA OR VOMITING. 05/16/23   Earna Coder, MD  oxyCODONE (  OXYCONTIN) 60 MG 12 hr tablet Take 1 tablet by mouth every 12 (twelve) hours. 08/05/23   Borders,  Daryl Eastern, NP  oxyCODONE (ROXICODONE) 15 MG immediate release tablet Take 1-2 tablets (15-30 mg total) by mouth every 4 (four) hours as needed for pain. 08/05/23   Borders, Daryl Eastern, NP  prochlorperazine (COMPAZINE) 10 MG tablet Take 1 tablet (10 mg total) by mouth once every 6 (six) hours as needed for nausea or vomiting. 08/31/21   Earna Coder, MD  psyllium (METAMUCIL) 58.6 % packet Take 1 packet by mouth daily.    [provider]     Critical care time: 56 minutes    Zada Girt, AGNP  Pulmonary/Critical Care Pager [phone] (please enter 7 digits) PCCM Consult Pager [phone] (please enter 7 digits)

## 2023-08-24 NOTE — Progress Notes (Signed)
[facility] Patient Visit  [name] is a current hospice patient, with hospice diagnosis of malignant neoplasm of upper lobe, right bronchus or lung. She was admitted 08/23/23 with diagnosis of respiratory failure with pulmonary embolus and metastatic lung cancer. [facility] was notified by Misty Stanley, her sister that patient was enroute to ED. Per Dr. Patric Dykes, hospice physician, this is a related hospital admission.   Visited with patient in hospital. She appears uncomfortable but does not verbalize distress. Was a part of GOC discussion with sister, niece, and son facilitated by Dr. Aundria Rud, Sharia Reeve Borders, and Zada Girt. Discussed options at length and family has decided to make patient DNR/DNI and request comfort care. They report that patient did not want to go to the Hospice Home. Discussed option to take patient home with hospice services and family decided to keep patient in hospital for now with goal of improved pain/symptom management.   Patient is GIP appropriate due to need for IV fluids, electrolyte replacement, and IV pain medication.  Vital Signs: 97.8/84/19    89/54    92% on 4 LPM HFNC  I&O: 984/250  Abnormal labs: Na 129, BUN 34, Cre 1.32, Ca 7.8, Alb 2.6, t.bili 1.6, GFR 46, BNP 281, HGB 9.5, PT 18.8  Diagnostics: CT Head: IMPRESSION: 1. Marked enlargement of a metastatic lesion affecting the calvarium at the lateral margin of the middle cranial fossa on the right. Previously, this lesion measured up to 2 cm in thickness, with both external and internal soft tissue component. Now, the thickness is up to 4.3 cm. This bulges much more into the lateral aspect of the middle cranial fossa and has some mass-effect upon the right temporal lobe. 2. Second bone lesion previously seen in the occiput put has grown extensively. In July, this only measured a cm in size. Presently, the infiltrating bone component measures  up to 4.4 cm, with tumor bulging external to the calvarium and inward towards the brain. There could be involvement of the superior sagittal sinus. Maximal thickness of tumor is 2.5 cm. 3. No third calvarial lesion is seen. 4. 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   CT chest/abd/pelvis: IMPRESSION: CHEST CT:   1. Small nonocclusive right segmental and subsegmental pulmonary emboli in this patient is status post right upper lobectomy. 2. Progression of pulmonary metastatic disease. 3. Right scapular metastatic lesion with involvement of adjacent muscle is again noted.   ABDOMEN AND PELVIS CT:   1. Progression of hepatic metastatic disease. 2. Interim development or enlargement of multiple right renal and perirenal/retroperitoneal masses consistent with metastatic disease. 3. Increased lobulated mass in the left gluteal subcutaneous soft tissues consistent with metastatic lesion. Increased size and heterogeneity of previously noted left adrenal adenoma, now contiguous with solid enhancing mass and concerning for metastatic disease. 4. Ill-defined bilateral foci of hypoenhancement within both kidneys on delayed views, question pyelonephritis versus metastatic disease. 5. Extensive subcutaneous edema consistent with anasarca. Incompletely visualized low-density appearance of left abductor muscles which could be due to nonspecific muscle edema or inflammatory process. 6. Aortic atherosclerosis.  IV/PRN Meds: NS IV x 1, Levophed infusion, Mag Sulphate 2G IV x 2, D10 x 2, Rocephin 2G IV x 1, NS at 146ml/hr, fentanyl IV x 3  Problem List:  Acute Hypoxic Respiratory Failure suspect multifocal secondary to small RIGHT segmental & subsegmental pulmonary emboli in the setting of Metastatic NSCLC Current Everyday smoker Small RIGHT segmental and subsegmental pulmonary  emboli - Supplemental O2 to maintain SpO2 > 90%, initiate HHFNC  PRN -Echocardiogram ordered -Trend troponin -Will hold systemic anticoagulation at this time due to to enlarging brain masses with high risk for hemorrhage - Intermittent chest x-ray & ABG PRN - Ensure adequate pulmonary hygiene  - Covid test, trend PCT - consider empiric antibiotics depending on PCT trend - Duo nebs TID, bronchodilators PRN - SLP consulted to evaluate for aspiration risk - smoking cessation counseling when appropriate   Hypotension suspect multifocal secondary to pain medication & hypovolemia/dehydration +/- sepsis Elevated BNP - consider levophed administration PRN, wean as tolerated to maintain MAP > 65 - holding additional IVF at this time due to pulmonary status & elevated BNP - f/u Echocardiogram    Acute Kidney Injury suspect prerenal secondary to hypovolemia and dehydration Hyponatremia Baseline Cr: 0.47, Cr on admission:1.32 - Strict I/O's: alert provider if UOP < 0.5 mL/kg/hr - gentle IVF hydration, 1 L IVF resuscitation given > due to patient's respiratory status will hold off on additional IVF at this time - Daily BMP, replace electrolytes PRN - Avoid nephrotoxic agents as able, ensure adequate renal perfusion - monitor Sodium, suspect will improve with hydration   Stage IV NSCLC with metastases Chronic Pain - fentanyl 50 mcg Q 3 H PRN - lidocaine patches for back - supportive care with offloading devices - hold outpatient oxycodone at this time, consider restarting if patient's hemodynamics stabilize   Sacral Pressure Injury - PTA - turn patient Q 2 h, utilize offloading devices PRN - consult to WOC - consider dietary consult depending on SLP eval     Discharge Planning: Ongoing  Family Contact: sister, son and niece at bedside  IDT: Updated  Goals of Care: Was Full code, changed to DNR/DNI (see above)  Glenna Fellows BSN, RN, [facility] hospital liaison [phone]

## 2023-08-24 NOTE — Consult Note (Signed)
Palliative Medicine [facility] at [facility] Telephone:([phone]) [phone] Fax:([phone]) [phone]   Name: Stacie Kennedy Date: 08/24/2023 MRN: [id number]  DOB: 01/25/1961  Patient Care Team: Earna Coder, MD as PCP - General (Internal Medicine) Glory Buff, RN as Oncology Nurse Navigator    REASON FOR CONSULTATION: Stacie Kennedy is a 62 y.o. 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  Palliative care was consulted to address goals.  SOCIAL HISTORY:     reports that she has been smoking cigarettes. She has a 10.8 pack-year smoking history. She has never used smokeless tobacco. She reports current alcohol use of about 2.0 standard drinks of alcohol per week. She reports that she does not use drugs.  Patient has a son and sister who are involved in her care.  ADVANCE DIRECTIVES:  Does not have  CODE STATUS: DNR  PAST MEDICAL HISTORY: Past Medical History:  Diagnosis Date   Cancer (HCC)    lung cancer   Cancer associated pain    Depression    Duodenitis    Dyspnea    Family history of cancer    High cholesterol    Personal history of colonic polyps    Pre-diabetes    Umbilical hernia    UTI (urinary tract infection)     PAST SURGICAL HISTORY:  Past Surgical History:  Procedure Laterality Date   COLONOSCOPY WITH PROPOFOL N/A 03/24/2021   Procedure: COLONOSCOPY WITH PROPOFOL;  Surgeon: Wyline Mood, MD;  Location: [facility] ENDOSCOPY;  Service: Gastroenterology;  Laterality: N/A;   INTERCOSTAL NERVE BLOCK  09/19/2020   Procedure: INTERCOSTAL NERVE BLOCK;  Surgeon: Loreli Slot, MD;  Location: [facility] OR;  Service: Thoracic;;   IR IMAGING GUIDED PORT INSERTION  09/23/2021   LUNG LOBECTOMY Right    LUNG REMOVAL, PARTIAL Right 09/19/2020   NODE DISSECTION Right  09/19/2020   Procedure: NODE DISSECTION;  Surgeon: Loreli Slot, MD;  Location: [facility] OR;  Service: Thoracic;  Laterality: Right;   VIDEO BRONCHOSCOPY N/A 07/08/2021   Procedure: VIDEO BRONCHOSCOPY;  Surgeon: Loreli Slot, MD;  Location: [facility] OR;  Service: Thoracic;  Laterality: N/A;    HEMATOLOGY/ONCOLOGY HISTORY:  Oncology History Overview Note  # NOV -[id number] [LUNG CANCER SCREENING PROGRAM]-18 mm right upper lobe lung nodule; DEC 2021- s/p right upper lobectomy [Dr. Dorris Fetch; [place]]; STAGE: I [pT-18 mm; LN-12=0]; predominant large cell neuroendocrine; minor adenocarcinoma.  DeclineD adjuvant chemotherapy.  #Recurrent/stage IV cancer NOV 2022- PET scan-scapular lesion liver lesion gastrohepatic lymphadenopathy.  11/21- start RT to right scapular lesion  # DEC 3rd, 2022- CARBO=ETOP+TECEN; udenyca #1; SEP 2023- STOP Tencetriq- progression  SEP 2023-liver biopsy shows neuroendocrine carcinoma/large cell; no adenocarcinoma noted.  Discontinue Tecentriq any progression of disease.  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   # OCTOBER, 2023-s /p carboplatin etoposide x4 cycles- without immunotherapy [progressed while on Atezo].  CT scan FEB 1st, 2024- Two new solid pulmonary nodules, largest 0.8 cm in the posterior left upper lobe, suspicious for new pulmonary metastases. Two enlarging left liver metastases, including substantial growth of the dominant bulky 9.7 cm segment 3 left liver metastasis. Stable expansile mixed lytic and sclerotic right scapular body metastasis.  # FEB 2024- Lubrinectidin cycle #3-progressive disease-noted; discontinue Lubrinectidin.   # MAY 3rd, 2024- IPI-1+NIVO-3 [KEPASAKE]- cycle #1- CT JUNE 27th-  CT CAP-  Multiple new and enlarged bilateral pulmonary nodules, consistent with worsened pulmonary metastatic disease; Continued interval enlargement of very bulky metastases occupying the left lobe of the liver. Unchanged mixed  lytic and sclerotic osseous metastatic lesion of the right scapular body.  DISCONTINUE IPI+ NIVO given the progressive disease.  # JULY 8th, 2024- Gemcitabine x2 ccyles- AUG 14th CT CAP- PROGRESSIVE.   # # MAY 2023- Right swollen eye/orbital inflammation/uveitis s/p Zometa status post steroids improved.-reviewed literature case reports noted; less concerning for tumor involvement.  DISCONTINUE ZOMETA.  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  Visceral Pleura Invasion: Not identified  Direct Invasion of Adjacent Structures: No adjacent structures present  Lymphovascular Invasion: Not identified  Margins: All margins negative for invasive carcinoma       Closest Margin(s) to Invasive Carcinoma: Bronchovascular margin  Treatment Effect: No known presurgical therapy  Regional Lymph Nodes:       Number of Lymph Nodes Involved: 0                            Nodal Sites with Tumor: Not applicable       Number of Lymph Nodes Examined: 12      Primary cancer of right upper lobe of lung (HCC)  11/03/2020 Initial Diagnosis   Primary cancer of right upper lobe of lung (HCC)   11/03/2020 Cancer Staging   Staging form: Lung, AJCC 8th Edition - Pathologic: Stage IA3 (pT1c, pN0, cM0) - Signed by Earna Coder, MD on 11/04/2020   09/14/2021 - 04/30/2022 Chemotherapy   Patient is on Treatment Plan : LUNG SCLC Carboplatin + Etoposide + Atezolizumab Induction q21d / Atezolizumab Maintenance q21d     09/15/2021 - 06/18/2022 Chemotherapy   Patient is on Treatment Plan : LUNG SCLC Carboplatin + Etoposide + Atezolizumab Induction q21d x 4 cycles / Atezolizumab  Maintenance q21d     11/09/2021 Cancer Staging   Staging form: Lung, AJCC 8th Edition - Pathologic: Stage IVB (pTX, pNX, cM1c) - Signed by Earna Coder, MD on 11/09/2021   06/22/2022 Pathology Results   Liver biopsy showed metastatic cancer with neuroendocrine features    08/09/2022 - 10/22/2022 Chemotherapy   Patient is on Treatment Plan : BRAIN Carboplatin (AUC 5) D1 + Etoposide (120) D1-3 q28d     11/26/2022 - 01/10/2023 Chemotherapy   Patient is on Treatment Plan : LUNG SMALL CELL Lurbinectedin q21d     02/11/2023 - 03/25/2023 Chemotherapy   Patient is on Treatment Plan : MELANOMA Nivolumab (3) + Ipilimumab (1) q21d / Nivolumab (240) q14d     04/18/2023 - 05/16/2023 Chemotherapy   Patient is on Treatment Plan : LUNG Gemcitabine (1000) D1,8 q21d       ALLERGIES:  has No Known Allergies.  MEDICATIONS:  Current Facility-Administered Medications  Medication Dose Route Frequency Provider Last Rate Last Admin   Chlorhexidine Gluconate Cloth 2 % PADS 6 each  6 each Topical Daily Rust-Chester, Cecelia Byars, NP   6 each at 08/24/23 0806   docusate sodium (COLACE) capsule 100 mg  100 mg Oral BID PRN Rust-Chester, Cecelia Byars, NP  glycopyrrolate (ROBINUL) tablet 1 mg  1 mg Oral Q4H PRN Raechel Chute, MD       Or   glycopyrrolate (ROBINUL) injection 0.2 mg  0.2 mg Subcutaneous Q4H PRN Raechel Chute, MD       Or   glycopyrrolate (ROBINUL) injection 0.2 mg  0.2 mg Intravenous Q4H PRN Raechel Chute, MD       HYDROmorphone (DILAUDID) 50 mg in 50 mL NS (1mg /mL) premix infusion  0-4 mg/hr Intravenous Continuous Raechel Chute, MD 1 mL/hr at 08/24/23 1700 1 mg/hr at 08/24/23 1700   HYDROmorphone (DILAUDID) bolus via infusion 1 mg  1 mg Intravenous Q15 min PRN Raechel Chute, MD       HYDROmorphone (DILAUDID) injection 1-2 mg  1-2 mg Intravenous Q2H PRN Ezequiel Essex, NP   1 mg at 08/24/23 1148   ipratropium-albuterol (DUONEB) 0.5-2.5 (3) MG/3ML nebulizer solution 3 mL  3 mL Nebulization Q4H PRN  Rust-Chester, Cecelia Byars, NP       leptospermum manuka honey (MEDIHONEY) paste 1 Application  1 Application Topical Daily Raechel Chute, MD   1 Application at 08/24/23 1134   lidocaine (LIDODERM) 5 % 2 patch  2 patch Transdermal Q24H Rust-Chester, Cecelia Byars, NP   2 patch at 08/23/23 2251   LORazepam (ATIVAN) injection 2-4 mg  2-4 mg Intravenous Q4H PRN Raechel Chute, MD       Oral care mouth rinse  15 mL Mouth Rinse PRN Rust-Chester, Micheline Rough L, NP       oxyCODONE (Oxy IR/ROXICODONE) immediate release tablet 15-30 mg  15-30 mg Oral Q4H PRN Ezequiel Essex, NP   15 mg at 08/24/23 1040   polyethylene glycol (MIRALAX / GLYCOLAX) packet 17 g  17 g Oral Daily PRN Rust-Chester, Cecelia Byars, NP       polyvinyl alcohol (LIQUIFILM TEARS) 1.4 % ophthalmic solution 1 drop  1 drop Both Eyes QID PRN Raechel Chute, MD       sodium chloride flush (NS) 0.9 % injection 10-40 mL  10-40 mL Intracatheter Q12H Dgayli, Lianne Bushy, MD   10 mL at 08/24/23 0925   sodium chloride flush (NS) 0.9 % injection 10-40 mL  10-40 mL Intracatheter PRN Raechel Chute, MD       Facility-Administered Medications Ordered in Other Encounters  Medication Dose Route Frequency Provider Last Rate Last Admin   heparin lock flush 100 UNIT/ML injection            heparin lock flush 100 unit/mL  500 Units Intravenous Once Louretta Shorten R, MD       morphine (PF) 2 MG/ML injection            sodium chloride flush (NS) 0.9 % injection 10 mL  10 mL Intravenous PRN Earna Coder, MD   10 mL at 03/01/22 0857    VITAL SIGNS: BP (!) 97/52   Pulse (!) 102   Temp 97.8 F (36.6 C) (Oral)   Resp 16   Ht 5' 7.5" (1.715 m)   Wt 138 lb 10.7 oz (62.9 kg)   SpO2 91%   BMI 21.40 kg/m  Filed Weights   08/23/23 2028 08/23/23 2224 08/24/23 0307  Weight: 103 lb 9.9 oz (47 kg) 138 lb 10.7 oz (62.9 kg) 138 lb 10.7 oz (62.9 kg)    Estimated body mass index is 21.4 kg/m as calculated from the following:   Height as of this encounter: 5' 7.5"  (1.715 m).   Weight as of this encounter: 138 lb 10.7 oz (62.9  kg).  LABS: CBC:    Component Value Date/Time   WBC 8.0 08/24/2023 0147   HGB 8.1 (L) 08/24/2023 0147   HGB 10.0 (L) 06/30/2023 1004   HGB 15.5 11/26/2020 1214   HCT 24.8 (L) 08/24/2023 0147   HCT 47.6 (H) 11/26/2020 1214   PLT 210 08/24/2023 0147   PLT 273 06/30/2023 1004   PLT 317 11/26/2020 1214   MCV 87.9 08/24/2023 0147   MCV 87 11/26/2020 1214   NEUTROABS 7.4 08/23/2023 1431   NEUTROABS 2.5 11/26/2020 1214   LYMPHSABS 0.5 (L) 08/23/2023 1431   LYMPHSABS 3.6 (H) 11/26/2020 1214   MONOABS 0.6 08/23/2023 1431   EOSABS 0.0 08/23/2023 1431   EOSABS 0.2 11/26/2020 1214   BASOSABS 0.0 08/23/2023 1431   BASOSABS 0.1 11/26/2020 1214   Comprehensive Metabolic Panel:    Component Value Date/Time   NA 132 (L) 08/24/2023 0147   NA 139 11/26/2020 1214   K 4.1 08/24/2023 0147   CL 93 (L) 08/24/2023 0147   CO2 22 08/24/2023 0147   BUN 32 (H) 08/24/2023 0147   BUN 8 11/26/2020 1214   CREATININE 1.02 (H) 08/24/2023 0147   CREATININE 0.47 06/30/2023 1004   GLUCOSE 80 08/24/2023 0147   CALCIUM 7.8 (L) 08/24/2023 0147   AST 69 (H) 08/24/2023 0147   AST 26 06/30/2023 1004   ALT 26 08/24/2023 0147   ALT 11 06/30/2023 1004   ALKPHOS 126 08/24/2023 0147   BILITOT 1.4 (H) 08/24/2023 0147   BILITOT 0.5 06/30/2023 1004   PROT 5.6 (L) 08/24/2023 0147   PROT 6.9 11/26/2020 1214   ALBUMIN 2.3 (L) 08/24/2023 0147   ALBUMIN 4.0 11/26/2020 1214    RADIOGRAPHIC STUDIES: CT Angio Chest PE W and/or Wo Contrast  Result Date: 08/23/2023 CLINICAL DATA:  Bedsore to backside Swelling to the legs EXAM: CT ANGIOGRAPHY CHEST CT ABDOMEN AND PELVIS WITH CONTRAST TECHNIQUE: Multidetector CT imaging of the chest was performed using the standard protocol during bolus administration of intravenous contrast. Multiplanar CT image reconstructions and MIPs were obtained to evaluate the vascular anatomy. Multidetector CT imaging of the abdomen  and pelvis was performed using the standard protocol during bolus administration of intravenous contrast. RADIATION DOSE REDUCTION: This exam was performed according to the departmental dose-optimization program which includes automated exposure control, adjustment of the mA and/or kV according to patient size and/or use of iterative reconstruction technique. CONTRAST:  OMNIPAQUE IOHEXOL 350 MG/ML SOLN COMPARISON:  Chest x-ray 08/23/2023, CT 05/23/2023 FINDINGS: CTA CHEST FINDINGS Cardiovascular: Satisfactory opacification of the pulmonary arteries to the segmental level. Small nonocclusive right middle and lower lobe segmental and sub segmental emboli. Moderate aortic atherosclerosis. No aneurysm or dissection. Normal cardiac size. No pericardial effusion. Mediastinum/Nodes: Midline trachea. No thyroid mass. No suspicious lymph nodes. Esophagus within normal limits. Lungs/Pleura: Emphysema. No acute airspace disease or pleural effusion. Progression of pulmonary metastatic disease. Dominant medial left lung base pulmonary mass measures 5.5 x 3 cm compared with 2.7 x 1.8 cm previously. Peripheral right lower lobe index pulmonary nodule measures 4 by 2.7 cm on series 7, image 93, previously 2.3 x 1.7 cm. Enlargement of numerous additional pulmonary nodules. Right middle lobe collapse. Status post right upper lobectomy. Musculoskeletal: Right scapular metastatic lesion with involvement of adjacent muscle is again noted. Review of the MIP images confirms the above findings. CT ABDOMEN and PELVIS FINDINGS Hepatobiliary: Liver is enlarged. Multiple large hepatic metastatic lesions which appear increased. Index left hepatic lobe mass measures 10.2 by 7.8 cm,  compared with 7.3 x 6 cm. Large mass or masses replacing the left hepatic lobe, also increased compared to prior, this measures 21.4 by 14.1 cm previously 18 x 12.2 cm. No calcified gallstone. No biliary dilatation. Pancreas: No inflammatory changes. Posteriorly  displaced by mass effect from enlarged liver mass and masses. Spleen: Normal in size without focal abnormality. Adrenals/Urinary Tract: Right adrenal gland within normal limits. 3.1 x 2.7 cm left adrenal mass slightly enlarged and more heterogeneous in appearance, contiguous with suspicious solid mass measuring 2.5 x 1.7 cm on series 2, image 23 and concerning for metastatic disease. Kidneys show no hydronephrosis. Interim development or enlargement of multiple right renal masses, for example 2 cm solid upper pole lesion on series 2, image 20, raising concern for metastatic focus. 1.9 cm right retroperitoneal Peri renal mass on series 2, image 17 increased and concerning for metastatic lesion. Ill-defined bilateral foci of hypoenhancement within both kidneys on delayed views. Urinary bladder is unremarkable Stomach/Bowel: Stomach is nonenlarged. No dilated small bowel. No acute bowel wall thickening. Vascular/Lymphatic: Advanced aortic atherosclerosis with irregular mural thrombus and areas of ulcerated plaque throughout the aorta. No suspicious abdominal lymph nodes. Reproductive: Calcified and noncalcified uterine fibroids. No definitive adnexal mass. Other: No free air or significant ascites. Extensive subcutaneous edema consistent with anasarca. Musculoskeletal: No acute osseous abnormality. Increased lobulated mass in the left gluteal subcutaneous soft tissues measuring 1.7 cm on series 2, image 75 consistent with metastatic lesion. Incompletely visualized low-density within the left abductor muscles on series 2 image 88. Review of the MIP images confirms the above findings. IMPRESSION: CHEST CT: 1. Small nonocclusive right segmental and subsegmental pulmonary emboli in this patient is status post right upper lobectomy. 2. Progression of pulmonary metastatic disease. 3. Right scapular metastatic lesion with involvement of adjacent muscle is again noted. ABDOMEN AND PELVIS CT: 1. Progression of hepatic  metastatic disease. 2. Interim development or enlargement of multiple right renal and perirenal/retroperitoneal masses consistent with metastatic disease. 3. Increased lobulated mass in the left gluteal subcutaneous soft tissues consistent with metastatic lesion. Increased size and heterogeneity of previously noted left adrenal adenoma, now contiguous with solid enhancing mass and concerning for metastatic disease. 4. Ill-defined bilateral foci of hypoenhancement within both kidneys on delayed views, question pyelonephritis versus metastatic disease. 5. Extensive subcutaneous edema consistent with anasarca. Incompletely visualized low-density appearance of left abductor muscles which could be due to nonspecific muscle edema or inflammatory process. 6. Aortic atherosclerosis. Aortic Atherosclerosis (ICD10-I70.0) and Emphysema (ICD10-J43.9). Critical Value/emergent results were called by telephone at the time of interpretation on 08/23/2023 at 8:14 pm to provider NEHA RAY , who verbally acknowledged these results. Electronically Signed   By: Jasmine Pang M.D.   On: 08/23/2023 20:14   CT ABDOMEN PELVIS W CONTRAST  Result Date: 08/23/2023 CLINICAL DATA:  Bedsore to backside Swelling to the legs EXAM: CT ANGIOGRAPHY CHEST CT ABDOMEN AND PELVIS WITH CONTRAST TECHNIQUE: Multidetector CT imaging of the chest was performed using the standard protocol during bolus administration of intravenous contrast. Multiplanar CT image reconstructions and MIPs were obtained to evaluate the vascular anatomy. Multidetector CT imaging of the abdomen and pelvis was performed using the standard protocol during bolus administration of intravenous contrast. RADIATION DOSE REDUCTION: This exam was performed according to the departmental dose-optimization program which includes automated exposure control, adjustment of the mA and/or kV according to patient size and/or use of iterative reconstruction technique. CONTRAST:  OMNIPAQUE  IOHEXOL 350 MG/ML SOLN COMPARISON:  Chest x-ray 08/23/2023, CT  05/23/2023 FINDINGS: CTA CHEST FINDINGS Cardiovascular: Satisfactory opacification of the pulmonary arteries to the segmental level. Small nonocclusive right middle and lower lobe segmental and sub segmental emboli. Moderate aortic atherosclerosis. No aneurysm or dissection. Normal cardiac size. No pericardial effusion. Mediastinum/Nodes: Midline trachea. No thyroid mass. No suspicious lymph nodes. Esophagus within normal limits. Lungs/Pleura: Emphysema. No acute airspace disease or pleural effusion. Progression of pulmonary metastatic disease. Dominant medial left lung base pulmonary mass measures 5.5 x 3 cm compared with 2.7 x 1.8 cm previously. Peripheral right lower lobe index pulmonary nodule measures 4 by 2.7 cm on series 7, image 93, previously 2.3 x 1.7 cm. Enlargement of numerous additional pulmonary nodules. Right middle lobe collapse. Status post right upper lobectomy. Musculoskeletal: Right scapular metastatic lesion with involvement of adjacent muscle is again noted. Review of the MIP images confirms the above findings. CT ABDOMEN and PELVIS FINDINGS Hepatobiliary: Liver is enlarged. Multiple large hepatic metastatic lesions which appear increased. Index left hepatic lobe mass measures 10.2 by 7.8 cm, compared with 7.3 x 6 cm. Large mass or masses replacing the left hepatic lobe, also increased compared to prior, this measures 21.4 by 14.1 cm previously 18 x 12.2 cm. No calcified gallstone. No biliary dilatation. Pancreas: No inflammatory changes. Posteriorly displaced by mass effect from enlarged liver mass and masses. Spleen: Normal in size without focal abnormality. Adrenals/Urinary Tract: Right adrenal gland within normal limits. 3.1 x 2.7 cm left adrenal mass slightly enlarged and more heterogeneous in appearance, contiguous with suspicious solid mass measuring 2.5 x 1.7 cm on series 2, image 23 and concerning for metastatic disease.  Kidneys show no hydronephrosis. Interim development or enlargement of multiple right renal masses, for example 2 cm solid upper pole lesion on series 2, image 20, raising concern for metastatic focus. 1.9 cm right retroperitoneal Peri renal mass on series 2, image 17 increased and concerning for metastatic lesion. Ill-defined bilateral foci of hypoenhancement within both kidneys on delayed views. Urinary bladder is unremarkable Stomach/Bowel: Stomach is nonenlarged. No dilated small bowel. No acute bowel wall thickening. Vascular/Lymphatic: Advanced aortic atherosclerosis with irregular mural thrombus and areas of ulcerated plaque throughout the aorta. No suspicious abdominal lymph nodes. Reproductive: Calcified and noncalcified uterine fibroids. No definitive adnexal mass. Other: No free air or significant ascites. Extensive subcutaneous edema consistent with anasarca. Musculoskeletal: No acute osseous abnormality. Increased lobulated mass in the left gluteal subcutaneous soft tissues measuring 1.7 cm on series 2, image 75 consistent with metastatic lesion. Incompletely visualized low-density within the left abductor muscles on series 2 image 88. Review of the MIP images confirms the above findings. IMPRESSION: CHEST CT: 1. Small nonocclusive right segmental and subsegmental pulmonary emboli in this patient is status post right upper lobectomy. 2. Progression of pulmonary metastatic disease. 3. Right scapular metastatic lesion with involvement of adjacent muscle is again noted. ABDOMEN AND PELVIS CT: 1. Progression of hepatic metastatic disease. 2. Interim development or enlargement of multiple right renal and perirenal/retroperitoneal masses consistent with metastatic disease. 3. Increased lobulated mass in the left gluteal subcutaneous soft tissues consistent with metastatic lesion. Increased size and heterogeneity of previously noted left adrenal adenoma, now contiguous with solid enhancing mass and concerning  for metastatic disease. 4. Ill-defined bilateral foci of hypoenhancement within both kidneys on delayed views, question pyelonephritis versus metastatic disease. 5. Extensive subcutaneous edema consistent with anasarca. Incompletely visualized low-density appearance of left abductor muscles which could be due to nonspecific muscle edema or inflammatory process. 6. Aortic atherosclerosis. Aortic Atherosclerosis (ICD10-I70.0) and Emphysema (  ICD10-J43.9). Critical Value/emergent results were called by telephone at the time of interpretation on 08/23/2023 at 8:14 pm to provider NEHA RAY , who verbally acknowledged these results. Electronically Signed   By: Jasmine Pang M.D.   On: 08/23/2023 20:14   DG Chest Port 1 View  Result Date: 08/23/2023 CLINICAL DATA:  Weakness leg swelling EXAM: PORTABLE CHEST 1 VIEW COMPARISON:  08/12/2021, CT 05/23/2023 FINDINGS: Right-sided central venous port tip at the cavoatrial region. No acute airspace disease or pleural effusion. Normal cardiac size. Numerous bilateral pulmonary masses consistent with metastatic disease, suspect increased compared with CT from August. Emphysema. IMPRESSION: Emphysema. Numerous bilateral pulmonary masses consistent with metastatic disease, appears increased compared with CT from August. Electronically Signed   By: Jasmine Pang M.D.   On: 08/23/2023 18:38   CT Head Wo Contrast  Result Date: 08/23/2023 CLINICAL DATA:  Mental status change of unknown cause. EXAM: CT HEAD WITHOUT CONTRAST TECHNIQUE: Contiguous axial images were obtained from the base of the skull through the vertex without intravenous contrast. RADIATION DOSE REDUCTION: This exam was performed according to the departmental dose-optimization program which includes automated exposure control, adjustment of the mA and/or kV according to patient size and/or use of iterative reconstruction technique. COMPARISON:  MRI 05/11/2023.  CT 03/08/2022. FINDINGS: Brain: No abnormality seen  affecting the brainstem. 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 Cerebral hemispheres appear intrinsically unremarkable without any evidence of prior stroke, intra-axial mass lesion, hemorrhage or hydrocephalus. There has been marked enlargement of a metastatic lesion affecting the calvarium at the lateral margin of the middle cranial fossa on the right. Previously, this lesion measured up to 2 cm in thickness, with both external and internal soft tissue component. Now, the thickness is up to 4.3 cm. This bulges much more into the lateral aspect of the middle cranial fossa and has some mass-effect upon the right temporal lobe. Second bone lesion previously seen in the occiput put has grown extensively. In July, this only measured a cm in size. Presently, the infiltrating bone component measures up to 4.4 cm, with tumor bulging exterior to the calvarium an inward towards the brain. There could be involvement of the superior sagittal sinus. Maximal thickness of tumor is 2.5 cm. No third calvarial lesion is seen. Vascular: There is atherosclerotic calcification of the major vessels at the base of the brain. Skull: See above. Sinuses/Orbits: Clear/normal Other: None IMPRESSION: 1. Marked enlargement of a metastatic lesion affecting the calvarium at the lateral margin of the middle cranial fossa on the right. Previously, this lesion measured up to 2 cm in thickness, with both external and internal soft tissue component. Now, the thickness is up to 4.3 cm. This bulges much more into the lateral aspect of the middle cranial fossa and has some mass-effect upon the right temporal lobe. 2. Second bone lesion previously seen in the occiput put has grown extensively. In July, this only measured a cm in size. Presently, the infiltrating bone component measures up to 4.4 cm, with tumor bulging external to the calvarium and inward towards the brain. There could be involvement of the superior sagittal sinus.  Maximal thickness of tumor is 2.5 cm. 3. No third calvarial lesion is seen. 4. 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 Electronically Signed   By: Paulina Fusi M.D.   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  Patient currently in ICU on pressors.  She has intermittent confusion.  Joint family meeting attended by [facility] team and hospice liaison.  Family recognize that patient is at end-of-life secondary to her terminal lung cancer. Family clearly tell us their goal is patient's comfort and appropriately decided on DNR/DNI. Initially, they expressed a desire to take patient home with hospice but later opted to initiate comfort measures in the hospital.    Symptomatically, patient endorses pain.  CCM have liberalized pain medications today. Anticipate rapid decline and probable in hospital death.   PLAN: -Comfort care -DNR/DNI  Case and plan discussed with Dr. Donneta Romberg  Time Total: 45 minutes  Visit consisted of counseling and education dealing with the complex and emotionally intense issues of symptom management and palliative care in the setting of serious and potentially life-threatening illness.Greater than 50%  of this time was spent counseling and coordinating care related to the above assessment and plan.  Signed by: Laurette Schimke, PhD, NP-C

## 2023-08-24 NOTE — Progress Notes (Signed)
Patient was transferred to 122 with family at bedside. No distress noted with patient. Handoff dilaudid preformed. Will transfer care at this time. Treatment team also aware.

## 2023-08-24 NOTE — Plan of Care (Signed)
  Problem: Activity: Goal: Risk for activity intolerance will decrease Outcome: Progressing   Problem: Coping: Goal: Level of anxiety will decrease Outcome: Progressing   Problem: Pain Management: Goal: General experience of comfort will improve Outcome: Progressing

## 2023-08-25 DIAGNOSIS — C799 Secondary malignant neoplasm of unspecified site: Secondary | ICD-10-CM | POA: Diagnosis not present

## 2023-08-25 DIAGNOSIS — C3490 Malignant neoplasm of unspecified part of unspecified bronchus or lung: Secondary | ICD-10-CM | POA: Diagnosis not present

## 2023-08-25 DIAGNOSIS — C7951 Secondary malignant neoplasm of bone: Secondary | ICD-10-CM | POA: Diagnosis not present

## 2023-08-25 DIAGNOSIS — J9621 Acute and chronic respiratory failure with hypoxia: Secondary | ICD-10-CM | POA: Diagnosis not present

## 2023-08-25 LAB — URINE CULTURE

## 2023-08-25 MED ORDER — ATROPINE SULFATE 1 % OP SOLN: 2.0000 [drp] | Freq: Three times a day (TID) | OPHTHALMIC | Status: DC | PRN

## 2023-08-25 NOTE — TOC Initial Note (Signed)
Transition of Care [facility]) - Progression Note    Patient Details  Name: Stacie Kennedy MRN: [id number] Date of Birth: August 03, 1961  [facility] CM/SW Contact  Garret Reddish, RN Phone Number: 08/25/2023, 11:26 AM  Clinical Narrative:    Chart reviewed.  Noted that patient was admitted with NSCLC metastatic to bone.    I have meet with patient, her son, and niece at the bedside.  Patient was able to open her eyes but continue to fall back asleep.  I have spoken with her son and her niece.    Patient's niece reports that prior to admission patient lived at home wit her boyfriend.  She informs me that patient was able to walk with a walker and get around her home until she got sick.  She informs me that patient's sister takes her to the doctor.  Patient has a home 02, shower chair, rolling walker, and BSC at home.  Patient's niece reports that patient is active with a Hospice agency but she is not aware of the agency name.    I have spoken with Annice Pih with [facility].  He informs me that patient is an active home Hospice patient.  He informs me that patient is not interested in the Hospice Home at this time.  Family anticipates hospital death.    TOC will continue for follow for discharge planning.     Expected Discharge Plan:  (TBD possible home with Hospice v/s Hospice home) Barriers to Discharge: No Barriers Identified  Expected Discharge Plan and Services   Discharge Planning Services: CM Consult                       DME Agency:  (Patient has home 02, Shower Chair, [facility] and walker)                   Social Determinants of Health (SDOH) Interventions SDOH Screenings   Food Insecurity: Food Insecurity Present (07/09/2021)  Housing: Medium Risk (04/22/2022)  Transportation Needs: No Transportation Needs (07/09/2021)  Alcohol Screen: Low Risk  (03/19/2021)  Depression (PHQ2-9): Medium Risk (04/22/2022)  Financial Resource Strain: High Risk (04/22/2022)   Physical Activity: Insufficiently Active (04/16/2021)  Social Connections: Socially Isolated (04/22/2022)  Stress: Stress Concern Present (04/22/2022)  Tobacco Use: High Risk (07/26/2023)    Readmission Risk Interventions     No data to display

## 2023-08-25 NOTE — Progress Notes (Signed)
NAME:  Stacie Kennedy, MRN:  [id number], DOB:  [birth year], LOS: 2 ADMISSION DATE:  08/23/2023, CONSULTATION DATE:  08/23/23 REFERRING MD:  Dr. Rosalia Hammers, CHIEF COMPLAINT:  Leg swelling   History of Present Illness:  62 yo F presenting to [facility] ED via EMS from home on 08/23/23 for evaluation of leg swelling and back pain from a sacral wound.  History obtained per chart review, patient bedside report and conversation with sister and niece. 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  Per oncology notation patient's initial diagnosis of primary cancer of the RUL in January 2022 s/p lobectomy.  She is post multiple rounds of chemotherapy.  In November 2022 PET scan revealed right scapular lesion status post radiation therapy. Liver biopsy in September 2023 revealed metastatic cancer with neuroendocrine features.  This was followed by four other runs of chemotherapy ending in August 2024.  Two new brain lesions were found in 04/2023. She was referred to palliative care and more recently hospice.  Patient lives at home with a "friend"/significant other, her sister niece and son are all involved in her care.  Patient interactive with persistence during bedside interview, but only with brief answers and not interactive during goals of care conversation.  Sister and niece describe that she appears to have been declining over the last month but more significantly over the last week.  The patient has had exertional dyspnea with increasing weakness and has been less able to get up and move around regularly over the last 3 days.  They have also noticed increasing BLE edema.  They confirm complaints of abdominal/back and buttocks pain. They report the patient has not been eating the past two days, even though the patient reports eating "some cereal yesterday". They deny recent falls, chest pain, fever/ chills, vomiting/ diarrhea. She has a 10.8 pack year history and is still  actively smoking. Family denies any recent ETOH use, and denies recreational drug use. Most recent documentation from oncology has the patient prescribed OxyContin 60 mg BID and Oxycodone IR 15 mg 4 times daily. Her sister states she is no longer taking the 60 mg BID only 15 mg oxycodone 4 times a day. She does not wear oxygen at home.  ED course: Upon arrival, patient with flat affect, but responsive with soft BP and hypoxia requiring 5 L Eclectic support. Labs significant for hypoglycemia, AKI, slightly improved Alk phos, but elevated T. Bili & ALT, BNP & INR also elevated with mild anemia close to baseline. Imaging concerning for enlarging brain masses, 1 mass has doubled in size since MRI in July 2024 and second mass has quadrupled in size with mass effect and right temporal lobe.  CT angio revealed small segmental and subsegmental pulmonary emboli, with progression of metastatic disease noted throughout CT abdomen and pelvis. BP has remained soft with SBP in the 80's while MAP's >/= 65. Medications given: fentanyl, D10W bolus, 1 L NS bolus, IV contrast Initial Vitals: 99, 19, 84, 90/60 & 90% on 2 L [place] Significant labs: (Labs/ Imaging personally reviewed) I, Cheryll Cockayne Rust-Chester, AGACNP-BC, personally viewed and interpreted this ECG. EKG Interpretation: Date: 08/23/2023, EKG Time: 14:30, Rate: 89, Rhythm: NSR, QRS Axis: Normal, Intervals: LAE & probable LVH, ST/T Wave abnormalities: None, Narrative Interpretation: NSR with probable LVH and LAE Chemistry: Na+:129, K+: 4.6, BUN/Cr.: 34/1.32, Serum CO2/ AG: 24/15, Cl: 90, Alk phos: 162, albumin: 2.6, AST/ALT: 96/31, T.Bili: 1.6 Hematology: WBC: 8.6, Hgb: 9.5,  Troponin: Pending, BNP:  281.3, Lactic/ PCT: Pending, COVID-19: pending  CXR 08/23/23: emphysema with numerous bilateral pulmonary masses consistent with metastatic disease, appears increased compared with CT from august CT head wo contrast 08/23/23: Marked enlargement of a metastatic lesion  affecting the calvarium at the lateral margin of the middle cranial fossa on the right. Previously, this lesion measured up to 2 cm in thickness, with both external and internal soft tissue component. Now, the thickness is up to 4.3 cm. This bulges much more into the lateral aspect of the middle cranial fossa and has some mass-effect upon the right temporal lobe. Second bone lesion previously seen in the occiput put has grown extensively. In July, this only measured a cm in size. Presently, the infiltrating bone component measures up to 4.4 cm, with tumor bulging external to the calvarium and inward towards the brain. There could be involvement of the superior sagittal sinus. Maximal thickness of tumor is 2.5 cm. No third calvarial lesion is seen. 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 CT angio PE 08/23/23: Small nonocclusive right segmental and subsegmental pulmonary emboli in this patient is status post right upper lobectomy. Progression of pulmonary metastatic disease. Right scapular metastatic lesion with involvement of adjacent muscle is again noted. CT abdomen/pelvis w contrast 08/23/23: Progression of hepatic metastatic disease. Interim development or enlargement of multiple right renal and perirenal/retroperitoneal masses consistent with metastatic disease. Increased lobulated mass in the left gluteal subcutaneous soft tissues consistent with metastatic lesion. Increased size and heterogeneity of previously noted left adrenal adenoma, now contiguous with solid enhancing mass and concerning for metastatic disease. Ill-defined bilateral foci of hypoenhancement within both kidneys on delayed views, question pyelonephritis versus metastatic disease. Extensive subcutaneous edema consistent with anasarca. Incompletely visualized low-density appearance of left abductor muscles which could be due to nonspecific muscle edema or inflammatory process. Aortic atherosclerosis.  PCCM consulted  for admission due to hypotension and small pulmonary emboli with probable need for vasopressor support in the setting of extensive metastatic disease with high risk for decline.  Pertinent  Medical History  Stage IV NSCLC with mets Depression HLD Pre-diabetes Current everyday smoker  Micro Data:   11/12: MRSA PCR>>negative  11/13: COVID>>negative  11/13: Urine>>  Anti-infectives (From admission, onward)    Start     Dose/Rate Route Frequency Ordered Stop   08/24/23 0930  cefTRIAXone (ROCEPHIN) 2 g in sodium chloride 0.9 % 100 mL IVPB  Status:  Discontinued        2 g 200 mL/hr over 30 Minutes Intravenous Every 24 hours 08/24/23 0844 08/24/23 1135      Significant Hospital Events: Including procedures, antibiotic start and stop dates in addition to other pertinent events   08/23/23: Admit to ICU with metastatic NSCLC and hypotension in the setting of small PE with high risk of decline 08/24/23: Pt currently requiring levophed gtt @10  mcg/min to maintain map 65 or higher.   Pt transitioned to Comfort Measures Only per pts family request following goals of care treatment  08/25/23: Dilaudid gtt infusing @1  mg/hr pts sister reports the pt will intermittently yell out stating she is having pain but will fall back to sleep.  RN stated overnight pt had intermittent coughing requesting antitussive.  Increased dilaudid gtt to 1.5 mg/hr and orders placed for sublingual atropine   Interim History / Subjective:  Pt resting comfortably in no acute distress.   Objective   Blood pressure (!) 89/52, pulse 90, temperature (!) 97.5 F (36.4 C), resp. rate 16, height 5' 7.5" (  1.715 m), weight 62.9 kg, SpO2 90%.        Intake/Output Summary (Last 24 hours) at 08/25/2023 0854 Last data filed at 08/25/2023 0418 Gross per 24 hour  Intake 334.51 ml  Output 650 ml  Net -315.49 ml   Filed Weights   08/23/23 2028 08/23/23 2224 08/24/23 0307  Weight: 47 kg 62.9 kg 62.9 kg     Examination: General: Acute on chronically-ill cachetic appearing female HEENT: Anicteric, atraumatic, neck supple Neuro: Sedated on dilaudid gtt for comfort   Skin: Sacral pressure injury noted on admission  Extremities: [facility] Problem list     Assessment & Plan:  #Acute hypoxic respiratory failure suspect multifocal secondary to small RIGHT segmental & subsegmental pulmonary emboli in the setting of Metastatic NSCLC #Current everyday smoker #Small RIGHT segmental and subsegmental pulmonary emboli #Hypotension suspect multifocal secondary to pain medication & hypovolemia/dehydration low suspicion for sepsis  #Elevated BNP #Acute Kidney Injury suspect prerenal secondary to hypovolemia and dehydration #Hyponatremia suspect secondary to volume depletion  #Possible UTI  #Stage IV NSCLC with metastases #Chronic Pain #Sacral Pressure Injury: present on admission  #Severe protein calorie malnutrition  #Pre-Diabetes #Hypoglycemia - Pt transitioned to Comfort Measures Only  - Continue dilaudid gtt and/or bolus for comfort  - Prn glycopyrrolate and/or atropine for excessive secretions  - Prn ativan for for anxiety and/or seizure activity  - Prn bronchodilator therapy for dyspnea  - Prn bowel regimen for constipation  - Unrestricted visitation   Best Practice (right click and "Reselect all SmartList Selections" daily)  Diet/type: DYS 1 DVT prophylaxis: N/A GI prophylaxis: N/A Lines: yes and it is still needed PORT in place present on admission  Foley:  N/A Code Status: DNR~Comfort Care Only  Last date of multidisciplinary goals of care discussion [N/A]  Labs   CBC: Recent Labs  Lab 08/23/23 1431 08/24/23 0147  WBC 8.6 8.0  NEUTROABS 7.4  --   HGB 9.5* 8.1*  HCT 27.9* 24.8*  MCV 87.2 87.9  PLT 242 210    Basic Metabolic Panel: Recent Labs  Lab 08/23/23 1431 08/23/23 2311 08/24/23 0147 08/24/23 0518  NA 129*  --  132*  --   K 4.6  --   4.1  --   CL 90*  --  93*  --   CO2 24  --  22  --   GLUCOSE 62*  --  80  --   BUN 34*  --  32*  --   CREATININE 1.32*  --  1.02*  --   CALCIUM 7.8*  --  7.8*  --   MG  --  1.4* 1.3* 1.9  PHOS  --  3.5 3.2  --    GFR: Estimated Creatinine Clearance: 56.7 mL/min (A) (by C-G formula based on SCr of 1.02 mg/dL (H)). Recent Labs  Lab 08/23/23 1431 08/23/23 2311 08/24/23 0147 08/24/23 0518  PROCALCITON  --  0.65 0.61 0.55  WBC 8.6  --  8.0  --   LATICACIDVEN  --  2.0* 2.0* 1.8    Liver Function Tests: Recent Labs  Lab 08/23/23 1431 08/24/23 0147  AST 96* 69*  ALT 31 26  ALKPHOS 162* 126  BILITOT 1.6* 1.4*  PROT 6.4* 5.6*  ALBUMIN 2.6* 2.3*   No results for input(s): "LIPASE", "AMYLASE" in the last 168 hours. No results for input(s): "AMMONIA" in the last 168 hours.  ABG    Component Value Date/Time   PHART 7.413 09/17/2020 1201   PCO2ART 33.9  09/17/2020 1201   PO2ART 75.5 (L) 09/17/2020 1201   HCO3 21.2 09/17/2020 1201   ACIDBASEDEF 2.7 (H) 09/17/2020 1201   O2SAT 96.0 09/17/2020 1201     Coagulation Profile: Recent Labs  Lab 08/23/23 2042  INR 1.6*    Cardiac Enzymes: No results for input(s): "CKTOTAL", "CKMB", "CKMBINDEX", "TROPONINI" in the last 168 hours.  HbA1C: Hgb A1c MFr Bld  Date/Time Value Ref Range Status  08/24/2023 01:47 AM 5.2 4.8 - 5.6 % Final    Comment:    (NOTE) Pre diabetes:          5.7%-6.4%  Diabetes:              >6.4%  Glycemic control for   <7.0% adults with diabetes   11/26/2020 12:14 PM 6.2 (H) 4.8 - 5.6 % Final    Comment:             Prediabetes: 5.7 - 6.4          Diabetes: >6.4          Glycemic control for adults with diabetes: <7.0     CBG: Recent Labs  Lab 08/23/23 2224 08/23/23 2357 08/24/23 0359 08/24/23 0732 08/24/23 1134  GLUCAP 95 69* 90 88 95    Review of Systems  Unable to assess pt sedated for CMO   Past Medical History:  She,  has a past medical history of Cancer (HCC), Cancer  associated pain, Depression, Duodenitis, Dyspnea, Family history of cancer, High cholesterol, Personal history of colonic polyps, Pre-diabetes, Umbilical hernia, and UTI (urinary tract infection).   Surgical History:   Past Surgical History:  Procedure Laterality Date   COLONOSCOPY WITH PROPOFOL N/A 03/24/2021   Procedure: COLONOSCOPY WITH PROPOFOL;  Surgeon: Wyline Mood, MD;  Location: [facility] ENDOSCOPY;  Service: Gastroenterology;  Laterality: N/A;   INTERCOSTAL NERVE BLOCK  09/19/2020   Procedure: INTERCOSTAL NERVE BLOCK;  Surgeon: Loreli Slot, MD;  Location: [facility] OR;  Service: Thoracic;;   IR IMAGING GUIDED PORT INSERTION  09/23/2021   LUNG LOBECTOMY Right    LUNG REMOVAL, PARTIAL Right 09/19/2020   NODE DISSECTION Right 09/19/2020   Procedure: NODE DISSECTION;  Surgeon: Loreli Slot, MD;  Location: [facility] OR;  Service: Thoracic;  Laterality: Right;   VIDEO BRONCHOSCOPY N/A 07/08/2021   Procedure: VIDEO BRONCHOSCOPY;  Surgeon: Loreli Slot, MD;  Location: [facility] OR;  Service: Thoracic;  Laterality: N/A;     Social History:   reports that she has been smoking cigarettes. She has a 10.8 pack-year smoking history. She has never used smokeless tobacco. She reports current alcohol use of about 2.0 standard drinks of alcohol per week. She reports that she does not use drugs.   Family History:  Her family history includes Cancer in her brother and mother; Diabetes in her brother, father, and mother; Healthy in her sister; Heart attack in her father; Heart disease in her brother; Hepatitis in her brother; Hypertension in her brother; Stroke in her father.   Allergies No Known Allergies   Home Medications  Prior to Admission medications   Medication Sig Start Date End Date Taking? Authorizing Provider  albuterol (PROVENTIL HFA) 108 (90 Base) MCG/ACT inhaler Inhale 2 puffs into the lungs once every 6 (six) hours as needed for wheezing or shortness of breath. 07/27/23    Earna Coder, MD  DULoxetine (CYMBALTA) 30 MG capsule Take 1 capsule (30 mg total) by mouth daily. For nausea, appetite, sleep 04/18/23   Alinda Dooms, NP  lidocaine-prilocaine (EMLA) cream Apply one application topically the the affected area(s) daily as needed. 05/16/23   Earna Coder, MD  loratadine (CLARITIN) 10 MG tablet Take 1 tablet (10 mg total) by mouth daily. 10/16/21   Earna Coder, MD  metFORMIN (GLUCOPHAGE) 500 MG tablet Take 1 tablet (500 mg total) by mouth daily with breakfast. 03/04/23   Earna Coder, MD  naloxone [facility]) nasal spray 4 mg/0.1 mL SPRAY 1 SPRAY INTO ONE NOSTRIL AS DIRECTED FOR OPIOID OVERDOSE (TURN PERSON ON SIDE AFTER DOSE. IF NO RESPONSE IN 2-3 MINUTES OR PERSON RESPONDS BUT RELAPSES, REPEAT USING A NEW SPRAY DEVICE AND SPRAY INTO THE OTHER NOSTRIL. CALL 911 AFTER USE.) * EMERGENCY USE ONLY * Patient not taking: Reported on 07/26/2023 07/29/22   Borders, Daryl Eastern, NP  OLANZapine (ZYPREXA) 5 MG tablet Take 1 tablet (5 mg total) by mouth at bedtime. 05/16/23   Earna Coder, MD  ondansetron (ZOFRAN) 4 MG tablet TAKE 2 TABLETS (8MG ) BY MOUTH ONCE EVERY 8 HOURS AS NEEDED FOR NAUSEA OR VOMITING. 05/16/23   Earna Coder, MD  oxyCODONE (OXYCONTIN) 60 MG 12 hr tablet Take 1 tablet by mouth every 12 (twelve) hours. 08/05/23   Borders, Daryl Eastern, NP  oxyCODONE (ROXICODONE) 15 MG immediate release tablet Take 1-2 tablets (15-30 mg total) by mouth every 4 (four) hours as needed for pain. 08/05/23   Borders, Daryl Eastern, NP  prochlorperazine (COMPAZINE) 10 MG tablet Take 1 tablet (10 mg total) by mouth once every 6 (six) hours as needed for nausea or vomiting. 08/31/21   Earna Coder, MD  psyllium (METAMUCIL) 58.6 % packet Take 1 packet by mouth daily.    [provider]     Critical care time: 29 minutes    Zada Girt, AGNP  Pulmonary/Critical Care Pager [phone] (please enter 7 digits) PCCM Consult Pager  [phone] (please enter 7 digits)

## 2023-08-25 NOTE — Plan of Care (Signed)
Pt calling out that her abdomen is tight and it is rock hard.  She is on a dilaudid drip for comfort care.  Reached out to critical care NP and asked for suggestions and let her know that I had given her a  1 mg bolus.  She advised to increase dilaudid drip which I did going up to 2 mg.  She explained that hardness is d/t cancerous mass.

## 2023-08-25 NOTE — Progress Notes (Signed)
[facility] Patient Visit   [name] is a current hospice patient, with hospice diagnosis of malignant neoplasm of upper lobe, right bronchus or lung. She was admitted 08/23/23 with diagnosis of respiratory failure with pulmonary embolus and metastatic lung cancer. [facility] was notified by Misty Stanley, her sister that patient was enroute to ED. Per Dr. Patric Dykes, hospice physician, this is a related hospital admission.    Visited with patient and family at the bedside.  Patient was sitting up in bed with 02 in place.  She was moaning and appeared to be uncomfortable.  Patient is receiving continuous pain medication.  Multiple family members present, attending to patient's needs.  Spoke with son and offered support.  Son given HL phone number to call if needs or concerns arose.     Patient is GIP appropriate due to need for IV pain medication.   Vital Signs: 97.5  89/52  90  16  02 at 0.5L/min 90%   I&O: not recorded/200   Abnormal labs: Na 129, BUN 34, Cre 1.32, Ca 7.8, Alb 2.6, t.bili 1.6, GFR 46, BNP 281, HGB 9.5, PT 18.8  No new lab results 11.14.24   Diagnostics: CT Head: IMPRESSION: 1. Marked enlargement of a metastatic lesion affecting the calvarium at the lateral margin of the middle cranial fossa on the right. Previously, this lesion measured up to 2 cm in thickness, with both external and internal soft tissue component. Now, the thickness is up to 4.3 cm. This bulges much more into the lateral aspect of the middle cranial fossa and has some mass-effect upon the right temporal lobe. 2. Second bone lesion previously seen in the occiput put has grown extensively. In July, this only measured a cm in size. Presently, the infiltrating bone component measures up to 4.4 cm, with tumor bulging external to the calvarium and inward towards the brain. There could be involvement of the superior sagittal sinus.  Maximal thickness of tumor is 2.5 cm. 3. No third calvarial lesion is seen. 4. 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   CT chest/abd/pelvis: IMPRESSION: CHEST CT:   1. Small nonocclusive right segmental and subsegmental pulmonary emboli in this patient is status post right upper lobectomy. 2. Progression of pulmonary metastatic disease. 3. Right scapular metastatic lesion with involvement of adjacent muscle is again noted.   ABDOMEN AND PELVIS CT:   1. Progression of hepatic metastatic disease. 2. Interim development or enlargement of multiple right renal and perirenal/retroperitoneal masses consistent with metastatic disease. 3. Increased lobulated mass in the left gluteal subcutaneous soft tissues consistent with metastatic lesion. Increased size and heterogeneity of previously noted left adrenal adenoma, now contiguous with solid enhancing mass and concerning for metastatic disease. 4. Ill-defined bilateral foci of hypoenhancement within both kidneys on delayed views, question pyelonephritis versus metastatic disease. 5. Extensive subcutaneous edema consistent with anasarca. Incompletely visualized low-density appearance of left abductor muscles which could be due to nonspecific muscle edema or inflammatory process. 6. Aortic atherosclerosis.  No new diagnostic results 11.14.24   IV/PRN Meds: Dilaudid 50mg  in 50ml NS  Rate 0.68ml/hr Dose 0.4mg /hr cont IV.  Dilaudid 1-2 mg q 2hr prn IV x 1.    Problem List:  #Acute hypoxic respiratory failure suspect multifocal secondary to small RIGHT segmental & subsegmental pulmonary emboli in the setting of Metastatic NSCLC #Current everyday smoker #Small RIGHT segmental and subsegmental pulmonary emboli #Hypotension suspect multifocal secondary to pain medication & hypovolemia/dehydration low suspicion for  sepsis  #Elevated BNP #Acute Kidney Injury suspect prerenal secondary to hypovolemia and  dehydration #Hyponatremia suspect secondary to volume depletion  #Possible UTI  #Stage IV NSCLC with metastases #Chronic Pain #Sacral Pressure Injury: present on admission  #Severe protein calorie malnutrition  #Pre-Diabetes #Hypoglycemia - Pt transitioned to Comfort Measures Only  - Continue dilaudid gtt and/or bolus for comfort  - Prn glycopyrrolate and/or atropine for excessive secretions  - Prn ativan for for anxiety and/or seizure activity  - Prn bronchodilator therapy for dyspnea  - Prn bowel regimen for constipation  - Unrestricted visitation      Discharge Planning: Ongoing   Family Contact: Multiple family members present.  HL spoke with patient's son.     IDT: Updated   Goals of Care: Was Full code, changed to DNR/DNI (see above)  [facility] Liaison [phone]

## 2023-08-25 NOTE — Plan of Care (Signed)

## 2023-08-25 NOTE — Progress Notes (Signed)
I checked on patient, currently under comfort care.  Patient resting comfortably.  Spoke to niece by the bedside.  GB

## 2023-08-26 ENCOUNTER — Encounter: Payer: Self-pay | Admitting: Internal Medicine

## 2023-08-26 DIAGNOSIS — C3490 Malignant neoplasm of unspecified part of unspecified bronchus or lung: Secondary | ICD-10-CM | POA: Diagnosis not present

## 2023-08-26 DIAGNOSIS — Z515 Encounter for palliative care: Secondary | ICD-10-CM | POA: Diagnosis not present

## 2023-08-26 DIAGNOSIS — C7951 Secondary malignant neoplasm of bone: Secondary | ICD-10-CM | POA: Diagnosis not present

## 2023-08-26 NOTE — Care Management Important Message (Signed)
Important Message  Patient Details  Name: Simmone Figuereo MRN: [id number] Date of Birth: 1961/08/10   Important Message Given:  Other (see comment)  Patient is on comfort care and currently being followed by hospice. Out of respect for the patient and family no Important Message from [facility] given.    Olegario Messier A Bettyjo Lundblad 08/26/2023, 9:30 AM

## 2023-08-26 NOTE — Progress Notes (Signed)
Palliative Medicine [facility] at [facility] [phone]) [phone] [phone]   Name: Stacie Kennedy Date: 08/26/2023 MRN: [id number]  DOB: 22-Apr-1961  Patient Care Team: Earna Coder, MD as PCP - General (Internal Medicine) Glory Buff, RN as Oncology Nurse Navigator    REASON FOR CONSULTATION: Stacie Kennedy is a 62 y.o. 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  Palliative care was consulted to address goals.    CODE STATUS: DNR  PAST MEDICAL HISTORY: Past Medical History:  Diagnosis Date   Cancer (HCC)    lung cancer   Cancer associated pain    Depression    Duodenitis    Dyspnea    Family history of cancer    High cholesterol    Personal history of colonic polyps    Pre-diabetes    Umbilical hernia    UTI (urinary tract infection)     PAST SURGICAL HISTORY:  Past Surgical History:  Procedure Laterality Date   COLONOSCOPY WITH PROPOFOL N/A 03/24/2021   Procedure: COLONOSCOPY WITH PROPOFOL;  Surgeon: Wyline Mood, MD;  Location: [facility] ENDOSCOPY;  Service: Gastroenterology;  Laterality: N/A;   INTERCOSTAL NERVE BLOCK  09/19/2020   Procedure: INTERCOSTAL NERVE BLOCK;  Surgeon: Loreli Slot, MD;  Location: [facility] OR;  Service: Thoracic;;   IR IMAGING GUIDED PORT INSERTION  09/23/2021   LUNG LOBECTOMY Right    LUNG REMOVAL, PARTIAL Right 09/19/2020   NODE DISSECTION Right 09/19/2020   Procedure: NODE DISSECTION;  Surgeon: Loreli Slot, MD;  Location: [facility] OR;  Service: Thoracic;  Laterality: Right;   VIDEO BRONCHOSCOPY N/A 07/08/2021   Procedure: VIDEO BRONCHOSCOPY;  Surgeon: Loreli Slot, MD;  Location: [facility] OR;  Service: Thoracic;  Laterality: N/A;    HEMATOLOGY/ONCOLOGY HISTORY:  Oncology History Overview Note  # NOV  -[id number] [LUNG CANCER SCREENING PROGRAM]-18 mm right upper lobe lung nodule; DEC 2021- s/p right upper lobectomy [Dr. Dorris Fetch; [place]]; STAGE: I [pT-18 mm; LN-12=0]; predominant large cell neuroendocrine; minor adenocarcinoma.  DeclineD adjuvant chemotherapy.  #Recurrent/stage IV cancer NOV 2022- PET scan-scapular lesion liver lesion gastrohepatic lymphadenopathy.  11/21- start RT to right scapular lesion  # DEC 3rd, 2022- CARBO=ETOP+TECEN; udenyca #1; SEP 2023- STOP Tencetriq- progression  SEP 2023-liver biopsy shows neuroendocrine carcinoma/large cell; no adenocarcinoma noted.  Discontinue Tecentriq any progression of disease.  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   # OCTOBER, 2023-s /p carboplatin etoposide x4 cycles- without immunotherapy [progressed while on Atezo].  CT scan FEB 1st, 2024- Two new solid pulmonary nodules, largest 0.8 cm in the posterior left upper lobe, suspicious for new pulmonary metastases. Two enlarging left liver metastases, including substantial growth of the dominant bulky 9.7 cm segment 3 left liver metastasis. Stable expansile mixed lytic and sclerotic right scapular body metastasis.  # FEB 2024- Lubrinectidin cycle #3-progressive disease-noted; discontinue Lubrinectidin.   # MAY 3rd, 2024- IPI-1+NIVO-3 [KEPASAKE]- cycle #1- CT JUNE 27th-  CT CAP-  Multiple new and enlarged bilateral pulmonary nodules, consistent with worsened pulmonary metastatic disease; Continued interval enlargement of very bulky metastases occupying the left lobe of the liver. Unchanged mixed lytic and sclerotic osseous metastatic lesion of the right scapular body.  DISCONTINUE IPI+ NIVO given the progressive disease.  # JULY 8th, 2024- Gemcitabine x2 ccyles- AUG 14th CT CAP- PROGRESSIVE.   # #  MAY 2023- Right swollen eye/orbital inflammation/uveitis s/p Zometa status post steroids improved.-reviewed literature case reports noted; less concerning for tumor  involvement.  DISCONTINUE ZOMETA.  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  Visceral Pleura Invasion: Not identified  Direct Invasion of Adjacent Structures: No adjacent structures present  Lymphovascular Invasion: Not identified  Margins: All margins negative for invasive carcinoma       Closest Margin(s) to Invasive Carcinoma: Bronchovascular margin  Treatment Effect: No known presurgical therapy  Regional Lymph Nodes:       Number of Lymph Nodes Involved: 0                            Nodal Sites with Tumor: Not applicable       Number of Lymph Nodes Examined: 12      Primary cancer of right upper lobe of lung (HCC)  11/03/2020 Initial Diagnosis   Primary cancer of right upper lobe of lung (HCC)   11/03/2020 Cancer Staging   Staging form: Lung, AJCC 8th Edition - Pathologic: Stage IA3 (pT1c, pN0, cM0) - Signed by Earna Coder, MD on 11/04/2020   09/14/2021 - 04/30/2022 Chemotherapy   Patient is on Treatment Plan : LUNG SCLC Carboplatin + Etoposide + Atezolizumab Induction q21d / Atezolizumab Maintenance q21d     09/15/2021 - 06/18/2022 Chemotherapy   Patient is on Treatment Plan : LUNG SCLC Carboplatin + Etoposide + Atezolizumab Induction q21d x 4 cycles / Atezolizumab Maintenance q21d     11/09/2021 Cancer Staging   Staging form: Lung, AJCC 8th Edition - Pathologic: Stage IVB (pTX, pNX, cM1c) - Signed by Earna Coder, MD on 11/09/2021   06/22/2022 Pathology Results   Liver biopsy showed metastatic cancer with neuroendocrine features    08/09/2022 - 10/22/2022 Chemotherapy   Patient is on Treatment Plan : BRAIN Carboplatin (AUC 5)  D1 + Etoposide (120) D1-3 q28d     11/26/2022 - 01/10/2023 Chemotherapy   Patient is on Treatment Plan : LUNG SMALL CELL Lurbinectedin q21d     02/11/2023 - 03/25/2023 Chemotherapy   Patient is on Treatment Plan : MELANOMA Nivolumab (3) + Ipilimumab (1) q21d / Nivolumab (240) q14d     04/18/2023 - 05/16/2023 Chemotherapy   Patient is on Treatment Plan : LUNG Gemcitabine (1000) D1,8 q21d       ALLERGIES:  has No Known Allergies.  MEDICATIONS:  Current Facility-Administered Medications  Medication Dose Route Frequency Provider Last Rate Last Admin   atropine 1 % ophthalmic solution 2 drop  2 drop Sublingual TID PRN Ezequiel Essex, NP       docusate sodium (COLACE) capsule 100 mg  100 mg Oral BID PRN Rust-Chester, Cecelia Byars, NP       glycopyrrolate (ROBINUL) tablet 1 mg  1 mg Oral Q4H PRN Raechel Chute, MD       Or   glycopyrrolate (ROBINUL) injection 0.2 mg  0.2 mg Subcutaneous Q4H PRN Raechel Chute, MD       Or   glycopyrrolate (ROBINUL) injection 0.2 mg  0.2 mg Intravenous Q4H PRN Raechel Chute, MD  HYDROmorphone (DILAUDID) 50 mg in 50 mL NS (1mg /mL) premix infusion  0-4 mg/hr Intravenous Continuous Raechel Chute, MD 2 mL/hr at 08/26/23 1202 2 mg/hr at 08/26/23 1202   HYDROmorphone (DILAUDID) bolus via infusion 1 mg  1 mg Intravenous Q15 min PRN Raechel Chute, MD   1 mg at 08/25/23 2318   HYDROmorphone (DILAUDID) injection 1-2 mg  1-2 mg Intravenous Q2H PRN Ezequiel Essex, NP   2 mg at 08/25/23 0716   ipratropium-albuterol (DUONEB) 0.5-2.5 (3) MG/3ML nebulizer solution 3 mL  3 mL Nebulization Q4H PRN Rust-Chester, Cecelia Byars, NP       leptospermum manuka honey (MEDIHONEY) paste 1 Application  1 Application Topical Daily Raechel Chute, MD   1 Application at 08/26/23 0921   lidocaine (LIDODERM) 5 % 2 patch  2 patch Transdermal Q24H Rust-Chester, Cecelia Byars, NP   2 patch at 08/25/23 2300   LORazepam (ATIVAN) injection 2-4 mg  2-4 mg Intravenous Q4H PRN Raechel Chute, MD   2 mg at 08/26/23  0121   Oral care mouth rinse  15 mL Mouth Rinse PRN Rust-Chester, Cecelia Byars, NP       polyethylene glycol (MIRALAX / GLYCOLAX) packet 17 g  17 g Oral Daily PRN Rust-Chester, Micheline Rough L, NP       polyvinyl alcohol (LIQUIFILM TEARS) 1.4 % ophthalmic solution 1 drop  1 drop Both Eyes QID PRN Raechel Chute, MD       sodium chloride flush (NS) 0.9 % injection 10-40 mL  10-40 mL Intracatheter Q12H Dgayli, Lianne Bushy, MD   10 mL at 08/26/23 1610   sodium chloride flush (NS) 0.9 % injection 10-40 mL  10-40 mL Intracatheter PRN Raechel Chute, MD       Facility-Administered Medications Ordered in Other Encounters  Medication Dose Route Frequency Provider Last Rate Last Admin   heparin lock flush 100 UNIT/ML injection            heparin lock flush 100 unit/mL  500 Units Intravenous Once Louretta Shorten R, MD       morphine (PF) 2 MG/ML injection            sodium chloride flush (NS) 0.9 % injection 10 mL  10 mL Intravenous PRN Earna Coder, MD   10 mL at 03/01/22 0857    VITAL SIGNS: BP (!) 89/52 (BP Location: Left Arm)   Pulse 90   Temp (!) 97.5 F (36.4 C)   Resp 16   Ht 5' 7.5" (1.715 m)   Wt 138 lb 10.7 oz (62.9 kg)   SpO2 90%   BMI 21.40 kg/m  Filed Weights   08/21/2023 2028 08/26/2023 2224 08/24/23 0307  Weight: 103 lb 9.9 oz (47 kg) 138 lb 10.7 oz (62.9 kg) 138 lb 10.7 oz (62.9 kg)    Estimated body mass index is 21.4 kg/m as calculated from the following:   Height as of this encounter: 5' 7.5" (1.715 m).   Weight as of this encounter: 138 lb 10.7 oz (62.9 kg).  LABS: CBC:    Component Value Date/Time   WBC 8.0 08/24/2023 0147   HGB 8.1 (L) 08/24/2023 0147   HGB 10.0 (L) 06/30/2023 1004   HGB 15.5 11/26/2020 1214   HCT 24.8 (L) 08/24/2023 0147   HCT 47.6 (H) 11/26/2020 1214   PLT 210 08/24/2023 0147   PLT 273 06/30/2023 1004   PLT 317 11/26/2020 1214   MCV 87.9 08/24/2023 0147   MCV 87 11/26/2020 1214   NEUTROABS 7.4 09/09/2023  1431   NEUTROABS 2.5 11/26/2020 1214    LYMPHSABS 0.5 (L) 08/13/2023 1431   LYMPHSABS 3.6 (H) 11/26/2020 1214   MONOABS 0.6 08/13/2023 1431   EOSABS 0.0 08/19/2023 1431   EOSABS 0.2 11/26/2020 1214   BASOSABS 0.0 08/17/2023 1431   BASOSABS 0.1 11/26/2020 1214   Comprehensive Metabolic Panel:    Component Value Date/Time   NA 132 (L) 08/24/2023 0147   NA 139 11/26/2020 1214   K 4.1 08/24/2023 0147   CL 93 (L) 08/24/2023 0147   CO2 22 08/24/2023 0147   BUN 32 (H) 08/24/2023 0147   BUN 8 11/26/2020 1214   CREATININE 1.02 (H) 08/24/2023 0147   CREATININE 0.47 06/30/2023 1004   GLUCOSE 80 08/24/2023 0147   CALCIUM 7.8 (L) 08/24/2023 0147   AST 69 (H) 08/24/2023 0147   AST 26 06/30/2023 1004   ALT 26 08/24/2023 0147   ALT 11 06/30/2023 1004   ALKPHOS 126 08/24/2023 0147   BILITOT 1.4 (H) 08/24/2023 0147   BILITOT 0.5 06/30/2023 1004   PROT 5.6 (L) 08/24/2023 0147   PROT 6.9 11/26/2020 1214   ALBUMIN 2.3 (L) 08/24/2023 0147   ALBUMIN 4.0 11/26/2020 1214    RADIOGRAPHIC STUDIES: CT Angio Chest PE W and/or Wo Contrast  Result Date: 09/01/2023 CLINICAL DATA:  Bedsore to backside Swelling to the legs EXAM: CT ANGIOGRAPHY CHEST CT ABDOMEN AND PELVIS WITH CONTRAST TECHNIQUE: Multidetector CT imaging of the chest was performed using the standard protocol during bolus administration of intravenous contrast. Multiplanar CT image reconstructions and MIPs were obtained to evaluate the vascular anatomy. Multidetector CT imaging of the abdomen and pelvis was performed using the standard protocol during bolus administration of intravenous contrast. RADIATION DOSE REDUCTION: This exam was performed according to the departmental dose-optimization program which includes automated exposure control, adjustment of the mA and/or kV according to patient size and/or use of iterative reconstruction technique. CONTRAST:  OMNIPAQUE IOHEXOL 350 MG/ML SOLN COMPARISON:  Chest x-ray 08/25/2023, CT 05/23/2023 FINDINGS: CTA CHEST FINDINGS  Cardiovascular: Satisfactory opacification of the pulmonary arteries to the segmental level. Small nonocclusive right middle and lower lobe segmental and sub segmental emboli. Moderate aortic atherosclerosis. No aneurysm or dissection. Normal cardiac size. No pericardial effusion. Mediastinum/Nodes: Midline trachea. No thyroid mass. No suspicious lymph nodes. Esophagus within normal limits. Lungs/Pleura: Emphysema. No acute airspace disease or pleural effusion. Progression of pulmonary metastatic disease. Dominant medial left lung base pulmonary mass measures 5.5 x 3 cm compared with 2.7 x 1.8 cm previously. Peripheral right lower lobe index pulmonary nodule measures 4 by 2.7 cm on series 7, image 93, previously 2.3 x 1.7 cm. Enlargement of numerous additional pulmonary nodules. Right middle lobe collapse. Status post right upper lobectomy. Musculoskeletal: Right scapular metastatic lesion with involvement of adjacent muscle is again noted. Review of the MIP images confirms the above findings. CT ABDOMEN and PELVIS FINDINGS Hepatobiliary: Liver is enlarged. Multiple large hepatic metastatic lesions which appear increased. Index left hepatic lobe mass measures 10.2 by 7.8 cm, compared with 7.3 x 6 cm. Large mass or masses replacing the left hepatic lobe, also increased compared to prior, this measures 21.4 by 14.1 cm previously 18 x 12.2 cm. No calcified gallstone. No biliary dilatation. Pancreas: No inflammatory changes. Posteriorly displaced by mass effect from enlarged liver mass and masses. Spleen: Normal in size without focal abnormality. Adrenals/Urinary Tract: Right adrenal gland within normal limits. 3.1 x 2.7 cm left adrenal mass slightly enlarged and more heterogeneous in appearance, contiguous with suspicious  solid mass measuring 2.5 x 1.7 cm on series 2, image 23 and concerning for metastatic disease. Kidneys show no hydronephrosis. Interim development or enlargement of multiple right renal masses, for  example 2 cm solid upper pole lesion on series 2, image 20, raising concern for metastatic focus. 1.9 cm right retroperitoneal Peri renal mass on series 2, image 17 increased and concerning for metastatic lesion. Ill-defined bilateral foci of hypoenhancement within both kidneys on delayed views. Urinary bladder is unremarkable Stomach/Bowel: Stomach is nonenlarged. No dilated small bowel. No acute bowel wall thickening. Vascular/Lymphatic: Advanced aortic atherosclerosis with irregular mural thrombus and areas of ulcerated plaque throughout the aorta. No suspicious abdominal lymph nodes. Reproductive: Calcified and noncalcified uterine fibroids. No definitive adnexal mass. Other: No free air or significant ascites. Extensive subcutaneous edema consistent with anasarca. Musculoskeletal: No acute osseous abnormality. Increased lobulated mass in the left gluteal subcutaneous soft tissues measuring 1.7 cm on series 2, image 75 consistent with metastatic lesion. Incompletely visualized low-density within the left abductor muscles on series 2 image 88. Review of the MIP images confirms the above findings. IMPRESSION: CHEST CT: 1. Small nonocclusive right segmental and subsegmental pulmonary emboli in this patient is status post right upper lobectomy. 2. Progression of pulmonary metastatic disease. 3. Right scapular metastatic lesion with involvement of adjacent muscle is again noted. ABDOMEN AND PELVIS CT: 1. Progression of hepatic metastatic disease. 2. Interim development or enlargement of multiple right renal and perirenal/retroperitoneal masses consistent with metastatic disease. 3. Increased lobulated mass in the left gluteal subcutaneous soft tissues consistent with metastatic lesion. Increased size and heterogeneity of previously noted left adrenal adenoma, now contiguous with solid enhancing mass and concerning for metastatic disease. 4. Ill-defined bilateral foci of hypoenhancement within both kidneys on delayed  views, question pyelonephritis versus metastatic disease. 5. Extensive subcutaneous edema consistent with anasarca. Incompletely visualized low-density appearance of left abductor muscles which could be due to nonspecific muscle edema or inflammatory process. 6. Aortic atherosclerosis. Aortic Atherosclerosis (ICD10-I70.0) and Emphysema (ICD10-J43.9). Critical Value/emergent results were called by telephone at the time of interpretation on 09/03/2023 at 8:14 pm to provider NEHA RAY , who verbally acknowledged these results. Electronically Signed   By: Jasmine Pang M.D.   On: 09/10/2023 20:14   CT ABDOMEN PELVIS W CONTRAST  Result Date: 08/30/2023 CLINICAL DATA:  Bedsore to backside Swelling to the legs EXAM: CT ANGIOGRAPHY CHEST CT ABDOMEN AND PELVIS WITH CONTRAST TECHNIQUE: Multidetector CT imaging of the chest was performed using the standard protocol during bolus administration of intravenous contrast. Multiplanar CT image reconstructions and MIPs were obtained to evaluate the vascular anatomy. Multidetector CT imaging of the abdomen and pelvis was performed using the standard protocol during bolus administration of intravenous contrast. RADIATION DOSE REDUCTION: This exam was performed according to the departmental dose-optimization program which includes automated exposure control, adjustment of the mA and/or kV according to patient size and/or use of iterative reconstruction technique. CONTRAST:  OMNIPAQUE IOHEXOL 350 MG/ML SOLN COMPARISON:  Chest x-ray 08/18/2023, CT 05/23/2023 FINDINGS: CTA CHEST FINDINGS Cardiovascular: Satisfactory opacification of the pulmonary arteries to the segmental level. Small nonocclusive right middle and lower lobe segmental and sub segmental emboli. Moderate aortic atherosclerosis. No aneurysm or dissection. Normal cardiac size. No pericardial effusion. Mediastinum/Nodes: Midline trachea. No thyroid mass. No suspicious lymph nodes. Esophagus within normal limits.  Lungs/Pleura: Emphysema. No acute airspace disease or pleural effusion. Progression of pulmonary metastatic disease. Dominant medial left lung base pulmonary mass measures 5.5 x 3 cm compared with 2.7  x 1.8 cm previously. Peripheral right lower lobe index pulmonary nodule measures 4 by 2.7 cm on series 7, image 93, previously 2.3 x 1.7 cm. Enlargement of numerous additional pulmonary nodules. Right middle lobe collapse. Status post right upper lobectomy. Musculoskeletal: Right scapular metastatic lesion with involvement of adjacent muscle is again noted. Review of the MIP images confirms the above findings. CT ABDOMEN and PELVIS FINDINGS Hepatobiliary: Liver is enlarged. Multiple large hepatic metastatic lesions which appear increased. Index left hepatic lobe mass measures 10.2 by 7.8 cm, compared with 7.3 x 6 cm. Large mass or masses replacing the left hepatic lobe, also increased compared to prior, this measures 21.4 by 14.1 cm previously 18 x 12.2 cm. No calcified gallstone. No biliary dilatation. Pancreas: No inflammatory changes. Posteriorly displaced by mass effect from enlarged liver mass and masses. Spleen: Normal in size without focal abnormality. Adrenals/Urinary Tract: Right adrenal gland within normal limits. 3.1 x 2.7 cm left adrenal mass slightly enlarged and more heterogeneous in appearance, contiguous with suspicious solid mass measuring 2.5 x 1.7 cm on series 2, image 23 and concerning for metastatic disease. Kidneys show no hydronephrosis. Interim development or enlargement of multiple right renal masses, for example 2 cm solid upper pole lesion on series 2, image 20, raising concern for metastatic focus. 1.9 cm right retroperitoneal Peri renal mass on series 2, image 17 increased and concerning for metastatic lesion. Ill-defined bilateral foci of hypoenhancement within both kidneys on delayed views. Urinary bladder is unremarkable Stomach/Bowel: Stomach is nonenlarged. No dilated small bowel. No  acute bowel wall thickening. Vascular/Lymphatic: Advanced aortic atherosclerosis with irregular mural thrombus and areas of ulcerated plaque throughout the aorta. No suspicious abdominal lymph nodes. Reproductive: Calcified and noncalcified uterine fibroids. No definitive adnexal mass. Other: No free air or significant ascites. Extensive subcutaneous edema consistent with anasarca. Musculoskeletal: No acute osseous abnormality. Increased lobulated mass in the left gluteal subcutaneous soft tissues measuring 1.7 cm on series 2, image 75 consistent with metastatic lesion. Incompletely visualized low-density within the left abductor muscles on series 2 image 88. Review of the MIP images confirms the above findings. IMPRESSION: CHEST CT: 1. Small nonocclusive right segmental and subsegmental pulmonary emboli in this patient is status post right upper lobectomy. 2. Progression of pulmonary metastatic disease. 3. Right scapular metastatic lesion with involvement of adjacent muscle is again noted. ABDOMEN AND PELVIS CT: 1. Progression of hepatic metastatic disease. 2. Interim development or enlargement of multiple right renal and perirenal/retroperitoneal masses consistent with metastatic disease. 3. Increased lobulated mass in the left gluteal subcutaneous soft tissues consistent with metastatic lesion. Increased size and heterogeneity of previously noted left adrenal adenoma, now contiguous with solid enhancing mass and concerning for metastatic disease. 4. Ill-defined bilateral foci of hypoenhancement within both kidneys on delayed views, question pyelonephritis versus metastatic disease. 5. Extensive subcutaneous edema consistent with anasarca. Incompletely visualized low-density appearance of left abductor muscles which could be due to nonspecific muscle edema or inflammatory process. 6. Aortic atherosclerosis. Aortic Atherosclerosis (ICD10-I70.0) and Emphysema (ICD10-J43.9). Critical Value/emergent results were  called by telephone at the time of interpretation on 08/29/2023 at 8:14 pm to provider NEHA RAY , who verbally acknowledged these results. Electronically Signed   By: Jasmine Pang M.D.   On: 09/07/2023 20:14   DG Chest Port 1 View  Result Date: 09/04/2023 CLINICAL DATA:  Weakness leg swelling EXAM: PORTABLE CHEST 1 VIEW COMPARISON:  08/12/2021, CT 05/23/2023 FINDINGS: Right-sided central venous port tip at the cavoatrial region. No acute airspace disease or  pleural effusion. Normal cardiac size. Numerous bilateral pulmonary masses consistent with metastatic disease, suspect increased compared with CT from August. Emphysema. IMPRESSION: Emphysema. Numerous bilateral pulmonary masses consistent with metastatic disease, appears increased compared with CT from August. Electronically Signed   By: Jasmine Pang M.D.   On: 08/27/2023 18:38   CT Head Wo Contrast  Result Date: 09/09/2023 CLINICAL DATA:  Mental status change of unknown cause. EXAM: CT HEAD WITHOUT CONTRAST TECHNIQUE: Contiguous axial images were obtained from the base of the skull through the vertex without intravenous contrast. RADIATION DOSE REDUCTION: This exam was performed according to the departmental dose-optimization program which includes automated exposure control, adjustment of the mA and/or kV according to patient size and/or use of iterative reconstruction technique. COMPARISON:  MRI 05/11/2023.  CT 03/08/2022. FINDINGS: Brain: No abnormality seen affecting the brainstem. 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 Cerebral hemispheres appear intrinsically unremarkable without any evidence of prior stroke, intra-axial mass lesion, hemorrhage or hydrocephalus. There has been marked enlargement of a metastatic lesion affecting the calvarium at the lateral margin of the middle cranial fossa on the right. Previously, this lesion measured up to 2 cm in thickness, with both external and internal soft tissue component. Now, the  thickness is up to 4.3 cm. This bulges much more into the lateral aspect of the middle cranial fossa and has some mass-effect upon the right temporal lobe. Second bone lesion previously seen in the occiput put has grown extensively. In July, this only measured a cm in size. Presently, the infiltrating bone component measures up to 4.4 cm, with tumor bulging exterior to the calvarium an inward towards the brain. There could be involvement of the superior sagittal sinus. Maximal thickness of tumor is 2.5 cm. No third calvarial lesion is seen. Vascular: There is atherosclerotic calcification of the major vessels at the base of the brain. Skull: See above. Sinuses/Orbits: Clear/normal Other: None IMPRESSION: 1. Marked enlargement of a metastatic lesion affecting the calvarium at the lateral margin of the middle cranial fossa on the right. Previously, this lesion measured up to 2 cm in thickness, with both external and internal soft tissue component. Now, the thickness is up to 4.3 cm. This bulges much more into the lateral aspect of the middle cranial fossa and has some mass-effect upon the right temporal lobe. 2. Second bone lesion previously seen in the occiput put has grown extensively. In July, this only measured a cm in size. Presently, the infiltrating bone component measures up to 4.4 cm, with tumor bulging external to the calvarium and inward towards the brain. There could be involvement of the superior sagittal sinus. Maximal thickness of tumor is 2.5 cm. 3. No third calvarial lesion is seen. 4. 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 Electronically Signed   By: Paulina Fusi M.D.   On: 09/06/2023 16:49    PERFORMANCE STATUS (ECOG) : 4 - Bedbound  Review of Systems Unable to complete  Physical Exam General: Ill-appearing Pulmonary: Unlabored Extremities: no edema, no joint deformities Skin: no rashes Neurological: Unresponsive  IMPRESSION: Patient admitted with failure to thrive  and family opted to transition to comfort measures on 11/14.  Follow-up visit.  Patient on hydromorphone infusion and comfortable appearing at this time.  Emotional support provided to patient's sister.  Anticipate in-hospital death.  PLAN: -Comfort care   Time Total: 15 minutes  Visit consisted of counseling and education dealing with the complex and emotionally intense issues of symptom management and palliative  care in the setting of serious and potentially life-threatening illness.Greater than 50%  of this time was spent counseling and coordinating care related to the above assessment and plan.  Signed by: Laurette Schimke, PhD, NP-C

## 2023-08-26 NOTE — Progress Notes (Signed)
[facility]- [facility] Patient  [name] is a current hospice patient, with hospice diagnosis of malignant neoplasm of upper lobe, right bronchus or lung. She was admitted 08/15/2023 with diagnosis of respiratory failure with pulmonary embolus and metastatic lung cancer. [facility] was notified by Stacie Kennedy, her sister that patient was enroute to ED. Per Dr. Patric Kennedy, hospice physician, this is a related hospital admission.   Stacie Kennedy is resting with eyes closed and in no apparent distress upon assessment and there is no one at bedside. Exchanged report with bedside nurse and palliative provider, per nurse patient's dilaudid infusion was increased last night due to her pain and she has been comfortable since then.   She remains inpatient appropriate due to need for IV management of pain.   Vital Signs: 97.5/86/16   89/52    spO2 90% on 2L nasal cannula I/O: 72/150 Abnormal labs: none new Diagnostics: none new IV/PRN Meds: Hydromorphone 2mg /H IV continuous, Hydromorphone 1mg  bolus IV, Ativan 1mg  IV PROBLEM LIST as per Progress note 11.15 Dr. 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  Will continue full comfort measures at this time.  Relaxed visitation.  Continue hydromorphone gtt.  As needed benzodiazepines.  All medications not focused on patient comfort have been discontinued.  DNR.  Anticipate in hospital demise.  Discharge Planning patient will likely be a hospital death Family contact:  Talked with sister IDT: Updated   Goals of Care: DNR  Please don't hesitate to reach out for any hospice related question or concerns. Stacie Kennedy, BSN RN Hospice hospital liaison [phone]

## 2023-08-26 NOTE — Progress Notes (Signed)
PROGRESS NOTE    Stacie Kennedy  WGN:[id number] DOB: Feb 14, 1961 DOA: 08/20/2023 PCP: Earna Coder, MD    Brief Narrative:  62 yo F presenting to [facility] ED via EMS from home on 08/15/2023 for evaluation of leg swelling and back pain from a sacral wound.   History obtained per chart review, patient bedside report and conversation with sister and niece. 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  Per oncology notation patient's initial diagnosis of primary cancer of the RUL in January 2022 s/p lobectomy.  She is post multiple rounds of chemotherapy.  In November 2022 PET scan revealed right scapular lesion status post radiation therapy. Liver biopsy in September 2023 revealed metastatic cancer with neuroendocrine features.  This was followed by four other runs of chemotherapy ending in August 2024.  Two new brain lesions were found in 04/2023. She was referred to palliative care and more recently hospice.  Patient was initially admitted to the intensive care service.  Patient was significant cancer related cachexia and significant wasting and very significant and excruciating pain.  Pain and care requirements exceed what family can provide at home.  Patient was started on hydromorphone gtt. with as needed benzodiazepines.  Good improvement in pain management.   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  Will continue full comfort measures at this time.  Relaxed visitation.  Continue hydromorphone gtt.  As needed benzodiazepines.  All medications not focused on patient comfort have been discontinued.  DNR.  Anticipate in hospital demise.   DVT prophylaxis: None/comfort Code Status:  DNR-comfort Family Communication:None at bedside Disposition Plan: Status is: Inpatient Remains inpatient appropriate because: Full comfort measures on Dilaudid gtt.   Level of care: Med-Surg  Consultants:  None  Procedures:  None  Antimicrobials: None   Subjective: Seen and examined.  Minimally verbally responsive.  Objective: Vitals:   08/24/23 1800 08/24/23 1955 08/25/23 0826 08/26/23 0928  BP:  (!) 87/50 (!) 89/52   Pulse: 85 92 90   Resp: 15 16 16 16   Temp:  97.6 F (36.4 C) (!) 97.5 F (36.4 C)   TempSrc:      SpO2: (!) 81% (!) 86% 90%   Weight:      Height:        Intake/Output Summary (Last 24 hours) at 08/26/2023 1202 Last data filed at 08/26/2023 1202 Gross per 24 hour  Intake 63.21 ml  Output --  Net 63.21 ml   Filed Weights   09/06/2023 2028 08/14/2023 2224 08/24/23 0307  Weight: 47 kg 62.9 kg 62.9 kg    Examination:  Limited exam due to comfort measure status  General: Appears weak and fatigued.  No acute distress CV: S1-S2, RRR, no murmurs Pulmonary: Bibasilar crackles.  Shallow/poor respiratory effort.  2 L    Data Reviewed: I have personally reviewed following labs and imaging studies  CBC: Recent Labs  Lab 08/20/2023 1431 08/24/23 0147  WBC 8.6 8.0  NEUTROABS 7.4  --   HGB 9.5* 8.1*  HCT 27.9* 24.8*  MCV 87.2 87.9  PLT 242 210   Basic Metabolic Panel: Recent Labs  Lab 09/01/2023 1431 09/08/2023 2311 08/24/23 0147 08/24/23 0518  NA 129*  --  132*  --  K 4.6  --  4.1  --   CL 90*  --  93*  --   CO2 24  --  22  --   GLUCOSE 62*  --  80  --   BUN 34*  --  32*  --   CREATININE 1.32*  --  1.02*  --   CALCIUM 7.8*  --  7.8*  --   MG  --  1.4* 1.3* 1.9  PHOS  --  3.5 3.2  --    GFR: Estimated Creatinine Clearance: 56.7 mL/min (A) (by C-G formula based on SCr of 1.02 mg/dL (H)). Liver Function Tests: Recent Labs  Lab 08/17/2023 1431 08/24/23 0147  AST 96* 69*  ALT 31 26  ALKPHOS 162* 126  BILITOT 1.6* 1.4*  PROT 6.4*  5.6*  ALBUMIN 2.6* 2.3*   No results for input(s): "LIPASE", "AMYLASE" in the last 168 hours. No results for input(s): "AMMONIA" in the last 168 hours. Coagulation Profile: Recent Labs  Lab 09/08/2023 2042  INR 1.6*   Cardiac Enzymes: No results for input(s): "CKTOTAL", "CKMB", "CKMBINDEX", "TROPONINI" in the last 168 hours. BNP (last 3 results) No results for input(s): "PROBNP" in the last 8760 hours. HbA1C: Recent Labs    08/24/23 0147  HGBA1C 5.2   CBG: Recent Labs  Lab 08/15/2023 2224 08/19/2023 2357 08/24/23 0359 08/24/23 0732 08/24/23 1134  GLUCAP 95 69* 90 88 95   Lipid Profile: No results for input(s): "CHOL", "HDL", "LDLCALC", "TRIG", "CHOLHDL", "LDLDIRECT" in the last 72 hours. Thyroid Function Tests: No results for input(s): "TSH", "T4TOTAL", "FREET4", "T3FREE", "THYROIDAB" in the last 72 hours. Anemia Panel: No results for input(s): "VITAMINB12", "FOLATE", "FERRITIN", "TIBC", "IRON", "RETICCTPCT" in the last 72 hours. Sepsis Labs: Recent Labs  Lab 08/18/2023 2311 08/24/23 0147 08/24/23 0518  PROCALCITON 0.65 0.61 0.55  LATICACIDVEN 2.0* 2.0* 1.8    Recent Results (from the past 240 hour(s))  MRSA Next Gen by PCR, Nasal     Status: None   Collection Time: 09/08/2023 10:30 PM   Specimen: Nasal Mucosa; Nasal Swab  Result Value Ref Range Status   MRSA by PCR Next Gen NOT DETECTED NOT DETECTED Final    Comment: (NOTE) The GeneXpert MRSA Assay (FDA approved for NASAL specimens only), is one component of a comprehensive MRSA colonization surveillance program. It is not intended to diagnose MRSA infection nor to guide or monitor treatment for MRSA infections. Test performance is not FDA approved in patients less than 59 years old. Performed at [facility], [address] [place], [place] [address]   SARS Coronavirus 2 by RT PCR (hospital order, performed in [facility] hospital lab) *cepheid single result test* Anterior Nasal Swab     Status: None    Collection Time: 08/24/23  1:50 AM   Specimen: Anterior Nasal Swab  Result Value Ref Range Status   SARS Coronavirus 2 by RT PCR NEGATIVE NEGATIVE Final    Comment: (NOTE) SARS-CoV-2 target nucleic acids are NOT DETECTED.  The SARS-CoV-2 RNA is generally detectable in upper and lower respiratory specimens during the acute phase of infection. The lowest concentration of SARS-CoV-2 viral copies this assay can detect is 250 copies / mL. A negative result does not preclude SARS-CoV-2 infection and should not be used as the sole basis for treatment or other patient management decisions.  A negative result may occur with improper specimen collection / handling, submission of specimen other than nasopharyngeal swab, presence of viral mutation(s) within the areas targeted by this  assay, and inadequate number of viral copies (<250 copies / mL). A negative result must be combined with clinical observations, patient history, and epidemiological information.  Fact Sheet for Patients:   RoadLapTop.co.za  Fact Sheet for Healthcare Providers: http://kim-miller.com/  This test is not yet approved or  cleared by the [place] FDA and has been authorized for detection and/or diagnosis of SARS-CoV-2 by FDA under an Emergency Use Authorization (EUA).  This EUA will remain in effect (meaning this test can be used) for the duration of the COVID-19 declaration under Section 564(b)(1) of the Act, 21 U.S.C. section 360bbb-3(b)(1), unless the authorization is terminated or revoked sooner.  Performed at [facility], [address] [place], [place] [address]   Urine Culture (for pregnant, neutropenic or urologic patients or patients with an indwelling urinary catheter)     Status: Abnormal   Collection Time: 08/24/23  4:46 AM   Specimen: Urine, Clean Catch  Result Value Ref Range Status   Specimen Description   Final    URINE, CLEAN  CATCH Performed at [facility], [address] [place], [place] [address]    Special Requests   Final    NONE Performed at [facility], [address] [place], [place] [address]    Culture MULTIPLE SPECIES PRESENT, SUGGEST RECOLLECTION (A)  Final   Report Status 08/25/2023 FINAL  Final         Radiology Studies: No results found.      Scheduled Meds:  leptospermum manuka honey  1 Application Topical Daily   lidocaine  2 patch Transdermal Q24H   sodium chloride flush  10-40 mL Intracatheter Q12H   Continuous Infusions:  HYDROmorphone 2 mg/hr (08/26/23 1202)     LOS: 3 days   Tresa Moore, MD [facility]   If 7PM-7AM, please contact night-coverage  08/26/2023, 12:02 PM

## 2023-08-26 NOTE — Plan of Care (Signed)

## 2023-08-27 DIAGNOSIS — C7951 Secondary malignant neoplasm of bone: Secondary | ICD-10-CM | POA: Diagnosis not present

## 2023-08-27 DIAGNOSIS — C3490 Malignant neoplasm of unspecified part of unspecified bronchus or lung: Secondary | ICD-10-CM | POA: Diagnosis not present

## 2023-08-27 NOTE — Plan of Care (Signed)
  Problem: Education: Goal: Knowledge of General Education information will improve Description: Including pain rating scale, medication(s)/side effects and non-pharmacologic comfort measures Outcome: Progressing   Problem: Pain Management: Goal: General experience of comfort will improve 08/27/2023 0433 by Lannette Donath, RN Outcome: Progressing 08/27/2023 0432 by Lannette Donath, RN Outcome: Progressing   Problem: Safety: Goal: Ability to remain free from injury will improve Outcome: Progressing

## 2023-08-27 NOTE — Progress Notes (Addendum)
[facility]- [facility] Patient   Stacie Kennedy is a current hospice patient, with hospice diagnosis of malignant neoplasm of upper lobe, right bronchus or lung. She was admitted 09/01/2023 with diagnosis of respiratory failure with pulmonary embolus and metastatic lung cancer. [facility] was notified by Misty Stanley, her sister that patient was enroute to ED. Per Dr. Patric Dykes, hospice physician, this is a related hospital admission.    Patient resting with eyes closed.  Dilaudid gtt infusing.  Family concerned today that patient is not responding and was asking about possible oversedation.  Education provided that patient is receiving the lowest dose of IV medication that kept her pain managed.  MD weaned patient's meds down to 1.5mg /hour per request of family, however was brought back to 2mg /hour due to symptom return and also required added robinul.  Patient is appropriate for inpatient level of care for acute symptom management of pain requiring IV gtt of Dilaudid 2mg /hour continuous infusion and now being given robinul for secretions.   Vital Signs: 97.6 (oral temp), BP 78/49, P 59, R 16  Oxi 93% 2L/[place] I/O: 72/150 Abnormal labs: none new Diagnostics: none new  IV/PRN Meds:  Dilaudid gtt 2mg /hour continuous Robinul 0.2mg  IV @ 1455 for secretions  PROBLEM LIST as per Progress note Dr. 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   Active Problems:     -Acute on chronic hypoxic respiratory failure (HCC)    -Metastatic malignant neoplasm (HCC)     -Pulmonary embolus (HCC)     -Palliative care encounter- supportive as needed   Patient was transition to comfort measures on 11.14.24  Discharge Planning-  anticipate hospital death Family contact:  Daughter has been at bedside per floor nurse but is not currently at bedside IDT: Updated   Goals of Care: DNR   Please call with any hospice related questions or concerns.  Norris Cross, RN Nurse  Liaison [phone]

## 2023-08-27 NOTE — Progress Notes (Signed)
  Chaplain On-Call responded to a page from [employer] who reported the request by the patient's family to speak with a Chaplain.  Chaplain received medical update about the patient from RN Patrecia Pour entered the patient's room, and no family is present RN Lequita Halt will let the patient's Nurse know that I was here, and that I am available if the family requests again.  Chaplain Evelena Peat M.Div., [facility]

## 2023-08-27 NOTE — Progress Notes (Signed)
PROGRESS NOTE    Stacie Kennedy  ZOX:[id number] DOB: 09/02/1961 DOA: 09/02/2023 PCP: Earna Coder, MD    Brief Narrative:  62 yo F presenting to [facility] ED via EMS from home on 09/10/2023 for evaluation of leg swelling and back pain from a sacral wound.   History obtained per chart review, patient bedside report and conversation with sister and niece. 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  Per oncology notation patient's initial diagnosis of primary cancer of the RUL in January 2022 s/p lobectomy.  She is post multiple rounds of chemotherapy.  In November 2022 PET scan revealed right scapular lesion status post radiation therapy. Liver biopsy in September 2023 revealed metastatic cancer with neuroendocrine features.  This was followed by four other runs of chemotherapy ending in August 2024.  Two new brain lesions were found in 04/2023. She was referred to palliative care and more recently hospice.  Patient was initially admitted to the intensive care service.  Patient was significant cancer related cachexia and significant wasting and very significant and excruciating pain.  Pain and care requirements exceed what family can provide at home.  Patient was started on hydromorphone gtt. with as needed benzodiazepines.  Good improvement in pain management.   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  Will continue full comfort measures at this time.  Relaxed visitation.  Continue hydromorphone gtt. daughter at bedside concerned that patient may be oversedated.  Explained that pain and symptom control was primary objective.  Will attempt to wean Dilaudid gtt. to 1.5 mg/h.  Daughter understands  and agrees that if pain is uncontrolled on this rate we will need to escalate back to 2 mg an hour.  DNR.  Anticipated hospital demise.    DVT prophylaxis: None/comfort Code Status: DNR-comfort Family Communication:None at bedside Disposition Plan: Status is: Inpatient Remains inpatient appropriate because: Full comfort measures on Dilaudid gtt.   Level of care: Med-Surg  Consultants:  None  Procedures:  None  Antimicrobials: None   Subjective: Seen and examined.  Verbally unresponsive.  Objective: Vitals:   08/26/23 0928 08/26/23 1633 08/27/23 0422 08/27/23 0700  BP:  (!) 79/53  (!) 78/49  Pulse:  100  (!) 112  Resp: 16 16 15 16   Temp:  97.6 F (36.4 C)  97.6 F (36.4 C)  TempSrc:  Axillary  Oral  SpO2:  94%  93%  Weight:      Height:        Intake/Output Summary (Last 24 hours) at 08/27/2023 1129 Last data filed at 08/27/2023 0400 Gross per 24 hour  Intake 17.91 ml  Output --  Net 17.91 ml   Filed Weights   09/05/2023 2028 08/21/2023 2224 08/24/23 0307  Weight: 47 kg 62.9 kg 62.9 kg    Examination:  Limited exam due to comfort measure status  General: Verbally unresponsive.  Ill-appearing.  No acute distress. CV: S1-S2, RRR, no murmurs Pulmonary: Bibasilar crackles.  Shallow/poor respiratory effort.  2 L    Data Reviewed: I have personally reviewed following labs and imaging studies  CBC: Recent Labs  Lab 08/31/2023 1431 08/24/23 0147  WBC 8.6 8.0  NEUTROABS 7.4  --   HGB 9.5* 8.1*  HCT 27.9*  24.8*  MCV 87.2 87.9  PLT 242 210   Basic Metabolic Panel: Recent Labs  Lab 08/29/2023 1431 09/04/2023 2311 08/24/23 0147 08/24/23 0518  NA 129*  --  132*  --   K 4.6  --  4.1  --   CL 90*  --  93*  --   CO2 24  --  22  --   GLUCOSE 62*  --  80  --   BUN 34*  --  32*  --   CREATININE 1.32*  --  1.02*  --   CALCIUM 7.8*  --  7.8*  --   MG  --  1.4* 1.3* 1.9  PHOS  --  3.5 3.2  --    GFR: Estimated Creatinine Clearance: 56.7 mL/min (A) (by C-G  formula based on SCr of 1.02 mg/dL (H)). Liver Function Tests: Recent Labs  Lab 08/20/2023 1431 08/24/23 0147  AST 96* 69*  ALT 31 26  ALKPHOS 162* 126  BILITOT 1.6* 1.4*  PROT 6.4* 5.6*  ALBUMIN 2.6* 2.3*   No results for input(s): "LIPASE", "AMYLASE" in the last 168 hours. No results for input(s): "AMMONIA" in the last 168 hours. Coagulation Profile: Recent Labs  Lab 09/04/2023 2042  INR 1.6*   Cardiac Enzymes: No results for input(s): "CKTOTAL", "CKMB", "CKMBINDEX", "TROPONINI" in the last 168 hours. BNP (last 3 results) No results for input(s): "PROBNP" in the last 8760 hours. HbA1C: No results for input(s): "HGBA1C" in the last 72 hours.  CBG: Recent Labs  Lab 08/15/2023 2224 09/04/2023 2357 08/24/23 0359 08/24/23 0732 08/24/23 1134  GLUCAP 95 69* 90 88 95   Lipid Profile: No results for input(s): "CHOL", "HDL", "LDLCALC", "TRIG", "CHOLHDL", "LDLDIRECT" in the last 72 hours. Thyroid Function Tests: No results for input(s): "TSH", "T4TOTAL", "FREET4", "T3FREE", "THYROIDAB" in the last 72 hours. Anemia Panel: No results for input(s): "VITAMINB12", "FOLATE", "FERRITIN", "TIBC", "IRON", "RETICCTPCT" in the last 72 hours. Sepsis Labs: Recent Labs  Lab 09/06/2023 2311 08/24/23 0147 08/24/23 0518  PROCALCITON 0.65 0.61 0.55  LATICACIDVEN 2.0* 2.0* 1.8    Recent Results (from the past 240 hour(s))  MRSA Next Gen by PCR, Nasal     Status: None   Collection Time: 08/12/2023 10:30 PM   Specimen: Nasal Mucosa; Nasal Swab  Result Value Ref Range Status   MRSA by PCR Next Gen NOT DETECTED NOT DETECTED Final    Comment: (NOTE) The GeneXpert MRSA Assay (FDA approved for NASAL specimens only), is one component of a comprehensive MRSA colonization surveillance program. It is not intended to diagnose MRSA infection nor to guide or monitor treatment for MRSA infections. Test performance is not FDA approved in patients less than 60 years old. Performed at [facility]  [address] [place], [place] [address]   SARS Coronavirus 2 by RT PCR (hospital order, performed in [facility] hospital lab) *cepheid single result test* Anterior Nasal Swab     Status: None   Collection Time: 08/24/23  1:50 AM   Specimen: Anterior Nasal Swab  Result Value Ref Range Status   SARS Coronavirus 2 by RT PCR NEGATIVE NEGATIVE Final    Comment: (NOTE) SARS-CoV-2 target nucleic acids are NOT DETECTED.  The SARS-CoV-2 RNA is generally detectable in upper and lower respiratory specimens during the acute phase of infection. The lowest concentration of SARS-CoV-2 viral copies this assay can detect is 250 copies / mL. A negative result does not preclude SARS-CoV-2 infection and should not be used as the sole basis  for treatment or other patient management decisions.  A negative result may occur with improper specimen collection / handling, submission of specimen other than nasopharyngeal swab, presence of viral mutation(s) within the areas targeted by this assay, and inadequate number of viral copies (<250 copies / mL). A negative result must be combined with clinical observations, patient history, and epidemiological information.  Fact Sheet for Patients:   RoadLapTop.co.za  Fact Sheet for Healthcare Providers: http://kim-miller.com/  This test is not yet approved or  cleared by the [place] FDA and has been authorized for detection and/or diagnosis of SARS-CoV-2 by FDA under an Emergency Use Authorization (EUA).  This EUA will remain in effect (meaning this test can be used) for the duration of the COVID-19 declaration under Section 564(b)(1) of the Act, 21 U.S.C. section 360bbb-3(b)(1), unless the authorization is terminated or revoked sooner.  Performed at [facility], [address] [place], [place] [address]   Urine Culture (for pregnant, neutropenic or urologic patients or patients with an  indwelling urinary catheter)     Status: Abnormal   Collection Time: 08/24/23  4:46 AM   Specimen: Urine, Clean Catch  Result Value Ref Range Status   Specimen Description   Final    URINE, CLEAN CATCH Performed at [facility], [address] [place], [place] [address]    Special Requests   Final    NONE Performed at [facility], [address] [place], [place] [address]    Culture MULTIPLE SPECIES PRESENT, SUGGEST RECOLLECTION (A)  Final   Report Status 08/25/2023 FINAL  Final         Radiology Studies: No results found.      Scheduled Meds:  leptospermum manuka honey  1 Application Topical Daily   lidocaine  2 patch Transdermal Q24H   sodium chloride flush  10-40 mL Intracatheter Q12H   Continuous Infusions:  HYDROmorphone 1.5 mg/hr (08/27/23 1000)     LOS: 4 days   Tresa Moore, MD [facility]   If 7PM-7AM, please contact night-coverage  08/27/2023, 11:29 AM

## 2023-08-27 NOTE — Plan of Care (Signed)
  Problem: Pain Management: Goal: General experience of comfort will improve Outcome: Progressing   Problem: Safety: Goal: Ability to remain free from injury will improve Outcome: Progressing

## 2023-08-30 ENCOUNTER — Other Ambulatory Visit: Payer: Medicare Other

## 2023-08-30 ENCOUNTER — Ambulatory Visit: Payer: Medicare Other | Admitting: Internal Medicine

## 2023-08-30 ENCOUNTER — Ambulatory Visit: Payer: Medicare Other | Attending: Radiation Oncology | Admitting: Radiation Oncology

## 2023-09-11 NOTE — Progress Notes (Signed)
This RN assumed care of pt at approximately 0730. Pt's niece and son at bedside. Emotional support provided to family. Family spent time grieving in room.   This RN called pt's sister, Misty Stanley, and obtained funeral home information (documented in flowsheet and physical form). [employer] notified by nightshift RN---pt is a potential eye donor. Misty Stanley was also notified of this information, and stated that she would like to decline eye donation for pt. Information documented in flowsheet.    Post-mortem care provided by this RN and Ethelene Browns, NT. All PIVs removed and implanted port disaccessed. No patient belongings at bedside.Transport notified.

## 2023-09-11 NOTE — Progress Notes (Signed)
  Chaplain On-Call received a page at 0615 from [facility], RN, who reported the patient's death.  Chaplain arrived on the Unit and met the patient's son Ethelene Browns and her niece Deanna Artis. They lovingly described the patient's life and their close relationships with her. Chaplain offered time for life review and outward grieving.  Chaplain provided spiritual and emotional support and prayer.  Chaplain Evelena Peat M.Div., [facility]

## 2023-09-11 NOTE — Death Summary Note (Addendum)
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   History obtained per chart review, patient bedside report and conversation with sister and niece. 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  Per oncology notation patient's initial diagnosis of primary cancer of the RUL in January 2022 s/p lobectomy.  She is post multiple rounds of chemotherapy.  In Dec 07, 2022PET scan revealed right scapular lesion status post radiation therapy. Liver biopsy in September 2023 revealed metastatic cancer with neuroendocrine features.  This was followed by four other runs of chemotherapy ending in August 2024.  Two new brain lesions were found in 04/2023. She was referred to palliative care and more recently hospice.   Patient was initially admitted to the intensive care service.  Patient was significant cancer related cachexia and significant wasting and very significant and excruciating pain.  Pain and care requirements exceed what family can provide at home.  Patient was started on hydromorphone gtt. with as needed benzodiazepines.  Good improvement in pain management.   Assessment and Plan: No notes have been filed under this hospital service. Service: Hospitalist   Patient  was transitioned to comfort measures on 11/14.  Will continue full comfort measures at this time.  Relaxed visitation.  Continue hydromorphone gtt. daughter at bedside concerned that patient may be oversedated.  Explained that pain and symptom control was primary objective.  Will attempt to wean Dilaudid gtt. to 1.5 mg/h.  Daughter understands and agrees that if pain is uncontrolled on this rate we will need to escalate back to 2 mg an hour.  DNR.  Anticipated hospital demise.    Notified by night RN staff and overnight cross cover that patient had passed away.  Death verified by 2 RN.  Condolences provided to the family.  Chaplain paged to bedside  Time of death: 0615 on 09/10/23      Procedures: None  Consultations: Oncology, palliative care, PCCM  The results of significant diagnostics from this hospitalization (including imaging, microbiology, ancillary and laboratory) are listed below for reference.   Significant Diagnostic Studies: CT Angio Chest PE W and/or Wo Contrast  Result Date: 09/05/23 CLINICAL DATA:  Bedsore to backside Swelling to the legs EXAM: CT ANGIOGRAPHY CHEST CT ABDOMEN AND PELVIS WITH CONTRAST TECHNIQUE: Multidetector CT imaging of the chest was performed using the standard protocol during bolus administration of intravenous contrast. Multiplanar CT image reconstructions and MIPs were obtained to evaluate the vascular anatomy. Multidetector CT imaging of the abdomen and pelvis was performed using the standard protocol during bolus administration of intravenous contrast. RADIATION DOSE REDUCTION: This exam was performed according to the departmental dose-optimization program which includes automated exposure control, adjustment of the mA and/or kV according to patient size and/or use of iterative reconstruction technique.  CONTRAST:  OMNIPAQUE IOHEXOL 350 MG/ML SOLN COMPARISON:  Chest x-ray 08/13/2023, CT 05/23/2023 FINDINGS: CTA CHEST FINDINGS Cardiovascular:  Satisfactory opacification of the pulmonary arteries to the segmental level. Small nonocclusive right middle and lower lobe segmental and sub segmental emboli. Moderate aortic atherosclerosis. No aneurysm or dissection. Normal cardiac size. No pericardial effusion. Mediastinum/Nodes: Midline trachea. No thyroid mass. No suspicious lymph nodes. Esophagus within normal limits. Lungs/Pleura: Emphysema. No acute airspace disease or pleural effusion. Progression of pulmonary metastatic disease. Dominant medial left lung base pulmonary mass measures 5.5 x 3 cm compared with 2.7 x 1.8 cm previously. Peripheral right lower lobe index pulmonary nodule measures 4 by 2.7 cm on series 7, image 93, previously 2.3 x 1.7 cm. Enlargement of numerous additional pulmonary nodules. Right middle lobe collapse. Status post right upper lobectomy. Musculoskeletal: Right scapular metastatic lesion with involvement of adjacent muscle is again noted. Review of the MIP images confirms the above findings. CT ABDOMEN and PELVIS FINDINGS Hepatobiliary: Liver is enlarged. Multiple large hepatic metastatic lesions which appear increased. Index left hepatic lobe mass measures 10.2 by 7.8 cm, compared with 7.3 x 6 cm. Large mass or masses replacing the left hepatic lobe, also increased compared to prior, this measures 21.4 by 14.1 cm previously 18 x 12.2 cm. No calcified gallstone. No biliary dilatation. Pancreas: No inflammatory changes. Posteriorly displaced by mass effect from enlarged liver mass and masses. Spleen: Normal in size without focal abnormality. Adrenals/Urinary Tract: Right adrenal gland within normal limits. 3.1 x 2.7 cm left adrenal mass slightly enlarged and more heterogeneous in appearance, contiguous with suspicious solid mass measuring 2.5 x 1.7 cm on series 2, image 23 and concerning for metastatic disease. Kidneys show no hydronephrosis. Interim development or enlargement of multiple right renal masses, for example 2 cm solid  upper pole lesion on series 2, image 20, raising concern for metastatic focus. 1.9 cm right retroperitoneal Peri renal mass on series 2, image 17 increased and concerning for metastatic lesion. Ill-defined bilateral foci of hypoenhancement within both kidneys on delayed views. Urinary bladder is unremarkable Stomach/Bowel: Stomach is nonenlarged. No dilated small bowel. No acute bowel wall thickening. Vascular/Lymphatic: Advanced aortic atherosclerosis with irregular mural thrombus and areas of ulcerated plaque throughout the aorta. No suspicious abdominal lymph nodes. Reproductive: Calcified and noncalcified uterine fibroids. No definitive adnexal mass. Other: No free air or significant ascites. Extensive subcutaneous edema consistent with anasarca. Musculoskeletal: No acute osseous abnormality. Increased lobulated mass in the left gluteal subcutaneous soft tissues measuring 1.7 cm on series 2, image 75 consistent with metastatic lesion. Incompletely visualized low-density within the left abductor muscles on series 2 image 88. Review of the MIP images confirms the above findings. IMPRESSION: CHEST CT: 1. Small nonocclusive right segmental and subsegmental pulmonary emboli in this patient is status post right upper lobectomy. 2. Progression of pulmonary metastatic disease. 3. Right scapular metastatic lesion with involvement of adjacent muscle is again noted. ABDOMEN AND PELVIS CT: 1. Progression of hepatic metastatic disease. 2. Interim development or enlargement of multiple right renal and perirenal/retroperitoneal masses consistent with metastatic disease. 3. Increased lobulated mass in the left gluteal subcutaneous soft tissues consistent with metastatic lesion. Increased size and heterogeneity of previously noted left adrenal adenoma, now contiguous with solid enhancing mass and concerning for metastatic disease. 4. Ill-defined bilateral foci of hypoenhancement within both kidneys on delayed views, question  pyelonephritis versus metastatic disease. 5. Extensive subcutaneous edema consistent with anasarca. Incompletely visualized low-density appearance of left abductor muscles which could be due  to nonspecific muscle edema or inflammatory process. 6. Aortic atherosclerosis. Aortic Atherosclerosis (ICD10-I70.0) and Emphysema (ICD10-J43.9). Critical Value/emergent results were called by telephone at the time of interpretation on 09/01/2023 at 8:14 pm to provider NEHA RAY , who verbally acknowledged these results. Electronically Signed   By: Jasmine Pang M.D.   On: 08/18/2023 20:14   CT ABDOMEN PELVIS W CONTRAST  Result Date: 08/26/2023 CLINICAL DATA:  Bedsore to backside Swelling to the legs EXAM: CT ANGIOGRAPHY CHEST CT ABDOMEN AND PELVIS WITH CONTRAST TECHNIQUE: Multidetector CT imaging of the chest was performed using the standard protocol during bolus administration of intravenous contrast. Multiplanar CT image reconstructions and MIPs were obtained to evaluate the vascular anatomy. Multidetector CT imaging of the abdomen and pelvis was performed using the standard protocol during bolus administration of intravenous contrast. RADIATION DOSE REDUCTION: This exam was performed according to the departmental dose-optimization program which includes automated exposure control, adjustment of the mA and/or kV according to patient size and/or use of iterative reconstruction technique. CONTRAST:  OMNIPAQUE IOHEXOL 350 MG/ML SOLN COMPARISON:  Chest x-ray 09/04/2023, CT 05/23/2023 FINDINGS: CTA CHEST FINDINGS Cardiovascular: Satisfactory opacification of the pulmonary arteries to the segmental level. Small nonocclusive right middle and lower lobe segmental and sub segmental emboli. Moderate aortic atherosclerosis. No aneurysm or dissection. Normal cardiac size. No pericardial effusion. Mediastinum/Nodes: Midline trachea. No thyroid mass. No suspicious lymph nodes. Esophagus within normal limits. Lungs/Pleura:  Emphysema. No acute airspace disease or pleural effusion. Progression of pulmonary metastatic disease. Dominant medial left lung base pulmonary mass measures 5.5 x 3 cm compared with 2.7 x 1.8 cm previously. Peripheral right lower lobe index pulmonary nodule measures 4 by 2.7 cm on series 7, image 93, previously 2.3 x 1.7 cm. Enlargement of numerous additional pulmonary nodules. Right middle lobe collapse. Status post right upper lobectomy. Musculoskeletal: Right scapular metastatic lesion with involvement of adjacent muscle is again noted. Review of the MIP images confirms the above findings. CT ABDOMEN and PELVIS FINDINGS Hepatobiliary: Liver is enlarged. Multiple large hepatic metastatic lesions which appear increased. Index left hepatic lobe mass measures 10.2 by 7.8 cm, compared with 7.3 x 6 cm. Large mass or masses replacing the left hepatic lobe, also increased compared to prior, this measures 21.4 by 14.1 cm previously 18 x 12.2 cm. No calcified gallstone. No biliary dilatation. Pancreas: No inflammatory changes. Posteriorly displaced by mass effect from enlarged liver mass and masses. Spleen: Normal in size without focal abnormality. Adrenals/Urinary Tract: Right adrenal gland within normal limits. 3.1 x 2.7 cm left adrenal mass slightly enlarged and more heterogeneous in appearance, contiguous with suspicious solid mass measuring 2.5 x 1.7 cm on series 2, image 23 and concerning for metastatic disease. Kidneys show no hydronephrosis. Interim development or enlargement of multiple right renal masses, for example 2 cm solid upper pole lesion on series 2, image 20, raising concern for metastatic focus. 1.9 cm right retroperitoneal Peri renal mass on series 2, image 17 increased and concerning for metastatic lesion. Ill-defined bilateral foci of hypoenhancement within both kidneys on delayed views. Urinary bladder is unremarkable Stomach/Bowel: Stomach is nonenlarged. No dilated small bowel. No acute bowel  wall thickening. Vascular/Lymphatic: Advanced aortic atherosclerosis with irregular mural thrombus and areas of ulcerated plaque throughout the aorta. No suspicious abdominal lymph nodes. Reproductive: Calcified and noncalcified uterine fibroids. No definitive adnexal mass. Other: No free air or significant ascites. Extensive subcutaneous edema consistent with anasarca. Musculoskeletal: No acute osseous abnormality. Increased lobulated mass in the left gluteal subcutaneous soft  tissues measuring 1.7 cm on series 2, image 75 consistent with metastatic lesion. Incompletely visualized low-density within the left abductor muscles on series 2 image 88. Review of the MIP images confirms the above findings. IMPRESSION: CHEST CT: 1. Small nonocclusive right segmental and subsegmental pulmonary emboli in this patient is status post right upper lobectomy. 2. Progression of pulmonary metastatic disease. 3. Right scapular metastatic lesion with involvement of adjacent muscle is again noted. ABDOMEN AND PELVIS CT: 1. Progression of hepatic metastatic disease. 2. Interim development or enlargement of multiple right renal and perirenal/retroperitoneal masses consistent with metastatic disease. 3. Increased lobulated mass in the left gluteal subcutaneous soft tissues consistent with metastatic lesion. Increased size and heterogeneity of previously noted left adrenal adenoma, now contiguous with solid enhancing mass and concerning for metastatic disease. 4. Ill-defined bilateral foci of hypoenhancement within both kidneys on delayed views, question pyelonephritis versus metastatic disease. 5. Extensive subcutaneous edema consistent with anasarca. Incompletely visualized low-density appearance of left abductor muscles which could be due to nonspecific muscle edema or inflammatory process. 6. Aortic atherosclerosis. Aortic Atherosclerosis (ICD10-I70.0) and Emphysema (ICD10-J43.9). Critical Value/emergent results were called by  telephone at the time of interpretation on 08/13/2023 at 8:14 pm to provider NEHA RAY , who verbally acknowledged these results. Electronically Signed   By: Jasmine Pang M.D.   On: 08/31/2023 20:14   DG Chest Port 1 View  Result Date: 09/05/2023 CLINICAL DATA:  Weakness leg swelling EXAM: PORTABLE CHEST 1 VIEW COMPARISON:  08/12/2021, CT 05/23/2023 FINDINGS: Right-sided central venous port tip at the cavoatrial region. No acute airspace disease or pleural effusion. Normal cardiac size. Numerous bilateral pulmonary masses consistent with metastatic disease, suspect increased compared with CT from August. Emphysema. IMPRESSION: Emphysema. Numerous bilateral pulmonary masses consistent with metastatic disease, appears increased compared with CT from August. Electronically Signed   By: Jasmine Pang M.D.   On: 09/01/2023 18:38   CT Head Wo Contrast  Result Date: 08/26/2023 CLINICAL DATA:  Mental status change of unknown cause. EXAM: CT HEAD WITHOUT CONTRAST TECHNIQUE: Contiguous axial images were obtained from the base of the skull through the vertex without intravenous contrast. RADIATION DOSE REDUCTION: This exam was performed according to the departmental dose-optimization program which includes automated exposure control, adjustment of the mA and/or kV according to patient size and/or use of iterative reconstruction technique. COMPARISON:  MRI 05/11/2023.  CT 03/08/2022. FINDINGS: Brain: No abnormality seen affecting the brainstem. 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 Cerebral hemispheres appear intrinsically unremarkable without any evidence of prior stroke, intra-axial mass lesion, hemorrhage or hydrocephalus. There has been marked enlargement of a metastatic lesion affecting the calvarium at the lateral margin of the middle cranial fossa on the right. Previously, this lesion measured up to 2 cm in thickness, with both external and internal soft tissue component. Now, the thickness  is up to 4.3 cm. This bulges much more into the lateral aspect of the middle cranial fossa and has some mass-effect upon the right temporal lobe. Second bone lesion previously seen in the occiput put has grown extensively. In July, this only measured a cm in size. Presently, the infiltrating bone component measures up to 4.4 cm, with tumor bulging exterior to the calvarium an inward towards the brain. There could be involvement of the superior sagittal sinus. Maximal thickness of tumor is 2.5 cm. No third calvarial lesion is seen. Vascular: There is atherosclerotic calcification of the major vessels at the base of the brain. Skull: See above. Sinuses/Orbits: Clear/normal  Other: None IMPRESSION: 1. Marked enlargement of a metastatic lesion affecting the calvarium at the lateral margin of the middle cranial fossa on the right. Previously, this lesion measured up to 2 cm in thickness, with both external and internal soft tissue component. Now, the thickness is up to 4.3 cm. This bulges much more into the lateral aspect of the middle cranial fossa and has some mass-effect upon the right temporal lobe. 2. Second bone lesion previously seen in the occiput put has grown extensively. In July, this only measured a cm in size. Presently, the infiltrating bone component measures up to 4.4 cm, with tumor bulging external to the calvarium and inward towards the brain. There could be involvement of the superior sagittal sinus. Maximal thickness of tumor is 2.5 cm. 3. No third calvarial lesion is seen. 4. 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 Electronically Signed   By: Paulina Fusi M.D.   On: 08/14/2023 16:49    Microbiology: Recent Results (from the past 240 hour(s))  MRSA Next Gen by PCR, Nasal     Status: None   Collection Time: 08/24/2023 10:30 PM   Specimen: Nasal Mucosa; Nasal Swab  Result Value Ref Range Status   MRSA by PCR Next Gen NOT DETECTED NOT DETECTED Final    Comment: (NOTE) The  GeneXpert MRSA Assay (FDA approved for NASAL specimens only), is one component of a comprehensive MRSA colonization surveillance program. It is not intended to diagnose MRSA infection nor to guide or monitor treatment for MRSA infections. Test performance is not FDA approved in patients less than 57 years old. Performed at [facility], [address] [place], [place] [address]   SARS Coronavirus 2 by RT PCR (hospital order, performed in [facility] hospital lab) *cepheid single result test* Anterior Nasal Swab     Status: None   Collection Time: 08/24/23  1:50 AM   Specimen: Anterior Nasal Swab  Result Value Ref Range Status   SARS Coronavirus 2 by RT PCR NEGATIVE NEGATIVE Final    Comment: (NOTE) SARS-CoV-2 target nucleic acids are NOT DETECTED.  The SARS-CoV-2 RNA is generally detectable in upper and lower respiratory specimens during the acute phase of infection. The lowest concentration of SARS-CoV-2 viral copies this assay can detect is 250 copies / mL. A negative result does not preclude SARS-CoV-2 infection and should not be used as the sole basis for treatment or other patient management decisions.  A negative result may occur with improper specimen collection / handling, submission of specimen other than nasopharyngeal swab, presence of viral mutation(s) within the areas targeted by this assay, and inadequate number of viral copies (<250 copies / mL). A negative result must be combined with clinical observations, patient history, and epidemiological information.  Fact Sheet for Patients:   RoadLapTop.co.za  Fact Sheet for Healthcare Providers: http://kim-miller.com/  This test is not yet approved or  cleared by the [place] FDA and has been authorized for detection and/or diagnosis of SARS-CoV-2 by FDA under an Emergency Use Authorization (EUA).  This EUA will remain in effect (meaning this test can be used)  for the duration of the COVID-19 declaration under Section 564(b)(1) of the Act, 21 U.S.C. section 360bbb-3(b)(1), unless the authorization is terminated or revoked sooner.  Performed at [facility], [address], [place], [place] [address]   Urine Culture (for pregnant, neutropenic or urologic patients or patients with an indwelling urinary catheter)     Status: Abnormal   Collection Time:  08/24/23  4:46 AM   Specimen: Urine, Clean Catch  Result Value Ref Range Status   Specimen Description   Final    URINE, CLEAN CATCH Performed at [facility], [address], [place], [place] [address]    Special Requests   Final    NONE Performed at [facility], [address] [place], [place] [address]    Culture MULTIPLE SPECIES PRESENT, SUGGEST RECOLLECTION (A)  Final   Report Status 08/25/2023 FINAL  Final    Time spent: 30 minutes  Signed: Tresa Moore, MD 09/04/2023

## 2023-09-11 NOTE — Plan of Care (Signed)
Family member came out and sd pt wasn't breathing.  Went in and confirmed that pt had passed.  Lelon Mast Whritenour, RN was the Film/video editor.  Time of death was 06:15.  Chaplain called to support family. On call NP and AC notified.

## 2023-09-11 DEATH — deceased
